# Patient Record
Sex: Male | Born: 1945 | Race: White | Hispanic: No | Marital: Married | Smoking: Former smoker
Health system: Southern US, Community
[De-identification: ages and names within clinical notes are randomized; demographics above are authoritative.]

## PROBLEM LIST (undated history)

## (undated) ENCOUNTER — Emergency Department (HOSPITAL_BASED_OUTPATIENT_CLINIC_OR_DEPARTMENT_OTHER)

## (undated) DIAGNOSIS — T4145XA Adverse effect of unspecified anesthetic, initial encounter: Secondary | ICD-10-CM

## (undated) DIAGNOSIS — G894 Chronic pain syndrome: Secondary | ICD-10-CM

## (undated) DIAGNOSIS — Z9989 Dependence on other enabling machines and devices: Secondary | ICD-10-CM

## (undated) DIAGNOSIS — H9190 Unspecified hearing loss, unspecified ear: Secondary | ICD-10-CM

## (undated) DIAGNOSIS — I2510 Atherosclerotic heart disease of native coronary artery without angina pectoris: Secondary | ICD-10-CM

## (undated) DIAGNOSIS — R251 Tremor, unspecified: Secondary | ICD-10-CM

## (undated) DIAGNOSIS — N3001 Acute cystitis with hematuria: Secondary | ICD-10-CM

## (undated) DIAGNOSIS — I10 Essential (primary) hypertension: Secondary | ICD-10-CM

## (undated) DIAGNOSIS — N183 Chronic kidney disease, stage 3 unspecified: Secondary | ICD-10-CM

## (undated) DIAGNOSIS — Z950 Presence of cardiac pacemaker: Secondary | ICD-10-CM

## (undated) DIAGNOSIS — I679 Cerebrovascular disease, unspecified: Secondary | ICD-10-CM

## (undated) DIAGNOSIS — J189 Pneumonia, unspecified organism: Secondary | ICD-10-CM

## (undated) DIAGNOSIS — R600 Localized edema: Secondary | ICD-10-CM

## (undated) DIAGNOSIS — R5383 Other fatigue: Secondary | ICD-10-CM

## (undated) DIAGNOSIS — G4733 Obstructive sleep apnea (adult) (pediatric): Secondary | ICD-10-CM

## (undated) DIAGNOSIS — M1710 Unilateral primary osteoarthritis, unspecified knee: Secondary | ICD-10-CM

## (undated) DIAGNOSIS — R519 Headache, unspecified: Secondary | ICD-10-CM

## (undated) DIAGNOSIS — E119 Type 2 diabetes mellitus without complications: Secondary | ICD-10-CM

## (undated) DIAGNOSIS — I447 Left bundle-branch block, unspecified: Secondary | ICD-10-CM

## (undated) DIAGNOSIS — J42 Unspecified chronic bronchitis: Secondary | ICD-10-CM

## (undated) DIAGNOSIS — Z789 Other specified health status: Secondary | ICD-10-CM

## (undated) DIAGNOSIS — Z87442 Personal history of urinary calculi: Secondary | ICD-10-CM

## (undated) DIAGNOSIS — I200 Unstable angina: Secondary | ICD-10-CM

## (undated) DIAGNOSIS — I5042 Chronic combined systolic (congestive) and diastolic (congestive) heart failure: Secondary | ICD-10-CM

## (undated) DIAGNOSIS — R748 Abnormal levels of other serum enzymes: Secondary | ICD-10-CM

## (undated) DIAGNOSIS — I499 Cardiac arrhythmia, unspecified: Secondary | ICD-10-CM

## (undated) DIAGNOSIS — I255 Ischemic cardiomyopathy: Secondary | ICD-10-CM

## (undated) DIAGNOSIS — M75100 Unspecified rotator cuff tear or rupture of unspecified shoulder, not specified as traumatic: Secondary | ICD-10-CM

## (undated) DIAGNOSIS — I495 Sick sinus syndrome: Secondary | ICD-10-CM

## (undated) DIAGNOSIS — M179 Osteoarthritis of knee, unspecified: Secondary | ICD-10-CM

## (undated) DIAGNOSIS — I872 Venous insufficiency (chronic) (peripheral): Secondary | ICD-10-CM

## (undated) DIAGNOSIS — H269 Unspecified cataract: Secondary | ICD-10-CM

## (undated) DIAGNOSIS — R51 Headache: Secondary | ICD-10-CM

## (undated) DIAGNOSIS — N200 Calculus of kidney: Secondary | ICD-10-CM

## (undated) DIAGNOSIS — Z86010 Personal history of colonic polyps: Secondary | ICD-10-CM

## (undated) DIAGNOSIS — I219 Acute myocardial infarction, unspecified: Secondary | ICD-10-CM

## (undated) DIAGNOSIS — T8859XA Other complications of anesthesia, initial encounter: Secondary | ICD-10-CM

## (undated) DIAGNOSIS — B9681 Helicobacter pylori [H. pylori] as the cause of diseases classified elsewhere: Secondary | ICD-10-CM

## (undated) DIAGNOSIS — Z9289 Personal history of other medical treatment: Secondary | ICD-10-CM

## (undated) DIAGNOSIS — M1990 Unspecified osteoarthritis, unspecified site: Secondary | ICD-10-CM

## (undated) DIAGNOSIS — I480 Paroxysmal atrial fibrillation: Secondary | ICD-10-CM

## (undated) DIAGNOSIS — G5600 Carpal tunnel syndrome, unspecified upper limb: Secondary | ICD-10-CM

## (undated) DIAGNOSIS — T7840XA Allergy, unspecified, initial encounter: Secondary | ICD-10-CM

## (undated) DIAGNOSIS — K2970 Gastritis, unspecified, without bleeding: Secondary | ICD-10-CM

## (undated) DIAGNOSIS — E785 Hyperlipidemia, unspecified: Secondary | ICD-10-CM

## (undated) DIAGNOSIS — E6609 Other obesity due to excess calories: Secondary | ICD-10-CM

## (undated) DIAGNOSIS — J45909 Unspecified asthma, uncomplicated: Secondary | ICD-10-CM

## (undated) DIAGNOSIS — I509 Heart failure, unspecified: Secondary | ICD-10-CM

## (undated) DIAGNOSIS — Z7901 Long term (current) use of anticoagulants: Secondary | ICD-10-CM

## (undated) DIAGNOSIS — D509 Iron deficiency anemia, unspecified: Secondary | ICD-10-CM

## (undated) HISTORY — DX: Chronic combined systolic (congestive) and diastolic (congestive) heart failure: I50.42

## (undated) HISTORY — DX: Acute myocardial infarction, unspecified: I21.9

## (undated) HISTORY — DX: Chronic pain syndrome: G89.4

## (undated) HISTORY — DX: Other specified health status: Z78.9

## (undated) HISTORY — DX: Paroxysmal atrial fibrillation: I48.0

## (undated) HISTORY — DX: Acute cystitis with hematuria: N30.01

## (undated) HISTORY — PX: KNEE ARTHROSCOPY: SHX127

## (undated) HISTORY — DX: Atherosclerotic heart disease of native coronary artery without angina pectoris: I25.10

## (undated) HISTORY — DX: Helicobacter pylori (H. pylori) as the cause of diseases classified elsewhere: B96.81

## (undated) HISTORY — PX: COLONOSCOPY: SHX174

## (undated) HISTORY — PX: UPPER GASTROINTESTINAL ENDOSCOPY: SHX188

## (undated) HISTORY — DX: Osteoarthritis of knee, unspecified: M17.9

## (undated) HISTORY — DX: Other fatigue: R53.83

## (undated) HISTORY — DX: Gastritis, unspecified, without bleeding: K29.70

## (undated) HISTORY — DX: Essential (primary) hypertension: I10

## (undated) HISTORY — DX: Long term (current) use of anticoagulants: Z79.01

## (undated) HISTORY — DX: Sick sinus syndrome: I49.5

## (undated) HISTORY — DX: Venous insufficiency (chronic) (peripheral): I87.2

## (undated) HISTORY — DX: Cerebrovascular disease, unspecified: I67.9

## (undated) HISTORY — DX: Localized edema: R60.0

## (undated) HISTORY — DX: Unspecified cataract: H26.9

## (undated) HISTORY — DX: Left bundle-branch block, unspecified: I44.7

## (undated) HISTORY — DX: Hyperlipidemia, unspecified: E78.5

## (undated) HISTORY — DX: Type 2 diabetes mellitus without complications: E11.9

## (undated) HISTORY — DX: Calculus of kidney: N20.0

## (undated) HISTORY — DX: Personal history of colonic polyps: Z86.010

## (undated) HISTORY — DX: Tremor, unspecified: R25.1

## (undated) HISTORY — DX: Allergy, unspecified, initial encounter: T78.40XA

## (undated) HISTORY — DX: Obstructive sleep apnea (adult) (pediatric): G47.33

## (undated) HISTORY — DX: Other obesity due to excess calories: E66.09

## (undated) HISTORY — PX: CORONARY ANGIOPLASTY WITH STENT PLACEMENT: SHX49

## (undated) HISTORY — DX: Unilateral primary osteoarthritis, unspecified knee: M17.10

## (undated) HISTORY — DX: Abnormal levels of other serum enzymes: R74.8

---
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
## 1978-11-22 HISTORY — PX: KNEE CARTILAGE SURGERY: SHX688

## 1998-04-03 ENCOUNTER — Encounter: Admission: RE | Admit: 1998-04-03 | Discharge: 1998-07-02 | Payer: Self-pay | Admitting: Cardiovascular Disease

## 2000-02-05 ENCOUNTER — Encounter: Payer: Self-pay | Admitting: Family Medicine

## 2000-02-05 ENCOUNTER — Encounter: Admission: RE | Admit: 2000-02-05 | Discharge: 2000-02-05 | Payer: Self-pay | Admitting: Family Medicine

## 2000-10-07 ENCOUNTER — Encounter: Admission: RE | Admit: 2000-10-07 | Discharge: 2000-10-07 | Payer: Self-pay | Admitting: Family Medicine

## 2000-10-07 ENCOUNTER — Encounter: Payer: Self-pay | Admitting: Family Medicine

## 2001-07-25 ENCOUNTER — Inpatient Hospital Stay (HOSPITAL_COMMUNITY): Admission: EM | Admit: 2001-07-25 | Discharge: 2001-07-27 | Payer: Self-pay | Admitting: Emergency Medicine

## 2001-08-04 ENCOUNTER — Emergency Department (HOSPITAL_COMMUNITY): Admission: EM | Admit: 2001-08-04 | Discharge: 2001-08-04 | Payer: Self-pay | Admitting: Emergency Medicine

## 2001-08-04 ENCOUNTER — Encounter: Payer: Self-pay | Admitting: Emergency Medicine

## 2001-08-06 ENCOUNTER — Emergency Department (HOSPITAL_COMMUNITY): Admission: EM | Admit: 2001-08-06 | Discharge: 2001-08-06 | Payer: Self-pay | Admitting: Emergency Medicine

## 2001-08-06 ENCOUNTER — Encounter: Payer: Self-pay | Admitting: Emergency Medicine

## 2002-07-31 ENCOUNTER — Encounter: Admission: RE | Admit: 2002-07-31 | Discharge: 2002-07-31 | Payer: Self-pay | Admitting: Urology

## 2002-07-31 ENCOUNTER — Encounter: Payer: Self-pay | Admitting: Urology

## 2006-09-22 DIAGNOSIS — Z86010 Personal history of colonic polyps: Secondary | ICD-10-CM

## 2006-09-22 HISTORY — DX: Personal history of colonic polyps: Z86.010

## 2006-09-26 ENCOUNTER — Encounter: Payer: Self-pay | Admitting: Internal Medicine

## 2007-09-25 ENCOUNTER — Ambulatory Visit (HOSPITAL_COMMUNITY): Admission: RE | Admit: 2007-09-25 | Discharge: 2007-09-26 | Payer: Self-pay | Admitting: Cardiovascular Disease

## 2008-01-02 ENCOUNTER — Ambulatory Visit: Payer: Self-pay | Admitting: Internal Medicine

## 2008-01-03 DIAGNOSIS — E785 Hyperlipidemia, unspecified: Secondary | ICD-10-CM | POA: Insufficient documentation

## 2008-01-03 DIAGNOSIS — Z87442 Personal history of urinary calculi: Secondary | ICD-10-CM | POA: Insufficient documentation

## 2008-01-03 DIAGNOSIS — E669 Obesity, unspecified: Secondary | ICD-10-CM

## 2008-01-03 DIAGNOSIS — I2510 Atherosclerotic heart disease of native coronary artery without angina pectoris: Secondary | ICD-10-CM | POA: Insufficient documentation

## 2008-01-03 DIAGNOSIS — I119 Hypertensive heart disease without heart failure: Secondary | ICD-10-CM | POA: Insufficient documentation

## 2008-01-03 DIAGNOSIS — IMO0002 Reserved for concepts with insufficient information to code with codable children: Secondary | ICD-10-CM | POA: Insufficient documentation

## 2008-01-03 DIAGNOSIS — M1710 Unilateral primary osteoarthritis, unspecified knee: Secondary | ICD-10-CM

## 2008-01-03 DIAGNOSIS — J449 Chronic obstructive pulmonary disease, unspecified: Secondary | ICD-10-CM | POA: Insufficient documentation

## 2008-01-03 HISTORY — DX: Obesity, unspecified: E66.9

## 2008-01-16 ENCOUNTER — Ambulatory Visit: Payer: Self-pay | Admitting: Internal Medicine

## 2008-03-21 ENCOUNTER — Telehealth (INDEPENDENT_AMBULATORY_CARE_PROVIDER_SITE_OTHER): Payer: Self-pay | Admitting: *Deleted

## 2008-04-21 ENCOUNTER — Emergency Department (HOSPITAL_COMMUNITY): Admission: EM | Admit: 2008-04-21 | Discharge: 2008-04-22 | Payer: Self-pay | Admitting: Emergency Medicine

## 2008-04-30 ENCOUNTER — Ambulatory Visit: Payer: Self-pay | Admitting: Internal Medicine

## 2008-05-07 ENCOUNTER — Ambulatory Visit (HOSPITAL_COMMUNITY): Admission: RE | Admit: 2008-05-07 | Discharge: 2008-05-07 | Payer: Self-pay | Admitting: Internal Medicine

## 2008-05-21 ENCOUNTER — Encounter: Payer: Self-pay | Admitting: Internal Medicine

## 2008-05-23 ENCOUNTER — Encounter: Payer: Self-pay | Admitting: Internal Medicine

## 2008-05-29 ENCOUNTER — Telehealth: Payer: Self-pay | Admitting: Internal Medicine

## 2008-07-22 ENCOUNTER — Telehealth: Payer: Self-pay | Admitting: Internal Medicine

## 2008-08-24 ENCOUNTER — Inpatient Hospital Stay (HOSPITAL_COMMUNITY): Admission: EM | Admit: 2008-08-24 | Discharge: 2008-08-29 | Payer: Self-pay | Admitting: Emergency Medicine

## 2008-10-05 ENCOUNTER — Observation Stay (HOSPITAL_COMMUNITY): Admission: EM | Admit: 2008-10-05 | Discharge: 2008-10-06 | Payer: Self-pay | Admitting: Emergency Medicine

## 2008-12-14 ENCOUNTER — Observation Stay (HOSPITAL_COMMUNITY): Admission: EM | Admit: 2008-12-14 | Discharge: 2008-12-14 | Payer: Self-pay | Admitting: *Deleted

## 2009-06-15 ENCOUNTER — Inpatient Hospital Stay (HOSPITAL_COMMUNITY): Admission: AD | Admit: 2009-06-15 | Discharge: 2009-06-17 | Payer: Self-pay | Admitting: Urology

## 2009-06-15 ENCOUNTER — Encounter: Payer: Self-pay | Admitting: Emergency Medicine

## 2009-06-26 ENCOUNTER — Observation Stay (HOSPITAL_COMMUNITY): Admission: RE | Admit: 2009-06-26 | Discharge: 2009-06-27 | Payer: Self-pay | Admitting: Urology

## 2009-08-21 ENCOUNTER — Encounter: Payer: Self-pay | Admitting: Internal Medicine

## 2010-02-16 ENCOUNTER — Ambulatory Visit: Payer: Self-pay | Admitting: Internal Medicine

## 2010-02-18 DIAGNOSIS — Z860101 Personal history of adenomatous and serrated colon polyps: Secondary | ICD-10-CM

## 2010-02-18 DIAGNOSIS — Z86010 Personal history of colonic polyps: Secondary | ICD-10-CM | POA: Insufficient documentation

## 2010-02-18 HISTORY — DX: Personal history of adenomatous and serrated colon polyps: Z86.0101

## 2010-12-22 NOTE — Procedures (Signed)
Summary: Griffith Citron MD  Griffith Citron MD   Imported By: Lester Calvert 02/23/2010 08:32:02  _____________________________________________________________________  External Attachment:    Type:   Image     Comment:   External Document

## 2010-12-22 NOTE — Assessment & Plan Note (Signed)
Summary: discuss colon on BT--ch.   History of Present Illness Visit Type: Follow-up Visit Primary GI MD: Stan Head MD [facility] Primary Provider: Reather Littler, MD Chief Complaint: Colon screening discussion History of Present Illness:   65 yo white man seen previously for dysphagia, he had an EGD and a barium swallow but was nor dilated due to ongoing Plavix use. The dysphagia has resolved. He also had a history of colon polyp but when he was last seen we did not have the colonoscopy and pathology report available. He has no gastrointestinal complaints today but is here to review prior (2007) colonoscopy findings.            Colonoscopy  Procedure date:  09/26/2006  Findings:      Polyp - 15 mm pedunculated hepatic flexure PATHOLOGY = inflammatory polyp  Comments:      Repeat colonoscopy in 5 years.   EGD  Procedure date:  01/16/2008  Findings:      1) 3 CM HIATAL HERNIA 2) ANTRAL GASTRITIS (EROSIONS), LIKELY FROM ASA. H. PYLORI BIOPSY TAKEN 2) OTHERWISE OK  Barium Swallow  Procedure date:  05/07/2008  Findings:      Small hiatal hernia Mild esophageal dysmotility  Procedures Next Due Date:    Colonoscopy: 09/2011   Current Medications (verified): 1)  Actoplus Met 15-850 Mg  Tabs (Pioglitazone Hcl-Metformin Hcl) .... Take 1 Tablet By Mouth Two Times A Day 2)  Glimepiride 4 Mg  Tabs (Glimepiride) .... Take 2 Tablets Before Supper 3)  Lisinopril 20 Mg  Tabs (Lisinopril) .... Take 1 Tablet By Mouth Once A Day 4)  Simvastatin 80 Mg  Tabs (Simvastatin) .... Take 1 Tablet By Mouth Once A Day 5)  Hydrochlorothiazide 25 Mg  Tabs (Hydrochlorothiazide) .... Take 1 Tablet By Mouth Once A Day 6)  Warfarin Sodium 5 Mg Tabs (Warfarin Sodium) .... Take Daily As Directed 7)  Cartia Xt 120 Mg Xr24h-Cap (Diltiazem Hcl Coated Beads) .... Once Daily 8)  Aspirin 325 Mg  Tabs (Aspirin) .... Take 1/2 Tablet By Mouth Once Daily 9)  Sotalol Hcl (Af) 120 Mg Tabs (Sotalol Hcl Af) ....  Two Times A Day  Allergies (verified): 1)  ! Codeine 2)  ! Terramycin  Past History:  Past Medical History: ? of SLEEP APNEA (ICD-780.57) NEPHROLITHIASIS, HX OF (ICD-V13.01) OSTEOARTHRITIS, KNEE (ICD-715.96) DYSLIPIDEMIA (ICD-272.4) DIABETES MELLITUS, TYPE II (ICD-250.00) OBESITY (ICD-278.00) BRONCHITIS, OBSTRUCTIVE CHRONIC (ICD-491.20) CORONARY ARTERY DISEASE, S/P PTCA (ICD-414.9) HYPERTENSION, BENIGN (ICD-401.1) COLON POLYP (INFLAMMATORY) 2007  Past Surgical History: Intestinal resection age 71 (?strep infection) Appendectomy age 69 Bilateral Knee surgery x 3 PTCA, multiple. Last 11/08 with drug-eluting stent x7 Pacemaker  Nephrolithiasis - cystoureteroscopy and extraction/lithotripsy Annabell Howells) 06/26/2009  Family History: Reviewed history from 04/30/2008 and no changes required. No FH of Colon Cancer ?Family History of Clotting disorder: Mother? Family History of Heart Disease: mother and uncles   Social History: Reviewed history from 04/30/2008 and no changes required. Occupation: Freight forwarder for [employer] , previously Systems developer Patient is a former smoker. -stopped age 65 Alcohol Use - yes-on occasion Daily Caffeine Use-2 cups Illicit Drug Use - no Patient gets regular exercise.  Review of Systems       The patient complains of allergy/sinus, arthritis/joint pain, back pain, heart rhythm changes, muscle pains/cramps, shortness of breath, sleeping problems, swelling of feet/legs, and urine leakage.    Vital Signs:  Patient profile:   65 year old male Height:      69 inches Weight:  304.38 pounds BMI:     45.11 Pulse rate:   72 / minute Pulse rhythm:   regular BP sitting:   134 / 72  (left arm) Cuff size:   large  Vitals Entered By: June McMurray CMA Duncan Dull) (February 16, 2010 9:20 AM)  Physical Exam  General:  obese.  NAD   Impression & Recommendations:  Problem # 1:  COLONIC POLYPS, HX OF (ICD-V12.72) Assessment Comment  Only Inflammatory polyp (15mm) removed 11/07 Since this was a larger right-sided polyp, even though not clearly pre-cancerous, recommend a repeat colonoscopy 09/2011, 5 years after removal. explained rationale and plans to patient.  Problem # 2:  OTHER DYSPHAGIA (ICD-787.29) Assessment: Improved resolved has had an EGD and barium swallow - "mild esophageal dysmotility" he is asymptomatic - nothing further at this time  Patient Instructions: 1)  You should have a repeat routine colonoscopy in Noveber 2012. 2)  If you develop rectal bleeding, change in bowels or other gastrointestinal problems before then let us know. 3)  Copy sent to : Burnell Blanks, MD, Susa Griffins, MD 4)  The medication list was reviewed and reconciled.  All changed / newly prescribed medications were explained.  A complete medication list was provided to the patient / caregiver.

## 2011-02-27 LAB — BASIC METABOLIC PANEL
BUN: 20 mg/dL (ref 6–23)
Chloride: 104 mEq/L (ref 96–112)
Creatinine, Ser: 1.11 mg/dL (ref 0.4–1.5)
Glucose, Bld: 160 mg/dL — ABNORMAL HIGH (ref 70–99)
Potassium: 4.4 mEq/L (ref 3.5–5.1)

## 2011-02-27 LAB — GLUCOSE, CAPILLARY
Glucose-Capillary: 101 mg/dL — ABNORMAL HIGH (ref 70–99)
Glucose-Capillary: 137 mg/dL — ABNORMAL HIGH (ref 70–99)
Glucose-Capillary: 141 mg/dL — ABNORMAL HIGH (ref 70–99)
Glucose-Capillary: 228 mg/dL — ABNORMAL HIGH (ref 70–99)

## 2011-02-28 LAB — CBC
Hemoglobin: 11.9 g/dL — ABNORMAL LOW (ref 13.0–17.0)
MCHC: 33.6 g/dL (ref 30.0–36.0)
MCV: 89.1 fL (ref 78.0–100.0)
RBC: 3.82 MIL/uL — ABNORMAL LOW (ref 4.22–5.81)
RBC: 3.88 MIL/uL — ABNORMAL LOW (ref 4.22–5.81)

## 2011-02-28 LAB — GLUCOSE, CAPILLARY
Glucose-Capillary: 125 mg/dL — ABNORMAL HIGH (ref 70–99)
Glucose-Capillary: 137 mg/dL — ABNORMAL HIGH (ref 70–99)
Glucose-Capillary: 138 mg/dL — ABNORMAL HIGH (ref 70–99)

## 2011-02-28 LAB — DIFFERENTIAL
Basophils Relative: 0 % (ref 0–1)
Eosinophils Relative: 1 % (ref 0–5)
Monocytes Absolute: 0.6 10*3/uL (ref 0.1–1.0)
Neutro Abs: 6.4 10*3/uL (ref 1.7–7.7)
Neutrophils Relative %: 82 % — ABNORMAL HIGH (ref 43–77)

## 2011-02-28 LAB — COMPREHENSIVE METABOLIC PANEL
ALT: 12 U/L (ref 0–53)
BUN: 18 mg/dL (ref 6–23)
CO2: 27 mEq/L (ref 19–32)
GFR calc non Af Amer: 37 mL/min — ABNORMAL LOW (ref >60)
Glucose, Bld: 146 mg/dL — ABNORMAL HIGH (ref 70–99)
Potassium: 4.4 mEq/L (ref 3.5–5.1)
Sodium: 136 mEq/L (ref 135–145)

## 2011-02-28 LAB — BASIC METABOLIC PANEL
CO2: 30 mEq/L (ref 19–32)
Calcium: 9.1 mg/dL (ref 8.4–10.5)
Creatinine, Ser: 1.41 mg/dL (ref 0.4–1.5)
GFR calc Af Amer: >60 mL/min (ref >60)
GFR calc non Af Amer: 51 mL/min — ABNORMAL LOW (ref >60)

## 2011-02-28 LAB — PROTIME-INR
INR: 5.9 (ref 0.00–1.49)
INR: 1.7 — ABNORMAL HIGH (ref 0.00–1.49)
INR: 6.2 (ref 0.00–1.49)
Prothrombin Time: 21 seconds — ABNORMAL HIGH (ref 11.6–15.2)

## 2011-02-28 LAB — URINALYSIS, ROUTINE W REFLEX MICROSCOPIC
Bilirubin Urine: NEGATIVE
Glucose, UA: NEGATIVE mg/dL
Hgb urine dipstick: NEGATIVE
Specific Gravity, Urine: 1.018 (ref 1.005–1.030)

## 2011-02-28 LAB — APTT
aPTT: 132 seconds — ABNORMAL HIGH (ref 24–37)
aPTT: 52 seconds — ABNORMAL HIGH (ref 24–37)

## 2011-02-28 LAB — PREPARE FRESH FROZEN PLASMA

## 2011-03-08 LAB — BASIC METABOLIC PANEL
BUN: 20 mg/dL (ref 6–23)
BUN: 22 mg/dL (ref 6–23)
Calcium: 8.8 mg/dL (ref 8.4–10.5)
Chloride: 102 mEq/L (ref 96–112)
GFR calc non Af Amer: >60 mL/min (ref >60)
GFR calc non Af Amer: >60 mL/min (ref >60)
Glucose, Bld: 103 mg/dL — ABNORMAL HIGH (ref 70–99)
Glucose, Bld: 122 mg/dL — ABNORMAL HIGH (ref 70–99)
Potassium: 4.2 mEq/L (ref 3.5–5.1)

## 2011-03-08 LAB — POCT CARDIAC MARKERS
CKMB, poc: 1.5 ng/mL (ref 1.0–8.0)
Troponin i, poc: <0.05 ng/mL (ref 0.00–0.09)

## 2011-03-08 LAB — POCT I-STAT, CHEM 8
Chloride: 105 mEq/L (ref 96–112)
HCT: 39 % (ref 39.0–52.0)
Potassium: 4.2 mEq/L (ref 3.5–5.1)

## 2011-03-08 LAB — CBC
HCT: 37.1 % — ABNORMAL LOW (ref 39.0–52.0)
HCT: 37.5 % — ABNORMAL LOW (ref 39.0–52.0)
MCV: 88.7 fL (ref 78.0–100.0)
Platelets: 172 10*3/uL (ref 150–400)
Platelets: 196 10*3/uL (ref 150–400)
RDW: 14.3 % (ref 11.5–15.5)
RDW: 14.4 % (ref 11.5–15.5)

## 2011-03-08 LAB — APTT
aPTT: 35 seconds (ref 24–37)
aPTT: 37 seconds (ref 24–37)

## 2011-03-08 LAB — TROPONIN I: Troponin I: 0.02 ng/mL (ref 0.00–0.06)

## 2011-03-08 LAB — PROTIME-INR: Prothrombin Time: 26.1 seconds — ABNORMAL HIGH (ref 11.6–15.2)

## 2011-03-08 LAB — CARDIAC PANEL(CRET KIN+CKTOT+MB+TROPI)
Total CK: 134 U/L (ref 7–232)
Troponin I: <0.01 ng/mL (ref 0.00–0.06)

## 2011-03-08 LAB — LIPID PANEL
LDL Cholesterol: 59 mg/dL (ref 0–99)
Triglycerides: 97 mg/dL (ref <150)

## 2011-04-06 NOTE — Consult Note (Signed)
NAME:  Charles [name], Charles [name] NO.:  [id number]   MEDICAL RECORD NO.:  [id number]          PATIENT TYPE:  INP   LOCATION:  1538                         FACILITY:  [facility]   PHYSICIAN:  Sheliah Mends, MD      DATE OF BIRTH:  [birth year]   DATE OF CONSULTATION:  06/15/2009  DATE OF DISCHARGE:                                 CONSULTATION   REQUESTING PHYSICIAN:  Vesta Mixer, M.D.   REASON FOR CONSULTATION:  Preoperative evaluation prior to urethral  stent placement.   HISTORY OF PRESENT ILLNESS:  Charles [name] is a 65 year old gentleman who  is followed by Dr. Alanda Amass for history of coronary artery disease,  paroxysmal atrial fibrillation, history of pacemaker placement, and  obstructive sleep apnea managed on CPAP.   Charles [name] has a history of known coronary artery disease with multiple  prior interventions including LAD stenting, obtuse marginal and  circumflex stenting as well as stent placement to the right coronary  artery.  His last cardiac catheterization was in October 2008 which  showed a high-grade proximal circumflex lesion.  This was stented with a  drug eluding Promus stent on September 25, 2007.  He has done quite well  without significant recurrence of his angina.  In addition, Charles [name]  has been diagnosed with sick sinus syndrome and paroxysmal atrial  fibrillation.  He underwent permanent pacemaker placement for  symptomatic bradycardia on December 31, 2008.  He has very little atrial  fibrillation burden but due to high CHADS2 score he has been managed on  anticoagulation with Coumadin for prevention of thromboembolic stroke.   In addition, Charles [name] has a history of obstructive sleep apnea and was  started recently on CPAP ventilation.  He has felt much better since  start of CPAP ventilation.   Charles [name] is now admitted to [facility]  with a diagnosis of  kidney and urethral stones for a planned urethral stent placement.  The  cardiology service was asked to help manage his anticoagulation issues  arising from his history of paroxysmal atrial fibrillation as well as  prior drug-eluding stent placement.   PAST MEDICAL HISTORY:  1. Coronary artery disease with history of multiple percutaneous      coronary intervention including LAD on left circumflex and right      coronary artery with last stent placement to the circumflex with a      drug eluding stent on September 25, 2007.  2. Paroxysmal atrial fibrillation.  3. Sick sinus syndrome, status post pacemaker placement on December 31, 2008.  4. Hypertension.  5. Obstructive sleep apnea on CPAP ventilation.  6. Type 2 diabetes mellitus.  7. Morbid obesity.  8. Hypertension.   OUTPATIENT MEDICATIONS:  1. Glyburide 8 mg at bedtime.  2. Lisinopril 5 mg p.o. daily.  3. Simvastatin 80 mg p.o. daily.  4. Hydrochlorothiazide 25 mg p.o. daily  5. Plavix 75 mg p.o. daily.  6. Coumadin as directed.  7. KCl 20 mEq. p.o. daily.  8. Metformin 750 mg p.o. daily  9. Cardizem CD 120 mg  p.o. daily.  10.Betapace 120 mg p.o. b.i.d.  11.Fish oil.  12.Vitamin C.   DEVICES:  Medtronic Zephyr XL implanted on December 31, 2008 with last  interrogation in May 2010 that showed small atrial fibrillation and  atrial flutter burden.   SOCIAL HISTORY:  Charles [name] is married and has one daughter.  He used to  work at the [employer] in [place] in the past and is now working at  an airport refueling planes.  He has a history of tobacco use 25 years  ago.   FAMILY HISTORY:  Noncontributory.   REVIEW OF SYSTEMS:  Primarily back pain, abdominal pain, nausea, and  vomiting.  No shortness of breath or chest pain.   PHYSICAL EXAMINATION:  GENERAL:  The patient is alert and oriented x3.  VITAL SIGNS:  Blood pressure 110/70, heart rate 70, respiratory rate 12,  temperature afebrile.  NECK:  Supple.  Normal JVP.  No carotid bruit.  CHEST/LUNGS:  Clear to auscultation  bilaterally.  No rales or wheezes.  HEART:  Regular rate and rhythm.  No rub, murmur, gallop.  ABDOMEN:  Soft, nontender, nondistended.  Positive bowel sounds.  EXTREMITIES:  No edema.   IMPRESSION:  1. Coronary artery disease, status post drug-eluding stent placement,      stable on aspirin and Plavix.  2. Hypertension, well-controlled.  3. Dyslipidemia.  4. Type 2 diabetes mellitus.  5. Obesity.  6. Paroxysmal atrial fibrillation and sick sinus syndrome, status post      pacemaker placement,, currently managed on Coumadin.  7. Obstructive sleep apnea on CPAP ventilation.   RECOMMENDATIONS:  Charles [name] has a low to intermediate risk for his  upcoming surgery.  In regards to his atrial fibrillation, his  anticoagulation with Coumadin will be reversed using vitamin K  injections.  He is currently in sinus rhythm.  This will reduce the  bleeding risk during surgery and should be overall safe.  Charles [name]  should be started on Coumadin at the earliest point post surgically.   In regards to his history of coronary artery disease and stent  placement,  I will hold dual antiplatelet therapy with aspirin and  Plavix.  Specifically Plavix will be held and the patient will be  continued on aspirin.  Given that his stent placement dates almost two  years back this should be a safe approach.   Charles [name] should be continued on CPAP ventilation and should be  monitored on telemetry after his surgery.   Should Charles [name] develop any fevers blood cultures should be obtained  given his history of device placement.  There is a low threshold for  antibiotic therapy should infection be suspected.     Thank you for this interesting consultation.  Please do not hesitate  to contact me should you have any questions or concerns.  We will follow  with you.  Thank you for allowing me to participate in the care of this  nice gentleman      Sheliah Mends, MD  Electronically Signed     JE/MEDQ   D:  06/15/2009  T:  06/15/2009  Job:  [id number]

## 2011-04-06 NOTE — Discharge Summary (Signed)
NAME:  Charles [name], SALTS NO.:  [id number]   MEDICAL RECORD NO.:  [id number]          PATIENT TYPE:  INP   LOCATION:  6533                         FACILITY:  [facility]   PHYSICIAN:  Lezlie Octave, N.P.     DATE OF BIRTH:  1946-01-17   DATE OF ADMISSION:  09/25/2007  DATE OF DISCHARGE:  09/26/2007                               DISCHARGE SUMMARY   HISTORY:  [name] is a 65 year old white married male patient who is  a patient of Dr. Susa Griffins.  He underwent a cardiac  catheterization on September 22, 2007 at [facility].  He had  progression of disease in the proximal AV grade circumflex with 85%  stenosis thought to be a cultured lesion causing him to have increased  anginal symptoms, thus, he came to the hospital on September 25, 2007 for  elective PCI of his new lesion.  He underwent 3.5x15 Promus DES stent to  his circumflex.  The following day on September 26, 2007, he was seen by  Dr. Tresa Endo.  His blood pressure was 104/50.  His potassium was 4.1, BUN  11, creatinine 1.0, hemoglobin 12.1, hematocrit 35.4.  CK was negative.  Troponin was 0.05.  He was felt to be stable for discharge and follow up  with Dr. Alanda Amass as an outpatient.  He was seen by cardiac rehab.  He  did not want cardiac rehab phase-II referral.   DISCHARGE MEDICATIONS:  1. Amaril 4 mg 2 every day.  2. HCTZ 12.5 mg every day.  3. Atenolol 25 mg every day.  4. Norvasc 10 mg every day.  5. Aspirin 81 mg every day.  6. Lisinopril 20 mg every day.  7. Simvastatin 80 mg every day.  8. Finasteride 5 mg every day.  9. Actos plus metformin 15/850 twice a day.  He is to hold that until      Wednesday night.  10.Plavix 75 mg every day.  11.Aspirin 81 mg two tabs daily.   DISCHARGE DIAGNOSES:  1. Progressive coronary artery disease status post cardiac catheter at      [facility] showing 85% lesion AVF groove circumflex.      This was a new lesion.  2. Prior atherosclerotic  cardiovascular disease with a history of      posterior myocardial infarction remote treated with emergency      percutaneous transluminal coronary angiography May 05, 1994.  He      had redilatation July 02, 1994.  He had recurrent angina in 1998      with high-speed rotational atherectomy of his mid-circumflex obtuse      marginal bifurcation with stenting of his left anterior descending,      all bare-metal stents.  In 2002, he had bare-metal stent to his      right coronary artery and diagonal III percutaneous transluminal      coronary angioplasty.  3. Morbid obesity.  4. Hyperlipidemia.  5. Adult-onset diabetes mellitus.  6. Benign prostatic hypertrophy.  7. Well preserved left ventricular function.      Lezlie Octave, [place].P.  BB/MEDQ  D:  09/26/2007  T:  09/26/2007  Job:  [id number]   cc:   Reather Littler, M.D.  Francisca December, M.D.

## 2011-04-06 NOTE — Op Note (Signed)
NAME:  Charles [name], Charles [name] NO.:  [id number]   MEDICAL RECORD NO.:  [id number]          PATIENT TYPE:  INP   LOCATION:  1421                         FACILITY:  [facility]   PHYSICIAN:  Excell Seltzer. Annabell Howells, M.D.    DATE OF BIRTH:  18-Feb-1946   DATE OF PROCEDURE:  06/16/2009  DATE OF DISCHARGE:                               OPERATIVE REPORT   PROCEDURE:  Cystoscopy, left retrograde pyelogram with interpretation  and insertion of left double-J stent.   PREOPERATIVE DIAGNOSIS:  Left proximal ureteral stone.   POSTOPERATIVE DIAGNOSIS:  Left proximal ureteral stone.   SURGEON:  Dr. Bjorn Pippin.   ANESTHESIA:  General.   DRAINS:  6-French x 26 cm double-J stent.   COMPLICATIONS:  None.   INDICATIONS:  [name] is a 65 year old white male with multiple  medical problems including atrial fibrillation that is managed with  Coumadin, who presented yesterday with left flank pain and was found to  have a large left proximal stone with hydronephrosis.  He is to undergo  stenting for relief of pain but will required delayed definitive  treatment because of his coagulation status prior to the procedure.  He  was given FFP which brought his INR down to approximately 2/   FINDINGS AND PROCEDURE:  The patient was taken operating room.  He  received 400 mg Cipro.  General anesthetic was induced.  He was placed  in lithotomy position.  His perineum and genitalia were prepped Betadine  solution.  He was draped in the usual sterile fashion.  Cystoscopy was  performed using 22-French scope and 12 and 70 degrees lenses.  Examination revealed a normal urethra.  The external sphincter was  intact.  The prostatic urethra ws short without obstruction.  Examination of the bladder revealed mild trabeculation.  There was some  blood in the dependent portion of the bladder.  Ureteral orifices were  unremarkable.  No tumors or stones were noted.   The left ureteral orifice was cannulated with 5-French  open-end catheter  and contrast was instilled.   Left retrograde pyelogram revealed a normal distal and mid ureter.  In  the proximal ureter there was a filling defect consistent with his 6 x  11 mm stone.  With further instillation of contrast, the stone flushed  back into the kidney.  The kidney was only mildly dilated.  There were  additional filling defects that were most consistent with small clots in  the renal pelvis and in the calyces.   After completion of the retrograde pyelogram a guidewire was placed to  the kidney and a 6-French 26 cm double-J stent was placed without  difficulty under fluoroscopic guidance to the kidney.  The wire was  removed leaving good coil in the kidney and good coil in the bladder.  Upon placing the stent, there was efflux of turbid bloody urine from the  left collecting system.   The bladder was drained.  The cystoscope was removed, the patient was  taken down from lithotomy position.  His anesthetic was reversed.  He  was removed to the recovery room in stable  condition.  There no  complications.      Excell Seltzer. Annabell Howells, M.D.  Electronically Signed     JJW/MEDQ  D:  06/16/2009  T:  06/17/2009  Job:  [id number]

## 2011-04-06 NOTE — Assessment & Plan Note (Signed)
[facility]                         GASTROENTEROLOGY OFFICE NOTE   CONN, TROMBETTA                   MRN:          [id number]  DATE:01/02/2008                            DOB:          [birth year]    REQUESTING PHYSICIAN:  Lianne Bushy, M.D.   REASON FOR CONSULTATION:  Dysphagia   ASSESSMENT:  A [age]-year-old white man with intermittent dysphagia,  who recently had a flare of dysphagia and severe heartburn symptoms.  He  takes Plavix for a drug eluting stent, and the drug eluting stent was  placed in November of 2008, so it is too early to hold his Plavix.   PLAN:  1. Investigate with upper GI endoscopy while he is on Plavix and      aspirin.  Biopsy would be reasonable.  Would not dilate his      esophagus due to the ongoing Plavix and aspirin.  2. Risks, benefits and indications have been explained.  He      understands and agrees to proceed.   HISTORY:  [age]-year-old white man who has had intermittent problems  with mashed potatoes, bread and solid food dysphagia problems.  It has  been intermittent.  He has not been on a proton pump inhibitor.  He does  not have underlying heartburn and indigestion.  However, about three  weeks ago, he was eating, noticed bloating, and some vague early satiety-  like symptoms, a lot belching and burning, and then he developed  problems where he felt like he could not swallow well, though I am not  sure he had a food impaction based upon the history.  He saw Dr. Purnell Shoemaker,  he got GI cocktail and he felt somewhat better.  He was given Protonix  for a week and the indigestion type symptoms subsided.  He has still had  these swallowing problems described above with a mid sternal sticking  point.   GASTROINTESTINAL REVIEW OF SYSTEMS:  Otherwise negative.   PAST MEDICAL HISTORY:  1. Colonoscopy 2007, Dr. Kinnie Scales.  Apparently he had one polyp and was      told to follow up in five years.  I do not  have those records.  2. Hypertension.  3. Myocardial infarction, coronary artery disease, most recently      having angioplasty with drug eluting stent November 2008.  He is a      patient of Dr. Kandis Cocking.  4. Chronic bronchitis.  5. Type 2 diabetes mellitus.  6. Obesity.  7. Dyslipidemia.  8. Osteoarthritis.  9. History of nephrolithiasis.  10.Sleep apnea.  11.Prior knee surgery.  12.Status post intestinal resection due to streptococcal infection age      51.  13.Appendectomy age 29.   FAMILY HISTORY:  Uncles and mother have heart disease.  Nephew has  alcoholism.  No colon cancer.   SOCIAL HISTORY:  Married.  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  One daughter.  No alcohol, tobacco or drugs except for  occasional social alcohol.   REVIEW OF SYSTEMS:  See my medical history form for full details.  He  has been somewhat tired  lately.  An overactive bladder is a problem as  well.   PHYSICAL EXAMINATION:  Reveals a well developed, well nourished, obese  white man.  Weight 293 pounds.  Pulse 78, blood pressure 120/62.  EYES:  Anicteric.  MOUTH:  Posterior pharynx free of lesions.  NECK:  Supple without thyromegaly or mass.  CHEST:  Clear, resonant.  HEART:  S1, S2.  No murmurs, gallops.  ABDOMEN:  Soft, obese, nontender.  No organomegaly or mass.  There is no  gynecomastia noted.  LYMPH AIC:  No neck or supraclavicular nodes.  Lower extremities how 1+ bilateral edema.  SKIN:  Warm and dry without rash.  PSYCH:  He is alert and oriented x3.  NEURO:  Cranial nerves II-XII intact.   I appreciate the opportunity to care for this patient.     Iva Boop, MD,FACG  Electronically Signed    CEG/MedQ  DD: 01/02/2008  DT: 01/03/2008  Job #: [id number]   cc:   Lianne Bushy, M.D.  Richard A. Alanda Amass, M.D.  Reather Littler, M.D.

## 2011-04-06 NOTE — H&P (Signed)
NAME:  Charles [name], Charles [name] NO.:  [id number]   MEDICAL RECORD NO.:  [id number]          PATIENT TYPE:  INP   LOCATION:  2926                         FACILITY:  [facility]   PHYSICIAN:  Nanetta Batty, M.D.   DATE OF BIRTH:  [birth year]   DATE OF ADMISSION:  10/05/2008  DATE OF DISCHARGE:                              HISTORY & PHYSICAL   HISTORY OF PRESENT ILLNESS:  Rapid irregular heart rate with shortness  of breath and jaw aching.   HISTORY OF PRESENT ILLNESS:  A 65 year old white married male,  cardiology patient of Dr. Alanda Amass, with a history of coronary disease  and multiple stents, also recent permanent transvenous pacemaker placed  October of 2009.  The patient has paroxysmal Afib and today he was  awakened from sleep at around 1:00 or 1:30 in the morning because of  increased heart rate. He was short of breath.  He felt weak and had some  aching in his jaws. He felt it was better when he finally got ready for  work, and then he called [place] at from his workplace complaining of this  his jaws aching, and he did feel as if he was not as uncomfortable as he  had  been, but he was weak and anxious and just uncomfortable with this  irregular rhythm.  He was instructed to come the emergency room which he  did. Here he was found to be in Aflutter.  He was given 20 mg IV  Cardizem. Blood pressure was stable at that time at 135 systolic, and he  slowed to a rate of 80 with intermittent pacing but continued in  Aflutter.  Then he developed increased chest pain, and his blood  pressure dropped to 81 systolic.  He was given 200 mL saline bolus with  improvement of his blood pressure to 120/79.  His discomfort had  essentially resolved at that point.  He was then admitted by Dr. Allyson Sabal  to the step-down unit and we placed him on IV amiodarone.  Will continue  his Coumadin.   PAST MEDICAL HISTORY:  Coronary disease with numerous stent placements.  Last cath had been in October  2008 by Dr. Alanda Amass with patent  bifurcation stent to the circumflex OM, patent LAD stent, and patent  proximal stent, although he did have a new 85% lesion of the proximal  circumflex at that point.  This has been stented by Dr. Tresa Endo with a DES  PROMUS stent. He had not had any recurrent angina and has done quite  well. Then on October 1 he developed atrial fib with rapid ventricular  response.  He was placed on IV Cardizem and then was cardioverted in the  hospital. During that cardioversion he had an associated 7-second pause  and then 2:1 heart block.  He then underwent permanent pacemaker  placement by Dr. Lynnea Ferrier which he tolerated well.  He had done quite  well, was last seen by Dr. Alanda Amass on October 16, but since that time  he has had occasional atrial fib that just has not lasted very long.  Pacemaker is a Engineer, water. Jude  zephyr XL DR and was stable on his last  interrogation in October, and there were no episodes of atrial fib at  that time.   The patient is also diabetic, hypertensive dyslipidemia.  Also the  patient does snore. His wife states he stops breathing at night, but he  has not had a sleep apnea study. He has kind of just ignored the  recommendations to do that up until this point.  He does complain of  with his atrial fib he will get occasionally lightheaded. Also if he  stands up too quickly he gets lightheaded, and he is still queasy since  he had the pacemaker placed.  He is diabetic and glucose has been  controlled.   OUTPATIENT MEDS:  Tenormin 25 mg twice a day, Actos Plus 50/850 b.i.d.  with meals, glimepiride i.e. Amaryl 8 mg with his supper, lisinopril 20  mg daily, simvastatin 80 mg h.s., HCTZ 25 mg daily, Plavix 75 mg daily,  Norvasc 10 mg daily, Coumadin 5 mg some days, other days 7.5. He does  not remember the exact dose currently and it is followed at [facility].   REVIEW OF SYSTEMS:  GENERAL:  No colds, fevers, weight loss  recently.  HEENT: No sinus infections.  No blurred vision. SKIN:  Without rashes.  PULMONARY:  Positive snoring and sleep apnea per his wife.  CARDIOVASCULAR:  As stated.  GI: No diarrhea, constipation or melena.  GU: No hematuria or dysuria.  MUSCULOSKELETAL: No complaints of pain in  his body currently. NEURO:  No syncope, just some lightheadedness if he  stands too quickly.  He does state that at home his blood pressures have  been running lower than usual.   ALLERGIES TO MEDICATIONS:  TERRAMYCIN AND CODEINE.   Please note also on medications, he has also had metformin 750 mg added  to his Actos Plus that he takes at supper time.   FAMILY HISTORY:  Not significant to this admission.   SOCIAL HISTORY:  He is married.  He works as a Air traffic controller  at [employer].  No tobacco use.   PHYSICAL EXAMINATION:  VITAL SIGNS: Today blood pressure was initially  138/62, dropped to 81 systolically and prior to leaving the ER was  120/79. Temperature 98.2.  Pulse between 116 and 134. Respirations 18-  22.  GENERAL:  Alert, oriented. white male in no acute distress, pleasant  affect.  SKIN: Warm and dry.  Brisk capillary refill.  HEENT: Normocephalic.  Sclerae are clear. Glasses in place.  NECK: Supple.  No JVD.  No obvious bruits.  HEART: S1-S2 irregularly irregular.  No obvious murmurs.  LUNGS: Clear without rales, rhonchi or wheezes.  ABDOMEN: Soft, nontender, positive bowel sounds.  Do not palpate liver,  spleen or masses.  LOWER EXTREMITIES: With a little edema up to 2+, right greater than  left, and decreased pedal pulses. NEURO:  Alert, oriented x3.  Follows  commands.   LABORATORY DATA:  Sodium 139, potassium 4.8, BUN 19, creatinine 1.18,  glucose 123.  LFTs were within normal.  Hemoglobin 13.4, hematocrit 40,  platelets 222 and WBC 7.1. INR was 2.5, magnesium 1.9.  CK was 161, MB  2.3, troponin I 0.01.   EKG was without acute changes, and chest x-ray with no  active disease.  His EKG is atrial flutter. initially rapid ventricular response, and  then it was slowing with paced beats.  But no acute ST changes.   IMPRESSION:  1. Atrial  fibrillation/flutter with rapid ventricular response.  2. Chest pain, rule out cardiac.  3. Known coronary disease with multiple stents to all three vessels.  4. Diabetes mellitus type 2, controlled with oral agents.  5. Recent permanent transvenous pacemaker for sick sinus syndrome.   PLAN:  We discontinued his IV Cardizem due to hypotension and we have  placed him on IV amiodarone with a bolus.  He will continue his Coumadin  during the hospitalization. Will do serial CK-MBs to rule out ischemia.  Will  continue his metformin as no plans for cardiac catheterization at this  point and continue blood pressure medications.  Hopefully the amiodarone  will convert him to sinus rhythm and prior to discharge will most likely  change his Norvasc to Cardizem for better control.  Additionally the  patient will need an outpatient sleep study for sleep apnea.      Darcella Gasman. Annie Paras, N.P.      Nanetta Batty, M.D.  Electronically Signed    LRI/MEDQ  D:  10/05/2008  T:  10/06/2008  Job:  [id number]   cc:   Gerlene Burdock A. Alanda Amass, M.D.  Reather Littler, M.D.  Nanetta Batty, M.D.  Lianne Bushy, M.D.

## 2011-04-06 NOTE — Discharge Summary (Signed)
NAME:  Charles [name], Charles [name] NO.:  [id number]   MEDICAL RECORD NO.:  [id number]          PATIENT TYPE:  INP   LOCATION:  2926                         FACILITY:  [facility]   PHYSICIAN:  Nanetta Batty, M.D.   DATE OF BIRTH:  1946-07-23   DATE OF ADMISSION:  10/05/2008  DATE OF DISCHARGE:  10/06/2008                               DISCHARGE SUMMARY   DISCHARGE DIAGNOSES:  1. Paroxysmal atrial fibrillation with rapid ventricular response,      resolved.  2. Chest pain including jaw pain secondary to tachycardia, negative      myocardial infarction.  3. Known coronary artery disease with multiple stents to all three      coronary arteries.  4. Diabetes mellitus type 2 controlled with oral agents and diet.  5. Recent permanent transvenous pacemaker for sick sinus syndrome,      stable.  6. Anticoagulation secondary to paroxysmal atrial      fibrillation/flutter.  7. Sleep apnea.   DISCHARGE CONDITION:  Improved.   PROCEDURES:  None.   DISCHARGE MEDICATIONS:  1. Actoplus Met 15/850 one twice a day with meals.  2. Amaryl i.e. glimepiride 4 mg two tablets to equal 8 mg before      supper.  3. Atenolol 25 mg twice a day.  4. Lisinopril 20 mg one daily.  5. Simvastatin 80 mg daily.  6. Hydrochlorothiazide 25 mg daily.  7. Plavix 75 mg one daily.  8. Metformin 750 mg one daily with the suppertime dose of Actoplus      Met.  9. Coumadin 5 mg one most days, 7.5 mg other days.  He did not bring      his regimen with him but he will continue that as he has been      therapeutic in the hospital on Coumadin.  10.Aspirin 81 mg daily.  11.Amlodipine 10 mg daily.  We have stopped.  12.Begin Cardizem CD 120 mg one daily instead of the amlodipine.   DISCHARGE INSTRUCTIONS:  1. May return to work October 07, 2008.  2. Low-sodium heart-healthy diabetic diet.  3. Activity as tolerated.  4. Follow up with Dr. Alanda Amass in 1-2 weeks in the office will call      with date and time.  5. We will call you to schedule your sleep study appointment.   HISTORY OF PRESENT ILLNESS:  A 65 year old white married male with  coronary artery disease including stents to the RCA, circumflex and LAD  most recently in September 25, 2007, with a drug-eluting stent and now  with a permanent transvenous pacemaker secondary to sick sinus syndrome  presented to [facility] on October 05, 2008 with rapid heart rate  that woke him from sleep.  He felt some jaw aching, fatigue, shortness  of breath.  Rate seemed to slow because he felt better but he went on to  work but he felt worse at work so we asked him to come to the emergency  room.  He does not feel well when he is in AFib flutter.  Heart rate was  120-130 on arrival and he was given  20 mg of IV Cardizem.  Blood  pressure was 135 systolic at that time.  His heart rate slowed to 80.  With intermittent pacing continued a flutter.  He then developed  increased chest pain, blood pressure dropped to 81 and was given 200 mL  of saline and blood pressure was back to 120/79, felt secondary to the  IV Cardizem.  He was to be placed on IV amiodarone but prior to that  been hung he converted to a sinus rhythm in the emergency room.  He was  never given the amiodarone.   He did well overnight maintaining sinus rhythm.  His Norvasc had been  discontinued and he was placed on Cardizem CD 120 daily  prior to his  discharge home.  The patient's cardiac enzymes were negative and was  felt his discomfort was related to his tachycardia.   By the morning of October 06, 2008, he was stable and no complaints.  Vital signs were stable.  He was felt ready for discharge home after  ambulating in the hall without trouble.  He will follow up with Dr.  Alanda Amass and he will decide at that point if he would like to do an  outpatient stress test.   We will have him see Dr. Alanda Amass in 1-2 weeks.   LABORATORY DATA:  Hemoglobin 13.4, hematocrit 40.7, WBC 7.1,  platelets  222, MCV 90.  These remained stable.  Prior to discharge, hemoglobin  12.2, hematocrit 36.7.  Chemistry, sodium 139, potassium 4.8, chloride  106, CO2 27, BUN 19, creatinine 1.18, glucose 123.  Coags on admission,  protime 28.5, INR 2.3, PTT 37.  LFTs were entirely normal.  Cardiac  enzymes were negative.  CK ranged 94-161.  MBs 1.6-2.3.  Troponin I less  than 0.01, calcium 8.9, magnesium 1.9.   BNP was 240.  Glycohemoglobin was 6.1 and TSH was 1.349.  Urinalysis was  clear.   Chest x-ray.  Lungs clear.  No pneumothorax.  No acute abnormality.   The patient has history of normal LV function by cardiac cath on October  2008.   EKG.  Initial EKG with a flutter with variable block once he slowed  down.  EKG sinus rhythm with pacing and no acute ST changes.   HOSPITAL COURSE:  As stated.   PHYSICAL EXAM AT DISCHARGE:  VITAL SIGNS:  Blood pressure 117/65, pulse  85, respirations stable and he was afebrile, heart rate was 60,  temperature 97.7, and respirations were 14.  HEART:  Regular rate and rhythm.  LUNGS:  Clear to auscultation.  EXTREMITIES:  Without edema.   He was seen in discharge by Dr. Garen Lah.  He will follow up with Dr.  Alanda Amass as previously instructed.      Darcella Gasman. Annie Paras, N.P.      Nanetta Batty, M.D.  Electronically Signed    LRI/MEDQ  D:  10/06/2008  T:  10/07/2008  Job:  [id number]   cc:   Gerlene Burdock A. Alanda Amass, M.D.  Reather Littler, M.D.  Lianne Bushy, M.D.

## 2011-04-06 NOTE — H&P (Signed)
NAME:  Charles [name], Charles [name] NO.:  [id number]   MEDICAL RECORD NO.:  [id number]          PATIENT TYPE:  EMS   LOCATION:  MAJO                         FACILITY:  [facility]   PHYSICIAN:  Lindaann Slough, M.D.  DATE OF BIRTH:  20-Dec-1945   DATE OF ADMISSION:  06/15/2009  DATE OF DISCHARGE:                              HISTORY & PHYSICAL   CHIEF COMPLAINT:  Left flank pain and left ureteral calculi and left  hydronephrosis.   HISTORY OF PRESENT ILLNESS:  The patient is a 65 years old male, who has  a long history of kidney stones.  He states for the past 30 years, he  has been having stones and he usually passes them.  Since Wednesday, he  has been having severe left flank pain associated with nausea.  He  thought that he would be able to pass the stone as he has always done in  the past.  However, the pain persisted and became worse this morning.  He then came to the emergency room.  CT scan showed bilateral renal  calculi and a 6 x 11 mm stone at the proximal left ureter with mild-to-  moderate left hydronephrosis.  He has a history of coronary artery  disease and atrial fibrillation.  He is on Coumadin and Plavix.  His INR  is 5.9.  He was treated with IV morphine, the pain has subsided somewhat  but he has started to have pain again.  He is now admitted for pain  control and stone management when the INR is within normal limits.   PAST MEDICAL HISTORY:  Positive for diabetes, coronary artery disease,  atrial fibrillation, myocardial infarction.  He also has a pacemaker and  he has sleep apnea.   PAST SURGICAL HISTORY:  He had exploratory laparotomy at age 17 with  appendectomy and small bowel resection.  He has a pacemaker.  He had  bilateral knee surgeries, and he has 7 coronary artery stents.   FAMILY HISTORY:  His father died at age 25 of unknown causes to him.  His mother is 53 years old and has congestive heart failure.  He has one  sister and one brother.   SOCIAL HISTORY:  He is married, has one daughter.  Does not smoke and  drinks occasionally.   MEDICATIONS:  1. He is on Coumadin 5 to 7.5 mg daily.  2. Diltiazem 120 mg a day.  3. Zocor 80 mg a day.  4. Plavix 75 mg a day.  5. HCTZ 25 mg.  6. Glimepiride 4 mg.  7. Metformin 850 mg twice a day.  8. Tramadol/acetaminophen 325 mg twice a day.  9. Lisinopril 5 mg a day.  10.Betapace 120 mg twice a day.   ALLERGIES:  No known drug allergies.   REVIEW OF SYSTEMS:  Is as noted in the HPI and everything else is  negative.   PHYSICAL EXAMINATION:  GENERAL:  This is a well-developed 65 years old  male who is complaining of left-sided abdominal pain.  He also has  nausea.  He is alert and oriented to time, place, and person.  VITAL SIGNS:  Blood pressure is 129/90, pulse 60, respirations 20,  temperature 98.3.  SKIN:  His skin is warm and dry.  HEENT:  His head is normal.  He has a pink conjunctivae.  Ears, nose,  and throat are within normal limits.  NECK:  Supple.  No cervical adenopathy.  No thyromegaly.  CHEST:  Symmetrical.  Lungs are fully expanded and clear to percussion  and auscultation.  HEART:  Regular rhythm.  ABDOMEN:  Protuberant, soft, tender in the left flank, he has mild left  CVA tenderness at this time.  Kidneys are not palpable.  He has no  hepatomegaly, no splenomegaly.  Bladder is not distended.  Bowel sounds  are normal.  GENITALIA:  Penis is circumcised.  Meatus is normal.  Scrotum is normal.  He has no testicular mass.  Cords and epididymis are within normal  limits.   On rectal examination, he has no external hemorrhoids.  Sphincter tone  is normal.  Prostate is enlarged to 40 g without any nodules and seminal  vesicles are not palpable.   LABORATORY DATA:  Hemoglobin is 11.9, hematocrit 34.1, WBC 7.8, BUN 18,  creatinine 1.85, calcium 8.8.  Sodium 136, potassium 4.4.  Urinalysis  shows no RBCs or WBCs and nitrite negative.   I independently reviewed the  CT scan and he has bilateral renal calculi  and has a 6 x 11 mm stone in the proximal left ureter with mild-to-  moderate hydronephrosis.   IMPRESSION:  Bilateral renal calculi, left ureteral calculus with  hydronephrosis, diabetes, coronary artery disease, history of atrial  fibrillation.   PLAN:  Since the patient is still symptomatic, I will admit him for pain  control.  I have asked cardiology to see him in consultation regarding  the Coumadin and Plavix management.  We will consider double J stent  placement if he continues to have pain, and then have management of the  stone at a later date when the INR is within normal limit.      Lindaann Slough, M.D.  Electronically Signed     MN/MEDQ  D:  06/15/2009  T:  06/16/2009  Job:  [id number]

## 2011-04-06 NOTE — Discharge Summary (Signed)
NAME:  Charles [name], Charles [name] NO.:  [id number]   MEDICAL RECORD NO.:  [id number]          PATIENT TYPE:  INP   LOCATION:  2025                         FACILITY:  [facility]   PHYSICIAN:  Ritta Slot, MD     DATE OF BIRTH:  1946-07-01   DATE OF ADMISSION:  08/24/2008  DATE OF DISCHARGE:  08/29/2008                               DISCHARGE SUMMARY   DISCHARGE DIAGNOSES:  1. Paroxysmal atrial fibrillation.  The patient had 36 hours of atrial      fibrillation.  He converted with greater than 7.0 second pause.  He      almost passed out.  He then had a escaped junctional rhythm.  He      was on IV Cardizem at the time and this was discontinued.  Later      that day, August 25, 2008, he did have episodes of 2:1 heart block.  2. Status post permanent pacemaker implanted by Dr. Ritta Slot on      August 28, 2008.  He had a [employer] Zephyr XL DR [id number]      implanted.  Please see Dr. Sandria Senter Solomon's complete pacemaker      report.  3. Coronary artery disease with a history of multiple percutaneous      coronary interventions including a percutaneous coronary      intervention to his circumflex/obtuse marginal, stents to his left      anterior descending in 1998, stents to his right coronary artery in      2002, and a percutaneous transluminal coronary angioplasty to his      diagonal in 2002.  He has also had a percutaneous transluminal      coronary angioplasty and a PROMUS drug-eluting stent to his      proximal circumflex in November 2008.  4. Preserved left ventricular function by catheterization, October      2008.  5. Hypertension.  6. Obesity.  7. Dyslipidemia.  8. Type 2 diabetes mellitus.  9. Benign prostatic hypertrophy.   LABORATORY DATA:  Sodium 133, potassium 3.8, chloride 101, CO2 24,  glucose 106, BUN 14, and creatinine 0.98.  Hemoglobin 13.5, hematocrit  39.5, WBC 7.1, and platelets 186.  CK-MB and troponin negative x3.  TSH  1.837.  BNP was 128 on  admission.  Magnesium 1.9.  Chest x-ray, August 29, 2008, showed dual-lead pacemaker now in place with a battery pack on  the left, decrease in his atelectasis in the right lung base.  Chest x-  ray, August 24, 2008, showed perihilar atelectasis and the scarring  appeared stable.  No acute cardiopulmonary abnormalities.   DISCHARGE MEDICATIONS:  1. Actos/metformin 15/850 two times a day.  2. Glimepiride 8 mg before supper.  3. Atenolol was increased to 25 mg twice per day.  4. Lisinopril 20 mg a day.  5. Simvastatin 80 mg at bedtime.  6. Hydrochlorothiazide 25 mg a day.  7. Plavix 75 mg a day.  8. Norvasc 10 mg a day.  9. Protonix 40 mg a day.  10.K-Dur daily.  11.Warfarin 5 mg a day at  6 p.m.   OTHER FOLLOWUP INSTRUCTIONS:  Include written pacemaker discharge  instructions, warfarin discharge instructions.  Also an appointment was  made for him to see Dr. Alanda Amass back on September 06, 2008 and he will  go for a Coumadin/warfarin INR check and dosing regime on September 02, 2008 at our office.   HOSPITAL COURSE:  Charles [name] is a 65 year old white married male patient  of Dr. Susa Griffins and Dr. Lucianne Muss who came into the ER with  irregular heartbeat, jaw pain, shortness of breath, and chest pressure.  He was apparently feeling well.  He was sitting in a desk at work and an  irregular heart rate began.  This started about 3:30 p.m.  He came to  the emergency room and it was decided to admit him.  He was placed on IV  Cardizem and IV nitroglycerin.  Because he had missed some of his  Plavix, he was given 300 mg and 75 mg a day.  After that, he was started  on IV heparin for his atrial fib.  He was seen by Dr. Jacinto Halim.  It was  decided that he would need cardioversion the following day if he had not  converted.  On August 25, 2008, he did convert to sinus rhythm but he  had a 7.4 second pause followed by escaped junctional rhythm.  He almost  passed out.  He then later that day,  5-6 hours later, he had 2:1 AV  block.  His diltiazem had been discontinued when he had his conversion.  The following day, he was seen by Dr. Ritta Slot who discussed with  the possibility of pacemaker insertion.  The patient was unsure if he  wanted it.  We had also held his atenolol.  He then that day had a burst  of an SVT.  The following day, Dr. Lynnea Ferrier again spoke with Charles [name]  about a pacemaker and the patient agreed.  On August 28, 2008, the  pacemaker was inserted by Dr. Lynnea Ferrier and on August 29, 2008, he was  stable.  His pacemaker site was without any major bruise or hematoma.  He was considered stable for discharge home.  Dr. Lynnea Ferrier spoke with Dr.  Alanda Amass about if the patient needed Coumadin or not and it was decided  that he would go home on Plavix and Coumadin and no aspirin.  His blood  pressure was 103/69, pulse was 60, heart rate was 18, and temperature  was 97.  His O2 sats were 97%.  He was pacing and in sinus rhythm on the  day of discharge.      Charles [name], N.P.      Ritta Slot, MD  Electronically Signed    BB/MEDQ  D:  08/29/2008  T:  08/30/2008  Job:  [id number]   cc:   Reather Littler, M.D.

## 2011-04-06 NOTE — Discharge Summary (Signed)
NAME:  Charles [name], Charles [name] NO.:  [id number]   MEDICAL RECORD NO.:  [id number]          PATIENT TYPE:  INP   LOCATION:  3733                         FACILITY:  [facility]   PHYSICIAN:  Sheliah Mends, MD      DATE OF BIRTH:  [birth year]   DATE OF ADMISSION:  12/14/2008  DATE OF DISCHARGE:  12/14/2008                               DISCHARGE SUMMARY   DISCHARGE DIAGNOSES:  1. Paroxysmal atrial fibrillation, symptomatic with reported rapid      ventricular response, but no strips to show this.  2. Chest pain with rapid atrial fibrillation, cardiac enzymes      negative.  3. Known coronary artery disease with previous stents, last cath on      September 22, 2007, with patent remote LAD stent and patent      bifurcation at left circumflex OM and patent proximal RCA stent,      80% proximal circ lesions, stented on September 25, 2007, with a DES      Promus.  Last Cardiolite in June 2009, no significant change from      previous.  4. Permanent pacemaker for sick sinus syndrome.  5. Hypertension.  6. Dyslipidemia.  7. Diabetes mellitus.  8. Obesity.  9. Chronic anticoagulation with Coumadin for paroxysmal atrial      fibrillation.   DISCHARGE MEDICATIONS:  Same as admission.  1. Atenolol 75 mg a day.  2. Lisinopril 10 mg a day.  3. Simvastatin 80 mg at bedtime.  4. Actoplus 15/850 twice per day.  5. HCTZ 25 mg a day.  6. Plavix 75 mg a day.  7. Glyburide 8 mg at bedtime.  8. Aspirin 81 mg a day.  9. Coumadin 7.5 mg daily except 5 mg on Mondays.  10.KCl 20 mEq a day.  11.Cardizem CD 120 mg at bedtime.  12.Metformin ER at dinner 750 mg a day.   LABORATORY DATA:  Sodium 139, potassium 4.0, chloride 105, CO2 of 23,  glucose 122, BUN 20, and creatinine 0.99.  INR 2.2.  Hemoglobin 12.3,  hematocrit 37.1, WBCs 6.21, and platelets 172.  Magnesium 1.9.  CK-MBs  and troponins are negative x2 and point-of-care marker negative x1.  Total cholesterol 117, triglycerides 97, HDL of 39,  and LDL 59.  Chest x-  ray shows no acute cardiopulmonary abnormalities.   HOSPITAL COURSE:  [name] is a 65 year old African American male  patient of Dr. Susa Griffins with prior medical history of PAF and  coronary artery disease and sick sinus syndrome with pacemaker  implantation.  He apparently had an episode of violent palpitations at  11:30 p.m. associated with chest pain, radiating to his jaw and left arm  with some shortness of breath and diaphoresis.  He took an extra  Cardizem 120 mg 2 sublingual nitroglycerin and aspirin.  Apparently, the  palpitation continued and he activated EMS.  EMS had stated that his  initial pulse was 150, but they did not have a monitor stroke for that.  After they hooked him to a monitor, his heart rate was abnormal sinus  rhythm.  He felt  a little woozy with a blood pressure of 80 systolic in  the beginning, probably secondary to his multiple medications he had  taken extra.  He was seen by Dr. Garen Lah on the morning of December 14, 2008.  His blood pressure was up to go back up to normal.  His CK-MBs  and troponins were negative and he was in the sinus rhythm, pacing and  he was considered stable for discharge home.  He will follow up with Dr.  Susa Griffins in the next 2 weeks.      Lezlie Octave, N.P.      Sheliah Mends, MD  Electronically Signed    BB/MEDQ  D:  12/14/2008  T:  12/15/2008  Job:  [id number]   cc:   Gerlene Burdock A. Alanda Amass, M.D.

## 2011-04-06 NOTE — Op Note (Signed)
NAME:  [name], Charles [name] NO.:  [id number]   MEDICAL RECORD NO.:  [id number]          PATIENT TYPE:  OBV   LOCATION:  1431                         FACILITY:  [facility]   PHYSICIAN:  Excell Seltzer. Annabell Howells, M.D.    DATE OF BIRTH:  03-Dec-1945   DATE OF PROCEDURE:  06/26/2009  DATE OF DISCHARGE:  06/27/2009                               OPERATIVE REPORT   RE-DICTATION:   PROCEDURE:  Cystoscopy, left retrograde pyelogram, with interpretation,  left ureteroscopic stone extraction with holmium laser tripsy, insertion  of left double-J stent.   PREOPERATIVE DIAGNOSIS:  Mr. Szatkowski is a 65 year old white male with a  history of stones.  He has symptomatic left ureteral stones and  underwent stenting with a previous cystoscopy.  He returns now for  definitive ureteroscopy.   FINDINGS AND PROCEDURE:  The patient was given Cipro.  He was taken to  the operating room, where general anesthetic was induced.  He was placed  in the lithotomy position.  His perineum and genitalia were prepped with  Betadine solution.  He was draped in the usual sterile fashion.   Cystoscopy was performed using the 22-French scope  and a 12-degree  lens.  The bladder was described at previous cystoscopy.  The left stent  was grasped with a grasping forceps and removed through the urethral  meatus.  A guidewire was passed through the stent and up to the kidney.  A 6-French short ureteroscope was then passed alongside the wire, and in  the midureter he was found to have about a 5 mm stone.  This was removed  with a basket.  I was unable to advance the rigid scope to the kidney,  where the primary stone was noted on fluoroscopy, so the ureteroscope  was removed, and an access sheath was passed to the kidney.  The wire  and core were removed, and the digital ureteroscope was then passed to  the kidney.  The stone was visualized and engaged with the 200 micron  laser fiber and broken into manageable fragments.   Several of the fragments were then removed with a nitinol basket.  Final  inspection revealed only a small amount of fine fragments and no large  fragments.  He had actually had additional stones noted in the calyces  in the mid and upper pole which were fragmented and removed as well, but  had some residual upper and lower pole stones which were not accessed.  Once the stones had been sufficiently fragmented and fragments removed,  the guidewire was replaced.  The cystoscope was replaced over the wire,  and a 6-French 26 cm double-J stent was placed without difficulty.  The  bladder was then drained.  The patient was taken down from the lithotomy  position.  His anesthetic was reversed, and he was moved to the recovery  room in stable condition.  He will be kept overnight because of his  history of sleep apnea and discharged in the morning.      Excell Seltzer. Annabell Howells, M.D.  Electronically Signed     JJW/MEDQ  D:  07/03/2009  T:  07/03/2009  Job:  [id number]

## 2011-04-06 NOTE — Discharge Summary (Signed)
NAME:  Charles [name], Charles [name] NO.:  [id number]   MEDICAL RECORD NO.:  [id number]          PATIENT TYPE:  INP   LOCATION:  1421                         FACILITY:  [facility]   PHYSICIAN:  Excell Seltzer. Annabell Howells, M.D.    DATE OF BIRTH:  12-30-1945   DATE OF ADMISSION:  06/15/2009  DATE OF DISCHARGE:  06/17/2009                               DISCHARGE SUMMARY   Briefly, Mr. Gosling is a 65 year old male with a history of stones who  had presented with severe left flank pain since Wednesday of last week.  CT scan revealed bilateral renal calculi with a 6 x 11-mm left proximal  ureteral stone with mild to moderate hydro.  The patient is on Coumadin  and Plavix for atrial fibrillation, coronary artery stents and had an  INR on admission of 5.9.  He was admitted for pain control.   His past history is significant for coronary artery disease with  multiple percutaneous interventions and history of coronary drug-eluting  stent in November 2008.  He has a history of paroxysmal atrial  fibrillation and sick sinus syndrome with pacemaker placed earlier this  year, hypertension, obstructive sleep apnea, type 2 diabetes, morbid  obesity and hypertension.   ADMISSION MEDICATIONS:  Include:  1. Glyburide 8 mg at bedtime.  2. Lisinopril 5 mg daily.  3. Simvastatin 80 mg daily.  4. Hydrochlorothiazide 25 mg daily.  5. Plavix 75 mg daily.  6. Coumadin.  7. Potassium chloride.  8. Metformin 750 mg daily.  9. Cardizem CD 120 mg daily.  10.Betapace 120 mg daily.  11.Fish oil.  12.Vitamin C.  13.He also take tramadol and oxycodone as needed for pain.   For additional details of the history and physical, please see the  dictated history and physical.   ACCESSORY CLINICAL INFORMATION:  His admission labs demonstrated white  count 7.8, hemoglobin 11.9.  PT of 59 with an INR 5.9.  Subsequent PT  was 132.  Chemistries within normal limits with exception of glucose of  146 and creatinine of 1.8 with  calculated GFR of 37.  Albumin was 3.1.  His urinalysis was negative.  The CT report as noted above.   HOSPITAL COURSE:  On the day of admission, the patient was taken off his  Coumadin, and his Plavix was held, and he was started on PCA morphine.  He continued to have symptoms.  A cardiology consult was obtained.  He  was given fresh frozen plasma to help reverse his anticoagulation, and  on the evening of July 26, he was taken to the operating room for left  ureteral stent insertion.  His INR prior to surgery was 2.3.  He  underwent a cystoscopy, left retrograde pyelogram, left ureteral stent  insertion without complications under general anesthetic, and  postoperatively, he was admitted to the delivery unit.  On the day  following surgery, he was doing well without pain.  Urine was slightly  bloody.  His INR was down to 1.7 with a PT 21.  PTT remained elevated at  52.  His chemistries were remarkable only for a glucose of 135.  His  creatinine returned to the normal range at 1.41 with an estimated GFR of  51.  At this point, he was felt to be ready for discharge home.  He was  discharged home with instructions to resume his preoperative medications  with the exception of the Plavix and Coumadin which will be held pending  his definitive ureteroscopy.  He will be placed on an 81-mg aspirin in  the meantime.   His disposition is to home.  His condition is improved.  His prognosis  is good.   FINAL DIAGNOSES:  1. Left ureteral stone with obstruction and mild acute renal failure.  2. Coagulopathy.   COMPLICATIONS:  None.      Excell Seltzer. Annabell Howells, M.D.  Electronically Signed     JJW/MEDQ  D:  06/17/2009  T:  06/17/2009  Job:  [id number]   cc:   Gerlene Burdock A. Alanda Amass, M.D.  Fax: [phone]   Sheliah Mends, MD  Fax: [phone]

## 2011-04-06 NOTE — Cardiovascular Report (Signed)
NAME:  Charles, [name] NO.:  [id number]   MEDICAL RECORD NO.:  [id number]          PATIENT TYPE:  INP   LOCATION:  6533                         FACILITY:  [facility]   PHYSICIAN:  Nicki Guadalajara, M.D.     DATE OF BIRTH:  August 09, 1946   DATE OF PROCEDURE:  09/25/2007  DATE OF DISCHARGE:                            CARDIAC CATHETERIZATION   PROCEDURE:  Percutaneous coronary artery intervention.   INDICATIONS:  Charles [name] is a 65 year old patient of Dr.  Alanda Amass who is status post numerous prior percutaneous coronary  interventions in the past.  He is status post remote posterior MI  treated with emergent PTCA in June 1995 with redilatation secondary to  restenosis August 1995.  He developed progressive disease and underwent  high-speed rotational atherectomy, mid circumflex OM bifurcation  stenting, as well as LAD stenting utilizing bare metal stents in 1998.  He also had several stents placed in his right coronary artery in 2002  with PTCA of his diagonal vessel.  On September 22, 2007 he had undergone  diagnostic cardiac catheterization by Dr. Alanda Amass at [facility] which revealed, in addition to mild-to-moderate concomitant  disease as noted in his catheterization report, also 85% focal narrowing  in the proximal circumflex on a tortuous bend in the vessel prior to the  proximally previously placed stent which was a 30 (I believe) x 12 mm  stent.  The patient was hydrated over the weekend at home.  He also was  started on Plavix 300 mg on Saturday and Sunday, as well 75 mg today,  and scheduled for elective percutaneous coronary intervention of his  left circumflex coronary artery.   DESCRIPTION OF PROCEDURE:  After premedication with Valium 5 mg  intravenously, the patient was prepped and draped in the usual fashion.  His right femoral artery was punctured anteriorly, and a 6-French sheath  was inserted.  A Voda 4-0 guide was used due to the  90-degrees angle  takeoff of the circumflex vessel.  Bivalirudin was administered in bolus  and infusion, and after documentation of therapeutic anticoagulation  Asahi medium wire was inserted in an attempt to provide additional  support due to the vessel tortuosity.  This wire was advanced down the  distal circumflex.  IC nitroglycerin was also administered.  Initially,  3-0 x 12-mm Maverick balloon was used for predilatation.  Due to the  sharp 90-degree angle of the circumflex as well as the tortuosity where  the narrowing was prior to the previously placed stent.  A drug-eluting  Promus stent was used, 3.5 x 15 mm.   There was some difficulty in traversing the tortuous segment, but  ultimately this stent was able to be advanced distal enough to be in  tandem to the previously placed proximal stent, and completely covering  the proximal 75% stenosis.  Sequential dilatations were made up to 12  atmospheres with this stented balloon.  A 3.75 x 12 mm Quantum balloon  was then advanced for poststent dilatation.  The balloon was also  advanced into the midportion of the previously placed older stent, nd  and sequential inflations were made at higher atmospheres to allow for a  vessel taper, with ultimate dilatation up to 3.71 mm in the newly placed  stent.  Scout angiography confirmed an excellent angiographic result  with brisk TIMI 3 flow without evidence for dissection.  The patient  also was started on IV nitroglycerin in addition to receiving IC  nitroglycerin during the procedure.  He also received an additional 25  mg of fentanyl for sedation.  He tolerated the procedure well.  A 45-  degree RAO view was done at the end the procedure to see if the patient  was a candidate for a StarClose closure device, but based on the sheath  position, it was not felt that he was a candidate for closure.  He left  the catheterization laboratory in stable condition.   HEMODYNAMIC DATA:  Central  aortic pressure was 111/65.   ANGIOGRAPHIC DATA:  The left main coronary artery was a moderate sized  upper takeoff vessel that bifurcated into an LAD and gave rise to a  circumflex which had 90-degree takeoff off this left main.  There was  75%-80% narrowing in a tortuous segment, on a curve, just proximal to  the previously placed proximal circumflex stent with evidence for the  bifurcation stent to the OM vessel more distally.  Following successful  PTCA, ultimate stenting with a 3.5 x 15 mm Promus drug eluting stents  with poststent dilatation up to 3.71 mm, the 80% stenosis was reduced to  0%.  There is brisk TIMI 3 flow.  There was no evidence for dissection.   IMPRESSION:  Successful percutaneous coronary artery intervention to the  proximal left circumflex coronary artery with a newly placed 3.50 x 15  mm drug-eluting Promus stent placed in proximal tandem fashion to the  previously placed 30 x 12 mm stent with poststent dilatation up to 3.71  mm done with bivalirudin/Plavix/nitroglycerin.           ______________________________  Nicki Guadalajara, M.D.     TK/MEDQ  D:  09/25/2007  T:  09/26/2007  Job:  [id number]   cc:   Gerlene Burdock A. Alanda Amass, M.D.  Stanley C. Andrey Campanile, M.D.  Reather Littler, M.D.  Dr. Annabell Howells

## 2011-04-09 NOTE — Cardiovascular Report (Signed)
. [facility]  Patient:    Charles [name], Charles [name] Visit Number: [id number] MRN: [id number]          Service Type: MED Location: [phone] Attending Physician:  Ruta Hinds Proc. Date: 07/25/01 Admit Date:  07/25/2001   CC:         Cath Lab  [place] C. Andrey Campanile, M.D.   Cardiac Catheterization  PROCEDURE:  Retrograde central aortic catheterization, angiography by Judkins technique.  Pre and post IV nitroglycerin administration.  LV angiogram RAO and LAO projection, subselective LIMA, abdominal aortic angiogram midstream PA projection, Aggrastat bolus plus infusion, weight-adjusted heparin, p.o. Plavix 300 mg, PTCA and stent high grade proximal RCA stenosis.  BRIEF HISTORY:  Mr. Thong is a longterm patient of mine.  He is married, nonsmoker, father of one daughter who lives in [place], [place], now (daughter from his first marriage).  He has known history of exogenous obesity, AODM, hyperlipidemia, and coronary artery disease.  He underwent acute emergency PTCA in the setting of a PMI on May 05, 1994, with 100% circumflex occlusion reperfusion time of 3 minutes 42 seconds with balloon dilatation.  He required recatheterization on July 02, 1994, for eccentric 70 to 80% circumflex restenosis and had elective angioplasty with a 3.5 balloon July 02, 1994.  He had recurrent angina and on December 05, 1996, had his third intervention.  He had a complex procedure with rotoblation of restenotic lesion of the circumflex and tophat type stenting of the circumflex proper with a 3.0 x 12 stent and the obtuse marginal with a 3.0 x 9 stent. A new 85% LAD lesion was stented with a 3.5 x 18 AV stent.  He did well longterm without angina on medical therapy until recent development of exertion and anxiety-related ischemic jaw and neck discomfort over a three-day period of time.  He was admitted, placed on IV nitroglycerin and heparin and  brought electively to the catheterization laboratory.  DESCRIPTION OF PROCEDURE:  The right groin was prepped and draped in the usual fashion.  1% Xylocaine was used for local anesthesia.  The right femoral artery was entered with a single anterior puncture using an 18 thin-wall needle and a 6 French short Daig sidearm sheath was inserted without difficulty.  The diagnostic angiography was done with 6 French 4 cm taper, preformed Cordis catheters and pigtail catheter.  LV angiogram was done in the RAO and LAO projection at 25 cc, 14 cc per second, and 20 cc, 12 cc per second, respectively.  Pullback pressure of CA showed no gradient across the aortic valve.  Abdominal aortic angiogram was done in the midstream PA projection at 25 cc, 20 cc per second demonstrating single normal renal arteries bilaterally, normal infrarenal aorta, and proximal iliacs with good runoff.  Patent sciliac SMA axis and IMA.  No aneurysm or stenosis.  Subselective LIMA showed a widely patent LIMA with no subclavian stenosis, antegrade vertebral flow was present.  LV angiogram demonstrated vigorously and normally contracting LV with EF 60%, moderate LVH.  No mitral regurgitation present.  PRESSURES:  LV 150/0, LVEDP 16 mmHg.  CA 150/75 mmHg.  No gradient across the aortic valve on catheter pullback.  Main left coronary artery was normal.  Left anterior descending artery showed +2 calcification in the proximal third. There was less than 40% smooth narrowing of the previously placed stent (December 05, 1996) after the [facility] branch.  There was another 40% smooth narrowing in the midportion mildly segmental and another mildly  segmental 40% narrowing at the junction of the distal third with good filling of the LAD to the apex.  The first diagonal had no significant stenosis and bifurcated.  It arose before the first septal perforator.  The second diagonal had an eccentric 85% stenosis in its proximal portion  and another 40% narrowing in midportion before the bifurcation.  The lesion was after the ostia with good flow.  The circumflex artery gave off a small first marginal that was normal, a moderate size thin long second marginal which was normal, followed by a large atrial branch.  At this area there was 30 to 40% narrowing mildly segmental.  In the area of prior stenting in the mid circumflex across the OM3 branch, there was less than 30% narrowing.  The stent in the OM3 branch had approximately 40% narrowing with good flow.  There was Timi 3 flow to the distal circumflex.  The proximal RCA showed an eccentric 85% stenosis about 5 mm after the origin. There was another 20% narrowing in the midportion.  It was a very large RCA with a large normal RV branch in the midportion and a large PLA and PDA originating before the acute margin which were widely patent and normal.  The patients history is as outlined above.  He has developed a new high grade greater than 85% stenosis of his proximal right which may be his culprit lesion. He also has a high grade lesion of the proximal DX2, but the right is certainly a very large dominant vessel.  It was elected to proceed with PCI of his right coronary artery and probable elective staging of his diagonal lesion.  Informed consent was obtained from the patient to proceed.  He was given weight-adjusted heparin of 6000 units, Aggrastat bolus plus infusion, 300 mg of Plavix and had been on aspirin.  The patient was given intermittent Nubane and Versed for sedation during the procedure.  The right coronary artery was intubated with a 6 [place] JR4 guiding catheter and the lesion was crossed with an HTF 0.014 inch guide wire.  The proximal RCA lesion was primarily stented with a 4.0 x 18 BX Velocity Hepacoat stent which was deployed at 14-46 and redilated at 16-45.   The segmental high grade stenosis and associated  narrowing in the proximal RCA was  covered, however, there was a very small area of the proximal portion of the lesion that was not covered with the stent, so a second overlapping 4.0 x 8 BX Velocity stent was positioned in tandem to this beyond the ostia and overlapping the first stent.  It was deployed at 16-20 and postdilated at 19-40.  At these pressures, approximately stent diameter is 4.5 mm.  Dilatation system was removed.  IC nitroglycerin 200 mcg was given and final injection showed stenosis reduction of 85% to less than 10 in the proximal RCA with good Timi 3 flow throughout the vessel and no dissection.  The patient had chest discomfort with inflations promptly relieved with balloon deflations.  He tolerated the procedure well.  We plan to do a staged intervention on his DX2 because of current dye-load and late hour.  CATHETERIZATION DIAGNOSES: 1. Atherosclerotic heart disease - status post prior interventions -    percutaneous coronary intervention.    a. Acute myocardial infarction treated with emergency circumflex       percutaneous transluminal coronary angioplasty on May 05, 1994,       reperfusion time equaled 3 hours 42 minutes.  b. Elective redilatation of circumflex July 02, 1994.    c. Recurrent angina with HSRA plus stenting mid circumflex and side branch       OM3, plus stenting of new left anterior descending lesion December 05, 1996. 2. Recurrent angina, new onset.    a. Progression of disease, new lesion proximal right coronary artery and       diagonal 2.    b. Successful percutaneous transluminal coronary angioplasty and stent to       proximal right coronary artery as outlined above. 3. Hyperlipidemia. 4. Normal left ventricular function. 5. Adult onset diabetes mellitus. 6. Nonsmoker. 7. Exogenous obesity. Attending Physician:  Ruta Hinds DD:  07/25/01 TD:  07/26/01 Job: [id number]

## 2011-04-09 NOTE — Op Note (Signed)
[place]. [facility]  Patient:    KILLIAN, SCHWER Visit Number: [id number] MRN: [id number]          Service Type: MED Location: [phone] Attending Physician:  Ruta Hinds Proc. Date: 07/26/01 Admit Date:  07/25/2001   CC:         Duffy Rhody C. Andrey Campanile, M.D.  Lenise Herald, M.D.   Operative Report  PROCEDURE:  Retrograde central aortic catheterization, selective left coronary angiography by Judkins technique, pre and post IC nitroglycerin administration, weight adjusted heparin 5000 units IV, continue aggrastat infusion, PTCA and stent, high grade proximal DX-2 stenosis.  DESCRIPTION OF PROCEDURE:  The patient was brought to the second floor CP Lab in the post-absorptive state after premedication with 5 mg of Valium p.o. . He was given Nubain 2 mg, and Versed 2 mg for sedation in the laboratory. Initially we chose to use the left groin and 1% Xylocaine was used for local anesthesia.  There was difficulty finding the left femoral artery in view of the of the patients obesity.  Puncture was performed but had to be abandoned and pressure was placed over the left groin.  We went to the right groin and RFA was entered with an anterior puncture using an 18 thin wall needle and a 6 French short daig side arm sheath was inserted without difficulty. Catheterization was performed with the JL-4, 6 [place] ACS guiding catheter. ACTs were monitored and the patient was given 5000 units of intravenous heparin.  Aggrastat infusion was continued along with supportive fluids. Arterial pressures were monitored throughout the procedure.  The patient remained in sinus rhythm and the blood pressure ranged from 140 to 160 mmHg.  Repeat coronary angiography demonstrated a high grade eccentric 90% proximal DX-2 stenosis in a moderate sized vessel that trifurcated distally.  The lesion was crossed with a 0.014 inch HTF guidewire and with subsequently with a  2.5/10 ACS cross-sail balloon.  The lesion was dilated at 9-35 with a dumbbell and then redilated at 9-38 with full expansion.  The balloon was removed and under fluoroscopic control replaced with a 2.5-13 ACS small vessel pixel stent which was precisely positioned over the lesion beyond the DX-2 ostia and deployed at 13 atmospheres for 38 seconds.  The balloon was pulled back and post IC nitroglycerin scout injections were obtained.  Stenosis was reduced from 90% to 0% with no dissection and good TIMI-3 flow throughout the moderate sized diagonal.  The dilatation system was removed.  Side arm sheath was flushed and secured to the skin to prevent migration and the patient was brought to the holding area for ACT measurement and sheath removal.  We plan to continue him on aggrastat for another 12 hours and continue aspirin and plavix and medical therapy of his associated AODM, hyperlipidemia, exogenous obesity, hypertension.  The patient has successful staged PCI.  He was admitted on July 25, 2001, with several days of unstable angina and was found to have a new 85% proximal RCA stenosis in a dominant vessel.  He had no re-stenosis of his prior stenting of his LAD and "top hat" stenting of his circumflex and OM-3 lesions from prior intervention December 03, 1996.  The RCA was stented with a tandem 4.0/18 and 4.0/8 Bx Velocity cordis stents with a good result.  His further past history includes an acute PMI treated with emergency PTCA May 05, 1994 of the circumflex artery and redilatation with POBA, July 02, 1994.  The third PCI was  December 03, 1996, which HRSA and stenting of his mid circumflex and stenting of his marginal side branch and LAD.  Catheterization diagnosis: 1. Successful stage PTCA and stenting, high grade Dx-2 stenosis in the setting    of recent unstable angina. 2. Coronary artery disease.    a. Remote PMI treated with emergency angioplasty May 05, 1994.    b.  PTCA #2 July 02, 1994 re-stenosis circumflex.    c. High-speed rotational atherectomy, PTCA and stenting circumflex and side       branch OM plus stenting of LAD for progression of disease December 03, 1996.    d. Right coronary artery tandem proximal stents for new lesion in the       setting of unstable angina July 25, 2001. 3. Exogenous obesity. 4. Systemic hypertension. 5. Hyperlipidemia. 6. Adult onset diabetes mellitus. Attending Physician:  Ruta Hinds DD:  07/26/01 TD:  07/26/01 Job: [id number]

## 2011-04-09 NOTE — Discharge Summary (Signed)
[place]  Patient:    Charles [name], Charles [name] Visit Number: [id number] MRN: [id number]          Service Type: MED Location: [phone] Attending Physician:  Ruta Hinds Dictated by:   [facility] [place], [place]. Admit Date:  07/25/2001 Discharge Date: 07/27/2001   CC:         Reather Littler, M.D.  Stanley C. Andrey Campanile, M.D.   Discharge Summary  ADMISSION DIAGNOSES: 1. Unstable angina. 2. Coronary artery disease.    a. Status post acute posterior wall myocardial infarction with emergent       percutaneous transluminal coronary angioplasty May 05, 1994 to the       proximal left circumflex.    b. Status post percutaneous transluminal coronary angioplasty       July 02, 1994 secondary to recurrent stenosis of the left circumflex.    c. Last cardiac catheterization was December 03, 1996 with high-speed       rotational atherectomy and stent to the left circumflex and obtuse       marginal.  There was also percutaneous transluminal coronary angioplasty       stent to the left circumflex.  Also, percutaneous transluminal coronary       angioplasty stent to the left anterior descending.    d. Status post Cardiolite on July 04, 2001 which showed no ischemia and       ejection fraction 50%. 3. Non-insulin-dependent diabetes mellitus. 4. 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 Aldean Ast. 5. History of knee surgery bilaterally. 6. Hyperlipidemia.  DISCHARGE DIAGNOSES: 1. Unstable angina. 2. Coronary artery disease.    a. Status post acute posterior wall myocardial infarction with emergent       percutaneous transluminal coronary angioplasty May 05, 1994 to the       proximal left circumflex.    b. Status post percutaneous transluminal coronary angioplasty       July 02, 1994 secondary to recurrent stenosis of the left circumflex.    c. Last cardiac catheterization was December 03, 1996 with  high-speed       rotational atherectomy and stent to the left circumflex and obtuse       marginal.  There was also percutaneous transluminal coronary angioplasty       stent to the left circumflex.  Also, percutaneous transluminal coronary       angioplasty stent to the left anterior descending.    d. Status post Cardiolite on July 04, 2001 which showed no ischemia and       ejection fraction 50%. 3. Non-insulin-dependent diabetes mellitus. 4. 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 Aldean Ast. 5. History of knee surgery bilaterally. 6. Hyperlipidemia. 7. Status post cardiac catheterization on July 25, 2001 by    Dr. Susa Griffins with percutaneous transluminal coronary angioplasty    stent to the right coronary artery. 8. Status post cardiac catheterization by Dr. Susa Griffins on    July 26, 2001 with percutaneous transluminal coronary angioplasty stent    to the diagonal 2 vessel.  HISTORY OF PRESENT ILLNESS:  [name] is a very pleasant 65 year old white male with a history of coronary artery disease; status post stenting of the left circumflex, stenting to the LAD in January 1998.  He presented on July 25, 2001 with complaints of jaw pain and bilateral arm heaviness.  He stated that he had been experiencing jaw pain and  bilateral arm heaviness for one week.  On the day of admission, it was worse after having high stress and agitation while at work.  He had not used any nitroglycerin at home.  He had had no associated nausea, vomiting, or shortness of breath but "just didnt feel well."  At the time of interview in the ER, he was currently having jaw pain despite having received one sublingual nitroglycerin there in the ER. EKG was without acute change.  He was hemodynamically stable with blood pressure 146/88, heart rate 68.  No significant findings on exam.  At that time, he was seen and evaluated by Dr. Susa Griffins.  We plan to admit him to rule out MI with serial cardiac enzymes.  He will be placed on aspirin, Plavix, heparin, and nitroglycerin and planned for cardiac catheterization later that day by Dr. Alanda Amass.  HOSPITAL COURSE:  On July 25, 2001, Mr. Honeywell underwent cardiac catheterization by Dr. Susa Griffins.  Please see his dictated catheterization report for details.  He performed successful PTCA stenting to the proximal dominant RCA.  He used overlapping stents.  The stenosis had been 85% and was less than 10% residual at the end of case.  Dr. Alanda Amass planned Aggrastat for 18 hours and to continue aspirin and Plavix.  He planned for staged intervention to the diagonal 2.  On July 26, 2001, Mr. Oehlert underwent repeat catheterization by Dr. Susa Griffins with stage PTCA stent to the diagonal 2 proximal lesion. This went from a 90% stenosis to a 0% residual.  He tolerated the procedure well.  Dr. Alanda Amass planned to continue Aggrastat for 12 more hours post procedure, discontinue heparin, and continue aspirin and Plavix.  He will plan for discharge home the following morning stable.  On July 27, 2001, Mr. Sainato is stable without complaint.  He has no further chest pain and has been ambulating without difficulty.  He is afebrile at 97.5, pulse 70, blood pressure 135/75, oxygen saturation 97% on room air. His labs are all stable including renal function post catheterization dye. His groin sites are stable with minimal ecchymosis, no hematoma.  At this time, he was seen and evaluated by Dr. Susa Griffins who deems him stable for discharge home.  HOSPITAL CONSULTATIONS:  None.  HOSPITAL PROCEDURES: 1. Cardiac catheterization on July 25, 2001 by Dr. Susa Griffins with    PTCA stent to the proximal RCA.  This went from an 85% stenosis to a 0%    residual.  Please see his dictated cardiac catheterization report for     further details.  He did  have a residual high-grade lesion in the diagonal    2, which Dr. Alanda Amass planned for staged intervention the following day. 2. Cardiac catheterization on July 26, 2001 by Dr. Susa Griffins with    successful PTCA stent to the proximal diagonal 2 vessel which went from a    90% stenosis to a less than 10% residual.  The patient tolerated the    procedure well without complication.  Please see Dr. Mancel Parsons dictated    cardiac catheterization report for further details.  RADIOLOGY:  Chest x-ray July 25, 2001 shows no active findings.  Mild bibasilar atelectasis and/or scarring.  EKG:  EKG on admission showed normal sinus rhythm with left ventricular hypertrophy and changes consistent with old inferior MI.  There were Qs in leads V1 and V2 which were old.  No acute change.  LABORATORY DATA:  Cardiac enzymes were all negative.  I  do not have the printed out numbers to dictate at this time.  Lipid profile shows cholesterol 151, triglycerides 214, HDL 38, LDL 70.  TSH normal at 1.30.  On July 25, 2001, metabolic profile shows sodium 161, potassium 4.3, chloride 106, glucose 129, BUN 17, creatinine 0.9.  LFTs normal with AST 29, ALT 28, ALP 48, total bilirubin 0.8.  Also on July 25, 2001, PT 13.7, INR 1.0, PTT 25.  Also on July 25, 2001, WBC 6.6, hemoglobin 14.1, hematocrit 40.8, and platelets 242.  All of these values remained stable throughout the hospitalization.  His labs at time of discharge on July 27, 2001 shows WBC 6.5, hemoglobin 12.7, hematocrit 36.4, platelets 170.  Sodium 140, potassium 3.9, BUN 10, creatinine 0.9, and glucose 140.  DISCHARGE MEDICATIONS: 1. Plavix 75 mg once a day for one month. 2. Protonix 40 mg once a day for at least one month. 3. Enteric-coated aspirin 325 mg once a day. 4. Atenolol 25 mg once a day. 5. Altace 10 mg once a day. 6. Lipitor 10 mg once a day. 7. Avandia 4 mg once a day. 8. Do not take your Glucophage until  Sunday.  Starting on Sunday, you may    restart taking 500 mg three times a day.  ACTIVITY:  No strenuous activity, lifting greater than 5 pounds, driving, or sexual activity for three days.  DIET:  Low-salt/low-fat/low-cholesterol diabetic diet.  WOUND CARE:  May gently wash your groin with warm water and soap.  DISCHARGE INSTRUCTIONS:  Call the office at [phone] if any bleeding or increased size or pain to the groin.  FOLLOW-UP:  Keep your appointment that you already have to see Dr. Alanda Amass in the office.  Follow up with Dr. Lucianne Muss for diabetes. Dictated by:   [facility] [place], [place]. Attending Physician:  Ruta Hinds DD:  07/27/01 TD:  07/27/01 Job: [phone] [id number]

## 2011-08-18 LAB — POCT I-STAT, CHEM 8
BUN: 24 — ABNORMAL HIGH
Calcium, Ion: 1.15
Chloride: 103
Creatinine, Ser: 1.1
Glucose, Bld: 110 — ABNORMAL HIGH
HCT: 40
Hemoglobin: 13.6
Potassium: 4.3
Sodium: 138
TCO2: 26

## 2011-08-18 LAB — POCT CARDIAC MARKERS
CKMB, poc: 1.9
Myoglobin, poc: 120
Operator id: 277751
Troponin i, poc: <0.05

## 2011-08-18 LAB — DIFFERENTIAL
Basophils Relative: 0
Monocytes Relative: 10
Neutro Abs: 3.6
Neutrophils Relative %: 57

## 2011-08-18 LAB — CBC
MCHC: 34.3
Platelets: 201
RBC: 4.26
WBC: 6.2

## 2011-08-19 LAB — POCT CARDIAC MARKERS
CKMB, poc: 1.4
Myoglobin, poc: 93.1
Operator id: 277751
Troponin i, poc: <0.05

## 2011-08-23 LAB — COMPREHENSIVE METABOLIC PANEL
ALT: 28
AST: 19
Alkaline Phosphatase: 47
CO2: 26
Calcium: 8.7
GFR calc Af Amer: >60
GFR calc non Af Amer: >60
Glucose, Bld: 125 — ABNORMAL HIGH
Potassium: 3.7
Sodium: 138

## 2011-08-23 LAB — CBC
HCT: 39.2
HCT: 39.5
Hemoglobin: 12.7 — ABNORMAL LOW
MCHC: 33.8
MCHC: 33.9
MCHC: 34.2
MCV: 89
MCV: 90.1
Platelets: 186
Platelets: 189
Platelets: 197
Platelets: 204
Platelets: 208
RBC: 4.41
RBC: 4.5
RDW: 14
RDW: 14.1
RDW: 14.1
RDW: 14.1
WBC: 6.8
WBC: 7.1

## 2011-08-23 LAB — POCT I-STAT, CHEM 8
Calcium, Ion: 0.75 — ABNORMAL LOW
Glucose, Bld: 119 — ABNORMAL HIGH
HCT: 40
Hemoglobin: 13.6
TCO2: 19

## 2011-08-23 LAB — LIPID PANEL
Cholesterol: 125
HDL: 41
LDL Cholesterol: 65
Total CHOL/HDL Ratio: 3
VLDL: 19

## 2011-08-23 LAB — GLUCOSE, CAPILLARY
Glucose-Capillary: 131 — ABNORMAL HIGH
Glucose-Capillary: 133 — ABNORMAL HIGH
Glucose-Capillary: 145 — ABNORMAL HIGH
Glucose-Capillary: 147 — ABNORMAL HIGH
Glucose-Capillary: 156 — ABNORMAL HIGH
Glucose-Capillary: 163 — ABNORMAL HIGH
Glucose-Capillary: 182 — ABNORMAL HIGH

## 2011-08-23 LAB — DIFFERENTIAL
Basophils Absolute: 0
Basophils Relative: 1
Eosinophils Absolute: 0.1
Monocytes Relative: 9
Neutro Abs: 4.4
Neutrophils Relative %: 65

## 2011-08-23 LAB — CK TOTAL AND CKMB (NOT AT ARMC)
CK, MB: 2.2
Relative Index: 2.2
Total CK: 102
Total CK: 153

## 2011-08-23 LAB — PROTIME-INR
INR: 1
Prothrombin Time: 12.9
Prothrombin Time: 15

## 2011-08-23 LAB — CARDIAC PANEL(CRET KIN+CKTOT+MB+TROPI)
CK, MB: 2.7
Relative Index: 2.1
Total CK: 131
Troponin I: 0.02

## 2011-08-23 LAB — BASIC METABOLIC PANEL
BUN: 14
Chloride: 102
GFR calc Af Amer: >60
GFR calc non Af Amer: >60
GFR calc non Af Amer: >60
Potassium: 3.5
Potassium: 3.8
Sodium: 137

## 2011-08-23 LAB — HEPARIN LEVEL (UNFRACTIONATED)
Heparin Unfractionated: 0.26 — ABNORMAL LOW
Heparin Unfractionated: 0.54
Heparin Unfractionated: 0.66
Heparin Unfractionated: 0.84 — ABNORMAL HIGH

## 2011-08-23 LAB — MAGNESIUM: Magnesium: 1.9

## 2011-08-23 LAB — TSH: TSH: 1.837

## 2011-08-23 LAB — APTT: aPTT: 23 — ABNORMAL LOW

## 2011-08-23 LAB — POCT CARDIAC MARKERS
CKMB, poc: 1.9
Myoglobin, poc: 104
Myoglobin, poc: 116
Troponin i, poc: <0.05

## 2011-08-23 LAB — B-NATRIURETIC PEPTIDE (CONVERTED LAB): Pro B Natriuretic peptide (BNP): 128 — ABNORMAL HIGH

## 2011-08-24 LAB — PROTIME-INR
INR: 2.3 — ABNORMAL HIGH
INR: 2.5 — ABNORMAL HIGH
Prothrombin Time: 26.4 — ABNORMAL HIGH

## 2011-08-24 LAB — CARDIAC PANEL(CRET KIN+CKTOT+MB+TROPI)
CK, MB: 1.6
Relative Index: INVALID
Total CK: 127
Total CK: 94
Troponin I: <0.01

## 2011-08-24 LAB — CBC
HCT: 36.7 — ABNORMAL LOW
Hemoglobin: 13.4
MCHC: 33
Platelets: 190
RBC: 4.52
WBC: 5.8

## 2011-08-24 LAB — BASIC METABOLIC PANEL
BUN: 17
Chloride: 106
Potassium: 4.5
Sodium: 140

## 2011-08-24 LAB — CK TOTAL AND CKMB (NOT AT ARMC)
CK, MB: 2.3
Relative Index: 1.4

## 2011-08-24 LAB — COMPREHENSIVE METABOLIC PANEL
ALT: 24
Albumin: 3.8
Alkaline Phosphatase: 43
Potassium: 4.8
Sodium: 139
Total Protein: 6.5

## 2011-08-24 LAB — URINALYSIS, ROUTINE W REFLEX MICROSCOPIC
Ketones, ur: NEGATIVE
Nitrite: NEGATIVE
Specific Gravity, Urine: 1.021
pH: 5

## 2011-08-24 LAB — DIFFERENTIAL
Basophils Relative: 1
Eosinophils Absolute: 0.1
Monocytes Absolute: 0.6
Monocytes Relative: 8

## 2011-08-24 LAB — HEMOGLOBIN A1C: Mean Plasma Glucose: 128

## 2011-08-24 LAB — B-NATRIURETIC PEPTIDE (CONVERTED LAB): Pro B Natriuretic peptide (BNP): 240 — ABNORMAL HIGH

## 2011-08-24 LAB — GLUCOSE, CAPILLARY: Glucose-Capillary: 124 — ABNORMAL HIGH

## 2011-08-24 LAB — TSH: TSH: 1.349

## 2011-08-31 LAB — CBC
Hemoglobin: 12.1 — ABNORMAL LOW
MCHC: 34.1
MCV: 88.9
Platelets: 228
RBC: 3.98 — ABNORMAL LOW
RDW: 13.6
RDW: 14
WBC: 6.3

## 2011-08-31 LAB — BASIC METABOLIC PANEL
BUN: 12
CO2: 29
Calcium: 8.9
Calcium: 9.1
Chloride: 103
Creatinine, Ser: 0.95
Creatinine, Ser: 1
GFR calc non Af Amer: >60
Glucose, Bld: 138 — ABNORMAL HIGH
Glucose, Bld: 160 — ABNORMAL HIGH

## 2011-08-31 LAB — CARDIAC PANEL(CRET KIN+CKTOT+MB+TROPI)
Relative Index: INVALID
Troponin I: 0.05

## 2011-08-31 LAB — PROTIME-INR: Prothrombin Time: 13.3

## 2011-10-27 ENCOUNTER — Telehealth: Payer: Self-pay | Admitting: Internal Medicine

## 2011-10-27 NOTE — Telephone Encounter (Signed)
Dr Leone Payor please advise the colon/path are both attached as hyperlinks under the 2007 procedure

## 2011-10-28 NOTE — Telephone Encounter (Signed)
Patient advised and recall placed

## 2011-10-28 NOTE — Telephone Encounter (Signed)
He had an inflammatory polyp so that is not pre-cancerous and according to guidelines next routine colonoscopy 2017.  He may schedule an REV to discuss (and would need one to set up colonoscopy due to Plavix and stent)

## 2012-11-17 ENCOUNTER — Other Ambulatory Visit (HOSPITAL_COMMUNITY): Payer: Self-pay | Admitting: Cardiovascular Disease

## 2012-11-17 DIAGNOSIS — L97909 Non-pressure chronic ulcer of unspecified part of unspecified lower leg with unspecified severity: Secondary | ICD-10-CM

## 2012-11-17 DIAGNOSIS — L97209 Non-pressure chronic ulcer of unspecified calf with unspecified severity: Secondary | ICD-10-CM

## 2012-11-17 DIAGNOSIS — I83009 Varicose veins of unspecified lower extremity with ulcer of unspecified site: Secondary | ICD-10-CM

## 2012-11-21 ENCOUNTER — Other Ambulatory Visit (HOSPITAL_COMMUNITY): Payer: Self-pay | Admitting: Cardiovascular Disease

## 2012-11-21 DIAGNOSIS — I83009 Varicose veins of unspecified lower extremity with ulcer of unspecified site: Secondary | ICD-10-CM

## 2012-11-21 DIAGNOSIS — L97909 Non-pressure chronic ulcer of unspecified part of unspecified lower leg with unspecified severity: Secondary | ICD-10-CM

## 2012-11-27 ENCOUNTER — Ambulatory Visit (HOSPITAL_COMMUNITY)
Admission: RE | Admit: 2012-11-27 | Discharge: 2012-11-27 | Disposition: A | Discharge status: Home / Self Care | Payer: BC Managed Care – PPO | Source: Ambulatory Visit | Attending: Cardiovascular Disease | Admitting: Cardiovascular Disease

## 2012-11-27 DIAGNOSIS — L97909 Non-pressure chronic ulcer of unspecified part of unspecified lower leg with unspecified severity: Secondary | ICD-10-CM

## 2012-11-27 DIAGNOSIS — I83009 Varicose veins of unspecified lower extremity with ulcer of unspecified site: Secondary | ICD-10-CM | POA: Insufficient documentation

## 2012-11-27 NOTE — Progress Notes (Signed)
BLE venous duplex completed. Kaushik Maul D  

## 2012-12-26 ENCOUNTER — Other Ambulatory Visit (HOSPITAL_COMMUNITY): Payer: Self-pay | Admitting: Cardiovascular Disease

## 2012-12-26 DIAGNOSIS — R079 Chest pain, unspecified: Secondary | ICD-10-CM

## 2013-01-02 ENCOUNTER — Ambulatory Visit (HOSPITAL_COMMUNITY)
Admission: RE | Admit: 2013-01-02 | Discharge: 2013-01-02 | Disposition: A | Discharge status: Home / Self Care | Payer: BC Managed Care – PPO | Source: Ambulatory Visit | Attending: Cardiovascular Disease | Admitting: Cardiovascular Disease

## 2013-01-02 DIAGNOSIS — R079 Chest pain, unspecified: Secondary | ICD-10-CM

## 2013-01-02 MED ORDER — REGADENOSON 0.4 MG/5ML IV SOLN
0.4000 mg | Freq: Once | INTRAVENOUS | Status: AC
  Administered 2013-01-02: 0.4 mg via INTRAVENOUS

## 2013-01-02 MED ORDER — TECHNETIUM TC 99M SESTAMIBI GENERIC - CARDIOLITE
11.0000 | Freq: Once | INTRAVENOUS | Status: AC | PRN
  Administered 2013-01-02: 11 via INTRAVENOUS

## 2013-01-02 MED ORDER — TECHNETIUM TC 99M SESTAMIBI GENERIC - CARDIOLITE
31.0000 | Freq: Once | INTRAVENOUS | Status: AC | PRN
  Administered 2013-01-02: 31 via INTRAVENOUS

## 2013-01-02 NOTE — Procedures (Addendum)
[facility] [place] [facility] [address] [place] [address] [place] [address] [phone]  Cardiology Nuclear Med Study  Charles [name] is a 67 y.o. male     MRN : [id number]     DOB: Dec 25, 1945  Procedure Date: 01/02/2013  Nuclear Med Background Indication for Stress Test:  Stent Patency, PTCA Patency and Abnormal EKG History:  Prior MI 1995,  Stent Placement 1998 and 2008, Pacer Cardiac Risk Factors: Family History - CAD, History of Smoking, Hypertension, LBBB, Lipids, NIDDM and Obesity  Symptoms:  Fatigue and Light-Headedness   Nuclear Pre-Procedure Caffeine/Decaff Intake:  10:00pm NPO After: 8:00am   IV Site: R Antecubital  IV 0.9% NS with Angio Cath:  22g  Chest Size (in):  2X IV Started by: Koren Shiver, CNMT  Height: 5\' 9"  (1.753 m)  Cup Size: n/a  BMI:  Body mass index is 41.33 kg/(m^2). Weight:  280 lb (127.007 kg)   Tech Comments: n/a    Nuclear Med Study 1 or 2 day study: 1 day  Stress Test Type:  Lexiscan  Order Authorizing Provider:  Susa Griffins, MD   Resting Radionuclide: Technetium 46m Sestamibi  Resting Radionuclide Dose: 11.0 mCi   Stress Radionuclide:  Technetium 11m Sestamibi  Stress Radionuclide Dose: 31.0 mCi           Stress Protocol Rest HR: 65 Stress HR:  67  Rest BP:  313/75 Stress BP:  135/66  Exercise Time (min): n/a METS: n/a   Predicted Max HR: 154 bpm % Max HR: 43.51 bpm Rate Pressure Product: 9045  Dose of Adenosine (mg):  n/a Dose of Lexiscan: 0.4 mg  Dose of Atropine (mg): n/a Dose of Dobutamine: n/a mcg/kg/min (at max HR)  Stress Test Technologist: Esperanza Sheets, CCT Nuclear Technologist: Gonzella Lex, CNMT   Rest Procedure:  Myocardial perfusion imaging was performed at rest 45 minutes following the intravenous administration of Technetium 3m Sestamibi. Stress Procedure:  The patient received IV Lexiscan 0.4 mg over 15-seconds.  Technetium 50m Sestamibi injected at 30-seconds.  There were no  significant changes with Lexiscan.  Quantitative spect images were obtained after a 45 minute delay.  Transient Ischemic Dilatation (Normal <1.22):  0.99 Lung/Heart Ratio (Normal <0.45):  0.39 QGS EDV:  137 ml QGS ESV:  73 ml LV Ejection Fraction: 47%  Rest ECG: NSR with anterolateral and inferior T wave changes  Stress ECG: No significant change from baseline ECG  QPS Raw Data Images:  Normal; no motion artifact; normal heart/lung ratio. Stress Images:  Fixed basal to mid inferolateral defect Rest Images:  Fixed basal to mid inferolateral defect Subtraction (SDS):  No evidence of ischemia.  Impression Exercise Capacity:  Lexiscan with no exercise. BP Response:  Normal blood pressure response. Clinical Symptoms:  No significant symptoms noted. ECG Impression:  No significant ECG changes with Lexiscan. Comparison with Prior Nuclear Study: *Comparison to study in 2009 demonstrates no changes.  Overall Impression:  Low risk stress nuclear study. Fixed inferolateral defect, likely scar. No reversible ischemia.  LV Wall Motion:  Inferolateral hypokinesis. LVEF 47%.  Chrystie Nose, MD, [facility] Attending Cardiologist [facility]  Chrystie Nose, MD  01/02/2013 1:24 PM

## 2013-02-15 ENCOUNTER — Other Ambulatory Visit (HOSPITAL_COMMUNITY): Payer: Self-pay | Admitting: Cardiovascular Disease

## 2013-02-15 DIAGNOSIS — I70219 Atherosclerosis of native arteries of extremities with intermittent claudication, unspecified extremity: Secondary | ICD-10-CM

## 2013-02-15 DIAGNOSIS — I714 Abdominal aortic aneurysm, without rupture: Secondary | ICD-10-CM

## 2013-03-10 ENCOUNTER — Encounter: Payer: Self-pay | Admitting: Pharmacist Clinician (PhC)/ Clinical Pharmacy Specialist

## 2013-03-10 DIAGNOSIS — I4891 Unspecified atrial fibrillation: Secondary | ICD-10-CM

## 2013-03-10 DIAGNOSIS — Z7901 Long term (current) use of anticoagulants: Secondary | ICD-10-CM | POA: Insufficient documentation

## 2013-03-10 DIAGNOSIS — I480 Paroxysmal atrial fibrillation: Secondary | ICD-10-CM | POA: Insufficient documentation

## 2013-03-10 HISTORY — DX: Long term (current) use of anticoagulants: Z79.01

## 2013-03-13 ENCOUNTER — Encounter (HOSPITAL_COMMUNITY): Payer: BC Managed Care – PPO

## 2013-04-02 ENCOUNTER — Ambulatory Visit (HOSPITAL_COMMUNITY)
Admission: RE | Admit: 2013-04-02 | Discharge: 2013-04-02 | Disposition: A | Discharge status: Home / Self Care | Payer: BC Managed Care – PPO | Source: Ambulatory Visit | Attending: Cardiovascular Disease | Admitting: Cardiovascular Disease

## 2013-04-02 DIAGNOSIS — I714 Abdominal aortic aneurysm, without rupture, unspecified: Secondary | ICD-10-CM | POA: Insufficient documentation

## 2013-04-02 DIAGNOSIS — I70219 Atherosclerosis of native arteries of extremities with intermittent claudication, unspecified extremity: Secondary | ICD-10-CM | POA: Insufficient documentation

## 2013-04-02 NOTE — Progress Notes (Signed)
Lower extremity arterial duplex complete. GMG 

## 2013-04-02 NOTE — Progress Notes (Signed)
Duplex abdominal aorta complete. GMG

## 2013-04-30 ENCOUNTER — Ambulatory Visit: Payer: BC Managed Care – PPO | Admitting: Pharmacist Clinician (PhC)/ Clinical Pharmacy Specialist

## 2013-05-07 ENCOUNTER — Telehealth: Payer: Self-pay | Admitting: Cardiovascular Disease

## 2013-05-07 MED ORDER — WARFARIN SODIUM 5 MG PO TABS: 5.0000 mg | ORAL_TABLET | Freq: Every day | ORAL | Status: DC

## 2013-05-07 MED ORDER — DILTIAZEM HCL ER 120 MG PO CP24: 120.0000 mg | ORAL_CAPSULE | Freq: Every day | ORAL | Status: DC

## 2013-05-07 NOTE — Telephone Encounter (Signed)
Pt informed refills will be sent and of refill process.  Pt verbalized understanding and agreed w/ plan.  Refill(s) sent to pharmacy.  Lisinopril inadvertently sent and is on current med list.

## 2013-05-07 NOTE — Telephone Encounter (Signed)
Need new prescription called in to his mail order pharmacy-Needs his Warfarin #90 and Diltiazem #90-Call to [phone]!

## 2013-05-09 ENCOUNTER — Telehealth: Payer: Self-pay | Admitting: Cardiovascular Disease

## 2013-05-09 NOTE — Telephone Encounter (Signed)
Need to verify the directions on his Warfarin! Reference#[id number]

## 2013-05-10 NOTE — Telephone Encounter (Signed)
Dosing clarified for mail order pharmacy.

## 2013-05-23 ENCOUNTER — Telehealth: Payer: Self-pay | Admitting: Cardiovascular Disease

## 2013-05-23 MED ORDER — DILTIAZEM HCL ER 120 MG PO CP24: 120.0000 mg | ORAL_CAPSULE | Freq: Every day | ORAL | Status: DC

## 2013-05-23 NOTE — Telephone Encounter (Signed)
Charles [name] is needing a new prescription sent to [facility] for his Diltiazem .[name] Thanks

## 2013-05-23 NOTE — Telephone Encounter (Signed)
Called in diltiazem 120 mg #15 0 refills to [facility],

## 2013-07-03 ENCOUNTER — Other Ambulatory Visit: Payer: Self-pay | Admitting: *Deleted

## 2013-07-03 ENCOUNTER — Other Ambulatory Visit: Payer: Self-pay | Admitting: Cardiovascular Disease

## 2013-07-03 ENCOUNTER — Ambulatory Visit (INDEPENDENT_AMBULATORY_CARE_PROVIDER_SITE_OTHER): Payer: BC Managed Care – PPO | Admitting: Pharmacist Clinician (PhC)/ Clinical Pharmacy Specialist

## 2013-07-03 DIAGNOSIS — Z7901 Long term (current) use of anticoagulants: Secondary | ICD-10-CM

## 2013-07-03 DIAGNOSIS — R0600 Dyspnea, unspecified: Secondary | ICD-10-CM

## 2013-07-03 DIAGNOSIS — I4891 Unspecified atrial fibrillation: Secondary | ICD-10-CM

## 2013-07-03 LAB — PACEMAKER DEVICE OBSERVATION

## 2013-07-04 LAB — CBC WITH DIFFERENTIAL/PLATELET
Eosinophils Relative: 3 % (ref 0–5)
HCT: 39.7 % (ref 39.0–52.0)
Hemoglobin: 13.5 g/dL (ref 13.0–17.0)
Lymphocytes Relative: 21 % (ref 12–46)
Lymphs Abs: 1.7 10*3/uL (ref 0.7–4.0)
MCH: 29.4 pg (ref 26.0–34.0)
MCV: 86.5 fL (ref 78.0–100.0)
Monocytes Relative: 8 % (ref 3–12)
Platelets: 248 10*3/uL (ref 150–400)
RBC: 4.59 MIL/uL (ref 4.22–5.81)
WBC: 8.1 10*3/uL (ref 4.0–10.5)

## 2013-07-04 LAB — COMPREHENSIVE METABOLIC PANEL
ALT: 22 U/L (ref 0–53)
CO2: 30 mEq/L (ref 19–32)
Calcium: 9.3 mg/dL (ref 8.4–10.5)
Chloride: 102 mEq/L (ref 96–112)
Creat: 1.31 mg/dL (ref 0.50–1.35)
Glucose, Bld: 102 mg/dL — ABNORMAL HIGH (ref 70–99)
Total Protein: 7.1 g/dL (ref 6.0–8.3)

## 2013-07-04 LAB — BRAIN NATRIURETIC PEPTIDE: Brain Natriuretic Peptide: 77 pg/mL (ref 0.0–100.0)

## 2013-07-05 LAB — POCT INR: INR: 2.3

## 2013-07-10 ENCOUNTER — Ambulatory Visit (HOSPITAL_COMMUNITY)
Admission: RE | Admit: 2013-07-10 | Discharge: 2013-07-10 | Disposition: A | Discharge status: Home / Self Care | Payer: BC Managed Care – PPO | Source: Ambulatory Visit | Attending: Cardiology | Admitting: Cardiology

## 2013-07-10 DIAGNOSIS — R0600 Dyspnea, unspecified: Secondary | ICD-10-CM

## 2013-07-10 DIAGNOSIS — I2510 Atherosclerotic heart disease of native coronary artery without angina pectoris: Secondary | ICD-10-CM | POA: Insufficient documentation

## 2013-07-10 DIAGNOSIS — I4891 Unspecified atrial fibrillation: Secondary | ICD-10-CM | POA: Insufficient documentation

## 2013-07-10 DIAGNOSIS — G4733 Obstructive sleep apnea (adult) (pediatric): Secondary | ICD-10-CM | POA: Insufficient documentation

## 2013-07-10 DIAGNOSIS — R0609 Other forms of dyspnea: Secondary | ICD-10-CM | POA: Insufficient documentation

## 2013-07-10 DIAGNOSIS — E119 Type 2 diabetes mellitus without complications: Secondary | ICD-10-CM | POA: Insufficient documentation

## 2013-07-10 DIAGNOSIS — E669 Obesity, unspecified: Secondary | ICD-10-CM | POA: Insufficient documentation

## 2013-07-10 DIAGNOSIS — E785 Hyperlipidemia, unspecified: Secondary | ICD-10-CM | POA: Insufficient documentation

## 2013-07-10 DIAGNOSIS — I10 Essential (primary) hypertension: Secondary | ICD-10-CM | POA: Insufficient documentation

## 2013-07-10 DIAGNOSIS — R0989 Other specified symptoms and signs involving the circulatory and respiratory systems: Secondary | ICD-10-CM | POA: Insufficient documentation

## 2013-07-10 NOTE — Progress Notes (Signed)
Charles [name]   2D echo completed 07/10/2013.   Cindy Jennene Downie, RDCS  

## 2013-07-12 ENCOUNTER — Encounter: Payer: Self-pay | Admitting: Cardiovascular Disease

## 2013-08-07 ENCOUNTER — Ambulatory Visit (INDEPENDENT_AMBULATORY_CARE_PROVIDER_SITE_OTHER): Payer: BC Managed Care – PPO | Admitting: Pharmacist Clinician (PhC)/ Clinical Pharmacy Specialist

## 2013-08-07 VITALS — BP 142/86 | HR 68

## 2013-08-07 DIAGNOSIS — Z7901 Long term (current) use of anticoagulants: Secondary | ICD-10-CM

## 2013-08-07 DIAGNOSIS — I4891 Unspecified atrial fibrillation: Secondary | ICD-10-CM

## 2013-09-05 ENCOUNTER — Ambulatory Visit: Payer: BC Managed Care – PPO | Admitting: Pharmacist Clinician (PhC)/ Clinical Pharmacy Specialist

## 2013-09-11 ENCOUNTER — Ambulatory Visit: Payer: BC Managed Care – PPO | Admitting: Pharmacist Clinician (PhC)/ Clinical Pharmacy Specialist

## 2013-09-17 ENCOUNTER — Ambulatory Visit (INDEPENDENT_AMBULATORY_CARE_PROVIDER_SITE_OTHER): Payer: BC Managed Care – PPO | Admitting: Pharmacist Clinician (PhC)/ Clinical Pharmacy Specialist

## 2013-09-17 VITALS — BP 160/88 | HR 72

## 2013-09-17 DIAGNOSIS — Z7901 Long term (current) use of anticoagulants: Secondary | ICD-10-CM

## 2013-09-17 DIAGNOSIS — I4891 Unspecified atrial fibrillation: Secondary | ICD-10-CM

## 2013-10-04 ENCOUNTER — Other Ambulatory Visit: Payer: Self-pay

## 2013-10-04 MED ORDER — EZETIMIBE 10 MG PO TABS: 10.0000 mg | ORAL_TABLET | Freq: Every day | ORAL | Status: DC

## 2013-10-04 MED ORDER — PITAVASTATIN CALCIUM 1 MG PO TABS: 1.0000 mg | ORAL_TABLET | Freq: Every day | ORAL | Status: DC

## 2013-10-04 NOTE — Telephone Encounter (Signed)
Rx was sent to pharmacy electronically. 

## 2013-10-12 ENCOUNTER — Encounter: Payer: Self-pay | Admitting: Internal Medicine

## 2013-10-12 ENCOUNTER — Telehealth: Payer: Self-pay | Admitting: Internal Medicine

## 2013-10-12 NOTE — Telephone Encounter (Signed)
10-12-13 sent scheduling letter for pt to et up device fu with allred, former [facility] pt/mt

## 2013-10-16 ENCOUNTER — Ambulatory Visit: Payer: BC Managed Care – PPO | Admitting: Pharmacist Clinician (PhC)/ Clinical Pharmacy Specialist

## 2013-10-22 ENCOUNTER — Other Ambulatory Visit: Payer: Self-pay | Admitting: *Deleted

## 2013-10-22 MED ORDER — HYDROCHLOROTHIAZIDE 25 MG PO TABS: 25.0000 mg | ORAL_TABLET | Freq: Every day | ORAL | Status: DC

## 2013-10-29 ENCOUNTER — Other Ambulatory Visit: Payer: Self-pay | Admitting: Pharmacist Clinician (PhC)/ Clinical Pharmacy Specialist

## 2013-10-29 ENCOUNTER — Telehealth: Payer: Self-pay | Admitting: Cardiovascular Disease

## 2013-10-29 ENCOUNTER — Ambulatory Visit (INDEPENDENT_AMBULATORY_CARE_PROVIDER_SITE_OTHER): Payer: BC Managed Care – PPO | Admitting: Pharmacist Clinician (PhC)/ Clinical Pharmacy Specialist

## 2013-10-29 DIAGNOSIS — Z7901 Long term (current) use of anticoagulants: Secondary | ICD-10-CM

## 2013-10-29 DIAGNOSIS — I4891 Unspecified atrial fibrillation: Secondary | ICD-10-CM

## 2013-10-29 MED ORDER — LISINOPRIL 10 MG PO TABS: 10.0000 mg | ORAL_TABLET | Freq: Every day | ORAL | Status: DC

## 2013-10-29 MED ORDER — HYDROCHLOROTHIAZIDE 25 MG PO TABS: 25.0000 mg | ORAL_TABLET | Freq: Every day | ORAL | Status: DC

## 2013-10-29 NOTE — Telephone Encounter (Signed)
Pt ran out, not sure when, calling to [facility] so can restart today.

## 2013-10-29 NOTE — Telephone Encounter (Signed)
Need prescription for Hydrochlorothiazide Tablets 25 mg #90 and 3  Refills. ZOX#[id number]

## 2013-10-29 NOTE — Telephone Encounter (Signed)
Is going to need a new prescription sent to [facility]([phone]) for his Lisinopril 10mg  so that he can he can get it refilled... Former Dr. Alanda Amass patient .[name] Please call if you have any questions     Thanks

## 2013-10-29 NOTE — Telephone Encounter (Signed)
Rx was sent to pharmacy electronically. 

## 2013-10-31 ENCOUNTER — Other Ambulatory Visit: Payer: Self-pay | Admitting: *Deleted

## 2013-11-05 ENCOUNTER — Other Ambulatory Visit: Payer: Self-pay

## 2013-11-05 MED ORDER — PITAVASTATIN CALCIUM 1 MG PO TABS: 1.0000 mg | ORAL_TABLET | Freq: Every day | ORAL | Status: DC

## 2013-11-05 MED ORDER — EZETIMIBE 10 MG PO TABS: 10.0000 mg | ORAL_TABLET | Freq: Every day | ORAL | Status: DC

## 2013-11-05 NOTE — Telephone Encounter (Signed)
Rx was sent to pharmacy electronically. 

## 2013-11-19 ENCOUNTER — Other Ambulatory Visit: Payer: Self-pay | Admitting: *Deleted

## 2013-11-19 MED ORDER — EZETIMIBE 10 MG PO TABS: 10.0000 mg | ORAL_TABLET | Freq: Every day | ORAL | Status: DC

## 2013-11-19 MED ORDER — PITAVASTATIN CALCIUM 1 MG PO TABS: 1.0000 mg | ORAL_TABLET | Freq: Every day | ORAL | Status: DC

## 2013-11-26 ENCOUNTER — Ambulatory Visit (INDEPENDENT_AMBULATORY_CARE_PROVIDER_SITE_OTHER): Payer: BC Managed Care – PPO | Admitting: Pharmacist Clinician (PhC)/ Clinical Pharmacy Specialist

## 2013-11-26 VITALS — BP 166/90 | HR 76

## 2013-11-26 DIAGNOSIS — Z7901 Long term (current) use of anticoagulants: Secondary | ICD-10-CM

## 2013-11-26 DIAGNOSIS — I4891 Unspecified atrial fibrillation: Secondary | ICD-10-CM

## 2013-11-26 LAB — POCT INR: INR: 2.4

## 2013-11-26 NOTE — Progress Notes (Signed)
Pt has been taking 1/2 tab of lisinopril, advised him to increase to 1 tablet daily

## 2013-11-30 ENCOUNTER — Encounter: Payer: Self-pay | Admitting: Internal Medicine

## 2013-11-30 ENCOUNTER — Other Ambulatory Visit: Payer: Self-pay | Admitting: *Deleted

## 2013-11-30 DIAGNOSIS — G4733 Obstructive sleep apnea (adult) (pediatric): Secondary | ICD-10-CM | POA: Insufficient documentation

## 2013-11-30 MED ORDER — EZETIMIBE 10 MG PO TABS: 10.0000 mg | ORAL_TABLET | Freq: Every day | ORAL | Status: DC

## 2013-12-03 ENCOUNTER — Telehealth: Payer: Self-pay | Admitting: Internal Medicine

## 2013-12-03 ENCOUNTER — Encounter: Payer: Self-pay | Admitting: Internal Medicine

## 2013-12-03 ENCOUNTER — Ambulatory Visit (INDEPENDENT_AMBULATORY_CARE_PROVIDER_SITE_OTHER): Payer: BC Managed Care – PPO | Admitting: Internal Medicine

## 2013-12-03 VITALS — BP 165/87 | HR 68 | Ht 69.0 in | Wt 280.8 lb

## 2013-12-03 DIAGNOSIS — I495 Sick sinus syndrome: Secondary | ICD-10-CM

## 2013-12-03 DIAGNOSIS — I4891 Unspecified atrial fibrillation: Secondary | ICD-10-CM

## 2013-12-03 LAB — MDC_IDC_ENUM_SESS_TYPE_INCLINIC
Battery Voltage: 2.85 V
Date Time Interrogation Session: 20150112090143
Implantable Pulse Generator Model: 5826
Implantable Pulse Generator Serial Number: 1266370
Lead Channel Impedance Value: 587 Ohm
Lead Channel Pacing Threshold Amplitude: 0.75 V
Lead Channel Pacing Threshold Pulse Width: 0.4 ms
Lead Channel Pacing Threshold Pulse Width: 0.4 ms
Lead Channel Sensing Intrinsic Amplitude: 12 mV
Lead Channel Setting Pacing Amplitude: 2.5 V
Lead Channel Setting Sensing Sensitivity: 2 mV
MDC IDC MSMT BATTERY IMPEDANCE: 1400 Ohm
MDC IDC MSMT LEADCHNL RA IMPEDANCE VALUE: 482 Ohm
MDC IDC MSMT LEADCHNL RA PACING THRESHOLD AMPLITUDE: 1 V
MDC IDC MSMT LEADCHNL RA SENSING INTR AMPL: 1.1 mV
MDC IDC SET LEADCHNL RV PACING AMPLITUDE: 2.25 V
MDC IDC SET LEADCHNL RV PACING PULSEWIDTH: 0.4 ms
MDC IDC STAT BRADY RA PERCENT PACED: 91 %
MDC IDC STAT BRADY RV PERCENT PACED: 2.8 %

## 2013-12-03 MED ORDER — LISINOPRIL 20 MG PO TABS: 20.0000 mg | ORAL_TABLET | Freq: Every day | ORAL | Status: DC

## 2013-12-03 NOTE — Telephone Encounter (Signed)
New Message  Please send in an order to have LABS completed. Appt scheduled for 08:45 am 12/04/2013  [facility]  [phone]  Attention Sherri

## 2013-12-03 NOTE — Patient Instructions (Signed)
Your physician wants you to follow-up in: 3 months with Truitt Merle, NP and 6 months with Dr Vallery Ridge will receive a reminder letter in the mail two months in advance. If you don't receive a letter, please call our office to schedule the follow-up appointment.   Your physician has recommended you make the following change in your medication:  1) Stop Aspirin 2) Increase Lisinopril to 20mg  daily---ok to take 2 of the 10mg  tablets daily until you run out then pick up new prescription   Your physician recommends that you return for lab work with your PCP  Draw a fasting lipid panel, BMP and liver panel and fax results to [phone]  Atn Dr [name]  DX: [phone] and [phone]

## 2013-12-04 NOTE — Telephone Encounter (Signed)
Patient has order with him

## 2013-12-05 ENCOUNTER — Other Ambulatory Visit: Payer: Self-pay | Admitting: *Deleted

## 2013-12-09 ENCOUNTER — Encounter: Payer: Self-pay | Admitting: Internal Medicine

## 2013-12-09 NOTE — Progress Notes (Signed)
Charles Ravel, MD: Primary Cardiologist:  Previously Dr Exie Parody is a 68 y.o. male with a h/o bradycardia sp PPM (MDT) by Dr Amil Amen who presents today to establish care in the Electrophysiology device clinic.   The patient reports doing very well since having a pacemaker implanted and remains very active despite his age.   Today, he  denies symptoms of palpitations, chest pain, shortness of breath, orthopnea, PND, lower extremity edema, dizziness, presyncope, syncope, or neurologic sequela.  The patientis tolerating medications without difficulties and is otherwise without complaint today.   Past Medical History  Diagnosis Date  . Hypertension   . Coronary artery disease     last cath 2008,  remote PCI of the LAD with Lcx/OM bifurcation stenting and proximal RCA stenting  . Obstructive chronic bronchitis   . Other dysphagia   . Osteoarthritis, knee   . Dyslipidemia   . Exogenous obesity     severe  . Hx of colonic polyps   . Nephrolithiasis   . Long term (current) use of anticoagulants   . OSA (obstructive sleep apnea)   . Chronic venous insufficiency     with prior venous stasis ulcers  . Diabetes mellitus, type II     AODM  . Statin intolerance     Hx of. Now tolerating Zetia & Livalo well.   . Weakness     In bilateral legs. Duplex performed 04/02/13   . Fatigue   . DOE (dyspnea on exertion)     TEE 07/10/13 - LV EF 40-45%  . History of nuclear stress test 01/02/13    OVERALL IMPRESSION: Low risk stress nuclear study. Fixed inferolateral defect, likely scar. No reversible ischemia. LV wall motion: Inferolateral hypokinesis. LVEF 47%. (Hilty)  . MI (myocardial infarction) 1995  . 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 Jude Zephyr XL DR 5826, dual chamber, rate responsive. No arrhythmias recorded and he has an excellent threshold.  . SSS (sick sinus syndrome)   . PAF (paroxysmal atrial fibrillation)   . LBBB (left bundle branch block)     He has  developed a native LBBB which was seen on his last visit of March 2014 (From OV note 07/03/13)   . Regional wall motion abnormality of heart     By TEE 07/10/13  . Chronic systolic dysfunction of left ventricle     EF40-45%  . Left atrial dilatation     moderate by echo   Past Surgical History  Procedure Laterality Date  . Pacemaker insertion  08/28/08    St. Jude Zephyr XL DR 5826, dual chamber, rate responsive. No arrhythmias recorded and he has an excellent threshold.  . Coronary angioplasty with stent placement  1998 & 2008    Last cath in 2008 and he has had remote interventions with LAD stenting, Cx/OM bifurcation stenting, and proximal right coronary stent    History   Social History  . Marital Status: Married    Spouse Name: N/A    Number of Children: N/A  . Years of Education: N/A   Occupational History  . Not on file.   Social History Main Topics  . Smoking status: Former Smoker    Types: Cigarettes    Quit date: 11/23/1987  . Smokeless tobacco: Not on file  . Alcohol Use: No  . Drug Use: No  . Sexual Activity: Not on file   Other Topics Concern  . Not on file   Social History Narrative  Married   Works at [employer] and also as Development worker, international aid at the [employer] History  Problem Relation Age of Onset  . CAD    . Hypertension    . Obesity      Allergies  Allergen Reactions  . Codeine   . Oxytetracycline   . Statins Other (See Comments)    Muscle ache    Current Outpatient Prescriptions  Medication Sig Dispense Refill  . collagenase (SANTYL) ointment Apply 1 application topically as needed.      [name] exenatide (BYETTA) 5 MCG/0.02ML SOPN injection Inject 5 mcg into the skin 2 (two) times daily with a meal.      . ezetimibe (ZETIA) 10 MG tablet Take 1 tablet (10 mg total) by mouth daily.  90 tablet  3  . glimepiride (AMARYL) 4 MG tablet Take 4 mg by mouth 2 (two) times daily.      . hydrochlorothiazide (HYDRODIURIL) 25 MG tablet Take 1  tablet (25 mg total) by mouth daily.  90 tablet  1  . insulin detemir (LEVEMIR) 100 UNIT/ML injection Inject 40 Units into the skin 2 (two) times daily. His Dr. is about to try a new insulin soon.      [name] lisinopril (PRINIVIL,ZESTRIL) 20 MG tablet Take 1 tablet (20 mg total) by mouth daily.  90 tablet  3  . LIVALO 1 MG TABS Take 1 mg by mouth daily.      . metFORMIN (GLUCOPHAGE) 1000 MG tablet Take 1,000 mg by mouth 2 (two) times daily with a meal.      . sotalol (BETAPACE) 120 MG tablet Take 120 mg by mouth 2 (two) times daily.      . traMADol-acetaminophen (ULTRACET) 37.5-325 MG per tablet Take 1 tablet by mouth as needed.       . warfarin (COUMADIN) 5 MG tablet Take 1 tablet (5 mg total) by mouth daily. Per INR.  120 tablet  3   No current facility-administered medications for this visit.    ROS- all systems are reviewed and negative except as per HPI  Physical Exam: Filed Vitals:   12/03/13 0825  BP: 165/87  Pulse: 68  Height: 5\' 9"  (1.753 m)  Weight: 280 lb 12.8 oz (127.37 kg)    GEN- The patient is well appearing, alert and oriented x 3 today.   Head- normocephalic, atraumatic Eyes-  Sclera clear, conjunctiva pink Ears- hearing intact Oropharynx- clear Neck- supple,  Lungs- Clear to ausculation bilaterally, normal work of breathing Chest- pacemaker pocket is well healed Heart- Regular rate and rhythm, no murmurs, rubs or gallops, PMI not laterally displaced GI- soft, NT, ND, + BS Extremities- no clubbing, cyanosis, or edema MS- no significant deformity or atrophy Skin- no rash or lesion Psych- euthymic mood, full affect Neuro- strength and sensation are intact  Pacemaker interrogation- reviewed in detail today,  See PACEART report ekg today reveals atrial pacing with PR 184 msec, LBBB (QRS 146 msec), QTc 486  Assessment and Plan:  1. Sick sinus syndrome Normal pacemaker function See Pace Art report No changes today  2. Chronic systolic dysfunction (EF  40-45%) Stable Will increase lisinopril to 20mg  daily  3. CAD No ischemic symptoms As he has been stable for several years and is on coumadin for afib, I will stop ASA at this time  4. afib Well controlled with sotalol Qt is stable chads2vasc score is at least 5.  He should continue life long anticoagulation  5. OSA Compliance  with CPAP encouraged  6. Obesity Weight reduction is encouraged  7. HTN Above goal Increase lisinopril from 10 to 20mg  daily bmet is advised--> he wishes to have this performed by primary care when he sees them in the next few weeks  8. HL No recent lipids He will have fasting lipids and LFTs by primary care.  I have asked that these be forwarded to me   Return to see Truitt Merle in 3 months I will see in 6 months

## 2013-12-24 ENCOUNTER — Ambulatory Visit (INDEPENDENT_AMBULATORY_CARE_PROVIDER_SITE_OTHER): Payer: BC Managed Care – PPO | Admitting: Pharmacist Clinician (PhC)/ Clinical Pharmacy Specialist

## 2013-12-24 VITALS — BP 136/72 | HR 64

## 2013-12-24 DIAGNOSIS — I4891 Unspecified atrial fibrillation: Secondary | ICD-10-CM

## 2013-12-24 DIAGNOSIS — Z7901 Long term (current) use of anticoagulants: Secondary | ICD-10-CM

## 2013-12-24 LAB — POCT INR: INR: 2.8

## 2014-02-06 ENCOUNTER — Ambulatory Visit (INDEPENDENT_AMBULATORY_CARE_PROVIDER_SITE_OTHER): Payer: BC Managed Care – PPO | Admitting: Pharmacist Clinician (PhC)/ Clinical Pharmacy Specialist

## 2014-02-06 VITALS — BP 128/70 | HR 64

## 2014-02-06 DIAGNOSIS — Z7901 Long term (current) use of anticoagulants: Secondary | ICD-10-CM

## 2014-02-06 DIAGNOSIS — I4891 Unspecified atrial fibrillation: Secondary | ICD-10-CM

## 2014-02-06 LAB — POCT INR: INR: 2.8

## 2014-03-04 ENCOUNTER — Ambulatory Visit (INDEPENDENT_AMBULATORY_CARE_PROVIDER_SITE_OTHER): Payer: BC Managed Care – PPO | Admitting: Nurse Practitioner

## 2014-03-04 ENCOUNTER — Encounter: Payer: Self-pay | Admitting: Nurse Practitioner

## 2014-03-04 VITALS — BP 150/90 | HR 69 | Ht 69.0 in | Wt 278.8 lb

## 2014-03-04 DIAGNOSIS — I255 Ischemic cardiomyopathy: Secondary | ICD-10-CM

## 2014-03-04 DIAGNOSIS — I2589 Other forms of chronic ischemic heart disease: Secondary | ICD-10-CM

## 2014-03-04 DIAGNOSIS — I10 Essential (primary) hypertension: Secondary | ICD-10-CM

## 2014-03-04 NOTE — Progress Notes (Addendum)
Charles [name] Date of Birth: Mar 23, 1946 Medical Record #[id number]  History of Present Illness: Charles [name] is seen back today for a 3 month check. Seen for Dr. [name]. Charles [name] has a history of bradycardia, s/p PPM by Dr. Leonia Reeves, HTN, CAD with past PCI of the LAD with LCX/OM bifurcation stenting and proximal RCA stenting, COPD, OA, obesity, OSA, type 2 DM, HLD with past statin intolerance, PAF - maintaining sinus on Sotalol, chronic anticoagulation, LBBB and chronic systolic HF. Last echo from August of 2014 with EF 40 to 45%.   Seen here back in January - ACE was increased in efforts to maximize his heart failure regimen and his BP was up.   Comes back today. Here alone. Doing ok. No chest pain. Not short of breath. BP 128/78 at his PCP last week. Rare postural dizziness. Feels ok on his medicines. Rare palpitation. No problems with his coumadin. Charles [name] is limited by his arthritis but tries to exercise as much as possible. Typically wears support stockings with good success.   Current Outpatient Prescriptions  Medication Sig Dispense Refill  . CIALIS 20 MG tablet Take 20 mg by mouth daily as needed.       . collagenase (SANTYL) ointment Apply 1 application topically as needed.      . ezetimibe (ZETIA) 10 MG tablet Take 1 tablet (10 mg total) by mouth daily.  90 tablet  3  . glimepiride (AMARYL) 4 MG tablet Take 4 mg by mouth 2 (two) times daily.      . hydrochlorothiazide (HYDRODIURIL) 25 MG tablet Take 1 tablet (25 mg total) by mouth daily.  90 tablet  1  . lisinopril (PRINIVIL,ZESTRIL) 20 MG tablet Take 1 tablet (20 mg total) by mouth daily.  90 tablet  3  . LIVALO 1 MG TABS Take 1 mg by mouth daily.      . metFORMIN (GLUCOPHAGE) 1000 MG tablet Take 1,000 mg by mouth 2 (two) times daily with a meal.      . NOVOLIN N RELION 100 UNIT/ML injection Inject 42 Units into the skin daily.       . sotalol (BETAPACE) 120 MG tablet Take 120 mg by mouth 2 (two) times daily.      [name] terbinafine (LAMISIL) 250  MG tablet Take 250 mg by mouth daily.       . traMADol-acetaminophen (ULTRACET) 37.5-325 MG per tablet Take 1 tablet by mouth as needed.       . warfarin (COUMADIN) 5 MG tablet Take 1 tablet (5 mg total) by mouth daily. Per INR.  120 tablet  3   No current facility-administered medications for this visit.    Allergies  Allergen Reactions  . Codeine   . Oxytetracycline   . Statins Other (See Comments)    Muscle ache    Past Medical History  Diagnosis Date  . Hypertension   . Coronary artery disease     last cath 2008,  remote PCI of the LAD with Lcx/OM bifurcation stenting and proximal RCA stenting  . Obstructive chronic bronchitis   . Other dysphagia   . Osteoarthritis, knee   . Dyslipidemia   . Exogenous obesity     severe  . Hx of colonic polyps   . Nephrolithiasis   . Long term (current) use of anticoagulants   . OSA (obstructive sleep apnea)   . Chronic venous insufficiency     with prior venous stasis ulcers  . Diabetes mellitus, type II     AODM  .  Statin intolerance     Hx of. Now tolerating Zetia & Livalo well.   . Weakness     In bilateral legs. Duplex performed 04/02/13   . Fatigue   . DOE (dyspnea on exertion)     TEE 07/10/13 - LV EF 40-45%  . History of nuclear stress test 01/02/13    OVERALL IMPRESSION: Low risk stress nuclear study. Fixed inferolateral defect, likely scar. No reversible ischemia. LV wall motion: Inferolateral hypokinesis. LVEF 47%. (Hilty)  . MI (myocardial infarction) 1995  . 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 Jude Zephyr XL DR 5826, dual chamber, rate responsive. No arrhythmias recorded and Charles [name] has an excellent threshold.  . SSS (sick sinus syndrome)   . PAF (paroxysmal atrial fibrillation)   . LBBB (left bundle branch block)     Charles [name] has developed a native LBBB which was seen on his last visit of March 2014 (From OV note 07/03/13)   . Regional wall motion abnormality of heart     By TEE 07/10/13  . Chronic systolic dysfunction of  left ventricle     EF40-45%  . Left atrial dilatation     moderate by echo    Past Surgical History  Procedure Laterality Date  . Pacemaker insertion  08/28/08    St. Jude Zephyr XL DR 5826, dual chamber, rate responsive. No arrhythmias recorded and Charles [name] has an excellent threshold.  . Coronary angioplasty with stent placement  1998 & 2008    Last cath in 2008 and Charles [name] has had remote interventions with LAD stenting, Cx/OM bifurcation stenting, and proximal right coronary stent    History  Smoking status  . Former Smoker  . Types: Cigarettes  . Quit date: 11/23/1987  Smokeless tobacco  . Not on file    History  Alcohol Use No    Family History  Problem Relation Age of Onset  . CAD    . Hypertension    . Obesity      Review of Systems: The review of systems is per the HPI.  All other systems were reviewed and are negative.  Physical Exam: BP 150/90  Pulse 69  Ht 5\' 9"  (1.753 m)  Wt 278 lb 12.8 oz (126.463 kg)  BMI 41.15 kg/m2  SpO2 96% BP is 130/80 by me.  Patient is very pleasant and in no acute distress. Charles [name] is obese. Skin is warm and dry. Color is normal.  HEENT is unremarkable. Normocephalic/atraumatic. PERRL. Sclera are nonicteric. Neck is supple. No masses. No JVD. Lungs are clear. Cardiac exam shows a regular rate and rhythm. Heart tones distant. Abdomen is soft. Extremities are full without edema. Gait and ROM are intact. No gross neurologic deficits noted.  Wt Readings from Last 3 Encounters:  03/04/14 278 lb 12.8 oz (126.463 kg)  12/03/13 280 lb 12.8 oz (127.37 kg)  01/02/13 280 lb (127.007 kg)     LABORATORY DATA: Lab Results  Component Value Date   WBC 8.1 07/03/2013   HGB 13.5 07/03/2013   HCT 39.7 07/03/2013   PLT 248 07/03/2013   GLUCOSE 102* 07/03/2013   CHOL  Value: 117        ATP III CLASSIFICATION:  <200     mg/dL   Desirable  200-239  mg/dL   Borderline High  >=240    mg/dL   High        12/14/2008   TRIG 97 12/14/2008   HDL 39* 12/14/2008   LDLCALC   Value: 59  Total Cholesterol/HDL:CHD Risk Coronary Heart Disease Risk Table                     Men   Women  1/2 Average Risk   3.4   3.3  Average Risk       5.0   4.4  2 X Average Risk   9.6   7.1  3 X Average Risk  23.4   11.0        Use the calculated Patient Ratio above and the CHD Risk Table to determine the patient's CHD Risk.        ATP III CLASSIFICATION (LDL):  <100     mg/dL   Optimal  100-129  mg/dL   Near or Above                    Optimal  130-159  mg/dL   Borderline  160-189  mg/dL   High  >190     mg/dL   Very High 12/14/2008   ALT 22 07/03/2013   AST 15 07/03/2013   NA 139 07/03/2013   K 4.7 07/03/2013   CL 102 07/03/2013   CREATININE 1.31 07/03/2013   BUN 22 07/03/2013   CO2 30 07/03/2013   TSH 1.704 Test methodology is 3rd generation TSH 12/14/2008   INR 2.8 02/06/2014   HGBA1C  Value: 6.1 (NOTE)   The ADA recommends the following therapeutic goal for glycemic   control related to Hgb A1C measurement:   Goal of Therapy:   < 7.0% Hgb A1C   Reference: American Diabetes Association: Clinical Practice   Recommendations 2008, Diabetes Care,  2008, 31:(Suppl 1). 10/05/2008   Echo Study Conclusions from August 2014  - Left ventricle: The cavity size was moderately dilated. There was moderate concentric hypertrophy. Systolic function was mildly to moderately reduced. The estimated ejection fraction was in the range of 40% to 45%. Moderate hypokinesis of the anteroseptal myocardium. Moderate hypokinesis of the lateral myocardium. Doppler parameters are consistent with abnormal left ventricular relaxation (grade 1 diastolic dysfunction). - Mitral valve: Mild regurgitation. Valve area by pressure half-time: 2.12cm^2. - Left atrium: The atrium was moderately dilated. - Right atrium: The atrium was mildly dilated. - Atrial septum: No defect or patent foramen ovale was identified. - Pulmonary arteries: PA peak pressure: 3mm Hg (S).  Lab Results  Component Value Date   INR 2.8  02/06/2014   INR 2.8 12/24/2013   INR 2.4 11/26/2013    Assessment / Plan: 1. Ischemic CM - doing well clinically.   2. HTN - recheck of his BP is good by me. No change in current regimen.  2. HLD  4. DM  5. PAF - on sotalol  6. Underlying PPM  See Dr. [name] back in July as planned. No change in current regimen. Encouraged CV risk factor modification.   Patient is agreeable to this plan and will call if any problems develop in the interim.   Burtis Junes, RN, [facility] [address] [place], [place]  [address] [phone]   Addendum: After his visit was complete - Charles [name] forgot to tell me that Charles [name] has had more issues with his short term memory. Charles [name] says Charles [name] is forgetting things that Charles [name] should not be forgetting. Wondering if it is his statin. We will stop the Livelo for a month. I have offered neuro referral. Charles [name] will wait a month and see if this improves.

## 2014-03-04 NOTE — Patient Instructions (Signed)
Stay on your current medicines  See Dr. [name] in July  Stay active  Call the [facility] office at [phone] if you have any questions, problems or concerns.   

## 2014-03-20 ENCOUNTER — Ambulatory Visit (INDEPENDENT_AMBULATORY_CARE_PROVIDER_SITE_OTHER): Payer: BC Managed Care – PPO | Admitting: Pharmacist Clinician (PhC)/ Clinical Pharmacy Specialist

## 2014-03-20 VITALS — BP 152/86 | HR 80

## 2014-03-20 DIAGNOSIS — I4891 Unspecified atrial fibrillation: Secondary | ICD-10-CM

## 2014-03-20 DIAGNOSIS — Z7901 Long term (current) use of anticoagulants: Secondary | ICD-10-CM

## 2014-03-20 LAB — POCT INR: INR: 1.4

## 2014-04-01 ENCOUNTER — Other Ambulatory Visit: Payer: Self-pay

## 2014-04-01 MED ORDER — LISINOPRIL 20 MG PO TABS: 20.0000 mg | ORAL_TABLET | Freq: Every day | ORAL | Status: DC

## 2014-04-10 ENCOUNTER — Ambulatory Visit (INDEPENDENT_AMBULATORY_CARE_PROVIDER_SITE_OTHER): Payer: BC Managed Care – PPO | Admitting: Pharmacist Clinician (PhC)/ Clinical Pharmacy Specialist

## 2014-04-10 DIAGNOSIS — I4891 Unspecified atrial fibrillation: Secondary | ICD-10-CM

## 2014-04-10 DIAGNOSIS — Z7901 Long term (current) use of anticoagulants: Secondary | ICD-10-CM

## 2014-04-10 LAB — POCT INR: INR: 1.8

## 2014-04-24 ENCOUNTER — Other Ambulatory Visit: Payer: Self-pay

## 2014-04-24 MED ORDER — HYDROCHLOROTHIAZIDE 25 MG PO TABS: 25.0000 mg | ORAL_TABLET | Freq: Every day | ORAL | Status: DC

## 2014-05-01 ENCOUNTER — Ambulatory Visit: Payer: BC Managed Care – PPO | Admitting: Pharmacist Clinician (PhC)/ Clinical Pharmacy Specialist

## 2014-05-06 ENCOUNTER — Ambulatory Visit (INDEPENDENT_AMBULATORY_CARE_PROVIDER_SITE_OTHER): Payer: BC Managed Care – PPO | Admitting: Pharmacist Clinician (PhC)/ Clinical Pharmacy Specialist

## 2014-05-06 DIAGNOSIS — Z7901 Long term (current) use of anticoagulants: Secondary | ICD-10-CM

## 2014-05-06 DIAGNOSIS — I4891 Unspecified atrial fibrillation: Secondary | ICD-10-CM

## 2014-05-06 LAB — POCT INR: INR: 1.5

## 2014-05-13 ENCOUNTER — Telehealth: Payer: Self-pay | Admitting: Pharmacist Clinician (PhC)/ Clinical Pharmacy Specialist

## 2014-05-13 MED ORDER — WARFARIN SODIUM 5 MG PO TABS: ORAL_TABLET | ORAL | Status: DC

## 2014-05-13 NOTE — Telephone Encounter (Signed)
Pt need a new prescription for his [employer]. Please call [facility] [phone]

## 2014-05-15 ENCOUNTER — Encounter: Payer: Self-pay | Admitting: Internal Medicine

## 2014-05-15 ENCOUNTER — Encounter: Payer: BC Managed Care – PPO | Admitting: Internal Medicine

## 2014-05-17 ENCOUNTER — Telehealth: Payer: Self-pay | Admitting: Internal Medicine

## 2014-05-17 MED ORDER — WARFARIN SODIUM 5 MG PO TABS: ORAL_TABLET | ORAL | Status: DC

## 2014-05-17 NOTE — Telephone Encounter (Signed)
Need a new prescription for his Warfarin 5 mg #90.Please call to [place] time he will be using them.

## 2014-05-20 ENCOUNTER — Ambulatory Visit (INDEPENDENT_AMBULATORY_CARE_PROVIDER_SITE_OTHER): Payer: BC Managed Care – PPO | Admitting: Pharmacist Clinician (PhC)/ Clinical Pharmacy Specialist

## 2014-05-20 DIAGNOSIS — I4891 Unspecified atrial fibrillation: Secondary | ICD-10-CM

## 2014-05-20 DIAGNOSIS — Z7901 Long term (current) use of anticoagulants: Secondary | ICD-10-CM

## 2014-05-20 LAB — POCT INR: INR: 2

## 2014-06-05 ENCOUNTER — Encounter: Payer: BC Managed Care – PPO | Admitting: Internal Medicine

## 2014-06-12 ENCOUNTER — Ambulatory Visit (INDEPENDENT_AMBULATORY_CARE_PROVIDER_SITE_OTHER): Payer: BC Managed Care – PPO | Admitting: Internal Medicine

## 2014-06-12 ENCOUNTER — Encounter: Payer: Self-pay | Admitting: Internal Medicine

## 2014-06-12 VITALS — BP 167/90 | HR 69 | Ht 69.0 in | Wt 274.1 lb

## 2014-06-12 DIAGNOSIS — I10 Essential (primary) hypertension: Secondary | ICD-10-CM

## 2014-06-12 DIAGNOSIS — Z950 Presence of cardiac pacemaker: Secondary | ICD-10-CM

## 2014-06-12 DIAGNOSIS — I259 Chronic ischemic heart disease, unspecified: Secondary | ICD-10-CM

## 2014-06-12 DIAGNOSIS — G4733 Obstructive sleep apnea (adult) (pediatric): Secondary | ICD-10-CM

## 2014-06-12 DIAGNOSIS — I4891 Unspecified atrial fibrillation: Secondary | ICD-10-CM

## 2014-06-12 DIAGNOSIS — E669 Obesity, unspecified: Secondary | ICD-10-CM

## 2014-06-12 DIAGNOSIS — Z7901 Long term (current) use of anticoagulants: Secondary | ICD-10-CM

## 2014-06-12 LAB — MDC_IDC_ENUM_SESS_TYPE_INCLINIC
Battery Impedance: 1500 Ohm
Battery Voltage: 2.81 V
Brady Statistic AP VP Percent: 1 % — CL
Brady Statistic AP VS Percent: 95 %
Brady Statistic AS VS Percent: 3.3 %
Date Time Interrogation Session: 20150722133840
Implantable Pulse Generator Model: 5826
Lead Channel Impedance Value: 496 Ohm
Lead Channel Impedance Value: 599 Ohm
Lead Channel Pacing Threshold Pulse Width: 0.4 ms
Lead Channel Pacing Threshold Pulse Width: 0.4 ms
Lead Channel Sensing Intrinsic Amplitude: 12 mV
Lead Channel Setting Pacing Amplitude: 2 V
Lead Channel Setting Pacing Amplitude: 2.25 V
Lead Channel Setting Sensing Sensitivity: 2 mV
MDC IDC MSMT LEADCHNL RA PACING THRESHOLD AMPLITUDE: 1 V
MDC IDC MSMT LEADCHNL RA SENSING INTR AMPL: 1.2 mV
MDC IDC MSMT LEADCHNL RV PACING THRESHOLD AMPLITUDE: 0.75 V
MDC IDC PG SERIAL: 1266370
MDC IDC SET LEADCHNL RV PACING PULSEWIDTH: 0.4 ms
MDC IDC STAT BRADY AS VP PERCENT: 1 % — AB

## 2014-06-12 LAB — BASIC METABOLIC PANEL
BUN: 26 mg/dL — AB (ref 6–23)
CALCIUM: 9.3 mg/dL (ref 8.4–10.5)
CO2: 25 mEq/L (ref 19–32)
Chloride: 105 mEq/L (ref 96–112)
Creatinine, Ser: 1.2 mg/dL (ref 0.4–1.5)
GFR: 66.65 mL/min (ref >60.00)
Glucose, Bld: 141 mg/dL — ABNORMAL HIGH (ref 70–99)
POTASSIUM: 4.4 meq/L (ref 3.5–5.1)
Sodium: 137 mEq/L (ref 135–145)

## 2014-06-12 LAB — MAGNESIUM: MAGNESIUM: 1.7 mg/dL (ref 1.5–2.5)

## 2014-06-12 MED ORDER — LISINOPRIL 40 MG PO TABS: 40.0000 mg | ORAL_TABLET | Freq: Every day | ORAL | Status: DC

## 2014-06-12 MED ORDER — SOTALOL HCL 120 MG PO TABS: 120.0000 mg | ORAL_TABLET | Freq: Every day | ORAL | Status: DC

## 2014-06-12 NOTE — Progress Notes (Signed)
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 male with a h/o bradycardia sp PPM (MDT) by Dr Charles [name] who presents today to follow-up in the Electrophysiology device clinic.   He has done reasonably well since his last visit.  He has been taking sotalol only once per day but is maintaining sinus with this.  Today, he  denies symptoms of palpitations, chest pain, shortness of breath, orthopnea, PND, lower extremity edema, dizziness, presyncope, syncope, or neurologic sequela.  The patientis tolerating medications without difficulties and is otherwise without complaint today.   Past Medical History  Diagnosis Date  . Hypertension   . Coronary artery disease     last cath 2008,  remote PCI of the LAD with Lcx/OM bifurcation stenting and proximal RCA stenting  . Obstructive chronic bronchitis   . Other dysphagia   . Osteoarthritis, knee   . Dyslipidemia   . Exogenous obesity     severe  . Hx of colonic polyps   . Nephrolithiasis   . Long term (current) use of anticoagulants   . OSA (obstructive sleep apnea)   . Chronic venous insufficiency     with prior venous stasis ulcers  . Diabetes mellitus, type II     AODM  . Statin intolerance     Hx of. Now tolerating Zetia & Livalo well.   . Weakness     In bilateral legs. Duplex performed 04/02/13   . Fatigue   . DOE (dyspnea on exertion)     TEE 07/10/13 - LV EF 40-45%  . History of nuclear stress test 01/02/13    OVERALL IMPRESSION: Low risk stress nuclear study. Fixed inferolateral defect, likely scar. No reversible ischemia. LV wall motion: Inferolateral hypokinesis. LVEF 47%. (Hilty)  . MI (myocardial infarction) 1995  . 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 Jude Zephyr XL DR 5826, dual chamber, rate responsive. No arrhythmias recorded and he has an excellent threshold.  . SSS (sick sinus syndrome)   . PAF (paroxysmal atrial fibrillation)   . LBBB (left bundle branch block)       He has developed a native LBBB which was seen on his last visit of March 2014 (From OV note 07/03/13)   . Regional wall motion abnormality of heart     By TEE 07/10/13  . Chronic systolic dysfunction of left ventricle     EF40-45%  . Left atrial dilatation     moderate by echo   Past Surgical History  Procedure Laterality Date  . Pacemaker insertion  08/28/08    St. Jude Zephyr XL DR 5826, dual chamber, rate responsive. No arrhythmias recorded and he has an excellent threshold.  . Coronary angioplasty with stent placement  1998 & 2008    Last cath in 2008 and he has had remote interventions with LAD stenting, Cx/OM bifurcation stenting, and proximal right coronary stent    History   Social History  . Marital Status: Married    Spouse Name: N/A    Number of Children: N/A  . Years of Education: N/A   Occupational History  . Not on file.   Social History Main Topics  . Smoking status: Former Smoker    Types: Cigarettes    Quit date: 11/23/1987  . Smokeless tobacco: Not on file  . Alcohol Use: No  . Drug Use: No  . Sexual Activity: Not on file   Other Topics Concern  . Not on  file   Social History Narrative   Married   Works at [employer] and also as [employer] at the [employer] History  Problem Relation Age of Onset  . CAD    . Hypertension    . Obesity      Allergies  Allergen Reactions  . Statins Other (See Comments)    Mental changes, muscle aches  . Codeine   . Oxytetracycline     Current Outpatient Prescriptions  Medication Sig Dispense Refill  . CIALIS 20 MG tablet Take 20 mg by mouth daily as needed.       . collagenase (SANTYL) ointment Apply 1 application topically as needed.      [name] glimepiride (AMARYL) 4 MG tablet Take 4 mg by mouth 2 (two) times daily.      . hydrochlorothiazide (HYDRODIURIL) 25 MG tablet Take 1 tablet (25 mg total) by mouth daily.  90 tablet  1  . insulin regular (NOVOLIN R,HUMULIN R) 100 units/mL injection  Inject 22 Units into the skin 3 (three) times daily before meals.      [name] lisinopril (PRINIVIL,ZESTRIL) 40 MG tablet Take 1 tablet (40 mg total) by mouth daily.  90 tablet  3  . metFORMIN (GLUCOPHAGE) 1000 MG tablet Take 500 mg by mouth 2 (two) times daily with a meal.       . NOVOLIN N RELION 100 UNIT/ML injection Inject 30 Units into the skin at bedtime. Use as directecd      . sotalol (BETAPACE) 120 MG tablet Take 1 tablet (120 mg total) by mouth daily. Pt states he has only been taking this once a day. He realized this morning that his Rx bottle says to take twice a day (06/12/14)      . terbinafine (LAMISIL) 250 MG tablet Take 250 mg by mouth daily.       . traMADol-acetaminophen (ULTRACET) 37.5-325 MG per tablet Take 1 tablet by mouth as needed.       . warfarin (COUMADIN) 5 MG tablet Take 1.5 -2 tablets by mouth daily as directed by coumadin clinic  150 tablet  1   No current facility-administered medications for this visit.    ROS- all systems are reviewed and negative except as per HPI  Physical Exam: Filed Vitals:   06/12/14 1132  BP: 167/90  Pulse: 69  Height: 5\' 9"  (1.753 m)  Weight: 274 lb 1.9 oz (124.34 kg)    GEN- The patient is well appearing, alert and oriented x 3 today.   Head- normocephalic, atraumatic Eyes-  Sclera clear, conjunctiva pink Ears- hearing intact Oropharynx- clear Neck- supple,  Lungs- Clear to ausculation bilaterally, normal work of breathing Chest- pacemaker pocket is well healed Heart- Regular rate and rhythm, no murmurs, rubs or gallops, PMI not laterally displaced GI- soft, NT, ND, + BS Extremities- no clubbing, cyanosis, or edema Neuro- strength and sensation are intact  Pacemaker interrogation- reviewed in detail today,  See PACEART report ekg today reveals atrial pacing with PR 208 msec, LBBB (QRS 146 msec), QTc 464 Previous echo is reviewed  Assessment and Plan:  1. Sick sinus syndrome Normal pacemaker function See Pace Art  report No changes today  2. Chronic systolic dysfunction (EF 32-95%) Stable symptoms Will increase lisinopril to 40mg  daily Repeat echo Bmet/ mg today  3. CAD No ischemic symptoms  4. afib Well controlled with sotalol daily Qt is stable chads2vasc score is at least 5.  He should continue life long anticoagulation  bmet and mg on sotalol today  5. OSA Compliance with CPAP encouraged  6. Obesity Weight reduction is encouraged I have recommended referral to nutrition (given dx of diabetes he would qualify)--> he declines  7. HTN Above goal Increase lisinopril from 20 to 40mg  daily  8. HL Not tolerating statins Consider referral to [facility] for lipid management in the future   Return to see Truitt Merle in 3 months I will see in 6 months

## 2014-06-12 NOTE — Patient Instructions (Addendum)
Your physician recommends that you schedule a follow-up appointment in: 3 months with Truitt Merle, NP and 6 months with Dr [name]  Your physician has requested that you have an echocardiogram. Echocardiography is a painless test that uses sound waves to create images of your heart. It provides your doctor with information about the size and shape of your heart and how well your heart's chambers and valves are working. This procedure takes approximately one hour. There are no restrictions for this procedure.  Your physician has recommended you make the following change in your medication:  1) Increase Lisinopril to 40mg  daily  Your physician recommends that you return for lab work today: BMP/MAG 3

## 2014-06-17 ENCOUNTER — Encounter: Payer: BC Managed Care – PPO | Admitting: Internal Medicine

## 2014-06-17 ENCOUNTER — Ambulatory Visit (INDEPENDENT_AMBULATORY_CARE_PROVIDER_SITE_OTHER): Payer: BC Managed Care – PPO | Admitting: Pharmacist Clinician (PhC)/ Clinical Pharmacy Specialist

## 2014-06-17 VITALS — BP 140/80 | HR 72

## 2014-06-17 DIAGNOSIS — I4891 Unspecified atrial fibrillation: Secondary | ICD-10-CM

## 2014-06-17 DIAGNOSIS — Z7901 Long term (current) use of anticoagulants: Secondary | ICD-10-CM

## 2014-06-17 LAB — POCT INR: INR: 2.4

## 2014-06-20 ENCOUNTER — Ambulatory Visit (HOSPITAL_COMMUNITY): Payer: BC Managed Care – PPO | Attending: Cardiovascular Disease | Admitting: Radiology

## 2014-06-20 DIAGNOSIS — I4891 Unspecified atrial fibrillation: Secondary | ICD-10-CM | POA: Insufficient documentation

## 2014-06-20 NOTE — Progress Notes (Signed)
Echocardiogram performed.  

## 2014-07-15 ENCOUNTER — Ambulatory Visit (INDEPENDENT_AMBULATORY_CARE_PROVIDER_SITE_OTHER): Payer: BC Managed Care – PPO | Admitting: Pharmacist Clinician (PhC)/ Clinical Pharmacy Specialist

## 2014-07-15 DIAGNOSIS — I4891 Unspecified atrial fibrillation: Secondary | ICD-10-CM

## 2014-07-15 DIAGNOSIS — Z7901 Long term (current) use of anticoagulants: Secondary | ICD-10-CM

## 2014-07-15 LAB — POCT INR: INR: 1.8

## 2014-08-05 ENCOUNTER — Ambulatory Visit: Payer: BC Managed Care – PPO | Admitting: Pharmacist Clinician (PhC)/ Clinical Pharmacy Specialist

## 2014-08-06 ENCOUNTER — Other Ambulatory Visit: Payer: Self-pay

## 2014-08-06 MED ORDER — SOTALOL HCL 120 MG PO TABS: 120.0000 mg | ORAL_TABLET | Freq: Two times a day (BID) | ORAL | Status: DC

## 2014-08-07 ENCOUNTER — Ambulatory Visit (INDEPENDENT_AMBULATORY_CARE_PROVIDER_SITE_OTHER): Payer: BC Managed Care – PPO | Admitting: Pharmacist Clinician (PhC)/ Clinical Pharmacy Specialist

## 2014-08-07 DIAGNOSIS — I4891 Unspecified atrial fibrillation: Secondary | ICD-10-CM

## 2014-08-07 DIAGNOSIS — Z7901 Long term (current) use of anticoagulants: Secondary | ICD-10-CM

## 2014-08-07 LAB — POCT INR: INR: 1.8

## 2014-08-28 ENCOUNTER — Telehealth: Payer: Self-pay | Admitting: Internal Medicine

## 2014-08-28 ENCOUNTER — Other Ambulatory Visit: Payer: Self-pay | Admitting: Orthopedic Surgery

## 2014-08-28 DIAGNOSIS — M25511 Pain in right shoulder: Secondary | ICD-10-CM

## 2014-08-28 NOTE — Telephone Encounter (Signed)
If medically necessary, hold coumadin for 5 days prior to the procedure and resume immediately afterwards

## 2014-08-28 NOTE — Telephone Encounter (Signed)
°  Patient needs clearance to come off warfarin 5 days prior for Arthrogram. Please fax approval to [phone] Attention Angelita Ingles so they can proceed to schedule patient.

## 2014-08-30 ENCOUNTER — Ambulatory Visit (INDEPENDENT_AMBULATORY_CARE_PROVIDER_SITE_OTHER): Payer: BC Managed Care – PPO | Admitting: Pharmacist Clinician (PhC)/ Clinical Pharmacy Specialist

## 2014-08-30 DIAGNOSIS — Z7901 Long term (current) use of anticoagulants: Secondary | ICD-10-CM

## 2014-08-30 DIAGNOSIS — I4891 Unspecified atrial fibrillation: Secondary | ICD-10-CM

## 2014-08-30 LAB — POCT INR: INR: 2.3

## 2014-09-05 ENCOUNTER — Ambulatory Visit
Admission: RE | Admit: 2014-09-05 | Discharge: 2014-09-05 | Disposition: A | Discharge status: Home / Self Care | Payer: BC Managed Care – PPO | Source: Ambulatory Visit | Attending: Orthopedic Surgery | Admitting: Orthopedic Surgery

## 2014-09-05 DIAGNOSIS — M25511 Pain in right shoulder: Secondary | ICD-10-CM

## 2014-09-05 MED ORDER — IOHEXOL 180 MG/ML  SOLN
13.0000 mL | Freq: Once | INTRAMUSCULAR | Status: AC | PRN
  Administered 2014-09-05: 13 mL via INTRAVENOUS

## 2014-09-25 ENCOUNTER — Telehealth: Payer: Self-pay | Admitting: Pharmacist Clinician (PhC)/ Clinical Pharmacy Specialist

## 2014-09-27 ENCOUNTER — Ambulatory Visit: Payer: BC Managed Care – PPO | Admitting: Pharmacist Clinician (PhC)/ Clinical Pharmacy Specialist

## 2014-09-30 ENCOUNTER — Ambulatory Visit (INDEPENDENT_AMBULATORY_CARE_PROVIDER_SITE_OTHER): Payer: BC Managed Care – PPO | Admitting: Pharmacist Clinician (PhC)/ Clinical Pharmacy Specialist

## 2014-09-30 DIAGNOSIS — Z7901 Long term (current) use of anticoagulants: Secondary | ICD-10-CM

## 2014-09-30 DIAGNOSIS — I4891 Unspecified atrial fibrillation: Secondary | ICD-10-CM

## 2014-09-30 LAB — POCT INR: INR: 1.6

## 2014-09-30 NOTE — Telephone Encounter (Signed)
Closed encounter °

## 2014-10-14 ENCOUNTER — Ambulatory Visit (INDEPENDENT_AMBULATORY_CARE_PROVIDER_SITE_OTHER): Payer: BC Managed Care – PPO | Admitting: Pharmacist Clinician (PhC)/ Clinical Pharmacy Specialist

## 2014-10-14 DIAGNOSIS — Z7901 Long term (current) use of anticoagulants: Secondary | ICD-10-CM

## 2014-10-14 DIAGNOSIS — I4891 Unspecified atrial fibrillation: Secondary | ICD-10-CM

## 2014-10-14 LAB — POCT INR: INR: 2.5

## 2014-10-22 ENCOUNTER — Telehealth: Payer: Self-pay | Admitting: Pharmacist Clinician (PhC)/ Clinical Pharmacy Specialist

## 2014-10-22 MED ORDER — WARFARIN SODIUM 5 MG PO TABS: ORAL_TABLET | ORAL | Status: DC

## 2014-10-22 NOTE — Telephone Encounter (Signed)
-----   Message from Flossie Dibble, [place] sent at 10/21/2014  1:16 PM EST ----- Regarding: Refill Request Pt call needs Rx refill to [facility]. Warfarin 5mg s

## 2014-10-31 ENCOUNTER — Other Ambulatory Visit: Payer: Self-pay | Admitting: Internal Medicine

## 2014-11-11 ENCOUNTER — Ambulatory Visit: Payer: BC Managed Care – PPO | Admitting: Pharmacist Clinician (PhC)/ Clinical Pharmacy Specialist

## 2014-11-22 DIAGNOSIS — B9681 Helicobacter pylori [H. pylori] as the cause of diseases classified elsewhere: Secondary | ICD-10-CM

## 2014-11-22 DIAGNOSIS — K2970 Gastritis, unspecified, without bleeding: Secondary | ICD-10-CM

## 2014-11-22 HISTORY — DX: Gastritis, unspecified, without bleeding: K29.70

## 2014-11-22 HISTORY — DX: Helicobacter pylori (H. pylori) as the cause of diseases classified elsewhere: B96.81

## 2014-11-27 ENCOUNTER — Other Ambulatory Visit: Payer: Self-pay

## 2014-11-27 ENCOUNTER — Other Ambulatory Visit: Payer: Self-pay | Admitting: Internal Medicine

## 2014-11-27 MED ORDER — SOTALOL HCL 120 MG PO TABS: 120.0000 mg | ORAL_TABLET | Freq: Two times a day (BID) | ORAL | Status: DC

## 2014-12-16 ENCOUNTER — Encounter: Payer: Self-pay | Admitting: Internal Medicine

## 2014-12-16 ENCOUNTER — Ambulatory Visit (INDEPENDENT_AMBULATORY_CARE_PROVIDER_SITE_OTHER): Payer: BLUE CROSS/BLUE SHIELD | Admitting: Internal Medicine

## 2014-12-16 VITALS — BP 172/88 | HR 70 | Ht 69.0 in | Wt 276.8 lb

## 2014-12-16 DIAGNOSIS — E785 Hyperlipidemia, unspecified: Secondary | ICD-10-CM

## 2014-12-16 DIAGNOSIS — G4733 Obstructive sleep apnea (adult) (pediatric): Secondary | ICD-10-CM

## 2014-12-16 DIAGNOSIS — E669 Obesity, unspecified: Secondary | ICD-10-CM

## 2014-12-16 DIAGNOSIS — I10 Essential (primary) hypertension: Secondary | ICD-10-CM

## 2014-12-16 DIAGNOSIS — I259 Chronic ischemic heart disease, unspecified: Secondary | ICD-10-CM

## 2014-12-16 DIAGNOSIS — I4891 Unspecified atrial fibrillation: Secondary | ICD-10-CM

## 2014-12-16 LAB — MDC_IDC_ENUM_SESS_TYPE_INCLINIC
Battery Voltage: 2.82 V
Implantable Pulse Generator Serial Number: 1266370
Lead Channel Impedance Value: 437 Ohm
Lead Channel Impedance Value: 534 Ohm
Lead Channel Pacing Threshold Amplitude: 1 V
Lead Channel Pacing Threshold Pulse Width: 0.4 ms
Lead Channel Sensing Intrinsic Amplitude: 1.4 mV
Lead Channel Sensing Intrinsic Amplitude: 12 mV
Lead Channel Setting Pacing Amplitude: 2.25 V
Lead Channel Setting Pacing Pulse Width: 0.4 ms
Lead Channel Setting Sensing Sensitivity: 2 mV
MDC IDC MSMT BATTERY IMPEDANCE: 1900 Ohm
MDC IDC MSMT LEADCHNL RA PACING THRESHOLD PULSEWIDTH: 0.4 ms
MDC IDC MSMT LEADCHNL RV PACING THRESHOLD AMPLITUDE: 0.75 V
MDC IDC SESS DTM: 20160125102627
MDC IDC SET LEADCHNL RA PACING AMPLITUDE: 2 V
MDC IDC STAT BRADY RA PERCENT PACED: 96 %
MDC IDC STAT BRADY RV PERCENT PACED: 1 % — AB

## 2014-12-16 NOTE — Patient Instructions (Addendum)
Your physician wants you to follow-up in: 6 months in the device clinic and 12 months with Dr [name] Dennis Bast will receive a reminder letter in the mail two months in advance. If you don't receive a letter, please call our office to schedule the follow-up appointment.   Your physician recommends that you return for lab---gave order to patient to have dine fasting and fax to [place]: BMP/MAG/Lipid

## 2014-12-16 NOTE — Progress Notes (Signed)
Electrophysiology Office Note   Date:  12/16/2014   ID:  Charles, [name] [birth year], [name]  PCP:  Charles Sake, MD  Primary Electrophysiologist:  Charles Grayer, MD    Chief Complaint  Patient presents with  . blood pressure elevation     History of Present Illness: Charles [name] is a 69 y.o. male who presents today for electrophysiology evaluation.   He has done reasonably well since I saw him last.  He has had some orthopeodic issues, primarily with back pain.  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  His afib is well controlled.  BP is elevated.  CHF is stable.  He continues to wear his CPAP.   Today, he denies symptoms of palpitations, chest pain, shortness of breath, orthopnea, PND, lower extremity edema, claudication, dizziness, presyncope, syncope, bleeding, or neurologic sequela. The patient is tolerating medications without difficulties and is otherwise without complaint today.    Past Medical History  Diagnosis Date  . Hypertension   . Coronary artery disease     last cath 2008,  remote PCI of the LAD with Lcx/OM bifurcation stenting and proximal RCA stenting  . Obstructive chronic bronchitis   . Other dysphagia   . Osteoarthritis, knee   . Dyslipidemia   . Exogenous obesity     severe  . Hx of colonic polyps   . Nephrolithiasis   . Long term (current) use of anticoagulants   . OSA (obstructive sleep apnea)   . Chronic venous insufficiency     with prior venous stasis ulcers  . Diabetes mellitus, type II     AODM  . Statin intolerance     Hx of. Now tolerating Zetia & Livalo well.   . Weakness     In bilateral legs. Duplex performed 04/02/13   . Fatigue   . DOE (dyspnea on exertion)     TEE 07/10/13 - LV EF 40-45%  . History of nuclear stress test 01/02/13    OVERALL IMPRESSION: Low risk stress nuclear study. Fixed inferolateral defect, likely scar. No reversible ischemia. LV wall motion: Inferolateral  hypokinesis. LVEF 47%. ([place]ilty)  . MI (myocardial infarction) 1995  . 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 Jude Zephyr XL DR 5826, dual chamber, rate responsive. No arrhythmias recorded and he has an excellent threshold.  . SSS (sick sinus syndrome)   . PAF (paroxysmal atrial fibrillation)   . LBBB (left bundle branch block)     He has developed a native LBBB which was seen on his last visit of March 2014 (From OV note 07/03/13)   . Regional wall motion abnormality of heart     By TEE 07/10/13  . Chronic systolic dysfunction of left ventricle     EF40-45%  . Left atrial dilatation     moderate by echo   Past Surgical History  Procedure Laterality Date  . Pacemaker insertion  08/28/08    St. Jude Zephyr XL DR 5826, dual chamber, rate responsive. No arrhythmias recorded and he has an excellent threshold.  . Coronary angioplasty with stent placement  1998 & 2008    Last cath in 2008 and he has had remote interventions with LAD stenting, Cx/OM bifurcation stenting, and proximal right coronary stent     Current Outpatient Prescriptions  Medication Sig Dispense Refill  . collagenase (SANTYL) ointment Apply 1 application topically daily as needed (skin).     [name] glimepiride (  AMARYL) 4 MG tablet Take 4 mg by mouth 2 (two) times daily.    . hydrochlorothiazide (HYDRODIURIL) 25 MG tablet TAKE ONE TABLET BY MOUTH ONCE DAILY 90 tablet 1  . HYDROcodone-acetaminophen (NORCO/VICODIN) 5-325 MG per tablet Take 1 tablet by mouth 2 (two) times daily. Severe pain    . insulin regular (NOVOLIN R,HUMULIN R) 100 units/mL injection Inject 25 Units into the skin 3 (three) times daily before meals.     [name] lisinopril (PRINIVIL,ZESTRIL) 40 MG tablet Take 40 mg by mouth daily.     . metFORMIN (GLUCOPHAGE) 500 MG tablet Take 2,000 mg by mouth daily. With meal    . NOVOLIN N RELION 100 UNIT/ML injection Inject 32 Units into the skin 2 (two) times daily before a meal. Use as directecd    . sotalol (BETAPACE) 120 MG  tablet Take 1 tablet (120 mg total) by mouth 2 (two) times daily. 60 tablet 3  . terbinafine (LAMISIL) 250 MG tablet Take 250 mg by mouth daily.     . traMADol-acetaminophen (ULTRACET) 37.5-325 MG per tablet Take 1 tablet by mouth 2 (two) times daily.     [name] warfarin (COUMADIN) 5 MG tablet Take 1.5 -2 tablets by mouth daily as directed by coumadin clinic (Patient taking differently: Take as directed by the coumadin clinic) 150 tablet 1   No current facility-administered medications for this visit.    Allergies:   Statins; Codeine; and Oxytetracycline   Social History:  The patient  reports that he quit smoking about 27 years ago. His smoking use included Cigarettes. He does not have any smokeless tobacco history on file. He reports that he does not drink alcohol or use illicit drugs.   Family History:  The patient's family history includes CAD in an other family member; Hypertension in an other family member; Obesity in an other family member.    ROS:  Please see the history of present illness.  All other systems are reviewed and negative.    PHYSICAL EXAM: VS:  BP 172/88 mmHg  Pulse 70  Ht 5\' 9"  (1.753 m)  Wt 276 lb 12.8 oz (125.556 kg)  BMI 40.86 kg/m2 , BMI Body mass index is 40.86 kg/(m^2). GEN: overweight, well developed, in no acute distress HEENT: normal Neck: no JVD, carotid bruits, or masses Cardiac: RRR; no murmurs, rubs, or gallops,no edema  Respiratory:  clear to auscultation bilaterally, normal work of breathing GI: soft, nontender, nondistended, + BS MS: no deformity or atrophy Skin: warm and dry,  device pocket is well healed Neuro:  Strength and sensation are intact Psych: euthymic mood, full affect  EKG:  EKG is ordered today. The ekg ordered today shows atrial pacing, AV 174, incomplete LBBB, Qtc 460   Device interrogation is reviewed today in detail.  See PaceArt for details.   Recent Labs: 06/12/2014: BUN 26*; Creatinine 1.2; Magnesium 1.7; Potassium 4.4;  Sodium 137    Lipid Panel     Component Value Date/Time   CHOL  12/14/2008 0845    117        ATP III CLASSIFICATION:  <200     mg/dL   Desirable  200-239  mg/dL   Borderline High  >=240    mg/dL   High          TRIG 97 12/14/2008 0845   HDL 39* 12/14/2008 0845   CHOLHDL 3.0 12/14/2008 0845   VLDL 19 12/14/2008 0845   LDLCALC  12/14/2008 0845    59  Total Cholesterol/HDL:CHD Risk Coronary Heart Disease Risk Table                     Men   Women  1/2 Average Risk   3.4   3.3  Average Risk       5.0   4.4  2 X Average Risk   9.6   7.1  3 X Average Risk  23.4   11.0        Use the calculated Patient Ratio above and the CHD Risk Table to determine the patient's CHD Risk.        ATP III CLASSIFICATION (LDL):  <100     mg/dL   Optimal  100-129  mg/dL   Near or Above                    Optimal  130-159  mg/dL   Borderline  160-189  mg/dL   High  >190     mg/dL   Very High     Wt Readings from Last 3 Encounters:  12/16/14 276 lb 12.8 oz (125.556 kg)  06/12/14 274 lb 1.9 oz (124.34 kg)  03/04/14 278 lb 12.8 oz (126.463 kg)     ASSESSMENT AND PLAN:  1. Sick sinus syndrome Normal pacemaker function See Pace Art report No changes today  2. Chronic systolic dysfunction (EF 01-75%)--> returned to normal by echo 7/15 (reviewed with patient today) Stable symptoms   3. CAD No ischemic symptoms  4. afib Well controlled with sotalol daily Qt is stable chads2vasc score is at least 5. He should continue life long anticoagulation.  He is on coumadin. bmet and mg on sotalol today  5. OSA Compliance with CPAP encouraged (he is compliant)  6. Obesity Weight reduction is encouraged  7. HTN Above goal.  Repeat by MD is 140/81 I have encouraged lifestyle modification No changes today bmet  8. HL Not tolerating statins Repeat fasting lipids Consider referral to lipid clinic for lipid management in the future, though I have reviewed prior lipids from 5/15  and primiarly his issue is with elevated TGs which would improve with lifestyle change.   Return to see Truitt Merle in 6 months I will see in 12 months    Current medicines are reviewed at length with the patient today.   The patient does not have concerns regarding his medicines.  The following changes were made today:  none    Signed, Charles Grayer, MD  12/16/2014 10:17 AM     [facility] [address] [place] [address] [phone] (office) [phone] (fax)

## 2015-01-20 ENCOUNTER — Ambulatory Visit (INDEPENDENT_AMBULATORY_CARE_PROVIDER_SITE_OTHER): Payer: BLUE CROSS/BLUE SHIELD | Admitting: Pharmacist Clinician (PhC)/ Clinical Pharmacy Specialist

## 2015-01-20 DIAGNOSIS — I4891 Unspecified atrial fibrillation: Secondary | ICD-10-CM

## 2015-01-20 DIAGNOSIS — Z7901 Long term (current) use of anticoagulants: Secondary | ICD-10-CM

## 2015-01-20 LAB — POCT INR: INR: 2.8

## 2015-03-03 ENCOUNTER — Ambulatory Visit: Payer: BLUE CROSS/BLUE SHIELD | Admitting: Pharmacist Clinician (PhC)/ Clinical Pharmacy Specialist

## 2015-03-10 ENCOUNTER — Ambulatory Visit (INDEPENDENT_AMBULATORY_CARE_PROVIDER_SITE_OTHER): Payer: BLUE CROSS/BLUE SHIELD | Admitting: Pharmacist Clinician (PhC)/ Clinical Pharmacy Specialist

## 2015-03-10 DIAGNOSIS — Z7901 Long term (current) use of anticoagulants: Secondary | ICD-10-CM

## 2015-03-10 DIAGNOSIS — I4891 Unspecified atrial fibrillation: Secondary | ICD-10-CM

## 2015-03-10 LAB — POCT INR: INR: 2.2

## 2015-04-08 ENCOUNTER — Encounter: Payer: Self-pay | Admitting: Internal Medicine

## 2015-04-14 ENCOUNTER — Other Ambulatory Visit: Payer: Self-pay | Admitting: Internal Medicine

## 2015-04-22 ENCOUNTER — Other Ambulatory Visit: Payer: Self-pay | Admitting: Pharmacist Clinician (PhC)/ Clinical Pharmacy Specialist

## 2015-04-28 ENCOUNTER — Other Ambulatory Visit: Payer: Self-pay | Admitting: Internal Medicine

## 2015-05-05 ENCOUNTER — Ambulatory Visit (INDEPENDENT_AMBULATORY_CARE_PROVIDER_SITE_OTHER): Payer: BLUE CROSS/BLUE SHIELD | Admitting: Pharmacist Clinician (PhC)/ Clinical Pharmacy Specialist

## 2015-05-05 DIAGNOSIS — Z7901 Long term (current) use of anticoagulants: Secondary | ICD-10-CM | POA: Diagnosis not present

## 2015-05-05 DIAGNOSIS — I4891 Unspecified atrial fibrillation: Secondary | ICD-10-CM | POA: Diagnosis not present

## 2015-05-05 LAB — POCT INR: INR: 3.1

## 2015-05-15 ENCOUNTER — Telehealth: Payer: Self-pay | Admitting: Internal Medicine

## 2015-05-15 NOTE — Telephone Encounter (Signed)
Received records from [facility] forwarded to Dr. [name] 05/15/15 fbg

## 2015-05-16 ENCOUNTER — Encounter: Payer: Self-pay | Admitting: Internal Medicine

## 2015-06-11 ENCOUNTER — Encounter: Payer: Self-pay | Admitting: Cardiovascular Disease

## 2015-06-16 ENCOUNTER — Ambulatory Visit (INDEPENDENT_AMBULATORY_CARE_PROVIDER_SITE_OTHER): Payer: BLUE CROSS/BLUE SHIELD | Admitting: Pharmacist

## 2015-06-16 ENCOUNTER — Ambulatory Visit (INDEPENDENT_AMBULATORY_CARE_PROVIDER_SITE_OTHER): Payer: BLUE CROSS/BLUE SHIELD | Admitting: *Deleted

## 2015-06-16 DIAGNOSIS — I4891 Unspecified atrial fibrillation: Secondary | ICD-10-CM | POA: Diagnosis not present

## 2015-06-16 DIAGNOSIS — Z7901 Long term (current) use of anticoagulants: Secondary | ICD-10-CM

## 2015-06-16 DIAGNOSIS — I480 Paroxysmal atrial fibrillation: Secondary | ICD-10-CM | POA: Diagnosis not present

## 2015-06-16 LAB — CUP PACEART INCLINIC DEVICE CHECK
Battery Impedance: 2000 Ohm
Brady Statistic RA Percent Paced: 97 %
Brady Statistic RV Percent Paced: 1 %
Lead Channel Impedance Value: 418 Ohm
Lead Channel Pacing Threshold Amplitude: 0.75 V
Lead Channel Pacing Threshold Amplitude: 1 V
Lead Channel Pacing Threshold Pulse Width: 0.4 ms
Lead Channel Sensing Intrinsic Amplitude: 12 mV
Lead Channel Setting Pacing Amplitude: 2 V
Lead Channel Setting Sensing Sensitivity: 2 mV
MDC IDC MSMT BATTERY VOLTAGE: 2.78 V
MDC IDC MSMT LEADCHNL RA SENSING INTR AMPL: 1.2 mV
MDC IDC MSMT LEADCHNL RV IMPEDANCE VALUE: 526 Ohm
MDC IDC MSMT LEADCHNL RV PACING THRESHOLD PULSEWIDTH: 0.4 ms
MDC IDC PG SERIAL: 1266370
MDC IDC SESS DTM: 20160725101320
MDC IDC SET LEADCHNL RV PACING AMPLITUDE: 2.25 V
MDC IDC SET LEADCHNL RV PACING PULSEWIDTH: 0.4 ms
Pulse Gen Model: 5826

## 2015-06-16 LAB — POCT INR: INR: 3.6

## 2015-06-16 NOTE — Progress Notes (Signed)
Pacemaker check in clinic. Normal device function. Thresholds, sensing, impedances consistent with previous measurements. Device programmed to maximize longevity. (1) mode switch episode x 6 sec @ A. rate of 175bpm---no EGM. No high ventricular rates noted. Device programmed at appropriate safety margins. Histogram distribution appropriate for patient activity level. Device programmed to optimize intrinsic conduction. Estimated longevity 3.75-5 years. Patient to follow up with JA in 11-2015.

## 2015-06-23 ENCOUNTER — Other Ambulatory Visit: Payer: Self-pay | Admitting: Internal Medicine

## 2015-06-24 ENCOUNTER — Other Ambulatory Visit: Payer: Self-pay

## 2015-06-24 MED ORDER — LISINOPRIL 40 MG PO TABS: 40.0000 mg | ORAL_TABLET | Freq: Every day | ORAL | Status: DC

## 2015-06-25 ENCOUNTER — Encounter: Payer: Self-pay | Admitting: Cardiovascular Disease

## 2015-07-07 ENCOUNTER — Other Ambulatory Visit: Payer: Self-pay | Admitting: Cardiovascular Disease

## 2015-07-07 ENCOUNTER — Ambulatory Visit (INDEPENDENT_AMBULATORY_CARE_PROVIDER_SITE_OTHER): Payer: BLUE CROSS/BLUE SHIELD | Admitting: Pharmacist Clinician (PhC)/ Clinical Pharmacy Specialist

## 2015-07-07 DIAGNOSIS — Z7901 Long term (current) use of anticoagulants: Secondary | ICD-10-CM

## 2015-07-07 DIAGNOSIS — I4891 Unspecified atrial fibrillation: Secondary | ICD-10-CM

## 2015-07-07 LAB — POCT INR
INR: 2.8
INR: 2.8

## 2015-07-15 ENCOUNTER — Other Ambulatory Visit: Payer: Self-pay | Admitting: Internal Medicine

## 2015-08-04 ENCOUNTER — Ambulatory Visit (INDEPENDENT_AMBULATORY_CARE_PROVIDER_SITE_OTHER): Payer: BLUE CROSS/BLUE SHIELD | Admitting: Pharmacist Clinician (PhC)/ Clinical Pharmacy Specialist

## 2015-08-04 DIAGNOSIS — I4891 Unspecified atrial fibrillation: Secondary | ICD-10-CM

## 2015-08-04 DIAGNOSIS — Z7901 Long term (current) use of anticoagulants: Secondary | ICD-10-CM

## 2015-08-04 LAB — POCT INR: INR: 3.2

## 2015-09-01 ENCOUNTER — Ambulatory Visit (INDEPENDENT_AMBULATORY_CARE_PROVIDER_SITE_OTHER): Payer: BLUE CROSS/BLUE SHIELD | Admitting: Pharmacist Clinician (PhC)/ Clinical Pharmacy Specialist

## 2015-09-01 DIAGNOSIS — Z7901 Long term (current) use of anticoagulants: Secondary | ICD-10-CM

## 2015-09-01 DIAGNOSIS — I4891 Unspecified atrial fibrillation: Secondary | ICD-10-CM | POA: Diagnosis not present

## 2015-09-01 DIAGNOSIS — I480 Paroxysmal atrial fibrillation: Secondary | ICD-10-CM | POA: Diagnosis not present

## 2015-09-01 LAB — POCT INR: INR: 3.4

## 2015-09-23 HISTORY — PX: ESOPHAGOGASTRODUODENOSCOPY: SHX1529

## 2015-09-29 ENCOUNTER — Ambulatory Visit: Payer: BLUE CROSS/BLUE SHIELD | Admitting: Pharmacist Clinician (PhC)/ Clinical Pharmacy Specialist

## 2015-09-30 ENCOUNTER — Encounter: Payer: Self-pay | Admitting: Internal Medicine

## 2015-10-08 ENCOUNTER — Telehealth: Payer: Self-pay | Admitting: Pharmacist Clinician (PhC)/ Clinical Pharmacy Specialist

## 2015-10-08 NOTE — Telephone Encounter (Signed)
Close encounter 

## 2015-10-16 ENCOUNTER — Encounter (HOSPITAL_COMMUNITY): Payer: Self-pay | Admitting: Emergency Medicine

## 2015-10-16 ENCOUNTER — Emergency Department (HOSPITAL_COMMUNITY)
Admission: EM | Admit: 2015-10-16 | Discharge: 2015-10-16 | Disposition: A | Discharge status: Home / Self Care | Payer: BLUE CROSS/BLUE SHIELD | Attending: Emergency Medicine | Admitting: Emergency Medicine

## 2015-10-16 DIAGNOSIS — I10 Essential (primary) hypertension: Secondary | ICD-10-CM | POA: Insufficient documentation

## 2015-10-16 DIAGNOSIS — E119 Type 2 diabetes mellitus without complications: Secondary | ICD-10-CM | POA: Diagnosis not present

## 2015-10-16 DIAGNOSIS — Z950 Presence of cardiac pacemaker: Secondary | ICD-10-CM | POA: Insufficient documentation

## 2015-10-16 DIAGNOSIS — Z794 Long term (current) use of insulin: Secondary | ICD-10-CM | POA: Insufficient documentation

## 2015-10-16 DIAGNOSIS — Z86010 Personal history of colonic polyps: Secondary | ICD-10-CM | POA: Diagnosis not present

## 2015-10-16 DIAGNOSIS — E785 Hyperlipidemia, unspecified: Secondary | ICD-10-CM | POA: Insufficient documentation

## 2015-10-16 DIAGNOSIS — Z79899 Other long term (current) drug therapy: Secondary | ICD-10-CM | POA: Diagnosis not present

## 2015-10-16 DIAGNOSIS — R1084 Generalized abdominal pain: Secondary | ICD-10-CM | POA: Diagnosis present

## 2015-10-16 DIAGNOSIS — I5022 Chronic systolic (congestive) heart failure: Secondary | ICD-10-CM | POA: Insufficient documentation

## 2015-10-16 DIAGNOSIS — I2510 Atherosclerotic heart disease of native coronary artery without angina pectoris: Secondary | ICD-10-CM | POA: Insufficient documentation

## 2015-10-16 DIAGNOSIS — Z87891 Personal history of nicotine dependence: Secondary | ICD-10-CM | POA: Diagnosis not present

## 2015-10-16 DIAGNOSIS — I252 Old myocardial infarction: Secondary | ICD-10-CM | POA: Diagnosis not present

## 2015-10-16 DIAGNOSIS — Z87442 Personal history of urinary calculi: Secondary | ICD-10-CM | POA: Insufficient documentation

## 2015-10-16 LAB — LIPASE, BLOOD: Lipase: 31 U/L (ref 11–51)

## 2015-10-16 MED ORDER — SODIUM CHLORIDE 0.9 % IV BOLUS (SEPSIS)
500.0000 mL | Freq: Once | INTRAVENOUS | Status: AC
  Administered 2015-10-16: 500 mL via INTRAVENOUS

## 2015-10-16 MED ORDER — ONDANSETRON HCL 4 MG/2ML IJ SOLN
4.0000 mg | Freq: Once | INTRAMUSCULAR | Status: AC
  Administered 2015-10-16: 4 mg via INTRAVENOUS
  Filled 2015-10-16: qty 2

## 2015-10-16 MED ORDER — ONDANSETRON HCL 4 MG PO TABS: 4.0000 mg | ORAL_TABLET | Freq: Four times a day (QID) | ORAL | Status: AC

## 2015-10-16 NOTE — ED Notes (Signed)
Pt to ED via EMS from [facility] with c/o abd pain..  Pt st's pain started this am approx 1 hour after eating breakfast.  Also st's his abd started to swell.  Pt c/o nausea without vomiting.  Last BM this am (small).  Pt st's he had same type episode approx 6 months ago but subsided on it's on.

## 2015-10-16 NOTE — Discharge Instructions (Signed)
Call tomorrow at 8 AM to set up an appointment with GI  Abdominal Pain, Adult Many things can cause abdominal pain. Usually, abdominal pain is not caused by a disease and will improve without treatment. It can often be observed and treated at home. Your health care provider will do a physical exam and possibly order blood tests and X-rays to help determine the seriousness of your pain. However, in many cases, more time must pass before a clear cause of the pain can be found. Before that point, your health care provider may not know if you need more testing or further treatment. HOME CARE INSTRUCTIONS Monitor your abdominal pain for any changes. The following actions may help to alleviate any discomfort you are experiencing:  Only take over-the-counter or prescription medicines as directed by your health care provider.  Do not take laxatives unless directed to do so by your health care provider.  Try a clear liquid diet (broth, tea, or water) as directed by your health care provider. Slowly move to a bland diet as tolerated. SEEK MEDICAL CARE IF:  You have unexplained abdominal pain.  You have abdominal pain associated with nausea or diarrhea.  You have pain when you urinate or have a bowel movement.  You experience abdominal pain that wakes you in the night.  You have abdominal pain that is worsened or improved by eating food.  You have abdominal pain that is worsened with eating fatty foods.  You have a fever. SEEK IMMEDIATE MEDICAL CARE IF:  Your pain does not go away within 2 hours.  You keep throwing up (vomiting).  Your pain is felt only in portions of the abdomen, such as the right side or the left lower portion of the abdomen.  You pass bloody or black tarry stools. MAKE SURE YOU:  Understand these instructions.  Will watch your condition.  Will get help right away if you are not doing well or get worse.   This information is not intended to replace advice given to  you by your health care provider. Make sure you discuss any questions you have with your health care provider.   Document Released: 08/18/2005 Document Revised: 07/30/2015 Document Reviewed: 07/18/2013 Elsevier Interactive Patient Education [employer].

## 2015-10-16 NOTE — ED Provider Notes (Signed)
CSN: [id number]     Arrival date & time 10/16/15  1827 History   First MD Initiated Contact with Patient 10/16/15 1944     Chief Complaint  Patient presents with  . Abdominal Pain     (Consider location/radiation/quality/duration/timing/severity/associated sxs/prior Treatment) HPI  Patient is a 69 year old male with past medical history significant for hypertension, CAD, arthritis, OSA, diabetes, who presents to the emergency department for diffuse abdominal pain. Over the past 6 months has had intermittent "abdominal swelling". States that approximately 2 hours after eating he had diffuse, constant, dull abdominal pain with abdominal swelling that started today. Pain radiates to his back. Nothing has improved or worsen the pain. Took gas relief without improvement of symptoms. Pain has completely resolved, however he continues to complain of abdominal swelling. Associated with nausea, no vomiting. Denies anorexia, diarrhea, melena, dysuria, difficulty urinating, hematuria. Seen at urgent care clinic, CT w con A/P, CTA aortic dissection and basic lab work obtained, he was told that there was no findings but that he should come to the emergency department for an ultrasound of his gallbladder. Reports history of kidney stones. Denies fevers or chills. Denies unexplained weight loss, night sweats, fevers. Last colonoscopy 10-12 years ago, showed no acute findings. Past Medical History  Diagnosis Date  . Hypertension   . Coronary artery disease     last cath 2008,  remote PCI of the LAD with Lcx/OM bifurcation stenting and proximal RCA stenting  . Obstructive chronic bronchitis ([place])   . Other dysphagia   . Osteoarthritis, knee   . Dyslipidemia   . Exogenous obesity     severe  . Hx of colonic polyps   . Nephrolithiasis   . Long term (current) use of anticoagulants   . OSA (obstructive sleep apnea)   . Chronic venous insufficiency     with prior venous stasis ulcers  . Diabetes mellitus,  type II ([place])     AODM  . Statin intolerance     Hx of. Now tolerating Zetia & Livalo well.   . Weakness     In bilateral legs. Duplex performed 04/02/13   . Fatigue   . DOE (dyspnea on exertion)     TEE 07/10/13 - LV EF 40-45%  . History of nuclear stress test 01/02/13    OVERALL IMPRESSION: Low risk stress nuclear study. Fixed inferolateral defect, likely scar. No reversible ischemia. LV wall motion: Inferolateral hypokinesis. LVEF 47%. ([place])  . MI (myocardial infarction) ([place]) 1995  . 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 Jude Zephyr XL DR 5826, dual chamber, rate responsive. No arrhythmias recorded and he has an excellent threshold.  . SSS (sick sinus syndrome) ([place])   . PAF (paroxysmal atrial fibrillation) ([place])   . LBBB (left bundle branch block)     He has developed a native LBBB which was seen on his last visit of March 2014 (From OV note 07/03/13)   . Regional wall motion abnormality of heart     By TEE 07/10/13  . Chronic systolic dysfunction of left ventricle     EF40-45%  . Left atrial dilatation     moderate by echo   Past Surgical History  Procedure Laterality Date  . Pacemaker insertion  08/28/08    St. Jude Zephyr XL DR 5826, dual chamber, rate responsive. No arrhythmias recorded and he has an excellent threshold.  . Coronary angioplasty with stent placement  1998 & 2008    Last cath in 2008 and he has  had remote interventions with LAD stenting, Cx/OM bifurcation stenting, and proximal right coronary stent  . Appendectomy  1962   Family History  Problem Relation Age of Onset  . CAD    . Hypertension    . Obesity    . Heart disease Mother   . Arthritis Sister   . Epilepsy Brother   . Stroke Maternal Grandmother   . Lung cancer Maternal Grandfather   . Stroke Paternal Grandfather    Social History  Substance Use Topics  . Smoking status: Former Smoker    Types: Cigarettes    Quit date: 11/23/1987  . Smokeless tobacco: None  . Alcohol Use: No     Review of Systems  Constitutional: Negative for fever, appetite change and unexpected weight change.  HENT: Negative for congestion and trouble swallowing.   Eyes: Negative for visual disturbance.  Respiratory: Negative for chest tightness and shortness of breath.   Cardiovascular: Negative for chest pain and palpitations.  Gastrointestinal: Positive for nausea, abdominal pain and abdominal distention. Negative for vomiting, diarrhea, constipation, blood in stool and rectal pain.  Genitourinary: Positive for flank pain. Negative for dysuria, hematuria, decreased urine volume, penile swelling and penile pain.  Musculoskeletal: Negative for back pain and gait problem.  Skin: Negative for rash.  Neurological: Negative for dizziness, seizures, weakness, light-headedness and headaches.  Psychiatric/Behavioral: Negative for behavioral problems.      Allergies  Statins; Chocolate; Codeine; and Oxytetracycline  Home Medications   Prior to Admission medications   Medication Sig Start Date End Date Taking? Authorizing Provider  albuterol (PROVENTIL HFA;VENTOLIN HFA) 108 (90 BASE) MCG/ACT inhaler Inhale 1 puff into the lungs every 6 (six) hours as needed for wheezing or shortness of breath.   Yes Historical Provider, MD  glimepiride (AMARYL) 4 MG tablet Take 4 mg by mouth 2 (two) times daily.   Yes Historical Provider, MD  glyBURIDE micronized (GLYNASE) 3 MG tablet Take 3 mg by mouth 2 (two) times daily.   Yes Historical Provider, MD  hydrochlorothiazide (HYDRODIURIL) 25 MG tablet Take 1 tablet (25 mg total) by mouth daily. 07/15/15  Yes Thompson Grayer, MD  insulin regular (NOVOLIN R,HUMULIN R) 100 units/mL injection Inject 38 Units into the skin 2 (two) times daily before a meal.    Yes Historical Provider, MD  lisinopril (PRINIVIL,ZESTRIL) 40 MG tablet Take 1 tablet (40 mg total) by mouth daily. 06/24/15  Yes Thompson Grayer, MD  metFORMIN (GLUCOPHAGE) 500 MG tablet Take 500 mg by mouth 2 (two)  times daily with a meal. With meal   Yes Historical Provider, MD  NOVOLIN N RELION 100 UNIT/ML injection Inject 38 Units into the skin 2 (two) times daily before a meal. Use as directecd 02/26/14  Yes Historical Provider, MD  sotalol (BETAPACE) 120 MG tablet TAKE ONE TABLET BY MOUTH TWICE DAILY 04/15/15  Yes Thompson Grayer, MD  traMADol-acetaminophen (ULTRACET) 37.5-325 MG per tablet Take 1 tablet by mouth every 6 (six) hours as needed for moderate pain (pain).  11/05/13  Yes Historical Provider, MD  TURMERIC CURCUMIN PO Take 1 capsule by mouth daily.   Yes Historical Provider, MD  warfarin (COUMADIN) 5 MG tablet TAKE 1&1/2 TO 2 TABLETS BY MOUTH ONCE DAILY AS DIRECTED BY COUMADIN CLINIC Patient taking differently: TAKE 1&1/2 TO 2 TABLETS BY MOUTH ONCE DAILY AS DIRECTED BY COUMADIN CLINIC. 5mg  all days except Wed. 7.5mg  on Weds 07/07/15  Yes Mihai Croitoru, MD  ondansetron (ZOFRAN) 4 MG tablet Take 1 tablet (4 mg total) by mouth  every 6 (six) hours. 10/16/15 10/19/15  Nathaniel Man, MD   BP 128/86 mmHg  Pulse 71  Temp(Src) 99.2 F (37.3 C) (Oral)  Resp 16  Ht 5\' 9"  (1.753 m)  Wt 127.007 kg  BMI 41.33 kg/m2  SpO2 99% Physical Exam  Constitutional: He is oriented to person, place, and time. He appears well-developed and well-nourished. No distress.  HENT:  Head: Atraumatic.  Mouth/Throat: Oropharynx is clear and moist.  Eyes: Conjunctivae and EOM are normal. Pupils are equal, round, and reactive to light.  Neck: Normal range of motion. Neck supple. No JVD present. No tracheal deviation present.  Cardiovascular: Normal rate, regular rhythm, normal heart sounds and intact distal pulses.   Pulmonary/Chest: Effort normal and breath sounds normal. No respiratory distress. He exhibits no tenderness.  Abdominal: Soft. Bowel sounds are normal. He exhibits distension. There is no hepatosplenomegaly. There is no tenderness. There is no rigidity, no rebound, no guarding, no CVA tenderness and negative  Murphy's sign. No hernia.  Musculoskeletal: Normal range of motion.  Neurological: He is alert and oriented to person, place, and time.  Skin: Skin is warm.  Psychiatric: He has a normal mood and affect.    ED Course  Procedures (including critical care time) Labs Review Labs Reviewed  LIPASE, BLOOD    Imaging Review No results found. I have personally reviewed and evaluated these images and lab results as part of my medical decision-making.   EKG Interpretation None       EMERGENCY DEPARTMENT US GALLBLADDER INTERPRETATION "Study: Limited Ultrasound of the gallbladder and common bile duct."  INDICATIONS: Abdominal pain Indication: Multiple views of the gallbladder and common bile duct are obtained with a  Multi-frequency probe."  PERFORMED BY:  Myself  IMAGES ARCHIVED?: Yes  FINDINGS: Gallstones absent, Gallbladder wall normal in thickness, Sonographic Murphy's sign absent and Common bile duct normal in size  LIMITATIONS: Body Habitus and Bowel Gas  INTERPRETATION: Normal  COMMENT:     MDM   Final diagnoses:  Generalized abdominal pain   Patient is a 69 year old male with past medical history significant for hypertension, CAD, arthritis, OSA, diabetes, who presents to the emergency department for diffuse abdominal pain and abdominal distention. On arrival no acute distress, not ill appearing. Pain has completely resolved. Afebrile, hemodynamically stable. Abdominal distention, no fluid wave, no tenderness to palpation, no CVA tenderness.  Basic labs, CT A/P w contrast, CTA aorta obtained at OSH prior to arrival. Outside imaging and labs reviewed. Labs remarkable for lactic acid 2.4. No leukocytosis. No significant electrolyte abnormalities. No elevated LFTs. Imaging showed no acute findings, doubt emergent intra-abdominal etiology of his abdominal pain given negative imaging. Unlikely SBO, mesenteric ischemia, appendicitis, diverticulitis, volvulus.  Patient given  Zofran, IVF.   Bedside gallbladder ultrasound obtained, showed no acute findings. Lipase added to evaluate for pancreatitis, lipase 31.   On reevaluation the patient continues to have no abdominal pain, nausea resolved, benign abdominal exam. No history of cirrhosis, CHF, or CKD. No signs of ascites or anasarca.   Patient has an outpatient appointment scheduled with GI. Discussed with patient that he should call tomorrow morning to see if his appointment can be moved up. Discussed strict return precautions for worsening of symptoms or return of symptoms to the emergency department. Discussed follow up with PCP in the next 1-2 days. Patient expressed understanding, no questions or concerns at time of discharge.    Nathaniel Man, MD 10/17/15 [id number]  Virgel Manifold, MD 10/31/15 [phone]

## 2015-10-20 ENCOUNTER — Telehealth: Payer: Self-pay | Admitting: Internal Medicine

## 2015-10-20 NOTE — Telephone Encounter (Signed)
Patient will come in tomorrow and see Amy Esterwood PA at 8:30

## 2015-10-21 ENCOUNTER — Telehealth: Payer: Self-pay | Admitting: Physician Assistant

## 2015-10-21 ENCOUNTER — Other Ambulatory Visit (INDEPENDENT_AMBULATORY_CARE_PROVIDER_SITE_OTHER): Payer: BLUE CROSS/BLUE SHIELD

## 2015-10-21 ENCOUNTER — Ambulatory Visit (INDEPENDENT_AMBULATORY_CARE_PROVIDER_SITE_OTHER)
Admission: RE | Admit: 2015-10-21 | Discharge: 2015-10-21 | Disposition: A | Discharge status: Home / Self Care | Payer: BLUE CROSS/BLUE SHIELD | Source: Ambulatory Visit | Attending: Physician Assistant | Admitting: Physician Assistant

## 2015-10-21 ENCOUNTER — Ambulatory Visit (INDEPENDENT_AMBULATORY_CARE_PROVIDER_SITE_OTHER): Payer: BLUE CROSS/BLUE SHIELD | Admitting: Physician Assistant

## 2015-10-21 ENCOUNTER — Other Ambulatory Visit: Payer: Self-pay | Admitting: *Deleted

## 2015-10-21 ENCOUNTER — Encounter: Payer: Self-pay | Admitting: Physician Assistant

## 2015-10-21 VITALS — BP 130/82 | HR 70 | Ht 69.0 in | Wt 273.5 lb

## 2015-10-21 DIAGNOSIS — R110 Nausea: Secondary | ICD-10-CM

## 2015-10-21 DIAGNOSIS — R7989 Other specified abnormal findings of blood chemistry: Secondary | ICD-10-CM | POA: Diagnosis not present

## 2015-10-21 DIAGNOSIS — R112 Nausea with vomiting, unspecified: Secondary | ICD-10-CM

## 2015-10-21 DIAGNOSIS — R109 Unspecified abdominal pain: Secondary | ICD-10-CM

## 2015-10-21 DIAGNOSIS — R100 Acute abdomen: Secondary | ICD-10-CM

## 2015-10-21 DIAGNOSIS — R945 Abnormal results of liver function studies: Secondary | ICD-10-CM

## 2015-10-21 LAB — COMPREHENSIVE METABOLIC PANEL
ALBUMIN: 3.4 g/dL — AB (ref 3.5–5.2)
ALK PHOS: 164 U/L — AB (ref 39–117)
ALT: 94 U/L — ABNORMAL HIGH (ref 0–53)
AST: 53 U/L — ABNORMAL HIGH (ref 0–37)
BUN: 17 mg/dL (ref 6–23)
CALCIUM: 9.1 mg/dL (ref 8.4–10.5)
CHLORIDE: 97 meq/L (ref 96–112)
CO2: 30 mEq/L (ref 19–32)
Creatinine, Ser: 1.3 mg/dL (ref 0.40–1.50)
GFR: 58.2 mL/min — ABNORMAL LOW (ref >60.00)
Glucose, Bld: 258 mg/dL — ABNORMAL HIGH (ref 70–99)
POTASSIUM: 4 meq/L (ref 3.5–5.1)
SODIUM: 136 meq/L (ref 135–145)
TOTAL PROTEIN: 6.9 g/dL (ref 6.0–8.3)
Total Bilirubin: 1.9 mg/dL — ABNORMAL HIGH (ref 0.2–1.2)

## 2015-10-21 LAB — PROTIME-INR
INR: 2.2 ratio — AB (ref 0.8–1.0)
Prothrombin Time: 23.8 s — ABNORMAL HIGH (ref 9.6–13.1)

## 2015-10-21 LAB — CBC WITH DIFFERENTIAL/PLATELET
Basophils Absolute: 0 10*3/uL (ref 0.0–0.1)
Basophils Relative: 0.4 % (ref 0.0–3.0)
EOS ABS: 0.2 10*3/uL (ref 0.0–0.7)
EOS PCT: 2.7 % (ref 0.0–5.0)
HEMATOCRIT: 40.2 % (ref 39.0–52.0)
Hemoglobin: 13.5 g/dL (ref 13.0–17.0)
Lymphocytes Relative: 16.3 % (ref 12.0–46.0)
Lymphs Abs: 1 10*3/uL (ref 0.7–4.0)
MCHC: 33.5 g/dL (ref 30.0–36.0)
MCV: 87.1 fl (ref 78.0–100.0)
Monocytes Absolute: 0.5 10*3/uL (ref 0.1–1.0)
Monocytes Relative: 8.7 % (ref 3.0–12.0)
NEUTROS ABS: 4.5 10*3/uL (ref 1.4–7.7)
Neutrophils Relative %: 71.9 % (ref 43.0–77.0)
PLATELETS: 212 10*3/uL (ref 150.0–400.0)
RBC: 4.62 Mil/uL (ref 4.22–5.81)
RDW: 14.8 % (ref 11.5–15.5)
WBC: 6.3 10*3/uL (ref 4.0–10.5)

## 2015-10-21 LAB — LIPASE: LIPASE: 19 U/L (ref 11.0–59.0)

## 2015-10-21 LAB — BRAIN NATRIURETIC PEPTIDE: PRO B NATRI PEPTIDE: 364 pg/mL — AB (ref 0.0–100.0)

## 2015-10-21 LAB — LACTIC ACID, PLASMA: LACTIC ACID: 0.7 mmol/L (ref 0.5–2.2)

## 2015-10-21 MED ORDER — IOHEXOL 300 MG/ML  SOLN
100.0000 mL | Freq: Once | INTRAMUSCULAR | Status: AC | PRN
  Administered 2015-10-21: 100 mL via INTRAVENOUS

## 2015-10-21 NOTE — Telephone Encounter (Signed)
Went over instructions with the patient over the phone and diabetic instructions  Will sign consent when he arrives for procedure

## 2015-10-21 NOTE — Progress Notes (Signed)
Patient ID: Charles [name], male   DOB: 12/26/1945, 69 y.o.   MRN: [id number]   Subjective:    Patient ID: Charles [name], male    DOB: Mar 07, 1946, 69 y.o.   MRN: [id number]  HPI  Charles [name] is a pleasant 69 year old white male new to GI today, who comes in with complaints of a 6 month history of postprandial abdominal pain. He says this happens every time he eats no matter what he eats or how much she eats and usually starts within 10 minutes of finishing a meal and may last 30-40 minutes. He gets pain in the upper right side of his abdomen which radiates to his epigastrium and sometimes up into his chest. Last week he had a particularly bad episode on 10/16/2015 abdomen frequently becomes tight and distended feeling with this these episodes. This episode lasted longer was more severe and he went to the emergency room because he also felt somewhat short of breath. He was seen at [facility] and had a CTangio which showed a 1.5 cm lesion in the right hepatic lobe normal-appearing gallbladder and mild atrophy of the pancreas and some calcified plaque at the origin of the celiac and SMA no aneurysm or dissection. Labs were pertinent for an elevated lactic acid level at 2.4 lipase was 170 within their normal range BNP 417 WBC 7.5, hemoglobin 13.8, total bili 2.1, AST of 309, ALP of 160 and alkaline phosphatase of 100. He then presented to the emergency room here in [place] and had bedside ultrasound done interpreted by the ER physician and read as no gallstones common bile duct normal and no gallbladder wall thickening. After anti-emetics and analgesics his pain eventually settled down and he was allowed discharged home. Since that time he has continued to feel ill. He says he did have chills that day but no documented fever. Ever since then he has been eating very light primarily soup etc. and still has milder symptoms of upper abdominal discomfort bloating and nausea. He's been unable to sleep well  due to discomfort. He says his urine was darker after having the imaging studies done but seems to be a bit lighter today. Other medical problems include sleep apnea, atrial fibrillation, adult-onset diabetes mellitus, history of ischemic heart disease with last EF measured at 60% T status post pacemaker placement. He also had a resection of 12 inches of small bowel and an appendectomy at age 69.  Review of Systems Pertinent positive and negative review of systems were noted in the above HPI section.  All other review of systems was otherwise negative.  Outpatient Encounter Prescriptions as of 10/21/2015  Medication Sig  . albuterol (PROVENTIL HFA;VENTOLIN HFA) 108 (90 BASE) MCG/ACT inhaler Inhale 1 puff into the lungs every 6 (six) hours as needed for wheezing or shortness of breath.  [name] glimepiride (AMARYL) 4 MG tablet Take 4 mg by mouth 2 (two) times daily.  . hydrochlorothiazide (HYDRODIURIL) 25 MG tablet Take 1 tablet (25 mg total) by mouth daily.  . insulin regular (NOVOLIN R,HUMULIN R) 100 units/mL injection Inject 38 Units into the skin 2 (two) times daily before a meal.   . lisinopril (PRINIVIL,ZESTRIL) 40 MG tablet Take 1 tablet (40 mg total) by mouth daily.  . metFORMIN (GLUCOPHAGE) 500 MG tablet Take 500 mg by mouth 2 (two) times daily with a meal. With meal  . NOVOLIN N RELION 100 UNIT/ML injection Inject 38 Units into the skin 2 (two) times daily before a meal. Use as directecd  .  sotalol (BETAPACE) 120 MG tablet TAKE ONE TABLET BY MOUTH TWICE DAILY  . traMADol-acetaminophen (ULTRACET) 37.5-325 MG per tablet Take 1 tablet by mouth every 6 (six) hours as needed for moderate pain (pain).   . TURMERIC CURCUMIN PO Take 1 capsule by mouth daily.  [name] warfarin (COUMADIN) 5 MG tablet TAKE 1&1/2 TO 2 TABLETS BY MOUTH ONCE DAILY AS DIRECTED BY COUMADIN CLINIC (Patient taking differently: TAKE 1&1/2 TO 2 TABLETS BY MOUTH ONCE DAILY AS DIRECTED BY COUMADIN CLINIC. 5mg  all days except Wed. 7.5mg  on  Weds)  . [DISCONTINUED] glyBURIDE micronized (GLYNASE) 3 MG tablet Take 3 mg by mouth 2 (two) times daily.   No facility-administered encounter medications on file as of 10/21/2015.   Allergies  Allergen Reactions  . Statins Other (See Comments)    Mental changes, muscle aches  . Chocolate   . Codeine     Itching  . Oxytetracycline     Flushing in sunlight   Patient Active Problem List   Diagnosis Date Noted  . OSA (obstructive sleep apnea)   . Atrial fibrillation ([place]) 03/10/2013  . Long term (current) use of anticoagulants 03/10/2013  . COLONIC POLYPS, HX OF 02/18/2010  . DIABETES MELLITUS, TYPE II 01/03/2008  . Hyperlipidemia 01/03/2008  . Obesity 01/03/2008  . HYPERTENSION, BENIGN 01/03/2008  . Chronic ischemic heart disease 01/03/2008  . BRONCHITIS, OBSTRUCTIVE CHRONIC 01/03/2008  . OSTEOARTHRITIS, KNEE 01/03/2008  . OTHER DYSPHAGIA 01/03/2008  . NEPHROLITHIASIS, HX OF 01/03/2008   Social History   Social History  . Marital Status: Married    Spouse Name: N/A  . Number of Children: N/A  . Years of Education: N/A   Occupational History  . Not on file.   Social History Main Topics  . Smoking status: Former Smoker    Types: Cigarettes    Quit date: 11/23/1987  . Smokeless tobacco: Not on file  . Alcohol Use: No  . Drug Use: No  . Sexual Activity: Not on file   Other Topics Concern  . Not on file   Social History Narrative   Married   Works at [employer] and also as [employer] at the [employer]    Charles [name]'s family history includes Arthritis in his sister; CAD in an other family member; Epilepsy in his brother; Heart disease in his mother; Hypertension in an other family member; Lung cancer in his maternal grandfather; Obesity in an other family member; Stroke in his maternal grandmother and paternal grandfather.      Objective:    Filed Vitals:   10/21/15 0836  BP: 130/82  Pulse: 70    Physical Exam well-developed older white male in  no acute distress, fatigued-appearing, question early scleral icterus blood pressure 130/82 pulse 70 height 5 foot 9 weight 273. HEENT; nontraumatic normocephalic EOMI PERRLA sclera. Early icterus, Cardiovascular; regular rate and rhythm with S1-S2, Pulmonary ;clear bilaterally, Abdomen; large soft he has some mild rather generalized tenderness and is more tender with deep palpation in the right upper quadrant and epigastrium no guarding or rebound no palpable mass or hepatosplenomegaly no bruit heard, Rectal ;exam not done, Extremities ;no clubbing cyanosis or edema, Neuropsych; mood and affect appropriate       Assessment & Plan:   #1 69 yo male with 6 month hx of post prandial RUQ and epigastric pain with nausea-severe sxs on 10/16/15 with ER visit and workup including CT angio, and limited bedside US which did not reveal any evidence for cholecystitis or choledocholithiasis though  LFT's elevated.  Other consideration would ne mesenteric ischemia/insufficiency -with plaque but no critical stenosis of celiac and SMA His sxs are persisting , continues to feel poorly ans is unable eat much. May need hospitalization Will proceed with  Stat labs this am, and after discussion with Dr Ardis Hughs will order Ct abd/pelvis with IV and oral contrast today >ideally would like  MRI/MRCP but pacemaker precludes ...  Possible HIDA depending on above  #2 chronic anticoagulation-coumadin #3 CAD , ischemic heart disease EF 60% #4 Afib #5 AODM  #6 s/p pacemaker  #7 obesity #8 s/p appendectomy   Herman Fiero S Mikahla Wisor PA-C 10/21/2015   Cc: Hamrick, Lorin Mercy, MD

## 2015-10-21 NOTE — Patient Instructions (Signed)
You have been scheduled for a CT scan of the abdomen and pelvis at [facility] ([address]---this is in the same building as Press [facility]).   You are scheduled on 10/21/2015 at   3:15pm  . You should arrive 15 minutes prior to your appointment time for registration. Please follow the written instructions below on the day of your exam:  WARNING: IF YOU ARE ALLERGIC TO IODINE/X-RAY DYE, PLEASE NOTIFY RADIOLOGY IMMEDIATELY AT [phone]! YOU WILL BE GIVEN A 13 HOUR PREMEDICATION PREP.  1) Do not eat or drink anything after 11am (4 hours prior to your test) 2) You have been given 2 bottles of oral contrast to drink. The solution may taste               better if refrigerated, but do NOT add ice or any other liquid to this solution. Shake             well before drinking.    Drink 1 bottle of contrast @ 1:15pm (2 hours prior to your exam)  Drink 1 bottle of contrast @ 2:15pm(1 hour prior to your exam)  You may take any medications as prescribed with a small amount of water except for the following: Metformin, Glucophage, Glucovance, Avandamet, Riomet, Fortamet, Actoplus Met, Janumet, Glumetza or Metaglip. The above medications must be held the day of the exam AND 48 hours after the exam.  The purpose of you drinking the oral contrast is to aid in the visualization of your intestinal tract. The contrast solution may cause some diarrhea. Before your exam is started, you will be given a small amount of fluid to drink. Depending on your individual set of symptoms, you may also receive an intravenous injection of x-ray contrast/dye. Plan on being at [facility] for 30 minutes or long, depending on the type of exam you are having performed.  This test typically takes 30-45 minutes to complete.  If you have any questions regarding your exam or if you need to reschedule, you may call the CT department at [phone] between the hours of 8:00 am and 5:00 pm,  Monday-Friday.  ________________________________________________________________________

## 2015-10-22 NOTE — Telephone Encounter (Signed)
Ok..discussed with Beth, plan as outlined in office note- pt feeling about the same

## 2015-10-22 NOTE — Progress Notes (Signed)
i agree with the above note, plan 

## 2015-10-22 NOTE — Telephone Encounter (Signed)
Spoke with the spouse. DPR is on file. She and the patient were unclear on the plan of care. She did not come with him to the appointment and he didn't remember details. Discussed the procedure day and what the plan of care is at this point. She reports he is a little better, but he is not eating much. She is concerned about the weight loss. Directions for the EGD faxed to her. She thinks the patient may have misplaced them. She wasn't aware he needed a care partner.

## 2015-10-22 NOTE — Telephone Encounter (Signed)
Pt is calling back requesting to speak directly to [facility]

## 2015-10-24 ENCOUNTER — Encounter: Payer: Self-pay | Admitting: Gastroenterology

## 2015-10-24 ENCOUNTER — Ambulatory Visit (AMBULATORY_SURGERY_CENTER): Payer: BLUE CROSS/BLUE SHIELD | Admitting: Gastroenterology

## 2015-10-24 VITALS — BP 133/75 | HR 70 | Temp 96.3°F | Resp 16 | Ht 69.0 in | Wt 273.0 lb

## 2015-10-24 DIAGNOSIS — B9681 Helicobacter pylori [H. pylori] as the cause of diseases classified elsewhere: Secondary | ICD-10-CM | POA: Diagnosis not present

## 2015-10-24 DIAGNOSIS — K2970 Gastritis, unspecified, without bleeding: Secondary | ICD-10-CM

## 2015-10-24 DIAGNOSIS — K2950 Unspecified chronic gastritis without bleeding: Secondary | ICD-10-CM | POA: Diagnosis not present

## 2015-10-24 DIAGNOSIS — R100 Acute abdomen: Secondary | ICD-10-CM | POA: Diagnosis present

## 2015-10-24 LAB — GLUCOSE, CAPILLARY
GLUCOSE-CAPILLARY: 189 mg/dL — AB (ref 65–99)
Glucose-Capillary: 218 mg/dL — ABNORMAL HIGH (ref 65–99)

## 2015-10-24 MED ORDER — OMEPRAZOLE 40 MG PO CPDR: 40.0000 mg | DELAYED_RELEASE_CAPSULE | Freq: Every day | ORAL | Status: DC

## 2015-10-24 MED ORDER — SODIUM CHLORIDE 0.9 % IV SOLN: 500.0000 mL | INTRAVENOUS | Status: DC

## 2015-10-24 NOTE — Op Note (Signed)
[place]  [address] [place], [id number]   ENDOSCOPY PROCEDURE REPORT  PATIENT: Charles, [name]  MR#: [id number] BIRTHDATE: 1946/05/01 , 76  yrs. old GENDER: male ENDOSCOPIST: [name], MD PROCEDURE DATE:  10/24/2015 PROCEDURE:  EGD w/ biopsy ASA CLASS:     Class III INDICATIONS:  abdominal pain, elevated liver tests, normal CT scan.  MEDICATIONS: Monitored anesthesia care and Propofol 150 mg IV TOPICAL ANESTHETIC: none  DESCRIPTION OF PROCEDURE: After the risks benefits and alternatives of the procedure were thoroughly explained, informed consent was obtained.  The LB [id number] endoscope was introduced through the mouth and advanced to the second portion of the duodenum , Without limitations.  The instrument was slowly withdrawn as the mucosa was fully examined.    There was moderate non-specific pan-gastritis.  The stomach was biopsied in antrum and body and sent to pathology (jar 1).  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  The examination was otherwise normal.  Retroflexed views revealed no abnormalities.     The scope was then withdrawn from the patient and the procedure completed.  COMPLICATIONS: There were no immediate complications.  ENDOSCOPIC IMPRESSION: There was moderate non-specific pan-gastritis.  The stomach was biopsied in antrum and body and sent to pathology (jar 1).  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  The examination was otherwise normal  RECOMMENDATIONS: The Bayer Back and Body contains 500mg  aspirin. This, at twice a day dosing, may have caused your abdominal pains. Please try to completely refrain from taking this or any other NSAID type medicines.  Please start (new prescription given) 40mg  omeprazole once daily, 20-30 min prior to BF meal.   Please come to the office early next week (monday or tuesday) for repeat labs (LFTs).    eSigned:  [name], MD 10/24/2015 2:39 PM

## 2015-10-24 NOTE — Patient Instructions (Addendum)
Stop the Bayer,Body and Back. Avoid NSAIDS (Motrin,Advil,Aleve etc)  Start Omeprazole 40 mg once daily, 20-30 minutes prior to breakfast.  COME TO THE OFFICE EARLY NEST WEEK(MONDAY OR Tuesday) FOR REPEAT LABS (LFT)     YOU HAD AN ENDOSCOPIC PROCEDURE TODAY AT [facility]:   Refer to the procedure report that was given to you for any specific questions about what was found during the examination.  If the procedure report does not answer your questions, please call your gastroenterologist to clarify.  If you requested that your care partner not be given the details of your procedure findings, then the procedure report has been included in a sealed envelope for you to review at your convenience later.  YOU SHOULD EXPECT: Some feelings of bloating in the abdomen. Passage of more gas than usual.  Walking can help get rid of the air that was put into your GI tract during the procedure and reduce the bloating. If you had a lower endoscopy (such as a colonoscopy or flexible sigmoidoscopy) you may notice spotting of blood in your stool or on the toilet paper. If you underwent a bowel prep for your procedure, you may not have a normal bowel movement for a few days.  Please Note:  You might notice some irritation and congestion in your nose or some drainage.  This is from the oxygen used during your procedure.  There is no need for concern and it should clear up in a day or so.  SYMPTOMS TO REPORT IMMEDIATELY:   Following upper endoscopy (EGD)  Vomiting of blood or coffee ground material  New chest pain or pain under the shoulder blades  Painful or persistently difficult swallowing  New shortness of breath  Fever of 100F or higher  Black, tarry-looking stools  For urgent or emergent issues, a gastroenterologist can be reached at any hour by calling [phone].   DIET: Your first meal following the procedure should be a small meal and then it is ok to progress to your normal  diet. Heavy or fried foods are harder to digest and may make you feel nauseous or bloated.  Likewise, meals heavy in dairy and vegetables can increase bloating.  Drink plenty of fluids but you should avoid alcoholic beverages for 24 hours.  ACTIVITY:  You should plan to take it easy for the rest of today and you should NOT DRIVE or use heavy machinery until tomorrow (because of the sedation medicines used during the test).    FOLLOW UP: Our staff will call the number listed on your records the next business day following your procedure to check on you and address any questions or concerns that you may have regarding the information given to you following your procedure. If we do not reach you, we will leave a message.  However, if you are feeling well and you are not experiencing any problems, there is no need to return our call.  We will assume that you have returned to your regular daily activities without incident.  If any biopsies were taken you will be contacted by phone or by letter within the next 1-3 weeks.  Please call us at [phone] if you have not heard about the biopsies in 3 weeks.    SIGNATURES/CONFIDENTIALITY: You and/or your care partner have signed paperwork which will be entered into your electronic medical record.  These signatures attest to the fact that that the information above on your After Visit Summary has been reviewed and  is understood.  Full responsibility of the confidentiality of this discharge information lies with you and/or your care-partner.  Gastritis handout provided, please review. Biopsy results pending

## 2015-10-24 NOTE — Progress Notes (Signed)
Called to room to assist during endoscopic procedure.  Patient ID and intended procedure confirmed with present staff. Received instructions for my participation in the procedure from the performing physician.  

## 2015-10-24 NOTE — Progress Notes (Signed)
Report to PACU, RN, vss, BBS= Clear.  

## 2015-10-27 ENCOUNTER — Telehealth: Payer: Self-pay | Admitting: *Deleted

## 2015-10-27 NOTE — Telephone Encounter (Signed)
  Follow up Call-  Call back number 10/24/2015  Post procedure Call Back phone  # [phone]  Permission to leave phone message Yes     Patient questions:  Do you have a fever, pain , or abdominal swelling? No. Pain Score  0 *  Have you tolerated food without any problems? Yes.    Have you been able to return to your normal activities? Yes.    Do you have any questions about your discharge instructions: Diet   No. Medications  No. Follow up visit  No.  Do you have questions or concerns about your Care? No.  Actions: * If pain score is 4 or above: No action needed, pain <4.

## 2015-10-28 ENCOUNTER — Other Ambulatory Visit: Payer: Self-pay | Admitting: *Deleted

## 2015-10-28 ENCOUNTER — Other Ambulatory Visit (INDEPENDENT_AMBULATORY_CARE_PROVIDER_SITE_OTHER): Payer: BLUE CROSS/BLUE SHIELD

## 2015-10-28 ENCOUNTER — Other Ambulatory Visit: Payer: Self-pay

## 2015-10-28 DIAGNOSIS — R7989 Other specified abnormal findings of blood chemistry: Secondary | ICD-10-CM

## 2015-10-28 DIAGNOSIS — R945 Abnormal results of liver function studies: Principal | ICD-10-CM

## 2015-10-28 LAB — HEPATIC FUNCTION PANEL
ALBUMIN: 3.8 g/dL (ref 3.5–5.2)
ALT: 49 U/L (ref 0–53)
AST: 15 U/L (ref 0–37)
Alkaline Phosphatase: 102 U/L (ref 39–117)
Bilirubin, Direct: 0.4 mg/dL — ABNORMAL HIGH (ref 0.0–0.3)
Total Bilirubin: 0.9 mg/dL (ref 0.2–1.2)
Total Protein: 7.4 g/dL (ref 6.0–8.3)

## 2015-10-28 MED ORDER — SOTALOL HCL 120 MG PO TABS: 120.0000 mg | ORAL_TABLET | Freq: Two times a day (BID) | ORAL | Status: DC

## 2015-10-28 MED ORDER — HYDROCHLOROTHIAZIDE 25 MG PO TABS: 25.0000 mg | ORAL_TABLET | Freq: Every day | ORAL | Status: DC

## 2015-11-03 ENCOUNTER — Telehealth: Payer: Self-pay

## 2015-11-03 MED ORDER — AMOXICILL-CLARITHRO-LANSOPRAZ PO MISC: Freq: Two times a day (BID) | ORAL | Status: DC

## 2015-11-03 NOTE — Telephone Encounter (Signed)
Prescription has been sent as requested per Dr Ardis Hughs.  Pt aware

## 2015-11-03 NOTE — Telephone Encounter (Signed)
Dr Ardis Hughs pt aware of the H pylori, I tried to send prev pac but received a warning regarding the prev pac and warfarin interaction and increased bleeding risk.  Do you want to change prescription?  He is also allergic to Oxytetracycline.  Please advise.

## 2015-11-03 NOTE — Telephone Encounter (Signed)
Please continue with prev pac antibiotics, thanks

## 2015-11-05 ENCOUNTER — Telehealth: Payer: Self-pay | Admitting: Internal Medicine

## 2015-11-05 NOTE — Telephone Encounter (Signed)
Pharmacy called and med has been broken down separately so that it is cost prohibitive.  Pharmacy will contact the pt

## 2015-11-13 ENCOUNTER — Telehealth: Payer: Self-pay | Admitting: Internal Medicine

## 2015-11-13 DIAGNOSIS — R7989 Other specified abnormal findings of blood chemistry: Secondary | ICD-10-CM

## 2015-11-13 DIAGNOSIS — R945 Abnormal results of liver function studies: Secondary | ICD-10-CM

## 2015-11-13 MED ORDER — DICYCLOMINE HCL 10 MG PO CAPS: 10.0000 mg | ORAL_CAPSULE | Freq: Three times a day (TID) | ORAL | Status: DC

## 2015-11-13 NOTE — Telephone Encounter (Signed)
Pt has been notified and rx sent to the pharmacy. Pt has also been notified to have labs next week, he will finish abx.

## 2015-11-13 NOTE — Telephone Encounter (Signed)
Dicyclomine 10 mg qc hrs 1-2 prn abd pain, diarrhea  # 60 no refill  Continue H pylori Tx  He needs an INR at his clinic  this week or next since he has been Tx w/ Abx

## 2015-11-13 NOTE — Telephone Encounter (Signed)
This is a Dr Carlean Purl pt, he states he is having upper abd pain that radiates to the left.  Also, bloating and not passing any gas.  Pt had 2 diarrhea stools this morning.  Pt also complains of nausea without vomiting.  Pt has appt with Dr Carlean Purl 12/15/15.  He has been taking omeprazole 40 mg daily 20-30 min prior to breakfast and is halfway through the course of antibiotics for H pylori.  Please advise

## 2015-11-18 ENCOUNTER — Other Ambulatory Visit (INDEPENDENT_AMBULATORY_CARE_PROVIDER_SITE_OTHER): Payer: BLUE CROSS/BLUE SHIELD

## 2015-11-18 DIAGNOSIS — R7989 Other specified abnormal findings of blood chemistry: Secondary | ICD-10-CM | POA: Diagnosis not present

## 2015-11-18 DIAGNOSIS — R945 Abnormal results of liver function studies: Secondary | ICD-10-CM

## 2015-11-18 LAB — PROTIME-INR
INR: 2.9 ratio — ABNORMAL HIGH (ref 0.8–1.0)
PROTHROMBIN TIME: 30.9 s — AB (ref 9.6–13.1)

## 2015-11-20 ENCOUNTER — Ambulatory Visit (INDEPENDENT_AMBULATORY_CARE_PROVIDER_SITE_OTHER): Payer: BLUE CROSS/BLUE SHIELD | Admitting: Pharmacist Clinician (PhC)/ Clinical Pharmacy Specialist

## 2015-11-20 DIAGNOSIS — Z7901 Long term (current) use of anticoagulants: Secondary | ICD-10-CM

## 2015-11-20 DIAGNOSIS — I480 Paroxysmal atrial fibrillation: Secondary | ICD-10-CM

## 2015-11-20 NOTE — Progress Notes (Signed)
Quick Note:  INR at 2.9 - this is probably ok but needs to check in with anti-coag clinic also - next week He has been taking Abx for H pylori so that can drive INR up I ______

## 2015-11-25 ENCOUNTER — Ambulatory Visit (INDEPENDENT_AMBULATORY_CARE_PROVIDER_SITE_OTHER): Payer: BLUE CROSS/BLUE SHIELD | Admitting: Pharmacist Clinician (PhC)/ Clinical Pharmacy Specialist

## 2015-11-25 DIAGNOSIS — I480 Paroxysmal atrial fibrillation: Secondary | ICD-10-CM

## 2015-11-25 DIAGNOSIS — Z7901 Long term (current) use of anticoagulants: Secondary | ICD-10-CM | POA: Diagnosis not present

## 2015-11-25 DIAGNOSIS — I4891 Unspecified atrial fibrillation: Secondary | ICD-10-CM | POA: Diagnosis not present

## 2015-11-25 LAB — POCT INR: INR: 2

## 2015-11-26 ENCOUNTER — Telehealth: Payer: Self-pay

## 2015-11-26 MED ORDER — DICYCLOMINE HCL 10 MG PO CAPS: 10.0000 mg | ORAL_CAPSULE | Freq: Three times a day (TID) | ORAL | Status: DC

## 2015-11-26 NOTE — Telephone Encounter (Signed)
Refill x 1 fine

## 2015-11-26 NOTE — Telephone Encounter (Signed)
Received dicyclomine refill request, please advise, has appt with Dr Carlean Purl 12/15/15.

## 2015-12-11 ENCOUNTER — Telehealth: Payer: Self-pay

## 2015-12-11 MED ORDER — DICYCLOMINE HCL 10 MG PO CAPS: 10.0000 mg | ORAL_CAPSULE | Freq: Three times a day (TID) | ORAL | Status: DC

## 2015-12-11 NOTE — Telephone Encounter (Signed)
Received dicyclomine request, has appointment 12/15/15 with Dr Carlean Purl.  Will get him to advise if ok to refill.

## 2015-12-11 NOTE — Telephone Encounter (Signed)
Ok to refill x 1  

## 2015-12-15 ENCOUNTER — Encounter: Payer: Self-pay | Admitting: Internal Medicine

## 2015-12-15 ENCOUNTER — Ambulatory Visit (INDEPENDENT_AMBULATORY_CARE_PROVIDER_SITE_OTHER): Payer: BLUE CROSS/BLUE SHIELD | Admitting: Internal Medicine

## 2015-12-15 VITALS — BP 150/90 | HR 68 | Ht 68.0 in | Wt 277.0 lb

## 2015-12-15 DIAGNOSIS — R6881 Early satiety: Secondary | ICD-10-CM

## 2015-12-15 DIAGNOSIS — R1010 Upper abdominal pain, unspecified: Secondary | ICD-10-CM

## 2015-12-15 DIAGNOSIS — K589 Irritable bowel syndrome without diarrhea: Secondary | ICD-10-CM

## 2015-12-15 NOTE — Patient Instructions (Signed)
   You have been scheduled for an abdominal ultrasound at [facility] (1st floor of hospital) on 12/22/15 at 9:00AM. Please arrive 15 minutes prior to your appointment for registration. Make certain not to have anything to eat or drink 6 hours prior to your appointment. Should you need to reschedule your appointment, please contact radiology at [phone]. This test typically takes about 30 minutes to perform.    I appreciate the opportunity to care for you. Silvano Rusk, MD, [facility]

## 2015-12-15 NOTE — Progress Notes (Signed)
Subjective:    Patient ID: Charles [name], male    DOB: [birth year], 70 y.o.   MRN: [id number]  CC: upper abdominal pain   HPI Charles [name] is a 70 male who presents for follow up on upper abdominal pain and IBS symptoms. He was last seen by Amy, PA, after an upper abdominal attack after Thanksgiving. The patient was taking NSAIDs and underwent an upper endoscopy that showed gastritis and H. Pylori. He was started on dicyclomine and treated for the H. Pylori infection. He states he has been feeling better with the dicyclomine, but still has some RUQ pain and nausea before and after meals. His pain is sometimes also in the LLQ as well. The pain feels like a squeezing and feels as if he is bloated and has "swallowed a brick;" however, when it is a full blown attack it doubles him over. He can't attribute this to any event. When he eats smaller portions, he feels better as well. Taking two dicyclomine makes his skin sensitive and causes itching. He states he does have a BM everyday, but he frequently has loose stools and attributes this to the metformin. He also is concerned that his gallbladder may be causing this pain. He states that a hernia near his scrotum and umbilicus has been causing him discomfort. Patient still works as an Radio broadcast assistant and has increased stress right now. He denies vomiting, constipation, and weight loss. His last colonoscopy was normal in 2007.   Wt Readings from Last 3 Encounters:  12/15/15 277 lb (125.646 kg)  10/24/15 273 lb (123.832 kg)  10/21/15 273 lb 8 oz (124.059 kg)   Medications, allergies, past medical history, past surgical history, family history and social history are reviewed and updated in the EMR.   Review of Systems  Constitutional: Negative for unexpected weight change.  Gastrointestinal: Positive for nausea, abdominal pain (upper abdominal and LLQ ) and diarrhea. Negative for vomiting and constipation.       Bloating   Genitourinary:       Left  scrotal discomfort  All other systems reviewed and are negative.      Objective:   Physical Exam  Constitutional: He appears well-developed.  obese  HENT:  Head: Normocephalic and atraumatic.  Eyes: No scleral icterus.  Neck: No tracheal deviation present.  Cardiovascular: Normal rate, regular rhythm and normal heart sounds.  Exam reveals no gallop and no friction rub.   No murmur heard. Pulmonary/Chest: Effort normal and breath sounds normal. No respiratory distress. He has no wheezes. He has no rales. He exhibits no tenderness.  Abdominal: Soft. Bowel sounds are normal. He exhibits mass. There is no tenderness. There is no rebound and no guarding. A hernia is present. Hernia confirmed positive in the ventral area. Hernia confirmed negative in the right inguinal area and confirmed negative in the left inguinal area.    Genitourinary: Right testis shows no tenderness. Left testis shows no tenderness.  Skin: Skin is warm and dry.   Tiny easily reducible hernia - seems fatty, just above umbilicus        Assessment & Plan:  Upper abdominal pain - Plan: US Abdomen Complete Will call with results -if he has gallstones it could be causing some of his symptoms though the bulk of his symptoms seem related to gut dysfunction probably irritable bowel syndrome or functional dyspepsia-like problems. He remains concerned about the gallbladder. He had a negative point of care ultrasound in the emergency room earlier. We will perform  a dedicated radiology abdominal ultrasound.  Early satiety Eat smaller portions   IBS (irritable bowel syndrome) Will continue dicyclomine before meals  Probiotic x 1 mo - continue if helping   Umbilical hernia  Monitor  Did not notice an inguinal hernia today    The patient was seen in conjunction with Debroah Baller PA student. She served as a Education administrator. Work above represents my history and physical exam.  CC: Charles Sake, MD

## 2015-12-22 ENCOUNTER — Ambulatory Visit: Payer: BLUE CROSS/BLUE SHIELD | Admitting: Internal Medicine

## 2015-12-22 ENCOUNTER — Ambulatory Visit (HOSPITAL_COMMUNITY)
Admission: RE | Admit: 2015-12-22 | Discharge: 2015-12-22 | Disposition: A | Discharge status: Home / Self Care | Payer: BLUE CROSS/BLUE SHIELD | Source: Ambulatory Visit | Attending: Internal Medicine | Admitting: Internal Medicine

## 2015-12-22 DIAGNOSIS — K7689 Other specified diseases of liver: Secondary | ICD-10-CM | POA: Diagnosis not present

## 2015-12-22 DIAGNOSIS — R1010 Upper abdominal pain, unspecified: Secondary | ICD-10-CM | POA: Diagnosis not present

## 2015-12-22 DIAGNOSIS — N200 Calculus of kidney: Secondary | ICD-10-CM | POA: Insufficient documentation

## 2015-12-22 DIAGNOSIS — K760 Fatty (change of) liver, not elsewhere classified: Secondary | ICD-10-CM | POA: Diagnosis not present

## 2015-12-23 NOTE — Progress Notes (Signed)
Quick Note:  Let him know US shows a fatty liver which itself can cause pain  I think his problems are mostly IBS - intestinal spasms, etc and he should continue what he is doing - hopefully there will be less stress which is a trigger  See me prn  He should know that based upon all of the testing there is no sign of anything bad going on  If he is not improved enough after 2-3 months he can return ______

## 2015-12-29 ENCOUNTER — Other Ambulatory Visit: Payer: Self-pay

## 2015-12-29 MED ORDER — DICYCLOMINE HCL 10 MG PO CAPS: 10.0000 mg | ORAL_CAPSULE | Freq: Three times a day (TID) | ORAL | Status: DC

## 2015-12-30 ENCOUNTER — Other Ambulatory Visit: Payer: Self-pay

## 2015-12-30 ENCOUNTER — Other Ambulatory Visit: Payer: Self-pay | Admitting: Internal Medicine

## 2015-12-30 MED ORDER — HYDROCHLOROTHIAZIDE 25 MG PO TABS: 25.0000 mg | ORAL_TABLET | Freq: Every day | ORAL | Status: DC

## 2015-12-30 NOTE — Telephone Encounter (Signed)
error 

## 2016-01-05 ENCOUNTER — Ambulatory Visit (INDEPENDENT_AMBULATORY_CARE_PROVIDER_SITE_OTHER): Payer: BLUE CROSS/BLUE SHIELD | Admitting: Pharmacist Clinician (PhC)/ Clinical Pharmacy Specialist

## 2016-01-05 ENCOUNTER — Encounter: Payer: Self-pay | Admitting: Internal Medicine

## 2016-01-05 ENCOUNTER — Ambulatory Visit (INDEPENDENT_AMBULATORY_CARE_PROVIDER_SITE_OTHER): Payer: BLUE CROSS/BLUE SHIELD | Admitting: Internal Medicine

## 2016-01-05 VITALS — BP 148/84 | HR 70 | Ht 68.0 in | Wt 278.8 lb

## 2016-01-05 DIAGNOSIS — Z7901 Long term (current) use of anticoagulants: Secondary | ICD-10-CM | POA: Diagnosis not present

## 2016-01-05 DIAGNOSIS — I259 Chronic ischemic heart disease, unspecified: Secondary | ICD-10-CM

## 2016-01-05 DIAGNOSIS — I4891 Unspecified atrial fibrillation: Secondary | ICD-10-CM

## 2016-01-05 DIAGNOSIS — I480 Paroxysmal atrial fibrillation: Secondary | ICD-10-CM

## 2016-01-05 DIAGNOSIS — G4733 Obstructive sleep apnea (adult) (pediatric): Secondary | ICD-10-CM

## 2016-01-05 DIAGNOSIS — I495 Sick sinus syndrome: Secondary | ICD-10-CM | POA: Diagnosis not present

## 2016-01-05 DIAGNOSIS — I10 Essential (primary) hypertension: Secondary | ICD-10-CM | POA: Diagnosis not present

## 2016-01-05 LAB — CUP PACEART INCLINIC DEVICE CHECK
Battery Impedance: 2300 Ohm
Date Time Interrogation Session: 20170213124816
Implantable Lead Implant Date: 20091007
Implantable Lead Location: 753859
Implantable Lead Location: 753860
Lead Channel Pacing Threshold Amplitude: 0.75 V
Lead Channel Sensing Intrinsic Amplitude: 1.7 mV
Lead Channel Sensing Intrinsic Amplitude: 12 mV
Lead Channel Setting Pacing Pulse Width: 0.4 ms
MDC IDC LEAD IMPLANT DT: 20091007
MDC IDC MSMT BATTERY VOLTAGE: 2.76 V
MDC IDC MSMT LEADCHNL RA IMPEDANCE VALUE: 458 Ohm
MDC IDC MSMT LEADCHNL RA PACING THRESHOLD AMPLITUDE: 1 V
MDC IDC MSMT LEADCHNL RA PACING THRESHOLD PULSEWIDTH: 0.4 ms
MDC IDC MSMT LEADCHNL RV IMPEDANCE VALUE: 603 Ohm
MDC IDC MSMT LEADCHNL RV PACING THRESHOLD PULSEWIDTH: 0.4 ms
MDC IDC SET LEADCHNL RA PACING AMPLITUDE: 2 V
MDC IDC SET LEADCHNL RV PACING AMPLITUDE: 2.5 V
MDC IDC SET LEADCHNL RV SENSING SENSITIVITY: 2 mV
Pulse Gen Serial Number: 1266370

## 2016-01-05 LAB — BASIC METABOLIC PANEL
BUN: 19 mg/dL (ref 7–25)
CO2: 26 mmol/L (ref 20–31)
Calcium: 9.2 mg/dL (ref 8.6–10.3)
Chloride: 102 mmol/L (ref 98–110)
Creat: 1.27 mg/dL — ABNORMAL HIGH (ref 0.70–1.25)
Glucose, Bld: 225 mg/dL — ABNORMAL HIGH (ref 65–99)
POTASSIUM: 4.8 mmol/L (ref 3.5–5.3)
SODIUM: 137 mmol/L (ref 135–146)

## 2016-01-05 LAB — HEPATIC FUNCTION PANEL
ALT: 16 U/L (ref 9–46)
AST: 13 U/L (ref 10–35)
Albumin: 4 g/dL (ref 3.6–5.1)
Alkaline Phosphatase: 59 U/L (ref 40–115)
BILIRUBIN DIRECT: 0.1 mg/dL (ref <=0.2)
BILIRUBIN INDIRECT: 0.2 mg/dL (ref 0.2–1.2)
BILIRUBIN TOTAL: 0.3 mg/dL (ref 0.2–1.2)
Total Protein: 7.3 g/dL (ref 6.1–8.1)

## 2016-01-05 LAB — POCT INR: INR: 1.8

## 2016-01-05 LAB — MAGNESIUM: MAGNESIUM: 1.8 mg/dL (ref 1.5–2.5)

## 2016-01-05 MED ORDER — RIVAROXABAN 20 MG PO TABS: 20.0000 mg | ORAL_TABLET | Freq: Every day | ORAL | Status: DC

## 2016-01-05 NOTE — Patient Instructions (Addendum)
Medication Instructions:  1)Stop Coumadin 2) Start Xarelto 20 mg daily   Labwork: Your physician recommends that you return for lab work today:BMP/MAG/liver      Testing/Procedures: None ordered   Follow-Up: Your physician wants you to follow-up in: 6 months with the Truitt Merle, NP(device checked same day) and 12 months with Dr. [name]. You will receive a reminder letter in the mail two months in advance. If you don't receive a letter, please call our office to schedule the follow-up appointment.   Any Other Special Instructions Will Be Listed Below (If Applicable).     If you need a refill on your cardiac medications before your next appointment, please call your pharmacy.

## 2016-01-05 NOTE — Progress Notes (Signed)
Electrophysiology Office Note   Date:  01/05/2016   ID:  Charles [name], DOB 03-26-1946, MRN [id number]  PCP:  [name], MD  Primary Electrophysiologist:  Melissa Tomaselli    Chief Complaint  Patient presents with  . Atrial Fibrillation     History of Present Illness: Charles [name] is a 70 y.o. male who presents today for electrophysiology evaluation.   He is functionally limited by knee pain.  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  His afib is well controlled.  CHF is stable.  He continues to wear his CPAP.    Today, he denies symptoms of palpitations, chest pain, shortness of breath, orthopnea, PND, lower extremity edema, claudication, dizziness, presyncope, syncope, bleeding, or neurologic sequela. The patient is tolerating medications without difficulties and is otherwise without complaint today.    Past Medical History  Diagnosis Date  . Hypertension   . Coronary artery disease     last cath 2008,  remote PCI of the LAD with Lcx/OM bifurcation stenting and proximal RCA stenting  . Obstructive chronic bronchitis ([place])   . Other dysphagia   . Osteoarthritis, knee   . Dyslipidemia   . Exogenous obesity     severe  . Hx of colonic polyps   . Nephrolithiasis   . Long term (current) use of anticoagulants   . OSA (obstructive sleep apnea)   . Chronic venous insufficiency     with prior venous stasis ulcers  . Diabetes mellitus, type II ([place])     AODM  . Statin intolerance     Hx of. Now tolerating Zetia & Livalo well.   . Weakness     In bilateral legs. Duplex performed 04/02/13   . Fatigue   . DOE (dyspnea on exertion)     TEE 07/10/13 - LV EF 40-45%  . History of nuclear stress test 01/02/13    OVERALL IMPRESSION: Low risk stress nuclear study. Fixed inferolateral defect, likely scar. No reversible ischemia. LV wall motion: Inferolateral hypokinesis. LVEF 47%. ([place])  . MI (myocardial infarction) ([place]) 1995  . 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 Jude Zephyr XL DR 5826, dual chamber, rate responsive. No arrhythmias recorded and he has an excellent threshold.  . SSS (sick sinus syndrome) ([place])   . PAF (paroxysmal atrial fibrillation) ([place])   . LBBB (left bundle branch block)     He has developed a native LBBB which was seen on his last visit of March 2014 (From OV note 07/03/13)   . Regional wall motion abnormality of heart     By TEE 07/10/13  . Chronic systolic dysfunction of left ventricle     EF40-45%  . Left atrial dilatation     moderate by echo   Past Surgical History  Procedure Laterality Date  . Pacemaker insertion  08/28/08    St. Jude Zephyr XL DR 5826, dual chamber, rate responsive. No arrhythmias recorded and he has an excellent threshold.  . Coronary angioplasty with stent placement  1998 & 2008    Last cath in 2008 and he has had remote interventions with LAD stenting, Cx/OM bifurcation stenting, and proximal right coronary stent  . Appendectomy  1962  . Small intestine surgery  1962    age 30     Current Outpatient Prescriptions  Medication Sig Dispense Refill  . albuterol (PROVENTIL HFA;VENTOLIN HFA) 108 (90 BASE) MCG/ACT inhaler Inhale 1 puff into the lungs  every 6 (six) hours as needed for wheezing or shortness of breath. Reported on 12/15/2015    . dicyclomine (BENTYL) 10 MG capsule Take 1-2 capsules (10-20 mg total) by mouth 4 (four) times daily -  before meals and at bedtime. 60 capsule 0  . glimepiride (AMARYL) 4 MG tablet Take 4 mg by mouth 2 (two) times daily.    . hydrochlorothiazide (HYDRODIURIL) 25 MG tablet Take 1 tablet (25 mg total) by mouth daily. 30 tablet 1  . HYDROcodone-acetaminophen (NORCO/VICODIN) 5-325 MG tablet Take 1 tablet by mouth 2 (two) times daily as needed for moderate pain.    [name] insulin regular (NOVOLIN R,HUMULIN R) 100 units/mL injection Inject 38 Units into the skin 2 (two) times daily before a meal.     . lisinopril (PRINIVIL,ZESTRIL) 40 MG tablet  Take 1 tablet (40 mg total) by mouth daily. 90 tablet 3  . metFORMIN (GLUCOPHAGE) 500 MG tablet Take 500 mg by mouth 2 (two) times daily with a meal. With meal    . NOVOLIN N RELION 100 UNIT/ML injection Inject 38 Units into the skin daily with lunch. Use as directecd    . omeprazole (PRILOSEC) 40 MG capsule Take 1 capsule (40 mg total) by mouth daily before breakfast. 90 capsule 3  . sotalol (BETAPACE) 120 MG tablet Take 1 tablet (120 mg total) by mouth 2 (two) times daily. 60 tablet 2  . traMADol-acetaminophen (ULTRACET) 37.5-325 MG per tablet Take 1 tablet by mouth every 6 (six) hours as needed for moderate pain (pain).     . TURMERIC CURCUMIN PO Take 1 capsule by mouth daily.    [name] warfarin (COUMADIN) 5 MG tablet TAKE 1&1/2 TO 2 TABLETS BY MOUTH ONCE DAILY AS DIRECTED BY COUMADIN CLINIC (Patient taking differently: TAKE 1&1/2 TO 2 TABLETS BY MOUTH ONCE DAILY AS DIRECTED BY COUMADIN CLINIC. 5mg  all days except Wed. 7.5mg  on Weds) 150 tablet 1   No current facility-administered medications for this visit.    Allergies:   Statins; Chocolate; Codeine; and Oxytetracycline   Social History:  The patient  reports that he quit smoking about 41 years ago. His smoking use included Cigarettes. He has never used smokeless tobacco. He reports that he does not drink alcohol or use illicit drugs.   Family History:  The patient's family history includes Arthritis in his sister; Colon polyps in his sister; Epilepsy in his brother; Heart disease in his mother; Hypertension in his sister; Lung cancer in his maternal grandfather; Stroke in his maternal grandmother and paternal grandfather.    ROS:  Please see the history of present illness.  All other systems are reviewed and negative.    PHYSICAL EXAM: VS:  BP 148/84 mmHg  Pulse 70  Ht 5\' 8"  (1.727 m)  Wt 278 lb 12.8 oz (126.463 kg)  BMI 42.40 kg/m2 , BMI Body mass index is 42.4 kg/(m^2). GEN: overweight, well developed, in no acute distress HEENT:  normal Neck: no JVD, carotid bruits, or masses Cardiac: RRR; no murmurs, rubs, or gallops,no edema  Respiratory:  clear to auscultation bilaterally, normal work of breathing GI: soft, nontender, nondistended, + BS MS: no deformity or atrophy Skin: warm and dry,  device pocket is well healed Neuro:  Strength and sensation are intact Psych: euthymic mood, full affect  EKG:  EKG is ordered today. The ekg ordered today shows atrial pacing 70 bpm, AV 180, LBBB, Qtc 466   Device interrogation is reviewed today in detail.  See PaceArt for details.  Recent Labs: 10/21/2015: BUN 17; Creatinine, Ser 1.30; Hemoglobin 13.5; Platelets 212.0; Potassium 4.0; Pro B Natriuretic peptide (BNP) 364.0*; Sodium 136 10/28/2015: ALT 49    Lipid Panel     Component Value Date/Time   CHOL  12/14/2008 0845    117        ATP III CLASSIFICATION:  <200     mg/dL   Desirable  200-239  mg/dL   Borderline High  >=240    mg/dL   High          TRIG 97 12/14/2008 0845   HDL 39* 12/14/2008 0845   CHOLHDL 3.0 12/14/2008 0845   VLDL 19 12/14/2008 0845   LDLCALC  12/14/2008 0845    59        Total Cholesterol/HDL:CHD Risk Coronary Heart Disease Risk Table                     Men   Women  1/2 Average Risk   3.4   3.3  Average Risk       5.0   4.4  2 X Average Risk   9.6   7.1  3 X Average Risk  23.4   11.0        Use the calculated Patient Ratio above and the CHD Risk Table to determine the patient's CHD Risk.        ATP III CLASSIFICATION (LDL):  <100     mg/dL   Optimal  100-129  mg/dL   Near or Above                    Optimal  130-159  mg/dL   Borderline  160-189  mg/dL   High  >190     mg/dL   Very High     Wt Readings from Last 3 Encounters:  01/05/16 278 lb 12.8 oz (126.463 kg)  12/15/15 277 lb (125.646 kg)  10/24/15 273 lb (123.832 kg)     ASSESSMENT AND PLAN:  1. Sick sinus syndrome Normal pacemaker function See Pace Art report No changes today  2. Chronic systolic dysfunction  (EF 40-45%)--> returned to normal by echo 7/15 Stable symptoms  3. CAD No ischemic symptoms Has not tolerated statins.  Will ask lipid clinic to assess for pcsk9 candidacy.  4. afib Well controlled with sotalol daily chads2vasc score is at least 5. He should continue life long anticoagulation.  He is on coumadin. bmet and mg on sotalol today Today, I discussed Coumadin as well as novel anticoagulants including Pradaxa, Xarelto, Savaysa, and Eliquis today as indicated for risk reduction in stroke and systemic emboli with nonvalvular atrial fibrillation.  Risks, benefits, and alternatives to each of these drugs were discussed at length today.  I will therefore stop coumadin and start on xarelto 20mg  daily.  5. OSA Compliance with CPAP encouraged (he is compliant)  6. Obesity Weight reduction is encouraged  7. Hypertension Stable I have encouraged lifestyle modification No changes today  8. HL Recently abnormal lfts, will repeat today Has not tolerated statins.  Will ask lipid clinic to assess for pcsk9 candidacy.   Return to see Truitt Merle in 6 months.  Needs device interrogation at that time. I will see in 12 months    Current medicines are reviewed at length with the patient today.   The patient does not have concerns regarding his medicines.  The following changes were made today:  none    Signed, Thompson Grayer, MD 01/05/2016 12:50  PM     [address] [place] [address] [phone] (office) [phone] (fax)

## 2016-01-12 ENCOUNTER — Telehealth: Payer: Self-pay | Admitting: *Deleted

## 2016-01-12 ENCOUNTER — Other Ambulatory Visit: Payer: Self-pay | Admitting: *Deleted

## 2016-01-12 MED ORDER — NITROGLYCERIN 0.4 MG SL SUBL
0.4000 mg | SUBLINGUAL_TABLET | SUBLINGUAL | Status: DC | PRN
Start: 2016-01-12 — End: 2016-09-21

## 2016-01-12 NOTE — Telephone Encounter (Signed)
Patient requested an rx [facility]. Nitro is not on his current or historical med list. He stated that it was originally prescribed to him by Dr Rollene Fare. Please advise. Thanks, MI

## 2016-01-12 NOTE — Telephone Encounter (Signed)
Okay to fill per Dr [name]

## 2016-01-15 ENCOUNTER — Telehealth: Payer: Self-pay | Admitting: Pharmacist Clinician (PhC)/ Clinical Pharmacy Specialist

## 2016-01-15 NOTE — Telephone Encounter (Signed)
-----   Message from Thompson Grayer, MD sent at 01/05/2016  3:21 PM EST ----- Erasmo Downer,  Could you look at him for pcsk9?  Also needs lipid clinic management (previously referred) I have stopped coumadin since he has to come from [facility] every month.  He is now on xarelto. We should try to get patients that have to travel that far switched from coumadin when feasible I think.  Thanks! JA

## 2016-01-19 ENCOUNTER — Telehealth: Payer: Self-pay | Admitting: Internal Medicine

## 2016-01-19 NOTE — Telephone Encounter (Signed)
Patient is taking one ac meals and one at bedtime so 4 daily.  He said it helps.  He has had no attacks.  He wants to know does he continue to take it regularly or prn?

## 2016-01-19 NOTE — Telephone Encounter (Signed)
Spoke with patient about option of PCSK-9 inhibitor.  We had discussed these briefly about 18 months ago when they were first coming to market, but patient wanted to wait.  He has Medicare, but his prescription coverage is thru his wife's [employer].  Will contact his pharmacy for that particular information as is not on his [place] card.

## 2016-01-19 NOTE — Telephone Encounter (Signed)
Please advise Sir? 

## 2016-01-19 NOTE — Telephone Encounter (Signed)
Please ask him - is he taking 1 or 2 capsules and how many does he use each day and will Rx

## 2016-01-20 MED ORDER — DICYCLOMINE HCL 10 MG PO CAPS: 10.0000 mg | ORAL_CAPSULE | Freq: Three times a day (TID) | ORAL | Status: DC

## 2016-01-20 NOTE — Telephone Encounter (Signed)
Informed patient of Dr Celesta Aver advise and he verbalized understanding.

## 2016-01-20 NOTE — Telephone Encounter (Signed)
Will prescribe 10 qac/HS # 360 3 refills  I suggest he experiment with dropping nighttime dose and move to as needed over time.Marland KitchenMarland KitchenIf these abnormal clinical findings persist, appropriate workup will be completed. The patient understands that follow up is required to elucidate the situation. If that leads to more problems then back to regular dosing

## 2016-01-20 NOTE — Telephone Encounter (Signed)
Left message for him to call me back.  Sent rx in for the dicyclomine.

## 2016-01-21 ENCOUNTER — Telehealth: Payer: Self-pay | Admitting: Internal Medicine

## 2016-01-21 HISTORY — PX: UMBILICAL HERNIA REPAIR: SHX196

## 2016-01-21 NOTE — Telephone Encounter (Signed)
Pharmacy gave him only #60 of his dicyclomine and I sent in #360.  I have left a message for them to call me back.

## 2016-01-21 NOTE — Telephone Encounter (Signed)
Spoke with the pharmacist and he said what happened was the 3 didn't show up so he will re-bill it and give him #300 more pills, patient informed.

## 2016-01-26 ENCOUNTER — Encounter: Payer: BLUE CROSS/BLUE SHIELD | Admitting: Pharmacist Clinician (PhC)/ Clinical Pharmacy Specialist

## 2016-01-26 ENCOUNTER — Other Ambulatory Visit: Payer: Self-pay

## 2016-01-27 MED ORDER — SOTALOL HCL 120 MG PO TABS: 120.0000 mg | ORAL_TABLET | Freq: Two times a day (BID) | ORAL | Status: DC

## 2016-02-05 ENCOUNTER — Emergency Department (HOSPITAL_COMMUNITY): Payer: BLUE CROSS/BLUE SHIELD

## 2016-02-05 ENCOUNTER — Encounter (HOSPITAL_COMMUNITY): Payer: Self-pay | Admitting: Emergency Medicine

## 2016-02-05 ENCOUNTER — Inpatient Hospital Stay (HOSPITAL_COMMUNITY)
Admission: EM | Admit: 2016-02-05 | Discharge: 2016-02-10 | DRG: 418 | Disposition: A | Discharge status: Home / Self Care | Payer: BLUE CROSS/BLUE SHIELD | Attending: Family Medicine | Admitting: Family Medicine

## 2016-02-05 DIAGNOSIS — N400 Enlarged prostate without lower urinary tract symptoms: Secondary | ICD-10-CM | POA: Diagnosis present

## 2016-02-05 DIAGNOSIS — E1122 Type 2 diabetes mellitus with diabetic chronic kidney disease: Secondary | ICD-10-CM | POA: Diagnosis present

## 2016-02-05 DIAGNOSIS — N179 Acute kidney failure, unspecified: Secondary | ICD-10-CM | POA: Diagnosis present

## 2016-02-05 DIAGNOSIS — K219 Gastro-esophageal reflux disease without esophagitis: Secondary | ICD-10-CM | POA: Diagnosis present

## 2016-02-05 DIAGNOSIS — I4891 Unspecified atrial fibrillation: Secondary | ICD-10-CM

## 2016-02-05 DIAGNOSIS — Z7901 Long term (current) use of anticoagulants: Secondary | ICD-10-CM

## 2016-02-05 DIAGNOSIS — Z794 Long term (current) use of insulin: Secondary | ICD-10-CM

## 2016-02-05 DIAGNOSIS — Z87891 Personal history of nicotine dependence: Secondary | ICD-10-CM

## 2016-02-05 DIAGNOSIS — K810 Acute cholecystitis: Secondary | ICD-10-CM

## 2016-02-05 DIAGNOSIS — R1013 Epigastric pain: Secondary | ICD-10-CM | POA: Diagnosis not present

## 2016-02-05 DIAGNOSIS — K8071 Calculus of gallbladder and bile duct without cholecystitis with obstruction: Secondary | ICD-10-CM

## 2016-02-05 DIAGNOSIS — K8020 Calculus of gallbladder without cholecystitis without obstruction: Secondary | ICD-10-CM | POA: Diagnosis not present

## 2016-02-05 DIAGNOSIS — Z6841 Body Mass Index (BMI) 40.0 and over, adult: Secondary | ICD-10-CM

## 2016-02-05 DIAGNOSIS — K8010 Calculus of gallbladder with chronic cholecystitis without obstruction: Principal | ICD-10-CM | POA: Diagnosis present

## 2016-02-05 DIAGNOSIS — E662 Morbid (severe) obesity with alveolar hypoventilation: Secondary | ICD-10-CM | POA: Diagnosis present

## 2016-02-05 DIAGNOSIS — Z955 Presence of coronary angioplasty implant and graft: Secondary | ICD-10-CM

## 2016-02-05 DIAGNOSIS — E669 Obesity, unspecified: Secondary | ICD-10-CM

## 2016-02-05 DIAGNOSIS — K429 Umbilical hernia without obstruction or gangrene: Secondary | ICD-10-CM | POA: Diagnosis present

## 2016-02-05 DIAGNOSIS — E1165 Type 2 diabetes mellitus with hyperglycemia: Secondary | ICD-10-CM | POA: Diagnosis present

## 2016-02-05 DIAGNOSIS — G4733 Obstructive sleep apnea (adult) (pediatric): Secondary | ICD-10-CM | POA: Diagnosis present

## 2016-02-05 DIAGNOSIS — E119 Type 2 diabetes mellitus without complications: Secondary | ICD-10-CM

## 2016-02-05 DIAGNOSIS — E785 Hyperlipidemia, unspecified: Secondary | ICD-10-CM | POA: Diagnosis present

## 2016-02-05 DIAGNOSIS — I129 Hypertensive chronic kidney disease with stage 1 through stage 4 chronic kidney disease, or unspecified chronic kidney disease: Secondary | ICD-10-CM | POA: Diagnosis present

## 2016-02-05 DIAGNOSIS — I2510 Atherosclerotic heart disease of native coronary artery without angina pectoris: Secondary | ICD-10-CM | POA: Diagnosis present

## 2016-02-05 DIAGNOSIS — R740 Nonspecific elevation of levels of transaminase and lactic acid dehydrogenase [LDH]: Secondary | ICD-10-CM

## 2016-02-05 DIAGNOSIS — N183 Chronic kidney disease, stage 3 (moderate): Secondary | ICD-10-CM | POA: Diagnosis present

## 2016-02-05 DIAGNOSIS — R7401 Elevation of levels of liver transaminase levels: Secondary | ICD-10-CM

## 2016-02-05 DIAGNOSIS — J449 Chronic obstructive pulmonary disease, unspecified: Secondary | ICD-10-CM | POA: Diagnosis present

## 2016-02-05 DIAGNOSIS — Z01810 Encounter for preprocedural cardiovascular examination: Secondary | ICD-10-CM | POA: Insufficient documentation

## 2016-02-05 DIAGNOSIS — I252 Old myocardial infarction: Secondary | ICD-10-CM

## 2016-02-05 DIAGNOSIS — Z950 Presence of cardiac pacemaker: Secondary | ICD-10-CM

## 2016-02-05 DIAGNOSIS — I480 Paroxysmal atrial fibrillation: Secondary | ICD-10-CM | POA: Diagnosis present

## 2016-02-05 DIAGNOSIS — I447 Left bundle-branch block, unspecified: Secondary | ICD-10-CM | POA: Diagnosis present

## 2016-02-05 HISTORY — DX: Personal history of other medical treatment: Z92.89

## 2016-02-05 HISTORY — DX: Unspecified osteoarthritis, unspecified site: M19.90

## 2016-02-05 HISTORY — DX: Other complications of anesthesia, initial encounter: T88.59XA

## 2016-02-05 HISTORY — DX: Obstructive sleep apnea (adult) (pediatric): G47.33

## 2016-02-05 HISTORY — DX: Unspecified asthma, uncomplicated: J45.909

## 2016-02-05 HISTORY — DX: Obstructive sleep apnea (adult) (pediatric): Z99.89

## 2016-02-05 HISTORY — DX: Adverse effect of unspecified anesthetic, initial encounter: T41.45XA

## 2016-02-05 HISTORY — DX: Type 2 diabetes mellitus without complications: E11.9

## 2016-02-05 HISTORY — DX: Presence of cardiac pacemaker: Z95.0

## 2016-02-05 HISTORY — DX: Gastro-esophageal reflux disease without esophagitis: K21.9

## 2016-02-05 LAB — COMPREHENSIVE METABOLIC PANEL
ALBUMIN: 3.9 g/dL (ref 3.5–5.0)
ALT: 228 U/L — ABNORMAL HIGH (ref 17–63)
ANION GAP: 10 (ref 5–15)
AST: 339 U/L — ABNORMAL HIGH (ref 15–41)
Alkaline Phosphatase: 96 U/L (ref 38–126)
BILIRUBIN TOTAL: 3 mg/dL — AB (ref 0.3–1.2)
BUN: 18 mg/dL (ref 6–20)
CO2: 26 mmol/L (ref 22–32)
Calcium: 9.6 mg/dL (ref 8.9–10.3)
Chloride: 101 mmol/L (ref 101–111)
Creatinine, Ser: 1.25 mg/dL — ABNORMAL HIGH (ref 0.61–1.24)
GFR calc non Af Amer: 57 mL/min — ABNORMAL LOW (ref >60)
GLUCOSE: 254 mg/dL — AB (ref 65–99)
POTASSIUM: 4.8 mmol/L (ref 3.5–5.1)
SODIUM: 137 mmol/L (ref 135–145)
TOTAL PROTEIN: 7.5 g/dL (ref 6.5–8.1)

## 2016-02-05 LAB — URINALYSIS, ROUTINE W REFLEX MICROSCOPIC
Glucose, UA: 500 mg/dL — AB
HGB URINE DIPSTICK: NEGATIVE
KETONES UR: NEGATIVE mg/dL
Leukocytes, UA: NEGATIVE
NITRITE: NEGATIVE
PROTEIN: NEGATIVE mg/dL
Specific Gravity, Urine: 1.021 (ref 1.005–1.030)
pH: 5 (ref 5.0–8.0)

## 2016-02-05 LAB — BILIRUBIN, FRACTIONATED(TOT/DIR/INDIR)
BILIRUBIN TOTAL: 4.4 mg/dL — AB (ref 0.3–1.2)
Bilirubin, Direct: 2.7 mg/dL — ABNORMAL HIGH (ref 0.1–0.5)
Indirect Bilirubin: 1.7 mg/dL — ABNORMAL HIGH (ref 0.3–0.9)

## 2016-02-05 LAB — GLUCOSE, CAPILLARY
GLUCOSE-CAPILLARY: 255 mg/dL — AB (ref 65–99)
GLUCOSE-CAPILLARY: 236 mg/dL — AB (ref 65–99)
GLUCOSE-CAPILLARY: 145 mg/dL — AB (ref 65–99)
Glucose-Capillary: 150 mg/dL — ABNORMAL HIGH (ref 65–99)

## 2016-02-05 LAB — CBC
HCT: 41.4 % (ref 39.0–52.0)
HEMOGLOBIN: 13.5 g/dL (ref 13.0–17.0)
MCH: 29.2 pg (ref 26.0–34.0)
MCHC: 32.6 g/dL (ref 30.0–36.0)
MCV: 89.6 fL (ref 78.0–100.0)
PLATELETS: 201 10*3/uL (ref 150–400)
RBC: 4.62 MIL/uL (ref 4.22–5.81)
RDW: 14 % (ref 11.5–15.5)
WBC: 9.1 10*3/uL (ref 4.0–10.5)

## 2016-02-05 LAB — LIPASE, BLOOD: Lipase: 33 U/L (ref 11–51)

## 2016-02-05 MED ORDER — DICYCLOMINE HCL 10 MG PO CAPS: 10.0000 mg | ORAL_CAPSULE | Freq: Three times a day (TID) | ORAL | Status: DC

## 2016-02-05 MED ORDER — HYDROCHLOROTHIAZIDE 25 MG PO TABS
25.0000 mg | ORAL_TABLET | Freq: Every day | ORAL | Status: DC
  Administered 2016-02-05: 25 mg via ORAL
  Filled 2016-02-05: qty 1

## 2016-02-05 MED ORDER — INSULIN ASPART 100 UNIT/ML ~~LOC~~ SOLN
0.0000 [IU] | Freq: Every day | SUBCUTANEOUS | Status: DC
  Administered 2016-02-06 – 2016-02-08 (×2): 2 [IU] via SUBCUTANEOUS

## 2016-02-05 MED ORDER — ONDANSETRON HCL 4 MG PO TABS: 4.0000 mg | ORAL_TABLET | Freq: Four times a day (QID) | ORAL | Status: DC | PRN

## 2016-02-05 MED ORDER — INSULIN NPH (HUMAN) (ISOPHANE) 100 UNIT/ML ~~LOC~~ SUSP
40.0000 [IU] | Freq: Two times a day (BID) | SUBCUTANEOUS | Status: DC
  Administered 2016-02-05 – 2016-02-08 (×6): 40 [IU] via SUBCUTANEOUS
  Filled 2016-02-05: qty 10

## 2016-02-05 MED ORDER — MORPHINE SULFATE (PF) 2 MG/ML IV SOLN
2.0000 mg | INTRAVENOUS | Status: DC | PRN
  Administered 2016-02-05: 2 mg via INTRAVENOUS
  Filled 2016-02-05: qty 1

## 2016-02-05 MED ORDER — ACETAMINOPHEN 325 MG PO TABS: 650.0000 mg | ORAL_TABLET | Freq: Four times a day (QID) | ORAL | Status: DC | PRN

## 2016-02-05 MED ORDER — DICYCLOMINE HCL 10 MG PO CAPS
10.0000 mg | ORAL_CAPSULE | Freq: Three times a day (TID) | ORAL | Status: DC
  Administered 2016-02-05: 10 mg via ORAL
  Filled 2016-02-05: qty 1

## 2016-02-05 MED ORDER — ONDANSETRON HCL 4 MG/2ML IJ SOLN
4.0000 mg | Freq: Once | INTRAMUSCULAR | Status: AC
  Administered 2016-02-05: 4 mg via INTRAVENOUS
  Filled 2016-02-05: qty 2

## 2016-02-05 MED ORDER — SODIUM CHLORIDE 0.9 % IV SOLN: INTRAVENOUS | Status: DC

## 2016-02-05 MED ORDER — PANTOPRAZOLE SODIUM 40 MG PO TBEC
40.0000 mg | DELAYED_RELEASE_TABLET | Freq: Every day | ORAL | Status: DC
  Administered 2016-02-05 – 2016-02-10 (×5): 40 mg via ORAL
  Filled 2016-02-05 (×5): qty 1

## 2016-02-05 MED ORDER — RIVAROXABAN 20 MG PO TABS
20.0000 mg | ORAL_TABLET | Freq: Every day | ORAL | Status: DC
  Administered 2016-02-05 – 2016-02-06 (×2): 20 mg via ORAL
  Filled 2016-02-05 (×2): qty 1

## 2016-02-05 MED ORDER — ACETAMINOPHEN 650 MG RE SUPP: 650.0000 mg | Freq: Four times a day (QID) | RECTAL | Status: DC | PRN

## 2016-02-05 MED ORDER — SOTALOL HCL 120 MG PO TABS
120.0000 mg | ORAL_TABLET | Freq: Two times a day (BID) | ORAL | Status: DC
  Administered 2016-02-05 – 2016-02-10 (×10): 120 mg via ORAL
  Filled 2016-02-05 (×15): qty 1

## 2016-02-05 MED ORDER — INSULIN ASPART 100 UNIT/ML ~~LOC~~ SOLN
0.0000 [IU] | Freq: Three times a day (TID) | SUBCUTANEOUS | Status: DC
  Administered 2016-02-05: 11 [IU] via SUBCUTANEOUS
  Administered 2016-02-05: 7 [IU] via SUBCUTANEOUS
  Administered 2016-02-05: 3 [IU] via SUBCUTANEOUS
  Administered 2016-02-06 (×2): 4 [IU] via SUBCUTANEOUS
  Administered 2016-02-07: 11 [IU] via SUBCUTANEOUS
  Administered 2016-02-07 – 2016-02-08 (×3): 7 [IU] via SUBCUTANEOUS
  Administered 2016-02-08 – 2016-02-09 (×3): 4 [IU] via SUBCUTANEOUS
  Administered 2016-02-09: 7 [IU] via SUBCUTANEOUS
  Administered 2016-02-10: 4 [IU] via SUBCUTANEOUS

## 2016-02-05 MED ORDER — ALFUZOSIN HCL ER 10 MG PO TB24
10.0000 mg | ORAL_TABLET | Freq: Every day | ORAL | Status: DC
  Administered 2016-02-05: 10 mg via ORAL
  Filled 2016-02-05: qty 1

## 2016-02-05 MED ORDER — LISINOPRIL 40 MG PO TABS
40.0000 mg | ORAL_TABLET | Freq: Every day | ORAL | Status: DC
  Administered 2016-02-05: 40 mg via ORAL
  Filled 2016-02-05: qty 1

## 2016-02-05 MED ORDER — MORPHINE SULFATE (PF) 4 MG/ML IV SOLN
6.0000 mg | Freq: Once | INTRAVENOUS | Status: AC
  Administered 2016-02-05: 6 mg via INTRAVENOUS
  Filled 2016-02-05: qty 2

## 2016-02-05 MED ORDER — ONDANSETRON HCL 4 MG/2ML IJ SOLN
4.0000 mg | Freq: Four times a day (QID) | INTRAMUSCULAR | Status: DC | PRN
  Administered 2016-02-09: 4 mg via INTRAVENOUS
  Filled 2016-02-05: qty 2

## 2016-02-05 MED ORDER — NITROGLYCERIN 0.4 MG SL SUBL: 0.4000 mg | SUBLINGUAL_TABLET | SUBLINGUAL | Status: DC | PRN

## 2016-02-05 MED ORDER — HYDROCODONE-ACETAMINOPHEN 5-325 MG PO TABS
1.0000 | ORAL_TABLET | Freq: Two times a day (BID) | ORAL | Status: DC | PRN
Start: 2016-02-05 — End: 2016-02-09
  Administered 2016-02-05: 1 via ORAL
  Filled 2016-02-05: qty 1

## 2016-02-05 NOTE — ED Provider Notes (Signed)
CSN: [id number]     Arrival date & time 02/05/16  0045 History  By signing my name below, I, Rayna Sexton, attest that this documentation has been prepared under the direction and in the presence of Everlene Balls, MD. Electronically Signed: Rayna Sexton, ED Scribe. 02/05/2016. 2:14 AM.      Chief Complaint  Patient presents with  . Abdominal Pain   The history is provided by the patient. No language interpreter was used.    HPI Comments: Charles [name] is a 70 y.o. male with a PMHx including DM and gastritis who presents to the Emergency Department complaining of intermittent, moderate, epigastric pain onset a few months ago and worsening beginning earlier today. He notes associated nausea as well as unassociated diarrhea which he says is a side effect of his metformin which he denies has acutely worsened. Pt notes a hx of similar symptoms and was dx with gastritis in the past. He reports receiving morphine in the past for abd pain which he says provides relief of his pain. Pt has had multiple abd US's performed in the past with nml results. Pt denies vomiting, fever, chills or other associated symptoms at this time.    Past Medical History  Diagnosis Date  . Hypertension   . Coronary artery disease     last cath 2008,  remote PCI of the LAD with Lcx/OM bifurcation stenting and proximal RCA stenting  . Obstructive chronic bronchitis ([place])   . Other dysphagia   . Osteoarthritis, knee   . Dyslipidemia   . Exogenous obesity     severe  . Hx of colonic polyps   . Nephrolithiasis   . Long term (current) use of anticoagulants   . OSA (obstructive sleep apnea)   . Chronic venous insufficiency     with prior venous stasis ulcers  . Diabetes mellitus, type II ([place])     AODM  . Statin intolerance     Hx of. Now tolerating Zetia & Livalo well.   . Weakness     In bilateral legs. Duplex performed 04/02/13   . Fatigue   . DOE (dyspnea on exertion)     TEE 07/10/13 - LV EF 40-45%  .  History of nuclear stress test 01/02/13    OVERALL IMPRESSION: Low risk stress nuclear study. Fixed inferolateral defect, likely scar. No reversible ischemia. LV wall motion: Inferolateral hypokinesis. LVEF 47%. ([place])  . MI (myocardial infarction) ([place]) 1995  . 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 Jude Zephyr XL DR 5826, dual chamber, rate responsive. No arrhythmias recorded and he has an excellent threshold.  . SSS (sick sinus syndrome) ([place])   . PAF (paroxysmal atrial fibrillation) ([place])   . LBBB (left bundle branch block)     He has developed a native LBBB which was seen on his last visit of March 2014 (From OV note 07/03/13)   . Regional wall motion abnormality of heart     By TEE 07/10/13  . Chronic systolic dysfunction of left ventricle     EF40-45%  . Left atrial dilatation     moderate by echo   Past Surgical History  Procedure Laterality Date  . Pacemaker insertion  08/28/08    St. Jude Zephyr XL DR 5826, dual chamber, rate responsive. No arrhythmias recorded and he has an excellent threshold.  . Coronary angioplasty with stent placement  1998 & 2008    Last cath in 2008 and he has had remote interventions with LAD stenting,  Cx/OM bifurcation stenting, and proximal right coronary stent  . Appendectomy  1962  . Small intestine surgery  1962    age 30   Family History  Problem Relation Age of Onset  . Hypertension Sister   . Heart disease Mother   . Arthritis Sister   . Epilepsy Brother   . Stroke Maternal Grandmother   . Lung cancer Maternal Grandfather   . Stroke Paternal Grandfather   . Colon polyps Sister    Social History  Substance Use Topics  . Smoking status: Former Smoker    Types: Cigarettes    Quit date: 11/22/1974  . Smokeless tobacco: Never Used  . Alcohol Use: No    Review of Systems A complete 10 system review of systems was obtained and all systems are negative except as noted in the HPI and PMH.   Allergies  Statins; Chocolate; Codeine; and  Oxytetracycline  Home Medications   Prior to Admission medications   Medication Sig Start Date End Date Taking? Authorizing Provider  albuterol (PROVENTIL HFA;VENTOLIN HFA) 108 (90 BASE) MCG/ACT inhaler Inhale 1 puff into the lungs every 6 (six) hours as needed for wheezing or shortness of breath. Reported on 12/15/2015    Historical Provider, MD  dicyclomine (BENTYL) 10 MG capsule Take 1-2 capsules (10-20 mg total) by mouth 4 (four) times daily -  before meals and at bedtime. 01/20/16   Gatha Mayer, MD  glimepiride (AMARYL) 4 MG tablet Take 4 mg by mouth 2 (two) times daily.    Historical Provider, MD  hydrochlorothiazide (HYDRODIURIL) 25 MG tablet Take 1 tablet (25 mg total) by mouth daily. 12/30/15   Thompson Grayer, MD  HYDROcodone-acetaminophen (NORCO/VICODIN) 5-325 MG tablet Take 1 tablet by mouth 2 (two) times daily as needed for moderate pain.    Historical Provider, MD  insulin regular (NOVOLIN R,HUMULIN R) 100 units/mL injection Inject 38 Units into the skin 2 (two) times daily before a meal.     Historical Provider, MD  lisinopril (PRINIVIL,ZESTRIL) 40 MG tablet Take 1 tablet (40 mg total) by mouth daily. 06/24/15   Thompson Grayer, MD  metFORMIN (GLUCOPHAGE) 500 MG tablet Take 500 mg by mouth 2 (two) times daily with a meal. With meal    Historical Provider, MD  nitroGLYCERIN (NITROSTAT) 0.4 MG SL tablet Place 1 tablet (0.4 mg total) under the tongue every 5 (five) minutes as needed for chest pain. 01/12/16   Thompson Grayer, MD  NOVOLIN N RELION 100 UNIT/ML injection Inject 38 Units into the skin daily with lunch. Use as directecd 02/26/14   Historical Provider, MD  omeprazole (PRILOSEC) 40 MG capsule Take 1 capsule (40 mg total) by mouth daily before breakfast. 10/24/15   [name], MD  rivaroxaban (XARELTO) 20 MG TABS tablet Take 1 tablet (20 mg total) by mouth daily with supper. 01/05/16   Thompson Grayer, MD  sotalol (BETAPACE) 120 MG tablet Take 1 tablet (120 mg total) by mouth 2 (two) times  daily. 01/27/16   Thompson Grayer, MD  traMADol-acetaminophen (ULTRACET) 37.5-325 MG per tablet Take 1 tablet by mouth every 6 (six) hours as needed for moderate pain (pain).  11/05/13   Historical Provider, MD  TURMERIC CURCUMIN PO Take 1 capsule by mouth daily.    Historical Provider, MD   BP 152/74 mmHg  Pulse 69  Temp(Src) 97.5 F (36.4 C) (Oral)  Resp 16  Ht 5\' 8"  (1.727 m)  Wt 279 lb (126.554 kg)  BMI 42.43 kg/m2  SpO2 99%  Physical Exam  Constitutional: He is oriented to person, place, and time. Vital signs are normal. He appears well-developed and well-nourished.  Non-toxic appearance. He does not appear ill. No distress.  HENT:  Head: Normocephalic and atraumatic.  Nose: Nose normal.  Mouth/Throat: Oropharynx is clear and moist. No oropharyngeal exudate.  Eyes: Conjunctivae and EOM are normal. Pupils are equal, round, and reactive to light. No scleral icterus.  Neck: Normal range of motion. Neck supple. No tracheal deviation, no edema, no erythema and normal range of motion present. No thyroid mass and no thyromegaly present.  Cardiovascular: Normal rate, regular rhythm, S1 normal, S2 normal, normal heart sounds, intact distal pulses and normal pulses.  Exam reveals no gallop and no friction rub.   No murmur heard. Pulmonary/Chest: Effort normal and breath sounds normal. No respiratory distress. He has no wheezes. He has no rhonchi. He has no rales.  Abdominal: Soft. Normal appearance and bowel sounds are normal. He exhibits distension. He exhibits no ascites and no mass. There is no hepatosplenomegaly. There is no tenderness. There is no rebound, no guarding and no CVA tenderness.  Musculoskeletal: Normal range of motion. He exhibits no edema or tenderness.  Lymphadenopathy:    He has no cervical adenopathy.  Neurological: He is alert and oriented to person, place, and time. He has normal strength. No cranial nerve deficit or sensory deficit.  Skin: Skin is warm, dry and intact.  No petechiae and no rash noted. He is not diaphoretic. No erythema. No pallor.  Psychiatric: He has a normal mood and affect. His behavior is normal. Judgment normal.  Nursing note and vitals reviewed.   ED Course  Procedures  DIAGNOSTIC STUDIES: Oxygen Saturation is 99% on RA, normal by my interpretation.    COORDINATION OF CARE: 2:04 AM Discussed next steps with pt and he agreed with the plan.   Labs Review Labs Reviewed  COMPREHENSIVE METABOLIC PANEL - Abnormal; Notable for the following:    Glucose, Bld 254 (*)    Creatinine, Ser 1.25 (*)    AST 339 (*)    ALT 228 (*)    Total Bilirubin 3.0 (*)    GFR calc non Af Amer 57 (*)    All other components within normal limits  URINALYSIS, ROUTINE W REFLEX MICROSCOPIC (NOT AT [facility]) - Abnormal; Notable for the following:    Color, Urine AMBER (*)    APPearance CLOUDY (*)    Glucose, UA 500 (*)    Bilirubin Urine SMALL (*)    All other components within normal limits  LIPASE, BLOOD  CBC    Imaging Review US Abdomen Limited Ruq  02/05/2016  CLINICAL DATA:  Transaminitis EXAM: US ABDOMEN LIMITED - RIGHT UPPER QUADRANT COMPARISON:  12/22/2015 FINDINGS: Gallbladder: Small subtly shadowing gallstones are now seen. There is no wall thickening or focal tenderness to suggest acute cholecystitis. Common bile duct: Diameter: 4 mm. Liver: Echogenic liver with poor acoustic transmission and pericholecystic sparing. Incidental 1 cm cyst in the right liver. IMPRESSION: 1. Cholelithiasis without signs of cholecystitis. 2. Hepatic steatosis. Electronically Signed   By: Monte Fantasia M.D.   On: 02/05/2016 02:40   I have personally reviewed and evaluated these images and lab results as part of my medical decision-making.   EKG Interpretation None      MDM   Final diagnoses:  None   Patient presents to emergency department for abdominal pain.lab studies reveals elevated LFTs and bilirubin. Ultrasound reveals cholelithiasis without any  cholecystitis. Patient need to  be admitted for ERCP. He was given morphine and Zofran for symptomatic control. I spoke with Dr. Tamala Julian with [facility] hospitalist who accepted the patient to [facility] for further care.   I personally performed the services described in this documentation, which was scribed in my presence. The recorded information has been reviewed and is accurate.     Everlene Balls, MD 02/05/16 [phone]

## 2016-02-05 NOTE — Progress Notes (Signed)
70 year old male admitted for abdominal pain, intermittent since last November, in addition he was found to have elevate dliver function panel. He was admitted earlier today by Dr Tamala Julian , please see Dr Thompson Caul note for detailed H&P.  Gastroenterology was consulted and recommended endoscopic ultrasound for evaluation of CBD stones ,plan for tomorrow.   Hosie Poisson, MD [phone]

## 2016-02-05 NOTE — ED Notes (Signed)
Pt. reports generalized abdominal pain with nausea onset yesterday , denies diarrhea , no fever or chills.

## 2016-02-05 NOTE — ED Notes (Signed)
Patient transported to Ultrasound 

## 2016-02-05 NOTE — H&P (Signed)
[facility] History and Physical  Charles [name] [id number] DOB: 1946/11/09 DOA: 02/05/2016  Referring physician: ED PCP: Charles [name]   Chief Complaint: Abdominal pain  HPI:  Charles [name] a 70 year old male with a complicated past medical history including SSS, atrial fibrillation, s/p PM, morbid obesity, diabetes mellitus type 2, and OSA on CPAP; who presents complaining of intermittent epigastric pain and abdominal distention. Symptoms are described as a bloating feeling as though his stomach and ribs are going to explode. Initially, first began experiencing less severe symptoms about 6 months ago. Reports taking Bentyl intermittently with mild relief of symptoms. Previously, episodes had been relatively short in duration until 3 days ago. He reports having 3 hours of abdominal distention and pain 2 days ago that self resolved. However, yesterday pain did not resolve and therefore he came in for further evaluation. Pain is described as severe. He reports having associated symptoms of diarrhea which she attributes to metformin, shortness of breath which she attributes to Xarelto. He reports being recently started on a new medication by his urologist named Urotraxol.  Upon admission patient is evaluated and blood work revealed AST 339, ALT 228, total direct bilirubin 3. Ultrasound reveals cholelithiasis without cholecystitis. Patient reports pain is tolerable after receiving morphine and notes that he is seen by Charles [name] of GI as an outpatient.    Review of Systems  Constitutional: Positive for malaise/fatigue. Negative for fever and chills.  HENT: Negative for hearing loss and tinnitus.   Eyes: Negative for double vision, photophobia and pain.  Respiratory: Positive for shortness of breath. Negative for hemoptysis.   Cardiovascular: Negative for leg swelling.  Gastrointestinal: Positive for nausea and abdominal pain. Negative for vomiting.  Genitourinary: Negative for  frequency and hematuria.  Musculoskeletal: Positive for joint pain. Negative for falls.  Skin: Negative for itching and rash.  Neurological: Negative for focal weakness and seizures.  Endo/Heme/Allergies: Negative for environmental allergies. Bruises/bleeds easily.  Psychiatric/Behavioral: The patient has insomnia.         Past Medical History  Diagnosis Date  . Hypertension   . Coronary artery disease     last cath 2008,  remote PCI of the LAD with Lcx/OM bifurcation stenting and proximal RCA stenting  . Obstructive chronic bronchitis ([place])   . Other dysphagia   . Osteoarthritis, knee   . Dyslipidemia   . Exogenous obesity     severe  . Hx of colonic polyps   . Nephrolithiasis   . Long term (current) use of anticoagulants   . OSA (obstructive sleep apnea)   . Chronic venous insufficiency     with prior venous stasis ulcers  . Diabetes mellitus, type II ([place])     AODM  . Statin intolerance     Hx of. Now tolerating Zetia & Livalo well.   . Weakness     In bilateral legs. Duplex performed 04/02/13   . Fatigue   . DOE (dyspnea on exertion)     TEE 07/10/13 - LV EF 40-45%  . History of nuclear stress test 01/02/13    OVERALL IMPRESSION: Low risk stress nuclear study. Fixed inferolateral defect, likely scar. No reversible ischemia. LV wall motion: Inferolateral hypokinesis. LVEF 47%. ([place])  . MI (myocardial infarction) ([place]) 1995  . 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 Jude Zephyr XL DR 5826, dual chamber, rate responsive. No arrhythmias recorded and he has an excellent threshold.  . SSS (sick sinus syndrome) ([place])   . PAF (paroxysmal atrial  fibrillation) ([place])   . LBBB (left bundle branch block)     He has developed a native LBBB which was seen on his last visit of March 2014 (From OV note 07/03/13)   . Regional wall motion abnormality of heart     By TEE 07/10/13  . Chronic systolic dysfunction of left ventricle     EF40-45%  . Left atrial dilatation     moderate  by echo     Past Surgical History  Procedure Laterality Date  . Pacemaker insertion  08/28/08    St. Jude Zephyr XL DR 5826, dual chamber, rate responsive. No arrhythmias recorded and he has an excellent threshold.  . Coronary angioplasty with stent placement  1998 & 2008    Last cath in 2008 and he has had remote interventions with LAD stenting, Cx/OM bifurcation stenting, and proximal right coronary stent  . Appendectomy  1962  . Small intestine surgery  1962    age 70      Social History:  reports that he quit smoking about 41 years ago. His smoking use included Cigarettes. He has never used smokeless tobacco. He reports that he does not drink alcohol or use illicit drugs. Where does patient live--home  and with whom if at home?With his wife Can patient participate in ADLs? Yes  Allergies  Allergen Reactions  . Chocolate Hives, Shortness Of Breath and Swelling  . Statins Other (See Comments)    Mental changes, muscle aches  . Codeine     Itching  . Oxytetracycline     Flushing in sunlight    Family History  Problem Relation Age of Onset  . Hypertension Sister   . Heart disease Mother   . Arthritis Sister   . Epilepsy Brother   . Stroke Maternal Grandmother   . Lung cancer Maternal Grandfather   . Stroke Paternal Grandfather   . Colon polyps Sister        Prior to Admission medications   Medication Sig Start Date End Date Taking? Authorizing Provider  albuterol (PROVENTIL HFA;VENTOLIN HFA) 108 (90 BASE) MCG/ACT inhaler Inhale 1 puff into the lungs every 6 (six) hours as needed for wheezing or shortness of breath. Reported on 12/15/2015   Yes Historical Provider, [name]  alfuzosin (UROXATRAL) 10 MG 24 hr tablet Take 10 mg by mouth daily with breakfast.   Yes Historical Provider, [name]  dicyclomine (BENTYL) 10 MG capsule Take 1-2 capsules (10-20 mg total) by mouth 4 (four) times daily -  before meals and at bedtime. 01/20/16  Yes Charles Mayer, [name]  glimepiride (AMARYL) 4 MG  tablet Take 4 mg by mouth 2 (two) times daily.   Yes Historical Provider, [name]  hydrochlorothiazide (HYDRODIURIL) 25 MG tablet Take 1 tablet (25 mg total) by mouth daily. 12/30/15  Yes Charles Grayer, [name]  HYDROcodone-acetaminophen (NORCO/VICODIN) 5-325 MG tablet Take 1 tablet by mouth 2 (two) times daily as needed for moderate pain.   Yes Historical Provider, [name]  insulin regular (NOVOLIN R,HUMULIN R) 100 units/mL injection Inject 40 Units into the skin 3 (three) times daily.    Yes Historical Provider, [name]  lisinopril (PRINIVIL,ZESTRIL) 40 MG tablet Take 1 tablet (40 mg total) by mouth daily. 06/24/15  Yes Charles Grayer, [name]  metFORMIN (GLUCOPHAGE) 500 MG tablet Take 1,000 mg by mouth daily. With meal   Yes Historical Provider, [name]  nitroGLYCERIN (NITROSTAT) 0.4 MG SL tablet Place 1 tablet (0.4 mg total) under the tongue every 5 (five) minutes as needed for chest  pain. 01/12/16  Yes Charles Grayer, [name]  NOVOLIN N RELION 100 UNIT/ML injection Inject 40 Units into the skin 2 (two) times daily. Use as directecd 02/26/14  Yes Historical Provider, [name]  omeprazole (PRILOSEC) 40 MG capsule Take 1 capsule (40 mg total) by mouth daily before breakfast. 10/24/15  Yes [name], [name]  ondansetron (ZOFRAN) 4 MG tablet Take 4 mg by mouth every 8 (eight) hours as needed for nausea or vomiting.   Yes Historical Provider, [name]  rivaroxaban (XARELTO) 20 MG TABS tablet Take 1 tablet (20 mg total) by mouth daily with supper. 01/05/16  Yes Charles Grayer, [name]  sotalol (BETAPACE) 120 MG tablet Take 1 tablet (120 mg total) by mouth 2 (two) times daily. 01/27/16  Yes Charles Grayer, [name]  traMADol-acetaminophen (ULTRACET) 37.5-325 MG per tablet Take 1 tablet by mouth daily.  11/05/13  Yes Historical Provider, [name]     Physical Exam: Filed Vitals:   02/05/16 0142 02/05/16 0200 02/05/16 0215 02/05/16 0300  BP: 146/73 143/57 142/70 144/58  Pulse: 70 73 70 75  Temp: 98 F (36.7 C)     TempSrc: Oral     Resp: 14 13 19 20   Height:      Weight:       SpO2: 99% 100% 98% 98%     Constitutional: Vital signs reviewed. Patient is a obese male who appears to be in no acute distress at this time. Head: Normocephalic and atraumatic  Ear: TM normal bilaterally  Mouth: no erythema or exudates, MMM  Eyes: PERRL, EOMI, conjunctivae normal, No scleral icterus.  Neck: Supple, Trachea midline normal ROM, No JVD, mass, thyromegaly, or carotid bruit present.  Cardiovascular: RRR, S1 normal, S2 normal, no MRG, pulses symmetric and intact bilaterally  Pulmonary/Chest: Decreased overall air movement with bibasilar crackles appreciated.  Abdominal: Soft. Protuberant abdomen that appears distended. Tenderness to palpation generalized, but noted mostly in the  epigastric region. No rebound or guarding present  Musculoskeletal: No joint deformities, erythema, or stiffness, ROM full and no nontender Ext: no edema and no cyanosis, pulses palpable bilaterally (DP and PT)  Hematology: no cervical, inginal, or axillary adenopathy.  Neurological: A&O x3, Strenght is normal and symmetric bilaterally, cranial nerve II-XII are grossly intact, no focal motor deficit, sensory intact to light touch bilaterally.  Skin: Warm, dry and intact. No rash, cyanosis, or clubbing.  Psychiatric: Normal mood and affect. speech and behavior is normal. Judgment and thought content normal. Cognition and memory are normal.      Data Review   Micro Results No results found for this or any previous visit (from the past 240 hour(s)).  Radiology Reports US Abdomen Limited Ruq  02/05/2016  CLINICAL DATA:  Transaminitis EXAM: US ABDOMEN LIMITED - RIGHT UPPER QUADRANT COMPARISON:  12/22/2015 FINDINGS: Gallbladder: Small subtly shadowing gallstones are now seen. There is no wall thickening or focal tenderness to suggest acute cholecystitis. Common bile duct: Diameter: 4 mm. Liver: Echogenic liver with poor acoustic transmission and pericholecystic sparing. Incidental 1 cm cyst in the right  liver. IMPRESSION: 1. Cholelithiasis without signs of cholecystitis. 2. Hepatic steatosis. Electronically Signed   By: Monte Fantasia M.D.   On: 02/05/2016 02:40     CBC  Recent Labs Lab 02/05/16 0119  WBC 9.1  HGB 13.5  HCT 41.4  PLT 201  MCV 89.6  MCH 29.2  MCHC 32.6  RDW 14.0    Chemistries   Recent Labs Lab 02/05/16 0119  NA 137  K 4.8  CL  101  CO2 26  GLUCOSE 254*  BUN 18  CREATININE 1.25*  CALCIUM 9.6  AST 339*  ALT 228*  ALKPHOS 96  BILITOT 3.0*   ------------------------------------------------------------------------------------------------------------------ estimated creatinine clearance is 72.3 mL/min (by C-G formula based on Cr of 1.25). ------------------------------------------------------------------------------------------------------------------ No results for input(s): HGBA1C in the last 72 hours. ------------------------------------------------------------------------------------------------------------------ No results for input(s): CHOL, HDL, LDLCALC, TRIG, CHOLHDL, LDLDIRECT in the last 72 hours. ------------------------------------------------------------------------------------------------------------------ No results for input(s): TSH, T4TOTAL, T3FREE, THYROIDAB in the last 72 hours.  Invalid input(s): FREET3 ------------------------------------------------------------------------------------------------------------------ No results for input(s): VITAMINB12, FOLATE, FERRITIN, TIBC, IRON, RETICCTPCT in the last 72 hours.  Coagulation profile No results for input(s): INR, PROTIME in the last 168 hours.  No results for input(s): DDIMER in the last 72 hours.  Cardiac Enzymes No results for input(s): CKMB, TROPONINI, MYOGLOBIN in the last 168 hours.  Invalid input(s): CK ------------------------------------------------------------------------------------------------------------------ Invalid input(s): POCBNP   CBG: No results for  input(s): GLUCAP in the last 168 hours.        Assessment/Plan Epigastric abdominal pain: Acute on chronic. Patient gives a six-month history of intermittent abdominal bloating and pain epigastrically. Ultrasound of the abdomen reveals cholelithiasis without signs of cholecystitis. Gallbladder duct appears normal in size.Patient's previous lab work one month ago was within normal limits. However, today AST and ALT were acutely elevated 3-9x upper limit of normal and total bilirubin elevated 3. - Admit to MedSurg bed - Check fractionated bilirubin - Morphine when necessary pain - IV fluids normal saline  - Consult gastroenterology called to Dr. Henrene Pastor patient to be placed on list to see in a.m.    Cholelithiasis: As seen above  Elevated liver enzymes: As seen above  Diabetes mellitus type 2 with hyperglycemia: Patient's blood glucose is elevated 254 on admission. - Held metformin and amaryl  - Advance diet as tolerated to Carb/ heart modified diet as tolerated - Continue home NPH dose 40 units twice a day - CBGs every before meals and at bedtime   Chronic kidney disease stage III: Stable. Baseline creatinine on average is around 1.3. Patient presents today for creatinine of 1.25. -Continue to monitor   History of SSS and Afib s/p PM - Continue Xarelto for now  Obstructive sleep apnea on CPAP  - respiratory to supply with CPAP while hospitalized  Jerrye Bushy  - Pharmacy substitution of Protonix for omeprazole  Code Status:   full Family Communication: bedside Disposition Plan: admit   Total time spent 55 minutes.Greater than 50% of this time was spent in counseling, explanation of diagnosis, planning of further management, and coordination of care  [facility] Pager [phone]  If 7PM-7AM, please contact night-coverage [email or web address] Password [facility] 02/05/2016, 3:32 AM

## 2016-02-05 NOTE — Consult Note (Signed)
                                                                           [facility] Consult: 10:52 AM 02/05/2016     Referring Provider: Dr Akula  Primary Care Physician:  HAMRICK,MAURA L, MD Primary Gastroenterologist:  Dr. Gessner    Reason for Consultation:  Abdominal pain and elevated LFTs.    HPI: Collen B Rister is a 70 y.o. male.  Problems include: IDDM (type 2).  MI 1995.  Previous cardiac stents with latest DES in 2008 but no longer on Plavix. 2D echo 05/2014: EF improved from 40s in 2008 to 60 to 65%, grade 1 diastolic dysfunction.  Morbid obesity: 279#, BMI 42.  OSA on CPAP.  S/p pacemaker for SSS.  PAF: ~ 01/05/16 switched from Coumadin to Xarelto.  LE venous stasis, previous stasis ulcers. O/A with chronic knee pain.   10/2015 EGD.  Dr Jacobs, for abd pain and elevated LFTs.  Non-specific pan gastritis.  + H Pylori, 1000 mg daily ASA.  Periampullary duodenal diverticulum.  04/2008 Esophagram: small HH.  Mild esophageal dysmotility.  12/2007 EGD. Dr. Gessner For dysphagia , reflux.  3 cm HH, erosive antral gastritis.  09/2006 Colonoscopy.  Dr Medoff.  For FOB + stool and screening.  15 mm pedunculated polyp at hepatic flexure. Path: inflammatory polyp with ulceration, no adenomatous or malignant features.  12/22/15 Ultrasound:  Fatty liver.  Calcified hepatic granulomas and small hepatic cysts.  Small non-obstructing bil renal stones. Calcified splenic granuloma.   10/21/2015 CT abd/pelvis with CM: stable 10 and 12 mm liver cysts.  Calcified liver granulomas.  Unremarkable GB and bile ducts.  Patent protal vein.  Splenic graulomata. Scattered sigmoid tics.  Surgically absent  Appendix.   At Thanksgiving 2016 had several hours of acute upper abdominal pain and intense bloating.  Labs with mild LFT elevation (see next paragraph below), CT then ultrasound as above.   After EGD  10/2015, he completed the Prevpac, continues on daily PPI (no PPI prior to that EGD). Takes 1 to 4 acetaminophen per day but only a few days a week at most.  No ETOH and never was a heavy drinker.  After EGD he started on Bentyl for possible IBS.  Had not had recurrence of sxs until this past Tuesday 3/14 when epigastric pain and bloating (felt like he could explode) lasted ~ 3 hours.  Recurred again yesterday and did not resolve so he came to ED.  "on the edge of nausea" but no vomiting.  + anorexia.  Stools normal for him: loose.  Urine amber/tea colored since 3/14.     Previously, 09/2015, transaminases in the 50s/90s, t bili 1.9, alk phos 164. Normalized to 13/16, 0.3, 59.  Now 339/228, t bili 4.4, alk phos 96.     + SOB he attributes to Xarelto started mid 12/2015.  No extremity swelling, no chest pain.  No significant weight increase or loss.     Pain improved with Morphine in ED.  Urologist started Uroxatral for BPH.  Reviewed in [facility] and can cause hepatotoxicity.  Contraindicated in pt's with hepatic impairment. Pt says it has not made significant improvement to his urgency/hesitancy.  Nocturia is   not bad.     Past Medical History  Diagnosis Date  . Hypertension   . Coronary artery disease     last cath 2008,  remote PCI of the LAD with Lcx/OM bifurcation stenting and proximal RCA stenting  . 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 Other dysphagia   . Osteoarthritis, knee   . Dyslipidemia   . Exogenous obesity     severe  . Hx of colonic polyps 09/2006    inflammatory polyp at hepatic flexure. not adenomatous or malignant.   . Nephrolithiasis   . Long term (current) use of anticoagulants   . OSA (obstructive sleep apnea)   . Chronic venous insufficiency     with prior venous stasis ulcers  . Diabetes mellitus, type II ([place])     AODM  . Statin intolerance     Hx of. Now tolerating Zetia & Livalo well.   . Weakness     In bilateral legs. Duplex performed 04/02/13   . DOE  (dyspnea on exertion)     TEE 07/10/13 - LV EF 40-45%  . History of nuclear stress test 01/02/13    OVERALL IMPRESSION: Low risk stress nuclear study. Fixed inferolateral defect, likely scar. No reversible ischemia. LV wall motion: Inferolateral hypokinesis. LVEF 47%. ([place])  . MI (myocardial infarction) ([place]) 1995  . 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 Jude Zephyr XL DR 5826, dual chamber, rate responsive. No arrhythmias recorded and he has an excellent threshold.  . SSS (sick sinus syndrome) ([place])   . PAF (paroxysmal atrial fibrillation) ([place])   . LBBB (left bundle branch block)     He has developed a native LBBB which was seen on his last visit of March 2014 (From OV note 07/03/13)   . Regional wall motion abnormality of heart     By TEE 07/10/13  . Chronic systolic dysfunction of left ventricle     EF 60 to 65% by 3/[id number], grade 1 diatolic dysfunction  . Left atrial dilatation     moderate by echo    Past Surgical History  Procedure Laterality Date  . Pacemaker insertion  08/28/08    St. Jude Zephyr XL DR 5826, dual chamber, rate responsive. No arrhythmias recorded and he has an excellent threshold.  . Coronary angioplasty with stent placement  1998 & 2008    Last cath in 2008, remote LAD stenting: Cx/OM bifurcation, proximal right coronary.   [name] Appendectomy  1962  . Small intestine surgery  1962    age 73    Prior to Admission medications   Medication Sig Start Date End Date Taking? Authorizing Provider  albuterol (PROVENTIL HFA;VENTOLIN HFA) 108 (90 BASE) MCG/ACT inhaler Inhale 1 puff into the lungs every 6 (six) hours as needed for wheezing or shortness of breath. Reported on 12/15/2015   Yes Historical Provider, MD  alfuzosin (UROXATRAL) 10 MG 24 hr tablet Take 10 mg by mouth daily with breakfast.   Yes Historical Provider, MD  dicyclomine (BENTYL) 10 MG capsule Take 1-2 capsules (10-20 mg total) by mouth 4 (four) times daily -  before meals and at bedtime. 01/20/16  Yes  Gatha Mayer, MD  glimepiride (AMARYL) 4 MG tablet Take 4 mg by mouth 2 (two) times daily.   Yes Historical Provider, MD  hydrochlorothiazide (HYDRODIURIL) 25 MG tablet Take 1 tablet (25 mg total) by mouth daily. 12/30/15  Yes Thompson Grayer, MD  HYDROcodone-acetaminophen (NORCO/VICODIN) 5-325 MG tablet Take 1 tablet by mouth 2 (two)  times daily as needed for moderate pain.   Yes Historical Provider, MD  insulin regular (NOVOLIN R,HUMULIN R) 100 units/mL injection Inject 40 Units into the skin 3 (three) times daily.    Yes Historical Provider, MD  lisinopril (PRINIVIL,ZESTRIL) 40 MG tablet Take 1 tablet (40 mg total) by mouth daily. 06/24/15  Yes Thompson Grayer, MD  metFORMIN (GLUCOPHAGE) 500 MG tablet Take 1,000 mg by mouth daily. With meal   Yes Historical Provider, MD  nitroGLYCERIN (NITROSTAT) 0.4 MG SL tablet Place 1 tablet (0.4 mg total) under the tongue every 5 (five) minutes as needed for chest pain. 01/12/16  Yes Thompson Grayer, MD  NOVOLIN N RELION 100 UNIT/ML injection Inject 40 Units into the skin 2 (two) times daily. Use as directecd 02/26/14  Yes Historical Provider, MD  omeprazole (PRILOSEC) 40 MG capsule Take 1 capsule (40 mg total) by mouth daily before breakfast. 10/24/15  Yes [name], MD  ondansetron (ZOFRAN) 4 MG tablet Take 4 mg by mouth every 8 (eight) hours as needed for nausea or vomiting.   Yes Historical Provider, MD  rivaroxaban (XARELTO) 20 MG TABS tablet Take 1 tablet (20 mg total) by mouth daily with supper. 01/05/16  Yes Thompson Grayer, MD  sotalol (BETAPACE) 120 MG tablet Take 1 tablet (120 mg total) by mouth 2 (two) times daily. 01/27/16  Yes Thompson Grayer, MD  traMADol-acetaminophen (ULTRACET) 37.5-325 MG per tablet Take 1 tablet by mouth daily.  11/05/13  Yes Historical Provider, MD    Scheduled Meds: . dicyclomine  10 mg Oral TID AC & HS  . hydrochlorothiazide  25 mg Oral Daily  . insulin aspart  0-20 Units Subcutaneous TID WC  . insulin aspart  0-5 Units Subcutaneous  QHS  . insulin NPH Human  40 Units Subcutaneous BID AC & HS  . lisinopril  40 mg Oral Daily  . pantoprazole  40 mg Oral Daily  . rivaroxaban  20 mg Oral Q supper  . sotalol  120 mg Oral BID   Infusions: . sodium chloride     PRN Meds: acetaminophen **OR** acetaminophen, HYDROcodone-acetaminophen, morphine injection, nitroGLYCERIN, ondansetron **OR** ondansetron (ZOFRAN) IV   Allergies as of 02/05/2016 - Review Complete 02/05/2016  Allergen Reaction Noted  . Chocolate Hives, Shortness Of Breath, and Swelling 05/28/2015  . Statins Other (See Comments) 01/02/2013  . Codeine  04/30/2008  . Oxytetracycline  04/30/2008    Family History  Problem Relation Age of Onset  . Hypertension Sister   . Heart disease Mother   . Arthritis Sister   . Epilepsy Brother   . Stroke Maternal Grandmother   . Lung cancer Maternal Grandfather   . Stroke Paternal Grandfather   . Colon polyps Sister     Social History   Social History  . Marital Status: Married    Spouse Name: N/A  . Number of Children: 1  . Years of Education: N/A   Occupational History  . [place] History Main Topics  . Smoking status: Former Smoker    Types: Cigarettes    Quit date: 11/22/1974  . Smokeless tobacco: Never Used  . Alcohol Use: No  . Drug Use: No  . Sexual Activity: Not on file   Other Topics Concern  . Not on file   Social History Narrative   Married   Works at [employer] and also as [employer] at the [place]: Constitutional:  Per HPI.  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  Had opthalmic exam in 2016.  No vision changes.  No oral ulcers  Pulm:  Per hpi.  DOE after he takes xarelto.  CV:  Per HPI.  Stable LE swelling not severe.  Uses compression stockings GU:  No hematuria, no frequency GI:  Dysphagia only with gritty type foods  (cornbread triggers).  Heme:  No unusual bleeding or bruising   Transfusions:  None ever Neuro:  No headaches, no peripheral tingling or numbness Derm:  No itching, no rash or sores.  Endocrine:  No sweats or chills.  No polyuria, polydypsia.  AM sugars ~ 200 to 225.  Later day and PM sugasr 100s to 180s.  Last A1c ~ 8.  Immunization:  Did not inquire.    PHYSICAL EXAM: Vital signs in last 24 hours: Filed Vitals:   02/05/16 0400 02/05/16 0647  BP: 110/54 118/50  Pulse: 70 75  Temp:  97.9 F (36.6 C)  Resp: 21 19   Wt Readings from Last 3 Encounters:  02/05/16 126.554 kg (279 lb)  01/05/16 126.463 kg (278 lb 12.8 oz)  12/15/15 125.646 kg (277 lb)    General: pleasant, overweight WM who is comfortable.  Does not look ill.  Head:  No asymmetry, trauma or swelling  Eyes:  Very slight icterus Ears:  Not HOH  Nose:  No discharge Mouth:  Clear, moist, no dental disease.  No sores or exudates Neck:  No JVD or masses.  No TMG Lungs:  Clear bil.  No cough or labored breathing.  Heart: RRR.  No MRG Abdomen:  Soft with upper muscular diastasis at midline.  No tenderness.  Liner firm ridge at region of long surgical scar at RUQ.  BS active.   GU:  Urine in urinal is amber colored. No scrotal edema Rectal: deferred   Musc/Skeltl: some arthritic deformity of knees but no joint erythema.  Extremities: mild, 1 + pitting edema in LE/feet.   Neurologic:  No limb weakness or tremor.  No asterixis or tremor.  Oriented x 3.  Excellent historian. Full alert.  Skin:  No telangectasia.  No jaundice Tattoos:  none Nodes:  No cervical adenopathy.    Psych:  Pleasant, calm, appropriate, cooperative.   Intake/Output from previous day:   Intake/Output this shift: Total I/O In: 240 [P.O.:240] Out: 375 [Urine:375]  LAB RESULTS:  Recent Labs  02/05/16 0119  WBC 9.1  HGB 13.5  HCT 41.4  PLT 201   BMET Lab Results  Component Value Date   NA 137 02/05/2016   NA 137 01/05/2016   NA 136  10/21/2015   K 4.8 02/05/2016   K 4.8 01/05/2016   K 4.0 10/21/2015   CL 101 02/05/2016   CL 102 01/05/2016   CL 97 10/21/2015   CO2 26 02/05/2016   CO2 26 01/05/2016   CO2 30 10/21/2015   GLUCOSE 254* 02/05/2016   GLUCOSE 225* 01/05/2016   GLUCOSE 258* 10/21/2015   BUN 18 02/05/2016   BUN 19 01/05/2016   BUN 17 10/21/2015   CREATININE 1.25* 02/05/2016   CREATININE 1.27* 01/05/2016   CREATININE 1.30 10/21/2015   CALCIUM 9.6 02/05/2016   CALCIUM 9.2 01/05/2016   CALCIUM 9.1 10/21/2015   LFT  Recent Labs  02/05/16 0119 02/05/16 0503  PROT 7.5  --   ALBUMIN 3.9  --  AST 339*  --   ALT 228*  --   ALKPHOS 96  --   BILITOT 3.0* 4.4*  BILIDIR  --  2.7*  IBILI  --  1.7*   PT/INR Lab Results  Component Value Date   INR 1.8 01/05/2016   INR 2 11/25/2015   INR 2.9* 11/18/2015   Hepatitis Panel No results for input(s): HEPBSAG, HCVAB, HEPAIGM, HEPBIGM in the last 72 hours. C-Diff No components found for: CDIFF Lipase     Component Value Date/Time   LIPASE 33 Feb 18, 2016 0119    Drugs of Abuse  No results found for: LABOPIA, COCAINSCRNUR, LABBENZ, AMPHETMU, THCU, LABBARB   RADIOLOGY STUDIES: US Abdomen Limited Ruq  02/18/2016  CLINICAL DATA:  Transaminitis EXAM: US ABDOMEN LIMITED - RIGHT UPPER QUADRANT COMPARISON:  12/22/2015 FINDINGS: Gallbladder: Small subtly shadowing gallstones are now seen. There is no wall thickening or focal tenderness to suggest acute cholecystitis. Common bile duct: Diameter: 4 mm. Liver: Echogenic liver with poor acoustic transmission and pericholecystic sparing. Incidental 1 cm cyst in the right liver. IMPRESSION: 1. Cholelithiasis without signs of cholecystitis. 2. Hepatic steatosis. Electronically Signed   By: Monte Fantasia M.D.   On: February 18, 2016 02:40    ENDOSCOPIC STUDIES: Per HPI  IMPRESSION:   *  Acute upper abdominal bloating and discomfort with DOE.  Acute elevation of LFTs.  Previous similar but not as prolonged or intense  sxs with milder LFT elevation 09/2015 had resolved.   Fatty liver, stable hepatic cysts and granulamas, but no biliary tree or GB pathology on imaging within last 3 to 4 months.  Periampullary diverticulum, gastritis and H Pylori noted on EGD 10/2015.  Using 1000 mg ASA at the time. Treated with Prevpac  And continues on daily Omeprazole 40.  ? Adverse drug reaction/DILI from his new med Uroxatral started 10 to 14 days ago. Or from the Xarelto started 4 weeks ago.    *  Chronic Coumadin for PAF. Previous cardiac stenting, including DES in 06/2013 Hx LV dysfunction in 71s in 06/2013, normalized on most recent echo of 05/2014.  Switched from Coumadin to Xarelto after 01/05/16 visit with EP Dr [name].  Per [facility]: Xarelto can cause hepatitis and ALT elevation as well.  C/o new onset DOE.      PLAN:     *  Stopped the Uroxatral.  Left Xarelto in place. Stopping Bentyl as it is an anticholinergic which may very well be leading to increased prostate sxs and do not think it is relieving his current sxs.    *  Obtain Echo to see if heart failure may be contributing to increased LFTs, DOE.  I ordered this.   *  CMET in AM.     Azucena Freed  02-18-2016, 10:52 AM Pager: [phone]  ________________________________________________________________________  Velora Heckler GI MD note:  I personally examined the patient, reviewed the data and agree with the assessment and plan described above.  Perhaps his lft elevation is med related.  Often biliary disease causes abd pain, elevated liver tests and no he is found to have gallstones on [place] this admission ([place] 2 months ago showed no stones).  Ideally MRI with MRCP would be a good next step however he has pacemaker in place and so cannot have an MRI.  Endoscopic ultrasound has very good sensitivity for CBD stnes and I recommended we proceed with that tomorrow.  I agree with stopping the uroxatrol and checking echo as well.  Will make him NPO after MN.  Owens Loffler, MD [facility] Pager [phone]

## 2016-02-06 ENCOUNTER — Observation Stay (HOSPITAL_COMMUNITY): Payer: BLUE CROSS/BLUE SHIELD | Admitting: Anesthesiology

## 2016-02-06 ENCOUNTER — Observation Stay (HOSPITAL_COMMUNITY): Payer: BLUE CROSS/BLUE SHIELD

## 2016-02-06 ENCOUNTER — Encounter (HOSPITAL_COMMUNITY)
Admission: EM | Disposition: A | Discharge status: Home / Self Care | Payer: Self-pay | Source: Home / Self Care | Attending: Family Medicine

## 2016-02-06 ENCOUNTER — Encounter (HOSPITAL_COMMUNITY): Payer: Self-pay

## 2016-02-06 DIAGNOSIS — I4891 Unspecified atrial fibrillation: Secondary | ICD-10-CM | POA: Diagnosis not present

## 2016-02-06 DIAGNOSIS — R1013 Epigastric pain: Secondary | ICD-10-CM | POA: Diagnosis not present

## 2016-02-06 HISTORY — PX: EUS: SHX5427

## 2016-02-06 LAB — GLUCOSE, CAPILLARY
GLUCOSE-CAPILLARY: 181 mg/dL — AB (ref 65–99)
GLUCOSE-CAPILLARY: 156 mg/dL — AB (ref 65–99)
GLUCOSE-CAPILLARY: 236 mg/dL — AB (ref 65–99)
Glucose-Capillary: 182 mg/dL — ABNORMAL HIGH (ref 65–99)

## 2016-02-06 LAB — COMPREHENSIVE METABOLIC PANEL
ALK PHOS: 117 U/L (ref 38–126)
ALT: 207 U/L — AB (ref 17–63)
AST: 92 U/L — AB (ref 15–41)
Albumin: 3.1 g/dL — ABNORMAL LOW (ref 3.5–5.0)
Anion gap: 11 (ref 5–15)
BILIRUBIN TOTAL: 5.7 mg/dL — AB (ref 0.3–1.2)
BUN: 19 mg/dL (ref 6–20)
CALCIUM: 8.6 mg/dL — AB (ref 8.9–10.3)
CHLORIDE: 104 mmol/L (ref 101–111)
CO2: 22 mmol/L (ref 22–32)
CREATININE: 1.47 mg/dL — AB (ref 0.61–1.24)
GFR, EST AFRICAN AMERICAN: 54 mL/min — AB (ref >60)
GFR, EST NON AFRICAN AMERICAN: 47 mL/min — AB (ref >60)
Glucose, Bld: 187 mg/dL — ABNORMAL HIGH (ref 65–99)
Potassium: 3.5 mmol/L (ref 3.5–5.1)
Sodium: 137 mmol/L (ref 135–145)
TOTAL PROTEIN: 6.4 g/dL — AB (ref 6.5–8.1)

## 2016-02-06 LAB — CBC
HCT: 35.6 % — ABNORMAL LOW (ref 39.0–52.0)
Hemoglobin: 11.6 g/dL — ABNORMAL LOW (ref 13.0–17.0)
MCH: 28.8 pg (ref 26.0–34.0)
MCHC: 32.6 g/dL (ref 30.0–36.0)
MCV: 88.3 fL (ref 78.0–100.0)
PLATELETS: 175 10*3/uL (ref 150–400)
RBC: 4.03 MIL/uL — AB (ref 4.22–5.81)
RDW: 14.2 % (ref 11.5–15.5)
WBC: 6.7 10*3/uL (ref 4.0–10.5)

## 2016-02-06 SURGERY — UPPER ENDOSCOPIC ULTRASOUND (EUS) RADIAL
Anesthesia: Monitor Anesthesia Care

## 2016-02-06 MED ORDER — BUTAMBEN-TETRACAINE-BENZOCAINE 2-2-14 % EX AERO
INHALATION_SPRAY | CUTANEOUS | Status: DC | PRN
  Administered 2016-02-06: 2 via TOPICAL

## 2016-02-06 MED ORDER — PROPOFOL 500 MG/50ML IV EMUL
INTRAVENOUS | Status: DC | PRN
  Administered 2016-02-06: 100 ug/kg/min via INTRAVENOUS

## 2016-02-06 MED ORDER — LACTATED RINGERS IV SOLN
INTRAVENOUS | Status: DC | PRN
  Administered 2016-02-06: 11:00:00 via INTRAVENOUS

## 2016-02-06 MED ORDER — CIPROFLOXACIN IN D5W 400 MG/200ML IV SOLN
400.0000 mg | Freq: Two times a day (BID) | INTRAVENOUS | Status: DC
  Administered 2016-02-06 – 2016-02-10 (×8): 400 mg via INTRAVENOUS
  Filled 2016-02-06 (×10): qty 200

## 2016-02-06 MED ORDER — SODIUM CHLORIDE 0.9 % IV SOLN
INTRAVENOUS | Status: DC
  Administered 2016-02-06 – 2016-02-09 (×6): via INTRAVENOUS

## 2016-02-06 MED ORDER — MORPHINE SULFATE (PF) 4 MG/ML IV SOLN
4.0000 mg | Freq: Once | INTRAVENOUS | Status: AC
  Administered 2016-02-06: 1 mg via INTRAVENOUS
  Administered 2016-02-06: 4 mg via INTRAVENOUS

## 2016-02-06 MED ORDER — PROPOFOL 10 MG/ML IV BOLUS
INTRAVENOUS | Status: DC | PRN
  Administered 2016-02-06: 15 mg via INTRAVENOUS

## 2016-02-06 MED ORDER — MORPHINE SULFATE (PF) 4 MG/ML IV SOLN
INTRAVENOUS | Status: AC
  Administered 2016-02-06: 1 mg via INTRAVENOUS
  Filled 2016-02-06: qty 2

## 2016-02-06 MED ORDER — LIDOCAINE HCL (CARDIAC) 20 MG/ML IV SOLN
INTRAVENOUS | Status: DC | PRN
  Administered 2016-02-06: 60 mg via INTRATRACHEAL

## 2016-02-06 MED ORDER — MORPHINE SULFATE (PF) 2 MG/ML IV SOLN
1.0000 mg | Freq: Once | INTRAVENOUS | Status: AC
Start: 2016-02-06 — End: 2016-02-06
  Administered 2016-02-06: 1 mg via INTRAVENOUS

## 2016-02-06 MED ORDER — TECHNETIUM TC 99M MEBROFENIN IV KIT: 5.0000 | PACK | Freq: Once | INTRAVENOUS | Status: DC | PRN

## 2016-02-06 MED ORDER — SODIUM CHLORIDE 0.9 % IV SOLN: INTRAVENOUS | Status: DC

## 2016-02-06 NOTE — Progress Notes (Signed)
Daily Rounding Note  02/06/2016, 8:35 AM    SUBJECTIVE:       Still with pressure/bloating/discomfort in upper abdomen.  No nausea this AM.  Also c/o itching on trunk and arms but urine more yellow and less amber.   OBJECTIVE:         Vital signs in last 24 hours:    Temp:  [98 F (36.7 C)-98.2 F (36.8 C)] 98 F (36.7 C) (03/17 0635) Pulse Rate:  [69-70] 70 (03/17 0635) Resp:  [16-18] 18 (03/17 0635) BP: (106-118)/(44-61) 118/61 mmHg (03/17 0635) SpO2:  [97 %-100 %] 100 % (03/17 0635) Last BM Date: 02/04/16 Filed Weights   02/05/16 0106  Weight: 126.554 kg (279 lb)   General: pleasant, comfortable, not ill looking    Heart: RRR Chest: clear bil.  No labored breathing Abdomen: soft, tender in upper abdomen: mild to moderate without guard or rebound.  BS present  Extremities: mild LE edema bil.  Neuro/Psych:  Alert, pleasant, calm, cooperative.   Intake/Output from previous day: 03/16 0701 - 03/17 0700 In: 1920 [P.O.:1320; I.V.:600] Out: 1275 [Urine:1275]  Intake/Output this shift:    Lab Results:  Recent Labs  02/05/16 0119 02/06/16 0530  WBC 9.1 6.7  HGB 13.5 11.6*  HCT 41.4 35.6*  PLT 201 175   BMET  Recent Labs  02/05/16 0119 02/06/16 0530  NA 137 137  K 4.8 3.5  CL 101 104  CO2 26 22  GLUCOSE 254* 187*  BUN 18 19  CREATININE 1.25* 1.47*  CALCIUM 9.6 8.6*   LFT  Recent Labs  02/05/16 0119 02/05/16 0503 02/06/16 0530  PROT 7.5  --  6.4*  ALBUMIN 3.9  --  3.1*  AST 339*  --  92*  ALT 228*  --  207*  ALKPHOS 96  --  117  BILITOT 3.0* 4.4* 5.7*  BILIDIR  --  2.7*  --   IBILI  --  1.7*  --    PT/INR No results for input(s): LABPROT, INR in the last 72 hours. Hepatitis Panel No results for input(s): HEPBSAG, HCVAB, HEPAIGM, HEPBIGM in the last 72 hours.  Studies/Results: US Abdomen Limited Ruq  02/05/2016  CLINICAL DATA:  Transaminitis EXAM: US ABDOMEN LIMITED - RIGHT  UPPER QUADRANT COMPARISON:  12/22/2015 FINDINGS: Gallbladder: Small subtly shadowing gallstones are now seen. There is no wall thickening or focal tenderness to suggest acute cholecystitis. Common bile duct: Diameter: 4 mm. Liver: Echogenic liver with poor acoustic transmission and pericholecystic sparing. Incidental 1 cm cyst in the right liver. IMPRESSION: 1. Cholelithiasis without signs of cholecystitis. 2. Hepatic steatosis. Electronically Signed   By: Monte Fantasia M.D.   On: 02/05/2016 02:40   Scheduled Meds: . hydrochlorothiazide  25 mg Oral Daily  . insulin aspart  0-20 Units Subcutaneous TID WC  . insulin aspart  0-5 Units Subcutaneous QHS  . insulin NPH Human  40 Units Subcutaneous BID AC & HS  . lisinopril  40 mg Oral Daily  . pantoprazole  40 mg Oral Daily  . rivaroxaban  20 mg Oral Q supper  . sotalol  120 mg Oral BID   Continuous Infusions: . sodium chloride     PRN Meds:.acetaminophen **OR** acetaminophen, HYDROcodone-acetaminophen, morphine injection, nitroGLYCERIN, ondansetron **OR** ondansetron (ZOFRAN) IV   ASSESMENT:   * Acute upper abdominal bloating and discomfort with DOE. Acute elevation of LFTs. Previous similar but not as prolonged or intense sxs with milder LFT elevation 09/2015 had  resolved.  Fatty liver, stable hepatic cysts and granulamas, but no biliary tree or GB pathology on imaging within last 3 to 4 months.  Periampullary diverticulum, gastritis and H Pylori noted on EGD 10/2015. Using 1000 mg ASA at the time. Treated with Prevpac And continues on daily Omeprazole 40.  ? Adverse drug reaction/DILI from his new med Uroxatral started 10 to 14 days ago. Or from the Xarelto started 4 weeks ago.  t bili rising, transaminases improved.  SXS persist.    * Chronic AC for PAF. Previous cardiac stenting, including DES in 06/2013 Hx LV dysfunction in 72s in 06/2013, normalized on most recent echo of 05/2014.  Switched from Coumadin to Xarelto after  01/05/16 visit with EP Dr [name].  Per [facility]: Xarelto can cause hepatitis and ALT elevation as well.  C/o new onset DOE.  Echo ordered, not completed    PLAN   *  EUS today.  Assess for choledocholithiasis or other source for sxs and elevated LFTs.     Charles [name]  02/06/2016, 8:35 AM Pager: [phone]

## 2016-02-06 NOTE — Op Note (Signed)
[facility] Patient Name: Charles [name] Procedure Date : 02/06/2016 MRN: [id number] Attending MD: [name] , MD Date of Birth: 10-18-1946 CSN: [id number] Age: 70 Admit Type: Inpatient Procedure:                Upper EUS Indications:              Elevated liver enzymes Providers:                [name], MD, Kingsley Plan, RN,                            Oluwafemi Dalton, Technician Referring MD:              Medicines:                Monitored Anesthesia Care Complications:            No immediate complications. Estimated blood loss:                            None. Estimated Blood Loss:     Estimated blood loss: none. Procedure:                Pre-Anesthesia Assessment:                           - Prior to the procedure, a History and Physical                            was performed, and patient medications and                            allergies were reviewed. The patient's tolerance of                            previous anesthesia was also reviewed. The risks                            and benefits of the procedure and the sedation                            options and risks were discussed with the patient.                            All questions were answered, and informed consent                            was obtained. Prior Anticoagulants: The patient has                            taken no previous anticoagulant or antiplatelet                            agents. ASA Grade Assessment: II - A patient with                            mild  systemic disease. After reviewing the risks                            and benefits, the patient was deemed in                            satisfactory condition to undergo the procedure.                           After obtaining informed consent, the endoscope was                            passed under direct vision. Throughout the                            procedure, the patient's blood pressure, pulse, and                             oxygen saturations were monitored continuously. The                            [id number] scope was introduced through                            the mouth, and advanced to the second part of                            duodenum. The upper EUS was accomplished without                            difficulty. The patient tolerated the procedure                            well. Scope In: Scope Out: Findings:      EGD findings:      1. Normal UGI tract      EUS findings:      1. There were several small, round, hyperechoic, shadowing stones in the       GB. The gallbladder wall was slightly thick appearing (2-17mm) as well.      2. CBD was non-dilated (5.5mm), contained no clear stones, did have       slightly thickened walls (1-72mm)      3. Pancreatic parenchyma was normal; no masses or signs of chronic       pancreatitis      4. Main pancreatic duct was normal; non-dilated      5. No peripancreatic adenopathy      6. Limited views of liver, spleen, portal and splenic vessels were all       normal.      Endosonographic Finding : Impression:               EGD findings:                           1. Normal UGI tract  EUS findings:                           1. There were several small, round, hyperechoic,                            shadowing stones in the GB. The gallbladder wall                            was slightly thick appearing (2-52mm) as well.                           2. CBD was non-dilated (5.32mm), contained no clear                            stones, did have slightly thickened walls (1-70mm)                           3. Pancreatic parenchyma was normal; no masses or                            signs of chronic pancreatitis                           4. Main pancreatic duct was normal; non-dilated                           5. No peripancreatic adenopathy                           6. Limited views of liver, spleen, portal  and                            splenic vessels were all normal. Moderate Sedation:      none Recommendation:           - Return patient to hospital ward for ongoing care.                           - I'm suspicious for symptomatic gallstone disease                            (Gallbladder and CBD walls appear thickened and                            there are clear, shadowing stones in the GB). Does                            not appear to have biliary obstruction (CBD                            non-dilated and contains no obvious stones) but                            LFTs elevated in typical 'obstructive' pattern'.  Will arrange HIDA scan to check for ? acute                            cholecystitis and start empiric abx for now. Procedure Code(s):        --- Professional ---                           [phone], Esophagogastroduodenoscopy, flexible,                            transoral; with endoscopic ultrasound examination                            limited to the esophagus, stomach or duodenum, and                            adjacent structures Diagnosis Code(s):        --- Professional ---                           K80.20, Calculus of gallbladder without                            cholecystitis without obstruction                           R74.8, Abnormal levels of other serum enzymes CPT copyright 2016 American Medical Association. All rights reserved. The codes documented in this report are preliminary and upon coder review may  be revised to meet current compliance requirements. [name], MD [name], MD 02/06/2016 12:25:33 PM Number of Addenda: 0

## 2016-02-06 NOTE — Interval H&P Note (Signed)
History and Physical Interval Note:  02/06/2016 11:29 AM  Charles [name]  has presented today for surgery, with the diagnosis of abd pain, elevated liver tests  The various methods of treatment have been discussed with the patient and family. After consideration of risks, benefits and other options for treatment, the patient has consented to  Procedure(s): UPPER ENDOSCOPIC ULTRASOUND (EUS) RADIAL (N/A) as a surgical intervention .  The patient's history has been reviewed, patient examined, no change in status, stable for surgery.  I have reviewed the patient's chart and labs.  Questions were answered to the patient's satisfaction.     [name]

## 2016-02-06 NOTE — Transfer of Care (Signed)
Immediate Anesthesia Transfer of Care Note  Patient: Charles [name]  Procedure(s) Performed: Procedure(s): UPPER ENDOSCOPIC ULTRASOUND (EUS) RADIAL (N/A)  Patient Location: Endoscopy Unit  Anesthesia Type:MAC  Level of Consciousness: awake, alert  and oriented  Airway & Oxygen Therapy: Patient Spontanous Breathing and Patient connected to nasal cannula oxygen  Post-op Assessment: Report given to RN, Post -op Vital signs reviewed and stable and Patient moving all extremities X 4  Post vital signs: Reviewed and stable  Last Vitals:  Filed Vitals:   02/06/16 0942 02/06/16 1036  BP: 132/72 156/72  Pulse:  71  Temp:  36.6 C  Resp:  19    Complications: No apparent anesthesia complications

## 2016-02-06 NOTE — H&P (View-Only) (Signed)
[facility] Consult: 10:52 AM 02/05/2016     Referring Provider: Dr Karleen Hampshire  Primary Care Physician:  [name], MD Primary Gastroenterologist:  Dr. Carlean Purl    Reason for Consultation:  Abdominal pain and elevated LFTs.    HPI: Charles [name] is a 70 y.o. male.  Problems include: IDDM (type 2).  MI 1995.  Previous cardiac stents with latest DES in 2008 but no longer on Plavix. 2D echo 05/2014: EF improved from 40s in 2008 to 60 to 73%, grade 1 diastolic dysfunction.  Morbid obesity: 279#, BMI 42.  OSA on CPAP.  S/p pacemaker for SSS.  PAF: ~ 01/05/16 switched from Coumadin to Xarelto.  LE venous stasis, previous stasis ulcers. O/A with chronic knee pain.   10/2015 EGD.  Dr Ardis Hughs, for abd pain and elevated LFTs.  Non-specific pan gastritis.  + H Pylori, 1000 mg daily ASA.  Periampullary duodenal diverticulum.  04/2008 Esophagram: small HH.  Mild esophageal dysmotility.  12/2007 EGD. Dr. Carlean Purl For dysphagia , reflux.  3 cm HH, erosive antral gastritis.  09/2006 Colonoscopy.  Dr Earlean Shawl.  For FOB + stool and screening.  15 mm pedunculated polyp at hepatic flexure. Path: inflammatory polyp with ulceration, no adenomatous or malignant features.  12/22/15 Ultrasound:  Fatty liver.  Calcified hepatic granulomas and small hepatic cysts.  Small non-obstructing bil renal stones. Calcified splenic granuloma.   10/21/2015 CT abd/pelvis with CM: stable 10 and 12 mm liver cysts.  Calcified liver granulomas.  Unremarkable GB and bile ducts.  Patent protal vein.  Splenic graulomata. Scattered sigmoid tics.  Surgically absent  Appendix.   At Thanksgiving 2016 had several hours of acute upper abdominal pain and intense bloating.  Labs with mild LFT elevation (see next paragraph below), CT then ultrasound as above.   After EGD  10/2015, he completed the Prevpac, continues on daily PPI (no PPI prior to that EGD). Takes 1 to 4 acetaminophen per day but only a few days a week at most.  No ETOH and never was a heavy drinker.  After EGD he started on Bentyl for possible IBS.  Had not had recurrence of sxs until this past Tuesday 3/14 when epigastric pain and bloating (felt like he could explode) lasted ~ 3 hours.  Recurred again yesterday and did not resolve so he came to ED.  "on the edge of nausea" but no vomiting.  + anorexia.  Stools normal for him: loose.  Urine amber/tea colored since 3/14.     Previously, 09/2015, transaminases in the 50s/90s, t bili 1.9, alk phos 164. Normalized to 13/16, 0.3, 59.  Now 339/228, t bili 4.4, alk phos 96.     + SOB he attributes to Xarelto started mid 12/2015.  No extremity swelling, no chest pain.  No significant weight increase or loss.     Pain improved with Morphine in ED.  Urologist started Uroxatral for BPH.  Reviewed in [place] and can cause hepatotoxicity.  Contraindicated in pt's with hepatic impairment. Pt says it has not made significant improvement to his urgency/hesitancy.  Nocturia is  not bad.     Past Medical History  Diagnosis Date  . Hypertension   . Coronary artery disease     last cath 2008,  remote PCI of the LAD with Lcx/OM bifurcation stenting and proximal RCA stenting  . 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 Other dysphagia   . Osteoarthritis, knee   . Dyslipidemia   . Exogenous obesity     severe  . Hx of colonic polyps 09/2006    inflammatory polyp at hepatic flexure. not adenomatous or malignant.   . Nephrolithiasis   . Long term (current) use of anticoagulants   . OSA (obstructive sleep apnea)   . Chronic venous insufficiency     with prior venous stasis ulcers  . Diabetes mellitus, type II ([place])     AODM  . Statin intolerance     Hx of. Now tolerating Zetia & Livalo well.   . Weakness     In bilateral legs. Duplex performed 04/02/13   . DOE  (dyspnea on exertion)     TEE 07/10/13 - LV EF 40-45%  . History of nuclear stress test 01/02/13    OVERALL IMPRESSION: Low risk stress nuclear study. Fixed inferolateral defect, likely scar. No reversible ischemia. LV wall motion: Inferolateral hypokinesis. LVEF 47%. ([place])  . MI (myocardial infarction) ([place]) 1995  . 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 Jude Zephyr XL DR 5826, dual chamber, rate responsive. No arrhythmias recorded and he has an excellent threshold.  . SSS (sick sinus syndrome) ([place])   . PAF (paroxysmal atrial fibrillation) ([place])   . LBBB (left bundle branch block)     He has developed a native LBBB which was seen on his last visit of March 2014 (From OV note 07/03/13)   . Regional wall motion abnormality of heart     By TEE 07/10/13  . Chronic systolic dysfunction of left ventricle     EF 60 to 65% by 3/4917HXTA, grade 1 diatolic dysfunction  . Left atrial dilatation     moderate by echo    Past Surgical History  Procedure Laterality Date  . Pacemaker insertion  08/28/08    St. Jude Zephyr XL DR 5826, dual chamber, rate responsive. No arrhythmias recorded and he has an excellent threshold.  . Coronary angioplasty with stent placement  1998 & 2008    Last cath in 2008, remote LAD stenting: Cx/OM bifurcation, proximal right coronary.   [name] Appendectomy  1962  . Small intestine surgery  1962    age 73    Prior to Admission medications   Medication Sig Start Date End Date Taking? Authorizing Provider  albuterol (PROVENTIL HFA;VENTOLIN HFA) 108 (90 BASE) MCG/ACT inhaler Inhale 1 puff into the lungs every 6 (six) hours as needed for wheezing or shortness of breath. Reported on 12/15/2015   Yes Historical Provider, MD  alfuzosin (UROXATRAL) 10 MG 24 hr tablet Take 10 mg by mouth daily with breakfast.   Yes Historical Provider, MD  dicyclomine (BENTYL) 10 MG capsule Take 1-2 capsules (10-20 mg total) by mouth 4 (four) times daily -  before meals and at bedtime. 01/20/16  Yes  Gatha Mayer, MD  glimepiride (AMARYL) 4 MG tablet Take 4 mg by mouth 2 (two) times daily.   Yes Historical Provider, MD  hydrochlorothiazide (HYDRODIURIL) 25 MG tablet Take 1 tablet (25 mg total) by mouth daily. 12/30/15  Yes Thompson Grayer, MD  HYDROcodone-acetaminophen (NORCO/VICODIN) 5-325 MG tablet Take 1 tablet by mouth 2 (two)  times daily as needed for moderate pain.   Yes Historical Provider, MD  insulin regular (NOVOLIN R,HUMULIN R) 100 units/mL injection Inject 40 Units into the skin 3 (three) times daily.    Yes Historical Provider, MD  lisinopril (PRINIVIL,ZESTRIL) 40 MG tablet Take 1 tablet (40 mg total) by mouth daily. 06/24/15  Yes Thompson Grayer, MD  metFORMIN (GLUCOPHAGE) 500 MG tablet Take 1,000 mg by mouth daily. With meal   Yes Historical Provider, MD  nitroGLYCERIN (NITROSTAT) 0.4 MG SL tablet Place 1 tablet (0.4 mg total) under the tongue every 5 (five) minutes as needed for chest pain. 01/12/16  Yes Thompson Grayer, MD  NOVOLIN N RELION 100 UNIT/ML injection Inject 40 Units into the skin 2 (two) times daily. Use as directecd 02/26/14  Yes Historical Provider, MD  omeprazole (PRILOSEC) 40 MG capsule Take 1 capsule (40 mg total) by mouth daily before breakfast. 10/24/15  Yes [name], MD  ondansetron (ZOFRAN) 4 MG tablet Take 4 mg by mouth every 8 (eight) hours as needed for nausea or vomiting.   Yes Historical Provider, MD  rivaroxaban (XARELTO) 20 MG TABS tablet Take 1 tablet (20 mg total) by mouth daily with supper. 01/05/16  Yes Thompson Grayer, MD  sotalol (BETAPACE) 120 MG tablet Take 1 tablet (120 mg total) by mouth 2 (two) times daily. 01/27/16  Yes Thompson Grayer, MD  traMADol-acetaminophen (ULTRACET) 37.5-325 MG per tablet Take 1 tablet by mouth daily.  11/05/13  Yes Historical Provider, MD    Scheduled Meds: . dicyclomine  10 mg Oral TID AC & HS  . hydrochlorothiazide  25 mg Oral Daily  . insulin aspart  0-20 Units Subcutaneous TID WC  . insulin aspart  0-5 Units Subcutaneous  QHS  . insulin NPH Human  40 Units Subcutaneous BID AC & HS  . lisinopril  40 mg Oral Daily  . pantoprazole  40 mg Oral Daily  . rivaroxaban  20 mg Oral Q supper  . sotalol  120 mg Oral BID   Infusions: . sodium chloride     PRN Meds: acetaminophen **OR** acetaminophen, HYDROcodone-acetaminophen, morphine injection, nitroGLYCERIN, ondansetron **OR** ondansetron (ZOFRAN) IV   Allergies as of 02/05/2016 - Review Complete 02/05/2016  Allergen Reaction Noted  . Chocolate Hives, Shortness Of Breath, and Swelling 05/28/2015  . Statins Other (See Comments) 01/02/2013  . Codeine  04/30/2008  . Oxytetracycline  04/30/2008    Family History  Problem Relation Age of Onset  . Hypertension Sister   . Heart disease Mother   . Arthritis Sister   . Epilepsy Brother   . Stroke Maternal Grandmother   . Lung cancer Maternal Grandfather   . Stroke Paternal Grandfather   . Colon polyps Sister     Social History   Social History  . Marital Status: Married    Spouse Name: N/A  . Number of Children: 1  . Years of Education: N/A   Occupational History  . [place] History Main Topics  . Smoking status: Former Smoker    Types: Cigarettes    Quit date: 11/22/1974  . Smokeless tobacco: Never Used  . Alcohol Use: No  . Drug Use: No  . Sexual Activity: Not on file   Other Topics Concern  . Not on file   Social History Narrative   Married   Works at [employer] and also as [employer] at the [place]: Constitutional:  Per HPI.  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  Had opthalmic exam in 2016.  No vision changes.  No oral ulcers  Pulm:  Per hpi.  DOE after he takes xarelto.  CV:  Per HPI.  Stable LE swelling not severe.  Uses compression stockings GU:  No hematuria, no frequency GI:  Dysphagia only with gritty type foods  (cornbread triggers).  Heme:  No unusual bleeding or bruising   Transfusions:  None ever Neuro:  No headaches, no peripheral tingling or numbness Derm:  No itching, no rash or sores.  Endocrine:  No sweats or chills.  No polyuria, polydypsia.  AM sugars ~ 200 to 225.  Later day and PM sugasr 100s to 180s.  Last A1c ~ 8.  Immunization:  Did not inquire.    PHYSICAL EXAM: Vital signs in last 24 hours: Filed Vitals:   02/05/16 0400 02/05/16 0647  BP: 110/54 118/50  Pulse: 70 75  Temp:  97.9 F (36.6 C)  Resp: 21 19   Wt Readings from Last 3 Encounters:  02/05/16 126.554 kg (279 lb)  01/05/16 126.463 kg (278 lb 12.8 oz)  12/15/15 125.646 kg (277 lb)    General: pleasant, overweight WM who is comfortable.  Does not look ill.  Head:  No asymmetry, trauma or swelling  Eyes:  Very slight icterus Ears:  Not HOH  Nose:  No discharge Mouth:  Clear, moist, no dental disease.  No sores or exudates Neck:  No JVD or masses.  No TMG Lungs:  Clear bil.  No cough or labored breathing.  Heart: RRR.  No MRG Abdomen:  Soft with upper muscular diastasis at midline.  No tenderness.  Liner firm ridge at region of long surgical scar at RUQ.  BS active.   GU:  Urine in urinal is amber colored. No scrotal edema Rectal: deferred   Musc/Skeltl: some arthritic deformity of knees but no joint erythema.  Extremities: mild, 1 + pitting edema in LE/feet.   Neurologic:  No limb weakness or tremor.  No asterixis or tremor.  Oriented x 3.  Excellent historian. Full alert.  Skin:  No telangectasia.  No jaundice Tattoos:  none Nodes:  No cervical adenopathy.    Psych:  Pleasant, calm, appropriate, cooperative.   Intake/Output from previous day:   Intake/Output this shift: Total I/O In: 240 [P.O.:240] Out: 375 [Urine:375]  LAB RESULTS:  Recent Labs  02/05/16 0119  WBC 9.1  HGB 13.5  HCT 41.4  PLT 201   BMET Lab Results  Component Value Date   NA 137 02/05/2016   NA 137 01/05/2016   NA 136  10/21/2015   K 4.8 02/05/2016   K 4.8 01/05/2016   K 4.0 10/21/2015   CL 101 02/05/2016   CL 102 01/05/2016   CL 97 10/21/2015   CO2 26 02/05/2016   CO2 26 01/05/2016   CO2 30 10/21/2015   GLUCOSE 254* 02/05/2016   GLUCOSE 225* 01/05/2016   GLUCOSE 258* 10/21/2015   BUN 18 02/05/2016   BUN 19 01/05/2016   BUN 17 10/21/2015   CREATININE 1.25* 02/05/2016   CREATININE 1.27* 01/05/2016   CREATININE 1.30 10/21/2015   CALCIUM 9.6 02/05/2016   CALCIUM 9.2 01/05/2016   CALCIUM 9.1 10/21/2015   LFT  Recent Labs  02/05/16 0119 02/05/16 0503  PROT 7.5  --   ALBUMIN 3.9  --  AST 339*  --   ALT 228*  --   ALKPHOS 96  --   BILITOT 3.0* 4.4*  BILIDIR  --  2.7*  IBILI  --  1.7*   PT/INR Lab Results  Component Value Date   INR 1.8 01/05/2016   INR 2 11/25/2015   INR 2.9* 11/18/2015   Hepatitis Panel No results for input(s): HEPBSAG, HCVAB, HEPAIGM, HEPBIGM in the last 72 hours. C-Diff No components found for: CDIFF Lipase     Component Value Date/Time   LIPASE 33 Feb 18, 2016 0119    Drugs of Abuse  No results found for: LABOPIA, COCAINSCRNUR, LABBENZ, AMPHETMU, THCU, LABBARB   RADIOLOGY STUDIES: US Abdomen Limited Ruq  02/18/2016  CLINICAL DATA:  Transaminitis EXAM: US ABDOMEN LIMITED - RIGHT UPPER QUADRANT COMPARISON:  12/22/2015 FINDINGS: Gallbladder: Small subtly shadowing gallstones are now seen. There is no wall thickening or focal tenderness to suggest acute cholecystitis. Common bile duct: Diameter: 4 mm. Liver: Echogenic liver with poor acoustic transmission and pericholecystic sparing. Incidental 1 cm cyst in the right liver. IMPRESSION: 1. Cholelithiasis without signs of cholecystitis. 2. Hepatic steatosis. Electronically Signed   By: Monte Fantasia M.D.   On: February 18, 2016 02:40    ENDOSCOPIC STUDIES: Per HPI  IMPRESSION:   *  Acute upper abdominal bloating and discomfort with DOE.  Acute elevation of LFTs.  Previous similar but not as prolonged or intense  sxs with milder LFT elevation 09/2015 had resolved.   Fatty liver, stable hepatic cysts and granulamas, but no biliary tree or GB pathology on imaging within last 3 to 4 months.  Periampullary diverticulum, gastritis and H Pylori noted on EGD 10/2015.  Using 1000 mg ASA at the time. Treated with Prevpac  And continues on daily Omeprazole 40.  ? Adverse drug reaction/DILI from his new med Uroxatral started 10 to 14 days ago. Or from the Xarelto started 4 weeks ago.    *  Chronic Coumadin for PAF. Previous cardiac stenting, including DES in 06/2013 Hx LV dysfunction in 71s in 06/2013, normalized on most recent echo of 05/2014.  Switched from Coumadin to Xarelto after 01/05/16 visit with EP Dr [name].  Per [facility]: Xarelto can cause hepatitis and ALT elevation as well.  C/o new onset DOE.      PLAN:     *  Stopped the Uroxatral.  Left Xarelto in place. Stopping Bentyl as it is an anticholinergic which may very well be leading to increased prostate sxs and do not think it is relieving his current sxs.    *  Obtain Echo to see if heart failure may be contributing to increased LFTs, DOE.  I ordered this.   *  CMET in AM.     Azucena Freed  02-18-2016, 10:52 AM Pager: [phone]  ________________________________________________________________________  Velora Heckler GI MD note:  I personally examined the patient, reviewed the data and agree with the assessment and plan described above.  Perhaps his lft elevation is med related.  Often biliary disease causes abd pain, elevated liver tests and no he is found to have gallstones on [place] this admission ([place] 2 months ago showed no stones).  Ideally MRI with MRCP would be a good next step however he has pacemaker in place and so cannot have an MRI.  Endoscopic ultrasound has very good sensitivity for CBD stnes and I recommended we proceed with that tomorrow.  I agree with stopping the uroxatrol and checking echo as well.  Will make him NPO after MN.  Owens Loffler, MD [facility] Pager [phone]

## 2016-02-06 NOTE — Progress Notes (Signed)
[facility] PROGRESS NOTE  Charles KEHM [id number] DOB: 09-14-1946 DOA: 02/05/2016 PCP: [name], MD Interim summary: 70 year old male admitted for abdominal pain, intermittent since last November, in addition he was found to have elevate dliver function panel. He was admitted for the evaluation of the above. GI consulted and he underwent endoscopic ultrasound showed normal UGI tract, but there were several small shadowing stone in the gall bladder. There some GB thickening and CBD wall thickening. He was started on IV antibiotics.  HIDA scan was negative for acute cholecystitis. Liver function tests improving.   Assessment/Plan: 1. Intermittent epigastric pain of unclear etiology:? Possibly from GB stones.  Underwent EGD and endoscopic ultrasound and see the results above.  HIDA scan negative.  Liver function improving.  Further management as per GI.    2. STAGE 3 CKD: Stopped HCTZ, and lisinopril, probably from pre renal. Started him on IV fluids and repeat renal parameters in am.    Hypertension: Controlled.   Diabetes mellitus: CBG (last 3)   Recent Labs  02/06/16 0724 02/06/16 1335 02/06/16 1822  GLUCAP 181* 182* 156*    Resume SSI.  Holding oral hypoglycemics.    Atrial fib s/p PPM Rate controlled and on xarelto.    OSA on CPAP at night.   Code Status: full code.  Family Communication: none at bedside Disposition Plan: possibly home in 1 to 2 days, when lfts improve.    Consultants:  GI  Procedures:  Endoscopic Ultrasound  HIDA SCAN  Antibiotics:  cipro started on 3/17  HPI/Subjective: Denies any abdominal pain  Objective: Filed Vitals:   02/06/16 1230 02/06/16 1240  BP: 134/55 130/68  Pulse: 70 70  Temp:    Resp: 17 14    Intake/Output Summary (Last 24 hours) at 02/06/16 1850 Last data filed at 02/06/16 1300  Gross per 24 hour  Intake     30 ml  Output    677 ml  Net   -647 ml   Filed Weights   02/05/16 0106   Weight: 126.554 kg (279 lb)    Exam:   General:  Alert comfortable  Cardiovascular: s1s2  Respiratory: ctab, no wheezing or rhonchi  Abdomen: soft non tender non distended bowel sounds heard  Musculoskeletal: trace pedal edema  Data Reviewed: Basic Metabolic Panel:  Recent Labs Lab 02/05/16 0119 02/06/16 0530  NA 137 137  K 4.8 3.5  CL 101 104  CO2 26 22  GLUCOSE 254* 187*  BUN 18 19  CREATININE 1.25* 1.47*  CALCIUM 9.6 8.6*   Liver Function Tests:  Recent Labs Lab 02/05/16 0119 02/05/16 0503 02/06/16 0530  AST 339*  --  92*  ALT 228*  --  207*  ALKPHOS 96  --  117  BILITOT 3.0* 4.4* 5.7*  PROT 7.5  --  6.4*  ALBUMIN 3.9  --  3.1*    Recent Labs Lab 02/05/16 0119  LIPASE 33   No results for input(s): AMMONIA in the last 168 hours. CBC:  Recent Labs Lab 02/05/16 0119 02/06/16 0530  WBC 9.1 6.7  HGB 13.5 11.6*  HCT 41.4 35.6*  MCV 89.6 88.3  PLT 201 175   Cardiac Enzymes: No results for input(s): CKTOTAL, CKMB, CKMBINDEX, TROPONINI in the last 168 hours. BNP (last 3 results) No results for input(s): BNP in the last 8760 hours.  ProBNP (last 3 results)  Recent Labs  10/21/15 0932  PROBNP 364.0*    CBG:  Recent Labs Lab 02/05/16 1622 02/05/16  2231 02/06/16 0724 02/06/16 1335 02/06/16 1822  GLUCAP 150* 145* 181* 182* 156*    No results found for this or any previous visit (from the past 240 hour(s)).   Studies: Nm Hepatobiliary Including Gb  02/06/2016  CLINICAL DATA:  Upper abdominal pain and bloating with elevated LFTs EXAM: NUCLEAR MEDICINE HEPATOBILIARY IMAGING TECHNIQUE: Sequential images of the abdomen were obtained out to 60 minutes following intravenous administration of radiopharmaceutical. RADIOPHARMACEUTICALS:  Five mCi Tc-83m  Choletec IV COMPARISON:  02/05/2016 FINDINGS: There is adequate uptake of radioactive tracer throughout the liver. The biliary system is not well visualized although some minimal activity  passes into the small bowel. Nonvisualization of the gallbladder is noted despite treatment with 5 mg of morphine. IMPRESSION: Normal uptake of biliary tracer with only minimal excretion into the biliary tree. Nonvisualization of the gallbladder is noted despite morphine therapy. Given the poor opacification of the biliary tree this is equivocal for cholecystitis although suggests a possible cholestasis pattern. Electronically Signed   By: Inez Catalina M.D.   On: 02/06/2016 18:25   US Abdomen Limited Ruq  02/05/2016  CLINICAL DATA:  Transaminitis EXAM: US ABDOMEN LIMITED - RIGHT UPPER QUADRANT COMPARISON:  12/22/2015 FINDINGS: Gallbladder: Small subtly shadowing gallstones are now seen. There is no wall thickening or focal tenderness to suggest acute cholecystitis. Common bile duct: Diameter: 4 mm. Liver: Echogenic liver with poor acoustic transmission and pericholecystic sparing. Incidental 1 cm cyst in the right liver. IMPRESSION: 1. Cholelithiasis without signs of cholecystitis. 2. Hepatic steatosis. Electronically Signed   By: Monte Fantasia M.D.   On: 02/05/2016 02:40    Scheduled Meds: . ciprofloxacin  400 mg Intravenous Q12H  . insulin aspart  0-20 Units Subcutaneous TID WC  . insulin aspart  0-5 Units Subcutaneous QHS  . insulin NPH Human  40 Units Subcutaneous BID AC & HS  . pantoprazole  40 mg Oral Daily  . rivaroxaban  20 mg Oral Q supper  . sotalol  120 mg Oral BID   Continuous Infusions: . sodium chloride    . sodium chloride    . sodium chloride 75 mL/hr at 02/06/16 1834    Principal Problem:   Epigastric abdominal pain Active Problems:   Atrial fibrillation (HCC)   Long term (current) use of anticoagulants   OSA (obstructive sleep apnea)   Cholelithiasis   GERD (gastroesophageal reflux disease)   Diabetes mellitus type 2 in obese ([place])    Time spent: 30 minutes.     [facility] Pager [phone] . If 7PM-7AM, please contact night-coverage at  [email or web address], password [facility] 02/06/2016, 6:50 PM

## 2016-02-06 NOTE — Anesthesia Postprocedure Evaluation (Signed)
Anesthesia Post Note  Patient: Charles [name]  Procedure(s) Performed: Procedure(s) (LRB): UPPER ENDOSCOPIC ULTRASOUND (EUS) RADIAL (N/A)  Patient location during evaluation: PACU Anesthesia Type: MAC Level of consciousness: awake and alert Pain management: pain level controlled Vital Signs Assessment: post-procedure vital signs reviewed and stable Respiratory status: spontaneous breathing Cardiovascular status: stable Anesthetic complications: no    Last Vitals:  Filed Vitals:   02/06/16 1230 02/06/16 1240  BP: 134/55 130/68  Pulse: 70 70  Temp:    Resp: 17 14    Last Pain:  Filed Vitals:   02/06/16 1243  PainSc: 0-No pain                 Nolon Nations

## 2016-02-06 NOTE — Anesthesia Preprocedure Evaluation (Addendum)
Anesthesia Evaluation  Patient identified by MRN, date of birth, ID band Patient awake    Reviewed: Allergy & Precautions, NPO status , Patient's Chart, lab work & pertinent test results  History of Anesthesia Complications (+) history of anesthetic complications  Airway Mallampati: II  TM Distance: >3 FB Neck ROM: Full    Dental no notable dental hx.    Pulmonary neg pulmonary ROS, shortness of breath, asthma , sleep apnea , COPD, former smoker,    Pulmonary exam normal breath sounds clear to auscultation       Cardiovascular hypertension, Pt. on medications + CAD, + Past MI, + Peripheral Vascular Disease and + DOE  Normal cardiovascular exam+ dysrhythmias + pacemaker  Rhythm:Regular Rate:Normal  Pacer DDDR ~96% a-paced  Echo 01/2016   Neuro/Psych negative neurological ROS  negative psych ROS   GI/Hepatic Neg liver ROS, GERD  ,  Endo/Other  diabetes, Type 2, Oral Hypoglycemic Agents, Insulin DependentMorbid obesity  Renal/GU Renal disease     Musculoskeletal  (+) Arthritis ,   Abdominal   Peds  Hematology negative hematology ROS (+)   Anesthesia Other Findings   Reproductive/Obstetrics negative OB ROS                          Anesthesia Physical Anesthesia Plan  ASA: IV  Anesthesia Plan: MAC   Post-op Pain Management:    Induction: Intravenous  Airway Management Planned:   Additional Equipment:   Intra-op Plan:   Post-operative Plan:   Informed Consent: I have reviewed the patients History and Physical, chart, labs and discussed the procedure including the risks, benefits and alternatives for the proposed anesthesia with the patient or authorized representative who has indicated his/her understanding and acceptance.   Dental advisory given  Plan Discussed with: CRNA  Anesthesia Plan Comments:         Anesthesia Quick Evaluation

## 2016-02-07 ENCOUNTER — Encounter (HOSPITAL_COMMUNITY): Payer: Self-pay | Admitting: Gastroenterology

## 2016-02-07 DIAGNOSIS — I252 Old myocardial infarction: Secondary | ICD-10-CM | POA: Diagnosis not present

## 2016-02-07 DIAGNOSIS — Z950 Presence of cardiac pacemaker: Secondary | ICD-10-CM | POA: Diagnosis not present

## 2016-02-07 DIAGNOSIS — K8010 Calculus of gallbladder with chronic cholecystitis without obstruction: Secondary | ICD-10-CM | POA: Diagnosis present

## 2016-02-07 DIAGNOSIS — I447 Left bundle-branch block, unspecified: Secondary | ICD-10-CM | POA: Diagnosis present

## 2016-02-07 DIAGNOSIS — R1013 Epigastric pain: Secondary | ICD-10-CM | POA: Diagnosis present

## 2016-02-07 DIAGNOSIS — E1122 Type 2 diabetes mellitus with diabetic chronic kidney disease: Secondary | ICD-10-CM | POA: Diagnosis present

## 2016-02-07 DIAGNOSIS — I2510 Atherosclerotic heart disease of native coronary artery without angina pectoris: Secondary | ICD-10-CM | POA: Diagnosis present

## 2016-02-07 DIAGNOSIS — N400 Enlarged prostate without lower urinary tract symptoms: Secondary | ICD-10-CM | POA: Diagnosis present

## 2016-02-07 DIAGNOSIS — E662 Morbid (severe) obesity with alveolar hypoventilation: Secondary | ICD-10-CM | POA: Diagnosis present

## 2016-02-07 DIAGNOSIS — Z01810 Encounter for preprocedural cardiovascular examination: Secondary | ICD-10-CM

## 2016-02-07 DIAGNOSIS — E1165 Type 2 diabetes mellitus with hyperglycemia: Secondary | ICD-10-CM | POA: Diagnosis present

## 2016-02-07 DIAGNOSIS — I480 Paroxysmal atrial fibrillation: Secondary | ICD-10-CM | POA: Diagnosis present

## 2016-02-07 DIAGNOSIS — N183 Chronic kidney disease, stage 3 (moderate): Secondary | ICD-10-CM | POA: Diagnosis present

## 2016-02-07 DIAGNOSIS — K219 Gastro-esophageal reflux disease without esophagitis: Secondary | ICD-10-CM | POA: Diagnosis present

## 2016-02-07 DIAGNOSIS — I129 Hypertensive chronic kidney disease with stage 1 through stage 4 chronic kidney disease, or unspecified chronic kidney disease: Secondary | ICD-10-CM | POA: Diagnosis present

## 2016-02-07 DIAGNOSIS — J449 Chronic obstructive pulmonary disease, unspecified: Secondary | ICD-10-CM | POA: Diagnosis present

## 2016-02-07 DIAGNOSIS — E785 Hyperlipidemia, unspecified: Secondary | ICD-10-CM | POA: Diagnosis present

## 2016-02-07 DIAGNOSIS — Z87891 Personal history of nicotine dependence: Secondary | ICD-10-CM | POA: Diagnosis not present

## 2016-02-07 DIAGNOSIS — Z955 Presence of coronary angioplasty implant and graft: Secondary | ICD-10-CM | POA: Diagnosis not present

## 2016-02-07 DIAGNOSIS — K429 Umbilical hernia without obstruction or gangrene: Secondary | ICD-10-CM | POA: Diagnosis present

## 2016-02-07 DIAGNOSIS — Z7901 Long term (current) use of anticoagulants: Secondary | ICD-10-CM | POA: Diagnosis not present

## 2016-02-07 DIAGNOSIS — Z6841 Body Mass Index (BMI) 40.0 and over, adult: Secondary | ICD-10-CM | POA: Diagnosis not present

## 2016-02-07 DIAGNOSIS — Z794 Long term (current) use of insulin: Secondary | ICD-10-CM | POA: Diagnosis not present

## 2016-02-07 DIAGNOSIS — K8020 Calculus of gallbladder without cholecystitis without obstruction: Secondary | ICD-10-CM | POA: Diagnosis present

## 2016-02-07 DIAGNOSIS — N179 Acute kidney failure, unspecified: Secondary | ICD-10-CM | POA: Diagnosis present

## 2016-02-07 LAB — COMPREHENSIVE METABOLIC PANEL
ALBUMIN: 3.1 g/dL — AB (ref 3.5–5.0)
ALK PHOS: 158 U/L — AB (ref 38–126)
ALT: 161 U/L — ABNORMAL HIGH (ref 17–63)
ANION GAP: 10 (ref 5–15)
AST: 62 U/L — ABNORMAL HIGH (ref 15–41)
BILIRUBIN TOTAL: 5.1 mg/dL — AB (ref 0.3–1.2)
BUN: 11 mg/dL (ref 6–20)
CALCIUM: 8.8 mg/dL — AB (ref 8.9–10.3)
CO2: 24 mmol/L (ref 22–32)
Chloride: 103 mmol/L (ref 101–111)
Creatinine, Ser: 1.26 mg/dL — ABNORMAL HIGH (ref 0.61–1.24)
GFR calc Af Amer: >60 mL/min (ref >60)
GFR, EST NON AFRICAN AMERICAN: 57 mL/min — AB (ref >60)
GLUCOSE: 225 mg/dL — AB (ref 65–99)
POTASSIUM: 3.9 mmol/L (ref 3.5–5.1)
Sodium: 137 mmol/L (ref 135–145)
TOTAL PROTEIN: 6.8 g/dL (ref 6.5–8.1)

## 2016-02-07 LAB — HEPARIN LEVEL (UNFRACTIONATED): HEPARIN UNFRACTIONATED: 1.86 [IU]/mL — AB (ref 0.30–0.70)

## 2016-02-07 LAB — GLUCOSE, CAPILLARY
GLUCOSE-CAPILLARY: 254 mg/dL — AB (ref 65–99)
GLUCOSE-CAPILLARY: 212 mg/dL — AB (ref 65–99)
Glucose-Capillary: 210 mg/dL — ABNORMAL HIGH (ref 65–99)
Glucose-Capillary: 207 mg/dL — ABNORMAL HIGH (ref 65–99)

## 2016-02-07 LAB — APTT: aPTT: 35 seconds (ref 24–37)

## 2016-02-07 LAB — CBC
HEMATOCRIT: 37.3 % — AB (ref 39.0–52.0)
Hemoglobin: 12 g/dL — ABNORMAL LOW (ref 13.0–17.0)
MCH: 28.8 pg (ref 26.0–34.0)
MCHC: 32.2 g/dL (ref 30.0–36.0)
MCV: 89.7 fL (ref 78.0–100.0)
Platelets: 186 10*3/uL (ref 150–400)
RBC: 4.16 MIL/uL — ABNORMAL LOW (ref 4.22–5.81)
RDW: 14.3 % (ref 11.5–15.5)
WBC: 4.7 10*3/uL (ref 4.0–10.5)

## 2016-02-07 MED ORDER — BISACODYL 5 MG PO TBEC
10.0000 mg | DELAYED_RELEASE_TABLET | Freq: Every day | ORAL | Status: DC | PRN
  Administered 2016-02-07 – 2016-02-08 (×2): 10 mg via ORAL
  Filled 2016-02-07 (×3): qty 2

## 2016-02-07 MED ORDER — HEPARIN (PORCINE) IN NACL 100-0.45 UNIT/ML-% IJ SOLN
1700.0000 [IU]/h | INTRAMUSCULAR | Status: DC
  Administered 2016-02-07: 1400 [IU]/h via INTRAVENOUS
  Filled 2016-02-07 (×2): qty 250

## 2016-02-07 NOTE — Progress Notes (Signed)
Patient refused CPAP tonight. Wears PRN and will call if needed

## 2016-02-07 NOTE — Progress Notes (Signed)
Daily Rounding Note  02/07/2016, 8:35 AM    SUBJECTIVE:       Bloating especially in upper belly, continues.  No nausea, tolerating solids.  Severe/sharp upper belly pain not recurrent. Overall feeling better.  No BM in a few days.  OBJECTIVE:         Vital signs in last 24 hours:    Temp:  [97.9 F (36.6 C)] 97.9 F (36.6 C) (03/17 2042) Pulse Rate:  [69-72] 69 (03/17 2042) Resp:  [14-19] 18 (03/17 2042) BP: (130-156)/(55-82) 143/61 mmHg (03/17 2042) SpO2:  [95 %-97 %] 96 % (03/17 2042) Last BM Date: 02/04/16 Filed Weights   02/05/16 0106  Weight: 126.554 kg (279 lb)   General: looks well.    Heart: RRR Chest: clear bil.  No cough or SOB Abdomen: distended and tense.  BS normal.  Not tender  Extremities: minor LE edema.  Neuro/Psych:  Oriented x 3.  Fully alert. No gross tremor or weakness.   Intake/Output from previous day: 03/17 0701 - 03/18 0700 In: 1087.5 [P.O.:30; I.V.:857.5; IV Piggyback:200] Out: 227 [Urine:227]  Intake/Output this shift:    Lab Results:  Recent Labs  02/05/16 0119 02/06/16 0530 02/07/16 0700  WBC 9.1 6.7 4.7  HGB 13.5 11.6* 12.0*  HCT 41.4 35.6* 37.3*  PLT 201 175 186   BMET  Recent Labs  02/05/16 0119 02/06/16 0530  NA 137 137  K 4.8 3.5  CL 101 104  CO2 26 22  GLUCOSE 254* 187*  BUN 18 19  CREATININE 1.25* 1.47*  CALCIUM 9.6 8.6*   LFT  Recent Labs  02/05/16 0119 02/05/16 0503 02/06/16 0530  PROT 7.5  --  6.4*  ALBUMIN 3.9  --  3.1*  AST 339*  --  92*  ALT 228*  --  207*  ALKPHOS 96  --  117  BILITOT 3.0* 4.4* 5.7*  BILIDIR  --  2.7*  --   IBILI  --  1.7*  --    PT/INR No results for input(s): LABPROT, INR in the last 72 hours. Hepatitis Panel No results for input(s): HEPBSAG, HCVAB, HEPAIGM, HEPBIGM in the last 72 hours.  Studies/Results: Nm Hepatobiliary Including Gb  02/06/2016  CLINICAL DATA:  Upper abdominal pain and bloating with  elevated LFTs EXAM: NUCLEAR MEDICINE HEPATOBILIARY IMAGING TECHNIQUE: Sequential images of the abdomen were obtained out to 60 minutes following intravenous administration of radiopharmaceutical. RADIOPHARMACEUTICALS:  Five mCi Tc-65m  Choletec IV COMPARISON:  02/05/2016 FINDINGS: There is adequate uptake of radioactive tracer throughout the liver. The biliary system is not well visualized although some minimal activity passes into the small bowel. Nonvisualization of the gallbladder is noted despite treatment with 5 mg of morphine. IMPRESSION: Normal uptake of biliary tracer with only minimal excretion into the biliary tree. Nonvisualization of the gallbladder is noted despite morphine therapy. Given the poor opacification of the biliary tree this is equivocal for cholecystitis although suggests a possible cholestasis pattern. Electronically Signed   By: Inez Catalina M.D.   On: 02/06/2016 18:25    ASSESMENT:   *   Acute upper abdominal bloating and discomfort with DOE. Acute elevation of LFTs. Previous similar but not as prolonged or intense sxs with milder LFT elevation 09/2015 had resolved. Fatty liver, stable hepatic cysts and granulamas, but no biliary tree or GB pathology on imaging within last 3 to 4 months.  Periampullary diverticulum, gastritis and H Pylori noted on EGD 10/2015.  S/p prevpac,  daily PPI.  EUS 3/17: gallstones, o/w normal GB, no dilated ducts or choledocholithiasis. HIDA 3/17: normal uptake into liver, no tracer uptake to GB, poor opacification of biliary tree: cholestasis?  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   * Chronic AC for PAF. Previous cardiac stenting, including DES in 06/2013 Hx LV dysfunction in 38s in 06/2013, normalized on most recent echo of 05/2014.  Switched from Coumadin to Xarelto after 01/05/16 visit with EP Dr [name].  Per [facility]: Xarelto can cause hepatitis and ALT elevation as well.    PLAN   *  Surgical  consult.    *  Ordered Echo 3 days ago, but never done.  At this point do not think we need it.    *  CMET in progress, repeat in AM. .    *  Dulcolax for constipation.     Azucena Freed  02/07/2016, 8:35 AM Pager: [phone]  ________________________________________________________________________  Velora Heckler GI MD note:  I personally examined the patient, reviewed the data and agree with the assessment and plan described above.  LFTs about the same as yesterday.  HIDA results are equivocal.  EUS yesterday did not find CBD stones (has at least 95% sensitivity for CBD stones).  Did show clear stones in GB as well as somewhat thickened GB and CBD walls.  Clinical significance of the thickening is uncertain.  I am not sure what is causing his LFT rise, abd pains but he has had similar episode in past see cut and pasted office notes from GI 09/2015 below...    ..."episode on 10/16/2015 abdomen frequently becomes tight and distended feeling with this these episodes. This episode lasted longer was more severe and he went to the emergency room because he also felt somewhat short of breath. He was seen at [facility] and had a CTangio which showed a 1.5 cm lesion in the right hepatic lobe normal-appearing gallbladder and mild atrophy of the pancreas and some calcified plaque at the origin of the celiac and SMA no aneurysm or dissection. Labs were pertinent for an elevated lactic acid level at 2.4 lipase was 170 within their normal range BNP 417 WBC 7.5, hemoglobin 13.8, total bili 2.1, AST of 309, ALP of 160 and alkaline phosphatase of 100. He then presented to the emergency room here in [place] and had bedside ultrasound done interpreted by the ER physician and read as no gallstones common bile duct normal and no gallbladder wall thickening. After anti-emetics and analgesics his pain eventually settled down and he was allowed discharged home."  I've asked general surgery to see him for their  opinion.  He understands the uncertainty here and that  that IF surgery decides to proceed with cholecystectomy it might not help. Will follow along.  Owens Loffler, MD [facility] Pager [phone]

## 2016-02-07 NOTE — Consult Note (Addendum)
Reason for Consult:abdominal pain, bloating and gallstones Referring Physician: [name] is an 70 y.o. male.  HPI: Charles [name] initially had a severe episode of right upper quadrant pain last November. Workup at that time was unremarkable. He had some further similar episodes with epigastric and right upper quadrant pain and bloating. He underwent evaluation at [place]. No clear cause was initially identified. He had a similar episode earlier this week and was admitted to the hospital. LFTs were elevated. He has undergone a thorough evaluation by [name] from [name] including endoscopic ultrasound which did show some gallstones, abdominal ultrasound which showed gallstones but no evidence of cholecystitis, and hiatus scan which showed no filling of the gallbladder but significant biliary stasis so it was read as equivocal. Charles [name] is tolerating a diet at this time but has nausea after eating and occasional epigastric pain. He is on a heparin drip. He takes Nutritional therapist at home for [facility] and sees Dr. [name] from cardiology. Of note, he underwent small bowel resection at age 64.  Past Medical History  Diagnosis Date  . Hypertension   . Coronary artery disease     last cath 2008,  remote PCI of the LAD with Lcx/OM bifurcation stenting and proximal RCA stenting  . Other dysphagia   . Dyslipidemia   . Exogenous obesity     severe  . Hx of colonic polyps 09/2006    inflammatory polyp at hepatic flexure. not adenomatous or malignant.   . Long term (current) use of anticoagulants   . Chronic venous insufficiency     with prior venous stasis ulcers  . Statin intolerance     Hx of. Now tolerating Zetia & Livalo well.   . Weakness     In bilateral legs. Duplex performed 04/02/13   . DOE (dyspnea on exertion)     TEE 07/10/13 - LV EF 40-45%  . History of nuclear stress test 01/02/13    OVERALL IMPRESSION: Low risk stress nuclear study. Fixed inferolateral defect, likely scar. No  reversible ischemia. LV wall motion: Inferolateral hypokinesis. LVEF 47%. ([place])  . MI (myocardial infarction) ([facility]) 1995  . SSS (sick sinus syndrome) ([place])   . PAF (paroxysmal atrial fibrillation) ([place])   . LBBB (left bundle branch block)     He has developed a native LBBB which was seen on his last visit of March 2014 (From OV note 07/03/13)   . Regional wall motion abnormality of heart     By TEE 07/10/13  . Chronic systolic dysfunction of left ventricle     EF 60 to 65% by [id number], grade 1 diatolic dysfunction  . Left atrial dilatation     moderate by echo  . Complication of anesthesia     "when coming out, I choke and get very restless if breathing tube is still in"  . Presence of permanent cardiac pacemaker 08/28/2008    [employer] Zephyr XL DR 5826, dual chamber, rate responsive. No arrhythmias recorded and he has an excellent threshold.  . Asthma     "touch q once & awhile" (02/05/2016)  . OSA on CPAP     "nasal CPAP"  . Diabetes mellitus, type II ([place])     AODM  . History of blood transfusion ~ 2015    related to "when they went in to get my kidney stones"  . Osteoarthritis, knee   . Arthritis     "knees, hands, base of my back" (02/05/2016)  . Nephrolithiasis  Past Surgical History  Procedure Laterality Date  . Appendectomy  1962  . Small intestine surgery  1962  . Insert / replace / remove pacemaker  08/28/08    St. Jude Zephyr XL DR 5826, dual chamber, rate responsive. No arrhythmias recorded and he has an excellent threshold.  . Cystoscopy w/ ureteral stent placement Left 06/16/2009; 06/26/2009    Left proximal ureteral stone/notes 03/23/2011  . Coronary angioplasty with stent placement  1998 & 2008    Last cath in 2008, remote LAD stenting: Cx/OM bifurcation, proximal right coronary.   . Esophagogastroduodenoscopy  09/2015    w/biopsy  . Eus N/A 02/06/2016    Procedure: UPPER ENDOSCOPIC ULTRASOUND (EUS) RADIAL;  Surgeon: [name], MD;  Location: [facility];   Service: Endoscopy;  Laterality: N/A;    Family History  Problem Relation Age of Onset  . Hypertension Sister   . Heart disease Mother   . Arthritis Sister   . Epilepsy Brother   . Stroke Maternal Grandmother   . Lung cancer Maternal Grandfather   . Stroke Paternal Grandfather   . Colon polyps Sister     Social History:  reports that he quit smoking about 41 years ago. His smoking use included Cigarettes. He has a 5 pack-year smoking history. He has never used smokeless tobacco. He reports that he drinks alcohol. He reports that he does not use illicit drugs.  Allergies:  Allergies  Allergen Reactions  . Chocolate Hives, Shortness Of Breath and Swelling  . Statins Other (See Comments)    Mental changes, muscle aches  . Codeine     Itching  . Oxytetracycline     Flushing in sunlight    Medications:  Scheduled: . ciprofloxacin  400 mg Intravenous Q12H  . insulin aspart  0-20 Units Subcutaneous TID WC  . insulin aspart  0-5 Units Subcutaneous QHS  . insulin NPH Human  40 Units Subcutaneous BID AC & HS  . pantoprazole  40 mg Oral Daily  . sotalol  120 mg Oral BID   Continuous: . sodium chloride    . sodium chloride    . sodium chloride 75 mL/hr at 02/07/16 1010   CHY:IFOYDXAJOINOM **OR** acetaminophen, bisacodyl, HYDROcodone-acetaminophen, morphine injection, nitroGLYCERIN, ondansetron **OR** ondansetron (ZOFRAN) IV, technetium TC 30M mebrofenin  Results for orders placed or performed during the hospital encounter of 02/05/16 (from the past 48 hour(s))  Glucose, capillary     Status: Abnormal   Collection Time: 02/05/16 12:29 PM  Result Value Ref Range   Glucose-Capillary 236 (H) 65 - 99 mg/dL  Glucose, capillary     Status: Abnormal   Collection Time: 02/05/16  4:22 PM  Result Value Ref Range   Glucose-Capillary 150 (H) 65 - 99 mg/dL  Glucose, capillary     Status: Abnormal   Collection Time: 02/05/16 10:31 PM  Result Value Ref Range   Glucose-Capillary 145 (H)  65 - 99 mg/dL   Comment 1 Notify RN   Comprehensive metabolic panel     Status: Abnormal   Collection Time: 02/06/16  5:30 AM  Result Value Ref Range   Sodium 137 135 - 145 mmol/L   Potassium 3.5 3.5 - 5.1 mmol/L    Comment: DELTA CHECK NOTED   Chloride 104 101 - 111 mmol/L   CO2 22 22 - 32 mmol/L   Glucose, Bld 187 (H) 65 - 99 mg/dL   BUN 19 6 - 20 mg/dL   Creatinine, Ser 1.47 (H) 0.61 - 1.24 mg/dL  Calcium 8.6 (L) 8.9 - 10.3 mg/dL   Total Protein 6.4 (L) 6.5 - 8.1 g/dL   Albumin 3.1 (L) 3.5 - 5.0 g/dL   AST 92 (H) 15 - 41 U/L   ALT 207 (H) 17 - 63 U/L   Alkaline Phosphatase 117 38 - 126 U/L   Total Bilirubin 5.7 (H) 0.3 - 1.2 mg/dL   GFR calc non Af Amer 47 (L) >60 mL/min   GFR calc Af Amer 54 (L) >60 mL/min    Comment: (NOTE) The eGFR has been calculated using the CKD EPI equation. This calculation has not been validated in all clinical situations. eGFR's persistently <60 mL/min signify possible Chronic Kidney Disease.    Anion gap 11 5 - 15  CBC     Status: Abnormal   Collection Time: 02/06/16  5:30 AM  Result Value Ref Range   WBC 6.7 4.0 - 10.5 K/uL   RBC 4.03 (L) 4.22 - 5.81 MIL/uL   Hemoglobin 11.6 (L) 13.0 - 17.0 g/dL   HCT 35.6 (L) 39.0 - 52.0 %   MCV 88.3 78.0 - 100.0 fL   MCH 28.8 26.0 - 34.0 pg   MCHC 32.6 30.0 - 36.0 g/dL   RDW 14.2 11.5 - 15.5 %   Platelets 175 150 - 400 K/uL  Glucose, capillary     Status: Abnormal   Collection Time: 02/06/16  7:24 AM  Result Value Ref Range   Glucose-Capillary 181 (H) 65 - 99 mg/dL  Glucose, capillary     Status: Abnormal   Collection Time: 02/06/16  1:35 PM  Result Value Ref Range   Glucose-Capillary 182 (H) 65 - 99 mg/dL  Glucose, capillary     Status: Abnormal   Collection Time: 02/06/16  6:22 PM  Result Value Ref Range   Glucose-Capillary 156 (H) 65 - 99 mg/dL  Glucose, capillary     Status: Abnormal   Collection Time: 02/06/16 10:09 PM  Result Value Ref Range   Glucose-Capillary 236 (H) 65 - 99 mg/dL    Comment 1 Notify RN   Comprehensive metabolic panel     Status: Abnormal   Collection Time: 02/07/16  7:00 AM  Result Value Ref Range   Sodium 137 135 - 145 mmol/L   Potassium 3.9 3.5 - 5.1 mmol/L   Chloride 103 101 - 111 mmol/L   CO2 24 22 - 32 mmol/L   Glucose, Bld 225 (H) 65 - 99 mg/dL   BUN 11 6 - 20 mg/dL   Creatinine, Ser 1.26 (H) 0.61 - 1.24 mg/dL   Calcium 8.8 (L) 8.9 - 10.3 mg/dL   Total Protein 6.8 6.5 - 8.1 g/dL   Albumin 3.1 (L) 3.5 - 5.0 g/dL   AST 62 (H) 15 - 41 U/L   ALT 161 (H) 17 - 63 U/L   Alkaline Phosphatase 158 (H) 38 - 126 U/L   Total Bilirubin 5.1 (H) 0.3 - 1.2 mg/dL   GFR calc non Af Amer 57 (L) >60 mL/min   GFR calc Af Amer >60 >60 mL/min    Comment: (NOTE) The eGFR has been calculated using the CKD EPI equation. This calculation has not been validated in all clinical situations. eGFR's persistently <60 mL/min signify possible Chronic Kidney Disease.    Anion gap 10 5 - 15  CBC     Status: Abnormal   Collection Time: 02/07/16  7:00 AM  Result Value Ref Range   WBC 4.7 4.0 - 10.5 K/uL   RBC 4.16 (L) 4.22 -  5.81 MIL/uL   Hemoglobin 12.0 (L) 13.0 - 17.0 g/dL   HCT 37.3 (L) 39.0 - 52.0 %   MCV 89.7 78.0 - 100.0 fL   MCH 28.8 26.0 - 34.0 pg   MCHC 32.2 30.0 - 36.0 g/dL   RDW 14.3 11.5 - 15.5 %   Platelets 186 150 - 400 K/uL  Glucose, capillary     Status: Abnormal   Collection Time: 02/07/16  7:49 AM  Result Value Ref Range   Glucose-Capillary 210 (H) 65 - 99 mg/dL   Comment 1 Notify RN    Comment 2 Document in Chart     Nm Hepatobiliary Including Gb  02/06/2016  CLINICAL DATA:  Upper abdominal pain and bloating with elevated LFTs EXAM: NUCLEAR MEDICINE HEPATOBILIARY IMAGING TECHNIQUE: Sequential images of the abdomen were obtained out to 60 minutes following intravenous administration of radiopharmaceutical. RADIOPHARMACEUTICALS:  Five mCi Tc-51m Choletec IV COMPARISON:  02/05/2016 FINDINGS: There is adequate uptake of radioactive tracer  throughout the liver. The biliary system is not well visualized although some minimal activity passes into the small bowel. Nonvisualization of the gallbladder is noted despite treatment with 5 mg of morphine. IMPRESSION: Normal uptake of biliary tracer with only minimal excretion into the biliary tree. Nonvisualization of the gallbladder is noted despite morphine therapy. Given the poor opacification of the biliary tree this is equivocal for cholecystitis although suggests a possible cholestasis pattern. Electronically Signed   By: MInez CatalinaM.D.   On: 02/06/2016 18:25    Review of Systems  Constitutional: Negative for fever and chills.  HENT: Negative.   Eyes: Negative for blurred vision.  Respiratory: Negative for cough.   Cardiovascular: Negative for chest pain.  Gastrointestinal: Positive for nausea and abdominal pain.  Genitourinary: Negative.   Musculoskeletal: Negative.   Skin: Negative.   Neurological: Negative.   Endo/Heme/Allergies: Bruises/bleeds easily.  Psychiatric/Behavioral: Negative.    Blood pressure 143/61, pulse 69, temperature 97.9 F (36.6 C), temperature source Oral, resp. rate 18, height _0  (1.727 m), weight 126.554 kg (279 lb), SpO2 96 %. Physical Exam  Constitutional: He is oriented to person, place, and time. He appears well-developed and well-nourished.  HENT:  Head: Normocephalic and atraumatic.  Right Ear: External ear normal.  Left Ear: External ear normal.  Nose: Nose normal.  Mouth/Throat: Oropharynx is clear and moist.  Eyes: EOM are normal. Pupils are equal, round, and reactive to light.  Wears glasses  Neck: Normal range of motion. Neck supple. No tracheal deviation present.  Cardiovascular:  Irregularly irregular  Respiratory: Effort normal and breath sounds normal. No respiratory distress. He has no wheezes.  Pacemaker left chest  GI: Soft. He exhibits no distension. There is no tenderness. There is no rebound and no guarding.     Right-sided abdominal scar, small reducible supraumbilical hernia  Neurological: He is alert and oriented to person, place, and time.  Skin: Skin is warm.  Psychiatric: He has a normal mood and affect.    Assessment/Plan: Gallstones & elevated liver function tests - no common bile duct stone seen on endoscopic ultrasound by Dr. JArdis Hughs His symptoms do correlate with gallbladder disease. Recommend cardiology evaluation for perioperative risk evaluation. We will consider cholecystectomy pending their findings. Heparin would have to be held 4 hours prior to that. We will follow-up.  Vali Capano E 02/07/2016, 10:31 AM

## 2016-02-07 NOTE — Progress Notes (Addendum)
[facility] PROGRESS NOTE  Charles [name] [id number] DOB: March 10, 1946 DOA: 02/05/2016 PCP: [name], MD    70 year active ? [works in [place], [place]] DM ty II CAD-Prior [place]hen[place], Stent Placement 1998 and 2008, cath 2008, Myoview 2014 was LOW risk-LVEF 47% Afib s/p PPM CHad2Vasc2 score=4, On Xarelto abdominal pain, intermittent since last November  had Laparotomy + appy & 12 " bowel resected age 70, Prior Dysphagia Last colonoscopy 2007 Treated Hpylori 10/24/15 stg III CKD Controlled HTn DM   admitted for the evaluation of the above. GI consulted and he underwent endoscopic ultrasound showed normal UGI tract, but there were several small shadowing stone in the gall bladder.  There some GB thickening and CBD wall thickening. He was started on IV antibiotics.  HIDA scan was negative for acute cholecystitis. Liver function tests improving.  Gen surgery consulted Cardiology consulted for Peri-op risk eval  Assessment/Plan:  1. Intermittent epigastric pain of unclear etiology:-etiology GB stones.-note h/o recent [10/2015] rx H pylori Underwent EGD and endoscopic ultrasound and see the results above.  HIDA scan negative.  Liver function improving.  Further management as per GI/Gen surgery  2. STAGE 3 CKD: Stopped HCTZ, and lisinopril, probably from pre renal.  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   oral hypoglycemics metformin 100 qd on hold, Amaryl 4 bid on hold Sugars 210-236-- Continue NHP 40 bid Start D5 if pre-op for 3/19--defer to surgery  OSA on CPAP at night. .    Consultants:  GI   Gen Surg  Cardiology Procedures:  Endoscopic Ultrasound  HIDA SCAN  Antibiotics:  cipro started on 3/17  HPI/Subjective:  Looks good No pain abd still feels tight No n/v Wondering when is surgery  Objective: Filed Vitals:   02/06/16 1240 02/06/16 2042  BP: 130/68 143/61  Pulse: 70 69  Temp:  97.9 F (36.6 C)  Resp: 14 18    Intake/Output Summary (Last 24 hours) at 02/07/16 1106 Last data filed at 02/07/16 0600  Gross per 24 hour  Intake 1057.5 ml  Output      1 ml  Net 1056.5 ml   Filed Weights   02/05/16 0106  Weight: 126.554 kg (279 lb)    Exam:   General:  Alert comfortable  Cardiovascular: s1s2  Respiratory: ctab, no wheezing or rhonchi  Abdomen: soft non tender non distended bowel sounds heard  Musculoskeletal: trace pedal edema  Data Reviewed: Basic Metabolic Panel:  Recent Labs Lab 02/05/16 0119 02/06/16 0530 02/07/16 0700  NA 137 137 137  K 4.8 3.5 3.9  CL 101 104 103  CO2 26 22 24   GLUCOSE 254* 187* 225*  BUN 18 19 11   CREATININE 1.25* 1.47* 1.26*  CALCIUM 9.6 8.6* 8.8*   Liver Function Tests:  Recent Labs Lab 02/05/16 0119 02/05/16 0503 02/06/16 0530 02/07/16 0700  AST 339*  --  92*  62*  ALT 228*  --  207* 161*  ALKPHOS 96  --  117 158*  BILITOT 3.0* 4.4* 5.7* 5.1*  PROT 7.5  --  6.4* 6.8  ALBUMIN 3.9  --  3.1* 3.1*    Recent Labs Lab 02/05/16 0119  LIPASE 33   No results for input(s): AMMONIA in the last 168 hours. CBC:  Recent Labs Lab 02/05/16 0119 02/06/16 0530 02/07/16 0700  WBC 9.1 6.7 4.7  HGB 13.5 11.6* 12.0*  HCT 41.4 35.6* 37.3*  MCV 89.6 88.3 89.7  PLT 201 175 186   Cardiac Enzymes: No results for  input(s): CKTOTAL, CKMB, CKMBINDEX, TROPONINI in the last 168 hours. BNP (last 3 results) No results for input(s): BNP in the last 8760 hours.  ProBNP (last 3 results)  Recent Labs  10/21/15 0932  PROBNP 364.0*    CBG:  Recent Labs Lab 02/06/16 0724 02/06/16 1335 02/06/16 1822 02/06/16 2209 02/07/16 0749  GLUCAP 181* 182* 156* 236* 210*    No results found for this or any previous visit (from the past 240 hour(s)).   Studies: Nm Hepatobiliary Including Gb  02/06/2016  CLINICAL DATA:  Upper abdominal pain and bloating with elevated LFTs EXAM: NUCLEAR MEDICINE HEPATOBILIARY IMAGING TECHNIQUE: Sequential images of the abdomen were obtained out to 60 minutes following intravenous administration of radiopharmaceutical. RADIOPHARMACEUTICALS:  Five mCi Tc-33m  Choletec IV COMPARISON:  02/05/2016 FINDINGS: There is adequate uptake of radioactive tracer throughout the liver. The biliary system is not well visualized although some minimal activity passes into the small bowel. Nonvisualization of the gallbladder is noted despite treatment with 5 mg of morphine. IMPRESSION: Normal uptake of biliary tracer with only minimal excretion into the biliary tree. Nonvisualization of the gallbladder is noted despite morphine therapy. Given the poor opacification of the biliary tree this is equivocal for cholecystitis although suggests a possible cholestasis pattern. Electronically Signed   By: Inez Catalina M.D.   On: 02/06/2016 18:25    Scheduled Meds: . ciprofloxacin  400 mg Intravenous Q12H  . insulin aspart  0-20 Units Subcutaneous TID WC  . insulin aspart  0-5 Units Subcutaneous QHS  . insulin NPH Human  40 Units Subcutaneous BID AC & HS  . pantoprazole  40 mg Oral Daily  . sotalol  120 mg Oral BID   Continuous Infusions: . sodium chloride    . sodium chloride    . sodium chloride 75 mL/hr at 02/07/16 1010    Principal Problem:   Epigastric abdominal pain Active Problems:   Atrial  fibrillation ([facility])   Long term (current) use of anticoagulants   OSA (obstructive sleep apnea)   Cholelithiasis   GERD (gastroesophageal reflux disease)   Diabetes mellitus type 2 in obese ([place])    Time spent: 30 minutes.     Verneita Griffes, MD [facility] [phone]

## 2016-02-07 NOTE — Progress Notes (Signed)
ANTICOAGULATION CONSULT NOTE - Initial Consult  Pharmacy Consult for Heparin (transition from [place]) Indication: atrial fibrillation  Allergies  Allergen Reactions  . Chocolate Hives, Shortness Of Breath and Swelling  . Statins Other (See Comments)    Mental changes, muscle aches  . Codeine     Itching  . Oxytetracycline     Flushing in sunlight    Patient Measurements: Height: 5\' 8"  (172.7 cm) Weight: 279 lb (126.554 kg) IBW/kg (Calculated) : 68.4 Heparin Dosing Weight: 97.6 kg  Labs:  Recent Labs  02/05/16 0119 02/06/16 0530 02/07/16 0700  HGB 13.5 11.6* 12.0*  HCT 41.4 35.6* 37.3*  PLT 201 175 186  CREATININE 1.25* 1.47* 1.26*   Estimated Creatinine Clearance: 71.8 mL/min (by C-G formula based on Cr of 1.26).  Medications:  Scheduled:  . ciprofloxacin  400 mg Intravenous Q12H  . insulin aspart  0-20 Units Subcutaneous TID WC  . insulin aspart  0-5 Units Subcutaneous QHS  . insulin NPH Human  40 Units Subcutaneous BID AC & HS  . pantoprazole  40 mg Oral Daily  . sotalol  120 mg Oral BID   Assessment: 70 year old male on Xarelto prior to admission for atrial fibrillation now transitioning to IV heparin in the setting of elevated LFTs.  Last dose of Xarelto was on 3/17 at 1832PM. Will initiate IV heparin 24 hours after this dose. Will obtain baseline levels to help direct therapy.  Goal of Therapy:  Heparin level 0.3-0.7 units/ml Monitor platelets by anticoagulation protocol: Yes   Plan:  Baseline aPTT and HL  Heparin drip at 1400 units/hr - to start at 1830 PM. Heparin level 6 hours after rate initiated.  Daily heparin level and CBC.   Sloan Leiter, PharmD, BCPS Clinical Pharmacist [phone]  02/07/2016,9:58 AM

## 2016-02-07 NOTE — H&P (Signed)
CARDIOLOGY CONSULT NOTE  Patient ID: Charles [name] MRN: [id number] DOB/AGE: [birth year] 70 y.o.  Admit date: 02/05/2016 Primary Physician [name], MD Primary Cardiologist Dr. [name] Chief Complaint  Epigastric discomfort.  HPI:   The patient has a distant history of CAD with PCI.  His last stress test was a low risk perfusion study in 2014.    Echo in 2015 demonstrated EF 60% without other structural abnormalities.    He has a pacemaker for treatment of SSS.  He has a history of PAF.    He is admitted for evaluation of epigastric discomfort.    He will likely need cholecystectomy.  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  The patient denies any new symptoms such as chest discomfort, neck or arm discomfort. There has been no new shortness of breath, PND or orthopnea. There have been no reported palpitations, presyncope or syncope.     Past Medical History  Diagnosis Date  . Hypertension   . Coronary artery disease     last cath 2008,  remote PCI of the LAD with Lcx/OM bifurcation stenting and proximal RCA stenting  . Dyslipidemia   . Exogenous obesity     severe  . Hx of colonic polyps 09/2006    inflammatory polyp at hepatic flexure. not adenomatous or malignant.   . Long term (current) use of anticoagulants   . Chronic venous insufficiency     with prior venous stasis ulcers  . Statin intolerance     Hx of. Now tolerating Zetia & Livalo well.   . DOE (dyspnea on exertion)     TEE 07/10/13 - LV EF 40-45%  . MI (myocardial infarction) ([place]) 1995  . SSS (sick sinus syndrome) ([place])   . PAF (paroxysmal atrial fibrillation) ([place])   . LBBB (left bundle branch block)     He has developed a native LBBB which was seen on his last visit of March 2014 (From OV note 07/03/13)   . Complication of anesthesia     "when coming out, I choke and get very restless if breathing  tube is still in"  . Presence of permanent cardiac pacemaker 08/28/2008    St. Jude Zephyr XL DR 5826, dual chamber, rate responsive. No arrhythmias recorded and he has an excellent threshold.  . Asthma     "touch q once & awhile" (02/05/2016)  . OSA on CPAP     "nasal CPAP"  . Diabetes mellitus, type II ([place])     AODM  . History of blood transfusion ~ 2015    related to "when they went in to get my kidney stones"  . Osteoarthritis, knee   . Arthritis     "knees, hands, base of my back" (02/05/2016)  . Nephrolithiasis     Past Surgical History  Procedure Laterality Date  . Appendectomy  1962  . Small intestine surgery  1962  . Insert / replace / remove pacemaker  08/28/08    St. Jude Zephyr XL DR 5826, dual chamber, rate responsive. No arrhythmias recorded and he has an excellent threshold.  . Cystoscopy w/ ureteral stent placement Left 06/16/2009; 06/26/2009    Left proximal ureteral stone/notes 03/23/2011  . Coronary angioplasty with stent placement  1998 & 2008    Last cath in 2008, remote LAD stenting: Cx/OM bifurcation, proximal right coronary.   . Esophagogastroduodenoscopy  09/2015    w/biopsy  . Eus N/A 02/06/2016    Procedure: UPPER ENDOSCOPIC ULTRASOUND (EUS) RADIAL;  Surgeon: [name], MD;  Location: [facility];  Service: Endoscopy;  Laterality: N/A;    Allergies  Allergen Reactions  . Chocolate Hives, Shortness Of Breath and Swelling  . Statins Other (See Comments)    Mental changes, muscle aches  . Codeine     Itching  . Oxytetracycline     Flushing in sunlight   Prescriptions prior to admission  Medication Sig Dispense Refill Last Dose  . albuterol (PROVENTIL HFA;VENTOLIN HFA) 108 (90 BASE) MCG/ACT inhaler Inhale 1 puff into the lungs every 6 (six) hours as needed for wheezing or shortness of breath. Reported on 12/15/2015   unk  . alfuzosin (UROXATRAL) 10 MG 24 hr tablet Take 10 mg by mouth daily with breakfast.   02/04/2016 at Unknown time  . dicyclomine  (BENTYL) 10 MG capsule Take 1-2 capsules (10-20 mg total) by mouth 4 (four) times daily -  before meals and at bedtime. 360 capsule 3 02/04/2016 at Unknown time  . glimepiride (AMARYL) 4 MG tablet Take 4 mg by mouth 2 (two) times daily.   02/04/2016 at Unknown time  . hydrochlorothiazide (HYDRODIURIL) 25 MG tablet Take 1 tablet (25 mg total) by mouth daily. 30 tablet 1 02/04/2016 at Unknown time  . HYDROcodone-acetaminophen (NORCO/VICODIN) 5-325 MG tablet Take 1 tablet by mouth 2 (two) times daily as needed for moderate pain.   last month  . insulin regular (NOVOLIN R,HUMULIN R) 100 units/mL injection Inject 40 Units into the skin 3 (three) times daily.    02/04/2016 at Unknown time  . lisinopril (PRINIVIL,ZESTRIL) 40 MG tablet Take 1 tablet (40 mg total) by mouth daily. 90 tablet 3 02/04/2016 at Unknown time  . metFORMIN (GLUCOPHAGE) 500 MG tablet Take 1,000 mg by mouth daily. With meal   02/04/2016 at Unknown time  . nitroGLYCERIN (NITROSTAT) 0.4 MG SL tablet Place 1 tablet (0.4 mg total) under the tongue every 5 (five) minutes as needed for chest pain. 25 tablet 5 never  . NOVOLIN N RELION 100 UNIT/ML injection Inject 40 Units into the skin 2 (two) times daily. Use as directecd   02/04/2016 at Unknown time  . omeprazole (PRILOSEC) 40 MG capsule Take 1 capsule (40 mg total) by mouth daily before breakfast. 90 capsule 3 02/04/2016 at Unknown time  . ondansetron (ZOFRAN) 4 MG tablet Take 4 mg by mouth every 8 (eight) hours as needed for nausea or vomiting.   couple months  . rivaroxaban (XARELTO) 20 MG TABS tablet Take 1 tablet (20 mg total) by mouth daily with supper. 30 tablet 11 02/04/2016 at Unknown time  . sotalol (BETAPACE) 120 MG tablet Take 1 tablet (120 mg total) by mouth 2 (two) times daily. 60 tablet 10 02/04/2016 at 2330  . traMADol-acetaminophen (ULTRACET) 37.5-325 MG per tablet Take 1 tablet by mouth daily.    02/04/2016 at Unknown time   Family History  Problem Relation Age of Onset  .  Hypertension Sister   . Heart disease Mother   . Arthritis Sister   . Epilepsy Brother   . Stroke Maternal Grandmother   . Lung cancer Maternal Grandfather   . Stroke Paternal Grandfather   . Colon polyps Sister     Social History   Social History  . Marital Status: Married    Spouse Name: N/A  . Number of Children: 1  . Years of Education: N/A   Occupational  History  . [employer] History Main Topics  . Smoking status: Former Smoker -- 1.00 packs/day for 5 years    Types: Cigarettes    Quit date: 11/22/1974  . Smokeless tobacco: Never Used  . Alcohol Use: 0.0 oz/week    0 Standard drinks or equivalent per week     Comment: 02/05/2016 "maybe 6 pack of beer/month"  . Drug Use: No  . Sexual Activity: Not Currently   Other Topics Concern  . Not on file   Social History Narrative   Married   Works at [employer] and also as [employer] at the [employer]:    As stated in the HPI and negative for all other systems.  Physical Exam: Blood pressure 150/82, pulse 69, temperature 97.9 F (36.6 C), temperature source Oral, resp. rate 18, height 5\' 8"  (1.727 m), weight 279 lb (126.554 kg), SpO2 97 %.  GENERAL:  Well appearing HEENT:  Pupils equal round and reactive, fundi not visualized, oral mucosa unremarkable NECK:  No jugular venous distention, waveform within normal limits, carotid upstroke brisk and symmetric, no bruits, no thyromegaly LYMPHATICS:  No cervical, inguinal adenopathy LUNGS:  Clear to auscultation bilaterally BACK:  No CVA tenderness CHEST:  Unremarkable HEART:  PMI not displaced or sustained,S1 and S2 within normal limits, no S3, no S4, no clicks, no rubs, no murmurs ABD:  Flat, positive bowel sounds normal in frequency in pitch, no bruits, no rebound, no guarding, no midline pulsatile mass, no hepatomegaly, no splenomegaly, obese EXT:  2 plus pulses throughout, no edema, no cyanosis no clubbing SKIN:  No rashes  no nodules NEURO:  Cranial nerves II through XII grossly intact, motor grossly intact throughout PSYCH:  Cognitively intact, oriented to person place and time  Labs: Lab Results  Component Value Date   BUN 11 02/07/2016   Lab Results  Component Value Date   CREATININE 1.26* 02/07/2016   Lab Results  Component Value Date   NA 137 02/07/2016   K 3.9 02/07/2016   CL 103 02/07/2016   CO2 24 02/07/2016   Lab Results  Component Value Date   TROPONINI <0.01        NO INDICATION OF MYOCARDIAL INJURY. 12/14/2008   Lab Results  Component Value Date   WBC 4.7 02/07/2016   HGB 12.0* 02/07/2016   HCT 37.3* 02/07/2016   MCV 89.7 02/07/2016   PLT 186 02/07/2016    Lab Results  Component Value Date   ALT 161* 02/07/2016   AST 62* 02/07/2016   ALKPHOS 158* 02/07/2016   BILITOT 5.1* 02/07/2016    EKG:   Atrial paced rhythm.   LBBB  ASSESSMENT AND PLAN:   PREOP:   The patient has a very high functional level.  He does not have have any unstable symptoms.  He is not going for a high risk procedure.  Therefore, based on ACC/AHA guidelines, the patient would be at acceptable risk for the planned procedure without further cardiovascular testing.  ATRIAL FIB:    OK to hold Xarelto.  No need for bridging anticoagulation.  SignedMinus Breeding 02/07/2016, 4:18 PM

## 2016-02-08 LAB — CBC
HEMATOCRIT: 36.1 % — AB (ref 39.0–52.0)
HEMOGLOBIN: 11.5 g/dL — AB (ref 13.0–17.0)
MCH: 28.4 pg (ref 26.0–34.0)
MCHC: 31.9 g/dL (ref 30.0–36.0)
MCV: 89.1 fL (ref 78.0–100.0)
Platelets: 183 10*3/uL (ref 150–400)
RBC: 4.05 MIL/uL — AB (ref 4.22–5.81)
RDW: 14.3 % (ref 11.5–15.5)
WBC: 4.8 10*3/uL (ref 4.0–10.5)

## 2016-02-08 LAB — COMPREHENSIVE METABOLIC PANEL
ALT: 168 U/L — ABNORMAL HIGH (ref 17–63)
ANION GAP: 12 (ref 5–15)
AST: 106 U/L — AB (ref 15–41)
Albumin: 2.9 g/dL — ABNORMAL LOW (ref 3.5–5.0)
Alkaline Phosphatase: 168 U/L — ABNORMAL HIGH (ref 38–126)
BUN: 11 mg/dL (ref 6–20)
CHLORIDE: 105 mmol/L (ref 101–111)
CO2: 21 mmol/L — ABNORMAL LOW (ref 22–32)
Calcium: 8.8 mg/dL — ABNORMAL LOW (ref 8.9–10.3)
Creatinine, Ser: 1.24 mg/dL (ref 0.61–1.24)
GFR, EST NON AFRICAN AMERICAN: 58 mL/min — AB (ref >60)
Glucose, Bld: 277 mg/dL — ABNORMAL HIGH (ref 65–99)
POTASSIUM: 3.8 mmol/L (ref 3.5–5.1)
Sodium: 138 mmol/L (ref 135–145)
TOTAL PROTEIN: 6.6 g/dL (ref 6.5–8.1)
Total Bilirubin: 3 mg/dL — ABNORMAL HIGH (ref 0.3–1.2)

## 2016-02-08 LAB — HEPARIN LEVEL (UNFRACTIONATED): HEPARIN UNFRACTIONATED: 0.84 [IU]/mL — AB (ref 0.30–0.70)

## 2016-02-08 LAB — GLUCOSE, CAPILLARY
GLUCOSE-CAPILLARY: 207 mg/dL — AB (ref 65–99)
GLUCOSE-CAPILLARY: 184 mg/dL — AB (ref 65–99)
GLUCOSE-CAPILLARY: 215 mg/dL — AB (ref 65–99)
Glucose-Capillary: 162 mg/dL — ABNORMAL HIGH (ref 65–99)
Glucose-Capillary: 192 mg/dL — ABNORMAL HIGH (ref 65–99)

## 2016-02-08 LAB — APTT: aPTT: 35 seconds (ref 24–37)

## 2016-02-08 MED ORDER — CEFAZOLIN SODIUM-DEXTROSE 2-3 GM-% IV SOLR
2.0000 g | INTRAVENOUS | Status: AC
  Filled 2016-02-08 (×2): qty 50

## 2016-02-08 MED ORDER — INSULIN NPH (HUMAN) (ISOPHANE) 100 UNIT/ML ~~LOC~~ SUSP
20.0000 [IU] | Freq: Two times a day (BID) | SUBCUTANEOUS | Status: DC
  Administered 2016-02-08 – 2016-02-10 (×3): 20 [IU] via SUBCUTANEOUS
  Filled 2016-02-08: qty 10

## 2016-02-08 MED ORDER — DEXTROSE 5 % IV SOLN
INTRAVENOUS | Status: AC
  Administered 2016-02-09 (×2): via INTRAVENOUS

## 2016-02-08 NOTE — Progress Notes (Signed)
[facility] PROGRESS NOTE  Charles [name] [id number] DOB: December 27, 1945 DOA: 02/05/2016 PCP: [name], MD    70 year active ? [works in [place], 2 hr /day at airport] DM ty II CAD-Prior [place], Stent Placement 1998 and 2008, cath 2008, Myoview 2014 was LOW risk-LVEF 47% Afib s/p PPM CHad2Vasc2 score=4, On Xarelto abdominal pain, intermittent since last November  had Laparotomy + appy & 12 " bowel resected age 70, Prior Dysphagia Last colonoscopy 2007 Treated Hpylori 10/24/15 stg III CKD Controlled HTn DM   admitted for the evaluation of the above. GI consulted and he underwent endoscopic ultrasound showed normal UGI tract, but there were several small shadowing stone in the gall bladder.  There some GB thickening and CBD wall thickening. He was started on IV antibiotics.  HIDA scan was negative for acute cholecystitis. Liver function tests improving.  Gen surgery consulted Cardiology consulted for Peri-op risk eval  Assessment/Plan:  1. Intermittent epigastric pain of unclear etiology:-etiology GB stones.-note h/o recent [10/2015] rx H pylori Underwent EGD and endoscopic ultrasound and see the results above.  HIDA scan negative.  Liver function improving.  Further management as per GI/Gen surgery - continuing IV Cipro every 12 pending surgery  2. STAGE 3 CKD: Stopped HCTZ, and lisinopril, probably pre renal.  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   oral hypoglycemics metformin 100 qd on hold, Amaryl 4 bid on hold Sugars 210-236-- Continue NHP 40 bid---> drop dose  3/19 p.m. to 20 units as will be nothing by mouth for procedure and started D5 AT 50cc/hr at 0100 in anticipation of surgery on 3/20   OSA on CPAP at night. .    Consultants:  GI   Gen Surg  Cardiology Procedures:  Endoscopic Ultrasound  HIDA SCAN  Antibiotics:  cipro started on 3/17  HPI/Subjective:   no pain  ambulatory  passed stool  no nausea no vomiting  no blurred or double vision   ready for surgery  Objective: Filed Vitals:   02/07/16 2029 02/08/16 0453  BP: 163/82 158/84  Pulse: 70 70  Temp: 98.2 F (36.8 C) 97.8 F (36.6 C)  Resp: 19 19    Intake/Output Summary (Last 24 hours) at 02/08/16 1631 Last data filed at 02/08/16 1434  Gross per 24 hour  Intake    880 ml  Output   1053 ml  Net   -173 ml   Filed Weights   02/05/16 0106  Weight: 126.554 kg (279 lb)    Exam:   General:  Alert comfortable  Cardiovascular: s1s2  Respiratory: ctab, no wheezing or rhonchi  Abdomen: soft non tender non distended bowel sounds heard   Data Reviewed: Basic Metabolic Panel:  Recent Labs Lab 02/05/16 0119 02/06/16 0530 02/07/16 0700 02/08/16 0348  NA 137 137 137 138  K 4.8  3.5 3.9 3.8  CL 101 104 103 105  CO2 26 22 24  21*  GLUCOSE 254* 187* 225* 277*  BUN 18 19 11 11   CREATININE 1.25* 1.47* 1.26* 1.24  CALCIUM 9.6 8.6* 8.8* 8.8*   Liver Function Tests:  Recent Labs Lab 02/05/16 0119 02/05/16 0503 02/06/16 0530 02/07/16 0700 02/08/16 0348  AST 339*  --  92* 62* 106*  ALT 228*  --  207* 161* 168*  ALKPHOS 96  --  117 158* 168*  BILITOT 3.0* 4.4* 5.7* 5.1* 3.0*  PROT 7.5  --  6.4* 6.8 6.6  ALBUMIN 3.9  --  3.1* 3.1* 2.9*    Recent  Labs Lab 02/05/16 0119  LIPASE 33   No results for input(s): AMMONIA in the last 168 hours. CBC:  Recent Labs Lab 02/05/16 0119 02/06/16 0530 02/07/16 0700 02/08/16 0348  WBC 9.1 6.7 4.7 4.8  HGB 13.5 11.6* 12.0* 11.5*  HCT 41.4 35.6* 37.3* 36.1*  MCV 89.6 88.3 89.7 89.1  PLT 201 175 186 183   Cardiac Enzymes: No results for input(s): CKTOTAL, CKMB, CKMBINDEX, TROPONINI in the last 168 hours. BNP (last 3 results) No results for input(s): BNP in the last 8760 hours.  ProBNP (last 3 results)  Recent Labs  10/21/15 0932  PROBNP 364.0*    CBG:  Recent Labs Lab 02/07/16 1719 02/07/16 2144 02/08/16 0218 02/08/16 0730 02/08/16 1155  GLUCAP 207* 212* 207* 184* 215*    No results found for this or any previous visit (from the past 240 hour(s)).   Studies: Nm Hepatobiliary Including Gb  02/06/2016  CLINICAL DATA:  Upper abdominal pain and bloating with elevated LFTs EXAM: NUCLEAR MEDICINE HEPATOBILIARY IMAGING TECHNIQUE: Sequential images of the abdomen were obtained out to 60 minutes following intravenous administration of radiopharmaceutical. RADIOPHARMACEUTICALS:  Five mCi Tc-57m  Choletec IV COMPARISON:  02/05/2016 FINDINGS: There is adequate uptake of radioactive tracer throughout the liver. The biliary system is not well visualized although some minimal activity passes into the small bowel. Nonvisualization of the gallbladder is noted despite treatment with 5 mg of morphine. IMPRESSION: Normal uptake of biliary tracer with only minimal excretion into the biliary tree. Nonvisualization of the gallbladder is noted despite morphine therapy. Given the poor opacification of the biliary tree this is equivocal for cholecystitis although suggests a possible cholestasis pattern. Electronically Signed   By: Inez Catalina M.D.   On: 02/06/2016 18:25    Scheduled Meds: .  ceFAZolin (ANCEF) IV  2 g Intravenous To SS-Surg  . ciprofloxacin  400 mg Intravenous Q12H  . insulin aspart   0-20 Units Subcutaneous TID WC  . insulin aspart  0-5 Units Subcutaneous QHS  . insulin NPH Human  40 Units Subcutaneous BID AC & HS  . pantoprazole  40 mg Oral Daily  . sotalol  120 mg Oral BID   Continuous Infusions: . sodium chloride    . sodium chloride    . sodium chloride 75 mL/hr at 02/08/16 1028    Principal Problem:   Epigastric abdominal pain Active Problems:   Atrial fibrillation ([facility])   Long term (current) use of anticoagulants   OSA (obstructive sleep apnea)   Cholelithiasis   GERD (gastroesophageal reflux disease)   Diabetes mellitus type 2 in obese ([place])   Cholelithiases   Preop cardiovascular exam    Time spent: 30 minutes.     Verneita Griffes, MD [facility] [phone]

## 2016-02-08 NOTE — Progress Notes (Signed)
[facility] Progress Note    Since last GI note: General surgery and cardiology input appreciated.  Feels about the same, complaining of bloating and mild generalized abd pains.  Has not eaten since MN  Objective: Vital signs in last 24 hours: Temp:  [97.8 F (36.6 C)-98.2 F (36.8 C)] 97.8 F (36.6 C) (03/19 0453) Pulse Rate:  [69-70] 70 (03/19 0453) Resp:  [18-19] 19 (03/19 0453) BP: (150-163)/(71-84) 158/84 mmHg (03/19 0453) SpO2:  [97 %-98 %] 98 % (03/19 0453) Last BM Date: 02/07/16 General: alert and oriented times 3 Heart: regular rate and rythm Abdomen: soft,mildly tender throughout, non-distended, normal bowel sounds   Lab Results:  Recent Labs  02/06/16 0530 02/07/16 0700 02/08/16 0348  WBC 6.7 4.7 4.8  HGB 11.6* 12.0* 11.5*  PLT 175 186 183  MCV 88.3 89.7 89.1    Recent Labs  02/06/16 0530 02/07/16 0700 02/08/16 0348  NA 137 137 138  K 3.5 3.9 3.8  CL 104 103 105  CO2 22 24 21*  GLUCOSE 187* 225* 277*  BUN 19 11 11   CREATININE 1.47* 1.26* 1.24  CALCIUM 8.6* 8.8* 8.8*    Recent Labs  02/06/16 0530 02/07/16 0700 02/08/16 0348  PROT 6.4* 6.8 6.6  ALBUMIN 3.1* 3.1* 2.9*  AST 92* 62* 106*  ALT 207* 161* 168*  ALKPHOS 117 158* 168*  BILITOT 5.7* 5.1* 3.0*   Studies/Results: Nm Hepatobiliary Including Gb  02/06/2016  CLINICAL DATA:  Upper abdominal pain and bloating with elevated LFTs EXAM: NUCLEAR MEDICINE HEPATOBILIARY IMAGING TECHNIQUE: Sequential images of the abdomen were obtained out to 60 minutes following intravenous administration of radiopharmaceutical. RADIOPHARMACEUTICALS:  Five mCi Tc-105m  Choletec IV COMPARISON:  02/05/2016 FINDINGS: There is adequate uptake of radioactive tracer throughout the liver. The biliary system is not well visualized although some minimal activity passes into the small bowel. Nonvisualization of the gallbladder is noted despite treatment with 5 mg of morphine. IMPRESSION: Normal uptake of biliary  tracer with only minimal excretion into the biliary tree. Nonvisualization of the gallbladder is noted despite morphine therapy. Given the poor opacification of the biliary tree this is equivocal for cholecystitis although suggests a possible cholestasis pattern. Electronically Signed   By: Inez Catalina M.D.   On: 02/06/2016 18:25     Medications: Scheduled Meds: . ciprofloxacin  400 mg Intravenous Q12H  . insulin aspart  0-20 Units Subcutaneous TID WC  . insulin aspart  0-5 Units Subcutaneous QHS  . insulin NPH Human  40 Units Subcutaneous BID AC & HS  . pantoprazole  40 mg Oral Daily  . sotalol  120 mg Oral BID   Continuous Infusions: . sodium chloride    . sodium chloride    . sodium chloride 75 mL/hr at 02/08/16 0141  . heparin 1,700 Units/hr (02/08/16 0141)   PRN Meds:.acetaminophen **OR** acetaminophen, bisacodyl, HYDROcodone-acetaminophen, morphine injection, nitroGLYCERIN, ondansetron **OR** ondansetron (ZOFRAN) IV, technetium TC 45M mebrofenin    Assessment/Plan: 70 y.o. male with abd pains, elevated liver tests  General surgery agreed that this MAY be symptomatic gallstone disease.  Cardiology consultation describes that he is not at high risk for lap surgery and did not recommend any further cardiology testing pre-op.  He was put on heparin.  I will make officially NPO for now, has not eaten since before MN, just in case it is possible that he have surgery today.      [name], MD  02/08/2016, 7:09 AM [facility] Pager [phone]

## 2016-02-08 NOTE — Progress Notes (Signed)
ANTICOAGULATION CONSULT NOTE - Follow Up Consult  Pharmacy Consult for Heparin (holding Xarelto) Indication: atrial fibrillation  Allergies  Allergen Reactions  . Chocolate Hives, Shortness Of Breath and Swelling  . Statins Other (See Comments)    Mental changes, muscle aches  . Codeine     Itching  . Oxytetracycline     Flushing in sunlight    Patient Measurements: Height: 5\' 8"  (172.7 cm) Weight: 279 lb (126.554 kg) IBW/kg (Calculated) : 68.4  Vital Signs: Temp: 98.2 F (36.8 C) (03/18 2029) Temp Source: Oral (03/18 2029) BP: 163/82 mmHg (03/18 2029) Pulse Rate: 70 (03/18 2029)  Labs:  Recent Labs  02/06/16 0530 02/07/16 0700 02/07/16 1212 02/07/16 2356  HGB 11.6* 12.0*  --   --   HCT 35.6* 37.3*  --   --   PLT 175 186  --   --   APTT  --   --  35 35  HEPARINUNFRC  --   --  1.86* 0.84*  CREATININE 1.47* 1.26*  --   --     Estimated Creatinine Clearance: 71.8 mL/min (by C-G formula based on Cr of 1.26).  Assessment: Sub-therapeutic aPTT this AM, HL is still elevated due to Xarelto influence, will continue to use aPTT to dose until they correlate. No issues per RN.   Goal of Therapy:  Heparin level 0.3-0.7 units/ml aPTT 66-102 seconds Monitor platelets by anticoagulation protocol: Yes   Plan:  -Inc heparin to 1700 units/hr -1000 aPTT/HL  Narda Bonds 02/08/2016,1:33 AM

## 2016-02-08 NOTE — Progress Notes (Signed)
2 Days Post-Op  Subjective: Comfortable today Denies pain  Objective: Vital signs in last 24 hours: Temp:  [97.8 F (36.6 C)-98.2 F (36.8 C)] 97.8 F (36.6 C) (03/19 0453) Pulse Rate:  [69-70] 70 (03/19 0453) Resp:  [18-19] 19 (03/19 0453) BP: (150-163)/(71-84) 158/84 mmHg (03/19 0453) SpO2:  [97 %-98 %] 98 % (03/19 0453) Last BM Date: 02/07/16  Intake/Output from previous day: 03/18 0701 - 03/19 0700 In: -  Out: 601 [Urine:600; Stool:1] Intake/Output this shift:    Abdomen soft with almost no tenderness  Lab Results:   Recent Labs  02/07/16 0700 02/08/16 0348  WBC 4.7 4.8  HGB 12.0* 11.5*  HCT 37.3* 36.1*  PLT 186 183   BMET  Recent Labs  02/07/16 0700 02/08/16 0348  NA 137 138  K 3.9 3.8  CL 103 105  CO2 24 21*  GLUCOSE 225* 277*  BUN 11 11  CREATININE 1.26* 1.24  CALCIUM 8.8* 8.8*   PT/INR No results for input(s): LABPROT, INR in the last 72 hours. ABG No results for input(s): PHART, HCO3 in the last 72 hours.  Invalid input(s): PCO2, PO2  Studies/Results: Nm Hepatobiliary Including Gb  02/06/2016  CLINICAL DATA:  Upper abdominal pain and bloating with elevated LFTs EXAM: NUCLEAR MEDICINE HEPATOBILIARY IMAGING TECHNIQUE: Sequential images of the abdomen were obtained out to 60 minutes following intravenous administration of radiopharmaceutical. RADIOPHARMACEUTICALS:  Five mCi Tc-55m  Choletec IV COMPARISON:  02/05/2016 FINDINGS: There is adequate uptake of radioactive tracer throughout the liver. The biliary system is not well visualized although some minimal activity passes into the small bowel. Nonvisualization of the gallbladder is noted despite treatment with 5 mg of morphine. IMPRESSION: Normal uptake of biliary tracer with only minimal excretion into the biliary tree. Nonvisualization of the gallbladder is noted despite morphine therapy. Given the poor opacification of the biliary tree this is equivocal for cholecystitis although suggests a  possible cholestasis pattern. Electronically Signed   By: Inez Catalina M.D.   On: 02/06/2016 18:25    Anti-infectives: Anti-infectives    Start     Dose/Rate Route Frequency Ordered Stop   02/06/16 1400  ciprofloxacin (CIPRO) IVPB 400 mg     400 mg 200 mL/hr over 60 Minutes Intravenous Every 12 hours 02/06/16 1227        Assessment/Plan: s/p Procedure(s): UPPER ENDOSCOPIC ULTRASOUND (EUS) RADIAL (N/A)  Symptomatic cholelithiasis  Will plan on lap chole tomorrow.  Will resume diet and make him NPO at midnight.  Will also stop heparin as cardiology said patient does not need to be bridged. I discussed the procedure in detail. We discussed the risks and benefits of a laparoscopic cholecystectomy and cholangiogram including, but not limited to bleeding, infection, injury to surrounding structures such as the intestine or liver, bile leak, retained gallstones, need to convert to an open procedure, prolonged diarrhea, blood clots such as  DVT, common bile duct injury, anesthesia risks, and possible need for additional procedures.  The likelihood of improvement in symptoms and return to the patient's normal status is good. We discussed the typical post-operative recovery course.  LOS: 1 day    Patches Mcdonnell A 02/08/2016

## 2016-02-09 ENCOUNTER — Encounter (HOSPITAL_COMMUNITY)
Admission: EM | Disposition: A | Discharge status: Home / Self Care | Payer: Self-pay | Source: Home / Self Care | Attending: Family Medicine

## 2016-02-09 ENCOUNTER — Other Ambulatory Visit (HOSPITAL_COMMUNITY): Payer: BLUE CROSS/BLUE SHIELD

## 2016-02-09 ENCOUNTER — Inpatient Hospital Stay (HOSPITAL_COMMUNITY): Payer: BLUE CROSS/BLUE SHIELD | Admitting: Anesthesiology

## 2016-02-09 ENCOUNTER — Encounter (HOSPITAL_COMMUNITY): Payer: Self-pay | Admitting: Certified Registered Nurse Anesthetist

## 2016-02-09 HISTORY — PX: CHOLECYSTECTOMY: SHX55

## 2016-02-09 LAB — GLUCOSE, CAPILLARY
GLUCOSE-CAPILLARY: 123 mg/dL — AB (ref 65–99)
GLUCOSE-CAPILLARY: 175 mg/dL — AB (ref 65–99)
GLUCOSE-CAPILLARY: 231 mg/dL — AB (ref 65–99)
GLUCOSE-CAPILLARY: 229 mg/dL — AB (ref 65–99)
Glucose-Capillary: 195 mg/dL — ABNORMAL HIGH (ref 65–99)

## 2016-02-09 LAB — CBC WITH DIFFERENTIAL/PLATELET
BASOS ABS: 0 10*3/uL (ref 0.0–0.1)
Basophils Relative: 0 %
EOS ABS: 0.2 10*3/uL (ref 0.0–0.7)
EOS PCT: 3 %
HCT: 36.2 % — ABNORMAL LOW (ref 39.0–52.0)
Hemoglobin: 11.7 g/dL — ABNORMAL LOW (ref 13.0–17.0)
LYMPHS ABS: 1.5 10*3/uL (ref 0.7–4.0)
LYMPHS PCT: 29 %
MCH: 29 pg (ref 26.0–34.0)
MCHC: 32.3 g/dL (ref 30.0–36.0)
MCV: 89.6 fL (ref 78.0–100.0)
MONO ABS: 0.4 10*3/uL (ref 0.1–1.0)
Monocytes Relative: 8 %
Neutro Abs: 3 10*3/uL (ref 1.7–7.7)
Neutrophils Relative %: 59 %
PLATELETS: 205 10*3/uL (ref 150–400)
RBC: 4.04 MIL/uL — ABNORMAL LOW (ref 4.22–5.81)
RDW: 14.3 % (ref 11.5–15.5)
WBC: 5.1 10*3/uL (ref 4.0–10.5)

## 2016-02-09 LAB — SURGICAL PCR SCREEN
MRSA, PCR: NEGATIVE
Staphylococcus aureus: NEGATIVE

## 2016-02-09 LAB — PROTIME-INR
INR: 1.17 (ref 0.00–1.49)
Prothrombin Time: 15.1 seconds (ref 11.6–15.2)

## 2016-02-09 SURGERY — LAPAROSCOPIC CHOLECYSTECTOMY
Anesthesia: General | Site: Abdomen

## 2016-02-09 MED ORDER — HYDROMORPHONE HCL 1 MG/ML IJ SOLN
INTRAMUSCULAR | Status: AC
  Filled 2016-02-09: qty 1

## 2016-02-09 MED ORDER — SUGAMMADEX SODIUM 500 MG/5ML IV SOLN
INTRAVENOUS | Status: AC
  Filled 2016-02-09: qty 5

## 2016-02-09 MED ORDER — ONDANSETRON HCL 4 MG/2ML IJ SOLN
INTRAMUSCULAR | Status: AC
  Filled 2016-02-09: qty 2

## 2016-02-09 MED ORDER — HYDROCODONE-ACETAMINOPHEN 5-325 MG PO TABS
1.0000 | ORAL_TABLET | ORAL | Status: DC | PRN
  Administered 2016-02-10: 2 via ORAL
  Filled 2016-02-09: qty 2

## 2016-02-09 MED ORDER — ACETAMINOPHEN 325 MG PO TABS: 325.0000 mg | ORAL_TABLET | ORAL | Status: DC | PRN

## 2016-02-09 MED ORDER — ONDANSETRON HCL 4 MG/2ML IJ SOLN
INTRAMUSCULAR | Status: DC | PRN
  Administered 2016-02-09: 4 mg via INTRAVENOUS

## 2016-02-09 MED ORDER — OXYCODONE HCL 5 MG PO TABS: 5.0000 mg | ORAL_TABLET | Freq: Once | ORAL | Status: DC | PRN

## 2016-02-09 MED ORDER — ROCURONIUM BROMIDE 50 MG/5ML IV SOLN
INTRAVENOUS | Status: AC
  Filled 2016-02-09: qty 1

## 2016-02-09 MED ORDER — BUPIVACAINE-EPINEPHRINE (PF) 0.25% -1:200000 IJ SOLN
INTRAMUSCULAR | Status: AC
  Filled 2016-02-09: qty 30

## 2016-02-09 MED ORDER — MORPHINE SULFATE (PF) 2 MG/ML IV SOLN
1.0000 mg | INTRAVENOUS | Status: DC | PRN
  Administered 2016-02-09 (×4): 4 mg via INTRAVENOUS
  Filled 2016-02-09 (×4): qty 2

## 2016-02-09 MED ORDER — FENTANYL CITRATE (PF) 100 MCG/2ML IJ SOLN
INTRAMUSCULAR | Status: DC | PRN
  Administered 2016-02-09: 150 ug via INTRAVENOUS
  Administered 2016-02-09: 100 ug via INTRAVENOUS

## 2016-02-09 MED ORDER — SUCCINYLCHOLINE CHLORIDE 20 MG/ML IJ SOLN
INTRAMUSCULAR | Status: DC | PRN
  Administered 2016-02-09: 100 mg via INTRAVENOUS

## 2016-02-09 MED ORDER — LACTATED RINGERS IV SOLN
INTRAVENOUS | Status: DC | PRN
  Administered 2016-02-09: 08:00:00 via INTRAVENOUS

## 2016-02-09 MED ORDER — ACETAMINOPHEN 160 MG/5ML PO SOLN: 325.0000 mg | ORAL | Status: DC | PRN

## 2016-02-09 MED ORDER — FENTANYL CITRATE (PF) 100 MCG/2ML IJ SOLN
INTRAMUSCULAR | Status: AC
  Filled 2016-02-09: qty 2

## 2016-02-09 MED ORDER — PHENYLEPHRINE HCL 10 MG/ML IJ SOLN
INTRAMUSCULAR | Status: DC | PRN
  Administered 2016-02-09: 80 ug via INTRAVENOUS

## 2016-02-09 MED ORDER — ROCURONIUM BROMIDE 100 MG/10ML IV SOLN
INTRAVENOUS | Status: DC | PRN
  Administered 2016-02-09: 40 mg via INTRAVENOUS
  Administered 2016-02-09: 10 mg via INTRAVENOUS

## 2016-02-09 MED ORDER — PROPOFOL 10 MG/ML IV BOLUS
INTRAVENOUS | Status: AC
  Filled 2016-02-09: qty 20

## 2016-02-09 MED ORDER — FENTANYL CITRATE (PF) 100 MCG/2ML IJ SOLN
25.0000 ug | INTRAMUSCULAR | Status: DC | PRN
  Administered 2016-02-09 (×3): 50 ug via INTRAVENOUS

## 2016-02-09 MED ORDER — OXYCODONE HCL 5 MG/5ML PO SOLN: 5.0000 mg | Freq: Once | ORAL | Status: DC | PRN

## 2016-02-09 MED ORDER — FENTANYL CITRATE (PF) 250 MCG/5ML IJ SOLN
INTRAMUSCULAR | Status: AC
  Filled 2016-02-09: qty 5

## 2016-02-09 MED ORDER — SODIUM CHLORIDE 0.9 % IR SOLN
Status: DC | PRN
  Administered 2016-02-09: 1

## 2016-02-09 MED ORDER — LIDOCAINE HCL (CARDIAC) 20 MG/ML IV SOLN
INTRAVENOUS | Status: AC
  Filled 2016-02-09: qty 5

## 2016-02-09 MED ORDER — LIDOCAINE HCL (CARDIAC) 20 MG/ML IV SOLN
INTRAVENOUS | Status: DC | PRN
  Administered 2016-02-09: 60 mg via INTRAVENOUS

## 2016-02-09 MED ORDER — CEFAZOLIN SODIUM-DEXTROSE 2-3 GM-% IV SOLR
INTRAVENOUS | Status: DC | PRN
  Administered 2016-02-09: 2 g via INTRAVENOUS

## 2016-02-09 MED ORDER — PROPOFOL 10 MG/ML IV BOLUS
INTRAVENOUS | Status: DC | PRN
  Administered 2016-02-09: 100 mg via INTRAVENOUS
  Administered 2016-02-09: 60 mg via INTRAVENOUS

## 2016-02-09 MED ORDER — SUCCINYLCHOLINE CHLORIDE 20 MG/ML IJ SOLN
INTRAMUSCULAR | Status: AC
  Filled 2016-02-09: qty 1

## 2016-02-09 MED ORDER — BUPIVACAINE-EPINEPHRINE 0.25% -1:200000 IJ SOLN
INTRAMUSCULAR | Status: DC | PRN
  Administered 2016-02-09: 20 mL

## 2016-02-09 MED ORDER — 0.9 % SODIUM CHLORIDE (POUR BTL) OPTIME
TOPICAL | Status: DC | PRN
  Administered 2016-02-09: 1000 mL

## 2016-02-09 MED ORDER — HYDROMORPHONE HCL 1 MG/ML IJ SOLN
0.5000 mg | INTRAMUSCULAR | Status: AC | PRN
  Administered 2016-02-09 (×4): 0.5 mg via INTRAVENOUS

## 2016-02-09 MED ORDER — SUGAMMADEX SODIUM 500 MG/5ML IV SOLN
INTRAVENOUS | Status: DC | PRN
  Administered 2016-02-09: 300 mg via INTRAVENOUS

## 2016-02-09 SURGICAL SUPPLY — 41 items
APPLIER CLIP 5 13 M/L LIGAMAX5 (MISCELLANEOUS) ×6
BLADE SURG CLIPPER 3M 9600 (MISCELLANEOUS) ×3 IMPLANT
CANISTER SUCTION 2500CC (MISCELLANEOUS) ×3 IMPLANT
CHLORAPREP W/TINT 26ML (MISCELLANEOUS) ×3 IMPLANT
CLIP APPLIE 5 13 M/L LIGAMAX5 (MISCELLANEOUS) ×4 IMPLANT
COVER MAYO STAND STRL (DRAPES) ×3 IMPLANT
COVER SURGICAL LIGHT HANDLE (MISCELLANEOUS) ×3 IMPLANT
DRAIN CHANNEL 19F RND (DRAIN) ×3 IMPLANT
DRAPE C-ARM 42X72 X-RAY (DRAPES) ×3 IMPLANT
ELECT REM PT RETURN 9FT ADLT (ELECTROSURGICAL) ×3
ELECTRODE REM PT RTRN 9FT ADLT (ELECTROSURGICAL) ×2 IMPLANT
EVACUATOR SILICONE 100CC (DRAIN) ×3 IMPLANT
GLOVE BIOGEL PI IND STRL 7.0 (GLOVE) ×6 IMPLANT
GLOVE BIOGEL PI INDICATOR 7.0 (GLOVE) ×3
GLOVE SURG SIGNA 7.5 PF LTX (GLOVE) ×3 IMPLANT
GLOVE SURG SS PI 7.0 STRL IVOR (GLOVE) ×9 IMPLANT
GOWN STRL REUS W/ TWL LRG LVL3 (GOWN DISPOSABLE) ×6 IMPLANT
GOWN STRL REUS W/ TWL XL LVL3 (GOWN DISPOSABLE) ×2 IMPLANT
GOWN STRL REUS W/TWL LRG LVL3 (GOWN DISPOSABLE) ×3
GOWN STRL REUS W/TWL XL LVL3 (GOWN DISPOSABLE) ×1
KIT BASIN OR (CUSTOM PROCEDURE TRAY) ×3 IMPLANT
KIT ROOM TURNOVER OR (KITS) ×3 IMPLANT
LIQUID BAND (GAUZE/BANDAGES/DRESSINGS) ×3 IMPLANT
NS IRRIG 1000ML POUR BTL (IV SOLUTION) ×3 IMPLANT
PAD ARMBOARD 7.5X6 YLW CONV (MISCELLANEOUS) ×3 IMPLANT
POUCH SPECIMEN RETRIEVAL 10MM (ENDOMECHANICALS) ×3 IMPLANT
SCISSORS LAP 5X35 DISP (ENDOMECHANICALS) ×3 IMPLANT
SET CHOLANGIOGRAPH 5 50 .035 (SET/KITS/TRAYS/PACK) ×3 IMPLANT
SET IRRIG TUBING LAPAROSCOPIC (IRRIGATION / IRRIGATOR) ×3 IMPLANT
SLEEVE ENDOPATH XCEL 5M (ENDOMECHANICALS) ×9 IMPLANT
SPECIMEN JAR SMALL (MISCELLANEOUS) ×3 IMPLANT
SPONGE GAUZE 4X4 12PLY STER LF (GAUZE/BANDAGES/DRESSINGS) ×3 IMPLANT
SUT ETHILON 2 0 FS 18 (SUTURE) ×3 IMPLANT
SUT MON AB 4-0 PC3 18 (SUTURE) ×3 IMPLANT
TAPE CLOTH SURG 4X10 WHT LF (GAUZE/BANDAGES/DRESSINGS) ×3 IMPLANT
TOWEL OR 17X24 6PK STRL BLUE (TOWEL DISPOSABLE) ×3 IMPLANT
TOWEL OR 17X26 10 PK STRL BLUE (TOWEL DISPOSABLE) ×3 IMPLANT
TRAY LAPAROSCOPIC MC (CUSTOM PROCEDURE TRAY) ×3 IMPLANT
TROCAR XCEL BLUNT TIP 100MML (ENDOMECHANICALS) ×3 IMPLANT
TROCAR XCEL NON-BLD 5MMX100MML (ENDOMECHANICALS) ×3 IMPLANT
TUBING INSUFFLATION (TUBING) ×3 IMPLANT

## 2016-02-09 NOTE — Op Note (Signed)
NAME:  Charles [name], Charles [name] NO.:  [id number]  MEDICAL RECORD NO.:  [id number]  LOCATION:  [facility]                         FACILITY:  [facility]  PHYSICIAN:  Coralie Keens, M.D. DATE OF BIRTH:  10/26/1946  DATE OF PROCEDURE:  02/09/2016 DATE OF DISCHARGE:                              OPERATIVE REPORT   PREOPERATIVE DIAGNOSIS:  Symptomatic cholelithiasis.  POSTOPERATIVE DIAGNOSIS:  Chronic cholecystitis with cholelithiasis.  PROCEDURE:  Laparoscopic cholecystectomy.  SURGEON:  Dr. Coralie Keens.  ANESTHESIA:  General and 0.5% Marcaine.  ESTIMATED BLOOD LOSS:  Minimal.  INDICATIONS:  This is a 70 year old gentleman, who is admitted with right upper quadrant abdominal pain.  He had a workup by Gastroenterology and was found to have gallstones.  A HIDA scan showed nonfilling of the gallbladder, but also cholestasis.  He had elevated liver function tests, which were slowly decreasing.  His ultrasound showed a normal bile duct.  The decision was made to proceed to the operating room for cholecystectomy with possible cholangiogram.  FINDINGS:  The patient was found to have gallbladder consistent with chronic cholecystitis, which was very friable.  As soon as the cystic duct was identified that completely tore.  I was thus unable to do a cholangiogram.  I was able to find the cystic stump and placed clips on. A drain was therefore left in place.  PROCEDURE IN DETAIL:  The patient was brought to the operating room, identified as Charles [name].  He was placed on the operating table. General anesthesia was induced.  His abdomen was then prepped and draped in usual sterile fashion.  I made a small vertical incision at the upper edge of the umbilicus.  I carried this down to a small umbilical hernia. I was able to open up the hernia sac and then opened the fascia slightly larger to gain entrance into the abdominal cavity under direct vision. I then placed a 0 Vicryl  pursestring suture around the fascial opening. The Hasson port was placed through the opening and insufflation of the abdomen was begun.  I placed another 5-mm port in the patient's epigastrium to the right upper quadrant all under direct vision.  The patient was morbidly obese with a very large omentum and fatty liver.  I was able to grasp the gallbladder and retracted it above the liver bed. The gallbladder is only tore leaking bile.  I was able to then grasp that at the base.  I dissected out the cystic duct easily, but soon it was dissected completely tore.  At this point, I was able to grab it 1 time and clipped with clip, scissored, and towards the cystic duct even more.  I was again able to find the stump and then placed 2 clips on it, which appears secured.  I was then able to identify the cystic artery and posterior branch were clipped and transected as well.  At this point, it was not possible to do a cholangiogram.  I then slowly dissected the gallbladder free from the liver bed.  Once this free from liver bed, hemostasis was achieved with the cautery.  I placed the gallbladder in an Endosac and removed it through the incision at the umbilicus.  I had to place an extra 5 mm trocar in order to retract the omentum out of the dissecting area.  At this point, I then placed a 66- [place] Blake drain through the epigastric port and pulled it out the most lateral side port.  I then placed this into the gallbladder fossa. This was secured to the skin with a nylon suture.  Again, hemostasis appeared to be achieved.  I copiously irrigated the abdomen with normal saline.  All ports were removed under direct vision.  The abdomen was deflated.  With 0 Vicryl the umbilicus was tied in place closing the fascial defect.  All incisions were anesthetized with Marcaine and closed with 4-0 Monocryl subcuticular sutures.  Skin glue was then applied.  The patient tolerated the procedure well.  All the  counts were correct at the end of procedure.  The patient was then extubated in the operating room and taken in a stable condition to the recovery room.     Coralie Keens, M.D.     DB/MEDQ  D:  02/09/2016  T:  02/09/2016  Job:  [id number]

## 2016-02-09 NOTE — Anesthesia Preprocedure Evaluation (Signed)
Anesthesia Evaluation  Patient identified by MRN, date of birth, ID band Patient awake    Reviewed: Allergy & Precautions, NPO status , Patient's Chart, lab work & pertinent test results  History of Anesthesia Complications (+) history of anesthetic complications  Airway Mallampati: IV  TM Distance: >3 FB Neck ROM: Full    Dental  (+) Teeth Intact, Chipped,    Pulmonary sleep apnea and Continuous Positive Airway Pressure Ventilation , former smoker,    breath sounds clear to auscultation       Cardiovascular hypertension, Pt. on medications and Pt. on home beta blockers + CAD, + Past MI, + Cardiac Stents and + Peripheral Vascular Disease  + dysrhythmias Atrial Fibrillation + pacemaker  Rhythm:Regular     Neuro/Psych negative neurological ROS  negative psych ROS   GI/Hepatic Neg liver ROS, GERD  ,  Endo/Other  diabetes, Type 2, Oral Hypoglycemic AgentsMorbid obesity  Renal/GU Renal disease     Musculoskeletal   Abdominal   Peds  Hematology  (+) anemia ,   Anesthesia Other Findings   Reproductive/Obstetrics                             Anesthesia Physical Anesthesia Plan  ASA: III  Anesthesia Plan: General   Post-op Pain Management:    Induction: Intravenous  Airway Management Planned: Oral ETT  Additional Equipment: None  Intra-op Plan:   Post-operative Plan: Extubation in OR  Informed Consent: I have reviewed the patients History and Physical, chart, labs and discussed the procedure including the risks, benefits and alternatives for the proposed anesthesia with the patient or authorized representative who has indicated his/her understanding and acceptance.   Dental advisory given  Plan Discussed with: CRNA and Surgeon  Anesthesia Plan Comments:         Anesthesia Quick Evaluation

## 2016-02-09 NOTE — Progress Notes (Signed)
Pt. placed on CPAP in Auto Mode @ <7/14> on room air per home regimen with small nassal mask,tolerating well, RT to monitor.

## 2016-02-09 NOTE — Progress Notes (Signed)
Report called to OR  

## 2016-02-09 NOTE — Anesthesia Procedure Notes (Signed)
Procedure Name: Intubation Date/Time: 02/09/2016 8:26 AM Performed by: Salli Quarry Kirubel Aja Pre-anesthesia Checklist: Patient identified, Emergency Drugs available, Suction available and Patient being monitored Patient Re-evaluated:Patient Re-evaluated prior to inductionOxygen Delivery Method: Circle system utilized Preoxygenation: Pre-oxygenation with 100% oxygen Intubation Type: IV induction Ventilation: Mask ventilation without difficulty Laryngoscope Size: Mac and 4 Grade View: Grade IV Tube type: Oral Tube size: 8.0 mm Number of attempts: 1 Airway Equipment and Method: Stylet Placement Confirmation: ETT inserted through vocal cords under direct vision,  positive ETCO2 and breath sounds checked- equal and bilateral Secured at: 23 cm Tube secured with: Tape Dental Injury: Teeth and Oropharynx as per pre-operative assessment

## 2016-02-09 NOTE — Transfer of Care (Signed)
Immediate Anesthesia Transfer of Care Note  Patient: Charles [name]  Procedure(s) Performed: Procedure(s): LAPAROSCOPIC CHOLECYSTECTOMY  Patient Location: PACU  Anesthesia Type:General  Level of Consciousness: awake, alert , oriented and patient cooperative  Airway & Oxygen Therapy: Patient Spontanous Breathing and Patient connected to nasal cannula oxygen  Post-op Assessment: Report given to RN and Post -op Vital signs reviewed and stable  Post vital signs: Reviewed and stable  Last Vitals:  Filed Vitals:   02/09/16 0429 02/09/16 0733  BP: 149/73 167/85  Pulse: 70 68  Temp: 36.6 C 36.4 C  Resp: 18 20    Complications: No apparent anesthesia complications

## 2016-02-09 NOTE — Op Note (Signed)
LAPAROSCOPIC CHOLECYSTECTOMY  Procedure Note  DALE ZBOROWSKI 02/05/2016 - 02/09/2016   Pre-op Diagnosis: symptomatic cholelithiasis     Post-op Diagnosis: chronic cholecystitis with cholelithiasis  Procedure(s): LAPAROSCOPIC CHOLECYSTECTOMY  Surgeon(s): Coralie Keens, MD  Anesthesia: General  Staff:  Circulator: Cyd Silence, RN Scrub Person: Cindie Laroche; Vickki Muff; Rudean Curt, RN; Jim Like Leggio  Estimated Blood Loss: Minimal               Specimens: sent to path  Findings:  Very friable gallbladder.  Cystic duct tore with almost no tension.  Was able to find and clip the cystic duct but could not do a cholangiogram          Mavery Milling A   Date: 02/09/2016  Time: 9:19 AM

## 2016-02-09 NOTE — Progress Notes (Signed)
[facility] PROGRESS NOTE  GEORGIE SHAMBACH [id number] DOB: 02-Jul-1946 DOA: 02/05/2016 PCP: [name], MD    70 year active ? [works in [place], 2 hr [place]] DM ty II CAD-Prior [place], Stent Placement 1998 and 2008, cath 2008, Myoview 2014 was LOW risk-LVEF 47% Afib s/p PPM CHad2Vasc2 score=4, On Xarelto abdominal pain, intermittent since last November  had Laparotomy + appy & 12 " bowel resected age 70, Prior Dysphagia Last colonoscopy 2007 Treated Hpylori 10/24/15 stg III CKD Controlled HTn DM   admitted for the evaluation of the above. GI consulted and he underwent endoscopic ultrasound showed normal UGI tract, but there were several small shadowing stone in the gall bladder.  There some GB thickening and CBD wall thickening. He was started on IV antibiotics.  HIDA scan was negative for acute cholecystitis. Liver function tests improving.  Gen surgery consulted Cardiology consulted for Peri-op risk eval  Assessment/Plan:  1. Intermittent epigastric pain of unclear etiology:-etiology GB stones.-note h/o recent [10/2015] rx H pylori Underwent EGD and endoscopic ultrasound and see the results above.  HIDA scan negative.  Liver function improving.  Further management as per GI/Gen surgery - continuing IV Cipro every 12.   -Would continue antibiotics as per recommendation from general surgery S/p Lap Chole 3/20-note some damage to cystic duct per Dr. Ninfa Linden.   -Monitor am LFT -Dr. Ninfa Linden will involve GI if prn based on am LFTs Continue D5 12 hours ending 3/20, held saline  2. STAGE 3 CKD: Stopped HCTZ, and lisinopril, probably pre renal.  creatinine is improved with hydration  Saline locked 3/18  Hypertension: Controlled on sotolol monotherapy hctz 25 on hold Lisinopril 40 on hold  CAD-extensive h/o as above  no antiplatelet agent in addition to Xarelto to be resumed upon discharge  resume after surgery as per general surgery  Atrial  fib s/p PPM Rate controlled, changed this am to IV heparin as planning for eventual surgery  Resume Xarelto as per general surgery for now continue heparin   reflux  continue pantoprazole 40 daily  Diabetes mellitus: Resume SSI.   oral hypoglycemics metformin 100 qd on hold, Amaryl 4 bid on hold Sugars 123-195 Continue NHP 40 bid---> drop dose  3/19 p.m. to 20 units as will be nothing by mouth for procedure and started D5 AT 50cc/hr at 0100 in anticipation of surgery on 3/20 Tolerating clears so just keep D5 50 cc per hour overnight and recheck LFTs in a.m.   OSA on CPAP at night. .    Consultants:  GI   Gen Surg  Cardiology  Procedures:  Endoscopic Ultrasound  HIDA SCAN  Laparoscopic cholecystectomy 3/20 Dr. Ninfa Linden  Antibiotics:  cipro started on 3/17  HPI/Subjective:   Doing fair no nausea vomiting Tolerating diet fairly pain is 7/10 ambulated with some discomfort and was slow but managed No chest pain no fever No chills No nausea no vomiting at present   Objective: Filed Vitals:   02/09/16 1130 02/09/16 1200  BP:  163/85  Pulse: 70 69  Temp:  97.8 F (36.6 C)  Resp: 15     Intake/Output Summary (Last 24 hours) at 02/09/16 1655 Last data filed at 02/09/16 1405  Gross per 24 hour  Intake   1490 ml  Output    596 ml  Net    894 ml   Filed Weights   02/05/16 0106  Weight: 126.554 kg (279 lb)    Exam:   General:  Alert comfortable  Cardiovascular:  s1s2  Respiratory: ctab, no wheezing or rhonchi  Abdomen:  right upper quadrant somewhat tender soft non tender non distended bowel sounds heard    Data Reviewed: Basic Metabolic Panel:  Recent Labs Lab 02/05/16 0119 02/06/16 0530 02/07/16 0700 02/08/16 0348  NA 137 137 137 138  K 4.8 3.5 3.9 3.8  CL 101 104 103 105  CO2 26 22 24  21*  GLUCOSE 254* 187* 225* 277*  BUN 18 19 11 11   CREATININE 1.25* 1.47* 1.26* 1.24  CALCIUM 9.6 8.6* 8.8* 8.8*   Liver Function Tests:  Recent  Labs Lab 02/05/16 0119 02/05/16 0503 02/06/16 0530 02/07/16 0700 02/08/16 0348  AST 339*  --  92* 62* 106*  ALT 228*  --  207* 161* 168*  ALKPHOS 96  --  117 158* 168*  BILITOT 3.0* 4.4* 5.7* 5.1* 3.0*  PROT 7.5  --  6.4* 6.8 6.6  ALBUMIN 3.9  --  3.1* 3.1* 2.9*    Recent Labs Lab 02/05/16 0119  LIPASE 33   No results for input(s): AMMONIA in the last 168 hours. CBC:  Recent Labs Lab 02/05/16 0119 02/06/16 0530 02/07/16 0700 02/08/16 0348 02/09/16 0424  WBC 9.1 6.7 4.7 4.8 5.1  NEUTROABS  --   --   --   --  3.0  HGB 13.5 11.6* 12.0* 11.5* 11.7*  HCT 41.4 35.6* 37.3* 36.1* 36.2*  MCV 89.6 88.3 89.7 89.1 89.6  PLT 201 175 186 183 205   Cardiac Enzymes: No results for input(s): CKTOTAL, CKMB, CKMBINDEX, TROPONINI in the last 168 hours. BNP (last 3 results) No results for input(s): BNP in the last 8760 hours.  ProBNP (last 3 results)  Recent Labs  10/21/15 0932  PROBNP 364.0*    CBG:  Recent Labs Lab 02/08/16 1636 02/08/16 2253 02/09/16 0729 02/09/16 0939 02/09/16 1205  GLUCAP 162* 192* 123* 175* 195*    Recent Results (from the past 240 hour(s))  Surgical pcr screen     Status: None   Collection Time: 02/09/16  7:08 AM  Result Value Ref Range Status   MRSA, PCR NEGATIVE NEGATIVE Final   Staphylococcus aureus NEGATIVE NEGATIVE Final    Comment:        The Xpert SA Assay (FDA approved for NASAL specimens in patients over 15 years of age), is one component of a comprehensive surveillance program.  Test performance has been validated by [facility] for patients greater than or equal to 43 year old. It is not intended to diagnose infection nor to guide or monitor treatment.      Studies: No results found.  Scheduled Meds: . ciprofloxacin  400 mg Intravenous Q12H  . fentaNYL      . fentaNYL      . HYDROmorphone      . HYDROmorphone      . insulin aspart  0-20 Units Subcutaneous TID WC  . insulin aspart  0-5 Units Subcutaneous QHS  .  insulin NPH Human  20 Units Subcutaneous BID AC & HS  . pantoprazole  40 mg Oral Daily  . sotalol  120 mg Oral BID   Continuous Infusions: . sodium chloride 75 mL/hr at 02/09/16 1634  . dextrose 50 mL/hr at 02/09/16 0200    Principal Problem:   Epigastric abdominal pain Active Problems:   Atrial fibrillation (HCC)   Long term (current) use of anticoagulants   OSA (obstructive sleep apnea)   Cholelithiasis   GERD (gastroesophageal reflux disease)   Diabetes mellitus type 2 in  obese ([place])   Cholelithiases   Preop cardiovascular exam    Time spent: 30 minutes.     Verneita Griffes, MD [facility] [phone]

## 2016-02-10 ENCOUNTER — Inpatient Hospital Stay (HOSPITAL_COMMUNITY): Payer: BLUE CROSS/BLUE SHIELD

## 2016-02-10 ENCOUNTER — Encounter (HOSPITAL_COMMUNITY): Payer: Self-pay | Admitting: Surgery

## 2016-02-10 DIAGNOSIS — I482 Chronic atrial fibrillation: Secondary | ICD-10-CM

## 2016-02-10 LAB — COMPREHENSIVE METABOLIC PANEL
ALT: 186 U/L — AB (ref 17–63)
AST: 82 U/L — ABNORMAL HIGH (ref 15–41)
Albumin: 3.2 g/dL — ABNORMAL LOW (ref 3.5–5.0)
Alkaline Phosphatase: 203 U/L — ABNORMAL HIGH (ref 38–126)
Anion gap: 13 (ref 5–15)
BILIRUBIN TOTAL: 1.8 mg/dL — AB (ref 0.3–1.2)
BUN: 8 mg/dL (ref 6–20)
CHLORIDE: 100 mmol/L — AB (ref 101–111)
CO2: 23 mmol/L (ref 22–32)
CREATININE: 1.1 mg/dL (ref 0.61–1.24)
Calcium: 9.1 mg/dL (ref 8.9–10.3)
Glucose, Bld: 227 mg/dL — ABNORMAL HIGH (ref 65–99)
Potassium: 4.3 mmol/L (ref 3.5–5.1)
Sodium: 136 mmol/L (ref 135–145)
TOTAL PROTEIN: 6.9 g/dL (ref 6.5–8.1)

## 2016-02-10 LAB — CBC
HCT: 41.4 % (ref 39.0–52.0)
Hemoglobin: 13.4 g/dL (ref 13.0–17.0)
MCH: 29.2 pg (ref 26.0–34.0)
MCHC: 32.4 g/dL (ref 30.0–36.0)
MCV: 90.2 fL (ref 78.0–100.0)
PLATELETS: 188 10*3/uL (ref 150–400)
RBC: 4.59 MIL/uL (ref 4.22–5.81)
RDW: 14.5 % (ref 11.5–15.5)
WBC: 7.6 10*3/uL (ref 4.0–10.5)

## 2016-02-10 LAB — GLUCOSE, CAPILLARY: GLUCOSE-CAPILLARY: 194 mg/dL — AB (ref 65–99)

## 2016-02-10 MED ORDER — HYDROCODONE-ACETAMINOPHEN 5-325 MG PO TABS: 1.0000 | ORAL_TABLET | Freq: Two times a day (BID) | ORAL | Status: DC | PRN

## 2016-02-10 NOTE — Progress Notes (Signed)
Patient ID: Charles [name], male   DOB: 1946/09/18, 70 y.o.   MRN: [id number]     [address] [place], [place] [address]    Phone: [phone] FAX: [phone]     Subjective: t bili down to 1.8.  Sore, but overall better. Passing flatus, voiding.  Tolerating fulls.  Afebrile.  VSS.   Objective:  Vital signs:  Filed Vitals:   02/09/16 1520 02/09/16 2155 02/10/16 0221 02/10/16 0610  BP: 168/85 166/89 157/76 153/83  Pulse: 72 68 68 77  Temp: 98 F (36.7 C) 98.5 F (36.9 C) 97.6 F (36.4 C) 98 F (36.7 C)  TempSrc: Oral Oral Oral Oral  Resp: _0 Height:      Weight:      SpO2: 96% 96% 100% 100%    Last BM Date: 02/08/16  Intake/Output   Yesterday:  03/20 0701 - 03/21 0700 In: 2430 [P.O.:580; I.V.:1850] Out: 61 [Urine:800; Drains:80; Blood:15] This shift:     Physical Exam: General: Pt awake/alert/oriented x4 in no acute distress Abdomen: Soft.  Nondistended.   Mildly tender at incisions only.  Incisions are c/d/i.  ruq drain with serosanguinous output.  No evidence of peritonitis.  No incarcerated hernias.    Problem List:   Principal Problem:   Epigastric abdominal pain Active Problems:   Atrial fibrillation (HCC)   Long term (current) use of anticoagulants   OSA (obstructive sleep apnea)   Cholelithiasis   GERD (gastroesophageal reflux disease)   Diabetes mellitus type 2 in obese ([place])   Cholelithiases   Preop cardiovascular exam    Results:   Labs: Results for orders placed or performed during the hospital encounter of 02/05/16 (from the past 48 hour(s))  Glucose, capillary     Status: Abnormal   Collection Time: 02/08/16 11:55 AM  Result Value Ref Range   Glucose-Capillary 215 (H) 65 - 99 mg/dL  Glucose, capillary     Status: Abnormal   Collection Time: 02/08/16  4:36 PM  Result Value Ref Range   Glucose-Capillary 162 (H) 65 - 99 mg/dL  Glucose, capillary      Status: Abnormal   Collection Time: 02/08/16 10:53 PM  Result Value Ref Range   Glucose-Capillary 192 (H) 65 - 99 mg/dL  Protime-INR     Status: None   Collection Time: 02/09/16  4:24 AM  Result Value Ref Range   Prothrombin Time 15.1 11.6 - 15.2 seconds   INR 1.17 0.00 - 1.49  CBC with Differential/Platelet     Status: Abnormal   Collection Time: 02/09/16  4:24 AM  Result Value Ref Range   WBC 5.1 4.0 - 10.5 K/uL   RBC 4.04 (L) 4.22 - 5.81 MIL/uL   Hemoglobin 11.7 (L) 13.0 - 17.0 g/dL   HCT 36.2 (L) 39.0 - 52.0 %   MCV 89.6 78.0 - 100.0 fL   MCH 29.0 26.0 - 34.0 pg   MCHC 32.3 30.0 - 36.0 g/dL   RDW 14.3 11.5 - 15.5 %   Platelets 205 150 - 400 K/uL   Neutrophils Relative % 59 %   Neutro Abs 3.0 1.7 - 7.7 K/uL   Lymphocytes Relative 29 %   Lymphs Abs 1.5 0.7 - 4.0 K/uL   Monocytes Relative 8 %   Monocytes Absolute 0.4 0.1 - 1.0 K/uL   Eosinophils Relative 3 %   Eosinophils Absolute 0.2 0.0 - 0.7 K/uL   Basophils  Relative 0 %   Basophils Absolute 0.0 0.0 - 0.1 K/uL  Surgical pcr screen     Status: None   Collection Time: 02/09/16  7:08 AM  Result Value Ref Range   MRSA, PCR NEGATIVE NEGATIVE   Staphylococcus aureus NEGATIVE NEGATIVE    Comment:        The Xpert SA Assay (FDA approved for NASAL specimens in patients over 3 years of age), is one component of a comprehensive surveillance program.  Test performance has been validated by [facility] for patients greater than or equal to 72 year old. It is not intended to diagnose infection nor to guide or monitor treatment.   Glucose, capillary     Status: Abnormal   Collection Time: 02/09/16  7:29 AM  Result Value Ref Range   Glucose-Capillary 123 (H) 65 - 99 mg/dL  Glucose, capillary     Status: Abnormal   Collection Time: 02/09/16  9:39 AM  Result Value Ref Range   Glucose-Capillary 175 (H) 65 - 99 mg/dL  Glucose, capillary     Status: Abnormal   Collection Time: 02/09/16 12:05 PM  Result Value Ref Range    Glucose-Capillary 195 (H) 65 - 99 mg/dL  Glucose, capillary     Status: Abnormal   Collection Time: 02/09/16  6:13 PM  Result Value Ref Range   Glucose-Capillary 231 (H) 65 - 99 mg/dL  Glucose, capillary     Status: Abnormal   Collection Time: 02/09/16 11:27 PM  Result Value Ref Range   Glucose-Capillary 229 (H) 65 - 99 mg/dL   Comment 1 Notify RN   CBC     Status: None   Collection Time: 02/10/16  4:13 AM  Result Value Ref Range   WBC 7.6 4.0 - 10.5 K/uL   RBC 4.59 4.22 - 5.81 MIL/uL   Hemoglobin 13.4 13.0 - 17.0 g/dL   HCT 41.4 39.0 - 52.0 %   MCV 90.2 78.0 - 100.0 fL   MCH 29.2 26.0 - 34.0 pg   MCHC 32.4 30.0 - 36.0 g/dL   RDW 14.5 11.5 - 15.5 %   Platelets 188 150 - 400 K/uL  Comprehensive metabolic panel     Status: Abnormal   Collection Time: 02/10/16  4:13 AM  Result Value Ref Range   Sodium 136 135 - 145 mmol/L   Potassium 4.3 3.5 - 5.1 mmol/L   Chloride 100 (L) 101 - 111 mmol/L   CO2 23 22 - 32 mmol/L   Glucose, Bld 227 (H) 65 - 99 mg/dL   BUN 8 6 - 20 mg/dL   Creatinine, Ser 1.10 0.61 - 1.24 mg/dL   Calcium 9.1 8.9 - 10.3 mg/dL   Total Protein 6.9 6.5 - 8.1 g/dL   Albumin 3.2 (L) 3.5 - 5.0 g/dL   AST 82 (H) 15 - 41 U/L   ALT 186 (H) 17 - 63 U/L   Alkaline Phosphatase 203 (H) 38 - 126 U/L   Total Bilirubin 1.8 (H) 0.3 - 1.2 mg/dL   GFR calc non Af Amer >60 >60 mL/min   GFR calc Af Amer >60 >60 mL/min    Comment: (NOTE) The eGFR has been calculated using the CKD EPI equation. This calculation has not been validated in all clinical situations. eGFR's persistently <60 mL/min signify possible Chronic Kidney Disease.    Anion gap 13 5 - 15    Imaging / Studies: No results found.  Medications / Allergies:  Scheduled Meds: . ciprofloxacin  400 mg Intravenous  Q12H  . insulin aspart  0-20 Units Subcutaneous TID WC  . insulin aspart  0-5 Units Subcutaneous QHS  . insulin NPH Human  20 Units Subcutaneous BID AC & HS  . pantoprazole  40 mg Oral Daily  .  sotalol  120 mg Oral BID   Continuous Infusions: . dextrose 50 mL/hr at 02/09/16 1903   PRN Meds:.acetaminophen **OR** acetaminophen, bisacodyl, HYDROcodone-acetaminophen, morphine injection, nitroGLYCERIN, ondansetron **OR** ondansetron (ZOFRAN) IV, technetium TC 39M mebrofenin  Antibiotics: Anti-infectives    Start     Dose/Rate Route Frequency Ordered Stop   02/08/16 0800  ceFAZolin (ANCEF) IVPB 2 g/50 mL premix     2 g 100 mL/hr over 30 Minutes Intravenous To ShortStay Surgical 02/08/16 0745 02/09/16 0800   02/06/16 1400  ciprofloxacin (CIPRO) IVPB 400 mg     400 mg 200 mL/hr over 60 Minutes Intravenous Every 12 hours 02/06/16 1227          Assessment/Plan Chronic cholecystitis with cholelithiasis  POD#1 laparoscopic cholecystectomy---Dr. Ninfa Linden  -stable for DC from a surgical standpoint with drain, follow up arranged.  Instructions provided.  Will need Rx for pain meds.   -may resume Xarelto    Erby Pian, [facility] Pager [phone]) For consults and floor pages call [phone]([id number])  02/10/2016 7:50 AM

## 2016-02-10 NOTE — Discharge Instructions (Signed)
LAPAROSCOPIC SURGERY: POST OP INSTRUCTIONS ° °1. DIET: Follow a light bland diet the first 24 hours after arrival home, such as soup, liquids, crackers, etc.  Be sure to include lots of fluids daily.  Avoid fast food or heavy meals as your are more likely to get nauseated.  Eat a low fat the next few days after surgery.   °2. Take your usually prescribed home medications unless otherwise directed. °3. PAIN CONTROL: °a. Pain is best controlled by a usual combination of three different methods TOGETHER: °i. Ice/Heat °ii. Over the counter pain medication °iii. Prescription pain medication °b. Most patients will experience some swelling and bruising around the incisions.  Ice packs or heating pads (30-60 minutes up to 6 times a day) will help. Use ice for the first few days to help decrease swelling and bruising, then switch to heat to help relax tight/sore spots and speed recovery.  Some people prefer to use ice alone, heat alone, alternating between ice & heat.  Experiment to what works for you.  Swelling and bruising can take several weeks to resolve.   °c. It is helpful to take an over-the-counter pain medication regularly for the first few weeks.  Choose one of the following that works best for you: °i. Naproxen (Aleve, etc)  Two 220mg tabs twice a day °ii. Ibuprofen (Advil, etc) Three 200mg tabs four times a day (every meal & bedtime) °iii. Acetaminophen (Tylenol, etc) 500-650mg four times a day (every meal & bedtime) °d. A  prescription for pain medication (such as oxycodone, hydrocodone, etc) should be given to you upon discharge.  Take your pain medication as prescribed.  °i. If you are having problems/concerns with the prescription medicine (does not control pain, nausea, vomiting, rash, itching, etc), please call us [phone] to see if we need to switch you to a different pain medicine that will work better for you and/or control your side effect better. °ii. If you need a refill on your pain medication,  please contact your pharmacy.  They will contact our office to request authorization. Prescriptions will not be filled after 5 pm or on week-ends. °4. Avoid getting constipated.  Between the surgery and the pain medications, it is common to experience some constipation.  Increasing fluid intake and taking a fiber supplement (such as Metamucil, Citrucel, FiberCon, MiraLax, etc) 1-2 times a day regularly will usually help prevent this problem from occurring.  A mild laxative (prune juice, Milk of Magnesia, MiraLax, etc) should be taken according to package directions if there are no bowel movements after 48 hours.   °5. Watch out for diarrhea.  If you have many loose bowel movements, simplify your diet to bland foods & liquids for a few days.  Stop any stool softeners and decrease your fiber supplement.  Switching to mild anti-diarrheal medications (Kayopectate, Pepto Bismol) can help.  If this worsens or does not improve, please call us. °6. Wash / shower every day.  You may shower over the dressings as they are waterproof.  Continue to shower over incision(s) after the dressing is off. °7. Remove your waterproof bandages 5 days after surgery.  You may leave the incision open to air.  You may replace a dressing/Band-Aid to cover the incision for comfort if you wish.  °8. ACTIVITIES as tolerated:   °a. You may resume regular (light) daily activities beginning the next day--such as daily self-care, walking, climbing stairs--gradually increasing activities as tolerated.  If you can walk 30 minutes without difficulty, it   is safe to try more intense activity such as jogging, treadmill, bicycling, low-impact aerobics, swimming, etc. °b. Save the most intensive and strenuous activity for last such as sit-ups, heavy lifting, contact sports, etc  Refrain from any heavy lifting or straining until you are off narcotics for pain control.   °c. DO NOT PUSH THROUGH PAIN.  Let pain be your guide: If it hurts to do something, don't  do it.  Pain is your body warning you to avoid that activity for another week until the pain goes down. °d. You may drive when you are no longer taking prescription pain medication, you can comfortably wear a seatbelt, and you can safely maneuver your car and apply brakes. °e. You may have sexual intercourse when it is comfortable.  °9. FOLLOW UP in our office °a. Please call [facility] at [phone] to set up an appointment to see your surgeon in the office for a follow-up appointment approximately 2-3 weeks after your surgery. °b. Make sure that you call for this appointment the day you arrive home to insure a convenient appointment time. °10. IF YOU HAVE DISABILITY OR FAMILY LEAVE FORMS, BRING THEM TO THE OFFICE FOR PROCESSING.  DO NOT GIVE THEM TO YOUR DOCTOR. ° ° °WHEN TO CALL US [phone]: °1. Poor pain control °2. Reactions / problems with new medications (rash/itching, nausea, etc)  °3. Fever over 101.5 F (38.5 C) °4. Inability to urinate °5. Nausea and/or vomiting °6. Worsening swelling or bruising °7. Continued bleeding from incision. °8. Increased pain, redness, or drainage from the incision ° ° The clinic staff is available to answer your questions during regular business hours (8:30am-5pm).  Please don’t hesitate to call and ask to speak to one of our nurses for clinical concerns.  ° If you have a medical emergency, go to the nearest emergency room or call 911. ° A surgeon from [facility] is always on call at the hospitals ° ° °[facility] °[address], [address], , [place]  [address] ? °MAIN: [phone] ? TOLL FREE: [phone] ?  °FAX [phone] °[email or web address] ° °DRAIN CARE:   °You have a closed bulb drain to help you heal. ° °A bulb drain is a small, plastic reservoir which creates a gentle suction. It is used to remove excess fluid from a surgical wound. The color and amount of fluid will vary. Immediately after surgery, the  fluid is bright red. It may gradually change to a yellow color. When the amount decreases to about 1 or 2 tablespoons (15 to 30 cc) per 24 hours, your caregiver will usually remove it. ° °DAILY CARE °· Keep the bulb compressed at all times, except while emptying it. The compression creates suction.  °· Keep sites where the tubes enter the skin dry and covered with a light bandage (dressing).  °· Tape the tubes to your skin, 1 to 2 inches below the insertion sites, to keep from pulling on your stitches. Tubes are stitched in place and will not slip out.  °· Pin the bulb to your shirt (not to your pants) with a safety pin.  °· For the first few days after surgery, there usually is more fluid in the bulb. Empty the bulb whenever it becomes half full because the bulb does not create enough suction if it is too full. Include this amount in your 24 hour totals.  °· When the amount of drainage decreases, empty the bulb at the same time every   day. Write down the amounts and the 24 hour totals. Your caregiver will want to know them. This helps your caregiver know when the tubes can be removed.  °· (We anticipate removing the drain in 1-3 weeks, depending on when the output is <30mL a day for 2+ days) °· If there is drainage around the tube sites, change dressings and keep the area dry. If you see a clot in the tube, leave it alone. However, if the tube does not appear to be draining, let your caregiver know.  °TO EMPTY THE BULB °· Open the stopper to release suction.  °· Holding the stopper out of the way, pour drainage into the measuring cup that was sent home with you.  °· Measure and write down the amount. If there are 2 bulbs, note the amount of drainage from bulb 1 or bulb 2 and keep the totals separate. Your caregiver will want to know which tube is draining more.  °· Compress the bulb by folding it in half.  °· Replace the stopper.  °· Check the tape that holds the tube to your skin, and pin the bulb to your shirt.    °SEEK MEDICAL CARE IF: °· The drainage develops a bad odor.  °· You have an oral temperature above 102° F (38.9° C).  °· The amount of drainage from your wound suddenly increases or decreases.  °· You accidentally pull out your drain.  °· You have any other questions or concerns.  °MAKE SURE YOU:  °· Understand these instructions.  °· Will watch your condition.  °· Will get help right away if you are not doing well or get worse.  ° ° °· Call our office if you have any questions about your drain. [phone] °·  °

## 2016-02-10 NOTE — Anesthesia Postprocedure Evaluation (Signed)
Anesthesia Post Note  Patient: Charles [name]  Procedure(s) Performed: Procedure(s): LAPAROSCOPIC CHOLECYSTECTOMY  Patient location during evaluation: PACU Anesthesia Type: General Level of consciousness: awake Pain management: pain level controlled Vital Signs Assessment: post-procedure vital signs reviewed and stable Respiratory status: spontaneous breathing Cardiovascular status: stable Postop Assessment: no signs of nausea or vomiting Anesthetic complications: no    Last Vitals:  Filed Vitals:   02/10/16 0221 02/10/16 0610  BP: 157/76 153/83  Pulse: 68 77  Temp: 36.4 C 36.7 C  Resp: 18 18    Last Pain:  Filed Vitals:   02/10/16 1254  PainSc: 6                  Sharnese Heath

## 2016-02-10 NOTE — Care Management Note (Signed)
Case Management Note  Patient Details  Name: Charles [name] MRN: [id number] Date of Birth: 1946/10/14  Subjective/Objective:                    Action/Plan:   Expected Discharge Date:                  Expected Discharge Plan:  Home/Self Care  In-House Referral:     Discharge planning Services     Post Acute Care Choice:    Choice offered to:     DME Arranged:    DME Agency:     HH Arranged:    [place] Agency:     Status of Service:  In process, will continue to follow  Medicare Important Message Given:    Date Medicare IM Given:    Medicare IM give by:    Date Additional Medicare IM Given:    Additional Medicare Important Message give by:     If discussed at [place] of Stay Meetings, dates discussed:  02-10-16  Additional Comments: UR updated  Marilu Favre, RN 02/10/2016, 10:53 AM

## 2016-02-10 NOTE — Discharge Summary (Signed)
Physician Discharge Summary  Charles [name] [id number] DOB: [birth year] DOA: 02/05/2016  PCP: [name], MD  Admit date: 02/05/2016 Discharge date: 02/10/2016  Time spent: 45 minutes  Recommendations for Outpatient Follow-up:  1. Patient to follow-up with Dr. Nedra Hai in the office for drain removal on 02/13/16 2. Soft diet on follow-up 3. Given hard prescription for Percocet 4. Get a bmet and a CBC in 1 week 5.   Discharge Diagnoses:  Principal Problem:   Epigastric abdominal pain Active Problems:   Atrial fibrillation (HCC)   Long term (current) use of anticoagulants   OSA (obstructive sleep apnea)   Cholelithiasis   GERD (gastroesophageal reflux disease)   Diabetes mellitus type 2 in obese ([place])   Cholelithiases   Preop cardiovascular exam   Discharge Condition: Improved  Diet recommendation: Heart healthy low-salt, soft diet  Filed Weights   02/05/16 0106  Weight: 126.554 kg (279 lb)    History of present illness:  70 year active ? [works in [place], [place]] DM ty II CAD-Prior [place], Stent Placement 1998 and 2008, cath 2008, Myoview 2014 was LOW risk-LVEF 47% Afib s/p PPM CHad2Vasc2 score=4, On Xarelto abdominal pain, intermittent since last November  had Laparotomy + appy & 12 " bowel resected age 35, Prior Dysphagia Last colonoscopy 2007 Treated Hpylori 10/24/15 stg III CKD Controlled HTn DM  admitted for the evaluation of the above. GI consulted and he underwent endoscopic ultrasound showed normal UGI tract, but there were several small shadowing stone in the gall bladder.  There some GB thickening and CBD wall thickening. He was started on IV antibiotics.  HIDA scan was negative for acute cholecystitis. Liver function tests improving.  Gen surgery consulted Cardiology consulted for Peri-op risk eval and patient ultimately underwent laparoscopic cholecystectomy as per below     Intermittent epigastric pain of  unclear etiology:-etiology GB stones.-note h/o recent [10/2015] rx H pylori Underwent EGD and endoscopic ultrasound and see the results above.  HIDA scan negative.  Liver function improving.  Further management as per GI/Gen surgery - continuing IV Cipro every 12.  -Would continue antibiotics as per recommendation from general surgery S/p Lap Chole 3/20-note some damage to cystic duct per Dr. Ninfa Linden.  -LFTs on discharge however very stable and expected within the range for having had surgery -Graduated to nonfatty heart healthy diet and stabilized for discharge with the drain removal as per [facility]  2. Acute/chronic STAGE 3 CKD on admission:  pre renal.  creatinine  improved with hydration  Saline locked 3/18 -Resumed on discharge  Hypertension: Controlled on sotolol monotherapy hctz 25 on hold Lisinopril 40 on hold -Patient had pain which caused increase in blood pressure on discharge therefore was started back on HCTZ and lisinopril  CAD-extensive h/o as above no antiplatelet agent in addition to Xarelto to be resumed upon discharge  resumed as per general surgery  Atrial fib s/p PPM Rate controlled, changed this am to IV heparin as planning for eventual surgery  Resume Xarelto as per general surgery   reflux continue pantoprazole 40 daily  Diabetes mellitus: Resume SSI.  oral hypoglycemics metformin 100 qd on hold, Amaryl 4 bid on hold Sugars 123-195 Continue NHP 40 bid---> drop dose 3/19 p.m. to 20 units as will be nothing by mouth for procedure and started D5 AT 50cc/hr at 0100 in anticipation of surgery on 3/20 Tolerating clears so just keep D5 50 cc per hour overnight and recheck LFTs in a.m.   OSA  on CPAP at night. .    Consultants:  GI   Gen Surg  Cardiology  Procedures:  Endoscopic Ultrasound  HIDA SCAN  Laparoscopic cholecystectomy 3/20 Dr. Ninfa Linden  Antibiotics:  cipro started on 3/17 and discontinued on discharge 3 test 21 as  per general surgery  Discharge Exam: Filed Vitals:   02/10/16 0221 02/10/16 0610  BP: 157/76 153/83  Pulse: 68 77  Temp: 97.6 F (36.4 C) 98 F (36.7 C)  Resp: 18 18   Doing fair walking the halls pain is much improved General: EOMI NCAT Cardiovascular: S1-S2 no murmur rub or gallop Respiratory: Clinically clear no added sound Abdomen soft no rebound no guarding Nontender in right upper quadrant  Discharge Instructions   Discharge Instructions    Diet - low sodium heart healthy    Complete by:  As directed      Discharge instructions    Complete by:  As directed   Follow up with General surgery as op as per their scheduling You should have some lab tests as determined by your primary provider or surgeon as per their input on follow-up Use incentive spirometer every 1 hour Walk as much as possible Soft non-fatty meals Work note has been provided     Increase activity slowly    Complete by:  As directed           Current Discharge Medication List    CONTINUE these medications which have CHANGED   Details  HYDROcodone-acetaminophen (NORCO/VICODIN) 5-325 MG tablet Take 1-2 tablets by mouth 2 (two) times daily as needed for moderate pain. Qty: 45 tablet, Refills: 0      CONTINUE these medications which have NOT CHANGED   Details  albuterol (PROVENTIL HFA;VENTOLIN HFA) 108 (90 BASE) MCG/ACT inhaler Inhale 1 puff into the lungs every 6 (six) hours as needed for wheezing or shortness of breath. Reported on 12/15/2015    alfuzosin (UROXATRAL) 10 MG 24 hr tablet Take 10 mg by mouth daily with breakfast.    dicyclomine (BENTYL) 10 MG capsule Take 1-2 capsules (10-20 mg total) by mouth 4 (four) times daily -  before meals and at bedtime. Qty: 360 capsule, Refills: 3    glimepiride (AMARYL) 4 MG tablet Take 4 mg by mouth 2 (two) times daily.    hydrochlorothiazide (HYDRODIURIL) 25 MG tablet Take 1 tablet (25 mg total) by mouth daily. Qty: 30 tablet, Refills: 1    insulin  regular (NOVOLIN R,HUMULIN R) 100 units/mL injection Inject 40 Units into the skin 3 (three) times daily.     lisinopril (PRINIVIL,ZESTRIL) 40 MG tablet Take 1 tablet (40 mg total) by mouth daily. Qty: 90 tablet, Refills: 3    metFORMIN (GLUCOPHAGE) 500 MG tablet Take 1,000 mg by mouth daily. With meal    nitroGLYCERIN (NITROSTAT) 0.4 MG SL tablet Place 1 tablet (0.4 mg total) under the tongue every 5 (five) minutes as needed for chest pain. Qty: 25 tablet, Refills: 5    NOVOLIN N RELION 100 UNIT/ML injection Inject 40 Units into the skin 2 (two) times daily. Use as directecd    omeprazole (PRILOSEC) 40 MG capsule Take 1 capsule (40 mg total) by mouth daily before breakfast. Qty: 90 capsule, Refills: 3    rivaroxaban (XARELTO) 20 MG TABS tablet Take 1 tablet (20 mg total) by mouth daily with supper. Qty: 30 tablet, Refills: 11   Associated Diagnoses: Paroxysmal atrial fibrillation (HCC)    sotalol (BETAPACE) 120 MG tablet Take 1 tablet (120 mg total) by  mouth 2 (two) times daily. Qty: 60 tablet, Refills: 10    traMADol-acetaminophen (ULTRACET) 37.5-325 MG per tablet Take 1 tablet by mouth daily.       STOP taking these medications     ondansetron (ZOFRAN) 4 MG tablet        Allergies  Allergen Reactions  . Chocolate Hives, Shortness Of Breath and Swelling  . Statins Other (See Comments)    Mental changes, muscle aches  . Codeine     Itching  . Oxytetracycline     Flushing in sunlight   Follow-up Information    Follow up with [facility] On 02/13/2016.   Specialty:  General Surgery   Why:  appt time 2:30PM nurse visit to take out drain    Contact information:   [address] [place] [address] [phone]       Follow up with [facility], MD In 3 weeks.   Specialty:  General Surgery   Why:  post operative check up   Contact information:   [place] [address] [phone]        The results of  significant diagnostics from this hospitalization (including imaging, microbiology, ancillary and laboratory) are listed below for reference.    Significant Diagnostic Studies: Nm Hepatobiliary Including Gb  02/06/2016  CLINICAL DATA:  Upper abdominal pain and bloating with elevated LFTs EXAM: NUCLEAR MEDICINE HEPATOBILIARY IMAGING TECHNIQUE: Sequential images of the abdomen were obtained out to 60 minutes following intravenous administration of radiopharmaceutical. RADIOPHARMACEUTICALS:  Five mCi Tc-64m  Choletec IV COMPARISON:  02/05/2016 FINDINGS: There is adequate uptake of radioactive tracer throughout the liver. The biliary system is not well visualized although some minimal activity passes into the small bowel. Nonvisualization of the gallbladder is noted despite treatment with 5 mg of morphine. IMPRESSION: Normal uptake of biliary tracer with only minimal excretion into the biliary tree. Nonvisualization of the gallbladder is noted despite morphine therapy. Given the poor opacification of the biliary tree this is equivocal for cholecystitis although suggests a possible cholestasis pattern. Electronically Signed   By: Inez Catalina M.D.   On: 02/06/2016 18:25   US Abdomen Limited Ruq  02/05/2016  CLINICAL DATA:  Transaminitis EXAM: US ABDOMEN LIMITED - RIGHT UPPER QUADRANT COMPARISON:  12/22/2015 FINDINGS: Gallbladder: Small subtly shadowing gallstones are now seen. There is no wall thickening or focal tenderness to suggest acute cholecystitis. Common bile duct: Diameter: 4 mm. Liver: Echogenic liver with poor acoustic transmission and pericholecystic sparing. Incidental 1 cm cyst in the right liver. IMPRESSION: 1. Cholelithiasis without signs of cholecystitis. 2. Hepatic steatosis. Electronically Signed   By: Monte Fantasia M.D.   On: 02/05/2016 02:40    Microbiology: Recent Results (from the past 240 hour(s))  Surgical pcr screen     Status: None   Collection Time: 02/09/16  7:08 AM  Result  Value Ref Range Status   MRSA, PCR NEGATIVE NEGATIVE Final   Staphylococcus aureus NEGATIVE NEGATIVE Final    Comment:        The Xpert SA Assay (FDA approved for NASAL specimens in patients over 53 years of age), is one component of a comprehensive surveillance program.  Test performance has been validated by [facility] for patients greater than or equal to 20 year old. It is not intended to diagnose infection nor to guide or monitor treatment.      Labs: Basic Metabolic Panel:  Recent Labs Lab 02/05/16 0119 02/06/16 0530 02/07/16 0700  02/08/16 0348 02/10/16 0413  NA 137 137 137 138 136  K 4.8 3.5 3.9 3.8 4.3  CL 101 104 103 105 100*  CO2 26 22 24  21* 23  GLUCOSE 254* 187* 225* 277* 227*  BUN 18 19 11 11 8   CREATININE 1.25* 1.47* 1.26* 1.24 1.10  CALCIUM 9.6 8.6* 8.8* 8.8* 9.1   Liver Function Tests:  Recent Labs Lab 02/05/16 0119 02/05/16 0503 02/06/16 0530 02/07/16 0700 02/08/16 0348 02/10/16 0413  AST 339*  --  92* 62* 106* 82*  ALT 228*  --  207* 161* 168* 186*  ALKPHOS 96  --  117 158* 168* 203*  BILITOT 3.0* 4.4* 5.7* 5.1* 3.0* 1.8*  PROT 7.5  --  6.4* 6.8 6.6 6.9  ALBUMIN 3.9  --  3.1* 3.1* 2.9* 3.2*    Recent Labs Lab 02/05/16 0119  LIPASE 33   No results for input(s): AMMONIA in the last 168 hours. CBC:  Recent Labs Lab 02/06/16 0530 02/07/16 0700 02/08/16 0348 02/09/16 0424 02/10/16 0413  WBC 6.7 4.7 4.8 5.1 7.6  NEUTROABS  --   --   --  3.0  --   HGB 11.6* 12.0* 11.5* 11.7* 13.4  HCT 35.6* 37.3* 36.1* 36.2* 41.4  MCV 88.3 89.7 89.1 89.6 90.2  PLT 175 186 183 205 188   Cardiac Enzymes: No results for input(s): CKTOTAL, CKMB, CKMBINDEX, TROPONINI in the last 168 hours. BNP: BNP (last 3 results) No results for input(s): BNP in the last 8760 hours.  ProBNP (last 3 results)  Recent Labs  10/21/15 0932  PROBNP 364.0*    CBG:  Recent Labs Lab 02/09/16 0939 02/09/16 1205 02/09/16 1813 02/09/16 2327 02/10/16 0804   GLUCAP 175* 195* 231* 229* 194*       Signed:  Nita Sells MD   [facility] 02/10/2016, 11:59 AM

## 2016-02-10 NOTE — Progress Notes (Signed)
Discussed discharge summary with patient. Reviewed all medications with patient. Patient received Rx. Educated patient on JP drain care at home. Patient provided teach back and demonstration. Patient ready for discharge.

## 2016-02-24 ENCOUNTER — Other Ambulatory Visit: Payer: Self-pay

## 2016-02-24 MED ORDER — HYDROCHLOROTHIAZIDE 25 MG PO TABS: 25.0000 mg | ORAL_TABLET | Freq: Every day | ORAL | Status: DC

## 2016-04-06 ENCOUNTER — Ambulatory Visit: Payer: Self-pay | Admitting: Pharmacist Clinician (PhC)/ Clinical Pharmacy Specialist

## 2016-04-06 DIAGNOSIS — Z7901 Long term (current) use of anticoagulants: Secondary | ICD-10-CM

## 2016-04-06 DIAGNOSIS — I482 Chronic atrial fibrillation, unspecified: Secondary | ICD-10-CM

## 2016-06-01 DIAGNOSIS — I4891 Unspecified atrial fibrillation: Secondary | ICD-10-CM | POA: Diagnosis not present

## 2016-06-01 DIAGNOSIS — E1165 Type 2 diabetes mellitus with hyperglycemia: Secondary | ICD-10-CM | POA: Diagnosis not present

## 2016-06-01 DIAGNOSIS — M1991 Primary osteoarthritis, unspecified site: Secondary | ICD-10-CM | POA: Diagnosis not present

## 2016-06-01 DIAGNOSIS — I10 Essential (primary) hypertension: Secondary | ICD-10-CM | POA: Diagnosis not present

## 2016-06-01 DIAGNOSIS — M79643 Pain in unspecified hand: Secondary | ICD-10-CM | POA: Diagnosis not present

## 2016-06-09 ENCOUNTER — Other Ambulatory Visit: Payer: Self-pay | Admitting: *Deleted

## 2016-06-09 MED ORDER — HYDROCHLOROTHIAZIDE 25 MG PO TABS: 25.0000 mg | ORAL_TABLET | Freq: Every day | ORAL | Status: DC

## 2016-06-16 ENCOUNTER — Encounter: Payer: Self-pay | Admitting: *Deleted

## 2016-06-28 ENCOUNTER — Other Ambulatory Visit: Payer: Self-pay | Admitting: Internal Medicine

## 2016-07-06 ENCOUNTER — Ambulatory Visit (INDEPENDENT_AMBULATORY_CARE_PROVIDER_SITE_OTHER): Payer: BLUE CROSS/BLUE SHIELD | Admitting: *Deleted

## 2016-07-06 ENCOUNTER — Encounter: Payer: Self-pay | Admitting: Internal Medicine

## 2016-07-06 ENCOUNTER — Ambulatory Visit (INDEPENDENT_AMBULATORY_CARE_PROVIDER_SITE_OTHER): Payer: BLUE CROSS/BLUE SHIELD | Admitting: Nurse Practitioner

## 2016-07-06 ENCOUNTER — Encounter (INDEPENDENT_AMBULATORY_CARE_PROVIDER_SITE_OTHER): Payer: Self-pay

## 2016-07-06 ENCOUNTER — Encounter: Payer: Self-pay | Admitting: Nurse Practitioner

## 2016-07-06 VITALS — BP 136/80 | HR 72 | Ht 69.0 in | Wt 290.4 lb

## 2016-07-06 DIAGNOSIS — E785 Hyperlipidemia, unspecified: Secondary | ICD-10-CM

## 2016-07-06 DIAGNOSIS — I259 Chronic ischemic heart disease, unspecified: Secondary | ICD-10-CM

## 2016-07-06 DIAGNOSIS — Z7901 Long term (current) use of anticoagulants: Secondary | ICD-10-CM

## 2016-07-06 DIAGNOSIS — I495 Sick sinus syndrome: Secondary | ICD-10-CM

## 2016-07-06 DIAGNOSIS — I482 Chronic atrial fibrillation, unspecified: Secondary | ICD-10-CM

## 2016-07-06 DIAGNOSIS — I480 Paroxysmal atrial fibrillation: Secondary | ICD-10-CM

## 2016-07-06 LAB — LIPID PANEL
Cholesterol: 178 mg/dL (ref 125–200)
HDL: 44 mg/dL
LDL Cholesterol: 99 mg/dL
Total CHOL/HDL Ratio: 4 ratio
Triglycerides: 175 mg/dL — ABNORMAL HIGH
VLDL: 35 mg/dL — ABNORMAL HIGH

## 2016-07-06 LAB — CUP PACEART INCLINIC DEVICE CHECK
Battery Impedance: 2700 Ohm
Battery Voltage: 2.76 V
Brady Statistic RA Percent Paced: 94 %
Brady Statistic RV Percent Paced: 1 % — CL
Date Time Interrogation Session: 20170815113822
Implantable Lead Implant Date: 20091007
Implantable Lead Implant Date: 20091007
Implantable Lead Location: 753859
Implantable Lead Location: 753860
Lead Channel Impedance Value: 458 Ohm
Lead Channel Impedance Value: 594 Ohm
Lead Channel Pacing Threshold Amplitude: 0.75 V
Lead Channel Pacing Threshold Amplitude: 1 V
Lead Channel Pacing Threshold Pulse Width: 0.4 ms
Lead Channel Pacing Threshold Pulse Width: 0.4 ms
Lead Channel Sensing Intrinsic Amplitude: 1.3 mV
Lead Channel Sensing Intrinsic Amplitude: 12 mV
Lead Channel Setting Pacing Amplitude: 2 V
Lead Channel Setting Pacing Amplitude: 2.5 V
Lead Channel Setting Pacing Pulse Width: 0.4 ms
Lead Channel Setting Sensing Sensitivity: 2 mV
Pulse Gen Model: 5826
Pulse Gen Serial Number: 1266370

## 2016-07-06 LAB — HEPATIC FUNCTION PANEL
ALT: 19 U/L (ref 9–46)
AST: 13 U/L (ref 10–35)
Albumin: 4.1 g/dL (ref 3.6–5.1)
Alkaline Phosphatase: 58 U/L (ref 40–115)
Bilirubin, Direct: 0.1 mg/dL
Indirect Bilirubin: 0.4 mg/dL (ref 0.2–1.2)
Total Bilirubin: 0.5 mg/dL (ref 0.2–1.2)
Total Protein: 7.3 g/dL (ref 6.1–8.1)

## 2016-07-06 LAB — BASIC METABOLIC PANEL
BUN: 26 mg/dL — ABNORMAL HIGH (ref 7–25)
CO2: 28 mmol/L (ref 20–31)
Calcium: 9.4 mg/dL (ref 8.6–10.3)
Chloride: 104 mmol/L (ref 98–110)
Creat: 1.28 mg/dL — ABNORMAL HIGH (ref 0.70–1.25)
Glucose, Bld: 112 mg/dL — ABNORMAL HIGH (ref 65–99)
Potassium: 5.1 mmol/L (ref 3.5–5.3)
Sodium: 139 mmol/L (ref 135–146)

## 2016-07-06 LAB — CBC
HCT: 40.5 % (ref 38.5–50.0)
Hemoglobin: 13.7 g/dL (ref 13.2–17.1)
MCH: 29.7 pg (ref 27.0–33.0)
MCHC: 33.8 g/dL (ref 32.0–36.0)
MCV: 87.9 fL (ref 80.0–100.0)
MPV: 9.6 fL (ref 7.5–12.5)
Platelets: 223 K/uL (ref 140–400)
RBC: 4.61 MIL/uL (ref 4.20–5.80)
RDW: 14.5 % (ref 11.0–15.0)
WBC: 7 K/uL (ref 3.8–10.8)

## 2016-07-06 LAB — MAGNESIUM: Magnesium: 2.1 mg/dL (ref 1.5–2.5)

## 2016-07-06 MED ORDER — ROSUVASTATIN CALCIUM 5 MG PO TABS: 5.0000 mg | ORAL_TABLET | ORAL | 3 refills | Status: DC

## 2016-07-06 NOTE — Progress Notes (Signed)
CARDIOLOGY OFFICE NOTE  Date:  07/06/2016    Charles [name] Date of Birth: 03-24-1946 Medical Record [id number]  PCP:  [name], MD  Cardiologist:  [name]    Chief Complaint  Patient presents with  . Coronary Artery Disease  . Hypertension  . Hyperlipidemia    6 month check - seen for Dr. [name]    History of Present Illness: Charles [name] is a 70 y.o. male who presents today for a 6 month check. Seen for Dr. [name].  He has a history of CAD with remote PCI, HLD - statin intolerant, obesity, OSA, PPM in place and CHF with prior normalization of his EF.   Last seen back in February by Dr. [name] and felt to be doing ok. He was switched over to Xarelto from his Coumadin.   Comes in today. Here alone. Mostly limited by arthritis. No chest pain. Breathing ok. No swelling. Has gained weight - attributed to his insulin - hungry all the time. 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 Thinking of retiring next year. Not dizzy. No palpitations.   Past Medical History:  Diagnosis Date  . Arthritis    "knees, hands, base of my back" (02/05/2016)  . Asthma    "touch q once & awhile" (02/05/2016)  . Chronic venous insufficiency    with prior venous stasis ulcers  . Complication of anesthesia    "when coming out, I choke and get very restless if breathing tube is still in"  . Coronary artery disease    last cath 2008,  remote PCI of the LAD with Lcx/OM bifurcation stenting and proximal RCA stenting  . Diabetes mellitus, type II ([place])    AODM  . DOE (dyspnea on exertion)    TEE 07/10/13 - LV EF 40-45%  . Dyslipidemia   . Exogenous obesity    severe  . History of blood transfusion ~ 2015   related to "when they went in to get my kidney stones"  . Hx of colonic polyps 09/2006   inflammatory polyp at hepatic flexure. not adenomatous or malignant.   . Hypertension   . LBBB (left bundle branch block)    He has developed a native LBBB which was seen on  his last visit of March 2014 (From OV note 07/03/13)   . Long term (current) use of anticoagulants   . MI (myocardial infarction) ([place]) 1995  . Nephrolithiasis   . OSA on CPAP    "nasal CPAP"  . Osteoarthritis, knee   . PAF (paroxysmal atrial fibrillation) ([place])   . 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 Jude Zephyr XL DR 5826, dual chamber, rate responsive. No arrhythmias recorded and he has an excellent threshold.  . SSS (sick sinus syndrome) ([place])   . Statin intolerance    Hx of. Now tolerating Zetia & Livalo well.     Past Surgical History:  Procedure Laterality Date  . APPENDECTOMY  1962  . CHOLECYSTECTOMY  02/09/2016   Procedure: LAPAROSCOPIC CHOLECYSTECTOMY;  Surgeon: Coralie Keens, MD;  Location: [facility];  Service: General;;  . [place] & 2008   Last cath in 2008, remote LAD stenting: Cx/OM bifurcation, proximal right coronary.   . CYSTOSCOPY W/ URETERAL STENT PLACEMENT Left 06/16/2009; 06/26/2009   Left proximal ureteral stone/notes 03/23/2011  . ESOPHAGOGASTRODUODENOSCOPY  09/2015   w/biopsy  . EUS N/A 02/06/2016   Procedure: UPPER ENDOSCOPIC ULTRASOUND (EUS) RADIAL;  Surgeon:  [name], MD;  Location: [facility];  Service: Endoscopy;  Laterality: N/A;  . INSERT / REPLACE / REMOVE PACEMAKER  08/28/08   St. Jude Zephyr XL DR 5826, dual chamber, rate responsive. No arrhythmias recorded and he has an excellent threshold.  . SMALL INTESTINE SURGERY  1962     Medications: Current Outpatient Prescriptions  Medication Sig Dispense Refill  . alfuzosin (UROXATRAL) 10 MG 24 hr tablet Take 10 mg by mouth daily with breakfast.    . dicyclomine (BENTYL) 10 MG capsule Take 1-2 capsules (10-20 mg total) by mouth 4 (four) times daily -  before meals and at bedtime. 360 capsule 3  . hydrochlorothiazide (HYDRODIURIL) 25 MG tablet Take 1 tablet (25 mg total) by mouth daily. 30 tablet 10  . HYDROcodone-acetaminophen (NORCO/VICODIN)  5-325 MG tablet Take 1-2 tablets by mouth 2 (two) times daily as needed for moderate pain. 45 tablet 0  . insulin regular (NOVOLIN R,HUMULIN R) 100 units/mL injection Inject 50 Units into the skin 2 (two) times daily before a meal.     . lisinopril (PRINIVIL,ZESTRIL) 40 MG tablet TAKE ONE TABLET BY MOUTH DAILY 90 tablet 1  . metFORMIN (GLUCOPHAGE) 500 MG tablet Take 1,000 mg by mouth daily. With meal    . nitroGLYCERIN (NITROSTAT) 0.4 MG SL tablet Place 1 tablet (0.4 mg total) under the tongue every 5 (five) minutes as needed for chest pain. 25 tablet 5  . omeprazole (PRILOSEC) 40 MG capsule Take 1 capsule (40 mg total) by mouth daily before breakfast. 90 capsule 3  . rivaroxaban (XARELTO) 20 MG TABS tablet Take 1 tablet (20 mg total) by mouth daily with supper. 30 tablet 11  . sotalol (BETAPACE) 120 MG tablet Take 1 tablet (120 mg total) by mouth 2 (two) times daily. 60 tablet 10  . traMADol-acetaminophen (ULTRACET) 37.5-325 MG per tablet Take 1 tablet by mouth daily.     Derrill Memo ON 07/07/2016] rosuvastatin (CRESTOR) 5 MG tablet Take 1 tablet (5 mg total) by mouth 3 (three) times a week. 15 tablet 3   No current facility-administered medications for this visit.     Allergies: Allergies  Allergen Reactions  . Chocolate Hives, Shortness Of Breath and Swelling  . Statins Other (See Comments)    Mental changes, muscle aches  . Codeine     Itching  . Oxytetracycline     Flushing in sunlight    Social History: The patient  reports that he quit smoking about 41 years ago. His smoking use included Cigarettes. He has a 5.00 pack-year smoking history. He has never used smokeless tobacco. He reports that he drinks alcohol. He reports that he does not use drugs.   Family History: The patient's family history includes Arthritis in his sister; Colon polyps in his sister; Epilepsy in his brother; Heart attack (age of onset: 50) in his mother; Hypertension in his sister; Lung cancer in his maternal  grandfather; Stroke in his maternal grandmother and paternal grandfather.   Review of Systems: Please see the history of present illness.   Otherwise, the review of systems is positive for none.   All other systems are reviewed and negative.   Physical Exam: VS:  BP 136/80   Pulse 72   Ht 5\' 9"  (1.753 m)   Wt 290 lb 6.4 oz (131.7 kg)   SpO2 97% Comment: at rest  BMI 42.88 kg/m  .  BMI Body mass index is 42.88 kg/m.  Wt Readings from Last 3 Encounters:  07/06/16  290 lb 6.4 oz (131.7 kg)  02/05/16 279 lb (126.6 kg)  01/05/16 278 lb 12.8 oz (126.5 kg)    General: Pleasant. Well developed, well nourished and in no acute distress. He is obese.   HEENT: Normal.  Neck: Supple, no JVD, carotid bruits, or masses noted.  Cardiac: Regular rate and rhythm. No murmurs, rubs, or gallops. No edema.  Respiratory:  Lungs are clear to auscultation bilaterally with normal work of breathing.  GI: Soft and nontender. Obese MS: No deformity or atrophy. Gait and ROM intact.  Skin: Warm and dry. Color is normal.  Neuro:  Strength and sensation are intact and no gross focal deficits noted.  Psych: Alert, appropriate and with normal affect.   LABORATORY DATA:  EKG:  EKG is not ordered today.  Lab Results  Component Value Date   WBC 7.6 02/10/2016   HGB 13.4 02/10/2016   HCT 41.4 02/10/2016   PLT 188 02/10/2016   GLUCOSE 227 (H) 02/10/2016   CHOL  12/14/2008    117        ATP III CLASSIFICATION:  <200     mg/dL   Desirable  200-239  mg/dL   Borderline High  >=240    mg/dL   High          TRIG 97 12/14/2008   HDL 39 (L) 12/14/2008   LDLCALC  12/14/2008    59        Total Cholesterol/HDL:CHD Risk Coronary Heart Disease Risk Table                     Men   Women  1/2 Average Risk   3.4   3.3  Average Risk       5.0   4.4  2 X Average Risk   9.6   7.1  3 X Average Risk  23.4   11.0        Use the calculated Patient Ratio above and the CHD Risk Table to determine the patient's CHD  Risk.        ATP III CLASSIFICATION (LDL):  <100     mg/dL   Optimal  100-129  mg/dL   Near or Above                    Optimal  130-159  mg/dL   Borderline  160-189  mg/dL   High  >190     mg/dL   Very High   ALT 186 (H) 02/10/2016   AST 82 (H) 02/10/2016   NA 136 02/10/2016   K 4.3 02/10/2016   CL 100 (L) 02/10/2016   CREATININE 1.10 02/10/2016   BUN 8 02/10/2016   CO2 23 02/10/2016   TSH 1.704 Test methodology is 3rd generation TSH 12/14/2008   INR 1.17 02/09/2016   HGBA1C  10/05/2008    6.1 (NOTE)   The ADA recommends the following therapeutic goal for glycemic   control related to Hgb A1C measurement:   Goal of Therapy:   < 7.0% Hgb A1C   Reference: American Diabetes Association: Clinical Practice   Recommendations 2008, Diabetes Care,  2008, 31:(Suppl 1).    BNP (last 3 results) No results for input(s): BNP in the last 8760 hours.  ProBNP (last 3 results)  Recent Labs  10/21/15 0932  PROBNP 364.0*     Other Studies Reviewed Today:  1. Sick sinus syndrome - device checked today.   2. Chronic systolic dysfunction (EF 0000000 returned to normal  by echo 7/15 Stable symptoms  3. CAD - No ischemic symptoms Has not tolerated statins.  Has not been to the lipid clinic - he is agreeable to trying Crestor 5mg  just 3 times a week.  4. Afib - Well controlled with sotalol daily chads2vasc score is at least 5. He should continue life long anticoagulation.  He is now on Xarelto - no problems noted.  5. OSA Compliance with CPAP encouraged (he is compliant)  6. Obesity Weight is up - most likely due to his insulin therapy. Discussed at length. Very limited by his arthritis which unfortunately, will only get worse with weight gain.   7. Hypertension - BP ok on current regimen.   8. HLD - referred to lipid clinic but does not look like this was completed. He is statin intolerant but has never tried just low dose 3 times a week - will try.  Current medicines  are reviewed with the patient today.  The patient does not have concerns regarding medicines other than what has been noted above.  The following changes have been made:  See above.  Labs/ tests ordered today include:    Orders Placed This Encounter  Procedures  . Basic metabolic panel  . Magnesium  . Hepatic function panel  . Lipid panel  . CBC     Disposition:   FU with Dr. [name] and his team in 6 months.   Patient is agreeable to this plan and will call if any problems develop in the interim.   Signed: Burtis Junes, RN, ANP-C 07/06/2016 11:23 AM  [facility] [address] [place], [place]  [address] Phone: [phone] Fax: [phone]

## 2016-07-06 NOTE — Patient Instructions (Addendum)
We will be checking the following labs today - BMET, CBC, HPF, Lipids and MG   Lipids and LFTs in 6 weeks  Medication Instructions:    Continue with your current medicines. BUT  Let's try Crestor 5 mg to take just Monday-Wednesday-Friday - this has been sent to your drug store.   Testing/Procedures To Be Arranged:  N/A  Follow-Up:   See Dr. [name] in 6 months    Other Special Instructions:   N/A    If you need a refill on your cardiac medications before your next appointment, please call your pharmacy.   Call the [place] office at [phone] if you have any questions, problems or concerns.

## 2016-07-06 NOTE — Progress Notes (Signed)
Pacemaker check in clinic. Normal device function. Thresholds, sensing, impedances consistent with previous measurements. Device programmed to maximize longevity. 3 mode switches- all < 1 min. Device programmed at appropriate safety margins. Histogram distribution appropriate for patient activity level. Device programmed to optimize intrinsic conduction. Estimated longevity 3-4.25 years. ROV with JA 01/11/16.

## 2016-07-07 ENCOUNTER — Other Ambulatory Visit: Payer: Self-pay | Admitting: *Deleted

## 2016-07-07 DIAGNOSIS — I482 Chronic atrial fibrillation, unspecified: Secondary | ICD-10-CM

## 2016-07-27 DIAGNOSIS — B369 Superficial mycosis, unspecified: Secondary | ICD-10-CM | POA: Diagnosis not present

## 2016-07-27 DIAGNOSIS — H6242 Otitis externa in other diseases classified elsewhere, left ear: Secondary | ICD-10-CM | POA: Diagnosis not present

## 2016-07-27 DIAGNOSIS — H60509 Unspecified acute noninfective otitis externa, unspecified ear: Secondary | ICD-10-CM | POA: Diagnosis not present

## 2016-07-28 ENCOUNTER — Emergency Department (HOSPITAL_COMMUNITY): Payer: BLUE CROSS/BLUE SHIELD

## 2016-07-28 ENCOUNTER — Inpatient Hospital Stay (HOSPITAL_COMMUNITY)
Admission: EM | Admit: 2016-07-28 | Discharge: 2016-07-30 | DRG: 251 | Disposition: A | Discharge status: Home / Self Care | Payer: BLUE CROSS/BLUE SHIELD | Attending: Internal Medicine | Admitting: Internal Medicine

## 2016-07-28 ENCOUNTER — Encounter (HOSPITAL_COMMUNITY): Payer: Self-pay | Admitting: Emergency Medicine

## 2016-07-28 DIAGNOSIS — I872 Venous insufficiency (chronic) (peripheral): Secondary | ICD-10-CM | POA: Diagnosis not present

## 2016-07-28 DIAGNOSIS — I25110 Atherosclerotic heart disease of native coronary artery with unstable angina pectoris: Secondary | ICD-10-CM | POA: Diagnosis present

## 2016-07-28 DIAGNOSIS — Z955 Presence of coronary angioplasty implant and graft: Secondary | ICD-10-CM | POA: Diagnosis not present

## 2016-07-28 DIAGNOSIS — I480 Paroxysmal atrial fibrillation: Secondary | ICD-10-CM | POA: Diagnosis present

## 2016-07-28 DIAGNOSIS — I1310 Hypertensive heart and chronic kidney disease without heart failure, with stage 1 through stage 4 chronic kidney disease, or unspecified chronic kidney disease: Secondary | ICD-10-CM | POA: Diagnosis not present

## 2016-07-28 DIAGNOSIS — I252 Old myocardial infarction: Secondary | ICD-10-CM

## 2016-07-28 DIAGNOSIS — Z950 Presence of cardiac pacemaker: Secondary | ICD-10-CM | POA: Diagnosis not present

## 2016-07-28 DIAGNOSIS — Z79899 Other long term (current) drug therapy: Secondary | ICD-10-CM

## 2016-07-28 DIAGNOSIS — I200 Unstable angina: Secondary | ICD-10-CM | POA: Diagnosis not present

## 2016-07-28 DIAGNOSIS — E785 Hyperlipidemia, unspecified: Secondary | ICD-10-CM | POA: Diagnosis present

## 2016-07-28 DIAGNOSIS — N182 Chronic kidney disease, stage 2 (mild): Secondary | ICD-10-CM | POA: Diagnosis present

## 2016-07-28 DIAGNOSIS — I2510 Atherosclerotic heart disease of native coronary artery without angina pectoris: Secondary | ICD-10-CM | POA: Diagnosis not present

## 2016-07-28 DIAGNOSIS — I214 Non-ST elevation (NSTEMI) myocardial infarction: Principal | ICD-10-CM | POA: Diagnosis present

## 2016-07-28 DIAGNOSIS — Z9049 Acquired absence of other specified parts of digestive tract: Secondary | ICD-10-CM

## 2016-07-28 DIAGNOSIS — Z7901 Long term (current) use of anticoagulants: Secondary | ICD-10-CM | POA: Diagnosis not present

## 2016-07-28 DIAGNOSIS — Z9861 Coronary angioplasty status: Secondary | ICD-10-CM

## 2016-07-28 DIAGNOSIS — Y848 Other medical procedures as the cause of abnormal reaction of the patient, or of later complication, without mention of misadventure at the time of the procedure: Secondary | ICD-10-CM | POA: Diagnosis present

## 2016-07-28 DIAGNOSIS — G4733 Obstructive sleep apnea (adult) (pediatric): Secondary | ICD-10-CM | POA: Diagnosis present

## 2016-07-28 DIAGNOSIS — I4891 Unspecified atrial fibrillation: Secondary | ICD-10-CM | POA: Diagnosis not present

## 2016-07-28 DIAGNOSIS — Z6841 Body Mass Index (BMI) 40.0 and over, adult: Secondary | ICD-10-CM | POA: Diagnosis not present

## 2016-07-28 DIAGNOSIS — Z794 Long term (current) use of insulin: Secondary | ICD-10-CM | POA: Diagnosis not present

## 2016-07-28 DIAGNOSIS — R079 Chest pain, unspecified: Secondary | ICD-10-CM | POA: Diagnosis present

## 2016-07-28 DIAGNOSIS — E6609 Other obesity due to excess calories: Secondary | ICD-10-CM | POA: Diagnosis present

## 2016-07-28 DIAGNOSIS — R0789 Other chest pain: Secondary | ICD-10-CM | POA: Diagnosis not present

## 2016-07-28 DIAGNOSIS — Z87891 Personal history of nicotine dependence: Secondary | ICD-10-CM

## 2016-07-28 DIAGNOSIS — E1122 Type 2 diabetes mellitus with diabetic chronic kidney disease: Secondary | ICD-10-CM | POA: Diagnosis present

## 2016-07-28 DIAGNOSIS — T82858A Stenosis of vascular prosthetic devices, implants and grafts, initial encounter: Secondary | ICD-10-CM | POA: Diagnosis not present

## 2016-07-28 HISTORY — DX: Presence of cardiac pacemaker: Z95.0

## 2016-07-28 HISTORY — DX: Unspecified chronic bronchitis: J42

## 2016-07-28 HISTORY — PX: CORONARY ANGIOPLASTY: SHX604

## 2016-07-28 HISTORY — DX: Non-ST elevation (NSTEMI) myocardial infarction: I21.4

## 2016-07-28 LAB — I-STAT TROPONIN, ED
Troponin i, poc: 0.18 ng/mL (ref 0.00–0.08)
Troponin i, poc: 0.23 ng/mL (ref 0.00–0.08)

## 2016-07-28 LAB — BASIC METABOLIC PANEL
ANION GAP: 10 (ref 5–15)
BUN: 23 mg/dL — AB (ref 6–20)
CHLORIDE: 106 mmol/L (ref 101–111)
CO2: 23 mmol/L (ref 22–32)
Calcium: 9.3 mg/dL (ref 8.9–10.3)
Creatinine, Ser: 1.45 mg/dL — ABNORMAL HIGH (ref 0.61–1.24)
GFR calc Af Amer: 55 mL/min — ABNORMAL LOW (ref >60)
GFR, EST NON AFRICAN AMERICAN: 48 mL/min — AB (ref >60)
GLUCOSE: 187 mg/dL — AB (ref 65–99)
POTASSIUM: 4.1 mmol/L (ref 3.5–5.1)
Sodium: 139 mmol/L (ref 135–145)

## 2016-07-28 LAB — CBC
HEMATOCRIT: 41.5 % (ref 39.0–52.0)
HEMOGLOBIN: 13.3 g/dL (ref 13.0–17.0)
MCH: 29 pg (ref 26.0–34.0)
MCHC: 32 g/dL (ref 30.0–36.0)
MCV: 90.6 fL (ref 78.0–100.0)
Platelets: 203 10*3/uL (ref 150–400)
RBC: 4.58 MIL/uL (ref 4.22–5.81)
RDW: 14.2 % (ref 11.5–15.5)
WBC: 7.6 10*3/uL (ref 4.0–10.5)

## 2016-07-28 MED ORDER — ASPIRIN 81 MG PO CHEW
324.0000 mg | CHEWABLE_TABLET | Freq: Once | ORAL | Status: AC
  Administered 2016-07-28: 324 mg via ORAL
  Filled 2016-07-28: qty 4

## 2016-07-28 NOTE — H&P (Signed)
History and Physical   Admit date: 07/28/2016 Name:  Charles [name] Medical record number: [id number] DOB/Age:  June 04, 1946  70 y.o. male  Referring Physician:   [facility]  Primary Cardiologist:  Dr. [name]  Primary Physician:   [facility]  Chief complaint/reason for admission: Chest pain  HPI:  This 70 year old male has a prior history of diabetes hypertensive heart disease hyperlipidemia and obesity and chronic venous insufficiency.  He has a previous posterior myocardial infarction treated with angioplasty in 1995 and later had restenosis treated again with angioplasty. In 1998 he had bifurcation stenting of the circumflex and first marginal as well as a stent to the LAD which were bare metal stents.    In 2002 he had stenting of the right coronary artery with 2 bare metal stents in 2 places.  In 2008 he had a Promus drug-eluting stent placed to the circumflex by Dr. Claiborne Billings which was a 3.5 x 15 mm stent.  He had done fairly well since then but developed sick sinus syndrome as well as paroxysmal atrial fibrillation.  He had a pacemaker implanted previously and also has been on sotalol which is helping maintain sinus rhythm.  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  He had the onset tonight of midsternal chest discomfort with a tingling feeling in his teeth and jaw reminiscent of his previous issues.  He tried 2 nitroglycerin at home without relief and was transported to the hospital.  An EKG showed a paced rhythm and troponins were initially elevated.  His discomfort abated but then returned and again has abated at the present time.  He has been on Xarelto with the last dose being done last evening.  He has had recent a rotator cuff issues and has had severe pain involving his right shoulder.  He normally denies PND or orthopnea.  He wears pressure support stockings and has chronic venous insufficiency normally.  He  is admitted at this time for further evaluation.   Past Medical History:  Diagnosis Date  . Asthma    "touch q once & awhile" (02/05/2016)  . Chronic venous insufficiency    with prior venous stasis ulcers  . Complication of anesthesia    "when coming out, I choke and get very restless if breathing tube is still in"  . Coronary artery disease    last cath 2008,  remote PCI of the LAD with Lcx/OM bifurcation stenting and proximal RCA stenting  . Diabetes mellitus, type II ([place])    AODM  . Dyslipidemia   . Exogenous obesity    severe  . History of blood transfusion ~ 2015   related to "when they went in to get my kidney stones"  . Hx of colonic polyps 09/2006   inflammatory polyp at hepatic flexure. not adenomatous or malignant.   . Hypertension   . LBBB (left bundle branch block)    He has developed a native LBBB which was seen on his last visit of March 2014 (From OV note 07/03/13)   . Long term (current) use of anticoagulants   . MI (myocardial infarction) ([place]) 1995  . Nephrolithiasis   . OSA on CPAP    "nasal CPAP"  . Osteoarthritis, knee   . PAF (paroxysmal atrial fibrillation) ([place])   . 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 Jude Zephyr XL DR 5826, dual chamber, rate responsive. No arrhythmias recorded and he has an excellent threshold.  [name]  SSS (sick sinus syndrome) ([place])   . Statin intolerance    Hx of. Now tolerating Zetia & Livalo well.        Past Surgical History:  Procedure Laterality Date  . APPENDECTOMY  1962  . CHOLECYSTECTOMY  02/09/2016   Procedure: LAPAROSCOPIC CHOLECYSTECTOMY;  Surgeon: Coralie Keens, MD;  Location: [facility];  Service: General;;  . [place] & 2008   Last cath in 2008, remote LAD stenting: Cx/OM bifurcation, proximal right coronary.   . CYSTOSCOPY W/ URETERAL STENT PLACEMENT Left 06/16/2009; 06/26/2009   Left proximal ureteral stone/notes 03/23/2011  . ESOPHAGOGASTRODUODENOSCOPY  09/2015    w/biopsy  . EUS N/A 02/06/2016   Procedure: UPPER ENDOSCOPIC ULTRASOUND (EUS) RADIAL;  Surgeon: [name], MD;  Location: [facility];  Service: Endoscopy;  Laterality: N/A;  . INSERT / REPLACE / REMOVE PACEMAKER  08/28/08   St. Jude Zephyr XL DR 5826, dual chamber, rate responsive. No arrhythmias recorded and he has an excellent threshold.  . [place]  .  Allergies: is allergic to chocolate; statins; codeine; and oxytetracycline.   Medications: Prior to Admission medications   Medication Sig Start Date End Date Taking? Authorizing Provider  alfuzosin (UROXATRAL) 10 MG 24 hr tablet Take 10 mg by mouth daily with breakfast.   Yes Historical Provider, MD  hydrochlorothiazide (HYDRODIURIL) 25 MG tablet Take 1 tablet (25 mg total) by mouth daily. 06/09/16  Yes Thompson Grayer, MD  Insulin Regular Human (HUMULIN R U-500 KWIKPEN [place]) Inject 50 units of lipase into the skin 2 (two) times daily. Patient states he is taking 50 units twice daily   Yes Historical Provider, MD  lisinopril (PRINIVIL,ZESTRIL) 40 MG tablet TAKE ONE TABLET BY MOUTH DAILY 06/30/16  Yes Thompson Grayer, MD  metFORMIN (GLUCOPHAGE) 500 MG tablet Take 1,000 mg by mouth daily. With meal   Yes Historical Provider, MD  nitroGLYCERIN (NITROSTAT) 0.4 MG SL tablet Place 1 tablet (0.4 mg total) under the tongue every 5 (five) minutes as needed for chest pain. 01/12/16  Yes Thompson Grayer, MD  omeprazole (PRILOSEC) 40 MG capsule Take 1 capsule (40 mg total) by mouth daily before breakfast. 10/24/15  Yes [name], MD  rivaroxaban (XARELTO) 20 MG TABS tablet Take 1 tablet (20 mg total) by mouth daily with supper. 01/05/16  Yes Thompson Grayer, MD  sotalol (BETAPACE) 120 MG tablet Take 1 tablet (120 mg total) by mouth 2 (two) times daily. 01/27/16  Yes Thompson Grayer, MD  traMADol-acetaminophen (ULTRACET) 37.5-325 MG per tablet Take 1 tablet by mouth daily.  11/05/13  Yes Historical Provider, MD  dicyclomine (BENTYL) 10 MG  capsule Take 1-2 capsules (10-20 mg total) by mouth 4 (four) times daily -  before meals and at bedtime. Patient not taking: Reported on 07/28/2016 01/20/16   Gatha Mayer, MD  HYDROcodone-acetaminophen (NORCO/VICODIN) 5-325 MG tablet Take 1-2 tablets by mouth 2 (two) times daily as needed for moderate pain. Patient not taking: Reported on 07/28/2016 02/10/16   Nita Sells, MD  rosuvastatin (CRESTOR) 5 MG tablet Take 1 tablet (5 mg total) by mouth 3 (three) times a week. Patient not taking: Reported on 07/28/2016 07/07/16   Burtis Junes, NP    Family History:  Family Status  Relation Status  . Mother Deceased  . Father Deceased  . Sister Alive  . Brother Alive  . Maternal Grandmother Deceased  . Maternal Grandfather Deceased  . Paternal Grandmother Deceased  . Paternal Grandfather  Deceased  . Child Alive  . Sister   . Sister     Social History:   reports that he quit smoking about 41 years ago. His smoking use included Cigarettes. He has a 5.00 pack-year smoking history. He has never used smokeless tobacco. He reports that he drinks alcohol. He reports that he does not use drugs.   Social History   Social History Narrative   Married   Works at [employer] and also as [employer] at the [employer]: He has early cataracts.  He had a cholecystectomy earlier this year.  He has chronic venous insufficiency and mild edema worse pressure support stockings in his legs.  He has mild prostatism and has a history of renal stones.  Recently changed to a different insulin .  He has severe arthritis involving his knees and finds it difficult to walk or exercise.  Recent difficulties with rotator cuff issues in the right shoulder.  Other than as noted above, the remainder of the review of systems is normal  Physical Exam: BP 144/68   Pulse 70   Temp 97.9 F (36.6 C) (Oral)   Resp 15   SpO2 98%  General appearance: He is a severely obese white male currently  in no acute distress sitting up at the side of the bed Head: Normocephalic, without obvious abnormality, atraumatic, Balding male hair pattern Eyes: conjunctivae/corneas clear. PERRL, EOM's intact. Fundi not examined Neck: no adenopathy, no carotid bruit, no JVD and supple, symmetrical, trachea midline Lungs: clear to auscultation bilaterally Heart: regular rate and rhythm, S1, S2 normal, no murmur, click, rub or gallop Abdomen: soft, non-tender; bowel sounds normal; no masses,  no organomegaly Rectal: deferred Extremities: extremities normal, atraumatic, changes of chronic venous insufficiency noted, 1+ edema, tenderness through movement of the right shoulder Pulses: 2+ and symmetric Skin: Scattered sebaceous keratoses noted  Neurologic: Grossly normal  Labs: CBC  Recent Labs  07/28/16 1725  WBC 7.6  RBC 4.58  HGB 13.3  HCT 41.5  PLT 203  MCV 90.6  MCH 29.0  MCHC 32.0  RDW 14.2   CMP   Recent Labs  07/28/16 1725  NA 139  K 4.1  CL 106  CO2 23  GLUCOSE 187*  BUN 23*  CREATININE 1.45*  CALCIUM 9.3  GFRNONAA 48*  GFRAA 55*   ProBNP (last 3 results)  Recent Labs  10/21/15 0932  PROBNP 364.0*    Cardiac Panel (last 3 results) Troponin (Point of Care Test)  Recent Labs  07/28/16 2049  TROPIPOC 0.23*   EKG: AV paced rhythm  Radiology: Aortic Atherosclerosis   IMPRESSIONS: 1.  Unstable angina with possible non-STEMI 2.  Coronary artery disease with previous multiple stents including bare-metal as well as one drug-eluting stent to vessels as detailed above. 3.  Old posterior infarction 4.  Diabetes mellitus 5.  Hypertensive heart disease 6.  Obstructive sleep apnea 7.  Chronic venous stasis 8.  Paroxysmal atrial fibrillation currently in sinus rhythm 9.  Functioning permanent pacemaker 10.  Recent rotator cuff issues 11.  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  Symptoms are suggestive of a previous cardiac event  and has abnormal troponins.  Last Xarelto dose was 24 hours ago.  We'll place on heparin and keep nothing by mouth after midnight.  Likely will require repeat catheterization in view of symptoms and positive troponins.  Hold HCTZ and metformin in anticipation of cath.  Signed: Kerry Hough MD [facility]  07/28/2016, 11:07 PM

## 2016-07-28 NOTE — ED Triage Notes (Signed)
Dr Eulis Foster notified of abnormal labs

## 2016-07-28 NOTE — ED Provider Notes (Signed)
[facility] DEPT Provider Note   CSN: [id number] Arrival date & time: 07/28/16  1717     History   Chief Complaint Chief Complaint  Patient presents with  . Chest Pain    HPI Charles [name] is a 70 y.o. male.  He presents for evaluation of a discomfort in his teeth and jaw, which feels like a tingling sensation and dull chest discomfort rated 1-2/10, which started 1-2 hours ago. He reports feeling stressed today because of things that happened at work and while he was shopping at [employer]. He denies any associated nausea, vomiting, diaphoresis, cough, shortness of breath, weakness or dizziness. He took 2 sublingual nitroglycerin which did not change his pain. He does not take aspirin regularly and did not have any today. He has not used nitroglycerin previously, but has had a bottle since 1995. He saw his electrophysiologist, a couple of months ago and at that time his pacemaker was operating appropriately. There are no other known modifying factors.  HPI  Past Medical History:  Diagnosis Date  . Arthritis    "knees, hands, base of my back" (02/05/2016)  . Asthma    "touch q once & awhile" (02/05/2016)  . Chronic venous insufficiency    with prior venous stasis ulcers  . Complication of anesthesia    "when coming out, I choke and get very restless if breathing tube is still in"  . Coronary artery disease    last cath 2008,  remote PCI of the LAD with Lcx/OM bifurcation stenting and proximal RCA stenting  . Diabetes mellitus, type II ([place])    AODM  . DOE (dyspnea on exertion)    TEE 07/10/13 - LV EF 40-45%  . Dyslipidemia   . Exogenous obesity    severe  . History of blood transfusion ~ 2015   related to "when they went in to get my kidney stones"  . Hx of colonic polyps 09/2006   inflammatory polyp at hepatic flexure. not adenomatous or malignant.   . Hypertension   . LBBB (left bundle branch block)    He has developed a native LBBB which was seen on his last visit of  March 2014 (From OV note 07/03/13)   . Long term (current) use of anticoagulants   . MI (myocardial infarction) ([place]) 1995  . Nephrolithiasis   . OSA on CPAP    "nasal CPAP"  . Osteoarthritis, knee   . PAF (paroxysmal atrial fibrillation) ([place])   . 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 Jude Zephyr XL DR 5826, dual chamber, rate responsive. No arrhythmias recorded and he has an excellent threshold.  . SSS (sick sinus syndrome) ([place])   . Statin intolerance    Hx of. Now tolerating Zetia & Livalo well.     Patient Active Problem List   Diagnosis Date Noted  . Cholelithiases 02/07/2016  . Preop cardiovascular exam   . Cholelithiasis 02/05/2016  . Epigastric abdominal pain 02/05/2016  . GERD (gastroesophageal reflux disease) 02/05/2016  . Diabetes mellitus type 2 in obese ([place]) 02/05/2016  . OSA (obstructive sleep apnea)   . Atrial fibrillation ([place]) 03/10/2013  . Long term (current) use of anticoagulants 03/10/2013  . COLONIC POLYPS, HX OF 02/18/2010  . DIABETES MELLITUS, TYPE II 01/03/2008  . Hyperlipidemia 01/03/2008  . Obesity 01/03/2008  . HYPERTENSION, BENIGN 01/03/2008  . Chronic ischemic heart disease 01/03/2008  . BRONCHITIS, OBSTRUCTIVE CHRONIC 01/03/2008  . OSTEOARTHRITIS, KNEE 01/03/2008  . OTHER DYSPHAGIA 01/03/2008  . NEPHROLITHIASIS, HX  OF 01/03/2008    Past Surgical History:  Procedure Laterality Date  . APPENDECTOMY  1962  . CHOLECYSTECTOMY  02/09/2016   Procedure: LAPAROSCOPIC CHOLECYSTECTOMY;  Surgeon: Coralie Keens, MD;  Location: [facility];  Service: General;;  . [place] & 2008   Last cath in 2008, remote LAD stenting: Cx/OM bifurcation, proximal right coronary.   . CYSTOSCOPY W/ URETERAL STENT PLACEMENT Left 06/16/2009; 06/26/2009   Left proximal ureteral stone/notes 03/23/2011  . ESOPHAGOGASTRODUODENOSCOPY  09/2015   w/biopsy  . EUS N/A 02/06/2016   Procedure: UPPER ENDOSCOPIC ULTRASOUND (EUS) RADIAL;   Surgeon: [name], MD;  Location: [facility];  Service: Endoscopy;  Laterality: N/A;  . INSERT / REPLACE / REMOVE PACEMAKER  08/28/08   St. Jude Zephyr XL DR 5826, dual chamber, rate responsive. No arrhythmias recorded and he has an excellent threshold.  . [place] Medications    Prior to Admission medications   Medication Sig Start Date End Date Taking? Authorizing Provider  alfuzosin (UROXATRAL) 10 MG 24 hr tablet Take 10 mg by mouth daily with breakfast.    Historical Provider, MD  dicyclomine (BENTYL) 10 MG capsule Take 1-2 capsules (10-20 mg total) by mouth 4 (four) times daily -  before meals and at bedtime. 01/20/16   Gatha Mayer, MD  hydrochlorothiazide (HYDRODIURIL) 25 MG tablet Take 1 tablet (25 mg total) by mouth daily. 06/09/16   Thompson Grayer, MD  HYDROcodone-acetaminophen (NORCO/VICODIN) 5-325 MG tablet Take 1-2 tablets by mouth 2 (two) times daily as needed for moderate pain. 02/10/16   Nita Sells, MD  insulin regular (NOVOLIN R,HUMULIN R) 100 units/mL injection Inject 50 Units into the skin 2 (two) times daily before a meal.     Historical Provider, MD  lisinopril (PRINIVIL,ZESTRIL) 40 MG tablet TAKE ONE TABLET BY MOUTH DAILY 06/30/16   Thompson Grayer, MD  metFORMIN (GLUCOPHAGE) 500 MG tablet Take 1,000 mg by mouth daily. With meal    Historical Provider, MD  nitroGLYCERIN (NITROSTAT) 0.4 MG SL tablet Place 1 tablet (0.4 mg total) under the tongue every 5 (five) minutes as needed for chest pain. 01/12/16   Thompson Grayer, MD  omeprazole (PRILOSEC) 40 MG capsule Take 1 capsule (40 mg total) by mouth daily before breakfast. 10/24/15   [name], MD  rivaroxaban (XARELTO) 20 MG TABS tablet Take 1 tablet (20 mg total) by mouth daily with supper. 01/05/16   Thompson Grayer, MD  rosuvastatin (CRESTOR) 5 MG tablet Take 1 tablet (5 mg total) by mouth 3 (three) times a week. 07/07/16   Burtis Junes, NP  sotalol (BETAPACE) 120 MG tablet Take  1 tablet (120 mg total) by mouth 2 (two) times daily. 01/27/16   Thompson Grayer, MD  traMADol-acetaminophen (ULTRACET) 37.5-325 MG per tablet Take 1 tablet by mouth daily.  11/05/13   Historical Provider, MD    Family History Family History  Problem Relation Age of Onset  . Heart attack Mother 88    Died age 33  . Arthritis Sister   . Epilepsy Brother   . Stroke Maternal Grandmother   . Lung cancer Maternal Grandfather   . Stroke Paternal Grandfather   . Hypertension Sister   . Colon polyps Sister     Social History Social History  Substance Use Topics  . Smoking status: Former Smoker    Packs/day: 1.00    Years: 5.00    Types: Cigarettes  Quit date: 11/22/1974  . Smokeless tobacco: Never Used  . Alcohol use 0.0 oz/week     Comment: 02/05/2016 "maybe 6 pack of beer/month"     Allergies   Chocolate; Statins; Codeine; and Oxytetracycline   Review of Systems Review of Systems  All other systems reviewed and are negative.    Physical Exam Updated Vital Signs BP 157/75 (BP Location: Left Arm)   Pulse 70   Temp 97.9 F (36.6 C) (Oral)   Resp 18   SpO2 97%   Physical Exam  Constitutional: He is oriented to person, place, and time. He appears well-developed. No distress.  Overweight.  HENT:  Head: Normocephalic and atraumatic.  Right Ear: External ear normal.  Left Ear: External ear normal.  Eyes: Conjunctivae and EOM are normal. Pupils are equal, round, and reactive to light.  Neck: Normal range of motion and phonation normal. Neck supple.  Cardiovascular: Normal rate, regular rhythm and normal heart sounds.   Pulmonary/Chest: Effort normal and breath sounds normal. He exhibits no bony tenderness.  Abdominal: Soft. There is no tenderness.  Musculoskeletal: Normal range of motion.  Neurological: He is alert and oriented to person, place, and time. No cranial nerve deficit or sensory deficit. He exhibits normal muscle tone. Coordination normal.  Skin: Skin is warm,  dry and intact.  Psychiatric: He has a normal mood and affect. His behavior is normal. Judgment and thought content normal.  Nursing note and vitals reviewed.    ED Treatments / Results  Labs (all labs ordered are listed, but only abnormal results are displayed) Labs Reviewed  BASIC METABOLIC PANEL - Abnormal; Notable for the following:       Result Value   Glucose, Bld 187 (*)    BUN 23 (*)    Creatinine, Ser 1.45 (*)    GFR calc non Af Amer 48 (*)    GFR calc Af Amer 55 (*)    All other components within normal limits  I-STAT TROPOININ, ED - Abnormal; Notable for the following:    Troponin i, poc 0.18 (*)    All other components within normal limits  CBC    EKG  EKG Interpretation  Date/Time:  Wednesday July 28 2016 17:20:43 EDT Ventricular Rate:  70 PR Interval:  216 QRS Duration: 150 QT Interval:  436 QTC Calculation: 470 R Axis:   -48 Text Interpretation:  Atrial-paced rhythm with prolonged AV conduction Left axis deviation Left bundle branch block Abnormal ECG since 02/05/16, no significant change Confirmed by Eulis Foster  MD, Marques Ericson [phone]) on 07/28/2016 6:39:40 PM       Radiology Dg Chest 2 View  Result Date: 07/28/2016 CLINICAL DATA:  Chest pressure, jaw pain. EXAM: CHEST  2 VIEW COMPARISON:  CT chest 10/16/2015 and chest radiograph 12/14/2008. FINDINGS: Trachea is midline. Heart size stable. Thoracic aorta is calcified. Left subclavian pacemaker lead tips are in the right atrium and right ventricle. Lungs are clear. No pleural fluid. IMPRESSION: 1. No acute findings. 2. Aortic atherosclerosis. Electronically Signed   By: Lorin Picket M.D.   On: 07/28/2016 17:52    Procedures Procedures (including critical care time)  Medications Ordered in ED Medications  aspirin chewable tablet 324 mg (324 mg Oral Given 07/28/16 1847)     Initial Impression / Assessment and Plan / ED Course  I have reviewed the triage vital signs and the nursing notes.  Pertinent labs  & imaging results that were available during my care of the patient were reviewed by me and  considered in my medical decision making (see chart for details).  Clinical Course  Value Comment By Time  DG Chest 2 View NAD Daleen Bo, MD 09/06 1837  Troponin i, poc: (!!) 0.18 High  Daleen Bo, MD 09/06 1842  Creatinine: (!) 1.45 High  Daleen Bo, MD 09/06 1842  Troponin i, poc: (!!) 0.23 Slight increase of delta Troponin Daleen Bo, MD 09/06 2108   Medications  aspirin chewable tablet 324 mg (324 mg Oral Given 07/28/16 1847)    Patient Vitals for the past 24 hrs:  BP Temp Temp src Pulse Resp SpO2  07/28/16 1915 132/70 - - 69 11 98 %  07/28/16 1900 127/69 - - 69 15 98 %  07/28/16 1851 134/66 - - 70 15 98 %  07/28/16 1848 134/66 - - 68 18 97 %  07/28/16 1723 157/75 97.9 F (36.6 C) Oral 70 18 97 %    9:09 PM Reevaluation with update and discussion. After initial assessment and treatment, an updated evaluation reveals He states that the discomfort which he had on arrival, went away about 30 minutes ago. He has no other complaints at this time. Patient, family updated on findings.Richarda Blade   21:20- page requested for cardiology- Dr. Wynonia Lawman, will evaluate the patient in the emergency department  Final Clinical Impressions(s) / ED Diagnoses   Final diagnoses:  NSTEMI (non-ST elevated myocardial infarction) ([place])    Mild troponin elevation, from baseline 7 years ago. EKG does not indicate acute ischemia or infarct. Mild elevation of creatinine, with no recent comparison. Possible an STEMI versus chronic troponin elevation related kidney disease. Patient has history known coronary disease as well as a pacemaker, he will require admission for observation, management by cardiology.  Nursing Notes Reviewed/ Care Coordinated Applicable Imaging Reviewed Interpretation of Laboratory Data incorporated into ED treatment  Plan: Admit    New Prescriptions New Prescriptions     No medications on file     Daleen Bo, MD 07/28/16 2123

## 2016-07-28 NOTE — ED Triage Notes (Signed)
Pt states 1 Hr PTA started having central Chest pressure radiating to jaw, pt had MI in 1995 and states symptmos feel similar. Pt pain about a 2, has pacemaker, on xarelto. Took 2 nitro himself without any relief of pain. Pt in NAD, AAOX4,. VSS.

## 2016-07-29 ENCOUNTER — Encounter (HOSPITAL_COMMUNITY)
Admission: EM | Disposition: A | Discharge status: Home / Self Care | Payer: Self-pay | Source: Home / Self Care | Attending: Internal Medicine

## 2016-07-29 ENCOUNTER — Encounter (HOSPITAL_COMMUNITY): Payer: Self-pay | Admitting: *Deleted

## 2016-07-29 DIAGNOSIS — Z950 Presence of cardiac pacemaker: Secondary | ICD-10-CM

## 2016-07-29 DIAGNOSIS — I4891 Unspecified atrial fibrillation: Secondary | ICD-10-CM

## 2016-07-29 DIAGNOSIS — I2510 Atherosclerotic heart disease of native coronary artery without angina pectoris: Secondary | ICD-10-CM

## 2016-07-29 HISTORY — PX: CARDIAC CATHETERIZATION: SHX172

## 2016-07-29 LAB — BASIC METABOLIC PANEL
Anion gap: 9 (ref 5–15)
BUN: 16 mg/dL (ref 6–20)
CALCIUM: 9 mg/dL (ref 8.9–10.3)
CHLORIDE: 105 mmol/L (ref 101–111)
CO2: 24 mmol/L (ref 22–32)
CREATININE: 1.2 mg/dL (ref 0.61–1.24)
GFR calc Af Amer: >60 mL/min (ref >60)
GFR calc non Af Amer: 60 mL/min — ABNORMAL LOW (ref >60)
Glucose, Bld: 171 mg/dL — ABNORMAL HIGH (ref 65–99)
Potassium: 3.9 mmol/L (ref 3.5–5.1)
SODIUM: 138 mmol/L (ref 135–145)

## 2016-07-29 LAB — GLUCOSE, CAPILLARY
GLUCOSE-CAPILLARY: 198 mg/dL — AB (ref 65–99)
GLUCOSE-CAPILLARY: 218 mg/dL — AB (ref 65–99)
GLUCOSE-CAPILLARY: 233 mg/dL — AB (ref 65–99)
Glucose-Capillary: 187 mg/dL — ABNORMAL HIGH (ref 65–99)
Glucose-Capillary: 176 mg/dL — ABNORMAL HIGH (ref 65–99)

## 2016-07-29 LAB — PROTIME-INR
INR: 1
Prothrombin Time: 13.2 seconds (ref 11.4–15.2)

## 2016-07-29 LAB — TROPONIN I
TROPONIN I: 1.36 ng/mL — AB (ref <0.03)
Troponin I: 0.47 ng/mL (ref <0.03)
Troponin I: 4.99 ng/mL (ref <0.03)

## 2016-07-29 LAB — HEPARIN LEVEL (UNFRACTIONATED)
HEPARIN UNFRACTIONATED: 0.35 [IU]/mL (ref 0.30–0.70)
Heparin Unfractionated: 0.32 IU/mL (ref 0.30–0.70)

## 2016-07-29 LAB — LIPID PANEL
CHOL/HDL RATIO: 3.9 ratio
Cholesterol: 144 mg/dL (ref 0–200)
HDL: 37 mg/dL — AB (ref >40)
LDL Cholesterol: 67 mg/dL (ref 0–99)
Triglycerides: 202 mg/dL — ABNORMAL HIGH (ref <150)
VLDL: 40 mg/dL (ref 0–40)

## 2016-07-29 LAB — PLATELET COUNT: Platelets: 195 10*3/uL (ref 150–400)

## 2016-07-29 LAB — APTT
aPTT: 27 seconds (ref 24–36)
aPTT: 38 seconds — ABNORMAL HIGH (ref 24–36)

## 2016-07-29 LAB — POCT ACTIVATED CLOTTING TIME: ACTIVATED CLOTTING TIME: 274 s

## 2016-07-29 LAB — TSH: TSH: 1.951 u[IU]/mL (ref 0.350–4.500)

## 2016-07-29 SURGERY — LEFT HEART CATH AND CORONARY ANGIOGRAPHY
Anesthesia: LOCAL

## 2016-07-29 MED ORDER — FENTANYL CITRATE (PF) 100 MCG/2ML IJ SOLN
INTRAMUSCULAR | Status: DC | PRN
  Administered 2016-07-29: 25 ug via INTRAVENOUS
  Administered 2016-07-29 (×2): 50 ug via INTRAVENOUS

## 2016-07-29 MED ORDER — LISINOPRIL 40 MG PO TABS
40.0000 mg | ORAL_TABLET | Freq: Every day | ORAL | Status: DC
  Administered 2016-07-29 – 2016-07-30 (×2): 40 mg via ORAL
  Filled 2016-07-29 (×2): qty 1

## 2016-07-29 MED ORDER — FENTANYL CITRATE (PF) 100 MCG/2ML IJ SOLN
INTRAMUSCULAR | Status: AC
  Filled 2016-07-29: qty 2

## 2016-07-29 MED ORDER — HEPARIN (PORCINE) IN NACL 2-0.9 UNIT/ML-% IJ SOLN
INTRAMUSCULAR | Status: DC | PRN
  Administered 2016-07-29: 1000 mL

## 2016-07-29 MED ORDER — IOPAMIDOL (ISOVUE-370) INJECTION 76%
INTRAVENOUS | Status: DC | PRN
  Administered 2016-07-29: 120 mL via INTRA_ARTERIAL

## 2016-07-29 MED ORDER — ACETAMINOPHEN 325 MG PO TABS: 650.0000 mg | ORAL_TABLET | ORAL | Status: DC | PRN

## 2016-07-29 MED ORDER — MIDAZOLAM HCL 2 MG/2ML IJ SOLN
INTRAMUSCULAR | Status: AC
  Filled 2016-07-29: qty 2

## 2016-07-29 MED ORDER — ONDANSETRON HCL 4 MG/2ML IJ SOLN: 4.0000 mg | Freq: Four times a day (QID) | INTRAMUSCULAR | Status: DC | PRN

## 2016-07-29 MED ORDER — HEPARIN (PORCINE) IN NACL 100-0.45 UNIT/ML-% IJ SOLN
1450.0000 [IU]/h | INTRAMUSCULAR | Status: DC
  Administered 2016-07-29: 1450 [IU]/h via INTRAVENOUS
  Filled 2016-07-29: qty 250

## 2016-07-29 MED ORDER — HYDROCODONE-ACETAMINOPHEN 5-325 MG PO TABS
1.0000 | ORAL_TABLET | Freq: Two times a day (BID) | ORAL | Status: DC | PRN
  Administered 2016-07-29 (×2): 2 via ORAL
  Filled 2016-07-29 (×2): qty 2

## 2016-07-29 MED ORDER — NITROGLYCERIN 0.4 MG SL SUBL: 0.4000 mg | SUBLINGUAL_TABLET | SUBLINGUAL | Status: DC | PRN

## 2016-07-29 MED ORDER — SODIUM CHLORIDE 0.9% FLUSH: 3.0000 mL | Freq: Two times a day (BID) | INTRAVENOUS | Status: DC

## 2016-07-29 MED ORDER — FENTANYL CITRATE (PF) 100 MCG/2ML IJ SOLN
INTRAMUSCULAR | Status: DC | PRN
  Administered 2016-07-29: 25 ug via INTRAVENOUS

## 2016-07-29 MED ORDER — ROSUVASTATIN CALCIUM 10 MG PO TABS
5.0000 mg | ORAL_TABLET | ORAL | Status: DC
  Administered 2016-07-30: 5 mg via ORAL
  Filled 2016-07-29: qty 1

## 2016-07-29 MED ORDER — LIDOCAINE HCL (PF) 1 % IJ SOLN
INTRAMUSCULAR | Status: DC | PRN
  Administered 2016-07-29: 2 mL

## 2016-07-29 MED ORDER — SODIUM CHLORIDE 0.9% FLUSH: 3.0000 mL | INTRAVENOUS | Status: DC | PRN

## 2016-07-29 MED ORDER — TIROFIBAN (AGGRASTAT) BOLUS VIA INFUSION
INTRAVENOUS | Status: DC | PRN
  Administered 2016-07-29: 3265 ug via INTRAVENOUS

## 2016-07-29 MED ORDER — SODIUM CHLORIDE 0.9 % WEIGHT BASED INFUSION: 3.0000 mL/kg/h | INTRAVENOUS | Status: DC

## 2016-07-29 MED ORDER — SODIUM CHLORIDE 0.9 % WEIGHT BASED INFUSION: 1.0000 mL/kg/h | INTRAVENOUS | Status: AC

## 2016-07-29 MED ORDER — VERAPAMIL HCL 2.5 MG/ML IV SOLN
INTRAVENOUS | Status: DC | PRN
  Administered 2016-07-29: 10 mL via INTRA_ARTERIAL

## 2016-07-29 MED ORDER — INSULIN ASPART 100 UNIT/ML ~~LOC~~ SOLN
0.0000 [IU] | Freq: Three times a day (TID) | SUBCUTANEOUS | Status: DC
  Administered 2016-07-29: 4 [IU] via SUBCUTANEOUS
  Administered 2016-07-30 (×2): 7 [IU] via SUBCUTANEOUS

## 2016-07-29 MED ORDER — TIROFIBAN HCL IN NACL 5-0.9 MG/100ML-% IV SOLN
INTRAVENOUS | Status: AC
  Filled 2016-07-29: qty 100

## 2016-07-29 MED ORDER — VERAPAMIL HCL 2.5 MG/ML IV SOLN
INTRAVENOUS | Status: AC
  Filled 2016-07-29: qty 2

## 2016-07-29 MED ORDER — IOPAMIDOL (ISOVUE-370) INJECTION 76%
INTRAVENOUS | Status: AC
  Filled 2016-07-29: qty 100

## 2016-07-29 MED ORDER — LIDOCAINE HCL (PF) 1 % IJ SOLN
INTRAMUSCULAR | Status: AC
  Filled 2016-07-29: qty 30

## 2016-07-29 MED ORDER — SODIUM CHLORIDE 0.9 % WEIGHT BASED INFUSION: 1.0000 mL/kg/h | INTRAVENOUS | Status: DC

## 2016-07-29 MED ORDER — HEPARIN SODIUM (PORCINE) 1000 UNIT/ML IJ SOLN
INTRAMUSCULAR | Status: AC
  Filled 2016-07-29: qty 1

## 2016-07-29 MED ORDER — SODIUM CHLORIDE 0.9% FLUSH
3.0000 mL | Freq: Two times a day (BID) | INTRAVENOUS | Status: DC
  Administered 2016-07-29: 3 mL via INTRAVENOUS

## 2016-07-29 MED ORDER — HEPARIN (PORCINE) IN NACL 2-0.9 UNIT/ML-% IJ SOLN
INTRAMUSCULAR | Status: AC
  Filled 2016-07-29: qty 1000

## 2016-07-29 MED ORDER — HYDROMORPHONE HCL 1 MG/ML IJ SOLN
INTRAMUSCULAR | Status: AC
  Filled 2016-07-29: qty 1

## 2016-07-29 MED ORDER — SODIUM CHLORIDE 0.9 % IV SOLN: 250.0000 mL | INTRAVENOUS | Status: DC | PRN

## 2016-07-29 MED ORDER — PANTOPRAZOLE SODIUM 40 MG PO TBEC
40.0000 mg | DELAYED_RELEASE_TABLET | Freq: Every day | ORAL | Status: DC
  Administered 2016-07-29 – 2016-07-30 (×2): 40 mg via ORAL
  Filled 2016-07-29 (×2): qty 1

## 2016-07-29 MED ORDER — CLOPIDOGREL BISULFATE 300 MG PO TABS
ORAL_TABLET | ORAL | Status: AC
  Filled 2016-07-29: qty 2

## 2016-07-29 MED ORDER — MIDAZOLAM HCL 2 MG/2ML IJ SOLN
INTRAMUSCULAR | Status: DC | PRN
  Administered 2016-07-29: 1 mg via INTRAVENOUS

## 2016-07-29 MED ORDER — CLOPIDOGREL BISULFATE 75 MG PO TABS
600.0000 mg | ORAL_TABLET | Freq: Once | ORAL | Status: AC
  Administered 2016-07-29: 600 mg via ORAL

## 2016-07-29 MED ORDER — MIDAZOLAM HCL 2 MG/2ML IJ SOLN
INTRAMUSCULAR | Status: DC | PRN
  Administered 2016-07-29: 1 mg via INTRAVENOUS
  Administered 2016-07-29: 2 mg via INTRAVENOUS
  Administered 2016-07-29: 1 mg via INTRAVENOUS

## 2016-07-29 MED ORDER — HEPARIN (PORCINE) IN NACL 2-0.9 UNIT/ML-% IJ SOLN: INTRAMUSCULAR | Status: DC | PRN

## 2016-07-29 MED ORDER — HEPARIN SODIUM (PORCINE) 1000 UNIT/ML IJ SOLN
INTRAMUSCULAR | Status: DC | PRN
  Administered 2016-07-29: 6000 [IU] via INTRAVENOUS
  Administered 2016-07-29: 2000 [IU] via INTRAVENOUS
  Administered 2016-07-29: 6000 [IU] via INTRAVENOUS

## 2016-07-29 MED ORDER — CLOPIDOGREL BISULFATE 75 MG PO TABS
75.0000 mg | ORAL_TABLET | Freq: Every day | ORAL | Status: DC
  Administered 2016-07-30: 09:00:00 75 mg via ORAL
  Filled 2016-07-29: qty 1

## 2016-07-29 MED ORDER — ALFUZOSIN HCL ER 10 MG PO TB24
10.0000 mg | ORAL_TABLET | Freq: Every day | ORAL | Status: DC
  Administered 2016-07-29: 10 mg via ORAL
  Filled 2016-07-29 (×2): qty 1

## 2016-07-29 MED ORDER — HYDROMORPHONE HCL 1 MG/ML IJ SOLN
1.0000 mg | Freq: Once | INTRAMUSCULAR | Status: AC
  Administered 2016-07-29: 1 mg via INTRAVENOUS

## 2016-07-29 MED ORDER — HYDROMORPHONE HCL 1 MG/ML IJ SOLN
INTRAMUSCULAR | Status: DC | PRN
  Administered 2016-07-29 (×2): 0.5 mg via INTRAVENOUS

## 2016-07-29 MED ORDER — TIROFIBAN HCL IN NACL 5-0.9 MG/100ML-% IV SOLN
INTRAVENOUS | Status: DC | PRN
  Administered 2016-07-29: 0.15 ug/kg/min via INTRAVENOUS

## 2016-07-29 MED ORDER — ALFUZOSIN HCL ER 10 MG PO TB24
10.0000 mg | ORAL_TABLET | Freq: Every day | ORAL | Status: DC
  Filled 2016-07-29: qty 1

## 2016-07-29 MED ORDER — SOTALOL HCL 120 MG PO TABS
120.0000 mg | ORAL_TABLET | Freq: Two times a day (BID) | ORAL | Status: DC
  Administered 2016-07-29 – 2016-07-30 (×3): 120 mg via ORAL
  Filled 2016-07-29 (×5): qty 1

## 2016-07-29 MED ORDER — ASPIRIN EC 81 MG PO TBEC
81.0000 mg | DELAYED_RELEASE_TABLET | Freq: Every day | ORAL | Status: DC
  Administered 2016-07-29 – 2016-07-30 (×2): 81 mg via ORAL
  Filled 2016-07-29 (×2): qty 1

## 2016-07-29 SURGICAL SUPPLY — 19 items
BALLN ANGIOSCULPT RX 2.0X10 (BALLOONS) ×2
BALLN EMERGE MR 2.0X12 (BALLOONS) ×2
BALLOON ANGIOSCULPT RX 2.0X10 (BALLOONS) ×1 IMPLANT
BALLOON EMERGE MR 2.0X12 (BALLOONS) ×1 IMPLANT
CATH INFINITI 5 FR JL3.5 (CATHETERS) ×2 IMPLANT
CATH INFINITI JR4 5F (CATHETERS) ×2 IMPLANT
DEVICE RAD COMP TR BAND LRG (VASCULAR PRODUCTS) ×2 IMPLANT
GLIDESHEATH SLEND SS 6F .021 (SHEATH) ×2 IMPLANT
GUIDE CATH RUNWAY 6FR CLS3.5 (CATHETERS) ×2 IMPLANT
KIT ENCORE 26 ADVANTAGE (KITS) ×2 IMPLANT
KIT HEART LEFT (KITS) ×2 IMPLANT
PACK CARDIAC CATHETERIZATION (CUSTOM PROCEDURE TRAY) ×2 IMPLANT
TRANSDUCER W/STOPCOCK (MISCELLANEOUS) ×2 IMPLANT
TUBING CIL FLEX 10 FLL-RA (TUBING) ×2 IMPLANT
VALVE GUARDIAN II ~~LOC~~ HEMO (MISCELLANEOUS) ×2 IMPLANT
WIRE ASAHI PROWATER 180CM (WIRE) ×2 IMPLANT
WIRE EMERALD 3MM-J .035X260CM (WIRE) ×2 IMPLANT
WIRE HI TORQ BMW 190CM (WIRE) ×2 IMPLANT
WIRE HI TORQ VERSACORE-J 145CM (WIRE) ×2 IMPLANT

## 2016-07-29 NOTE — Care Management Note (Signed)
Case Management Note  Patient Details  Name: Charles [name] MRN: [id number] Date of Birth: May 13, 1946  Subjective/Objective:     Patient is s/p coronary balloon angioplasty, already on xarelto.  NCM will cont to follow for dc needs.                Action/Plan:   Expected Discharge Date:                  Expected Discharge Plan:  Home/Self Care  In-House Referral:     Discharge planning Services  CM Consult  Post Acute Care Choice:    Choice offered to:     DME Arranged:    DME Agency:     HH Arranged:    HH Agency:     Status of Service:  In process, will continue to follow  If discussed at Long Length of Stay Meetings, dates discussed:    Additional Comments:  Zenon Mayo, RN 07/29/2016, 6:30 PM

## 2016-07-29 NOTE — H&P (View-Only) (Signed)
Patient Name: Charles [name] Date of Encounter: 07/29/2016  Hospital Problem List     Active Problems:   Chronic venous insufficiency   Old inferior wall myocardial infarction   Cardiac pacemaker in situ   Non-STEMI (non-ST elevated myocardial infarction) (HCC)    Subjective   No cp or dyspnea at this time.   Inpatient Medications    . alfuzosin  10 mg Oral Q breakfast  . aspirin EC  81 mg Oral Daily  . insulin aspart  0-20 Units Subcutaneous TID WC  . lisinopril  40 mg Oral Daily  . pantoprazole  40 mg Oral Daily  . [START ON 07/30/2016] rosuvastatin  5 mg Oral Once per day on Mon Wed Fri  . sotalol  120 mg Oral BID    Vital Signs    Vitals:   07/28/16 2330 07/29/16 0039 07/29/16 0535 07/29/16 0824  BP: 137/74 (!) 170/82 (!) 162/86 (!) 157/91  Pulse: 69  69 66  Resp: 14 16 16 12   Temp:  97.8 F (36.6 C) 98.1 F (36.7 C) 97.8 F (36.6 C)  TempSrc:  Oral Oral Oral  SpO2: 95% 98% 99%   Weight:  288 lb 12.8 oz (131 kg) 288 lb (130.6 kg)   Height:  5\' 9"  (1.753 m)      Intake/Output Summary (Last 24 hours) at 07/29/16 0932 Last data filed at 07/29/16 0600  Gross per 24 hour  Intake            71.29 ml  Output                0 ml  Net            71.29 ml   Filed Weights   07/29/16 0039 07/29/16 0535  Weight: 288 lb 12.8 oz (131 kg) 288 lb (130.6 kg)    Physical Exam    General: Pleasant obese caucasian male, NAD. Neuro: Alert and oriented X 3. Moves all extremities spontaneously. Psych: Normal affect. HEENT:  Normal  Neck: Supple without bruits or JVD. Lungs:  Resp regular and unlabored, CTA. Heart: RRR no s3, s4, or murmurs. Abdomen: Soft, non-tender, non-distended, BS + x 4.  Extremities: No clubbing, cyanosis or edema. DP/PT/Radials 2+ and equal bilaterally.  Labs    CBC  Recent Labs  07/28/16 1725  WBC 7.6  HGB 13.3  HCT 41.5  MCV 90.6  PLT 123456   Basic Metabolic Panel  Recent Labs  07/28/16 1725 07/29/16 0624  NA 139 138  K 4.1  3.9  CL 106 105  CO2 23 24  GLUCOSE 187* 171*  BUN 23* 16  CREATININE 1.45* 1.20  CALCIUM 9.3 9.0   Liver Function Tests No results for input(s): AST, ALT, ALKPHOS, BILITOT, PROT, ALBUMIN in the last 72 hours. No results for input(s): LIPASE, AMYLASE in the last 72 hours. Cardiac Enzymes  Recent Labs  07/29/16 0052 07/29/16 0624  TROPONINI 0.47* 1.36*   BNP Invalid input(s): POCBNP D-Dimer No results for input(s): DDIMER in the last 72 hours. Hemoglobin A1C No results for input(s): HGBA1C in the last 72 hours. Fasting Lipid Panel  Recent Labs  07/29/16 0624  CHOL 144  HDL 37*  LDLCALC 67  TRIG 202*  CHOLHDL 3.9   Thyroid Function Tests  Recent Labs  07/29/16 0052  TSH 1.951    Telemetry    Atrial paced, 5 beat run of NSVT  ECG    SR, A-paced LBBB  Radiology  Assessment & Plan    70 year old male has a prior history of CAD with multiple stents, SSS, Afib s/p PPM, diabetes, hypertensive heart disease, hyperlipidemia, obesity and chronic venous insufficiency.  1. Unstable Angina/NSTEMI: Presented with midsternal chest discomfort and tingling in his teeth and jaw that was similar to symptoms he had with previous stent placement. He has ruled in with enzymes and was started on IV heparin. Currently without any chest discomfort. Plan for [facility] today with recommendations following procedure.   2. HTN: Initially controlled on admission, with some elevated readings. May need adjustment if reading remain elevated.  3. DM II: SSI  4. SSS/Afib s/p PPM: Last dose of Xarelto was 2 days ago. Currently on IV heparin  Signed, Charles Bellis NP-C Pager [phone]  Patient examined chart reviewed SSCP like previous angina and elevated Troponin. Xarelto last dose 48 hrs on heparin AGree with cath today   [name]

## 2016-07-29 NOTE — Progress Notes (Addendum)
Patient Name: AMAIR QUIST Date of Encounter: 07/29/2016  Hospital Problem List     Active Problems:   Chronic venous insufficiency   Old inferior wall myocardial infarction   Cardiac pacemaker in situ   Non-STEMI (non-ST elevated myocardial infarction) (HCC)    Subjective   No cp or dyspnea at this time.   Inpatient Medications    . alfuzosin  10 mg Oral Q breakfast  . aspirin EC  81 mg Oral Daily  . insulin aspart  0-20 Units Subcutaneous TID WC  . lisinopril  40 mg Oral Daily  . pantoprazole  40 mg Oral Daily  . [START ON 07/30/2016] rosuvastatin  5 mg Oral Once per day on Mon Wed Fri  . sotalol  120 mg Oral BID    Vital Signs    Vitals:   07/28/16 2330 07/29/16 0039 07/29/16 0535 07/29/16 0824  BP: 137/74 (!) 170/82 (!) 162/86 (!) 157/91  Pulse: 69  69 66  Resp: 14 16 16 12   Temp:  97.8 F (36.6 C) 98.1 F (36.7 C) 97.8 F (36.6 C)  TempSrc:  Oral Oral Oral  SpO2: 95% 98% 99%   Weight:  288 lb 12.8 oz (131 kg) 288 lb (130.6 kg)   Height:  5\' 9"  (1.753 m)      Intake/Output Summary (Last 24 hours) at 07/29/16 0932 Last data filed at 07/29/16 0600  Gross per 24 hour  Intake            71.29 ml  Output                0 ml  Net            71.29 ml   Filed Weights   07/29/16 0039 07/29/16 0535  Weight: 288 lb 12.8 oz (131 kg) 288 lb (130.6 kg)    Physical Exam    General: Pleasant obese caucasian male, NAD. Neuro: Alert and oriented X 3. Moves all extremities spontaneously. Psych: Normal affect. HEENT:  Normal  Neck: Supple without bruits or JVD. Lungs:  Resp regular and unlabored, CTA. Heart: RRR no s3, s4, or murmurs. Abdomen: Soft, non-tender, non-distended, BS + x 4.  Extremities: No clubbing, cyanosis or edema. DP/PT/Radials 2+ and equal bilaterally.  Labs    CBC  Recent Labs  07/28/16 1725  WBC 7.6  HGB 13.3  HCT 41.5  MCV 90.6  PLT 123456   Basic Metabolic Panel  Recent Labs  07/28/16 1725 07/29/16 0624  NA 139 138  K 4.1  3.9  CL 106 105  CO2 23 24  GLUCOSE 187* 171*  BUN 23* 16  CREATININE 1.45* 1.20  CALCIUM 9.3 9.0   Liver Function Tests No results for input(s): AST, ALT, ALKPHOS, BILITOT, PROT, ALBUMIN in the last 72 hours. No results for input(s): LIPASE, AMYLASE in the last 72 hours. Cardiac Enzymes  Recent Labs  07/29/16 0052 07/29/16 0624  TROPONINI 0.47* 1.36*   BNP Invalid input(s): POCBNP D-Dimer No results for input(s): DDIMER in the last 72 hours. Hemoglobin A1C No results for input(s): HGBA1C in the last 72 hours. Fasting Lipid Panel  Recent Labs  07/29/16 0624  CHOL 144  HDL 37*  LDLCALC 67  TRIG 202*  CHOLHDL 3.9   Thyroid Function Tests  Recent Labs  07/29/16 0052  TSH 1.951    Telemetry    Atrial paced, 5 beat run of NSVT  ECG    SR, A-paced LBBB  Radiology  Assessment & Plan    70 year old male has a prior history of CAD with multiple stents, SSS, Afib s/p PPM, diabetes, hypertensive heart disease, hyperlipidemia, obesity and chronic venous insufficiency.  1. Unstable Angina/NSTEMI: Presented with midsternal chest discomfort and tingling in his teeth and jaw that was similar to symptoms he had with previous stent placement. He has ruled in with enzymes and was started on IV heparin. Currently without any chest discomfort. Plan for [facility] today with recommendations following procedure.   2. HTN: Initially controlled on admission, with some elevated readings. May need adjustment if reading remain elevated.  3. DM II: SSI  4. SSS/Afib s/p PPM: Last dose of Xarelto was 2 days ago. Currently on IV heparin  Signed, Reino Bellis NP-C Pager [phone]  Patient examined chart reviewed SSCP like previous angina and elevated Troponin. Xarelto last dose 48 hrs on heparin AGree with cath today   [name]

## 2016-07-29 NOTE — Progress Notes (Addendum)
ANTICOAGULATION CONSULT NOTE - Initial Consult  Pharmacy Consult for Heparin Indication: chest pain/ACS  Allergies  Allergen Reactions  . Chocolate Hives, Shortness Of Breath and Swelling  . Statins Other (See Comments)    Mental changes, muscle aches  . Codeine     Itching  . Oxytetracycline     Flushing in sunlight    Patient Measurements: Height: 5\' 9"  (175.3 cm) Weight: 288 lb 12.8 oz (131 kg) IBW/kg (Calculated) : 70.7 Heparin Dosing Weight: 102 kg  Vital Signs: Temp: 97.8 F (36.6 C) (09/07 0039) Temp Source: Oral (09/07 0039) BP: 170/82 (09/07 0039) Pulse Rate: 69 (09/06 2330)  Labs:  Recent Labs  07/28/16 1725  HGB 13.3  HCT 41.5  PLT 203  CREATININE 1.45*    Estimated Creatinine Clearance: 64.5 mL/min (by C-G formula based on SCr of 1.45 mg/dL).   Medical History: Past Medical History:  Diagnosis Date  . Asthma    "touch q once & awhile" (02/05/2016)  . Chronic venous insufficiency    with prior venous stasis ulcers  . Complication of anesthesia    "when coming out, I choke and get very restless if breathing tube is still in"  . Coronary artery disease    last cath 2008,  remote PCI of the LAD with Lcx/OM bifurcation stenting and proximal RCA stenting  . Diabetes mellitus, type II ([place])    AODM  . Dyslipidemia   . Exogenous obesity    severe  . History of blood transfusion ~ 2015   related to "when they went in to get my kidney stones"  . Hx of colonic polyps 09/2006   inflammatory polyp at hepatic flexure. not adenomatous or malignant.   . Hypertension   . LBBB (left bundle branch block)    He has developed a native LBBB which was seen on his last visit of March 2014 (From OV note 07/03/13)   . Long term (current) use of anticoagulants   . MI (myocardial infarction) ([place]) 1995  . Nephrolithiasis   . OSA on CPAP    "nasal CPAP"  . Osteoarthritis, knee   . PAF (paroxysmal atrial fibrillation) ([place])   . Presence of permanent cardiac  pacemaker 08/28/2008   [employer] Zephyr XL DR 5826, dual chamber, rate responsive. No arrhythmias recorded and he has an excellent threshold.  . SSS (sick sinus syndrome) ([place])   . Statin intolerance    Hx of. Now tolerating Zetia & Livalo well.     Medications:  Prescriptions Prior to Admission  Medication Sig Dispense Refill Last Dose  . alfuzosin (UROXATRAL) 10 MG 24 hr tablet Take 10 mg by mouth daily with breakfast.   07/27/2016 at Unknown time  . hydrochlorothiazide (HYDRODIURIL) 25 MG tablet Take 1 tablet (25 mg total) by mouth daily. 30 tablet 10 07/28/2016 at Unknown time  . Insulin Regular Human (HUMULIN R U-500 KWIKPEN [place]) Inject 50 units of lipase into the skin 2 (two) times daily. Patient states he is taking 50 units twice daily   07/28/2016 at Unknown time  . lisinopril (PRINIVIL,ZESTRIL) 40 MG tablet TAKE ONE TABLET BY MOUTH DAILY 90 tablet 1 07/28/2016 at Unknown time  . metFORMIN (GLUCOPHAGE) 500 MG tablet Take 1,000 mg by mouth daily. With meal   07/28/2016 at Unknown time  . nitroGLYCERIN (NITROSTAT) 0.4 MG SL tablet Place 1 tablet (0.4 mg total) under the tongue every 5 (five) minutes as needed for chest pain. 25 tablet 5 07/28/2016 at Unknown time  . omeprazole (  PRILOSEC) 40 MG capsule Take 1 capsule (40 mg total) by mouth daily before breakfast. 90 capsule 3 07/28/2016 at Unknown time  . rivaroxaban (XARELTO) 20 MG TABS tablet Take 1 tablet (20 mg total) by mouth daily with supper. 30 tablet 11 07/27/2016 at 1900  . sotalol (BETAPACE) 120 MG tablet Take 1 tablet (120 mg total) by mouth 2 (two) times daily. 60 tablet 10 07/28/2016 at Unknown time  . traMADol-acetaminophen (ULTRACET) 37.5-325 MG per tablet Take 1 tablet by mouth daily.    07/28/2016 at Unknown time  . dicyclomine (BENTYL) 10 MG capsule Take 1-2 capsules (10-20 mg total) by mouth 4 (four) times daily -  before meals and at bedtime. (Patient not taking: Reported on 07/28/2016) 360 capsule 3 Not Taking at Unknown time  .  HYDROcodone-acetaminophen (NORCO/VICODIN) 5-325 MG tablet Take 1-2 tablets by mouth 2 (two) times daily as needed for moderate pain. (Patient not taking: Reported on 07/28/2016) 45 tablet 0 Not Taking at Unknown time  . rosuvastatin (CRESTOR) 5 MG tablet Take 1 tablet (5 mg total) by mouth 3 (three) times a week. (Patient not taking: Reported on 07/28/2016) 15 tablet 3 Not Taking at Unknown time    Assessment: 70 y.o. M presents with CP. Trop elevated to 0.23. Pt on Xarelto PTA for afib - last dose 9/5 1900. To begin heparin for r/o ACS. Xarelto will likely still be affecting heparin levels so will utilize PTT to monitor heparin for now. Will likely go to cath 9/7.  Goal of Therapy:  Heparin level 0.3-0.7 units/ml; aPTT 66-102 sec Monitor platelets by anticoagulation protocol: Yes   Plan:  Baseline PTT and heparin level Heparin gtt at 1450 units/hr. No bolus. Will f/u PTT in 6 hours Daily heparin level, PTT, and CBC  Sherlon Handing, PharmD, BCPS Clinical pharmacist, pager [phone] 07/29/2016,12:44 AM  Addendum [phone]): Xarelto affecting heparin levels (baseline 0.35) so will utilize PTT to monitor heparin for now. Baseline PTT 27 sec.

## 2016-07-29 NOTE — Interval H&P Note (Signed)
Cath Lab Visit (complete for each Cath Lab visit)  Clinical Evaluation Leading to the Procedure:   ACS: Yes.    Non-ACS:    Anginal Classification: CCS IV  Anti-ischemic medical therapy: Minimal Therapy (1 class of medications)  Non-Invasive Test Results: No non-invasive testing performed  Prior CABG: No previous CABG      History and Physical Interval Note:  07/29/2016 11:57 AM  Charles [name]  has presented today for surgery, with the diagnosis of cp  The various methods of treatment have been discussed with the patient and family. After consideration of risks, benefits and other options for treatment, the patient has consented to  Procedure(s): Left Heart Cath and Coronary Angiography (N/A) as a surgical intervention .  The patient's history has been reviewed, patient examined, no change in status, stable for surgery.  I have reviewed the patient's chart and labs.  Questions were answered to the patient's satisfaction.     Larae Grooms

## 2016-07-29 NOTE — Progress Notes (Signed)
TR BAND REMOVAL  LOCATION:    right radial  DEFLATED PER PROTOCOL:    Yes.    TIME BAND OFF / DRESSING APPLIED:    1830   SITE UPON ARRIVAL:    Level 0  SITE AFTER BAND REMOVAL:    Level 0  CIRCULATION SENSATION AND MOVEMENT:    Within Normal Limits   Yes.    COMMENTS:   1900 dressing dry and intact, no change in assessment.

## 2016-07-29 NOTE — Progress Notes (Signed)
ANTICOAGULATION CONSULT NOTE - Follow Up Consult  Pharmacy Consult for Heparin Indication: chest pain/ACS  Allergies  Allergen Reactions  . Chocolate Hives, Shortness Of Breath and Swelling  . Statins Other (See Comments)    Mental changes, muscle aches  . Codeine     Itching  . Oxytetracycline     Flushing in sunlight    Patient Measurements: Height: 5\' 9"  (175.3 cm) Weight: 288 lb (130.6 kg) IBW/kg (Calculated) : 70.7 Heparin Dosing Weight: 102 kg  Vital Signs: Temp: 97.8 F (36.6 C) (09/07 0824) Temp Source: Oral (09/07 0824) BP: 157/91 (09/07 0824) Pulse Rate: 66 (09/07 0824)  Labs:  Recent Labs  07/28/16 1725 07/29/16 0052 07/29/16 0624 07/29/16 1012  HGB 13.3  --   --   --   HCT 41.5  --   --   --   PLT 203  --   --   --   APTT  --  27 38*  --   HEPARINUNFRC  --  0.35  --  0.32  CREATININE 1.45*  --  1.20  --   TROPONINI  --  0.47* 1.36*  --     Estimated Creatinine Clearance: 77.8 mL/min (by C-G formula based on SCr of 1.2 mg/dL).   Assessment:  Anticoag:  Xarelto PTA for afib - last dose 9/5 1900. To begin heparin for r/o ACS. Xarelto affecting heparin levels (baseline 0.35) so will utilize PTT to monitor heparin for now. Baseline PTT 27 sec. Will likely go to cath 9/7. -HL 1.86, 0.84, 0.35, 0.32 (aPTT 38) this AM -CBC WNL and stable   Goal of Therapy:  Heparin level 0.3-0.7 units/ml Monitor platelets by anticoagulation protocol: Yes   Plan:  Cath today Continue IV heparin at 1450 units/hr Daily HL and CBC    Estel Scholze S. Alford Highland, PharmD, BCPS Clinical Staff Pharmacist Pager [phone]  [place], [place] 07/29/2016,10:56 AM

## 2016-07-30 ENCOUNTER — Encounter (HOSPITAL_COMMUNITY): Payer: Self-pay | Admitting: Interventional Cardiology

## 2016-07-30 DIAGNOSIS — I214 Non-ST elevation (NSTEMI) myocardial infarction: Principal | ICD-10-CM

## 2016-07-30 LAB — CBC
HEMATOCRIT: 36.3 % — AB (ref 39.0–52.0)
HEMOGLOBIN: 12 g/dL — AB (ref 13.0–17.0)
MCH: 29.6 pg (ref 26.0–34.0)
MCHC: 33.1 g/dL (ref 30.0–36.0)
MCV: 89.4 fL (ref 78.0–100.0)
Platelets: 172 10*3/uL (ref 150–400)
RBC: 4.06 MIL/uL — ABNORMAL LOW (ref 4.22–5.81)
RDW: 14.2 % (ref 11.5–15.5)
WBC: 7.2 10*3/uL (ref 4.0–10.5)

## 2016-07-30 LAB — BASIC METABOLIC PANEL
Anion gap: 8 (ref 5–15)
BUN: 15 mg/dL (ref 6–20)
CHLORIDE: 101 mmol/L (ref 101–111)
CO2: 26 mmol/L (ref 22–32)
CREATININE: 1.3 mg/dL — AB (ref 0.61–1.24)
Calcium: 8.7 mg/dL — ABNORMAL LOW (ref 8.9–10.3)
GFR calc Af Amer: >60 mL/min (ref >60)
GFR calc non Af Amer: 54 mL/min — ABNORMAL LOW (ref >60)
Glucose, Bld: 233 mg/dL — ABNORMAL HIGH (ref 65–99)
Potassium: 4.3 mmol/L (ref 3.5–5.1)
Sodium: 135 mmol/L (ref 135–145)

## 2016-07-30 LAB — HEMOGLOBIN A1C
HEMOGLOBIN A1C: 6.5 % — AB (ref 4.8–5.6)
Mean Plasma Glucose: 140 mg/dL

## 2016-07-30 LAB — GLUCOSE, CAPILLARY
GLUCOSE-CAPILLARY: 230 mg/dL — AB (ref 65–99)
GLUCOSE-CAPILLARY: 245 mg/dL — AB (ref 65–99)

## 2016-07-30 MED ORDER — ANGIOPLASTY BOOK
Freq: Once | Status: AC
  Administered 2016-07-30: 02:00:00
  Filled 2016-07-30: qty 1

## 2016-07-30 MED ORDER — CLOPIDOGREL BISULFATE 75 MG PO TABS: 75.0000 mg | ORAL_TABLET | Freq: Every day | ORAL | 6 refills | Status: DC

## 2016-07-30 MED ORDER — HEART ATTACK BOUNCING BOOK
Freq: Once | Status: AC
  Administered 2016-07-30: 02:00:00
  Filled 2016-07-30: qty 1

## 2016-07-30 NOTE — Progress Notes (Signed)
CARDIAC REHAB PHASE I   PRE:  Rate/Rhythm: pacing 70  BP:  Supine:   Sitting: 129/61  Standing:    SaO2:   MODE:  Ambulation: 500 ft   POST:  Rate/Rhythm: pacing 87  BP:  Supine:   Sitting: 147/66  Standing:    SaO2:  0805-0856 Pt walked 500 ft with steady gait and tolerated without CP. Has arthritic knees which limit walking. Discussed MI restrictions, risk factors, modified ex ed due to knees, NTG use. Pt voiced understanding. Discussed CRP 2 and pt will consider doing program. Has never done before. Will refer to [facility].   Graylon Good, RN BSN  07/30/2016 8:53 AM

## 2016-07-30 NOTE — Discharge Summary (Signed)
Discharge Summary    Patient ID: Charles [name],  MRN: [id number], DOB/AGE: [birth year] 70 y.o.  Admit date: 07/28/2016 Discharge date: 07/30/2016  Primary Care Provider: [facility] Primary Cardiologist: Dr. [name]  Discharge Diagnoses    Active Problems:   Chronic venous insufficiency   Old inferior wall myocardial infarction   Cardiac pacemaker in situ   NSTEMI (non-ST elevated myocardial infarction) ([place])   Allergies Allergies  Allergen Reactions  . Chocolate Hives, Shortness Of Breath and Swelling  . Statins Other (See Comments)    Mental changes, muscle aches  . Codeine     Itching  . Oxytetracycline     Flushing in sunlight    Diagnostic Studies/Procedures    LHC: 07/29/16  Conclusion     Patent RCA stent.  Patent LAD stent.  Restenosis of the stent in the Ost 2nd Mrg to 2nd Mrg lesion, 70 %stenosed.  Patent proximal to mid circumflex stent  RPDA lesion, 75 %stenosed.  In-stent Restenosis of the mid to distal circumflex stent, 99 %stenosed. This is likely the culprit for his non-STEMI. Post intervention with balloon angioplasty, there is a 10% residual stenosis.  In-stent Restenosis of the Ost 1st Diag to 1st Diag lesion, 80 %stenosed. Post intervention with balloon angioplasty, there is a 10% residual stenosis.  LV end diastolic pressure is mildly elevated.  Unable to advance Angiosculpt scoring balloon through previously placed stents in the circumflex and LAD to the target areas.   Diffuse small vessel disease. Balloon angioplasty to areas of restenosis. Continue aggressive medical therapy. We'll give him a short-term course of clopidogrel, likely 30 days. Could extend if he tolerates the clopidogrel with Xarelto and does not have bleeding issues. Likely restart Xarelto tomorrow.   _____________   History of Present Illness     70 year old male has a prior history of diabetes hypertensive heart disease hyperlipidemia and obesity and  chronic venous insufficiency.  He has a previous posterior myocardial infarction treated with angioplasty in 1995 and later had restenosis treated again with angioplasty. In 1998 he had bifurcation stenting of the circumflex and first marginal as well as a stent to the LAD which were bare metal stents.    In 2002 he had stenting of the right coronary artery with 2 bare metal stents in 2 places.  In 2008 he had a Promus drug-eluting stent placed to the circumflex by Dr. Claiborne Billings which was a 3.5 x 15 mm stent.  He had done fairly well since then but developed sick sinus syndrome as well as paroxysmal atrial fibrillation.  He had a pacemaker implanted previously and also has been on sotalol which is helping maintain sinus rhythm.  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  He had the onset tonight of midsternal chest discomfort with a tingling feeling in his teeth and jaw reminiscent of his previous issues.  He tried 2 nitroglycerin at home without relief and was transported to the hospital.  An EKG showed a paced rhythm and troponins were initially elevated.  His discomfort abated but then returned and again has abated at the present time.  He has been on Xarelto with the last dose being done last evening.  He has had recent a rotator cuff issues and has had severe pain involving his right shoulder.  He normally denies PND or orthopnea.  He wears pressure support stockings and has chronic venous insufficiency normally.  Hospital Course  Consultants: None  Given his report of symptoms, he was admitted overnight with his enzymes cycled with plans for LHC the following morning. He reported his last dose of Xarelto prior to admission was 24 hours. He was started on IV heparin, with his HCTZ and metformin held.   He underwent LHC with Dr. Okey Regal 07/29/16 showing patent stents to the RCA, Prox-Mid Crx and LAD, with 70% restenosis of the stent in the Ost 2nd Mrg to  2nd Mrg lesion, 99% in stent stenosis to the mid-distal Crx (likely the culprit for NSTEMI) with balloon angioplasty, and 80% stenosis to the Ost 1st diag lesion with balloon angioplasty. Mildly elevated LVEDP. Plan was to continue with aggressive medical therapy and begin Plavix along with Xarelto for at least 30 days and to extend out if no bleeding complications arise. He was observed overnight without any complications. His troponin peaked at 4.99, and Cr was stable at 1.30 post cath.   On 9/8 he was seen by Dr. Johnsie Cancel and determined stable for discharge home. He will resume his Xarelto this evening along with Plavix. His blood pressure was noted to be mildly elevated during this admission, so I have encouraged him to monitor this at home and bring his readings to his follow up visit. Of note he reports a statin intolerance, but currently taking Crestor 5mg  3 times a week and tolerating ok.  _____________  Discharge Vitals Blood pressure (!) 150/72, pulse 70, temperature (!) 96 F (35.6 C), temperature source Oral, resp. rate 15, height 5\' 9"  (1.753 m), weight 288 lb 9.3 oz (130.9 kg), SpO2 97 %.  Filed Weights   07/29/16 0039 07/29/16 0535 07/30/16 0453  Weight: 288 lb 12.8 oz (131 kg) 288 lb (130.6 kg) 288 lb 9.3 oz (130.9 kg)    Labs & Radiologic Studies    CBC  Recent Labs  07/28/16 1725 07/29/16 1823 07/30/16 0312  WBC 7.6  --  7.2  HGB 13.3  --  12.0*  HCT 41.5  --  36.3*  MCV 90.6  --  89.4  PLT 203 195 [id number]   Basic Metabolic Panel  Recent Labs  07/29/16 0624 07/30/16 0312  NA 138 135  K 3.9 4.3  CL 105 101  CO2 24 26  GLUCOSE 171* 233*  BUN 16 15  CREATININE 1.20 1.30*  CALCIUM 9.0 8.7*   Liver Function Tests No results for input(s): AST, ALT, ALKPHOS, BILITOT, PROT, ALBUMIN in the last 72 hours. No results for input(s): LIPASE, AMYLASE in the last 72 hours. Cardiac Enzymes  Recent Labs  07/29/16 0052 07/29/16 0624 07/29/16 1823  TROPONINI 0.47* 1.36*  4.99*   BNP Invalid input(s): POCBNP D-Dimer No results for input(s): DDIMER in the last 72 hours. Hemoglobin A1C  Recent Labs  07/29/16 0052  HGBA1C 6.5*   Fasting Lipid Panel  Recent Labs  07/29/16 0624  CHOL 144  HDL 37*  LDLCALC 67  TRIG 202*  CHOLHDL 3.9   Thyroid Function Tests  Recent Labs  07/29/16 0052  TSH 1.951   _____________  Dg Chest 2 View  Result Date: 07/28/2016 CLINICAL DATA:  Chest pressure, jaw pain. EXAM: CHEST  2 VIEW COMPARISON:  CT chest 10/16/2015 and chest radiograph 12/14/2008. FINDINGS: Trachea is midline. Heart size stable. Thoracic aorta is calcified. Left subclavian pacemaker lead tips are in the right atrium and right ventricle. Lungs are clear. No pleural fluid. IMPRESSION: 1. No acute findings. 2. Aortic atherosclerosis. Electronically Signed   By: Lorin Picket  M.D.   On: 07/28/2016 17:52   Disposition   Pt is being discharged home today in good condition.  Follow-up Plans & Appointments    Follow-up Information    Truitt Merle, NP Follow up on 08/09/2016.   Specialties:  Nurse Practitioner, Interventional Cardiology, Cardiology, Radiology Why:  1:30pm for your hospital follow up with our nurse practitioner Cecille Rubin. Contact information: [place]. [address] [place] [address] [phone]          Discharge Instructions    Amb Referral to Cardiac Rehabilitation    Complete by:  As directed   Diagnosis:   NSTEMI PTCA     Call MD for:  redness, tenderness, or signs of infection (pain, swelling, redness, odor or green/yellow discharge around incision site)    Complete by:  As directed   Diet - low sodium heart healthy    Complete by:  As directed   Discharge instructions    Complete by:  As directed   Radial Site Care Refer to this sheet in the next few weeks. These instructions provide you with information on caring for yourself after your procedure. Your caregiver may also give you more specific instructions.  Your treatment has been planned according to current medical practices, but problems sometimes occur. Call your caregiver if you have any problems or questions after your procedure. HOME CARE INSTRUCTIONS You may shower the day after the procedure.Remove the bandage (dressing) and gently wash the site with plain soap and water.Gently pat the site dry.  Do not apply powder or lotion to the site.  Do not submerge the affected site in water for 3 to 5 days.  Inspect the site at least twice daily.  Do not flex or bend the affected arm for 24 hours.  No lifting over 5 pounds (2.3 kg) for 5 days after your procedure.  Do not drive home if you are discharged the same day of the procedure. Have someone else drive you.  You may drive 24 hours after the procedure unless otherwise instructed by your caregiver.  What to expect: Any bruising will usually fade within 1 to 2 weeks.  Blood that collects in the tissue (hematoma) may be painful to the touch. It should usually decrease in size and tenderness within 1 to 2 weeks.  SEEK IMMEDIATE MEDICAL CARE IF: You have unusual pain at the radial site.  You have redness, warmth, swelling, or pain at the radial site.  You have drainage (other than a small amount of blood on the dressing).  You have chills.  You have a fever or persistent symptoms for more than 72 hours.  You have a fever and your symptoms suddenly get worse.  Your arm becomes pale, cool, tingly, or numb.  You have heavy bleeding from the site. Hold pressure on the site.   Increase activity slowly    Complete by:  As directed      Discharge Medications   Current Discharge Medication List    START taking these medications   Details  clopidogrel (PLAVIX) 75 MG tablet Take 1 tablet (75 mg total) by mouth daily with breakfast. Qty: 30 tablet, Refills: 6      CONTINUE these medications which have NOT CHANGED   Details  alfuzosin (UROXATRAL) 10 MG 24 hr tablet Take 10 mg by mouth daily  with breakfast.    hydrochlorothiazide (HYDRODIURIL) 25 MG tablet Take 1 tablet (25 mg total) by mouth daily. Qty: 30 tablet, Refills: 10  Insulin Regular Human (HUMULIN R U-500 KWIKPEN [place]) Inject 50 units of lipase into the skin 2 (two) times daily. Patient states he is taking 50 units twice daily    lisinopril (PRINIVIL,ZESTRIL) 40 MG tablet TAKE ONE TABLET BY MOUTH DAILY Qty: 90 tablet, Refills: 1    metFORMIN (GLUCOPHAGE) 500 MG tablet Take 1,000 mg by mouth daily. With meal    nitroGLYCERIN (NITROSTAT) 0.4 MG SL tablet Place 1 tablet (0.4 mg total) under the tongue every 5 (five) minutes as needed for chest pain. Qty: 25 tablet, Refills: 5    omeprazole (PRILOSEC) 40 MG capsule Take 1 capsule (40 mg total) by mouth daily before breakfast. Qty: 90 capsule, Refills: 3    rivaroxaban (XARELTO) 20 MG TABS tablet Take 1 tablet (20 mg total) by mouth daily with supper. Qty: 30 tablet, Refills: 11   Associated Diagnoses: Paroxysmal atrial fibrillation ([facility])    sotalol (BETAPACE) 120 MG tablet Take 1 tablet (120 mg total) by mouth 2 (two) times daily. Qty: 60 tablet, Refills: 10    traMADol-acetaminophen (ULTRACET) 37.5-325 MG per tablet Take 1 tablet by mouth daily.     dicyclomine (BENTYL) 10 MG capsule Take 1-2 capsules (10-20 mg total) by mouth 4 (four) times daily -  before meals and at bedtime. Qty: 360 capsule, Refills: 3    rosuvastatin (CRESTOR) 5 MG tablet Take 1 tablet (5 mg total) by mouth 3 (three) times a week. Qty: 15 tablet, Refills: 3      STOP taking these medications     HYDROcodone-acetaminophen (NORCO/VICODIN) 5-325 MG tablet          Aspirin prescribed at discharge?  No: On Xarelto High Intensity Statin Prescribed? (Lipitor 40-80mg  or Crestor 20-40mg ): No: Statin intolerance Beta Blocker Prescribed? Yes For EF <40%, was ACEI/ARB Prescribed? Yes ADP Receptor Inhibitor Prescribed? (i.e. Plavix etc.-Includes Medically Managed Patients): Yes For EF <40%,  Aldosterone Inhibitor Prescribed? No:  Was EF assessed during THIS hospitalization? Yes Was Cardiac Rehab II ordered? (Included Medically managed Patients): Yes   Outstanding Labs/Studies   F/u BMET at office visit.   Duration of Discharge Encounter   Greater than 30 minutes including physician time.  Signed, Reino Bellis NP-C 07/30/2016, 9:58 AM

## 2016-07-30 NOTE — Progress Notes (Signed)
Patient Name: Charles [name] Date of Encounter: 07/30/2016  Hospital Problem List     Active Problems:   Chronic venous insufficiency   Old inferior wall myocardial infarction   Cardiac pacemaker in situ   NSTEMI (non-ST elevated myocardial infarction) ([place])    Subjective   Feels well today.   Inpatient Medications    . alfuzosin  10 mg Oral QPC supper  . aspirin EC  81 mg Oral Daily  . clopidogrel  75 mg Oral Q breakfast  . insulin aspart  0-20 Units Subcutaneous TID WC  . lisinopril  40 mg Oral Daily  . pantoprazole  40 mg Oral Daily  . rosuvastatin  5 mg Oral Once per day on Mon Wed Fri  . sodium chloride flush  3 mL Intravenous Q12H  . sotalol  120 mg Oral BID    Vital Signs    Vitals:   07/29/16 1730 07/29/16 1800 07/29/16 1926 07/30/16 0453  BP:  (!) 182/67 (!) 162/77 (!) 150/72  Pulse: 70 69 76 70  Resp: (!) 26 20 15 15   Temp: 97.9 F (36.6 C)  98.2 F (36.8 C) (!) 96 F (35.6 C)  TempSrc: Oral  Oral Axillary  SpO2: 100% 97% 96% 97%  Weight:    288 lb 9.3 oz (130.9 kg)  Height:        Intake/Output Summary (Last 24 hours) at 07/30/16 0706 Last data filed at 07/30/16 0630  Gross per 24 hour  Intake              480 ml  Output             1575 ml  Net            -1095 ml   Filed Weights   07/29/16 0039 07/29/16 0535 07/30/16 0453  Weight: 288 lb 12.8 oz (131 kg) 288 lb (130.6 kg) 288 lb 9.3 oz (130.9 kg)    Physical Exam    General: Pleasant obese caucasian male, NAD. Neuro: Alert and oriented X 3. Moves all extremities spontaneously. Psych: Normal affect. HEENT:  Normal  Neck: Supple without bruits or JVD. Lungs:  Resp regular and unlabored, CTA. Heart: RRR no s3, s4, or murmurs. Abdomen: Soft, non-tender, non-distended, BS + x 4.  Extremities: No clubbing, cyanosis or edema. DP/PT/Radials 2+ and equal bilaterally. Right radial site without bruising or hematoma  Labs    CBC  Recent Labs  07/28/16 1725 07/29/16 1823 07/30/16 0312    WBC 7.6  --  7.2  HGB 13.3  --  12.0*  HCT 41.5  --  36.3*  MCV 90.6  --  89.4  PLT 203 195 [id number]   Basic Metabolic Panel  Recent Labs  07/29/16 0624 07/30/16 0312  NA 138 135  K 3.9 4.3  CL 105 101  CO2 24 26  GLUCOSE 171* 233*  BUN 16 15  CREATININE 1.20 1.30*  CALCIUM 9.0 8.7*   Liver Function Tests No results for input(s): AST, ALT, ALKPHOS, BILITOT, PROT, ALBUMIN in the last 72 hours. No results for input(s): LIPASE, AMYLASE in the last 72 hours. Cardiac Enzymes  Recent Labs  07/29/16 0052 07/29/16 0624 07/29/16 1823  TROPONINI 0.47* 1.36* 4.99*   BNP Invalid input(s): POCBNP D-Dimer No results for input(s): DDIMER in the last 72 hours. Hemoglobin A1C  Recent Labs  07/29/16 0052  HGBA1C 6.5*   Fasting Lipid Panel  Recent Labs  07/29/16 0624  CHOL 144  HDL 37*  LDLCALC 34  TRIG 202*  CHOLHDL 3.9   Thyroid Function Tests  Recent Labs  07/29/16 0052  TSH 1.951    Telemetry    A-paced Short run of NSVT  ECG    SR, A-paced LBBB  Radiology      Assessment & Plan    70 year old male has a prior history of CAD with multiple stents, SSS, Afib s/p PPM, diabetes, hypertensive heart disease, hyperlipidemia, obesity and chronic venous insufficiency.  1. Unstable Angina/NSTEMI: Underwent LHC yesterday with Dr. Irish Lack showing patent stents to the RCA, Prox-Mid Crx and LAD, with 70% restenosis of the stent in the Ost 2nd Mrg to 2nd Mrg lesion, 99% in stent stenosis to the mid-distal Crx (likely the culprit for NSTEMI) with balloon angioplasty, and 80% stenosis to the Ost 1st diag lesion with balloon angioplasty. Mildly elevated LVEDP -- Plan to continue with aggressive medical therapy, with short course of Plavix for 30 days, likely to extend if he is able to tolerate both plavix and Xarelto without bleeding issues.   2. HTN: Remains elevated this admission, currently on lisinopril 40mg , and sotalol. May need additional agent.   3. DM II:  SSI  4. SSS/Afib s/p PPM: Plan to resume Xarelto today.   Signed, Reino Bellis NP-C Pager [phone]   Post PCI x2 in stent restenosis Diagonal and OM.  Right radial Cath sight A. No angina Ok to d/c home resume NOAC today Per Dr Valentino Hue to go back to work.    [name]

## 2016-07-30 NOTE — Care Management Note (Signed)
Case Management Note  Patient Details  Name: Charles [name] MRN: [id number] Date of Birth: 09-18-1946  Subjective/Objective:  Patient is s/p coronary balloon angioplasty, already on xarelto, per MD note will give short term of plavix.  Patient for dc today, no needs.                  Action/Plan:   Expected Discharge Date:                  Expected Discharge Plan:  Home/Self Care  In-House Referral:     Discharge planning Services  CM Consult  Post Acute Care Choice:    Choice offered to:     DME Arranged:    DME Agency:     HH Arranged:    HH Agency:     Status of Service:  Completed, signed off  If discussed at [employer] of Stay Meetings, dates discussed:    Additional Comments:  Zenon Mayo, RN 07/30/2016, 10:33 AM

## 2016-08-02 ENCOUNTER — Encounter (HOSPITAL_COMMUNITY): Payer: Self-pay | Admitting: Emergency Medicine

## 2016-08-02 ENCOUNTER — Observation Stay (HOSPITAL_COMMUNITY)
Admission: EM | Admit: 2016-08-02 | Discharge: 2016-08-03 | Disposition: A | Discharge status: Home / Self Care | Payer: BLUE CROSS/BLUE SHIELD | Attending: Internal Medicine | Admitting: Internal Medicine

## 2016-08-02 ENCOUNTER — Emergency Department (HOSPITAL_COMMUNITY): Payer: BLUE CROSS/BLUE SHIELD

## 2016-08-02 DIAGNOSIS — E119 Type 2 diabetes mellitus without complications: Secondary | ICD-10-CM | POA: Insufficient documentation

## 2016-08-02 DIAGNOSIS — Z794 Long term (current) use of insulin: Secondary | ICD-10-CM | POA: Diagnosis not present

## 2016-08-02 DIAGNOSIS — I252 Old myocardial infarction: Secondary | ICD-10-CM | POA: Diagnosis not present

## 2016-08-02 DIAGNOSIS — I495 Sick sinus syndrome: Secondary | ICD-10-CM | POA: Diagnosis not present

## 2016-08-02 DIAGNOSIS — I480 Paroxysmal atrial fibrillation: Secondary | ICD-10-CM | POA: Insufficient documentation

## 2016-08-02 DIAGNOSIS — R0602 Shortness of breath: Secondary | ICD-10-CM | POA: Insufficient documentation

## 2016-08-02 DIAGNOSIS — Z7902 Long term (current) use of antithrombotics/antiplatelets: Secondary | ICD-10-CM | POA: Insufficient documentation

## 2016-08-02 DIAGNOSIS — Z79899 Other long term (current) drug therapy: Secondary | ICD-10-CM | POA: Insufficient documentation

## 2016-08-02 DIAGNOSIS — Z950 Presence of cardiac pacemaker: Secondary | ICD-10-CM | POA: Insufficient documentation

## 2016-08-02 DIAGNOSIS — R7989 Other specified abnormal findings of blood chemistry: Secondary | ICD-10-CM | POA: Diagnosis not present

## 2016-08-02 DIAGNOSIS — Z87891 Personal history of nicotine dependence: Secondary | ICD-10-CM | POA: Diagnosis not present

## 2016-08-02 DIAGNOSIS — E785 Hyperlipidemia, unspecified: Secondary | ICD-10-CM | POA: Diagnosis not present

## 2016-08-02 DIAGNOSIS — Z6841 Body Mass Index (BMI) 40.0 and over, adult: Secondary | ICD-10-CM | POA: Insufficient documentation

## 2016-08-02 DIAGNOSIS — E669 Obesity, unspecified: Secondary | ICD-10-CM | POA: Insufficient documentation

## 2016-08-02 DIAGNOSIS — G4733 Obstructive sleep apnea (adult) (pediatric): Secondary | ICD-10-CM | POA: Diagnosis not present

## 2016-08-02 DIAGNOSIS — R0789 Other chest pain: Principal | ICD-10-CM | POA: Insufficient documentation

## 2016-08-02 DIAGNOSIS — E1169 Type 2 diabetes mellitus with other specified complication: Secondary | ICD-10-CM

## 2016-08-02 DIAGNOSIS — R778 Other specified abnormalities of plasma proteins: Secondary | ICD-10-CM

## 2016-08-02 DIAGNOSIS — Z7901 Long term (current) use of anticoagulants: Secondary | ICD-10-CM | POA: Insufficient documentation

## 2016-08-02 DIAGNOSIS — I2510 Atherosclerotic heart disease of native coronary artery without angina pectoris: Secondary | ICD-10-CM | POA: Insufficient documentation

## 2016-08-02 DIAGNOSIS — R079 Chest pain, unspecified: Secondary | ICD-10-CM | POA: Diagnosis present

## 2016-08-02 LAB — BASIC METABOLIC PANEL
ANION GAP: 8 (ref 5–15)
BUN: 17 mg/dL (ref 6–20)
CHLORIDE: 105 mmol/L (ref 101–111)
CO2: 25 mmol/L (ref 22–32)
Calcium: 9.3 mg/dL (ref 8.9–10.3)
Creatinine, Ser: 1.31 mg/dL — ABNORMAL HIGH (ref 0.61–1.24)
GFR calc non Af Amer: 54 mL/min — ABNORMAL LOW (ref >60)
GLUCOSE: 203 mg/dL — AB (ref 65–99)
POTASSIUM: 4.8 mmol/L (ref 3.5–5.1)
Sodium: 138 mmol/L (ref 135–145)

## 2016-08-02 LAB — CBC
HEMATOCRIT: 37.6 % — AB (ref 39.0–52.0)
HEMOGLOBIN: 12 g/dL — AB (ref 13.0–17.0)
MCH: 29.2 pg (ref 26.0–34.0)
MCHC: 31.9 g/dL (ref 30.0–36.0)
MCV: 91.5 fL (ref 78.0–100.0)
Platelets: 202 10*3/uL (ref 150–400)
RBC: 4.11 MIL/uL — AB (ref 4.22–5.81)
RDW: 14.4 % (ref 11.5–15.5)
WBC: 7 10*3/uL (ref 4.0–10.5)

## 2016-08-02 LAB — I-STAT TROPONIN, ED: Troponin i, poc: 0.99 ng/mL (ref 0.00–0.08)

## 2016-08-02 LAB — CBG MONITORING, ED: GLUCOSE-CAPILLARY: 158 mg/dL — AB (ref 65–99)
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
## 2016-08-02 NOTE — ED Triage Notes (Signed)
Pt here with chest tightness and discomfort. Pt had same discomfort last week when he was admitted for 3 days and had 2 stents placed. Pt also reports SOB.
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
## 2016-08-03 ENCOUNTER — Observation Stay (HOSPITAL_BASED_OUTPATIENT_CLINIC_OR_DEPARTMENT_OTHER): Payer: BLUE CROSS/BLUE SHIELD

## 2016-08-03 DIAGNOSIS — I160 Hypertensive urgency: Secondary | ICD-10-CM

## 2016-08-03 DIAGNOSIS — I2510 Atherosclerotic heart disease of native coronary artery without angina pectoris: Secondary | ICD-10-CM | POA: Diagnosis not present

## 2016-08-03 DIAGNOSIS — R0602 Shortness of breath: Secondary | ICD-10-CM | POA: Diagnosis not present

## 2016-08-03 DIAGNOSIS — R0789 Other chest pain: Secondary | ICD-10-CM | POA: Diagnosis not present

## 2016-08-03 DIAGNOSIS — E785 Hyperlipidemia, unspecified: Secondary | ICD-10-CM

## 2016-08-03 DIAGNOSIS — R079 Chest pain, unspecified: Secondary | ICD-10-CM

## 2016-08-03 DIAGNOSIS — R7989 Other specified abnormal findings of blood chemistry: Secondary | ICD-10-CM

## 2016-08-03 DIAGNOSIS — E119 Type 2 diabetes mellitus without complications: Secondary | ICD-10-CM | POA: Diagnosis not present

## 2016-08-03 DIAGNOSIS — I214 Non-ST elevation (NSTEMI) myocardial infarction: Secondary | ICD-10-CM

## 2016-08-03 LAB — TROPONIN I
TROPONIN I: 0.77 ng/mL — AB (ref <0.03)
TROPONIN I: 0.63 ng/mL — AB (ref <0.03)
Troponin I: 1 ng/mL (ref <0.03)
Troponin I: 1.05 ng/mL (ref <0.03)

## 2016-08-03 LAB — CBC
HEMATOCRIT: 40.8 % (ref 39.0–52.0)
HEMOGLOBIN: 13.3 g/dL (ref 13.0–17.0)
MCH: 29.4 pg (ref 26.0–34.0)
MCHC: 32.6 g/dL (ref 30.0–36.0)
MCV: 90.1 fL (ref 78.0–100.0)
Platelets: 209 10*3/uL (ref 150–400)
RBC: 4.53 MIL/uL (ref 4.22–5.81)
RDW: 14.3 % (ref 11.5–15.5)
WBC: 6.8 10*3/uL (ref 4.0–10.5)

## 2016-08-03 LAB — ECHOCARDIOGRAM COMPLETE
CHL CUP RV SYS PRESS: 20 mmHg
CHL CUP TV REG PEAK VELOCITY: 204 cm/s
E decel time: 148 msec
E/e' ratio: 13.01
FS: 5 % — AB (ref 28–44)
HEIGHTINCHES: 69 in
IV/PV OW: 1.12
LA diam end sys: 38 mm
LA vol A4C: 60.2 ml
LADIAMINDEX: 1.57 cm/m2
LASIZE: 38 mm
LAVOL: 58.3 mL
LAVOLIN: 24.1 mL/m2
LV PW d: 15.6 mm — AB (ref 0.6–1.1)
LV TDI E'LATERAL: 3.99
LVEEAVG: 13.01
LVEEMED: 13.01
LVELAT: 3.99 cm/s
LVOT area: 3.46 cm2
LVOT diameter: 21 mm
MV Dec: 148
MVPKAVEL: 71.8 m/s
MVPKEVEL: 51.9 m/s
TDI e' medial: 3.44
TRMAXVEL: 204 cm/s
WEIGHTICAEL: 4635.2 [oz_av]

## 2016-08-03 LAB — BASIC METABOLIC PANEL
Anion gap: 9 (ref 5–15)
BUN: 13 mg/dL (ref 6–20)
CALCIUM: 9.7 mg/dL (ref 8.9–10.3)
CHLORIDE: 102 mmol/L (ref 101–111)
CO2: 27 mmol/L (ref 22–32)
CREATININE: 1.19 mg/dL (ref 0.61–1.24)
GFR calc non Af Amer: >60 mL/min (ref >60)
Glucose, Bld: 203 mg/dL — ABNORMAL HIGH (ref 65–99)
Potassium: 4.1 mmol/L (ref 3.5–5.1)
SODIUM: 138 mmol/L (ref 135–145)

## 2016-08-03 LAB — GLUCOSE, CAPILLARY
GLUCOSE-CAPILLARY: 215 mg/dL — AB (ref 65–99)
Glucose-Capillary: 142 mg/dL — ABNORMAL HIGH (ref 65–99)
Glucose-Capillary: 190 mg/dL — ABNORMAL HIGH (ref 65–99)
Glucose-Capillary: 308 mg/dL — ABNORMAL HIGH (ref 65–99)

## 2016-08-03 LAB — LIPID PANEL
Cholesterol: 125 mg/dL (ref 0–200)
HDL: 42 mg/dL (ref >40)
LDL CALC: 55 mg/dL (ref 0–99)
Total CHOL/HDL Ratio: 3 RATIO
Triglycerides: 141 mg/dL (ref <150)
VLDL: 28 mg/dL (ref 0–40)

## 2016-08-03 LAB — BRAIN NATRIURETIC PEPTIDE: B Natriuretic Peptide: 188.5 pg/mL — ABNORMAL HIGH (ref 0.0–100.0)

## 2016-08-03 MED ORDER — PERFLUTREN LIPID MICROSPHERE
INTRAVENOUS | Status: AC
  Filled 2016-08-03: qty 10

## 2016-08-03 MED ORDER — ROSUVASTATIN CALCIUM 10 MG PO TABS: 5.0000 mg | ORAL_TABLET | ORAL | Status: DC

## 2016-08-03 MED ORDER — PANTOPRAZOLE SODIUM 40 MG PO TBEC
80.0000 mg | DELAYED_RELEASE_TABLET | Freq: Every day | ORAL | Status: DC
  Administered 2016-08-03: 80 mg via ORAL
  Filled 2016-08-03: qty 2

## 2016-08-03 MED ORDER — ASPIRIN EC 81 MG PO TBEC: 81.0000 mg | DELAYED_RELEASE_TABLET | Freq: Every day | ORAL | Status: DC

## 2016-08-03 MED ORDER — DICYCLOMINE HCL 10 MG PO CAPS
10.0000 mg | ORAL_CAPSULE | Freq: Three times a day (TID) | ORAL | Status: DC
  Administered 2016-08-03: 20 mg via ORAL
  Filled 2016-08-03 (×4): qty 2

## 2016-08-03 MED ORDER — HYDROCHLOROTHIAZIDE 25 MG PO TABS
25.0000 mg | ORAL_TABLET | Freq: Every day | ORAL | Status: DC
  Administered 2016-08-03: 25 mg via ORAL
  Filled 2016-08-03: qty 1

## 2016-08-03 MED ORDER — ALFUZOSIN HCL ER 10 MG PO TB24
10.0000 mg | ORAL_TABLET | Freq: Every day | ORAL | Status: DC
  Filled 2016-08-03: qty 1

## 2016-08-03 MED ORDER — NITROGLYCERIN 0.4 MG SL SUBL: 0.4000 mg | SUBLINGUAL_TABLET | SUBLINGUAL | Status: DC | PRN

## 2016-08-03 MED ORDER — ISOSORBIDE MONONITRATE ER 30 MG PO TB24: 30.0000 mg | ORAL_TABLET | Freq: Every day | ORAL | Status: DC

## 2016-08-03 MED ORDER — SOTALOL HCL 80 MG PO TABS
120.0000 mg | ORAL_TABLET | Freq: Two times a day (BID) | ORAL | Status: DC
  Administered 2016-08-03 (×2): 120 mg via ORAL
  Filled 2016-08-03 (×2): qty 2

## 2016-08-03 MED ORDER — ISOSORBIDE MONONITRATE ER 60 MG PO TB24: 90.0000 mg | ORAL_TABLET | Freq: Every day | ORAL | 11 refills | Status: DC

## 2016-08-03 MED ORDER — RIVAROXABAN 20 MG PO TABS
20.0000 mg | ORAL_TABLET | Freq: Every day | ORAL | Status: DC
  Administered 2016-08-03: 20 mg via ORAL
  Filled 2016-08-03: qty 1

## 2016-08-03 MED ORDER — ASPIRIN 300 MG RE SUPP: 300.0000 mg | RECTAL | Status: AC

## 2016-08-03 MED ORDER — HYDROCODONE-ACETAMINOPHEN 5-325 MG PO TABS: 1.0000 | ORAL_TABLET | Freq: Four times a day (QID) | ORAL | Status: DC | PRN

## 2016-08-03 MED ORDER — ACETAMINOPHEN 325 MG PO TABS: 650.0000 mg | ORAL_TABLET | ORAL | Status: DC | PRN

## 2016-08-03 MED ORDER — ISOSORBIDE MONONITRATE ER 60 MG PO TB24
60.0000 mg | ORAL_TABLET | Freq: Every day | ORAL | Status: DC
  Administered 2016-08-03: 60 mg via ORAL
  Filled 2016-08-03: qty 1

## 2016-08-03 MED ORDER — CLOPIDOGREL BISULFATE 75 MG PO TABS
75.0000 mg | ORAL_TABLET | Freq: Every day | ORAL | Status: DC
  Administered 2016-08-03: 75 mg via ORAL
  Filled 2016-08-03: qty 1

## 2016-08-03 MED ORDER — TRAMADOL-ACETAMINOPHEN 37.5-325 MG PO TABS: 1.0000 | ORAL_TABLET | Freq: Four times a day (QID) | ORAL | Status: DC | PRN

## 2016-08-03 MED ORDER — FUROSEMIDE 10 MG/ML IJ SOLN
20.0000 mg | Freq: Every day | INTRAMUSCULAR | Status: DC
  Administered 2016-08-03: 20 mg via INTRAVENOUS

## 2016-08-03 MED ORDER — ASPIRIN 81 MG PO CHEW
324.0000 mg | CHEWABLE_TABLET | ORAL | Status: AC
  Administered 2016-08-03: 324 mg via ORAL
  Filled 2016-08-03: qty 4

## 2016-08-03 MED ORDER — PANTOPRAZOLE SODIUM 40 MG PO TBEC: 40.0000 mg | DELAYED_RELEASE_TABLET | Freq: Every day | ORAL | 11 refills | Status: DC

## 2016-08-03 MED ORDER — LISINOPRIL 40 MG PO TABS
40.0000 mg | ORAL_TABLET | Freq: Every day | ORAL | Status: DC
  Administered 2016-08-03: 40 mg via ORAL
  Filled 2016-08-03: qty 1

## 2016-08-03 MED ORDER — ISOSORBIDE MONONITRATE ER 60 MG PO TB24: 60.0000 mg | ORAL_TABLET | Freq: Every day | ORAL | Status: DC

## 2016-08-03 MED ORDER — ONDANSETRON HCL 4 MG/2ML IJ SOLN: 4.0000 mg | Freq: Four times a day (QID) | INTRAMUSCULAR | Status: DC | PRN

## 2016-08-03 MED ORDER — INSULIN REGULAR HUMAN (CONC) 500 UNIT/ML ~~LOC~~ SOPN
50.0000 [IU] | PEN_INJECTOR | Freq: Two times a day (BID) | SUBCUTANEOUS | Status: DC
  Administered 2016-08-03: 50 [IU] via SUBCUTANEOUS
  Filled 2016-08-03: qty 3

## 2016-08-03 MED ORDER — PERFLUTREN LIPID MICROSPHERE
1.0000 mL | INTRAVENOUS | Status: AC | PRN
  Administered 2016-08-03: 2 mL via INTRAVENOUS
  Filled 2016-08-03: qty 10

## 2016-08-03 NOTE — Progress Notes (Signed)
Patient Name: Charles [name] Date of Encounter: 08/03/2016  Active Problems:   Chest pain   Length of Stay: 0  SUBJECTIVE  Patient is currently asymptomatic chest pain-free.  CURRENT MEDS . alfuzosin  10 mg Oral Q breakfast  . [START ON 08/04/2016] aspirin EC  81 mg Oral Daily  . clopidogrel  75 mg Oral Q breakfast  . dicyclomine  10-20 mg Oral TID AC & HS  . furosemide  20 mg Intravenous Daily  . hydrochlorothiazide  25 mg Oral Daily  . insulin regular human CONCENTRATED  50 Units Subcutaneous BID WC  . isosorbide mononitrate  30 mg Oral Daily  . lisinopril  40 mg Oral Daily  . pantoprazole  80 mg Oral Daily  . rivaroxaban  20 mg Oral Q supper  . [START ON 08/04/2016] rosuvastatin  5 mg Oral Once per day on Mon Wed Fri  . sotalol  120 mg Oral BID    OBJECTIVE  Vitals:   08/03/16 0100 08/03/16 0156 08/03/16 0312 08/03/16 0520  BP: (!) 161/105 (!) 167/74  (!) 151/72  Pulse: 69 69 72 69  Resp: 11 18 18 18   Temp:  97.9 F (36.6 C)  97.7 F (36.5 C)  TempSrc:  Oral  Oral  SpO2: 96% 100% 100% 98%  Weight:  289 lb 11.2 oz (131.4 kg)    Height:  5\' 9"  (1.753 m)     No intake or output data in the 24 hours ending 08/03/16 1027 Filed Weights   08/03/16 0156  Weight: 289 lb 11.2 oz (131.4 kg)    PHYSICAL EXAM  General: Pleasant, NAD. Neuro: Alert and oriented X 3. Moves all extremities spontaneously. Psych: Normal affect. HEENT:  Normal  Neck: Supple without bruits or JVD. Lungs:  Resp regular and unlabored, CTA. Heart: RRR no s3, s4, or murmurs. Abdomen: Soft, non-tender, non-distended, BS + x 4.  Extremities: No clubbing, cyanosis or edema. DP/PT/Radials 2+ and equal bilaterally.  Accessory Clinical Findings  CBC  Recent Labs  08/02/16 1828 08/03/16 0849  WBC 7.0 6.8  HGB 12.0* 13.3  HCT 37.6* 40.8  MCV 91.5 90.1  PLT 202 [id number]   Basic Metabolic Panel  Recent Labs  08/02/16 1828 08/03/16 0849  NA 138 138  K 4.8 4.1  CL 105 102  CO2 25 27    GLUCOSE 203* 203*  BUN 17 13  CREATININE 1.31* 1.19  CALCIUM 9.3 9.7    Recent Labs  08/02/16 2321 08/03/16 0207 08/03/16 0849  TROPONINI 1.00* 1.05* 0.77*    Recent Labs  08/03/16 0207  CHOL 125  HDL 42  LDLCALC 55  TRIG 141  CHOLHDL 3.0   Radiology/Studies  Dg Chest 2 View  Result Date: 08/02/2016 CLINICAL DATA:  Central chest pain and shortness of breath for 2 hours today. Initial encounter. EXAM: CHEST  2 VIEW COMPARISON:  PA and lateral chest 07/28/2016.  CT chest 10/16/2015. FINDINGS: There is cardiomegaly. Pacing device is in place. Lungs are clear. No pneumothorax or pleural effusion. No focal bony abnormality. IMPRESSION: Cardiomegaly without acute disease. Electronically Signed   By: Inge Rise M.D.   On: 08/02/2016 18:37   Dg Chest 2 View  Result Date: 07/28/2016 CLINICAL DATA:  Chest pressure, jaw pain. EXAM: CHEST  2 VIEW COMPARISON:  CT chest 10/16/2015 and chest radiograph 12/14/2008. FINDINGS: Trachea is midline. Heart size stable. Thoracic aorta is calcified. Left subclavian pacemaker lead tips are in the right atrium and right ventricle. Lungs are  clear. No pleural fluid. IMPRESSION: 1. No acute findings. 2. Aortic atherosclerosis. Electronically Signed   By: Lorin Picket M.D.   On: 07/28/2016 17:52   TELE: SR  ECG: V paced rhythm    ASSESSMENT AND PLAN  23M with chronic venous insufficiency, OSA on CPAP, LBBB, pAF on sotalol/xarelto, SSS s/p dual chamber pacemaker, DM, HTN, HLD, obesity, and CAD s/p multiple prior PCI with recent admission (07/28/16-07/30/16) for NSTEMI (peak TnI 4.99) treated with POBA x 2 (D1 ISR and mid LCx ISR) who presents with shortness of breath, chest tightness,and elevated troponin. 23M with chronic venous insufficiency, OSA on CPAP, LBBB, pAF on sotalol/xarelto, SSS s/p dual chamber pacemaker, DM, HTN, HLD, obesity, and CAD s/p multiple prior PCI with recent admission (07/28/16-07/30/16) for NSTEMI (peak TnI 4.99) treated with  POBA x 2 (D1 ISR and mid LCx ISR) who presents with shortness of breath, chest tightness,and elevated troponin.   History chest pain has resolved, his maximum troponin was 1.0 what is most probably and tail of the event from 9/717 when his troponin was 5. He is currently asymptomatic, however his blood pressure has been elevated. Effusion at the last infection his blood pressure medications were decreased. At this point I would increase his Imdur to 60 mg daily. If his blood pressure is better controlled this afternoon and no recurrent chest pain he will be discharged home and follow-up as an outpatient.  This patient was just admitted this morning at 5 AM however needed medical attention to make further clinical decision based on the lab Center order early this morning therefore LV and other charge later today.   Signed, Ena Dawley MD, [facility] 08/03/2016
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
## 2016-08-03 NOTE — Progress Notes (Signed)
Went over discharge paperwork with patient. Patient states that he understands instructions. Tele and IV removed Berniece Abid A Alphia Behanna, RN

## 2016-08-03 NOTE — Progress Notes (Signed)
  Echocardiogram 2D Echocardiogram has been performed.  Diamond Nickel 08/03/2016, 1:06 PM

## 2016-08-03 NOTE — Progress Notes (Signed)
Patient arrived the unit from the ED, assessment completed see flowsheet, patient oriented to room and staff, placed on tele ccmd notified, bed in lowest position , call light with reach will continue to monitor

## 2016-08-03 NOTE — Discharge Instructions (Signed)
Information on my medicine - Pradaxa® (dabigatran) ° °This medication education was reviewed with me or my healthcare representative as part of my discharge preparation.  The pharmacist that spoke with me during my hospital stay was:  Jaia Alonge Donovan, RPH ° °Why was Pradaxa® prescribed for you? °Pradaxa® was prescribed for you to reduce the risk of forming blood clots that cause a stroke if you have a medical condition called atrial fibrillation (a type of irregular heartbeat).   ° °What do you Need to know about PradAXa®? °Take your Pradaxa® TWICE DAILY - one capsule in the morning and one tablet in the evening with or without food.  It would be best to take the doses about the same time each day. ° °The capsules should not be broken, chewed or opened - they must be swallowed whole. ° °Do not store Pradaxa in other medication containers - once the bottle is opened the Pradaxa should be used within FOUR months; throw away any capsules that haven’t been by that time. ° °Take Pradaxa® exactly as prescribed by your doctor.  DO NOT stop taking Pradaxa® without talking to the doctor who prescribed the medication.  Stopping without other stroke prevention medication to take the place of Pradaxa may increase your risk of developing a clot that causes a stroke.  Refill your prescription before you run out. ° °After discharge, you should have regular check-up appointments with your healthcare provider that is prescribing your Pradaxa®.  In the future your dose may need to be changed if your kidney function or weight changes by a significant amount. ° °What do you do if you miss a dose? °If you miss a dose, take it as soon as you remember on the same day.  If your next dose is less than 6 hours away, skip the missed dose.  Do not take two doses of PRADAXA at the same time. ° °Important Safety Information °A possible side effect of Pradaxa® is bleeding. You should call your healthcare provider right away if you  experience any of the following: °? Bleeding from an injury or your nose that does not stop. °? Unusual colored urine (red or dark brown) or unusual colored stools (red or black). °? Unusual bruising for unknown reasons. °? A serious fall or if you hit your head (even if there is no bleeding). ° °Some medicines may interact with Pradaxa® and might increase your risk of bleeding or clotting while on Pradaxa®. To help avoid this, consult your healthcare provider or pharmacist prior to using any new prescription or non-prescription medications, including herbals, vitamins, non-steroidal anti-inflammatory drugs (NSAIDs) and supplements. ° °This website has more information on Pradaxa® (dabigatran): https://www.pradaxa.com ° ° ° °

## 2016-08-03 NOTE — Discharge Summary (Signed)
Discharge Summary    Patient ID: Charles [name],  MRN: [id number], DOB/AGE: 1946-06-14 70 y.o.  Admit date: 08/02/2016 Discharge date: 08/03/2016  Primary Care Provider: [facility] Primary Cardiologist: Dr [name]  Discharge Diagnoses    Active Problems:   Chest pain   Allergies Allergies  Allergen Reactions  . Chocolate Hives, Shortness Of Breath and Swelling  . Statins Other (See Comments)    Mental changes, muscle aches  . Black Pepper [[place]] Hives  . Codeine     Itching  . Oxytetracycline     Flushing in sunlight    Diagnostic Studies/Procedures    ECHO: 09/12 - Left ventricle: Abnormal septal motion The cavity size was mildly   dilated. Wall thickness was increased in a pattern of severe LVH.   Systolic function was mildly reduced. The estimated ejection   fraction was in the range of 45% to 50%. Wall motion was normal;   there were no regional wall motion abnormalities. Doppler   parameters are consistent with abnormal left ventricular   relaxation (grade 1 diastolic dysfunction). - Left atrium: The atrium was mildly dilated. - Atrial septum: No defect or patent foramen ovale was identified. _____________   History of Present Illness     3M with chronic venous insufficiency, OSA on CPAP, LBBB, pAF on sotalol/xarelto, SSS s/p St Jude PPM, DM, HTN, HLD, obesity, and CAD s/p multiple prior PCI with recent admission (07/28/16-07/30/16) for NSTEMI (peak TnI 4.99) treated with POBA x 2 (D1 ISR and mid LCx ISR) who presents with shortness of breath, chest tightness,and elevated troponin.   Hospital Course     Consultants: none   He was continued on d/c medications. Plavix only because of being on Xarelto. He had been on Prilosec, but this was changed to Protonix because of the Plavix. Enzymes were cycled and were trending down. His CXR was not acute. Other labs are listed below and are not critically abnormal. An echo was ordered.   On 09/12, he was seen  by Dr Meda Coffee and all data were reviewed. His pain had improved and he was otherwise stable. His BP was elevated, but meds were off schedule and were restarted. He was started on Imdur, the dose was increased.  His echo showed 45-50%. Therefore no further inpatient workup was indicated and he is considered stable for discharge, to follow up as an outpatient.  _____________  Discharge Vitals Blood pressure (!) 154/78, pulse 69, temperature 97.7 F (36.5 C), temperature source Oral, resp. rate 18, height 5\' 9"  (1.753 m), weight 289 lb 11.2 oz (131.4 kg), SpO2 98 %.  Filed Weights   08/03/16 0156  Weight: 289 lb 11.2 oz (131.4 kg)    Labs & Radiologic Studies    CBC  Recent Labs  08/02/16 1828 08/03/16 0849  WBC 7.0 6.8  HGB 12.0* 13.3  HCT 37.6* 40.8  MCV 91.5 90.1  PLT 202 [id number]   Basic Metabolic Panel  Recent Labs  08/02/16 1828 08/03/16 0849  NA 138 138  K 4.8 4.1  CL 105 102  CO2 25 27  GLUCOSE 203* 203*  BUN 17 13  CREATININE 1.31* 1.19  CALCIUM 9.3 9.7   Cardiac Enzymes  Recent Labs  08/03/16 0207 08/03/16 0849 08/03/16 1304  TROPONINI 1.05* 0.77* 0.63*  Fasting Lipid Panel  Recent Labs  08/03/16 0207  CHOL 125  HDL 42  LDLCALC 55  TRIG 141  CHOLHDL 3.0  _____________  Dg Chest 2 View Result Date:  08/02/2016 CLINICAL DATA:  Central chest pain and shortness of breath for 2 hours today. Initial encounter. EXAM: CHEST  2 VIEW COMPARISON:  PA and lateral chest 07/28/2016.  CT chest 10/16/2015. FINDINGS: There is cardiomegaly. Pacing device is in place. Lungs are clear. No pneumothorax or pleural effusion. No focal bony abnormality. IMPRESSION: Cardiomegaly without acute disease. Electronically Signed   By: Inge Rise M.D.   On: 08/02/2016 18:37    Disposition   Pt is being discharged home today in good condition.  Follow-up Plans & Appointments    Follow-up Information    Truitt Merle, NP Follow up on 08/09/2016.   Specialties:  Nurse  Practitioner, Interventional Cardiology, Cardiology, Radiology Why:  See provider at 1:30 pm, please arrive 15 minutes early for paperwork. Contact information: [address] [place] [address] [phone]          Discharge Instructions    Diet - low sodium heart healthy    Complete by:  As directed   Diet Carb Modified    Complete by:  As directed   Increase activity slowly    Complete by:  As directed     Discharge Medications   Current Discharge Medication List    START taking these medications   Details  isosorbide mononitrate (IMDUR) 60 MG 24 hr tablet Take 1.5 tablets (90 mg total) by mouth daily. Qty: 45 tablet, Refills: 11    pantoprazole (PROTONIX) 40 MG tablet Take 1 tablet (40 mg total) by mouth daily. Qty: 30 tablet, Refills: 11      CONTINUE these medications which have NOT CHANGED   Details  alfuzosin (UROXATRAL) 10 MG 24 hr tablet Take 10 mg by mouth daily with breakfast.    clopidogrel (PLAVIX) 75 MG tablet Take 1 tablet (75 mg total) by mouth daily with breakfast. Qty: 30 tablet, Refills: 6    dicyclomine (BENTYL) 10 MG capsule Take 1-2 capsules (10-20 mg total) by mouth 4 (four) times daily -  before meals and at bedtime. Qty: 360 capsule, Refills: 3    hydrochlorothiazide (HYDRODIURIL) 25 MG tablet Take 1 tablet (25 mg total) by mouth daily. Qty: 30 tablet, Refills: 10    HYDROcodone-acetaminophen (NORCO/VICODIN) 5-325 MG tablet Take 1 tablet by mouth every 6 (six) hours as needed for moderate pain.    Insulin Regular Human (HUMULIN R U-500 KWIKPEN [place]) Inject 50 units of lipase into the skin 2 (two) times daily. Patient states he is taking 50 units twice daily    lisinopril (PRINIVIL,ZESTRIL) 40 MG tablet TAKE ONE TABLET BY MOUTH DAILY Qty: 90 tablet, Refills: 1    metFORMIN (GLUCOPHAGE) 500 MG tablet Take 1,000 mg by mouth daily. With meal    nitroGLYCERIN (NITROSTAT) 0.4 MG SL tablet Place 1 tablet (0.4 mg total) under the  tongue every 5 (five) minutes as needed for chest pain. Qty: 25 tablet, Refills: 5    rivaroxaban (XARELTO) 20 MG TABS tablet Take 1 tablet (20 mg total) by mouth daily with supper. Qty: 30 tablet, Refills: 11   Associated Diagnoses: Paroxysmal atrial fibrillation (HCC)    rosuvastatin (CRESTOR) 5 MG tablet Take 1 tablet (5 mg total) by mouth 3 (three) times a week. Qty: 15 tablet, Refills: 3    sotalol (BETAPACE) 120 MG tablet Take 1 tablet (120 mg total) by mouth 2 (two) times daily. Qty: 60 tablet, Refills: 10    traMADol-acetaminophen (ULTRACET) 37.5-325 MG per tablet Take 1 tablet by mouth every 6 (six) hours as  needed for moderate pain (pain). For knee pain      STOP taking these medications     omeprazole (PRILOSEC) 40 MG capsule           Outstanding Labs/Studies   none  Duration of Discharge Encounter   Greater than 30 minutes including physician time.  Jonetta Speak NP 08/03/2016, 3:27 PM

## 2016-08-04 LAB — HEMOGLOBIN A1C
HEMOGLOBIN A1C: 7.1 % — AB (ref 4.8–5.6)
Mean Plasma Glucose: 157 mg/dL

## 2016-08-09 ENCOUNTER — Encounter: Payer: Self-pay | Admitting: Physician Assistant

## 2016-08-09 ENCOUNTER — Ambulatory Visit (INDEPENDENT_AMBULATORY_CARE_PROVIDER_SITE_OTHER): Payer: BLUE CROSS/BLUE SHIELD | Admitting: Physician Assistant

## 2016-08-09 VITALS — BP 128/78 | HR 70 | Ht 69.0 in | Wt 289.4 lb

## 2016-08-09 DIAGNOSIS — E785 Hyperlipidemia, unspecified: Secondary | ICD-10-CM | POA: Diagnosis not present

## 2016-08-09 DIAGNOSIS — I480 Paroxysmal atrial fibrillation: Secondary | ICD-10-CM | POA: Diagnosis not present

## 2016-08-09 DIAGNOSIS — I25118 Atherosclerotic heart disease of native coronary artery with other forms of angina pectoris: Secondary | ICD-10-CM | POA: Diagnosis not present

## 2016-08-09 DIAGNOSIS — I259 Chronic ischemic heart disease, unspecified: Secondary | ICD-10-CM | POA: Diagnosis not present

## 2016-08-09 DIAGNOSIS — Z7901 Long term (current) use of anticoagulants: Secondary | ICD-10-CM

## 2016-08-09 DIAGNOSIS — I2510 Atherosclerotic heart disease of native coronary artery without angina pectoris: Secondary | ICD-10-CM | POA: Insufficient documentation

## 2016-08-09 DIAGNOSIS — I10 Essential (primary) hypertension: Secondary | ICD-10-CM
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
## 2016-08-09 NOTE — Patient Instructions (Addendum)
Medication Instructions:  Your physician recommends that you continue on your current medications as directed. Please refer to the Current Medication list given to you today.   Labwork: None ordered  Testing/Procedures: None ordered.  Follow-Up: Follow up as planned in Feb 2018 with Dr.[name]  Any Other Special Instructions Will Be Listed Below (If Applicable).     If you need a refill on your cardiac medications before your next appointment, please call your pharmacy.

## 2016-08-10 ENCOUNTER — Telehealth: Payer: Self-pay | Admitting: Nurse Practitioner

## 2016-08-10 MED ORDER — ISOSORBIDE MONONITRATE ER 60 MG PO TB24: 60.0000 mg | ORAL_TABLET | Freq: Every day | ORAL | 11 refills | Status: DC

## 2016-08-10 NOTE — Telephone Encounter (Signed)
New message    Pt states that he is having a problem with one of his medications.   Pt c/o medication issue:  1. Name of Medication: unsure of what med is giving him the issue he thinks it is his new BP med. Imdur 90 mg  2. How are you currently taking this medication (dosage and times per day)? Takes 1/12 a day  3. Are you having a reaction (difficulty breathing--STAT)? Feeling lightheadedness   4. What is your medication issue? Pt is feeling lightheaded, BP 91/56 has been experiencing this since yesterday

## 2016-08-10 NOTE — Telephone Encounter (Signed)
Per Truitt Merle NP the pt should decrease Isosorbide MN to 60mg  daily and continue to monitor BP.  If the BP remains low and pt continues to have symptoms then he will contact the office for further medication adjustments.  Pt agreed with plan.

## 2016-08-10 NOTE — Telephone Encounter (Signed)
I spoke with the pt and he just checked his vitals a few minutes ago 98/61, pulse 70.  The pt complains of being light headed and also noticed that he was light headed yesterday after his office visit. I reviewed the pt's medication list and he was recently started on Imdur 90 mg daily during his hospitalization. The pt feels like his symptoms are related to this medication. I will discuss this pt with Truitt Merle NP.

## 2016-08-12 DIAGNOSIS — G4733 Obstructive sleep apnea (adult) (pediatric): Secondary | ICD-10-CM | POA: Diagnosis not present

## 2016-08-17 ENCOUNTER — Other Ambulatory Visit: Payer: BLUE CROSS/BLUE SHIELD | Admitting: *Deleted

## 2016-08-17 DIAGNOSIS — I482 Chronic atrial fibrillation, unspecified: Secondary | ICD-10-CM

## 2016-08-17 DIAGNOSIS — E785 Hyperlipidemia, unspecified: Secondary | ICD-10-CM

## 2016-08-17 LAB — BASIC METABOLIC PANEL
BUN: 21 mg/dL (ref 7–25)
CO2: 21 mmol/L (ref 20–31)
Calcium: 9 mg/dL (ref 8.6–10.3)
Chloride: 107 mmol/L (ref 98–110)
Creat: 1.25 mg/dL (ref 0.70–1.25)
Glucose, Bld: 164 mg/dL — ABNORMAL HIGH (ref 65–99)
Potassium: 4.6 mmol/L (ref 3.5–5.3)
Sodium: 139 mmol/L (ref 135–146)

## 2016-08-17 LAB — HEPATIC FUNCTION PANEL
ALT: 12 U/L (ref 9–46)
AST: 9 U/L — ABNORMAL LOW (ref 10–35)
Albumin: 3.8 g/dL (ref 3.6–5.1)
Alkaline Phosphatase: 52 U/L (ref 40–115)
Bilirubin, Direct: 0.1 mg/dL (ref <=0.2)
Indirect Bilirubin: 0.2 mg/dL (ref 0.2–1.2)
Total Bilirubin: 0.3 mg/dL (ref 0.2–1.2)
Total Protein: 6.6 g/dL (ref 6.1–8.1)

## 2016-08-17 LAB — LIPID PANEL
Cholesterol: 122 mg/dL — ABNORMAL LOW (ref 125–200)
HDL: 42 mg/dL (ref >=40)
LDL Cholesterol: 53 mg/dL (ref <130)
Total CHOL/HDL Ratio: 2.9 Ratio (ref <=5.0)
Triglycerides: 133 mg/dL (ref <150)
VLDL: 27 mg/dL (ref <30)

## 2016-08-22 DIAGNOSIS — I200 Unstable angina: Secondary | ICD-10-CM

## 2016-08-22 HISTORY — PX: INSERT / REPLACE / REMOVE PACEMAKER: SUR710

## 2016-08-22 HISTORY — DX: Unstable angina: I20.0

## 2016-09-01 ENCOUNTER — Encounter: Payer: Self-pay | Admitting: Internal Medicine

## 2016-09-07 ENCOUNTER — Emergency Department (HOSPITAL_COMMUNITY): Payer: BLUE CROSS/BLUE SHIELD

## 2016-09-07 ENCOUNTER — Inpatient Hospital Stay (HOSPITAL_COMMUNITY)
Admission: EM | Admit: 2016-09-07 | Discharge: 2016-09-21 | DRG: 234 | Disposition: A | Discharge status: Home / Self Care | Payer: BLUE CROSS/BLUE SHIELD | Attending: Cardiothoracic Surgery | Admitting: Cardiothoracic Surgery

## 2016-09-07 ENCOUNTER — Encounter (HOSPITAL_COMMUNITY): Payer: Self-pay | Admitting: *Deleted

## 2016-09-07 DIAGNOSIS — Z87442 Personal history of urinary calculi: Secondary | ICD-10-CM

## 2016-09-07 DIAGNOSIS — R05 Cough: Secondary | ICD-10-CM | POA: Diagnosis not present

## 2016-09-07 DIAGNOSIS — I252 Old myocardial infarction: Secondary | ICD-10-CM

## 2016-09-07 DIAGNOSIS — Z87891 Personal history of nicotine dependence: Secondary | ICD-10-CM

## 2016-09-07 DIAGNOSIS — Z01810 Encounter for preprocedural cardiovascular examination: Secondary | ICD-10-CM | POA: Diagnosis not present

## 2016-09-07 DIAGNOSIS — Z950 Presence of cardiac pacemaker: Secondary | ICD-10-CM

## 2016-09-07 DIAGNOSIS — E1122 Type 2 diabetes mellitus with diabetic chronic kidney disease: Secondary | ICD-10-CM | POA: Diagnosis not present

## 2016-09-07 DIAGNOSIS — I480 Paroxysmal atrial fibrillation: Secondary | ICD-10-CM

## 2016-09-07 DIAGNOSIS — M19042 Primary osteoarthritis, left hand: Secondary | ICD-10-CM | POA: Diagnosis present

## 2016-09-07 DIAGNOSIS — Z6841 Body Mass Index (BMI) 40.0 and over, adult: Secondary | ICD-10-CM

## 2016-09-07 DIAGNOSIS — E1159 Type 2 diabetes mellitus with other circulatory complications: Secondary | ICD-10-CM | POA: Diagnosis not present

## 2016-09-07 DIAGNOSIS — E1165 Type 2 diabetes mellitus with hyperglycemia: Secondary | ICD-10-CM | POA: Diagnosis not present

## 2016-09-07 DIAGNOSIS — E785 Hyperlipidemia, unspecified: Secondary | ICD-10-CM | POA: Diagnosis present

## 2016-09-07 DIAGNOSIS — Z9989 Dependence on other enabling machines and devices: Secondary | ICD-10-CM

## 2016-09-07 DIAGNOSIS — M19041 Primary osteoarthritis, right hand: Secondary | ICD-10-CM | POA: Diagnosis present

## 2016-09-07 DIAGNOSIS — N183 Chronic kidney disease, stage 3 (moderate): Secondary | ICD-10-CM | POA: Diagnosis not present

## 2016-09-07 DIAGNOSIS — E669 Obesity, unspecified: Secondary | ICD-10-CM | POA: Diagnosis not present

## 2016-09-07 DIAGNOSIS — K219 Gastro-esophageal reflux disease without esophagitis: Secondary | ICD-10-CM | POA: Diagnosis present

## 2016-09-07 DIAGNOSIS — Z9689 Presence of other specified functional implants: Secondary | ICD-10-CM

## 2016-09-07 DIAGNOSIS — I129 Hypertensive chronic kidney disease with stage 1 through stage 4 chronic kidney disease, or unspecified chronic kidney disease: Secondary | ICD-10-CM | POA: Diagnosis not present

## 2016-09-07 DIAGNOSIS — T82855A Stenosis of coronary artery stent, initial encounter: Secondary | ICD-10-CM | POA: Diagnosis not present

## 2016-09-07 DIAGNOSIS — Z23 Encounter for immunization: Secondary | ICD-10-CM

## 2016-09-07 DIAGNOSIS — D696 Thrombocytopenia, unspecified: Secondary | ICD-10-CM | POA: Diagnosis not present

## 2016-09-07 DIAGNOSIS — Z09 Encounter for follow-up examination after completed treatment for conditions other than malignant neoplasm: Secondary | ICD-10-CM

## 2016-09-07 DIAGNOSIS — E6609 Other obesity due to excess calories: Secondary | ICD-10-CM | POA: Diagnosis present

## 2016-09-07 DIAGNOSIS — I872 Venous insufficiency (chronic) (peripheral): Secondary | ICD-10-CM | POA: Diagnosis present

## 2016-09-07 DIAGNOSIS — Z8261 Family history of arthritis: Secondary | ICD-10-CM

## 2016-09-07 DIAGNOSIS — I214 Non-ST elevation (NSTEMI) myocardial infarction: Secondary | ICD-10-CM | POA: Diagnosis present

## 2016-09-07 DIAGNOSIS — Z01818 Encounter for other preprocedural examination: Secondary | ICD-10-CM | POA: Diagnosis not present

## 2016-09-07 DIAGNOSIS — I495 Sick sinus syndrome: Secondary | ICD-10-CM | POA: Diagnosis present

## 2016-09-07 DIAGNOSIS — Z888 Allergy status to other drugs, medicaments and biological substances status: Secondary | ICD-10-CM

## 2016-09-07 DIAGNOSIS — Z7902 Long term (current) use of antithrombotics/antiplatelets: Secondary | ICD-10-CM

## 2016-09-07 DIAGNOSIS — I200 Unstable angina: Secondary | ICD-10-CM | POA: Diagnosis not present

## 2016-09-07 DIAGNOSIS — Z86010 Personal history of colonic polyps: Secondary | ICD-10-CM

## 2016-09-07 DIAGNOSIS — Z794 Long term (current) use of insulin: Secondary | ICD-10-CM

## 2016-09-07 DIAGNOSIS — I5042 Chronic combined systolic (congestive) and diastolic (congestive) heart failure: Secondary | ICD-10-CM | POA: Diagnosis not present

## 2016-09-07 DIAGNOSIS — R079 Chest pain, unspecified: Secondary | ICD-10-CM | POA: Diagnosis not present

## 2016-09-07 DIAGNOSIS — E1169 Type 2 diabetes mellitus with other specified complication: Secondary | ICD-10-CM | POA: Diagnosis not present

## 2016-09-07 DIAGNOSIS — Z8371 Family history of colonic polyps: Secondary | ICD-10-CM

## 2016-09-07 DIAGNOSIS — E119 Type 2 diabetes mellitus without complications: Secondary | ICD-10-CM | POA: Diagnosis present

## 2016-09-07 DIAGNOSIS — I447 Left bundle-branch block, unspecified: Secondary | ICD-10-CM | POA: Diagnosis not present

## 2016-09-07 DIAGNOSIS — G894 Chronic pain syndrome: Secondary | ICD-10-CM | POA: Diagnosis present

## 2016-09-07 DIAGNOSIS — K5900 Constipation, unspecified: Secondary | ICD-10-CM | POA: Diagnosis not present

## 2016-09-07 DIAGNOSIS — Y831 Surgical operation with implant of artificial internal device as the cause of abnormal reaction of the patient, or of later complication, without mention of misadventure at the time of the procedure: Secondary | ICD-10-CM | POA: Diagnosis present

## 2016-09-07 DIAGNOSIS — I080 Rheumatic disorders of both mitral and aortic valves: Secondary | ICD-10-CM | POA: Diagnosis not present

## 2016-09-07 DIAGNOSIS — I2510 Atherosclerotic heart disease of native coronary artery without angina pectoris: Secondary | ICD-10-CM | POA: Diagnosis not present

## 2016-09-07 DIAGNOSIS — I5022 Chronic systolic (congestive) heart failure: Secondary | ICD-10-CM | POA: Diagnosis present

## 2016-09-07 DIAGNOSIS — I10 Essential (primary) hypertension: Secondary | ICD-10-CM | POA: Diagnosis not present

## 2016-09-07 DIAGNOSIS — Z801 Family history of malignant neoplasm of trachea, bronchus and lung: Secondary | ICD-10-CM

## 2016-09-07 DIAGNOSIS — Z955 Presence of coronary angioplasty implant and graft: Secondary | ICD-10-CM

## 2016-09-07 DIAGNOSIS — I340 Nonrheumatic mitral (valve) insufficiency: Secondary | ICD-10-CM | POA: Diagnosis present

## 2016-09-07 DIAGNOSIS — J45909 Unspecified asthma, uncomplicated: Secondary | ICD-10-CM | POA: Diagnosis present

## 2016-09-07 DIAGNOSIS — I255 Ischemic cardiomyopathy: Secondary | ICD-10-CM | POA: Diagnosis present

## 2016-09-07 DIAGNOSIS — G4733 Obstructive sleep apnea (adult) (pediatric): Secondary | ICD-10-CM

## 2016-09-07 DIAGNOSIS — Z823 Family history of stroke: Secondary | ICD-10-CM

## 2016-09-07 DIAGNOSIS — Z885 Allergy status to narcotic agent status: Secondary | ICD-10-CM

## 2016-09-07 DIAGNOSIS — Z4682 Encounter for fitting and adjustment of non-vascular catheter: Secondary | ICD-10-CM | POA: Diagnosis not present

## 2016-09-07 DIAGNOSIS — Z820 Family history of epilepsy and other diseases of the nervous system: Secondary | ICD-10-CM

## 2016-09-07 DIAGNOSIS — Z881 Allergy status to other antibiotic agents status: Secondary | ICD-10-CM

## 2016-09-07 DIAGNOSIS — M170 Bilateral primary osteoarthritis of knee: Secondary | ICD-10-CM | POA: Diagnosis present

## 2016-09-07 DIAGNOSIS — E118 Type 2 diabetes mellitus with unspecified complications: Secondary | ICD-10-CM | POA: Diagnosis not present

## 2016-09-07 DIAGNOSIS — D62 Acute posthemorrhagic anemia: Secondary | ICD-10-CM | POA: Diagnosis not present

## 2016-09-07 DIAGNOSIS — I25110 Atherosclerotic heart disease of native coronary artery with unstable angina pectoris: Secondary | ICD-10-CM | POA: Diagnosis not present

## 2016-09-07 DIAGNOSIS — J90 Pleural effusion, not elsewhere classified: Secondary | ICD-10-CM | POA: Diagnosis not present

## 2016-09-07 DIAGNOSIS — Z951 Presence of aortocoronary bypass graft: Secondary | ICD-10-CM

## 2016-09-07 DIAGNOSIS — Z79899 Other long term (current) drug therapy: Secondary | ICD-10-CM

## 2016-09-07 DIAGNOSIS — IMO0002 Reserved for concepts with insufficient information to code with codable children: Secondary | ICD-10-CM | POA: Diagnosis present

## 2016-09-07 DIAGNOSIS — Z8249 Family history of ischemic heart disease and other diseases of the circulatory system: Secondary | ICD-10-CM

## 2016-09-07 DIAGNOSIS — Z91018 Allergy to other foods: Secondary | ICD-10-CM

## 2016-09-07 HISTORY — DX: Unstable angina: I20.0

## 2016-09-07 LAB — COMPREHENSIVE METABOLIC PANEL
ALT: 17 U/L (ref 17–63)
ANION GAP: 10 (ref 5–15)
AST: 15 U/L (ref 15–41)
Albumin: 4 g/dL (ref 3.5–5.0)
Alkaline Phosphatase: 56 U/L (ref 38–126)
BUN: 23 mg/dL — ABNORMAL HIGH (ref 6–20)
CHLORIDE: 103 mmol/L (ref 101–111)
CO2: 26 mmol/L (ref 22–32)
CREATININE: 1.38 mg/dL — AB (ref 0.61–1.24)
Calcium: 9.5 mg/dL (ref 8.9–10.3)
GFR, EST AFRICAN AMERICAN: 59 mL/min — AB (ref >60)
GFR, EST NON AFRICAN AMERICAN: 51 mL/min — AB (ref >60)
Glucose, Bld: 186 mg/dL — ABNORMAL HIGH (ref 65–99)
POTASSIUM: 4.1 mmol/L (ref 3.5–5.1)
SODIUM: 139 mmol/L (ref 135–145)
Total Bilirubin: 0.6 mg/dL (ref 0.3–1.2)
Total Protein: 7.6 g/dL (ref 6.5–8.1)

## 2016-09-07 LAB — CBC WITH DIFFERENTIAL/PLATELET
Basophils Absolute: 0 10*3/uL (ref 0.0–0.1)
Basophils Relative: 0 %
EOS ABS: 0.2 10*3/uL (ref 0.0–0.7)
EOS PCT: 3 %
HCT: 39.1 % (ref 39.0–52.0)
Hemoglobin: 12.7 g/dL — ABNORMAL LOW (ref 13.0–17.0)
LYMPHS ABS: 2 10*3/uL (ref 0.7–4.0)
LYMPHS PCT: 32 %
MCH: 29.3 pg (ref 26.0–34.0)
MCHC: 32.5 g/dL (ref 30.0–36.0)
MCV: 90.3 fL (ref 78.0–100.0)
MONO ABS: 0.4 10*3/uL (ref 0.1–1.0)
Monocytes Relative: 7 %
Neutro Abs: 3.7 10*3/uL (ref 1.7–7.7)
Neutrophils Relative %: 58 %
PLATELETS: 210 10*3/uL (ref 150–400)
RBC: 4.33 MIL/uL (ref 4.22–5.81)
RDW: 14.2 % (ref 11.5–15.5)
WBC: 6.4 10*3/uL (ref 4.0–10.5)

## 2016-09-07 LAB — GLUCOSE, CAPILLARY
GLUCOSE-CAPILLARY: 243 mg/dL — AB (ref 65–99)
GLUCOSE-CAPILLARY: 150 mg/dL — AB (ref 65–99)
Glucose-Capillary: 106 mg/dL — ABNORMAL HIGH (ref 65–99)
Glucose-Capillary: 134 mg/dL — ABNORMAL HIGH (ref 65–99)
Glucose-Capillary: 239 mg/dL — ABNORMAL HIGH (ref 65–99)

## 2016-09-07 LAB — TROPONIN I
TROPONIN I: 0.35 ng/mL — AB (ref <0.03)
TROPONIN I: 0.52 ng/mL — AB (ref <0.03)
Troponin I: 0.62 ng/mL (ref <0.03)

## 2016-09-07 LAB — APTT
APTT: 29 s (ref 24–36)
aPTT: 39 seconds — ABNORMAL HIGH (ref 24–36)

## 2016-09-07 LAB — MRSA PCR SCREENING: MRSA BY PCR: NEGATIVE

## 2016-09-07 LAB — HEPARIN LEVEL (UNFRACTIONATED)

## 2016-09-07 LAB — I-STAT TROPONIN, ED
TROPONIN I, POC: 0.02 ng/mL (ref 0.00–0.08)
TROPONIN I, POC: 0.08 ng/mL (ref 0.00–0.08)

## 2016-09-07 MED ORDER — NITROGLYCERIN IN D5W 200-5 MCG/ML-% IV SOLN
0.0000 ug/min | Freq: Once | INTRAVENOUS | Status: AC
  Administered 2016-09-07: 5 ug/min via INTRAVENOUS
  Filled 2016-09-07: qty 250

## 2016-09-07 MED ORDER — LISINOPRIL 20 MG PO TABS: 40.0000 mg | ORAL_TABLET | Freq: Every day | ORAL | Status: DC

## 2016-09-07 MED ORDER — HEPARIN (PORCINE) IN NACL 100-0.45 UNIT/ML-% IJ SOLN
1950.0000 [IU]/h | INTRAMUSCULAR | Status: DC
  Administered 2016-09-07: 1600 [IU]/h via INTRAVENOUS
  Administered 2016-09-08: 1950 [IU]/h via INTRAVENOUS
  Filled 2016-09-07 (×2): qty 250

## 2016-09-07 MED ORDER — HYDROCHLOROTHIAZIDE 25 MG PO TABS: 25.0000 mg | ORAL_TABLET | Freq: Every day | ORAL | Status: DC

## 2016-09-07 MED ORDER — ASPIRIN 81 MG PO CHEW
81.0000 mg | CHEWABLE_TABLET | ORAL | Status: AC
  Administered 2016-09-08: 81 mg via ORAL
  Filled 2016-09-07: qty 1

## 2016-09-07 MED ORDER — HYDROCODONE-ACETAMINOPHEN 5-325 MG PO TABS
1.0000 | ORAL_TABLET | Freq: Once | ORAL | Status: AC
  Administered 2016-09-07: 1 via ORAL
  Filled 2016-09-07: qty 1

## 2016-09-07 MED ORDER — ROSUVASTATIN CALCIUM 10 MG PO TABS
5.0000 mg | ORAL_TABLET | ORAL | Status: DC
  Administered 2016-09-08 – 2016-09-20 (×5): 5 mg via ORAL
  Filled 2016-09-07 (×8): qty 1

## 2016-09-07 MED ORDER — ONDANSETRON HCL 4 MG/2ML IJ SOLN: 4.0000 mg | Freq: Four times a day (QID) | INTRAMUSCULAR | Status: DC | PRN

## 2016-09-07 MED ORDER — INFLUENZA VAC SPLIT QUAD 0.5 ML IM SUSY
0.5000 mL | PREFILLED_SYRINGE | INTRAMUSCULAR | Status: AC
  Administered 2016-09-08: 0.5 mL via INTRAMUSCULAR
  Filled 2016-09-07: qty 0.5

## 2016-09-07 MED ORDER — SOTALOL HCL 120 MG PO TABS
120.0000 mg | ORAL_TABLET | Freq: Two times a day (BID) | ORAL | Status: DC
  Administered 2016-09-07 – 2016-09-14 (×16): 120 mg via ORAL
  Filled 2016-09-07 (×18): qty 1

## 2016-09-07 MED ORDER — ASPIRIN 81 MG PO CHEW: 324.0000 mg | CHEWABLE_TABLET | ORAL | Status: DC

## 2016-09-07 MED ORDER — OMEPRAZOLE 20 MG PO CPDR
40.0000 mg | DELAYED_RELEASE_CAPSULE | Freq: Every day | ORAL | Status: DC
  Administered 2016-09-09 – 2016-09-13 (×5): 40 mg via ORAL
  Filled 2016-09-07 (×8): qty 2

## 2016-09-07 MED ORDER — NITROGLYCERIN IN D5W 200-5 MCG/ML-% IV SOLN: 0.0000 ug/min | INTRAVENOUS | Status: DC

## 2016-09-07 MED ORDER — HYDROCODONE-ACETAMINOPHEN 5-325 MG PO TABS
1.0000 | ORAL_TABLET | Freq: Four times a day (QID) | ORAL | Status: DC | PRN
  Administered 2016-09-08 – 2016-09-14 (×6): 1 via ORAL
  Filled 2016-09-07 (×6): qty 1

## 2016-09-07 MED ORDER — PANTOPRAZOLE SODIUM 40 MG PO TBEC
40.0000 mg | DELAYED_RELEASE_TABLET | Freq: Every morning | ORAL | Status: DC
  Administered 2016-09-09 – 2016-09-14 (×6): 40 mg via ORAL
  Filled 2016-09-07 (×7): qty 1

## 2016-09-07 MED ORDER — CLOPIDOGREL BISULFATE 75 MG PO TABS
75.0000 mg | ORAL_TABLET | Freq: Every day | ORAL | Status: DC
  Administered 2016-09-07 – 2016-09-08 (×2): 75 mg via ORAL
  Filled 2016-09-07 (×2): qty 1

## 2016-09-07 MED ORDER — INSULIN ASPART 100 UNIT/ML ~~LOC~~ SOLN
0.0000 [IU] | SUBCUTANEOUS | Status: DC
  Administered 2016-09-07 (×2): 5 [IU] via SUBCUTANEOUS
  Administered 2016-09-07: 2 [IU] via SUBCUTANEOUS
  Administered 2016-09-08: 20:00:00 8 [IU] via SUBCUTANEOUS
  Administered 2016-09-08 (×2): 2 [IU] via SUBCUTANEOUS
  Administered 2016-09-08 (×2): 3 [IU] via SUBCUTANEOUS
  Administered 2016-09-08: 2 [IU] via SUBCUTANEOUS
  Administered 2016-09-09: 5 [IU] via SUBCUTANEOUS
  Administered 2016-09-09: 2 [IU] via SUBCUTANEOUS
  Administered 2016-09-09: 5 [IU] via SUBCUTANEOUS
  Administered 2016-09-09: 2 [IU] via SUBCUTANEOUS
  Administered 2016-09-09: 5 [IU] via SUBCUTANEOUS
  Administered 2016-09-10: 8 [IU] via SUBCUTANEOUS
  Administered 2016-09-10: 3 [IU] via SUBCUTANEOUS

## 2016-09-07 MED ORDER — ACETAMINOPHEN 325 MG PO TABS: 650.0000 mg | ORAL_TABLET | ORAL | Status: DC | PRN

## 2016-09-07 MED ORDER — DICYCLOMINE HCL 10 MG PO CAPS
10.0000 mg | ORAL_CAPSULE | Freq: Three times a day (TID) | ORAL | Status: DC
  Administered 2016-09-07 – 2016-09-10 (×12): 10 mg via ORAL
  Administered 2016-09-10: 20 mg via ORAL
  Administered 2016-09-11 – 2016-09-14 (×14): 10 mg via ORAL
  Filled 2016-09-07 (×3): qty 1
  Filled 2016-09-07: qty 2
  Filled 2016-09-07 (×2): qty 1
  Filled 2016-09-07: qty 2
  Filled 2016-09-07 (×6): qty 1
  Filled 2016-09-07: qty 2
  Filled 2016-09-07: qty 1
  Filled 2016-09-07: qty 2
  Filled 2016-09-07 (×6): qty 1
  Filled 2016-09-07: qty 2
  Filled 2016-09-07 (×4): qty 1
  Filled 2016-09-07: qty 2
  Filled 2016-09-07 (×2): qty 1
  Filled 2016-09-07: qty 2
  Filled 2016-09-07: qty 1

## 2016-09-07 MED ORDER — RIVAROXABAN 20 MG PO TABS: 20.0000 mg | ORAL_TABLET | Freq: Every day | ORAL | Status: DC

## 2016-09-07 MED ORDER — ALFUZOSIN HCL ER 10 MG PO TB24
10.0000 mg | ORAL_TABLET | Freq: Every day | ORAL | Status: DC
  Administered 2016-09-07 – 2016-09-21 (×13): 10 mg via ORAL
  Filled 2016-09-07 (×15): qty 1

## 2016-09-07 MED ORDER — TRAMADOL-ACETAMINOPHEN 37.5-325 MG PO TABS: 1.0000 | ORAL_TABLET | Freq: Four times a day (QID) | ORAL | Status: DC | PRN

## 2016-09-07 MED ORDER — ISOSORBIDE MONONITRATE ER 60 MG PO TB24: 60.0000 mg | ORAL_TABLET | Freq: Every day | ORAL | Status: DC

## 2016-09-07 MED ORDER — ASPIRIN 300 MG RE SUPP: 300.0000 mg | RECTAL | Status: DC

## 2016-09-07 MED ORDER — NITROGLYCERIN 0.4 MG SL SUBL
0.4000 mg | SUBLINGUAL_TABLET | SUBLINGUAL | Status: DC | PRN
  Administered 2016-09-13: 0.4 mg via SUBLINGUAL
  Filled 2016-09-07: qty 1
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
## 2016-09-07 NOTE — Progress Notes (Signed)
CRITICAL VALUE ALERT  Critical value received:  Troponin 0.35  Date of notification:  10/17  Time of notification:  08:25  Critical value read back:Yes.    Nurse who received alert:  Josie Dixon  MD notified:  Sherral Hammers  Time of first page:  08:28

## 2016-09-07 NOTE — ED Notes (Signed)
Pt had two non-medicated stents placed in 1996. In September, pt had a cardiac cath showing re-stenosis of two stents. Pt c/o mild sob and centralized chest discomfort. Pt took nitro x 3 at home with relief lasting ~10mins between doses. Pt also took 324mg  ASA. At present, pt denies chest pain, c/o chest discomfort
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
## 2016-09-07 NOTE — ED Notes (Signed)
Pt requesting pain medication (takes tramadol or Vicodin at home) for chronic R shoulder pain, provider aware

## 2016-09-07 NOTE — ED Triage Notes (Signed)
Patient presents with c/o chest pain - a squeezing type pain  Has used 3 NTG in 1hour.  Also c/o pain to the jaw also with some SOB  Had stent placement in September

## 2016-09-07 NOTE — ED Notes (Signed)
Dr. Gardner at bedside 

## 2016-09-07 NOTE — Progress Notes (Signed)
PROGRESS NOTE    Charles [name]  [id number] DOB: 1946/04/17 DOA: 09/07/2016 PCP: [name], MD   Brief Narrative:  Charles [name] is a 70 y.o. WM PMHx CAD native artery, NSTEMI on 9/6, this was due to in stent restenosis of distal LAD and L circumflex stents which were treated with balloon angioplasty only (no re-stenting). Other numerous stents were patent. Chronic Systolic and Diastolic CHF, PAF (paroxysmal atrial fibrillation), SSS (sick sinus syndrome Pacer present([employer] Zephyr XL DR 5826, dual chamber),  LBBB (left bundle branch block), HTN, HLD, Diabetes Type 2 uncontrolled with Complications, OSA on CPAP, Nephrolithiasis  Over the past couple of days he has had recurrent anginal-like episodes (pain identical to prior cardiac angina) over past couple of mornings which all resolved.  Today however his symptoms did not resolve and so he presents to the ED with severe, squeezing quality, L chest pain with radiation to jaw.  SOB with pain.    Subjective: 10/17 A/O 4, NAD. States negative CP, negative SOB. States has not had chest pain since starting nitro drip at admission. States has had increasing fatigue since discharge from last month(NSTEMI), did not have a chance to attend cardiac rehabilitation. States does not know base weight, nor does he weigh himself daily    Assessment & Plan:   Principal Problem:   Unstable angina (HCC) Active Problems:   CAD (coronary artery disease), native coronary artery   Diabetes mellitus type 2 in obese ([place])   CAD in native artery   Uncontrolled type 2 diabetes mellitus with complication (HCC)   Chronic combined systolic and diastolic CHF (congestive heart failure) (HCC)   Sick sinus syndrome (HCC)   OSA on CPAP   Chronic pain syndrome  Unstable angina/NSTEMI -Patient had not been seen by cardiology at 12:30. Contacted Trish and cardiology had not been contacted at admission. -Cardiology to place patient at the top of the  priority list. -Continue nitro drip -Continue heparin drip -Continue Plavix 75 mg daily -Hold Xarelto -NPO: Cardiac catheterization? -Trending troponins -Crestor 5 movement M/W/F secondary to intolerance  Chronic systolic and diastolic CHF -Strict I&O -Daily weight -Sotalol 120 mg BID  Sick sinus syndrome -Patient currently paced  OSA -CPAP per respiratory  DM type II uncontrolled with complication -[id number] Hemoglobin A1c= 7.1   -Moderate SSI  Chronic pain syndrome/torn rotator cuff/DDD/DJD -PO pain medication    DVT prophylaxis: Heparin drip Code Status: Full Family Communication: None Disposition Plan: Per cardiology   Consultants:  Cardiology pending   Procedures/Significant Events:  9/12 Echocardiogram:Left ventricle: -severe LVH.-LVEF=45% to 50%.  -(grade 1 diastolic dysfunction).    Cultures None  Antimicrobials:  None  Devices None   LINES / TUBES:  None    Continuous Infusions: . heparin    . nitroGLYCERIN       Objective: Vitals:   09/07/16 0600 09/07/16 0700 09/07/16 0900 09/07/16 1044  BP: (!) 144/87 125/70  (!) 166/87  Pulse: 74 70 69 69  Resp: 15 19 11 20   Temp: 97.8 F (36.6 C) 97.7 F (36.5 C)  97.5 F (36.4 C)  TempSrc: Oral Oral  Oral  SpO2: 98% 97% 96% 99%  Weight: 129.3 kg (285 lb 0.9 oz)     Height: 5\' 9"  (1.753 m)       Intake/Output Summary (Last 24 hours) at 09/07/16 1311 Last data filed at 09/07/16 1039  Gross per 24 hour  Intake  0 ml  Output              450 ml  Net             -450 ml   Filed Weights   09/07/16 0031 09/07/16 0600  Weight: 130.6 kg (288 lb) 129.3 kg (285 lb 0.9 oz)    Examination:  General: A/O 4, currently NAD, No acute respiratory distress Eyes: negative scleral hemorrhage, negative anisocoria, negative icterus ENT: Negative Runny nose, negative gingival bleeding, Neck:  Negative scars, masses, torticollis, lymphadenopathy, JVD Lungs: Clear to auscultation  bilaterally without wheezes or crackles Cardiovascular: Regular rate and rhythm (paced) without murmur gallop or rub normal S1 and S2 Abdomen: negative abdominal pain, nondistended, positive soft, bowel sounds, no rebound, no ascites, no appreciable mass Extremities: No significant cyanosis, clubbing, or edema bilateral lower extremities Skin: Negative rashes, lesions, ulcers Psychiatric:  Negative depression, negative anxiety, negative fatigue, negative mania  Central nervous system:  Cranial nerves II through XII intact, tongue/uvula midline, all extremities muscle strength 5/5, sensation intact throughout, negative dysarthria, negative expressive aphasia, negative receptive aphasia.  .     Data Reviewed: Care during the described time interval was provided by me .  I have reviewed this patient's available data, including medical history, events of note, physical examination, and all test results as part of my evaluation. I have personally reviewed and interpreted all radiology studies.  CBC:  Recent Labs Lab 09/07/16 0041  WBC 6.4  NEUTROABS 3.7  HGB 12.7*  HCT 39.1  MCV 90.3  PLT [id number]   Basic Metabolic Panel:  Recent Labs Lab 09/07/16 0041  NA 139  K 4.1  CL 103  CO2 26  GLUCOSE 186*  BUN 23*  CREATININE 1.38*  CALCIUM 9.5   GFR: Estimated Creatinine Clearance: 67.2 mL/min (by C-G formula based on SCr of 1.38 mg/dL (H)). Liver Function Tests:  Recent Labs Lab 09/07/16 0041  AST 15  ALT 17  ALKPHOS 56  BILITOT 0.6  PROT 7.6  ALBUMIN 4.0   No results for input(s): LIPASE, AMYLASE in the last 168 hours. No results for input(s): AMMONIA in the last 168 hours. Coagulation Profile: No results for input(s): INR, PROTIME in the last 168 hours. Cardiac Enzymes:  Recent Labs Lab 09/07/16 0721  TROPONINI 0.35*   BNP (last 3 results)  Recent Labs  10/21/15 0932  PROBNP 364.0*   HbA1C: No results for input(s): HGBA1C in the last 72 hours. CBG:  Recent  Labs Lab 09/07/16 0804 09/07/16 1128  GLUCAP 106* 134*   Lipid Profile: No results for input(s): CHOL, HDL, LDLCALC, TRIG, CHOLHDL, LDLDIRECT in the last 72 hours. Thyroid Function Tests: No results for input(s): TSH, T4TOTAL, FREET4, T3FREE, THYROIDAB in the last 72 hours. Anemia Panel: No results for input(s): VITAMINB12, FOLATE, FERRITIN, TIBC, IRON, RETICCTPCT in the last 72 hours. Urine analysis:    Component Value Date/Time   COLORURINE AMBER (A) 02/05/2016 0054   APPEARANCEUR CLOUDY (A) 02/05/2016 0054   LABSPEC 1.021 02/05/2016 0054   PHURINE 5.0 02/05/2016 0054   GLUCOSEU 500 (A) 02/05/2016 0054   HGBUR NEGATIVE 02/05/2016 0054   BILIRUBINUR SMALL (A) 02/05/2016 0054   KETONESUR NEGATIVE 02/05/2016 0054   PROTEINUR NEGATIVE 02/05/2016 0054   UROBILINOGEN 0.2 06/15/2009 1125   NITRITE NEGATIVE 02/05/2016 0054   LEUKOCYTESUR NEGATIVE 02/05/2016 0054   Sepsis Labs: @LABRCNTIP (procalcitonin:4,lacticidven:4)  ) Recent Results (from the past 240 hour(s))  MRSA PCR Screening     Status: None  Collection Time: 09/07/16  7:22 AM  Result Value Ref Range Status   MRSA by PCR NEGATIVE NEGATIVE Final    Comment:        The GeneXpert MRSA Assay (FDA approved for NASAL specimens only), is one component of a comprehensive MRSA colonization surveillance program. It is not intended to diagnose MRSA infection nor to guide or monitor treatment for MRSA infections.          Radiology Studies: Dg Chest 2 View  Result Date: 09/07/2016 CLINICAL DATA:  Acute onset of lower mid chest pain and jaw pain. Initial encounter. EXAM: CHEST  2 VIEW COMPARISON:  Chest radiograph performed 08/02/2016 FINDINGS: The lungs are well-aerated. Mild vascular congestion is noted. There is no evidence of pleural effusion or pneumothorax. The heart is borderline normal in size. A pacemaker is noted at the left chest wall, with leads ending at the right atrium and right ventricle. No acute  osseous abnormalities are seen. IMPRESSION: Mild vascular congestion noted.  Lungs remain grossly clear. Electronically Signed   By: Garald Balding M.D.   On: 09/07/2016 01:25        Scheduled Meds: . alfuzosin  10 mg Oral Q breakfast  . clopidogrel  75 mg Oral Q breakfast  . dicyclomine  10-20 mg Oral TID AC & HS  . [START ON 09/08/2016] Influenza vac split quadrivalent PF  0.5 mL Intramuscular Tomorrow-1000  . insulin aspart  0-15 Units Subcutaneous Q4H  . omeprazole  40 mg Oral QAC breakfast  . pantoprazole  40 mg Oral q morning - 10a  . [START ON 09/08/2016] rosuvastatin  5 mg Oral Q M,W,F  . sotalol  120 mg Oral BID   Continuous Infusions: . heparin    . nitroGLYCERIN       LOS: 0 days    Time spent: 40 minutes    WOODS, Geraldo Docker, MD [facility] Pager [phone]   If 7PM-7AM, please contact night-coverage [email or web address] Password [email or web address] 09/07/2016, 1:11 PM

## 2016-09-07 NOTE — Progress Notes (Signed)
ANTICOAGULATION CONSULT NOTE - Initial Consult  Pharmacy Consult for heparin Indication: chest pain/ACS and atrial fibrillation   Assessment: 70 year old male with medical history significant of CAD, with recent NSTEMI on 9/6 presents to [facility] with anginal-like episodes. Patient has history of atrial fibrillation on xarelto prior to admission. His last dose was last night 10/16pm. With positive cardiac enzymes IV heparin has been ordered. Will start this afternoon ~24h from last xarelto dose.  Will utilize aptt testing to monitor anticoagulation as heparin levels will not be an accurate measure given recent anti-Xa medication use.   Goal of Therapy:  Heparin level 0.3-0.7 units/ml aPTT 66-102 seconds Monitor platelets by anticoagulation protocol: Yes   Plan:  Start heparin this afternoon at 1600 units/hr Check aptt 6 hours after start Check baseline aptt/heparin level   Allergies  Allergen Reactions  . Chocolate Hives, Shortness Of Breath and Swelling  . Statins Other (See Comments)    Mental changes, muscle aches  . Black Pepper [[place]] Hives  . Codeine Itching  . Oxytetracycline Other (See Comments)    Flushing in sunlight  . Tape Rash and Other (See Comments)    SKIN IS VERY SENSITIVE!!    Patient Measurements: Height: 5\' 9"  (175.3 cm) Weight: 285 lb 0.9 oz (129.3 kg) IBW/kg (Calculated) : 70.7 Heparin Dosing Weight: 100kg  Vital Signs: Temp: 97.7 F (36.5 C) (10/17 0700) Temp Source: Oral (10/17 0700) BP: 125/70 (10/17 0700) Pulse Rate: 69 (10/17 0900)  Labs:  Recent Labs  09/07/16 0041 09/07/16 0721  HGB 12.7*  --   HCT 39.1  --   PLT 210  --   CREATININE 1.38*  --   TROPONINI  --  0.35*    Estimated Creatinine Clearance: 67.2 mL/min (by C-G formula based on SCr of 1.38 mg/dL (H)).   Medical History: Past Medical History:  Diagnosis Date  . Arthritis    "knees, hands, lower back" (07/29/2016)  . Asthma    "touch q once & awhile" (02/05/2016)  .  Chronic bronchitis ([place])   . Chronic venous insufficiency    with prior venous stasis ulcers  . Complication of anesthesia    "when coming out, I choke and get very restless if breathing tube is still in"  . Coronary artery disease    last cath 2008,  remote PCI of the LAD with Lcx/OM bifurcation stenting and proximal RCA stenting  . Diabetes mellitus, type II ([place])    AODM  . Dyslipidemia   . Exogenous obesity    severe  . History of blood transfusion ~ 2015   related to "when they went in to get my kidney stones"  . Hx of colonic polyps 09/2006   inflammatory polyp at hepatic flexure. not adenomatous or malignant.   . Hypertension   . LBBB (left bundle branch block)    He has developed a native LBBB which was seen on his last visit of March 2014 (From OV note 07/03/13)   . Long term (current) use of anticoagulants   . MI (myocardial infarction) 1995   "mild"  . Nephrolithiasis   . OSA on CPAP    "nasal CPAP" (07/29/2016)  . Osteoarthritis, knee   . PAF (paroxysmal atrial fibrillation) ([place])   . 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 Jude Zephyr XL DR 5826, dual chamber, rate responsive. No arrhythmias recorded and he has an excellent threshold.  . SSS (sick sinus syndrome) ([place])   . Statin intolerance  Hx of. Now tolerating Zetia & Livalo well.      Erin Hearing PharmD., BCPS Clinical Pharmacist Pager [phone] 09/07/2016 10:49 AM

## 2016-09-07 NOTE — Progress Notes (Signed)
Patient has home CPAP with tubing and mask. Patient states that he can place self on and off. RT made pt aware that if he needed any help to call anytime.
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
## 2016-09-08 ENCOUNTER — Encounter (HOSPITAL_COMMUNITY): Payer: Self-pay | Admitting: Interventional Cardiology

## 2016-09-08 ENCOUNTER — Encounter (HOSPITAL_COMMUNITY)
Admission: EM | Disposition: A | Discharge status: Home / Self Care | Payer: Self-pay | Source: Home / Self Care | Attending: Cardiothoracic Surgery

## 2016-09-08 ENCOUNTER — Other Ambulatory Visit: Payer: Self-pay | Admitting: *Deleted

## 2016-09-08 DIAGNOSIS — I200 Unstable angina: Secondary | ICD-10-CM

## 2016-09-08 DIAGNOSIS — I2510 Atherosclerotic heart disease of native coronary artery without angina pectoris: Secondary | ICD-10-CM

## 2016-09-08 DIAGNOSIS — I25110 Atherosclerotic heart disease of native coronary artery with unstable angina pectoris: Secondary | ICD-10-CM

## 2016-09-08 HISTORY — PX: CARDIAC CATHETERIZATION: SHX172

## 2016-09-08 LAB — CBC
HCT: 39.9 % (ref 39.0–52.0)
Hemoglobin: 13.2 g/dL (ref 13.0–17.0)
MCH: 29.3 pg (ref 26.0–34.0)
MCHC: 33.1 g/dL (ref 30.0–36.0)
MCV: 88.7 fL (ref 78.0–100.0)
PLATELETS: 181 10*3/uL (ref 150–400)
RBC: 4.5 MIL/uL (ref 4.22–5.81)
RDW: 14 % (ref 11.5–15.5)
WBC: 6.4 10*3/uL (ref 4.0–10.5)

## 2016-09-08 LAB — GLUCOSE, CAPILLARY
GLUCOSE-CAPILLARY: 172 mg/dL — AB (ref 65–99)
GLUCOSE-CAPILLARY: 261 mg/dL — AB (ref 65–99)
GLUCOSE-CAPILLARY: 182 mg/dL — AB (ref 65–99)
Glucose-Capillary: 139 mg/dL — ABNORMAL HIGH (ref 65–99)
Glucose-Capillary: 173 mg/dL — ABNORMAL HIGH (ref 65–99)
Glucose-Capillary: 148 mg/dL — ABNORMAL HIGH (ref 65–99)

## 2016-09-08 LAB — PROTIME-INR
INR: 1.15
Prothrombin Time: 14.7 seconds (ref 11.4–15.2)

## 2016-09-08 LAB — HEPARIN LEVEL (UNFRACTIONATED): Heparin Unfractionated: 0.71 IU/mL — ABNORMAL HIGH (ref 0.30–0.70)

## 2016-09-08 LAB — POCT ACTIVATED CLOTTING TIME: ACTIVATED CLOTTING TIME: 318 s

## 2016-09-08 LAB — APTT: aPTT: 73 seconds — ABNORMAL HIGH (ref 24–36)

## 2016-09-08 SURGERY — LEFT HEART CATH AND CORONARY ANGIOGRAPHY

## 2016-09-08 MED ORDER — HEPARIN (PORCINE) IN NACL 2-0.9 UNIT/ML-% IJ SOLN
INTRAMUSCULAR | Status: DC | PRN
Start: 2016-09-08 — End: 2016-09-08
  Administered 2016-09-08: 1000 mL

## 2016-09-08 MED ORDER — LIDOCAINE HCL (PF) 1 % IJ SOLN
INTRAMUSCULAR | Status: AC
  Filled 2016-09-08: qty 30

## 2016-09-08 MED ORDER — LIDOCAINE HCL (PF) 1 % IJ SOLN
INTRAMUSCULAR | Status: DC | PRN
  Administered 2016-09-08: 5 mL

## 2016-09-08 MED ORDER — FENTANYL CITRATE (PF) 100 MCG/2ML IJ SOLN
INTRAMUSCULAR | Status: DC | PRN
  Administered 2016-09-08 (×2): 50 ug via INTRAVENOUS

## 2016-09-08 MED ORDER — HEPARIN SODIUM (PORCINE) 1000 UNIT/ML IJ SOLN
INTRAMUSCULAR | Status: AC
  Filled 2016-09-08: qty 1

## 2016-09-08 MED ORDER — SODIUM CHLORIDE 0.9 % WEIGHT BASED INFUSION
3.0000 mL/kg/h | INTRAVENOUS | Status: DC
  Administered 2016-09-08: 3 mL/kg/h via INTRAVENOUS

## 2016-09-08 MED ORDER — ADENOSINE 12 MG/4ML IV SOLN
INTRAVENOUS | Status: AC
  Filled 2016-09-08: qty 4

## 2016-09-08 MED ORDER — SODIUM CHLORIDE 0.9 % IV SOLN: 250.0000 mL | INTRAVENOUS | Status: DC | PRN

## 2016-09-08 MED ORDER — ~~LOC~~ CARDIAC SURGERY, PATIENT & FAMILY EDUCATION
Freq: Once | Status: AC
  Administered 2016-09-08: 17:00:00
  Filled 2016-09-08: qty 1

## 2016-09-08 MED ORDER — VERAPAMIL HCL 2.5 MG/ML IV SOLN
INTRAVENOUS | Status: AC
  Filled 2016-09-08: qty 2

## 2016-09-08 MED ORDER — VERAPAMIL HCL 2.5 MG/ML IV SOLN
INTRAVENOUS | Status: DC | PRN
  Administered 2016-09-08: 10 mL via INTRA_ARTERIAL

## 2016-09-08 MED ORDER — ONDANSETRON HCL 4 MG/2ML IJ SOLN: 4.0000 mg | Freq: Four times a day (QID) | INTRAMUSCULAR | Status: DC | PRN

## 2016-09-08 MED ORDER — SODIUM CHLORIDE 0.9% FLUSH: 3.0000 mL | INTRAVENOUS | Status: DC | PRN

## 2016-09-08 MED ORDER — HEPARIN SODIUM (PORCINE) 1000 UNIT/ML IJ SOLN
INTRAMUSCULAR | Status: DC | PRN
  Administered 2016-09-08: 5000 [IU] via INTRAVENOUS
  Administered 2016-09-08: 4500 [IU] via INTRAVENOUS

## 2016-09-08 MED ORDER — SODIUM CHLORIDE 0.9 % IV SOLN
250.0000 mL | INTRAVENOUS | Status: DC | PRN
Start: 2016-09-08 — End: 2016-09-15

## 2016-09-08 MED ORDER — ACETAMINOPHEN 325 MG PO TABS: 650.0000 mg | ORAL_TABLET | ORAL | Status: DC | PRN

## 2016-09-08 MED ORDER — IOPAMIDOL (ISOVUE-370) INJECTION 76%
INTRAVENOUS | Status: AC
  Filled 2016-09-08: qty 100

## 2016-09-08 MED ORDER — ADENOSINE (DIAGNOSTIC) 140MCG/KG/MIN
INTRAVENOUS | Status: DC | PRN
  Administered 2016-09-08: 140 ug/kg/min via INTRAVENOUS

## 2016-09-08 MED ORDER — SODIUM CHLORIDE 0.9% FLUSH
3.0000 mL | Freq: Two times a day (BID) | INTRAVENOUS | Status: DC
  Administered 2016-09-08 – 2016-09-10 (×4): 3 mL via INTRAVENOUS

## 2016-09-08 MED ORDER — SODIUM CHLORIDE 0.9 % WEIGHT BASED INFUSION
1.0000 mL/kg/h | INTRAVENOUS | Status: DC
  Administered 2016-09-08: 1 mL/kg/h via INTRAVENOUS

## 2016-09-08 MED ORDER — SODIUM CHLORIDE 0.9 % WEIGHT BASED INFUSION
3.0000 mL/kg/h | INTRAVENOUS | Status: AC
  Administered 2016-09-08: 3 mL/kg/h via INTRAVENOUS

## 2016-09-08 MED ORDER — MIDAZOLAM HCL 2 MG/2ML IJ SOLN
INTRAMUSCULAR | Status: DC | PRN
  Administered 2016-09-08 (×3): 1 mg via INTRAVENOUS

## 2016-09-08 MED ORDER — FENTANYL CITRATE (PF) 100 MCG/2ML IJ SOLN
INTRAMUSCULAR | Status: AC
  Filled 2016-09-08: qty 2

## 2016-09-08 MED ORDER — MIDAZOLAM HCL 2 MG/2ML IJ SOLN
INTRAMUSCULAR | Status: AC
  Filled 2016-09-08: qty 2

## 2016-09-08 MED ORDER — SODIUM CHLORIDE 0.9% FLUSH: 3.0000 mL | Freq: Two times a day (BID) | INTRAVENOUS | Status: DC

## 2016-09-08 MED ORDER — HEPARIN (PORCINE) IN NACL 2-0.9 UNIT/ML-% IJ SOLN
INTRAMUSCULAR | Status: AC
  Filled 2016-09-08: qty 1000

## 2016-09-08 MED ORDER — HEPARIN (PORCINE) IN NACL 100-0.45 UNIT/ML-% IJ SOLN
1800.0000 [IU]/h | INTRAMUSCULAR | Status: DC
  Administered 2016-09-08: 1950 [IU]/h via INTRAVENOUS
  Administered 2016-09-10: 1800 [IU]/h via INTRAVENOUS
  Administered 2016-09-10: 1950 [IU]/h via INTRAVENOUS
  Administered 2016-09-11 – 2016-09-14 (×2): 1800 [IU]/h via INTRAVENOUS
  Filled 2016-09-08 (×11): qty 250

## 2016-09-08 MED ORDER — ASPIRIN 81 MG PO CHEW
81.0000 mg | CHEWABLE_TABLET | Freq: Every day | ORAL | Status: DC
  Administered 2016-09-09 – 2016-09-14 (×6): 81 mg via ORAL
  Filled 2016-09-08 (×6): qty 1

## 2016-09-08 MED ORDER — NITROGLYCERIN IN D5W 200-5 MCG/ML-% IV SOLN
0.0000 ug/min | INTRAVENOUS | Status: DC
  Administered 2016-09-08 – 2016-09-10 (×2): 10 ug/min via INTRAVENOUS
  Filled 2016-09-08: qty 250

## 2016-09-08 MED ORDER — IOPAMIDOL (ISOVUE-370) INJECTION 76%
INTRAVENOUS | Status: DC | PRN
  Administered 2016-09-08: 150 mL via INTRA_ARTERIAL

## 2016-09-08 SURGICAL SUPPLY — 15 items
CATH INFINITI 5 FR JL3.5 (CATHETERS) ×2 IMPLANT
CATH INFINITI JR4 5F (CATHETERS) ×2 IMPLANT
CATH MICROCATH NAVVUS (MICROCATHETER) ×1 IMPLANT
CATH VISTA GUIDE 6FR XBLAD3.5 (CATHETERS) ×2 IMPLANT
DEVICE RAD COMP TR BAND LRG (VASCULAR PRODUCTS) ×2 IMPLANT
GLIDESHEATH SLEND A-KIT 6F 22G (SHEATH) ×2 IMPLANT
KIT ESSENTIALS PG (KITS) ×2 IMPLANT
KIT HEART LEFT (KITS) ×2 IMPLANT
MICROCATHETER NAVVUS (MICROCATHETER) ×2
PACK CARDIAC CATHETERIZATION (CUSTOM PROCEDURE TRAY) ×2 IMPLANT
TRANSDUCER W/STOPCOCK (MISCELLANEOUS) ×2 IMPLANT
TUBING CIL FLEX 10 FLL-RA (TUBING) ×2 IMPLANT
WIRE ASAHI PROWATER 180CM (WIRE) ×2 IMPLANT
WIRE EMERALD 3MM-J .035X260CM (WIRE) ×2 IMPLANT
WIRE HI TORQ VERSACORE-J 145CM (WIRE) ×2 IMPLANT

## 2016-09-08 NOTE — Progress Notes (Signed)
[facility] for heparin Indication: chest pain/ACS and atrial fibrillation   Assessment: 70 y.o. male with chest pain, h/o Afib and Xarelto on hold, for heparin.  APTT subtherapeutic tonight  Goal of Therapy:  Heparin level 0.3-0.7 units/ml aPTT 66-102 seconds Monitor platelets by anticoagulation protocol: Yes   Plan:  Increase Heparin 1950 units/hr Follow-up am labs.    Allergies  Allergen Reactions  . Chocolate Hives, Shortness Of Breath and Swelling  . Statins Other (See Comments)    Mental changes, muscle aches  . Black Pepper [[place]] Hives  . Codeine Itching  . Oxytetracycline Other (See Comments)    Flushing in sunlight  . Tape Rash and Other (See Comments)    SKIN IS VERY SENSITIVE!!    Patient Measurements: Height: 5\' 9"  (175.3 cm) Weight: 285 lb 0.9 oz (129.3 kg) IBW/kg (Calculated) : 70.7 Heparin Dosing Weight: 100kg  Vital Signs: Temp: 97.9 F (36.6 C) (10/17 2249) Temp Source: Oral (10/17 2249) BP: 147/82 (10/17 2249) Pulse Rate: 74 (10/17 2249)  Labs:  Recent Labs  09/07/16 0041 09/07/16 0721 09/07/16 1116 09/07/16 1444 09/07/16 2002 09/07/16 2300  HGB 12.7*  --   --   --   --   --   HCT 39.1  --   --   --   --   --   PLT 210  --   --   --   --   --   APTT  --   --   --  29  --  39*  HEPARINUNFRC  --   --  >2.20*  --   --   --   CREATININE 1.38*  --   --   --   --   --   TROPONINI  --  0.35*  --  0.62* 0.52*  --     Estimated Creatinine Clearance: 67.2 mL/min (by C-G formula based on SCr of 1.38 mg/dL (H)).  Phillis Knack, PharmD, BCPS  09/08/2016 12:37 AM
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
## 2016-09-08 NOTE — Progress Notes (Signed)
PROGRESS NOTE    Charles [name]  [id number] DOB: [birth year] DOA: 09/07/2016 PCP: [name], MD   Brief Narrative:  Charles [name] is a 70 y.o. WM PMHx CAD native artery, NSTEMI on 9/6, this was due to in stent restenosis of distal LAD and L circumflex stents which were treated with balloon angioplasty only (no re-stenting). Other numerous stents were patent. Chronic Systolic and Diastolic CHF, PAF (paroxysmal atrial fibrillation), SSS (sick sinus syndrome Pacer present(St. Jude Zephyr XL DR 5826, dual chamber),  LBBB (left bundle branch block), HTN, HLD, Diabetes Type 2 uncontrolled with Complications, OSA on CPAP, Nephrolithiasis  Over the past couple of days he has had recurrent anginal-like episodes (pain identical to prior cardiac angina) over past couple of mornings which all resolved.  Today however his symptoms did not resolve and so he presents to the ED with severe, squeezing quality, L chest pain with radiation to jaw.  SOB with pain.    Subjective:  NAD. States negative CP, negative SOB. States has not had chest pain since starting nitro drip at admission.      Assessment & Plan:   Principal Problem:   Unstable angina (HCC) Active Problems:   CAD (coronary artery disease), native coronary artery   Diabetes mellitus type 2 in obese (HCC)   CAD in native artery   Uncontrolled type 2 diabetes mellitus with complication (HCC)   Chronic combined systolic and diastolic CHF (congestive heart failure) (HCC)   Sick sinus syndrome (HCC)   OSA on CPAP   Chronic pain syndrome  Unstable angina/NSTEMI  ECG with no acute ischemic changes. Currently chest pain free on nitro gtt.  -Continue heparin drip -Continue Plavix 75 mg daily -Hold Xarelto -NPO: Cardiac catheterization today at 1:00 -Trending troponins 0.35>0.62>0.52 -Crestor 5 movement M/W/F secondary to intolerance -LDL 53, triglycerides 133 on [id number]  Chronic systolic and diastolic CHF -Strict I&O, last 2-D  echo 08/03/16, EF 45-50%.His EF was 60-65% on echo 05/2014. -Daily weight -Sotalol 120 mg BID  Sick sinus syndrome/paroxysmal A. fib currently atrial paced. Continue sotalol. Holding Xarelto for cath    OSA -CPAP per respiratory  DM type II uncontrolled with complication, stable -[id number] Hemoglobin A1c= 7.1   -Moderate SSI  Chronic pain syndrome/torn rotator cuff/DDD/DJD -PO pain medication    DVT prophylaxis: Heparin drip Code Status: Full Family Communication: None Disposition Plan:  As per cardiology recommendations   Consultants:  Cardiology    Procedures/Significant Events:  9/12 Echocardiogram:Left ventricle: -severe LVH.-LVEF=45% to 50%.  -(grade 1 diastolic dysfunction).    Cultures None  Antimicrobials:  None  Devices None   LINES / TUBES:  None    Continuous Infusions: . sodium chloride 1 mL/kg/hr (09/08/16 0703)  . heparin 1,950 Units/hr (09/08/16 0805)  . nitroGLYCERIN 5 mcg/min (09/08/16 0600)     Objective: Vitals:   09/08/16 0338 09/08/16 0500 09/08/16 0604 09/08/16 0800  BP: 134/79     Pulse: 69     Resp: 14     Temp: 98 F (36.7 C)   98.7 F (37.1 C)  TempSrc: Oral   Oral  SpO2: 97%     Weight:  128.8 kg (283 lb 15.2 oz) 128.8 kg (283 lb 15.2 oz)   Height:        Intake/Output Summary (Last 24 hours) at 09/08/16 0834 Last data filed at 09/08/16 0801  Gross per 24 hour  Intake           604.09 ml  Output  2125 ml  Net         -1520.91 ml   Filed Weights   09/07/16 0600 09/08/16 0500 09/08/16 0604  Weight: 129.3 kg (285 lb 0.9 oz) 128.8 kg (283 lb 15.2 oz) 128.8 kg (283 lb 15.2 oz)    Examination:  General: A/O 4, currently NAD, No acute respiratory distress Eyes: negative scleral hemorrhage, negative anisocoria, negative icterus ENT: Negative Runny nose, negative gingival bleeding, Neck:  Negative scars, masses, torticollis, lymphadenopathy, JVD Lungs: Clear to auscultation bilaterally without wheezes or  crackles Cardiovascular: Regular rate and rhythm (paced) without murmur gallop or rub normal S1 and S2 Abdomen: negative abdominal pain, nondistended, positive soft, bowel sounds, no rebound, no ascites, no appreciable mass Extremities: No significant cyanosis, clubbing, or edema bilateral lower extremities Skin: Negative rashes, lesions, ulcers Psychiatric:  Negative depression, negative anxiety, negative fatigue, negative mania  Central nervous system:  Cranial nerves II through XII intact, tongue/uvula midline, all extremities muscle strength 5/5, sensation intact throughout, negative dysarthria, negative expressive aphasia, negative receptive aphasia.  .     Data Reviewed: Care during the described time interval was provided by me .  I have reviewed this patient's available data, including medical history, events of note, physical examination, and all test results as part of my evaluation. I have personally reviewed and interpreted all radiology studies.  CBC:  Recent Labs Lab 09/07/16 0041  WBC 6.4  NEUTROABS 3.7  HGB 12.7*  HCT 39.1  MCV 90.3  PLT [id number]   Basic Metabolic Panel:  Recent Labs Lab 09/07/16 0041  NA 139  K 4.1  CL 103  CO2 26  GLUCOSE 186*  BUN 23*  CREATININE 1.38*  CALCIUM 9.5   GFR: Estimated Creatinine Clearance: 67.1 mL/min (by C-G formula based on SCr of 1.38 mg/dL (H)). Liver Function Tests:  Recent Labs Lab 09/07/16 0041  AST 15  ALT 17  ALKPHOS 56  BILITOT 0.6  PROT 7.6  ALBUMIN 4.0   No results for input(s): LIPASE, AMYLASE in the last 168 hours. No results for input(s): AMMONIA in the last 168 hours. Coagulation Profile: No results for input(s): INR, PROTIME in the last 168 hours. Cardiac Enzymes:  Recent Labs Lab 09/07/16 0721 09/07/16 1444 09/07/16 2002  TROPONINI 0.35* 0.62* 0.52*   BNP (last 3 results)  Recent Labs  10/21/15 0932  PROBNP 364.0*   HbA1C: No results for input(s): HGBA1C in the last 72  hours. CBG:  Recent Labs Lab 09/07/16 1651 09/07/16 1935 09/07/16 2301 09/08/16 0332 09/08/16 0757  GLUCAP 239* 243* 150* 139* 173*   Lipid Profile: No results for input(s): CHOL, HDL, LDLCALC, TRIG, CHOLHDL, LDLDIRECT in the last 72 hours. Thyroid Function Tests: No results for input(s): TSH, T4TOTAL, FREET4, T3FREE, THYROIDAB in the last 72 hours. Anemia Panel: No results for input(s): VITAMINB12, FOLATE, FERRITIN, TIBC, IRON, RETICCTPCT in the last 72 hours. Urine analysis:    Component Value Date/Time   COLORURINE AMBER (A) 02/05/2016 0054   APPEARANCEUR CLOUDY (A) 02/05/2016 0054   LABSPEC 1.021 02/05/2016 0054   PHURINE 5.0 02/05/2016 0054   GLUCOSEU 500 (A) 02/05/2016 0054   HGBUR NEGATIVE 02/05/2016 0054   BILIRUBINUR SMALL (A) 02/05/2016 0054   KETONESUR NEGATIVE 02/05/2016 0054   PROTEINUR NEGATIVE 02/05/2016 0054   UROBILINOGEN 0.2 06/15/2009 1125   NITRITE NEGATIVE 02/05/2016 0054   LEUKOCYTESUR NEGATIVE 02/05/2016 0054   Sepsis Labs: @LABRCNTIP (procalcitonin:4,lacticidven:4)  ) Recent Results (from the past 240 hour(s))  MRSA PCR Screening  Status: None   Collection Time: 09/07/16  7:22 AM  Result Value Ref Range Status   MRSA by PCR NEGATIVE NEGATIVE Final    Comment:        The GeneXpert MRSA Assay (FDA approved for NASAL specimens only), is one component of a comprehensive MRSA colonization surveillance program. It is not intended to diagnose MRSA infection nor to guide or monitor treatment for MRSA infections.          Radiology Studies: Dg Chest 2 View  Result Date: 09/07/2016 CLINICAL DATA:  Acute onset of lower mid chest pain and jaw pain. Initial encounter. EXAM: CHEST  2 VIEW COMPARISON:  Chest radiograph performed 08/02/2016 FINDINGS: The lungs are well-aerated. Mild vascular congestion is noted. There is no evidence of pleural effusion or pneumothorax. The heart is borderline normal in size. A pacemaker is noted at the left  chest wall, with leads ending at the right atrium and right ventricle. No acute osseous abnormalities are seen. IMPRESSION: Mild vascular congestion noted.  Lungs remain grossly clear. Electronically Signed   By: Garald Balding M.D.   On: 09/07/2016 01:25        Scheduled Meds: . alfuzosin  10 mg Oral Q breakfast  . clopidogrel  75 mg Oral Q breakfast  . dicyclomine  10-20 mg Oral TID AC & HS  . Influenza vac split quadrivalent PF  0.5 mL Intramuscular Tomorrow-1000  . insulin aspart  0-15 Units Subcutaneous Q4H  . omeprazole  40 mg Oral QAC breakfast  . pantoprazole  40 mg Oral q morning - 10a  . rosuvastatin  5 mg Oral Q M,W,F  . sodium chloride flush  3 mL Intravenous Q12H  . sotalol  120 mg Oral BID   Continuous Infusions: . sodium chloride 1 mL/kg/hr (09/08/16 0703)  . heparin 1,950 Units/hr (09/08/16 0805)  . nitroGLYCERIN 5 mcg/min (09/08/16 0600)     LOS: 1 day    Time spent: 49 minutes    Reyne Dumas, MD [facility] Pager [phone]   If 7PM-7AM, please contact night-coverage [email or web address] Password [email or web address] 09/08/2016, 8:34 AM

## 2016-09-08 NOTE — Progress Notes (Signed)
TR BAND REMOVAL  LOCATION:    right radial  DEFLATED PER PROTOCOL:    Yes.    TIME BAND OFF / DRESSING APPLIED:    1900    SITE UPON ARRIVAL:    Level 0  SITE AFTER BAND REMOVAL:    Level 0  CIRCULATION SENSATION AND MOVEMENT:    Within Normal Limits   Yes.    COMMENTS:   Dressing clean, dry and intact

## 2016-09-08 NOTE — Interval H&P Note (Signed)
Cath Lab Visit (complete for each Cath Lab visit)  Clinical Evaluation Leading to the Procedure:   ACS: Yes.    Non-ACS:    Anginal Classification: CCS III  Anti-ischemic medical therapy: Maximal Therapy (2 or more classes of medications)  Non-Invasive Test Results: No non-invasive testing performed  Prior CABG: No previous CABG      History and Physical Interval Note:  09/08/2016 1:56 PM  Charles [name]  has presented today for surgery, with the diagnosis of n stemi  The various methods of treatment have been discussed with the patient and family. After consideration of risks, benefits and other options for treatment, the patient has consented to  Procedure(s): Left Heart Cath and Coronary Angiography (N/A) as a surgical intervention .  The patient's history has been reviewed, patient examined, no change in status, stable for surgery.  I have reviewed the patient's chart and labs.  Questions were answered to the patient's satisfaction.     [name]

## 2016-09-08 NOTE — Progress Notes (Signed)
Patient has home CPAP and will place on when ready. RT will continue to monitor. 

## 2016-09-08 NOTE — Progress Notes (Signed)
[facility] for heparin Indication: chest pain/ACS and atrial fibrillation   Assessment: 70 y.o. male with chest pain, h/o Afib and Xarelto on hold, for heparin.  Aptt this am was at goal at 73, heparin level still likely somewhat elevated at 71 due to recent xarelto.  Post cath with multivessel disease, orders to restart heparin at 8 hours post sheath removal.  Goal of Therapy:  Heparin level 0.3-0.7 units/ml aPTT 66-102 seconds Monitor platelets by anticoagulation protocol: Yes   Plan:  Restart Heparin 1950 units/hr at 2300 tonight Will rely on heparin levels alone in am   Allergies  Allergen Reactions  . Chocolate Hives, Shortness Of Breath and Swelling  . Statins Other (See Comments)    Mental changes, muscle aches  . Black Pepper [[place]] Hives  . Codeine Itching  . Oxytetracycline Other (See Comments)    Flushing in sunlight  . Tape Rash and Other (See Comments)    SKIN IS VERY SENSITIVE!!    Patient Measurements: Height: 5\' 9"  (175.3 cm) Weight: 283 lb 15.2 oz (128.8 kg) IBW/kg (Calculated) : 70.7 Heparin Dosing Weight: 100kg  Vital Signs: Temp: 97.7 F (36.5 C) (10/18 1545) Temp Source: Oral (10/18 1545) BP: 142/86 (10/18 1506) Pulse Rate: 70 (10/18 1506)  Labs:  Recent Labs  09/07/16 0041 09/07/16 0721 09/07/16 1116 09/07/16 1444 09/07/16 2002 09/07/16 2300 09/08/16 1003  HGB 12.7*  --   --   --   --   --  13.2  HCT 39.1  --   --   --   --   --  39.9  PLT 210  --   --   --   --   --  181  APTT  --   --   --  29  --  39* 73*  LABPROT  --   --   --   --   --   --  14.7  INR  --   --   --   --   --   --  1.15  HEPARINUNFRC  --   --  >2.20*  --   --   --  0.71*  CREATININE 1.38*  --   --   --   --   --   --   TROPONINI  --  0.35*  --  0.62* 0.52*  --   --     Estimated Creatinine Clearance: 67.1 mL/min (by C-G formula based on SCr of 1.38 mg/dL (H)).  Erin Hearing PharmD., BCPS Clinical Pharmacist Pager  [phone] 09/08/2016 4:18 PM

## 2016-09-09 ENCOUNTER — Encounter (HOSPITAL_COMMUNITY): Payer: BLUE CROSS/BLUE SHIELD

## 2016-09-09 ENCOUNTER — Inpatient Hospital Stay (HOSPITAL_COMMUNITY): Payer: BLUE CROSS/BLUE SHIELD

## 2016-09-09 ENCOUNTER — Other Ambulatory Visit (HOSPITAL_COMMUNITY): Payer: BLUE CROSS/BLUE SHIELD

## 2016-09-09 DIAGNOSIS — I214 Non-ST elevation (NSTEMI) myocardial infarction: Principal | ICD-10-CM

## 2016-09-09 DIAGNOSIS — I255 Ischemic cardiomyopathy: Secondary | ICD-10-CM | POA: Diagnosis present

## 2016-09-09 DIAGNOSIS — I2510 Atherosclerotic heart disease of native coronary artery without angina pectoris: Secondary | ICD-10-CM

## 2016-09-09 LAB — PULMONARY FUNCTION TEST
FEF 25-75 Post: 2.86 L/sec
FEF 25-75 Pre: 2.89 L/sec
FEF2575-%Change-Post: -1 %
FEF2575-%Pred-Post: 119 %
FEF2575-%Pred-Pre: 120 %
FEV1-%Change-Post: 0 %
FEV1-%Pred-Post: 98 %
FEV1-%Pred-Pre: 97 %
FEV1-Post: 3.1 L
FEV1-Pre: 3.07 L
FEV1FVC-%Change-Post: -1 %
FEV1FVC-%Pred-Pre: 111 %
FEV6-%Change-Post: 2 %
FEV6-%Pred-Post: 94 %
FEV6-%Pred-Pre: 92 %
FEV6-Post: 3.81 L
FEV6-Pre: 3.73 L
FEV6FVC-%Pred-Post: 106 %
FEV6FVC-%Pred-Pre: 106 %
FVC-%Change-Post: 2 %
FVC-%Pred-Post: 89 %
FVC-%Pred-Pre: 87 %
FVC-Post: 3.81 L
FVC-Pre: 3.73 L
Post FEV1/FVC ratio: 81 %
Post FEV6/FVC ratio: 100 %
Pre FEV1/FVC ratio: 82 %
Pre FEV6/FVC Ratio: 100 %

## 2016-09-09 LAB — CBC
HCT: 39 % (ref 39.0–52.0)
HEMOGLOBIN: 13 g/dL (ref 13.0–17.0)
MCH: 29.5 pg (ref 26.0–34.0)
MCHC: 33.3 g/dL (ref 30.0–36.0)
MCV: 88.6 fL (ref 78.0–100.0)
Platelets: 200 10*3/uL (ref 150–400)
RBC: 4.4 MIL/uL (ref 4.22–5.81)
RDW: 13.9 % (ref 11.5–15.5)
WBC: 6.5 10*3/uL (ref 4.0–10.5)

## 2016-09-09 LAB — GLUCOSE, CAPILLARY
GLUCOSE-CAPILLARY: 133 mg/dL — AB (ref 65–99)
GLUCOSE-CAPILLARY: 215 mg/dL — AB (ref 65–99)
GLUCOSE-CAPILLARY: 202 mg/dL — AB (ref 65–99)
GLUCOSE-CAPILLARY: 248 mg/dL — AB (ref 65–99)
Glucose-Capillary: 125 mg/dL — ABNORMAL HIGH (ref 65–99)

## 2016-09-09 LAB — HEPARIN LEVEL (UNFRACTIONATED)
HEPARIN UNFRACTIONATED: 0.53 [IU]/mL (ref 0.30–0.70)
HEPARIN UNFRACTIONATED: 0.84 [IU]/mL — AB (ref 0.30–0.70)

## 2016-09-09 LAB — SURGICAL PCR SCREEN
MRSA, PCR: NEGATIVE
Staphylococcus aureus: NEGATIVE

## 2016-09-09 MED ORDER — LISINOPRIL 20 MG PO TABS
20.0000 mg | ORAL_TABLET | Freq: Every day | ORAL | Status: DC
  Administered 2016-09-09 – 2016-09-13 (×5): 20 mg via ORAL
  Filled 2016-09-09 (×5): qty 1

## 2016-09-09 MED ORDER — ALBUTEROL SULFATE (2.5 MG/3ML) 0.083% IN NEBU
2.5000 mg | INHALATION_SOLUTION | Freq: Once | RESPIRATORY_TRACT | Status: AC
Start: 2016-09-09 — End: 2016-09-09
  Administered 2016-09-09: 2.5 mg via RESPIRATORY_TRACT

## 2016-09-09 MED ORDER — ALBUTEROL SULFATE (2.5 MG/3ML) 0.083% IN NEBU: 2.5000 mg | INHALATION_SOLUTION | Freq: Once | RESPIRATORY_TRACT | Status: DC

## 2016-09-09 MED ORDER — CARVEDILOL 3.125 MG PO TABS: 3.1250 mg | ORAL_TABLET | Freq: Two times a day (BID) | ORAL | Status: DC

## 2016-09-09 NOTE — Progress Notes (Signed)
Attempted to give report to 3w nurse

## 2016-09-09 NOTE — Progress Notes (Signed)
Report given to 3w nurse Marita [place], rn

## 2016-09-09 NOTE — Progress Notes (Signed)
PROGRESS NOTE    Charles [name]  [id number] DOB: Sep 28, 1946 DOA: 09/07/2016 PCP: [name], MD   Brief Narrative:  Charles [name] is a 70 y.o. WM PMHx CAD native artery, NSTEMI on 9/6, this was due to in stent restenosis of distal LAD and L circumflex stents which were treated with balloon angioplasty only (no re-stenting). Other numerous stents were patent. Chronic Systolic and Diastolic CHF, PAF (paroxysmal atrial fibrillation), SSS (sick sinus syndrome Pacer present([employer] Zephyr XL DR 5826, dual chamber),  LBBB (left bundle branch block), HTN, HLD, Diabetes Type 2 uncontrolled with Complications, OSA on CPAP, Nephrolithiasis  Over the past couple of days he has had recurrent anginal-like episodes (pain identical to prior cardiac angina) over past couple of mornings which all resolved.  Today however his symptoms did not resolve and so he presents to the ED with severe, squeezing quality, L chest pain with radiation to jaw.  SOB with pain.    Subjective: 10/19 A/O 4, NAD. States negative CP, negative SOB.     Assessment & Plan:   Principal Problem:   NSTEMI (non-ST elevated myocardial infarction) ([place]) Active Problems:   CAD (coronary artery disease), native coronary artery   Diabetes mellitus type 2 in obese ([place])   Unstable angina ([place])   Coronary artery disease involving native coronary artery of native heart   Uncontrolled type 2 diabetes mellitus with complication ([facility])   Chronic combined systolic and diastolic CHF (congestive heart failure) (H[facility])   Sick sinus syndrome ([facility])   OSA on CPAP   Chronic pain syndrome   Cardiomyopathy, ischemic   Coronary artery disease involving native heart without angina pectoris  Unstable angina/NSTEMI -Patient had not been seen by cardiology at 12:30. Contacted Trish and cardiology had not been contacted at admission. -Continue nitro drip -Continue heparin drip -Hold Plavix -Hold Xarelto -Patient was seen by  cardiothoracic surgery and they recommend CABG. Tentatively planned for Wednesday, October 25  -Crestor 5 movement M/W/F secondary to intolerance  Chronic systolic and diastolic CHF -Strict I&O since admission -1.7 L -Daily weight Filed Weights   09/08/16 0604 09/09/16 0500 09/09/16 1713  Weight: 128.8 kg (283 lb 15.2 oz) 130.5 kg (287 lb 9.6 oz) 130.7 kg (288 lb 3.2 oz)  -Sotalol 120 mg BID  Sick sinus syndrome -Patient currently paced  OSA -CPAP per respiratory  DM type II uncontrolled with complication -[id number] Hemoglobin A1c= 7.1   -Moderate SSI  Chronic pain syndrome/torn rotator cuff/DDD/DJD -PO pain medication    DVT prophylaxis: Heparin drip Code Status: Full Family Communication: None Disposition Plan: Per cardiology   Consultants:  Dr. Burnell Blanks Cardiology Dr.Edward B Gerhardt T CTS    Procedures/Significant Events:  9/12 Echocardiogram:Left ventricle: -severe LVH.-LVEF=45% to 50%.  -(grade 1 diastolic dysfunction).    Cultures None  Antimicrobials:  None  Devices None   LINES / TUBES:  None    Continuous Infusions: . heparin 1,950 Units/hr (09/09/16 1400)  . nitroGLYCERIN 10 mcg/min (09/09/16 1400)     Objective: Vitals:   09/09/16 0800 09/09/16 0805 09/09/16 1122 09/09/16 1713  BP: (!) 128/58 (!) 128/58 123/78 127/82  Pulse:   70 78  Resp:  18 18 20   Temp:  97.2 F (36.2 C) 97.3 F (36.3 C) 97.5 F (36.4 C)  TempSrc:  Oral Oral Oral  SpO2:  100% 100% 99%  Weight:    130.7 kg (288 lb 3.2 oz)  Height:    5\' 9"  (1.753 m)    Intake/Output  Summary (Last 24 hours) at 09/09/16 2058 Last data filed at 09/09/16 1739  Gross per 24 hour  Intake          1240.98 ml  Output             1650 ml  Net          -409.02 ml   Filed Weights   09/08/16 0604 09/09/16 0500 09/09/16 1713  Weight: 128.8 kg (283 lb 15.2 oz) 130.5 kg (287 lb 9.6 oz) 130.7 kg (288 lb 3.2 oz)    Examination:  General: A/O 4, currently NAD, No  acute respiratory distress Eyes: negative scleral hemorrhage, negative anisocoria, negative icterus ENT: Negative Runny nose, negative gingival bleeding, Neck:  Negative scars, masses, torticollis, lymphadenopathy, JVD Lungs: Clear to auscultation bilaterally without wheezes or crackles Cardiovascular: Regular rate and rhythm (paced) without murmur gallop or rub normal S1 and S2 Abdomen: negative abdominal pain, nondistended, positive soft, bowel sounds, no rebound, no ascites, no appreciable mass Extremities: No significant cyanosis, clubbing, or edema bilateral lower extremities Skin: Negative rashes, lesions, ulcers Psychiatric:  Negative depression, negative anxiety, negative fatigue, negative mania  Central nervous system:  Cranial nerves II through XII intact, tongue/uvula midline, all extremities muscle strength 5/5, sensation intact throughout, negative dysarthria, negative expressive aphasia, negative receptive aphasia.  .     Data Reviewed: Care during the described time interval was provided by me .  I have reviewed this patient's available data, including medical history, events of note, physical examination, and all test results as part of my evaluation. I have personally reviewed and interpreted all radiology studies.  CBC:  Recent Labs Lab 09/07/16 0041 09/08/16 1003 09/09/16 0700  WBC 6.4 6.4 6.5  NEUTROABS 3.7  --   --   HGB 12.7* 13.2 13.0  HCT 39.1 39.9 39.0  MCV 90.3 88.7 88.6  PLT 210 181 [id number]   Basic Metabolic Panel:  Recent Labs Lab 09/07/16 0041  NA 139  K 4.1  CL 103  CO2 26  GLUCOSE 186*  BUN 23*  CREATININE 1.38*  CALCIUM 9.5   GFR: Estimated Creatinine Clearance: 67.7 mL/min (by C-G formula based on SCr of 1.38 mg/dL (H)). Liver Function Tests:  Recent Labs Lab 09/07/16 0041  AST 15  ALT 17  ALKPHOS 56  BILITOT 0.6  PROT 7.6  ALBUMIN 4.0   No results for input(s): LIPASE, AMYLASE in the last 168 hours. No results for input(s):  AMMONIA in the last 168 hours. Coagulation Profile:  Recent Labs Lab 09/08/16 1003  INR 1.15   Cardiac Enzymes:  Recent Labs Lab 09/07/16 0721 09/07/16 1444 09/07/16 2002  TROPONINI 0.35* 0.62* 0.52*   BNP (last 3 results)  Recent Labs  10/21/15 0932  PROBNP 364.0*   HbA1C: No results for input(s): HGBA1C in the last 72 hours. CBG:  Recent Labs Lab 09/08/16 2310 09/09/16 0443 09/09/16 0757 09/09/16 1124 09/09/16 1726  GLUCAP 182* 125* 133* 215* 202*   Lipid Profile: No results for input(s): CHOL, HDL, LDLCALC, TRIG, CHOLHDL, LDLDIRECT in the last 72 hours. Thyroid Function Tests: No results for input(s): TSH, T4TOTAL, FREET4, T3FREE, THYROIDAB in the last 72 hours. Anemia Panel: No results for input(s): VITAMINB12, FOLATE, FERRITIN, TIBC, IRON, RETICCTPCT in the last 72 hours. Urine analysis:    Component Value Date/Time   COLORURINE AMBER (A) 02/05/2016 0054   APPEARANCEUR CLOUDY (A) 02/05/2016 0054   LABSPEC 1.021 02/05/2016 0054   PHURINE 5.0 02/05/2016 0054   GLUCOSEU 500 (A) 02/05/2016  [place] 02/05/2016 0054   BILIRUBINUR SMALL (A) 02/05/2016 0054   KETONESUR NEGATIVE 02/05/2016 0054   PROTEINUR NEGATIVE 02/05/2016 0054   UROBILINOGEN 0.2 06/15/2009 1125   NITRITE NEGATIVE 02/05/2016 0054   LEUKOCYTESUR NEGATIVE 02/05/2016 0054   Sepsis Labs: @LABRCNTIP (procalcitonin:4,lacticidven:4)  ) Recent Results (from the past 240 hour(s))  MRSA PCR Screening     Status: None   Collection Time: 09/07/16  7:22 AM  Result Value Ref Range Status   MRSA by PCR NEGATIVE NEGATIVE Final    Comment:        The GeneXpert MRSA Assay (FDA approved for NASAL specimens only), is one component of a comprehensive MRSA colonization surveillance program. It is not intended to diagnose MRSA infection nor to guide or monitor treatment for MRSA infections.   Surgical PCR screen     Status: None   Collection Time: 09/08/16 10:13 PM  Result Value  Ref Range Status   MRSA, PCR NEGATIVE NEGATIVE Final   Staphylococcus aureus NEGATIVE NEGATIVE Final    Comment:        The Xpert SA Assay (FDA approved for NASAL specimens in patients over 31 years of age), is one component of a comprehensive surveillance program.  Test performance has been validated by [facility] for patients greater than or equal to 92 year old. It is not intended to diagnose infection nor to guide or monitor treatment.          Radiology Studies: No results found.      Scheduled Meds: . alfuzosin  10 mg Oral Q breakfast  . aspirin  81 mg Oral Daily  . dicyclomine  10-20 mg Oral TID AC & HS  . insulin aspart  0-15 Units Subcutaneous Q4H  . lisinopril  20 mg Oral Daily  . omeprazole  40 mg Oral QAC breakfast  . pantoprazole  40 mg Oral q morning - 10a  . rosuvastatin  5 mg Oral Q M,W,F  . sodium chloride flush  3 mL Intravenous Q12H  . sotalol  120 mg Oral BID   Continuous Infusions: . heparin 1,950 Units/hr (09/09/16 1400)  . nitroGLYCERIN 10 mcg/min (09/09/16 1400)     LOS: 2 days    Time spent: 40 minutes    Charles [name], Charles Docker, MD [facility] Pager [phone]   If 7PM-7AM, please contact night-coverage [email or web address] Password [email or web address] 09/09/2016, 8:58 PM

## 2016-09-09 NOTE — Progress Notes (Signed)
[id number] Discussed with pt the importance of mobility and IS after surgery. Discussed sternal precautions. Pt demonstrated 2500 ml correctly. Has OHS booklet and care guide given. Put on pre op video to view. Pt stated he has been going to bathroom and walking without difficulty. He does have concern about extubation as he has had a bad experience. Encouraged him to discuss with anesthesiologist. Wife will be available to care for pt 24/7 first week home. We will continue to follow.Graylon Good RN BSN 09/09/2016 3:07 PM

## 2016-09-09 NOTE — Progress Notes (Signed)
[place] for heparin Indication: chest pain/ACS and atrial fibrillation   Assessment: 70 y.o. male with chest pain, h/o Afib and Xarelto on hold, for heparin.   HL therapeutic x1 post-cath, 0.53 today CBC stable wnl, no bleeding noted.   Goal of Therapy:  Heparin level 0.3-0.7 units/ml Monitor platelets by anticoagulation protocol: Yes   Plan:  Continue Heparin 1950 units/hr  Confirmatory Heparin level in 6 hrs at 1600 Daily CBC, heparin level while on heparin Monitor for s/sx bleeding   Allergies  Allergen Reactions  . Chocolate Hives, Shortness Of Breath and Swelling  . Statins Other (See Comments)    Mental changes, muscle aches  . Black Pepper [[place]] Hives  . Codeine Itching  . Oxytetracycline Other (See Comments)    Flushing in sunlight  . Tape Rash and Other (See Comments)    SKIN IS VERY SENSITIVE!!    Patient Measurements: Height: 5\' 9"  (175.3 cm) Weight: 287 lb 9.6 oz (130.5 kg) IBW/kg (Calculated) : 70.7 Heparin Dosing Weight: 100kg  Vital Signs: Temp: 97.2 F (36.2 C) (10/19 0805) Temp Source: Oral (10/19 0805) BP: 128/58 (10/19 0805) Pulse Rate: 73 (10/19 0437)  Labs:  Recent Labs  09/07/16 0041 09/07/16 0721 09/07/16 1116 09/07/16 1444 09/07/16 2002 09/07/16 2300 09/08/16 1003 09/09/16 0700  HGB 12.7*  --   --   --   --   --  13.2 13.0  HCT 39.1  --   --   --   --   --  39.9 39.0  PLT 210  --   --   --   --   --  181 200  APTT  --   --   --  29  --  39* 73*  --   LABPROT  --   --   --   --   --   --  14.7  --   INR  --   --   --   --   --   --  1.15  --   HEPARINUNFRC  --   --  >2.20*  --   --   --  0.71* 0.53  CREATININE 1.38*  --   --   --   --   --   --   --   TROPONINI  --  0.35*  --  0.62* 0.52*  --   --   --     Estimated Creatinine Clearance: 67.6 mL/min (by C-G formula based on SCr of 1.38 mg/dL (H)).  Carlean Jews, Pharm.D. PGY1 Pharmacy Resident 10/19/201711:07 AM Pager  [phone]

## 2016-09-09 NOTE — Progress Notes (Signed)
     SUBJECTIVE: no chest pain  Tele: sinus  BP (!) 128/58 (BP Location: Left Arm)   Pulse 73   Temp 97.2 F (36.2 C) (Oral)   Resp 18   Ht 5\' 9"  (1.753 m)   Wt 287 lb 9.6 oz (130.5 kg)   SpO2 100%   BMI 42.47 kg/m   Intake/Output Summary (Last 24 hours) at 09/09/16 [id number] Last data filed at 09/09/16 0805  Gross per 24 hour  Intake          2259.18 ml  Output             2775 ml  Net          -515.82 ml    PHYSICAL EXAM General: Well developed, well nourished, in no acute distress. Alert and oriented x 3.  Psych:  Good affect, responds appropriately Neck: No JVD. No masses noted.  Lungs: Clear bilaterally with no wheezes or rhonci noted.  Heart: RRR with no murmurs noted. Abdomen: Bowel sounds are present. Soft, non-tender.  Extremities: No lower extremity edema.   LABS: Basic Metabolic Panel:  Recent Labs  09/07/16 0041  NA 139  K 4.1  CL 103  CO2 26  GLUCOSE 186*  BUN 23*  CREATININE 1.38*  CALCIUM 9.5   CBC:  Recent Labs  09/07/16 0041 09/08/16 1003  WBC 6.4 6.4  NEUTROABS 3.7  --   HGB 12.7* 13.2  HCT 39.1 39.9  MCV 90.3 88.7  PLT 210 181   Cardiac Enzymes:  Recent Labs  09/07/16 0721 09/07/16 1444 09/07/16 2002  TROPONINI 0.35* 0.62* 0.52*   Current Meds: . alfuzosin  10 mg Oral Q breakfast  . aspirin  81 mg Oral Daily  . dicyclomine  10-20 mg Oral TID AC & HS  . insulin aspart  0-15 Units Subcutaneous Q4H  . omeprazole  40 mg Oral QAC breakfast  . pantoprazole  40 mg Oral q morning - 10a  . rosuvastatin  5 mg Oral Q M,W,F  . sodium chloride flush  3 mL Intravenous Q12H  . sotalol  120 mg Oral BID     ASSESSMENT AND PLAN:  1. CAD/NSTEMI: Pt has had multiple previous cardiac interventions with stent restenosis. Attempt at angioplasty of stent restenosis last month. Now readmitted with unstable angina, elevated troponin. Cardiac cath 09/08/16 with severe restenosis circumflex and OM stents, Diagonal stent and in the mid LAD  stent. He had been on Plavix and this is now on hold. Planning for CABG next week. He is on the schedule for September 15, 2017 allowing for Plavix washout. Will continue IV heparin, ASA, statin, IV NTG.  2. Ischemic cardiomyopathy: LVEF 35-40%. Will restart Lisinopril 20 mg daily. He is on sotalol. Will discuss changing this to a beta blocker given LV dysfunction.   Transfer to telemetry unit today   Lauree Chandler  10/19/20178:07 AM

## 2016-09-09 NOTE — Progress Notes (Signed)
[place] for heparin Indication: chest pain/ACS and atrial fibrillation   Assessment: 70 y.o. male with on xarelto PTA for afib, s/p cath 10/18, awaiting CABG next week (plavix washout), Holding xarelto for now, on IV heparin for anticoagulation. Recheck heparin level = 0.83, supratherapeutic on 1950 units/hr. But per patient, it was drawn from the same arm with heparin infusion. Likely falsely elevated. Previously therapeutic on current rate. No bleeding reported.   Goal of Therapy:  Heparin level 0.3-0.7 units/ml Monitor platelets by anticoagulation protocol: Yes   Plan:  Continue Heparin 1950 units/hr  F/u AM labs Monitor for s/sx bleeding   Allergies  Allergen Reactions  . Chocolate Hives, Shortness Of Breath and Swelling  . Statins Other (See Comments)    Mental changes, muscle aches  . Black Pepper [[place]] Hives  . Codeine Itching  . Oxytetracycline Other (See Comments)    Flushing in sunlight  . Tape Rash and Other (See Comments)    SKIN IS VERY SENSITIVE!!    Patient Measurements: Height: 5\' 9"  (175.3 cm) Weight: 288 lb 3.2 oz (130.7 kg) IBW/kg (Calculated) : 70.7 Heparin Dosing Weight: 100kg  Vital Signs: Temp: 97.5 F (36.4 C) (10/19 1713) Temp Source: Oral (10/19 1713) BP: 127/82 (10/19 1713) Pulse Rate: 78 (10/19 1713)  Labs:  Recent Labs  09/07/16 0041 09/07/16 0721  09/07/16 1444 09/07/16 2002 09/07/16 2300 09/08/16 1003 09/09/16 0700 09/09/16 1632  HGB 12.7*  --   --   --   --   --  13.2 13.0  --   HCT 39.1  --   --   --   --   --  39.9 39.0  --   PLT 210  --   --   --   --   --  181 200  --   APTT  --   --   --  29  --  39* 73*  --   --   LABPROT  --   --   --   --   --   --  14.7  --   --   INR  --   --   --   --   --   --  1.15  --   --   HEPARINUNFRC  --   --   < >  --   --   --  0.71* 0.53 0.84*  CREATININE 1.38*  --   --   --   --   --   --   --   --   TROPONINI  --  0.35*  --  0.62* 0.52*  --    --   --   --   < > = values in this interval not displayed.  Estimated Creatinine Clearance: 67.7 mL/min (by C-G formula based on SCr of 1.38 mg/dL (H)).  Maryanna Shape, PharmD, BCPS  Clinical Pharmacist  Pager: [phone]   10/19/20175:49 PM

## 2016-09-10 ENCOUNTER — Inpatient Hospital Stay (HOSPITAL_COMMUNITY): Payer: BLUE CROSS/BLUE SHIELD

## 2016-09-10 ENCOUNTER — Encounter (HOSPITAL_COMMUNITY): Payer: BLUE CROSS/BLUE SHIELD

## 2016-09-10 DIAGNOSIS — Z01810 Encounter for preprocedural cardiovascular examination: Secondary | ICD-10-CM

## 2016-09-10 DIAGNOSIS — I2510 Atherosclerotic heart disease of native coronary artery without angina pectoris: Secondary | ICD-10-CM

## 2016-09-10 LAB — CBC
HCT: 37.5 % — ABNORMAL LOW (ref 39.0–52.0)
HEMATOCRIT: 36 % — AB (ref 39.0–52.0)
Hemoglobin: 12 g/dL — ABNORMAL LOW (ref 13.0–17.0)
Hemoglobin: 12.4 g/dL — ABNORMAL LOW (ref 13.0–17.0)
MCH: 29.6 pg (ref 26.0–34.0)
MCH: 29.2 pg (ref 26.0–34.0)
MCHC: 33.3 g/dL (ref 30.0–36.0)
MCHC: 33.1 g/dL (ref 30.0–36.0)
MCV: 88.7 fL (ref 78.0–100.0)
MCV: 88.4 fL (ref 78.0–100.0)
PLATELETS: 180 10*3/uL (ref 150–400)
PLATELETS: 198 10*3/uL (ref 150–400)
RBC: 4.06 MIL/uL — ABNORMAL LOW (ref 4.22–5.81)
RBC: 4.24 MIL/uL (ref 4.22–5.81)
RDW: 14 % (ref 11.5–15.5)
RDW: 14.1 % (ref 11.5–15.5)
WBC: 6.6 10*3/uL (ref 4.0–10.5)
WBC: 6.3 10*3/uL (ref 4.0–10.5)

## 2016-09-10 LAB — BASIC METABOLIC PANEL
Anion gap: 7 (ref 5–15)
BUN: 13 mg/dL (ref 6–20)
CALCIUM: 8.6 mg/dL — AB (ref 8.9–10.3)
CO2: 22 mmol/L (ref 22–32)
CREATININE: 1.09 mg/dL (ref 0.61–1.24)
Chloride: 107 mmol/L (ref 101–111)
GFR calc Af Amer: >60 mL/min (ref >60)
GLUCOSE: 179 mg/dL — AB (ref 65–99)
Potassium: 4.1 mmol/L (ref 3.5–5.1)
Sodium: 136 mmol/L (ref 135–145)

## 2016-09-10 LAB — GLUCOSE, CAPILLARY
GLUCOSE-CAPILLARY: 266 mg/dL — AB (ref 65–99)
GLUCOSE-CAPILLARY: 250 mg/dL — AB (ref 65–99)
GLUCOSE-CAPILLARY: 202 mg/dL — AB (ref 65–99)
Glucose-Capillary: 174 mg/dL — ABNORMAL HIGH (ref 65–99)

## 2016-09-10 LAB — HEPARIN LEVEL (UNFRACTIONATED)
HEPARIN UNFRACTIONATED: 0.79 [IU]/mL — AB (ref 0.30–0.70)
Heparin Unfractionated: 0.58 IU/mL (ref 0.30–0.70)

## 2016-09-10 MED ORDER — INSULIN ASPART 100 UNIT/ML ~~LOC~~ SOLN
0.0000 [IU] | Freq: Three times a day (TID) | SUBCUTANEOUS | Status: DC
  Administered 2016-09-10: 5 [IU] via SUBCUTANEOUS
  Administered 2016-09-11 (×3): 3 [IU] via SUBCUTANEOUS
  Administered 2016-09-12 (×2): 5 [IU] via SUBCUTANEOUS
  Administered 2016-09-12 – 2016-09-14 (×5): 3 [IU] via SUBCUTANEOUS
  Administered 2016-09-14 (×2): 5 [IU] via SUBCUTANEOUS

## 2016-09-10 MED ORDER — MAGNESIUM HYDROXIDE 400 MG/5ML PO SUSP
30.0000 mL | Freq: Once | ORAL | Status: AC | PRN
  Administered 2016-09-10: 30 mL via ORAL
  Filled 2016-09-10: qty 30

## 2016-09-10 MED ORDER — INSULIN REGULAR HUMAN (CONC) 500 UNIT/ML ~~LOC~~ SOPN
25.0000 [IU] | PEN_INJECTOR | Freq: Two times a day (BID) | SUBCUTANEOUS | Status: DC
  Administered 2016-09-10 – 2016-09-14 (×9): 25 [IU] via SUBCUTANEOUS
  Filled 2016-09-10: qty 3

## 2016-09-10 MED ORDER — ISOSORBIDE MONONITRATE ER 30 MG PO TB24
60.0000 mg | ORAL_TABLET | Freq: Every day | ORAL | Status: DC
  Administered 2016-09-10 – 2016-09-14 (×5): 60 mg via ORAL
  Filled 2016-09-10 (×5): qty 1

## 2016-09-10 MED ORDER — CARVEDILOL 3.125 MG PO TABS
3.1250 mg | ORAL_TABLET | Freq: Two times a day (BID) | ORAL | Status: DC
  Administered 2016-09-10 – 2016-09-14 (×9): 3.125 mg via ORAL
  Filled 2016-09-10 (×9): qty 1

## 2016-09-10 NOTE — Progress Notes (Signed)
PROGRESS NOTE    Charles [name]  [id number] DOB: 1946-10-08 DOA: 09/07/2016 PCP: [name], MD   Brief Narrative:  Charles [name] is a 70 y.o. WM PMHx CAD native artery, NSTEMI on 9/6, this was due to in stent restenosis of distal LAD and L circumflex stents which were treated with balloon angioplasty only (no re-stenting). Other numerous stents were patent. Chronic Systolic and Diastolic CHF, PAF (paroxysmal atrial fibrillation), SSS (sick sinus syndrome Pacer present([employer] Jude Zephyr XL DR 5826, dual chamber),  LBBB (left bundle branch block), HTN, HLD, Diabetes Type 2 uncontrolled with Complications, OSA on CPAP, Nephrolithiasis  Over the past couple of days he has had recurrent anginal-like episodes (pain identical to prior cardiac angina) over past couple of mornings which all resolved.  Today however his symptoms did not resolve and so he presents to the ED with severe, squeezing quality, L chest pain with radiation to jaw.  SOB with pain.    Subjective: 10/20 A/O 4, NAD. States fleeting moment CP when ambulating immediately resolved. Currently negative CP negative SOB, sitting in chair comfortably. Just returned from vein mapping.     Assessment & Plan:   Principal Problem:   NSTEMI (non-ST elevated myocardial infarction) () Active Problems:   CAD (coronary artery disease), native coronary artery   Diabetes mellitus type 2 in obese ([place])   Unstable angina ([place])   Coronary artery disease involving native coronary artery of native heart   Uncontrolled type 2 diabetes mellitus with complication (HCC)   Chronic combined systolic and diastolic CHF (congestive heart failure) (HCC)   Sick sinus syndrome (HCC)   OSA on CPAP   Chronic pain syndrome   Cardiomyopathy, ischemic   Coronary artery disease involving native heart without angina pectoris  Unstable angina/NSTEMI -Patient had not been seen by cardiology at 12:30. Contacted Trish and cardiology had not been  contacted at admission. -Continue nitro drip -Continue heparin drip -Hold Plavix -Hold Xarelto -Patient was seen by cardiothoracic surgery and they recommend CABG. Tentatively planned for Wednesday, October 25  -Crestor 5 movement M/W/F secondary to intolerance  Chronic systolic and diastolic CHF -Strict I&O since admission - 1.2 L -Daily weight Filed Weights   09/09/16 0500 09/09/16 1713 09/10/16 0500  Weight: 130.5 kg (287 lb 9.6 oz) 130.7 kg (288 lb 3.2 oz) 130.5 kg (287 lb 9.6 oz)  -Sotalol 120 mg BID  Sick sinus syndrome -Patient currently paced  OSA -CPAP per respiratory  DM type II uncontrolled with complication -[id number] Hemoglobin A1c= 7.1   -U500 25 unit BID  -Moderate SSI  Chronic pain syndrome/torn rotator cuff/DDD/DJD -PO pain medication    DVT prophylaxis: Heparin drip Code Status: Full Family Communication: None Disposition Plan: Per cardiology   Consultants:  Dr. Burnell Blanks Cardiology Dr.Edward B Gerhardt T CTS    Procedures/Significant Events:  9/12 Echocardiogram:Left ventricle: -severe LVH.-LVEF=45% to 50%.  -(grade 1 diastolic dysfunction).    Cultures None  Antimicrobials:  None  Devices None   LINES / TUBES:  None    Continuous Infusions: . heparin 1,800 Units/hr (09/10/16 1447)     Objective: Vitals:   09/10/16 0500 09/10/16 0743 09/10/16 1143 09/10/16 1620  BP: 129/72 132/75 139/74 113/67  Pulse: 76 70 70 73  Resp: 18 18 16 16   Temp: 97.6 F (36.4 C) 97.8 F (36.6 C) 97.9 F (36.6 C) 97.8 F (36.6 C)  TempSrc: Oral Oral Oral Oral  SpO2: 97% 100% 97% 100%  Weight: 130.5 kg (287 lb 9.6  oz)     Height:        Intake/Output Summary (Last 24 hours) at 09/10/16 1739 Last data filed at 09/10/16 1620  Gross per 24 hour  Intake          1477.55 ml  Output             1300 ml  Net           177.55 ml   Filed Weights   09/09/16 0500 09/09/16 1713 09/10/16 0500  Weight: 130.5 kg (287 lb 9.6 oz) 130.7  kg (288 lb 3.2 oz) 130.5 kg (287 lb 9.6 oz)    Examination:  General: A/O 4, currently NAD, No acute respiratory distress Eyes: negative scleral hemorrhage, negative anisocoria, negative icterus ENT: Negative Runny nose, negative gingival bleeding, Neck:  Negative scars, masses, torticollis, lymphadenopathy, JVD Lungs: Clear to auscultation bilaterally without wheezes or crackles Cardiovascular: Regular rate and rhythm (paced) without murmur gallop or rub normal S1 and S2 Abdomen: negative abdominal pain, nondistended, positive soft, bowel sounds, no rebound, no ascites, no appreciable mass Extremities: No significant cyanosis, clubbing, or edema bilateral lower extremities Skin: Negative rashes, lesions, ulcers Psychiatric:  Negative depression, negative anxiety, negative fatigue, negative mania  Central nervous system:  Cranial nerves II through XII intact, tongue/uvula midline, all extremities muscle strength 5/5, sensation intact throughout, negative dysarthria, negative expressive aphasia, negative receptive aphasia.  .     Data Reviewed: Care during the described time interval was provided by me .  I have reviewed this patient's available data, including medical history, events of note, physical examination, and all test results as part of my evaluation. I have personally reviewed and interpreted all radiology studies.  CBC:  Recent Labs Lab 09/07/16 0041 09/08/16 1003 09/09/16 0700 09/10/16 0612 09/10/16 0926  WBC 6.4 6.4 6.5 6.6 6.3  NEUTROABS 3.7  --   --   --   --   HGB 12.7* 13.2 13.0 12.0* 12.4*  HCT 39.1 39.9 39.0 36.0* 37.5*  MCV 90.3 88.7 88.6 88.7 88.4  PLT 210 181 200 180 [id number]   Basic Metabolic Panel:  Recent Labs Lab 09/07/16 0041 09/10/16 0612  NA 139 136  K 4.1 4.1  CL 103 107  CO2 26 22  GLUCOSE 186* 179*  BUN 23* 13  CREATININE 1.38* 1.09  CALCIUM 9.5 8.6*   GFR: Estimated Creatinine Clearance: 85.6 mL/min (by C-G formula based on SCr of  1.09 mg/dL). Liver Function Tests:  Recent Labs Lab 09/07/16 0041  AST 15  ALT 17  ALKPHOS 56  BILITOT 0.6  PROT 7.6  ALBUMIN 4.0   No results for input(s): LIPASE, AMYLASE in the last 168 hours. No results for input(s): AMMONIA in the last 168 hours. Coagulation Profile:  Recent Labs Lab 09/08/16 1003  INR 1.15   Cardiac Enzymes:  Recent Labs Lab 09/07/16 0721 09/07/16 1444 09/07/16 2002  TROPONINI 0.35* 0.62* 0.52*   BNP (last 3 results)  Recent Labs  10/21/15 0932  PROBNP 364.0*   HbA1C: No results for input(s): HGBA1C in the last 72 hours. CBG:  Recent Labs Lab 09/09/16 1726 09/09/16 2118 09/10/16 0740 09/10/16 1142 09/10/16 1619  GLUCAP 202* 248* 174* 266* 250*   Lipid Profile: No results for input(s): CHOL, HDL, LDLCALC, TRIG, CHOLHDL, LDLDIRECT in the last 72 hours. Thyroid Function Tests: No results for input(s): TSH, T4TOTAL, FREET4, T3FREE, THYROIDAB in the last 72 hours. Anemia Panel: No results for input(s): VITAMINB12, FOLATE, FERRITIN, TIBC, IRON, RETICCTPCT in  the last 72 hours. Urine analysis:    Component Value Date/Time   COLORURINE AMBER (A) 02/05/2016 0054   APPEARANCEUR CLOUDY (A) 02/05/2016 0054   LABSPEC 1.021 02/05/2016 0054   PHURINE 5.0 02/05/2016 0054   GLUCOSEU 500 (A) 02/05/2016 0054   HGBUR NEGATIVE 02/05/2016 0054   BILIRUBINUR SMALL (A) 02/05/2016 0054   KETONESUR NEGATIVE 02/05/2016 0054   PROTEINUR NEGATIVE 02/05/2016 0054   UROBILINOGEN 0.2 06/15/2009 1125   NITRITE NEGATIVE 02/05/2016 0054   LEUKOCYTESUR NEGATIVE 02/05/2016 0054   Sepsis Labs: @LABRCNTIP (procalcitonin:4,lacticidven:4)  ) Recent Results (from the past 240 hour(s))  MRSA PCR Screening     Status: None   Collection Time: 09/07/16  7:22 AM  Result Value Ref Range Status   MRSA by PCR NEGATIVE NEGATIVE Final    Comment:        The GeneXpert MRSA Assay (FDA approved for NASAL specimens only), is one component of a comprehensive MRSA  colonization surveillance program. It is not intended to diagnose MRSA infection nor to guide or monitor treatment for MRSA infections.   Surgical PCR screen     Status: None   Collection Time: 09/08/16 10:13 PM  Result Value Ref Range Status   MRSA, PCR NEGATIVE NEGATIVE Final   Staphylococcus aureus NEGATIVE NEGATIVE Final    Comment:        The Xpert SA Assay (FDA approved for NASAL specimens in patients over 57 years of age), is one component of a comprehensive surveillance program.  Test performance has been validated by [facility] for patients greater than or equal to 51 year old. It is not intended to diagnose infection nor to guide or monitor treatment.          Radiology Studies: No results found.      Scheduled Meds: . alfuzosin  10 mg Oral Q breakfast  . aspirin  81 mg Oral Daily  . carvedilol  3.125 mg Oral BID WC  . dicyclomine  10-20 mg Oral TID AC & HS  . insulin aspart  0-15 Units Subcutaneous TID WC  . insulin regular human CONCENTRATED  25 Units Subcutaneous BID WC  . isosorbide mononitrate  60 mg Oral Daily  . lisinopril  20 mg Oral Daily  . omeprazole  40 mg Oral QAC breakfast  . pantoprazole  40 mg Oral q morning - 10a  . rosuvastatin  5 mg Oral Q M,W,F  . sodium chloride flush  3 mL Intravenous Q12H  . sotalol  120 mg Oral BID   Continuous Infusions: . heparin 1,800 Units/hr (09/10/16 1447)     LOS: 3 days    Time spent: 40 minutes    Mailani Degroote, Geraldo Docker, MD [facility] Pager [phone]   If 7PM-7AM, please contact night-coverage [email or web address] Password [facility] 09/10/2016, 5:39 PM

## 2016-09-10 NOTE — Plan of Care (Signed)
Problem: Phase I Progression Outcomes Goal: Pain controlled with appropriate interventions Outcome: Completed/Met Date Met: 09/10/16 Pt not having any chest pain on IV NTG. Pt taking Vicodin for rotator cuff issues.

## 2016-09-10 NOTE — Progress Notes (Signed)
[place] for heparin Indication: chest pain/ACS and atrial fibrillation   Assessment: 69 y.o. male with on xarelto PTA for afib, s/p cath 10/18, awaiting CABG next week (plavix washout), Holding xarelto for now, on IV heparin for anticoagulation.   Heparin level (0.58) is therapeutic on 1800 units/hr  Goal of Therapy:  Heparin level 0.3-0.7 units/ml Monitor platelets by anticoagulation protocol: Yes   Plan:  Continue Heparin infusion 1800 units/hr  Monitor for s/sx bleeding CBC, heparin levels daily while on heparin  Allergies  Allergen Reactions  . Chocolate Hives, Shortness Of Breath and Swelling  . Statins Other (See Comments)    Mental changes, muscle aches  . Black Pepper [[place]] Hives  . Codeine Itching  . Oxytetracycline Other (See Comments)    Flushing in sunlight  . Tape Rash and Other (See Comments)    SKIN IS VERY SENSITIVE!!    Patient Measurements: Height: 5\' 9"  (175.3 cm) Weight: 287 lb 9.6 oz (130.5 kg) IBW/kg (Calculated) : 70.7 Heparin Dosing Weight: 100kg  Vital Signs: Temp: 97.8 F (36.6 C) (10/20 1620) Temp Source: Oral (10/20 1620) BP: 113/67 (10/20 1620) Pulse Rate: 73 (10/20 1620)  Labs:  Recent Labs  09/07/16 2002 09/07/16 2300  09/08/16 1003 09/09/16 0700 09/09/16 1632 09/10/16 0612 09/10/16 0926 09/10/16 1824  HGB  --   --   < > 13.2 13.0  --  12.0* 12.4*  --   HCT  --   --   < > 39.9 39.0  --  36.0* 37.5*  --   PLT  --   --   < > 181 200  --  180 198  --   APTT  --  39*  --  73*  --   --   --   --   --   LABPROT  --   --   --  14.7  --   --   --   --   --   INR  --   --   --  1.15  --   --   --   --   --   HEPARINUNFRC  --   --   < > 0.71* 0.53 0.84*  --  0.79* 0.58  CREATININE  --   --   --   --   --   --  1.09  --   --   TROPONINI 0.52*  --   --   --   --   --   --   --   --   < > = values in this interval not displayed.  Estimated Creatinine Clearance: 85.6 mL/min (by C-G formula  based on SCr of 1.09 mg/dL).  Maryanna Shape, PharmD, BCPS  Clinical Pharmacist  Pager: [phone]    10/20/20177:11 PM

## 2016-09-10 NOTE — Plan of Care (Signed)
Problem: Phase I Progression Outcomes Goal: Vascular site scale level 0 - I Vascular Site Scale Level 0: No bruising/bleeding/hematoma Level I (Mild): Bruising/Ecchymosis, minimal bleeding/ooozing, palpable hematoma < 3 cm Level II (Moderate): Bleeding not affecting hemodynamic parameters, pseudoaneurysm, palpable hematoma > 3 cm Level III  (Severe) Bleeding which affects hemodynamic parameters or retroperitoneal hemorrhage   Outcome: Completed/Met Date Met: 09/10/16 Right radial site is soft to touch, and is a level 0.

## 2016-09-10 NOTE — Progress Notes (Addendum)
Inpatient Diabetes Program Recommendations  AACE/ADA: New Consensus Statement on Inpatient Glycemic Control (2015)  Target Ranges:  Prepandial:   less than 140 mg/dL      Peak postprandial:   less than 180 mg/dL (1-2 hours)      Critically ill patients:  140 - 180 mg/dL   Lab Results  Component Value Date   GLUCAP 266 (H) 09/10/2016   HGBA1C 7.1 (H) 08/03/2016    Review of Glycemic Control:  Results for Charles [name], Charles [name] (MRN [id number]) as of 09/10/2016 13:07  Ref. Range 09/09/2016 11:24 09/09/2016 17:26 09/09/2016 21:18 09/10/2016 07:40 09/10/2016 11:42  Glucose-Capillary Latest Ref Range: 65 - 99 mg/dL 215 (H) 202 (H) 248 (H) 174 (H) 266 (H)   Diabetes history: Type 2 diabetes Outpatient Diabetes medications: U500 50 units bid Current orders for Inpatient glycemic control:  Novolog moderate q 4 hours  Inpatient Diabetes Program Recommendations:    For CABG on 09/15/16.  Please consider restarting 1/2 of home U500-Regular which would be U500-Regular 25 units bid.   Call received from Dr. Sherral Hammers. Order received.  Thanks, Adah Perl, RN, BC-ADM Inpatient Diabetes Coordinator Pager [phone] (8a-5p)  16:30 discussed U500 regimen with patient.  He states that he has had several hypoglycemia events with U500 insulin- 50 units bid.  Told him that only 1/2 of home U500 would be resumed in the hospital.  Encouraged him to follow-up with MD regarding U500 dose and possible need for reduction.

## 2016-09-10 NOTE — Progress Notes (Signed)
Patient Name: Charles [name] Date of Encounter: 09/10/2016  Primary Cardiologist: Dr. Debbe Odea Problem List     Principal Problem:   NSTEMI (non-ST elevated myocardial infarction) [facility]) Active Problems:   CAD (coronary artery disease), native coronary artery   Diabetes mellitus type 2 in obese (HCC)   Unstable angina ([place])   Coronary artery disease involving native coronary artery of native heart   Uncontrolled type 2 diabetes mellitus with complication (HCC)   Chronic combined systolic and diastolic CHF (congestive heart failure) (HCC)   Sick sinus syndrome (HCC)   OSA on CPAP   Chronic pain syndrome   Cardiomyopathy, ischemic   Coronary artery disease involving native heart without angina pectoris     Subjective   Feels well, denies chest pain and SOB.   Inpatient Medications    Scheduled Meds: . alfuzosin  10 mg Oral Q breakfast  . aspirin  81 mg Oral Daily  . dicyclomine  10-20 mg Oral TID AC & HS  . insulin aspart  0-15 Units Subcutaneous Q4H  . lisinopril  20 mg Oral Daily  . omeprazole  40 mg Oral QAC breakfast  . pantoprazole  40 mg Oral q morning - 10a  . rosuvastatin  5 mg Oral Q M,W,F  . sodium chloride flush  3 mL Intravenous Q12H  . sotalol  120 mg Oral BID   Continuous Infusions: . heparin 1,950 Units/hr (09/10/16 0054)  . nitroGLYCERIN 10 mcg/min (09/10/16 0056)   PRN Meds: sodium chloride, acetaminophen, HYDROcodone-acetaminophen, nitroGLYCERIN, ondansetron (ZOFRAN) IV, sodium chloride flush, traMADol-acetaminophen   Vital Signs    Vitals:   09/09/16 2115 09/09/16 2350 09/10/16 0500 09/10/16 0743  BP: 124/76  129/72 132/75  Pulse: 70 73 76 70  Resp: 20 18 18 18   Temp: 97.4 F (36.3 C)  97.6 F (36.4 C) 97.8 F (36.6 C)  TempSrc: Oral  Oral Oral  SpO2: 98% 98% 97% 100%  Weight:   287 lb 9.6 oz (130.5 kg)   Height:        Intake/Output Summary (Last 24 hours) at 09/10/16 0938 Last data filed at 09/10/16 0600  Gross per 24  hour  Intake           1442.5 ml  Output             1050 ml  Net            392.5 ml   Filed Weights   09/09/16 0500 09/09/16 1713 09/10/16 0500  Weight: 287 lb 9.6 oz (130.5 kg) 288 lb 3.2 oz (130.7 kg) 287 lb 9.6 oz (130.5 kg)    Physical Exam   GEN: Well nourished, well developed, in no acute distress.  HEENT: Grossly normal.  Neck: Supple, no JVD, carotid bruits, or masses. Cardiac: RRR, no murmurs, rubs, or gallops. No clubbing, cyanosis, edema.  Radials/DP/PT 2+ and equal bilaterally.  Respiratory:  Respirations regular and unlabored, clear to auscultation bilaterally. GI: Soft, nontender, nondistended, BS + x 4. MS: no deformity or atrophy. Skin: warm and dry, no rash. Neuro:  Strength and sensation are intact. Psych: AAOx3.  Normal affect.  Labs    CBC  Recent Labs  09/09/16 0700 09/10/16 0612  WBC 6.5 6.6  HGB 13.0 12.0*  HCT 39.0 36.0*  MCV 88.6 88.7  PLT 200 99991111   Basic Metabolic Panel  Recent Labs  09/10/16 0612  NA 136  K 4.1  CL 107  CO2 22  GLUCOSE 179*  BUN  13  CREATININE 1.09  CALCIUM 8.6*   Cardiac Enzymes  Recent Labs  09/07/16 1444 09/07/16 2002  TROPONINI 0.62* 0.52*    Telemetry     A paced- Personally Reviewed  ECG    A paced- Personally Reviewed  Radiology    No results found.  Cardiac Studies   Intravascular Pressure Wire/FFR Study  Left Heart Cath and Coronary Angiography 09/08/16   1st Diag lesion, 75 %stenosed.  Prox Cx lesion, 0 %stenosed.  Ost RCA to Prox RCA lesion, 10 %stenosed.  Mid Cx lesion, 80 %stenosed.  Ost 2nd Mrg to 2nd Mrg lesion, 90 %stenosed.  Prox LAD lesion, 65 %stenosed.  Ost 1st Diag to 1st Diag lesion, 99 %stenosed.  RPDA-1 lesion, 70 %stenosed.  RPDA-2 lesion, 75 %stenosed.  Dist LAD lesion, 80 %stenosed.  Ost Ramus to Ramus lesion, 95 %stenosed.  Mid Cx to Dist Cx lesion, 90 %stenosed.  The left ventricular ejection fraction is 35-45% by visual estimate.  There is  moderate left ventricular systolic dysfunction.  LV end diastolic pressure is mildly elevated.    Rapid redevelopment of in-stent restenosis in each of the 3 sites treated in September.  FFR across the mid LAD stent is 0.81. Note that there is significant distal LAD disease beyond this lesion which is likely causing the FFR value to be falsely elevated. In other words, the LAD FFR in the absence of distal disease could easily be less than 0.8.  Significant disease is noted in the mid PDA, mid and distal circumflex (ISR), small first obtuse marginal, large second obtuse (ISR) marginal, second diagonal (ISR), with intermediate angiographic stenosis in the mid LAD stent (ISR). The distal LAD proximal to the apex contains segmental 80% narrowing.  Global left ventricular dysfunction with regional variation. EF 35-45%.   Patient Profile  Charles [name] a 70 y.o.WM PMHx CAD native artery, NSTEMI on 9/6, this was due to in stent restenosis of distal LAD and L circumflex stents which were treated with balloon angioplasty only (no re-stenting). Other numerous stents were patent. Chronic Systolic and Diastolic CHF, PAF (paroxysmal atrial fibrillation), SSS (sick sinus syndrome Pacer present(St. Jude Zephyr XL DR 5826, dual chamber), LBBB (left bundle branch block), HTN, HLD, Diabetes Type 2 uncontrolled with Complications, OSA on CPAP, Nephrolithiasis. For CABG next week after Plavix wash out.      Assessment & Plan    1. CAD/NSTEMI: Pt has had multiple previous cardiac interventions with stent restenosis. Attempt at angioplasty of stent restenosis last month. Now readmitted with unstable angina, elevated troponin. Cardiac cath 09/08/16 with severe restenosis circumflex and OM stents, Diagonal stent and in the mid LAD stent. He had been on Plavix and this is now on hold. Planning for CABG next week. He is on the schedule for September 15, 2017 allowing for Plavix washout. Will continue IV heparin,  ASA, statin, IV NTG.  2. Ischemic cardiomyopathy: LVEF 35-40%. On Lisinopril 20 mg daily, sotalol.    Signed, Arbutus Leas, NP  09/10/2016, 9:38 AM   I have personally seen and examined this patient with Jettie Booze, NP. I agree with the assessment and plan as outlined above. He is admitted with unstable angina. Now awaiting Plavix washout for CABG next week. Will add Coreg with LV systolic dysfunction. Continue heparin drip. Stop IV NTG. Add back Imdur 50 mg daily.   Lauree Chandler 09/10/2016 10:02 AM

## 2016-09-10 NOTE — Progress Notes (Addendum)
[place] for heparin Indication: chest pain/ACS and atrial fibrillation   Assessment: 70 y.o. male with on xarelto PTA for afib, s/p cath 10/18, awaiting CABG next week (plavix washout), Holding xarelto for now, on IV heparin for anticoagulation. Heparin level supratherapeutic last night at 0.84, however no change made as lab draw from same arm with heparin infusion per patient.  Today, heparin level remains supratherapeutic at 0.79 on 1950 units/hr. Hgb with slight decrease today from 13.0-12.4 however no s/s bleeding noted. PLTC stable, wnl.    Goal of Therapy:  Heparin level 0.3-0.7 units/ml Monitor platelets by anticoagulation protocol: Yes   Plan:  Decrease Heparin to 1800 units/hr  6hr heparin level at 1630 today Monitor for s/sx bleeding CBC, heparin levels daily while on heparin  Allergies  Allergen Reactions  . Chocolate Hives, Shortness Of Breath and Swelling  . Statins Other (See Comments)    Mental changes, muscle aches  . Black Pepper [[place]] Hives  . Codeine Itching  . Oxytetracycline Other (See Comments)    Flushing in sunlight  . Tape Rash and Other (See Comments)    SKIN IS VERY SENSITIVE!!    Patient Measurements: Height: 5\' 9"  (175.3 cm) Weight: 287 lb 9.6 oz (130.5 kg) IBW/kg (Calculated) : 70.7 Heparin Dosing Weight: 100kg  Vital Signs: Temp: 97.8 F (36.6 C) (10/20 0743) Temp Source: Oral (10/20 0743) BP: 132/75 (10/20 0743) Pulse Rate: 70 (10/20 0743)  Labs:  Recent Labs  09/07/16 1444 09/07/16 2002 09/07/16 2300  09/08/16 1003 09/09/16 0700 09/09/16 1632 09/10/16 0612 09/10/16 0926  HGB  --   --   --   < > 13.2 13.0  --  12.0* 12.4*  HCT  --   --   --   < > 39.9 39.0  --  36.0* 37.5*  PLT  --   --   --   < > 181 200  --  180 198  APTT 29  --  39*  --  73*  --   --   --   --   LABPROT  --   --   --   --  14.7  --   --   --   --   INR  --   --   --   --  1.15  --   --   --   --   HEPARINUNFRC   --   --   --   --  0.71* 0.53 0.84*  --  0.79*  CREATININE  --   --   --   --   --   --   --  1.09  --   TROPONINI 0.62* 0.52*  --   --   --   --   --   --   --   < > = values in this interval not displayed.  Estimated Creatinine Clearance: 85.6 mL/min (by C-G formula based on SCr of 1.09 mg/dL).  Carlean Jews, Pharm.D. PGY1 Pharmacy Resident 10/20/201710:41 AM Pager [phone]

## 2016-09-10 NOTE — Progress Notes (Signed)
Pre-op Cardiac Surgery  Carotid Findings:  Findings suggest 1-39% internal carotid artery stenosis bilaterally. Vertebral arteries are patent with antegrade flow.  Upper Extremity Right Left  Brachial Pressures 154-Triphasic 139-Triphasic  Radial Waveforms Triphasic Triphasic  Ulnar Waveforms Triphasic Triphasic  Palmar Arch (Allen's Test) Signal obliterates with radial compression, is unaffected with ulnar compression. Within normal limits    Lower  Extremity Right Left  Dorsalis Pedis 167-Monophasic 137-Monophasic  Anterior Tibial    Posterior Tibial 188-Triphasic 184-Triphasic  Ankle/Brachial Indices 1.22 1.19    Findings:   Bilateral ABIs are within normal limits at rest.   Right Lower Extremity Vein Map    Right Great Saphenous Vein   Segment Diameter Comment  1. Origin 6.27mm   2. High Thigh 4.2mm Branch  3. Mid Thigh 4.64mm   4. Low Thigh 3.55mm Multiple branches  5. At Knee 4.62mm   6. High Calf 3.23mm Branch  7. Low Calf 2.19mm Branch  8. Ankle 2.56mm Branch                Right Small Saphenous Vein Not visualized  Left Lower Extremity Vein Map    Left Great Saphenous Vein   Segment Diameter Comment  1. Origin 5.63mm   2. High Thigh 5.65mm   3. Mid Thigh 4.56mm   4. Low Thigh 4.63mm   5. At Knee 4.59mm   6. High Calf 3.62mm Branch  7. Low Calf 3.30mm Multiple branches  8. Ankle 3.76mm Multiple branches                Left Small Saphenous Vein Not visualized  09/10/2016 6:17 PM  Maudry Mayhew, BS, RVT, RDCS, RDMS

## 2016-09-10 NOTE — Plan of Care (Signed)
Problem: Phase I Progression Outcomes Goal: Voiding-avoid urinary catheter unless indicated Outcome: Completed/Met Date Met: 09/10/16 Pt voiding using urinal.

## 2016-09-11 LAB — CBC
HCT: 37.9 % — ABNORMAL LOW (ref 39.0–52.0)
Hemoglobin: 12.9 g/dL — ABNORMAL LOW (ref 13.0–17.0)
MCH: 30.5 pg (ref 26.0–34.0)
MCHC: 34 g/dL (ref 30.0–36.0)
MCV: 89.6 fL (ref 78.0–100.0)
Platelets: 175 K/uL (ref 150–400)
RBC: 4.23 MIL/uL (ref 4.22–5.81)
RDW: 14.3 % (ref 11.5–15.5)
WBC: 6.9 K/uL (ref 4.0–10.5)

## 2016-09-11 LAB — GLUCOSE, CAPILLARY
GLUCOSE-CAPILLARY: 191 mg/dL — AB (ref 65–99)
GLUCOSE-CAPILLARY: 181 mg/dL — AB (ref 65–99)
Glucose-Capillary: 186 mg/dL — ABNORMAL HIGH (ref 65–99)
Glucose-Capillary: 176 mg/dL — ABNORMAL HIGH (ref 65–99)

## 2016-09-11 LAB — HEPARIN LEVEL (UNFRACTIONATED): Heparin Unfractionated: 0.48 [IU]/mL (ref 0.30–0.70)

## 2016-09-11 MED ORDER — MAGNESIUM HYDROXIDE 400 MG/5ML PO SUSP
30.0000 mL | Freq: Every day | ORAL | Status: DC | PRN
  Administered 2016-09-11: 30 mL via ORAL
  Filled 2016-09-11: qty 30

## 2016-09-11 NOTE — Progress Notes (Signed)
PROGRESS NOTE    Charles [name]  [id number] DOB: Oct 08, 1946 DOA: 09/07/2016 PCP: [name], MD   Brief Narrative:  Charles [name] is a 70 y.o. WM PMHx CAD native artery, NSTEMI on 9/6, this was due to in stent restenosis of distal LAD and L circumflex stents which were treated with balloon angioplasty only (no re-stenting). Other numerous stents were patent. Chronic Systolic and Diastolic CHF, PAF (paroxysmal atrial fibrillation), SSS (sick sinus syndrome Pacer present([employer] Jude Zephyr XL DR 5826, dual chamber),  LBBB (left bundle branch block), HTN, HLD, Diabetes Type 2 uncontrolled with Complications, OSA on CPAP, Nephrolithiasis  Over the past couple of days he has had recurrent anginal-like episodes (pain identical to prior cardiac angina) over past couple of mornings which all resolved.  Today however his symptoms did not resolve and so he presents to the ED with severe, squeezing quality, L chest pain with radiation to jaw.  SOB with pain.    Subjective: 10/21 A/O 4, NAD. States fleeting moment CP when he woke up this morning. Nitroglycerin drip had been stopped last night by cardiology. Patient therefore did not ambulate today but sitting in chair comfortably negative CP/SOB since this A.m.         Assessment & Plan:   Principal Problem:   NSTEMI (non-ST elevated myocardial infarction) ([place]) Active Problems:   CAD (coronary artery disease), native coronary artery   Diabetes mellitus type 2 in obese (HCC)   Unstable angina ([place])   Coronary artery disease involving native coronary artery of native heart   Uncontrolled type 2 diabetes mellitus with complication (HCC)   Chronic combined systolic and diastolic CHF (congestive heart failure) (HCC)   Sick sinus syndrome (HCC)   OSA on CPAP   Chronic pain syndrome   Cardiomyopathy, ischemic   Coronary artery disease involving native heart without angina pectoris   CAD in native artery  Unstable  angina/NSTEMI -Patient had not been seen by cardiology at 12:30. Contacted Trish and cardiology had not been contacted at admission. -Continue nitro drip -Continue heparin drip -Hold Plavix -Hold Xarelto -Patient was seen by cardiothoracic surgery and they recommend CABG. Tentatively planned for Wednesday, October 25  -Crestor 5 movement M/W/F secondary to intolerance -Nitroglycerin drip discontinued by cardiology. I reminded patient that if he has any pain/discomfort/SOB IMMEDIATELY requests NTG tablet from nursing.  Chronic systolic and diastolic CHF -Strict I&O since admission - 871ml -Daily weight Filed Weights   09/09/16 1713 09/10/16 0500 09/11/16 0500  Weight: 130.7 kg (288 lb 3.2 oz) 130.5 kg (287 lb 9.6 oz) 130.7 kg (288 lb 1.6 oz)  -Sotalol 120 mg BID  Sick sinus syndrome -Patient currently paced  OSA -CPAP per respiratory  DM type II uncontrolled with complication -[id number] Hemoglobin A1c= 7.1   -U500 25 unit BID  -Moderate SSI  Chronic pain syndrome/torn rotator cuff/DDD/DJD -PO pain medication    DVT prophylaxis: Heparin drip Code Status: Full Family Communication: None Disposition Plan: Per cardiology   Consultants:  Dr. Burnell Blanks Cardiology Dr.Edward B Gerhardt T CTS    Procedures/Significant Events:  9/12 Echocardiogram:Left ventricle: -severe LVH.-LVEF=45% to 50%.  -(grade 1 diastolic dysfunction).    Cultures None  Antimicrobials:  None  Devices None   LINES / TUBES:  None    Continuous Infusions: . heparin 1,800 Units/hr (09/11/16 0706)     Objective: Vitals:   09/10/16 2027 09/10/16 2230 09/11/16 0500 09/11/16 1330  BP: 122/69  135/71 105/66  Pulse: 69 70 73 70  Resp: 16 18 18 20   Temp: 98.8 F (37.1 C)  97.7 F (36.5 C) 97.6 F (36.4 C)  TempSrc: Oral  Oral Oral  SpO2: 100% 100% 98% 97%  Weight:   130.7 kg (288 lb 1.6 oz)   Height:        Intake/Output Summary (Last 24 hours) at 09/11/16 1916 Last  data filed at 09/11/16 1850  Gross per 24 hour  Intake          1941.97 ml  Output             1600 ml  Net           341.97 ml   Filed Weights   09/09/16 1713 09/10/16 0500 09/11/16 0500  Weight: 130.7 kg (288 lb 3.2 oz) 130.5 kg (287 lb 9.6 oz) 130.7 kg (288 lb 1.6 oz)    Examination:  General: A/O 4, currently NAD, No acute respiratory distress Eyes: negative scleral hemorrhage, negative anisocoria, negative icterus ENT: Negative Runny nose, negative gingival bleeding, Neck:  Negative scars, masses, torticollis, lymphadenopathy, JVD Lungs: Clear to auscultation bilaterally without wheezes or crackles Cardiovascular: Regular rate and rhythm (paced) without murmur gallop or rub normal S1 and S2 Abdomen: negative abdominal pain, nondistended, positive soft, bowel sounds, no rebound, no ascites, no appreciable mass Extremities: No significant cyanosis, clubbing, or edema bilateral lower extremities Skin: Negative rashes, lesions, ulcers Psychiatric:  Negative depression, negative anxiety, negative fatigue, negative mania  Central nervous system:  Cranial nerves II through XII intact, tongue/uvula midline, all extremities muscle strength 5/5, sensation intact throughout, negative dysarthria, negative expressive aphasia, negative receptive aphasia.  .     Data Reviewed: Care during the described time interval was provided by me .  I have reviewed this patient's available data, including medical history, events of note, physical examination, and all test results as part of my evaluation. I have personally reviewed and interpreted all radiology studies.  CBC:  Recent Labs Lab 09/07/16 0041 09/08/16 1003 09/09/16 0700 09/10/16 0612 09/10/16 0926 09/11/16 1008  WBC 6.4 6.4 6.5 6.6 6.3 6.9  NEUTROABS 3.7  --   --   --   --   --   HGB 12.7* 13.2 13.0 12.0* 12.4* 12.9*  HCT 39.1 39.9 39.0 36.0* 37.5* 37.9*  MCV 90.3 88.7 88.6 88.7 88.4 89.6  PLT 210 181 200 180 198 0000000   Basic  Metabolic Panel:  Recent Labs Lab 09/07/16 0041 09/10/16 0612  NA 139 136  K 4.1 4.1  CL 103 107  CO2 26 22  GLUCOSE 186* 179*  BUN 23* 13  CREATININE 1.38* 1.09  CALCIUM 9.5 8.6*   GFR: Estimated Creatinine Clearance: 85.7 mL/min (by C-G formula based on SCr of 1.09 mg/dL). Liver Function Tests:  Recent Labs Lab 09/07/16 0041  AST 15  ALT 17  ALKPHOS 56  BILITOT 0.6  PROT 7.6  ALBUMIN 4.0   No results for input(s): LIPASE, AMYLASE in the last 168 hours. No results for input(s): AMMONIA in the last 168 hours. Coagulation Profile:  Recent Labs Lab 09/08/16 1003  INR 1.15   Cardiac Enzymes:  Recent Labs Lab 09/07/16 0721 09/07/16 1444 09/07/16 2002  TROPONINI 0.35* 0.62* 0.52*   BNP (last 3 results)  Recent Labs  10/21/15 0932  PROBNP 364.0*   HbA1C: No results for input(s): HGBA1C in the last 72 hours. CBG:  Recent Labs Lab 09/10/16 1619 09/10/16 2146 09/11/16 0743 09/11/16 1134 09/11/16 1709  GLUCAP 250* 202* 186* 191* 176*  Lipid Profile: No results for input(s): CHOL, HDL, LDLCALC, TRIG, CHOLHDL, LDLDIRECT in the last 72 hours. Thyroid Function Tests: No results for input(s): TSH, T4TOTAL, FREET4, T3FREE, THYROIDAB in the last 72 hours. Anemia Panel: No results for input(s): VITAMINB12, FOLATE, FERRITIN, TIBC, IRON, RETICCTPCT in the last 72 hours. Urine analysis:    Component Value Date/Time   COLORURINE AMBER (A) 02/05/2016 0054   APPEARANCEUR CLOUDY (A) 02/05/2016 0054   LABSPEC 1.021 02/05/2016 0054   PHURINE 5.0 02/05/2016 0054   GLUCOSEU 500 (A) 02/05/2016 0054   HGBUR NEGATIVE 02/05/2016 0054   BILIRUBINUR SMALL (A) 02/05/2016 0054   KETONESUR NEGATIVE 02/05/2016 0054   PROTEINUR NEGATIVE 02/05/2016 0054   UROBILINOGEN 0.2 06/15/2009 1125   NITRITE NEGATIVE 02/05/2016 0054   LEUKOCYTESUR NEGATIVE 02/05/2016 0054   Sepsis Labs: @LABRCNTIP (procalcitonin:4,lacticidven:4)  ) Recent Results (from the past 240 hour(s))   MRSA PCR Screening     Status: None   Collection Time: 09/07/16  7:22 AM  Result Value Ref Range Status   MRSA by PCR NEGATIVE NEGATIVE Final    Comment:        The GeneXpert MRSA Assay (FDA approved for NASAL specimens only), is one component of a comprehensive MRSA colonization surveillance program. It is not intended to diagnose MRSA infection nor to guide or monitor treatment for MRSA infections.   Surgical PCR screen     Status: None   Collection Time: 09/08/16 10:13 PM  Result Value Ref Range Status   MRSA, PCR NEGATIVE NEGATIVE Final   Staphylococcus aureus NEGATIVE NEGATIVE Final    Comment:        The Xpert SA Assay (FDA approved for NASAL specimens in patients over 27 years of age), is one component of a comprehensive surveillance program.  Test performance has been validated by [facility] for patients greater than or equal to 34 year old. It is not intended to diagnose infection nor to guide or monitor treatment.          Radiology Studies: No results found.      Scheduled Meds: . alfuzosin  10 mg Oral Q breakfast  . aspirin  81 mg Oral Daily  . carvedilol  3.125 mg Oral BID WC  . dicyclomine  10-20 mg Oral TID AC & HS  . insulin aspart  0-15 Units Subcutaneous TID WC  . insulin regular human CONCENTRATED  25 Units Subcutaneous BID WC  . isosorbide mononitrate  60 mg Oral Daily  . lisinopril  20 mg Oral Daily  . omeprazole  40 mg Oral QAC breakfast  . pantoprazole  40 mg Oral q morning - 10a  . rosuvastatin  5 mg Oral Q M,W,F  . sodium chloride flush  3 mL Intravenous Q12H  . sotalol  120 mg Oral BID   Continuous Infusions: . heparin 1,800 Units/hr (09/11/16 0706)     LOS: 4 days    Time spent: 40 minutes    Elynn Patteson, Geraldo Docker, MD [facility] Pager [phone]   If 7PM-7AM, please contact night-coverage [email or web address] Password [email or web address] 09/11/2016, 7:16 PM

## 2016-09-11 NOTE — Progress Notes (Signed)
Patient Name: Charles [name] Date of Encounter: 09/11/2016  Primary Cardiologist: Dr. Johnsie Cancel  70 yo with CAD, recent NSTEMI in Sept. 6.  admmitted 09/07/16 with increasing angina  Cath 10/18 shows multiple instent restenosis Has been referred for CABG on OCt 25   Hospital Problem List     Principal Problem:   NSTEMI (non-ST elevated myocardial infarction) [facility]) Active Problems:   CAD (coronary artery disease), native coronary artery   Diabetes mellitus type 2 in obese ([place])   Unstable angina ([place])   Coronary artery disease involving native coronary artery of native heart   Uncontrolled type 2 diabetes mellitus with complication (HCC)   Chronic combined systolic and diastolic CHF (congestive heart failure) (HCC)   Sick sinus syndrome (HCC)   OSA on CPAP   Chronic pain syndrome   Cardiomyopathy, ischemic   Coronary artery disease involving native heart without angina pectoris   CAD in native artery     Subjective   Feels well, denies chest pain and SOB.  On IV heparin   Inpatient Medications    Scheduled Meds: . alfuzosin  10 mg Oral Q breakfast  . aspirin  81 mg Oral Daily  . carvedilol  3.125 mg Oral BID WC  . dicyclomine  10-20 mg Oral TID AC & HS  . insulin aspart  0-15 Units Subcutaneous TID WC  . insulin regular human CONCENTRATED  25 Units Subcutaneous BID WC  . isosorbide mononitrate  60 mg Oral Daily  . lisinopril  20 mg Oral Daily  . omeprazole  40 mg Oral QAC breakfast  . pantoprazole  40 mg Oral q morning - 10a  . rosuvastatin  5 mg Oral Q M,W,F  . sodium chloride flush  3 mL Intravenous Q12H  . sotalol  120 mg Oral BID   Continuous Infusions: . heparin 1,800 Units/hr (09/11/16 0706)   PRN Meds: sodium chloride, acetaminophen, HYDROcodone-acetaminophen, nitroGLYCERIN, ondansetron (ZOFRAN) IV, sodium chloride flush, traMADol-acetaminophen   Vital Signs    Vitals:   09/10/16 1620 09/10/16 2027 09/10/16 2230 09/11/16 0500  BP: 113/67 122/69   135/71  Pulse: 73 69 70 73  Resp: 16 16 18 18   Temp: 97.8 F (36.6 C) 98.8 F (37.1 C)  97.7 F (36.5 C)  TempSrc: Oral Oral  Oral  SpO2: 100% 100% 100% 98%  Weight:    288 lb 1.6 oz (130.7 kg)  Height:        Intake/Output Summary (Last 24 hours) at 09/11/16 0811 Last data filed at 09/11/16 0600  Gross per 24 hour  Intake          1177.05 ml  Output              550 ml  Net           627.05 ml   Filed Weights   09/09/16 1713 09/10/16 0500 09/11/16 0500  Weight: 288 lb 3.2 oz (130.7 kg) 287 lb 9.6 oz (130.5 kg) 288 lb 1.6 oz (130.7 kg)    Physical Exam   GEN: obese male,  in no acute distress.  HEENT: Grossly normal.  Neck: Supple, no JVD, carotid bruits, or masses. Cardiac: RRR, no murmurs, rubs, or gallops. No clubbing, cyanosis, edema.  Cath site looks good  Respiratory:  Respirations regular and unlabored, clear to auscultation bilaterally. GI: Soft, nontender, nondistended, BS + x 4. MS: no deformity or atrophy. Skin: warm and dry, no rash. Neuro:  Strength and sensation are intact. Psych: AAOx3.  Normal  affect.  Labs    CBC  Recent Labs  09/10/16 0612 09/10/16 0926  WBC 6.6 6.3  HGB 12.0* 12.4*  HCT 36.0* 37.5*  MCV 88.7 88.4  PLT 180 [id number]   Basic Metabolic Panel  Recent Labs  09/10/16 0612  NA 136  K 4.1  CL 107  CO2 22  GLUCOSE 179*  BUN 13  CREATININE 1.09  CALCIUM 8.6*   Cardiac Enzymes No results for input(s): CKTOTAL, CKMB, CKMBINDEX, TROPONINI in the last 72 hours.  Telemetry     A paced- Personally Reviewed, rate is well controlled   ECG    A paced- Personally Reviewed  Radiology    No results found.  Cardiac Studies   Intravascular Pressure Wire/FFR Study  Left Heart Cath and Coronary Angiography 09/08/16   1st Diag lesion, 75 %stenosed.  Prox Cx lesion, 0 %stenosed.  Ost RCA to Prox RCA lesion, 10 %stenosed.  Mid Cx lesion, 80 %stenosed.  Ost 2nd Mrg to 2nd Mrg lesion, 90 %stenosed.  Prox LAD lesion, 65  %stenosed.  Ost 1st Diag to 1st Diag lesion, 99 %stenosed.  RPDA-1 lesion, 70 %stenosed.  RPDA-2 lesion, 75 %stenosed.  Dist LAD lesion, 80 %stenosed.  Ost Ramus to Ramus lesion, 95 %stenosed.  Mid Cx to Dist Cx lesion, 90 %stenosed.  The left ventricular ejection fraction is 35-45% by visual estimate.  There is moderate left ventricular systolic dysfunction.  LV end diastolic pressure is mildly elevated.    Rapid redevelopment of in-stent restenosis in each of the 3 sites treated in September.  FFR across the mid LAD stent is 0.81. Note that there is significant distal LAD disease beyond this lesion which is likely causing the FFR value to be falsely elevated. In other words, the LAD FFR in the absence of distal disease could easily be less than 0.8.  Significant disease is noted in the mid PDA, mid and distal circumflex (ISR), small first obtuse marginal, large second obtuse (ISR) marginal, second diagonal (ISR), with intermediate angiographic stenosis in the mid LAD stent (ISR). The distal LAD proximal to the apex contains segmental 80% narrowing.  Global left ventricular dysfunction with regional variation. EF 35-45%.   Patient Profile  Charles [name] a 70 y.o.WM PMHx CAD native artery, NSTEMI on 9/6, this was due to in stent restenosis of distal LAD and L circumflex stents which were treated with balloon angioplasty only (no re-stenting). Other numerous stents were patent. Chronic Systolic and Diastolic CHF, PAF (paroxysmal atrial fibrillation), SSS (sick sinus syndrome Pacer present(St. Jude Zephyr XL DR 5826, dual chamber), LBBB (left bundle branch block), HTN, HLD, Diabetes Type 2 uncontrolled with Complications, OSA on CPAP, Nephrolithiasis. For CABG next week after Plavix wash out.      Assessment & Plan    1. CAD/NSTEMI: Pt has had multiple stent procedures.  Has intense in-stent restenosis.   Cardiac cath 09/08/16 with severe restenosis circumflex and OM stents,  Diagonal stent and in the mid LAD stent. He had been on Plavix and this is now on hold. Planning for CABG next week. He is on the schedule for September 15, 2017 allowing for Plavix washout. Will continue IV heparin, ASA, statin, IV NTG.  2. Ischemic cardiomyopathy: LVEF 35-40%. On Lisinopril 20 mg daily, sotalol.     Mertie Moores, MD  09/11/2016 8:24 AM    [facility],  [place], [place]  [address] Pager [phone] Phone: [phone]; Fax: [phone]

## 2016-09-11 NOTE — Progress Notes (Signed)
0855 Went to see pt to walk. Pt stated he had CP after walking yesterday. Also has gotten a little tight in chest with going to bathroom. Will hold ambulation and follow up after surgery. Graylon Good RN BSN 09/11/2016 9:00 AM

## 2016-09-11 NOTE — Progress Notes (Signed)
Sikes for heparin Indication: chest pain/ACS and atrial fibrillation   Assessment: 70 y.o. male with on xarelto PTA for afib, s/p cath 10/18, awaiting CABG next week and is on schedule for 10/25 (plavix washout). Holding xarelto for now, on IV heparin for anticoagulation.   Heparin level (0.48) is therapeutic on 1800 units/hr. Hemoglobin and platelet counts are stable. No bleeding noted.   Goal of Therapy:  Heparin level 0.3-0.7 units/ml Monitor platelets by anticoagulation protocol: Yes   Plan:  Continue Heparin infusion 1800 units/hr  Monitor for s/sx bleeding CBC, heparin levels daily while on heparin  Allergies  Allergen Reactions  . Chocolate Hives, Shortness Of Breath and Swelling  . Statins Other (See Comments)    Mental changes, muscle aches  . Black Pepper [[place]] Hives  . Codeine Itching  . Oxytetracycline Other (See Comments)    Flushing in sunlight  . Tape Rash and Other (See Comments)    SKIN IS VERY SENSITIVE!!    Patient Measurements: Height: 5\' 9"  (175.3 cm) Weight: 288 lb 1.6 oz (130.7 kg) IBW/kg (Calculated) : 70.7 Heparin Dosing Weight: 100kg  Vital Signs: Temp: 97.7 F (36.5 C) (10/21 0500) Temp Source: Oral (10/21 0500) BP: 135/71 (10/21 0500) Pulse Rate: 73 (10/21 0500)  Labs:  Recent Labs  09/10/16 0612 09/10/16 0926 09/10/16 1824 09/11/16 1008  HGB 12.0* 12.4*  --  12.9*  HCT 36.0* 37.5*  --  37.9*  PLT 180 198  --  175  HEPARINUNFRC  --  0.79* 0.58 0.48  CREATININE 1.09  --   --   --     Estimated Creatinine Clearance: 85.7 mL/min (by C-G formula based on SCr of 1.09 mg/dL).  Demetrius Charity, PharmD Acute Care Pharmacy Resident  Pager: [phone] 09/11/2016

## 2016-09-12 LAB — VAS US DOPPLER PRE CABG
LEFT VERTEBRAL DIAS: 9 cm/s
Left CCA dist dias: 17 cm/s
Left CCA dist sys: 62 cm/s
Left CCA prox dias: 13 cm/s
Left CCA prox sys: 66 cm/s
Left ICA dist dias: -25 cm/s
Left ICA dist sys: -57 cm/s
Left ICA prox dias: -17 cm/s
Left ICA prox sys: -46 cm/s
RIGHT ECA DIAS: -11 cm/s
RIGHT VERTEBRAL DIAS: 7 cm/s
Right CCA prox dias: 11 cm/s
Right CCA prox sys: 57 cm/s
Right cca dist sys: 40 cm/s

## 2016-09-12 LAB — CBC
HCT: 36.9 % — ABNORMAL LOW (ref 39.0–52.0)
Hemoglobin: 12.1 g/dL — ABNORMAL LOW (ref 13.0–17.0)
MCH: 29.2 pg (ref 26.0–34.0)
MCHC: 32.8 g/dL (ref 30.0–36.0)
MCV: 89.1 fL (ref 78.0–100.0)
PLATELETS: 189 10*3/uL (ref 150–400)
RBC: 4.14 MIL/uL — ABNORMAL LOW (ref 4.22–5.81)
RDW: 14.3 % (ref 11.5–15.5)
WBC: 6.8 10*3/uL (ref 4.0–10.5)

## 2016-09-12 LAB — BASIC METABOLIC PANEL
Anion gap: 8 (ref 5–15)
BUN: 15 mg/dL (ref 6–20)
CALCIUM: 9.1 mg/dL (ref 8.9–10.3)
CO2: 25 mmol/L (ref 22–32)
Chloride: 104 mmol/L (ref 101–111)
Creatinine, Ser: 1.24 mg/dL (ref 0.61–1.24)
GFR calc Af Amer: >60 mL/min (ref >60)
GFR, EST NON AFRICAN AMERICAN: 58 mL/min — AB (ref >60)
GLUCOSE: 153 mg/dL — AB (ref 65–99)
Potassium: 3.9 mmol/L (ref 3.5–5.1)
Sodium: 137 mmol/L (ref 135–145)

## 2016-09-12 LAB — HEPARIN LEVEL (UNFRACTIONATED): Heparin Unfractionated: 0.63 IU/mL (ref 0.30–0.70)

## 2016-09-12 LAB — MAGNESIUM: Magnesium: 2 mg/dL (ref 1.7–2.4)

## 2016-09-12 LAB — GLUCOSE, CAPILLARY
GLUCOSE-CAPILLARY: 229 mg/dL — AB (ref 65–99)
GLUCOSE-CAPILLARY: 173 mg/dL — AB (ref 65–99)
Glucose-Capillary: 158 mg/dL — ABNORMAL HIGH (ref 65–99)
Glucose-Capillary: 215 mg/dL — ABNORMAL HIGH (ref 65–99)

## 2016-09-12 MED ORDER — SORBITOL 70 % SOLN
30.0000 mL | Freq: Once | Status: AC
  Administered 2016-09-12: 30 mL via ORAL
  Filled 2016-09-12: qty 30

## 2016-09-12 NOTE — Progress Notes (Signed)
Progress Note    Charles [name]  [id number] DOB: [birth year]  DOA: 09/07/2016 PCP: [name], MD    Brief Narrative:   Chief complaint: F/U chest pain/ACS and atrial fibrillation   [name] is an 70 y.o. male with a PMHx of CAD of native artery status post multiple stents, NSTEMI on 07/28/16, due to in stent restenosis of distal LAD and L circumflex stents which were treated with balloon angioplasty only (no re-stenting). (Other numerous stents were patent), chronic Systolic and Diastolic CHF, PAF (paroxysmal atrial fibrillation), SSS (sick sinus syndrome Pacer present (St. Jude Zephyr XL DR 5826, dual chamber), LBBB (left bundle branch block), HTN, HLD, Diabetes Type 2 uncontrolled with Complications, OSA on CPAP, and Nephrolithiasis  who was admitted 09/07/16 for evaluation of chest pain.  Assessment/Plan:   Principal Problem:   NSTEMI (non-ST elevated myocardial infarction) ([place]) in a patient with known coronary artery disease presenting with unstable angina  Records reviewed. Troponins elevated. Pt has had multiple stent procedures and now has in-stent restenosis.   Cardiac cath 09/08/16 showed severe restenosis circumflex and OM stents, diagonal stent and in the mid LAD stent. He had been on Plavix and this is now on hold. CABG planned for September 15, 2017 allowing for Plavix washout. Continue IV heparin, Imdur, ASA, statin, IV NTG. LDL 53 on 08/17/16.  Active Problems:   Constipation Give Sorbitol.    Uncontrolled type 2 diabetes mellitus with complication (HCC) Hemoglobin A1c done 08/03/16:7.1%. Currently being managed with you 500, 25 units twice a day moderate scale SSI. CBGs 176-202.    Sick sinus syndrome ([place]) Has a pacemaker in situ.    OSA on CPAP Continue CPAP per respiratory.    Chronic pain syndrome Continue current pain management regimen.    Cardiomyopathy, ischemic / Chronic combined systolic and diastolic CHF (congestive heart failure)  (HCC) LVEF 35-40%. On Lisinopril 20 mg daily, sotalol. Weight and I/O balance stable.   Family Communication/Anticipated D/C date and plan/Code Status   DVT prophylaxis: Heparin ordered. Code Status: Full Code.  Family Communication: No family at the bedside. Disposition Plan: CABG planned for 09/15/16.   Medical Consultants:    Cardiology  CVTS   Procedures:    Cardiac cath 09/08/16: Rapid redevelopment of in-stent restenosis in each of the 3 sites treated in September.  PFTs 09/09/16  Carotid dopplers 09/10/16  LE dopplers with saphenous vien mapping 09/10/16  Anti-Infectives:    None  Subjective:   The patient reports that he feels bloated and had a stomach ache this morning. Last BM was 2 days ago. MOM x 2 has not been effective. Review of symptoms is positive for constipation and negative for chest pain and shortness of breath.  Objective:    Vitals:   09/11/16 0500 09/11/16 1330 09/11/16 2104 09/12/16 0605  BP: 135/71 105/66 136/78 135/82  Pulse: 73 70 74 70  Resp: 18 20 18 18   Temp: 97.7 F (36.5 C) 97.6 F (36.4 C) 97.7 F (36.5 C) 97.6 F (36.4 C)  TempSrc: Oral Oral Oral Oral  SpO2: 98% 97% 100% 97%  Weight: 130.7 kg (288 lb 1.6 oz)   130.3 kg (287 lb 3.2 oz)  Height:        Intake/Output Summary (Last 24 hours) at 09/12/16 0728 Last data filed at 09/12/16 0649  Gross per 24 hour  Intake          1652.17 ml  Output  2200 ml  Net          -547.83 ml   Filed Weights   09/10/16 0500 09/11/16 0500 09/12/16 0605  Weight: 130.5 kg (287 lb 9.6 oz) 130.7 kg (288 lb 1.6 oz) 130.3 kg (287 lb 3.2 oz)    Exam: General exam: Appears calm and comfortable.  Respiratory system: Clear to auscultation. Respiratory effort normal. Cardiovascular system: S1 & S2 heard, RRR. No JVD,  rubs, gallops or clicks. No murmurs. Gastrointestinal system: Abdomen is softly distended, soft and nontender. No organomegaly or masses felt. Normal bowel  sounds heard. Central nervous system: Alert and oriented. No focal neurological deficits. Extremities: No clubbing,  or cyanosis. 2+ edema. Skin: No rashes, lesions or ulcers. Psychiatry: Judgement and insight appear normal. Mood & affect appropriate.   Data Reviewed:   I have personally reviewed following labs and imaging studies:  Labs: Basic Metabolic Panel:  Recent Labs Lab 09/07/16 0041 09/10/16 0612 09/12/16 0426  NA 139 136 137  K 4.1 4.1 3.9  CL 103 107 104  CO2 26 22 25   GLUCOSE 186* 179* 153*  BUN 23* 13 15  CREATININE 1.38* 1.09 1.24  CALCIUM 9.5 8.6* 9.1  MG  --   --  2.0   GFR Estimated Creatinine Clearance: 75.2 mL/min (by C-G formula based on SCr of 1.24 mg/dL). Liver Function Tests:  Recent Labs Lab 09/07/16 0041  AST 15  ALT 17  ALKPHOS 56  BILITOT 0.6  PROT 7.6  ALBUMIN 4.0   Coagulation profile  Recent Labs Lab 09/08/16 1003  INR 1.15    CBC:  Recent Labs Lab 09/07/16 0041  09/09/16 0700 09/10/16 0612 09/10/16 0926 09/11/16 1008 09/12/16 0426  WBC 6.4  < > 6.5 6.6 6.3 6.9 6.8  NEUTROABS 3.7  --   --   --   --   --   --   HGB 12.7*  < > 13.0 12.0* 12.4* 12.9* 12.1*  HCT 39.1  < > 39.0 36.0* 37.5* 37.9* 36.9*  MCV 90.3  < > 88.6 88.7 88.4 89.6 89.1  PLT 210  < > 200 180 198 175 189  < > = values in this interval not displayed. Cardiac Enzymes:  Recent Labs Lab 09/07/16 0721 09/07/16 1444 09/07/16 2002  TROPONINI 0.35* 0.62* 0.52*   BNP (last 3 results)  Recent Labs  10/21/15 0932  PROBNP 364.0*   CBG:  Recent Labs Lab 09/10/16 2146 09/11/16 0743 09/11/16 1134 09/11/16 1709 09/11/16 2101  GLUCAP 202* 186* 191* 176* 181*    Microbiology Recent Results (from the past 240 hour(s))  MRSA PCR Screening     Status: None   Collection Time: 09/07/16  7:22 AM  Result Value Ref Range Status   MRSA by PCR NEGATIVE NEGATIVE Final    Comment:        The GeneXpert MRSA Assay (FDA approved for NASAL  specimens only), is one component of a comprehensive MRSA colonization surveillance program. It is not intended to diagnose MRSA infection nor to guide or monitor treatment for MRSA infections.   Surgical PCR screen     Status: None   Collection Time: 09/08/16 10:13 PM  Result Value Ref Range Status   MRSA, PCR NEGATIVE NEGATIVE Final   Staphylococcus aureus NEGATIVE NEGATIVE Final    Comment:        The Xpert SA Assay (FDA approved for NASAL specimens in patients over 63 years of age), is one component of a comprehensive surveillance program.  Test performance has been validated by [facility] for patients greater than or equal to 16 year old. It is not intended to diagnose infection nor to guide or monitor treatment.     Radiology: No results found.  Medications:   . alfuzosin  10 mg Oral Q breakfast  . aspirin  81 mg Oral Daily  . carvedilol  3.125 mg Oral BID WC  . dicyclomine  10-20 mg Oral TID AC & HS  . insulin aspart  0-15 Units Subcutaneous TID WC  . insulin regular human CONCENTRATED  25 Units Subcutaneous BID WC  . isosorbide mononitrate  60 mg Oral Daily  . lisinopril  20 mg Oral Daily  . omeprazole  40 mg Oral QAC breakfast  . pantoprazole  40 mg Oral q morning - 10a  . rosuvastatin  5 mg Oral Q M,W,F  . sodium chloride flush  3 mL Intravenous Q12H  . sotalol  120 mg Oral BID   Continuous Infusions: . heparin 1,800 Units/hr (09/11/16 0706)    Medical decision making is of high complexity and this patient is at high risk of deterioration, therefore this is a level 3 visit.     LOS: 5 days   [facility] Pager [phone]. If unable to reach me by pager, please call my cell phone at [phone].  *Please refer to [email or web address], password [email or web address] to get updated schedule on who will round on this patient, as hospitalists switch teams weekly. If 7PM-7AM, please contact night-coverage at [email or web address], password [email or web address] for any overnight  needs.  09/12/2016, 7:28 AM

## 2016-09-12 NOTE — Progress Notes (Signed)
[place] for heparin Indication: chest pain/ACS and atrial fibrillation   Assessment: 70 y.o. male with on xarelto PTA for afib, s/p cath 10/18, awaiting CABG next week and is on schedule for 10/25 (plavix washout). Holding xarelto for now, on IV heparin for anticoagulation.   Heparin level (0.63) is therapeutic on 1800 units/hr. Hemoglobin and platelet counts are stable. No bleeding noted.   Goal of Therapy:  Heparin level 0.3-0.7 units/ml Monitor platelets by anticoagulation protocol: Yes   Plan:  Continue Heparin infusion 1800 units/hr  Monitor for s/sx bleeding CBC, heparin levels daily while on heparin  Allergies  Allergen Reactions  . Chocolate Hives, Shortness Of Breath and Swelling  . Statins Other (See Comments)    Mental changes, muscle aches  . Black Pepper [[place]] Hives  . Codeine Itching  . Oxytetracycline Other (See Comments)    Flushing in sunlight  . Tape Rash and Other (See Comments)    SKIN IS VERY SENSITIVE!!    Patient Measurements: Height: 5\' 9"  (175.3 cm) Weight: 287 lb 3.2 oz (130.3 kg) IBW/kg (Calculated) : 70.7 Heparin Dosing Weight: 100kg  Vital Signs: Temp: 97.6 F (36.4 C) (10/22 0605) Temp Source: Oral (10/22 0605) BP: 135/82 (10/22 0605) Pulse Rate: 70 (10/22 0605)  Labs:  Recent Labs  09/10/16 0612 09/10/16 0926 09/10/16 1824 09/11/16 1008 09/12/16 0426  HGB 12.0* 12.4*  --  12.9* 12.1*  HCT 36.0* 37.5*  --  37.9* 36.9*  PLT 180 198  --  175 189  HEPARINUNFRC  --  0.79* 0.58 0.48 0.63  CREATININE 1.09  --   --   --  1.24    Estimated Creatinine Clearance: 75.2 mL/min (by C-G formula based on SCr of 1.24 mg/dL).  Demetrius Charity, PharmD Acute Care Pharmacy Resident  Pager: [phone] 09/12/2016

## 2016-09-12 NOTE — Progress Notes (Signed)
Patient Name: Charles [name] Date of Encounter: 09/12/2016  Primary Cardiologist: Dr. Johnsie Cancel  70 yo with CAD, recent NSTEMI in Sept. 6.  admmitted 09/07/16 with increasing angina  Cath 10/18 shows multiple instent restenosis Has been referred for CABG on OCt 25   Hospital Problem List     Principal Problem:   NSTEMI (non-ST elevated myocardial infarction) [facility]) Active Problems:   Diabetes mellitus type 2 in obese ([place])   Unstable angina ([employer])   Coronary artery disease involving native coronary artery of native heart   Uncontrolled type 2 diabetes mellitus with complication (HCC)   Chronic combined systolic and diastolic CHF (congestive heart failure) (HCC)   Sick sinus syndrome (HCC)   OSA on CPAP   Chronic pain syndrome   Cardiomyopathy, ischemic     Subjective   Feels well, On IV heparin , denies chest pain and SOB.   Has not had a bowel movement in several days, a bit concerned about that   Inpatient Medications    Scheduled Meds: . alfuzosin  10 mg Oral Q breakfast  . aspirin  81 mg Oral Daily  . carvedilol  3.125 mg Oral BID WC  . dicyclomine  10-20 mg Oral TID AC & HS  . insulin aspart  0-15 Units Subcutaneous TID WC  . insulin regular human CONCENTRATED  25 Units Subcutaneous BID WC  . isosorbide mononitrate  60 mg Oral Daily  . lisinopril  20 mg Oral Daily  . omeprazole  40 mg Oral QAC breakfast  . pantoprazole  40 mg Oral q morning - 10a  . rosuvastatin  5 mg Oral Q M,W,F  . sodium chloride flush  3 mL Intravenous Q12H  . sotalol  120 mg Oral BID   Continuous Infusions: . heparin 1,800 Units/hr (09/11/16 0706)   PRN Meds: sodium chloride, acetaminophen, HYDROcodone-acetaminophen, magnesium hydroxide, nitroGLYCERIN, ondansetron (ZOFRAN) IV, sodium chloride flush, traMADol-acetaminophen   Vital Signs    Vitals:   09/11/16 0500 09/11/16 1330 09/11/16 2104 09/12/16 0605  BP: 135/71 105/66 136/78 135/82  Pulse: 73 70 74 70  Resp: 18 20 18 18     Temp: 97.7 F (36.5 C) 97.6 F (36.4 C) 97.7 F (36.5 C) 97.6 F (36.4 C)  TempSrc: Oral Oral Oral Oral  SpO2: 98% 97% 100% 97%  Weight: 288 lb 1.6 oz (130.7 kg)   287 lb 3.2 oz (130.3 kg)  Height:        Intake/Output Summary (Last 24 hours) at 09/12/16 0905 Last data filed at 09/12/16 0855  Gross per 24 hour  Intake          1892.17 ml  Output             2200 ml  Net          -307.83 ml   Filed Weights   09/10/16 0500 09/11/16 0500 09/12/16 0605  Weight: 287 lb 9.6 oz (130.5 kg) 288 lb 1.6 oz (130.7 kg) 287 lb 3.2 oz (130.3 kg)    Physical Exam   GEN: obese male,  in no acute distress.  HEENT: Grossly normal.  Neck: Supple, no JVD, carotid bruits, or masses. Cardiac: RRR, no murmurs, rubs, or gallops. No clubbing, cyanosis, edema.  Cath site looks good  Respiratory:  Respirations regular and unlabored, clear to auscultation bilaterally. GI: Soft, nontender, nondistended, BS + x 4. MS: no deformity or atrophy. Skin: warm and dry, no rash. Neuro:  Strength and sensation are intact. Psych: AAOx3.  Normal affect.  Labs    CBC  Recent Labs  09/11/16 1008 09/12/16 0426  WBC 6.9 6.8  HGB 12.9* 12.1*  HCT 37.9* 36.9*  MCV 89.6 89.1  PLT 175 [id number]   Basic Metabolic Panel  Recent Labs  09/10/16 0612 09/12/16 0426  NA 136 137  K 4.1 3.9  CL 107 104  CO2 22 25  GLUCOSE 179* 153*  BUN 13 15  CREATININE 1.09 1.24  CALCIUM 8.6* 9.1  MG  --  2.0   Cardiac Enzymes No results for input(s): CKTOTAL, CKMB, CKMBINDEX, TROPONINI in the last 72 hours.  Telemetry     A paced- Personally Reviewed, rate is well controlled   ECG    A paced- Personally Reviewed  Radiology    No results found.  Cardiac Studies   Intravascular Pressure Wire/FFR Study  Left Heart Cath and Coronary Angiography 09/08/16   1st Diag lesion, 75 %stenosed.  Prox Cx lesion, 0 %stenosed.  Ost RCA to Prox RCA lesion, 10 %stenosed.  Mid Cx lesion, 80 %stenosed.  Ost 2nd Mrg to  2nd Mrg lesion, 90 %stenosed.  Prox LAD lesion, 65 %stenosed.  Ost 1st Diag to 1st Diag lesion, 99 %stenosed.  RPDA-1 lesion, 70 %stenosed.  RPDA-2 lesion, 75 %stenosed.  Dist LAD lesion, 80 %stenosed.  Ost Ramus to Ramus lesion, 95 %stenosed.  Mid Cx to Dist Cx lesion, 90 %stenosed.  The left ventricular ejection fraction is 35-45% by visual estimate.  There is moderate left ventricular systolic dysfunction.  LV end diastolic pressure is mildly elevated.    Rapid redevelopment of in-stent restenosis in each of the 3 sites treated in September.  FFR across the mid LAD stent is 0.81. Note that there is significant distal LAD disease beyond this lesion which is likely causing the FFR value to be falsely elevated. In other words, the LAD FFR in the absence of distal disease could easily be less than 0.8.  Significant disease is noted in the mid PDA, mid and distal circumflex (ISR), small first obtuse marginal, large second obtuse (ISR) marginal, second diagonal (ISR), with intermediate angiographic stenosis in the mid LAD stent (ISR). The distal LAD proximal to the apex contains segmental 80% narrowing.  Global left ventricular dysfunction with regional variation. EF 35-45%.   Patient Profile  Charles [name] a 70 y.o.WM PMHx CAD native artery, NSTEMI on 9/6, this was due to in stent restenosis of distal LAD and L circumflex stents which were treated with balloon angioplasty only (no re-stenting). Other numerous stents were patent. Chronic Systolic and Diastolic CHF, PAF (paroxysmal atrial fibrillation), SSS (sick sinus syndrome Pacer present(St. Jude Zephyr XL DR 5826, dual chamber), LBBB (left bundle branch block), HTN, HLD, Diabetes Type 2 uncontrolled with Complications, OSA on CPAP, Nephrolithiasis. For CABG next week after Plavix wash out.      Assessment & Plan    1. CAD/NSTEMI: Pt has had multiple stent procedures.  Has intense in-stent restenosis.   Cardiac cath  09/08/16 with severe restenosis circumflex and OM stents, Diagonal stent and in the mid LAD stent. He had been on Plavix and this is now on hold. Planning for CABG next week. He is on the schedule for September 15, 2017 allowing for Plavix washout. Will continue IV heparin, ASA, statin, IV NTG. Encouraged Him to be up and ambulating. He has some concerns about the anesthesia. He woke up during the procedure in the past and remembers having the ET tube in place. Members how uncomfortable it was was  to make sure that he does not wake up while still intubated.  2. Ischemic cardiomyopathy: LVEF 35-40%. On Lisinopril 20 mg daily, sotalol.   3. Constipation: The patient has received milk of magnesia. I encouraged him to get up and walk around a bit.   Mertie Moores, MD  09/12/2016 9:05 AM    [place],  [place], [place]  [address] Pager [phone] Phone: [phone]; Fax: [phone]

## 2016-09-13 DIAGNOSIS — I25110 Atherosclerotic heart disease of native coronary artery with unstable angina pectoris: Secondary | ICD-10-CM

## 2016-09-13 LAB — CBC
HCT: 37.2 % — ABNORMAL LOW (ref 39.0–52.0)
Hemoglobin: 12.1 g/dL — ABNORMAL LOW (ref 13.0–17.0)
MCH: 29.2 pg (ref 26.0–34.0)
MCHC: 32.5 g/dL (ref 30.0–36.0)
MCV: 89.9 fL (ref 78.0–100.0)
PLATELETS: 178 10*3/uL (ref 150–400)
RBC: 4.14 MIL/uL — AB (ref 4.22–5.81)
RDW: 14.4 % (ref 11.5–15.5)
WBC: 7.2 10*3/uL (ref 4.0–10.5)

## 2016-09-13 LAB — HEPARIN LEVEL (UNFRACTIONATED): HEPARIN UNFRACTIONATED: 0.55 [IU]/mL (ref 0.30–0.70)

## 2016-09-13 LAB — GLUCOSE, CAPILLARY
GLUCOSE-CAPILLARY: 180 mg/dL — AB (ref 65–99)
GLUCOSE-CAPILLARY: 181 mg/dL — AB (ref 65–99)
GLUCOSE-CAPILLARY: 169 mg/dL — AB (ref 65–99)
Glucose-Capillary: 193 mg/dL — ABNORMAL HIGH (ref 65–99)

## 2016-09-13 LAB — PLATELET INHIBITION P2Y12: Platelet Function  P2Y12: 264 [PRU] (ref 194–418)

## 2016-09-13 MED ORDER — SORBITOL 70 % SOLN
30.0000 mL | Freq: Every day | Status: DC | PRN
Start: 2016-09-13 — End: 2016-09-15
  Filled 2016-09-13: qty 30

## 2016-09-13 MED ORDER — HYDROCHLOROTHIAZIDE 25 MG PO TABS
25.0000 mg | ORAL_TABLET | Freq: Every day | ORAL | Status: DC
  Administered 2016-09-13 – 2016-09-14 (×2): 25 mg via ORAL
  Filled 2016-09-13 (×2): qty 1

## 2016-09-13 NOTE — Progress Notes (Addendum)
Progress Note    Charles [name]  [id number] DOB: [birth year]  DOA: 09/07/2016 PCP: [name], MD    Brief Narrative:   Chief complaint: F/U chest pain/ACS and atrial fibrillation   Charles [name] is an 70 y.o. male with a PMHx of CAD of native artery status post multiple stents, NSTEMI on 07/28/16, due to in stent restenosis of distal LAD and L circumflex stents which were treated with balloon angioplasty only (no re-stenting). (Other numerous stents were patent), chronic Systolic and Diastolic CHF, PAF (paroxysmal atrial fibrillation), SSS (sick sinus syndrome Pacer present (St. Jude Zephyr XL DR 5826, dual chamber), LBBB (left bundle branch block), HTN, HLD, Diabetes Type 2 uncontrolled with Complications, OSA on CPAP, and Nephrolithiasis  who was admitted 09/07/16 for evaluation of chest pain.  Assessment/Plan:   Principal Problem:   NSTEMI (non-ST elevated myocardial infarction) ([place]) in a patient with known coronary artery disease presenting with unstable angina  Records reviewed. Troponins elevated. Pt has had multiple stent procedures and now has in-stent restenosis.   Cardiac cath 09/08/16 showed severe restenosis circumflex and OM stents, diagonal stent and in the mid LAD stent. He had been on Plavix and this is now on hold. CABG planned for September 15, 2017 allowing for Plavix washout. Continue IV heparin, Imdur, ASA, statin, IV NTG. LDL 53 on 08/17/16. Had chest pressure this morning, gone now.  Discussed with Dr. [place] who feels that he is still having some angina, resolved with nitroglycerin.  Active Problems:   Constipation Give Sorbitol.    Uncontrolled type 2 diabetes mellitus with complication (HCC) Hemoglobin A1c done 08/03/16:7.1%. Currently being managed with U 500, 25 units twice a day moderate scale SSI. CBGs [id number].    Sick sinus syndrome ([place]) Has a pacemaker in situ.    OSA on CPAP Continue CPAP per respiratory.    Chronic pain  syndrome Continue current pain management regimen.    Cardiomyopathy, ischemic / Chronic combined systolic and diastolic CHF (congestive heart failure) (HCC) LVEF 35-40%. On Lisinopril 20 mg daily, sotalol. Weight up 2 pounds today. Resume HCTZ.   Family Communication/Anticipated D/C date and plan/Code Status   DVT prophylaxis: Heparin ordered. Code Status: Full Code.  Family Communication: Wife at the bedside. Disposition Plan: CABG planned for 09/15/16.   Medical Consultants:    Cardiology  CVTS   Procedures:    Cardiac cath 09/08/16: Rapid redevelopment of in-stent restenosis in each of the 3 sites treated in September.  PFTs 09/09/16  Carotid dopplers 09/10/16  LE dopplers with saphenous vien mapping 09/10/16  Anti-Infectives:    None  Subjective:   The patient moved his bowels with the sorbitol yesterday. Had an episode of chest pressure this morning. No associated dyspnea. Stated "It felt like someone had their claws in me". No current chest pain.  ROS negative for nausea/vomiting/diaphoresis.  Objective:    Vitals:   09/12/16 2104 09/13/16 0550 09/13/16 0553 09/13/16 0606  BP: 113/79 114/61 102/66   Pulse: 71 71    Resp:      Temp: 97.6 F (36.4 C) 97.7 F (36.5 C)    TempSrc: Oral Oral    SpO2: 98% 99%    Weight:    131.3 kg (289 lb 6.4 oz)  Height:        Intake/Output Summary (Last 24 hours) at 09/13/16 0722 Last data filed at 09/13/16 0448  Gross per 24 hour  Intake          1378.23  ml  Output             1900 ml  Net          -521.77 ml   Filed Weights   09/11/16 0500 09/12/16 0605 09/13/16 0606  Weight: 130.7 kg (288 lb 1.6 oz) 130.3 kg (287 lb 3.2 oz) 131.3 kg (289 lb 6.4 oz)    Exam: General exam: Appears calm and comfortable.  Respiratory system: Clear to auscultation. Respiratory effort normal. Cardiovascular system: S1 & S2 heard, RRR. No JVD,  rubs, gallops or clicks. No murmurs. Gastrointestinal system: Abdomen is softly  distended, soft and nontender. No organomegaly or masses felt. Normal bowel sounds heard. Central nervous system: Alert and oriented. No focal neurological deficits. Extremities: No clubbing,  or cyanosis. 2+ edema. Skin: No rashes, lesions or ulcers. Psychiatry: Judgement and insight appear normal. Mood & affect appropriate.   Data Reviewed:   I have personally reviewed following labs and imaging studies:  Labs: Basic Metabolic Panel:  Recent Labs Lab 09/07/16 0041 09/10/16 0612 09/12/16 0426  NA 139 136 137  K 4.1 4.1 3.9  CL 103 107 104  CO2 26 22 25   GLUCOSE 186* 179* 153*  BUN 23* 13 15  CREATININE 1.38* 1.09 1.24  CALCIUM 9.5 8.6* 9.1  MG  --   --  2.0   GFR Estimated Creatinine Clearance: 75.5 mL/min (by C-G formula based on SCr of 1.24 mg/dL). Liver Function Tests:  Recent Labs Lab 09/07/16 0041  AST 15  ALT 17  ALKPHOS 56  BILITOT 0.6  PROT 7.6  ALBUMIN 4.0   Coagulation profile  Recent Labs Lab 09/08/16 1003  INR 1.15    CBC:  Recent Labs Lab 09/07/16 0041  09/10/16 0612 09/10/16 0926 09/11/16 1008 09/12/16 0426 09/13/16 0338  WBC 6.4  < > 6.6 6.3 6.9 6.8 7.2  NEUTROABS 3.7  --   --   --   --   --   --   HGB 12.7*  < > 12.0* 12.4* 12.9* 12.1* 12.1*  HCT 39.1  < > 36.0* 37.5* 37.9* 36.9* 37.2*  MCV 90.3  < > 88.7 88.4 89.6 89.1 89.9  PLT 210  < > 180 198 175 189 178  < > = values in this interval not displayed. Cardiac Enzymes:  Recent Labs Lab 09/07/16 0721 09/07/16 1444 09/07/16 2002  TROPONINI 0.35* 0.62* 0.52*   BNP (last 3 results)  Recent Labs  10/21/15 0932  PROBNP 364.0*   CBG:  Recent Labs Lab 09/11/16 2101 09/12/16 0748 09/12/16 1126 09/12/16 1658 09/12/16 2100  GLUCAP 181* 158* 215* 229* 173*    Microbiology Recent Results (from the past 240 hour(s))  MRSA PCR Screening     Status: None   Collection Time: 09/07/16  7:22 AM  Result Value Ref Range Status   MRSA by PCR NEGATIVE NEGATIVE Final     Comment:        The GeneXpert MRSA Assay (FDA approved for NASAL specimens only), is one component of a comprehensive MRSA colonization surveillance program. It is not intended to diagnose MRSA infection nor to guide or monitor treatment for MRSA infections.   Surgical PCR screen     Status: None   Collection Time: 09/08/16 10:13 PM  Result Value Ref Range Status   MRSA, PCR NEGATIVE NEGATIVE Final   Staphylococcus aureus NEGATIVE NEGATIVE Final    Comment:        The Xpert SA Assay (FDA approved for NASAL specimens in  patients over 57 years of age), is one component of a comprehensive surveillance program.  Test performance has been validated by [facility] for patients greater than or equal to 12 year old. It is not intended to diagnose infection nor to guide or monitor treatment.     Radiology: No results found.  Medications:   . alfuzosin  10 mg Oral Q breakfast  . aspirin  81 mg Oral Daily  . carvedilol  3.125 mg Oral BID WC  . dicyclomine  10-20 mg Oral TID AC & HS  . insulin aspart  0-15 Units Subcutaneous TID WC  . insulin regular human CONCENTRATED  25 Units Subcutaneous BID WC  . isosorbide mononitrate  60 mg Oral Daily  . lisinopril  20 mg Oral Daily  . omeprazole  40 mg Oral QAC breakfast  . pantoprazole  40 mg Oral q morning - 10a  . rosuvastatin  5 mg Oral Q M,W,F  . sodium chloride flush  3 mL Intravenous Q12H  . sotalol  120 mg Oral BID   Continuous Infusions: . heparin 1,800 Units/hr (09/11/16 0706)    Medical decision making is of moderate complexity therefore this is a level 2 visit.     LOS: 6 days   [facility] Pager [phone]. If unable to reach me by pager, please call my cell phone at [phone].  *Please refer to [email or web address], password [email or web address] to get updated schedule on who will round on this patient, as hospitalists switch teams weekly. If 7PM-7AM, please contact night-coverage at [email or web address], password [email or web address] for  any overnight needs.  09/13/2016, 7:22 AM

## 2016-09-13 NOTE — Progress Notes (Signed)
Pt had episode of Chest Pressure 3/10, BP 114/61 HR 78 Paced. NTG SL given with complete resolution. Jessie Foot, RN

## 2016-09-13 NOTE — Progress Notes (Signed)
Patient Name: Charles [name] Date of Encounter: 09/13/2016  Primary Cardiologist: Dr. Debbe Odea Problem List     Principal Problem:   NSTEMI (non-ST elevated myocardial infarction) Charles [name]) Active Problems:   Diabetes mellitus type 2 in obese ([place])   Unstable angina ([place])   Coronary artery disease involving native coronary artery of native heart   Uncontrolled type 2 diabetes mellitus with complication (HCC)   Chronic combined systolic and diastolic CHF (congestive heart failure) (HCC)   Sick sinus syndrome (HCC)   OSA on CPAP   Chronic pain syndrome   Cardiomyopathy, ischemic     Subjective   Feels well, had some chest tightness this morning that was relieved with Nitro. No SOB.   Inpatient Medications    Scheduled Meds: . alfuzosin  10 mg Oral Q breakfast  . aspirin  81 mg Oral Daily  . carvedilol  3.125 mg Oral BID WC  . dicyclomine  10-20 mg Oral TID AC & HS  . insulin aspart  0-15 Units Subcutaneous TID WC  . insulin regular human CONCENTRATED  25 Units Subcutaneous BID WC  . isosorbide mononitrate  60 mg Oral Daily  . lisinopril  20 mg Oral Daily  . omeprazole  40 mg Oral QAC breakfast  . pantoprazole  40 mg Oral q morning - 10a  . rosuvastatin  5 mg Oral Q M,W,F  . sodium chloride flush  3 mL Intravenous Q12H  . sotalol  120 mg Oral BID   Continuous Infusions: . heparin 1,800 Units/hr (09/11/16 0706)   PRN Meds: sodium chloride, acetaminophen, HYDROcodone-acetaminophen, magnesium hydroxide, nitroGLYCERIN, ondansetron (ZOFRAN) IV, sodium chloride flush, traMADol-acetaminophen   Vital Signs    Vitals:   09/12/16 2104 09/13/16 0550 09/13/16 0553 09/13/16 0606  BP: 113/79 114/61 102/66   Pulse: 71 71    Resp:      Temp: 97.6 F (36.4 C) 97.7 F (36.5 C)    TempSrc: Oral Oral    SpO2: 98% 99%    Weight:    289 lb 6.4 oz (131.3 kg)  Height:        Intake/Output Summary (Last 24 hours) at 09/13/16 0805 Last data filed at 09/13/16 0448  Gross per 24 hour  Intake          1378.23 ml  Output             1900 ml  Net          -521.77 ml   Filed Weights   09/11/16 0500 09/12/16 0605 09/13/16 0606  Weight: 288 lb 1.6 oz (130.7 kg) 287 lb 3.2 oz (130.3 kg) 289 lb 6.4 oz (131.3 kg)    Physical Exam   GEN: Well nourished, well developed, in no acute distress.  HEENT: Grossly normal.  Neck: Supple, no JVD, carotid bruits, or masses. Cardiac: RRR, no murmurs, rubs, or gallops. No clubbing, cyanosis, edema.  Radials/DP/PT 2+ and equal bilaterally.  Respiratory:  Respirations regular and unlabored, clear to auscultation bilaterally. GI: Soft, nontender, nondistended, BS + x 4. MS: no deformity or atrophy. Skin: warm and dry, no rash. Neuro:  Strength and sensation are intact. Psych: AAOx3.  Normal affect.  Labs    CBC  Recent Labs  09/12/16 0426 09/13/16 0338  WBC 6.8 7.2  HGB 12.1* 12.1*  HCT 36.9* 37.2*  MCV 89.1 89.9  PLT 189 0000000   Basic Metabolic Panel  Recent Labs  09/12/16 0426  NA 137  K 3.9  CL 104  CO2 25  GLUCOSE 153*  BUN 15  CREATININE 1.24  CALCIUM 9.1  MG 2.0    Telemetry    Atrial paced - Personally Reviewed  ECG    Atrial paced - Personally Reviewed  Radiology    No results found.  Cardiac Studies   Intravascular Pressure Wire/FFR Study  Left Heart Cath and Coronary Angiography 09/08/16   1st Diag lesion, 75 %stenosed.  Prox Cx lesion, 0 %stenosed.  Ost RCA to Prox RCA lesion, 10 %stenosed.  Mid Cx lesion, 80 %stenosed.  Ost 2nd Mrg to 2nd Mrg lesion, 90 %stenosed.  Prox LAD lesion, 65 %stenosed.  Ost 1st Diag to 1st Diag lesion, 99 %stenosed.  RPDA-1 lesion, 70 %stenosed.  RPDA-2 lesion, 75 %stenosed.  Dist LAD lesion, 80 %stenosed.  Ost Ramus to Ramus lesion, 95 %stenosed.  Mid Cx to Dist Cx lesion, 90 %stenosed.  The left ventricular ejection fraction is 35-45% by visual estimate.  There is moderate left ventricular systolic dysfunction.  LV end  diastolic pressure is mildly elevated.    Rapid redevelopment of in-stent restenosis in each of the 3 sites treated in September.  FFR across the mid LAD stent is 0.81. Note that there is significant distal LAD disease beyond this lesion which is likely causing the FFR value to be falsely elevated. In other words, the LAD FFR in the absence of distal disease could easily be less than 0.8.  Significant disease is noted in the mid PDA, mid and distal circumflex (ISR), small first obtuse marginal, large second obtuse (ISR) marginal, second diagonal (ISR), with intermediate angiographic stenosis in the mid LAD stent (ISR). The distal LAD proximal to the apex contains segmental 80% narrowing.  Global left ventricular dysfunction with regional variation. EF 35-45%.  RECOMMENDATIONS:  Consider surgical revascularization if possible.   Diagnostic Diagram           Patient Profile     70 year old male with a past medical history of CAD, NSTEMI on 9/6, this was due to in stent restenosis of distal LAD and L circumflex stentswhich were treated with balloon angioplasty only (no re-stenting). Other numerous stents were patent. Chronic Systolic and Diastolic CHF, PAF (paroxysmal atrial fibrillation), SSS (sick sinus syndrome Pacer present(St. Jude Zephyr XL DR 5826, dual chamber), LBBB (left bundle branch block), HTN, HLD, Diabetes Type 2 uncontrolled with Complications, OSA on CPAP, Nephrolithiasis. For CABG on Wed. 09/15/16  after Plavix wash out.     Assessment & Plan  1. CAD/NSTEMI:  Cardiac cath 09/08/16 with severe restenosis circumflex and OM stents, Diagonal stent and in the mid LAD stent. He had been on Plavix and this is now on hold. Planning for CABG on Wed. 09/15/16.   Will continue IV heparin, ASA, statin, IV NTG.  2. Ischemic cardiomyopathy: LVEF 35-40%. On Lisinopril 20 mg daily, sotalol.   3. Constipation: Can start Miralax for relief.   4. PAF: This patients  CHA2DS2-VASc Score and unadjusted Ischemic Stroke Rate (% per year) is equal to 4.8 % stroke rate/year from a score of 4 Above score calculated as 1 point each if present [CHF, HTN, DM, Vascular=MI/PAD/Aortic Plaque, Age if 65-74, or Male], 2 points each if present [Age > 75, or Stroke/TIA/TE]    On heparin gtt now, was on Xarelto outpatient which is on hold.   5. SSS s/p St. Jude PPM: followed by Dr. [name]  Signed, Arbutus Leas, NP  09/13/2016, 8:05 AM  Patient seen and examined and history reviewed.  Agree with above findings and plan. Patient is doing well. Minor angina relieved with Ntg. Awaiting Plavix wash out for CABG on Wednesday. Plan on resuming Xarelto post op. Reassess LV function 2-3 months post op revascularization. Currently in A paced rhythm.  Jaivion Kingsley [place], [place] 09/13/2016 10:53 AM

## 2016-09-13 NOTE — Care Management Note (Signed)
Case Management Note  Patient Details  Name: Charles [name] MRN: [id number] Date of Birth: 11/27/1945  Subjective/Objective:   Pt presented for Chest pain, NSTEMI- post cath revealed CAD with 3 vessel disease. Plan for CABG 09-15-16 due to Plavix washout. Pt is from home with wife. Plan to return home once stable.                  Action/Plan: CM did speak with pt in regards to disposition needs. Wife had concerns to [place] and toilet riser. CM did state that both can be found at [facility]. Pt has used them in the past. CM did make pt aware that insurance will only cover a small portion of the motor and rest of the expense to the family. Wife wanted to rent the chair and [facility] does not rent the chairs. CM did call [facility] and they only have medium chairs to rent. [facility] rents the lift chairs, however none available to rent until Nov 20th. CM will make family aware. Toilet riser to be purchased from [facility]. CM will continue to monitor post procedure.  Expected Discharge Date:                  Expected Discharge Plan:  [name]  In-House Referral:     Discharge planning Services  CM Consult  Post Acute Care Choice:    Choice offered to:     DME Arranged:    DME Agency:     HH Arranged:    [place] Agency:     Status of Service:  In process, will continue to follow  If discussed at Long Length of Stay Meetings, dates discussed:  09-14-16  Additional Comments:  Bethena Roys, RN 09/13/2016, 3:17 PM

## 2016-09-13 NOTE — Progress Notes (Signed)
[place] for heparin Indication: chest pain/ACS and atrial fibrillation   Assessment: 70 y.o. male with on Xarelto PTA for afib, s/p cath 10/18, awaiting CABG 10/25 (plavix washout). Holding Xarelto on IV heparin for anticoagulation.   Heparin level (0.55) is therapeutic on 1800 units/hr. Hemoglobin and platelet counts are stable. No bleeding noted.   Goal of Therapy:  Heparin level 0.3-0.7 units/ml Monitor platelets by anticoagulation protocol: Yes   Plan:  Continue Heparin infusion 1800 units/hr  Monitor for s/sx bleeding CBC, heparin levels daily while on heparin.  Allergies  Allergen Reactions  . Chocolate Hives, Shortness Of Breath and Swelling  . Statins Other (See Comments)    Mental changes, muscle aches  . Black Pepper [[place]] Hives  . Codeine Itching  . Oxytetracycline Other (See Comments)    Flushing in sunlight  . Tape Rash and Other (See Comments)    SKIN IS VERY SENSITIVE!!    Patient Measurements: Height: 5\' 9"  (175.3 cm) Weight: 289 lb 6.4 oz (131.3 kg) IBW/kg (Calculated) : 70.7 Heparin Dosing Weight: 100kg  Vital Signs: Temp: 97.7 F (36.5 C) (10/23 0550) Temp Source: Oral (10/23 0550) BP: 102/66 (10/23 0553) Pulse Rate: 71 (10/23 0550)  Labs:  Recent Labs  09/11/16 1008 09/12/16 0426 09/13/16 0338  HGB 12.9* 12.1* 12.1*  HCT 37.9* 36.9* 37.2*  PLT 175 189 178  HEPARINUNFRC 0.48 0.63 0.55  CREATININE  --  1.24  --     Estimated Creatinine Clearance: 75.5 mL/min (by C-G formula based on SCr of 1.24 mg/dL).  Uvaldo Rising, BCPS  Clinical Pharmacist Pager [phone]  09/13/2016 7:33 AM

## 2016-09-14 ENCOUNTER — Inpatient Hospital Stay (HOSPITAL_COMMUNITY): Payer: BLUE CROSS/BLUE SHIELD

## 2016-09-14 DIAGNOSIS — Z794 Long term (current) use of insulin: Secondary | ICD-10-CM

## 2016-09-14 DIAGNOSIS — I25110 Atherosclerotic heart disease of native coronary artery with unstable angina pectoris: Secondary | ICD-10-CM

## 2016-09-14 LAB — CBC
HEMATOCRIT: 36.8 % — AB (ref 39.0–52.0)
HEMOGLOBIN: 12.3 g/dL — AB (ref 13.0–17.0)
MCH: 29.8 pg (ref 26.0–34.0)
MCHC: 33.4 g/dL (ref 30.0–36.0)
MCV: 89.1 fL (ref 78.0–100.0)
Platelets: 188 10*3/uL (ref 150–400)
RBC: 4.13 MIL/uL — ABNORMAL LOW (ref 4.22–5.81)
RDW: 14.3 % (ref 11.5–15.5)
WBC: 7.1 10*3/uL (ref 4.0–10.5)

## 2016-09-14 LAB — BLOOD GAS, ARTERIAL
ACID-BASE DEFICIT: 2.4 mmol/L — AB (ref 0.0–2.0)
BICARBONATE: 21.6 mmol/L (ref 20.0–28.0)
Drawn by: 301361
FIO2: 21
O2 Saturation: 96.6 %
PATIENT TEMPERATURE: 97.4
PH ART: 7.411 (ref 7.350–7.450)
PO2 ART: 87 mmHg (ref 83.0–108.0)
pCO2 arterial: 34.5 mmHg (ref 32.0–48.0)

## 2016-09-14 LAB — GLUCOSE, CAPILLARY
GLUCOSE-CAPILLARY: 166 mg/dL — AB (ref 65–99)
GLUCOSE-CAPILLARY: 232 mg/dL — AB (ref 65–99)
GLUCOSE-CAPILLARY: 166 mg/dL — AB (ref 65–99)
Glucose-Capillary: 212 mg/dL — ABNORMAL HIGH (ref 65–99)

## 2016-09-14 LAB — TYPE AND SCREEN
ABO/RH(D): A POS
Antibody Screen: NEGATIVE

## 2016-09-14 LAB — PROTIME-INR
INR: 1.11
Prothrombin Time: 14.3 seconds (ref 11.4–15.2)

## 2016-09-14 LAB — COMPREHENSIVE METABOLIC PANEL
ALT: 64 U/L — ABNORMAL HIGH (ref 17–63)
AST: 39 U/L (ref 15–41)
Albumin: 4 g/dL (ref 3.5–5.0)
Alkaline Phosphatase: 62 U/L (ref 38–126)
Anion gap: 11 (ref 5–15)
BUN: 14 mg/dL (ref 6–20)
CO2: 23 mmol/L (ref 22–32)
Calcium: 9.3 mg/dL (ref 8.9–10.3)
Chloride: 102 mmol/L (ref 101–111)
Creatinine, Ser: 1.21 mg/dL (ref 0.61–1.24)
GFR calc Af Amer: >60 mL/min (ref >60)
GFR calc non Af Amer: 59 mL/min — ABNORMAL LOW (ref >60)
Glucose, Bld: 232 mg/dL — ABNORMAL HIGH (ref 65–99)
Potassium: 4.2 mmol/L (ref 3.5–5.1)
Sodium: 136 mmol/L (ref 135–145)
Total Bilirubin: 0.5 mg/dL (ref 0.3–1.2)
Total Protein: 7.1 g/dL (ref 6.5–8.1)

## 2016-09-14 LAB — URINALYSIS, ROUTINE W REFLEX MICROSCOPIC
Bilirubin Urine: NEGATIVE
Glucose, UA: NEGATIVE mg/dL
Hgb urine dipstick: NEGATIVE
Ketones, ur: NEGATIVE mg/dL
Leukocytes, UA: NEGATIVE
Nitrite: NEGATIVE
Protein, ur: NEGATIVE mg/dL
Specific Gravity, Urine: 1.012 (ref 1.005–1.030)
pH: 6 (ref 5.0–8.0)

## 2016-09-14 LAB — ABO/RH: ABO/RH(D): A POS

## 2016-09-14 LAB — HEPARIN LEVEL (UNFRACTIONATED): Heparin Unfractionated: 0.57 IU/mL (ref 0.30–0.70)

## 2016-09-14 LAB — APTT: aPTT: 70 seconds — ABNORMAL HIGH (ref 24–36)

## 2016-09-14 MED ORDER — DEXTROSE 5 % IV SOLN
750.0000 mg | INTRAVENOUS | Status: DC
  Filled 2016-09-14: qty 750

## 2016-09-14 MED ORDER — DEXMEDETOMIDINE HCL IN NACL 400 MCG/100ML IV SOLN
0.1000 ug/kg/h | INTRAVENOUS | Status: DC
  Filled 2016-09-14: qty 100

## 2016-09-14 MED ORDER — CHLORHEXIDINE GLUCONATE CLOTH 2 % EX PADS: 6.0000 | MEDICATED_PAD | Freq: Once | CUTANEOUS | Status: DC

## 2016-09-14 MED ORDER — TEMAZEPAM 15 MG PO CAPS
15.0000 mg | ORAL_CAPSULE | Freq: Once | ORAL | Status: AC | PRN
Start: 2016-09-14 — End: 2016-09-14
  Administered 2016-09-14: 15 mg via ORAL
  Filled 2016-09-14: qty 1

## 2016-09-14 MED ORDER — SODIUM CHLORIDE 0.9 % IV SOLN
INTRAVENOUS | Status: DC
  Filled 2016-09-14: qty 30

## 2016-09-14 MED ORDER — SODIUM CHLORIDE 0.9 % IV SOLN
INTRAVENOUS | Status: DC
  Filled 2016-09-14: qty 2.5

## 2016-09-14 MED ORDER — SODIUM CHLORIDE 0.9 % IV SOLN
1.5000 mg/kg/h | INTRAVENOUS | Status: AC
  Administered 2016-09-15: 1.5 mg/kg/h via INTRAVENOUS
  Filled 2016-09-14: qty 25

## 2016-09-14 MED ORDER — DEXTROSE 5 % IV SOLN
1.5000 g | INTRAVENOUS | Status: DC
  Filled 2016-09-14: qty 1.5

## 2016-09-14 MED ORDER — BISACODYL 5 MG PO TBEC
5.0000 mg | DELAYED_RELEASE_TABLET | Freq: Once | ORAL | Status: AC
  Administered 2016-09-14: 5 mg via ORAL
  Filled 2016-09-14: qty 1

## 2016-09-14 MED ORDER — EPINEPHRINE PF 1 MG/ML IJ SOLN
0.0000 ug/min | INTRAVENOUS | Status: DC
  Filled 2016-09-14 (×2): qty 4

## 2016-09-14 MED ORDER — PHENYLEPHRINE HCL 10 MG/ML IJ SOLN
30.0000 ug/min | INTRAVENOUS | Status: DC
  Filled 2016-09-14: qty 2

## 2016-09-14 MED ORDER — VANCOMYCIN HCL 10 G IV SOLR
1500.0000 mg | INTRAVENOUS | Status: DC
  Filled 2016-09-14: qty 1500

## 2016-09-14 MED ORDER — POTASSIUM CHLORIDE 2 MEQ/ML IV SOLN
80.0000 meq | INTRAVENOUS | Status: DC
Start: 2016-09-15 — End: 2016-09-15
  Filled 2016-09-14: qty 40

## 2016-09-14 MED ORDER — MAGNESIUM SULFATE 50 % IJ SOLN
40.0000 meq | INTRAMUSCULAR | Status: DC
  Filled 2016-09-14: qty 10

## 2016-09-14 MED ORDER — PLASMA-LYTE 148 IV SOLN
INTRAVENOUS | Status: AC
  Administered 2016-09-15: 500 mL
  Filled 2016-09-14: qty 2.5

## 2016-09-14 MED ORDER — NITROGLYCERIN IN D5W 200-5 MCG/ML-% IV SOLN
2.0000 ug/min | INTRAVENOUS | Status: AC
  Administered 2016-09-15: 5 ug/min via INTRAVENOUS
  Filled 2016-09-14: qty 250

## 2016-09-14 MED ORDER — CHLORHEXIDINE GLUCONATE CLOTH 2 % EX PADS
6.0000 | MEDICATED_PAD | Freq: Once | CUTANEOUS | Status: AC
  Administered 2016-09-14: 6 via TOPICAL

## 2016-09-14 MED ORDER — DOPAMINE-DEXTROSE 3.2-5 MG/ML-% IV SOLN
0.0000 ug/kg/min | INTRAVENOUS | Status: AC
  Administered 2016-09-15: 3 ug/kg/min via INTRAVENOUS
  Filled 2016-09-14: qty 250

## 2016-09-14 MED ORDER — TRANEXAMIC ACID (OHS) PUMP PRIME SOLUTION
2.0000 mg/kg | INTRAVENOUS | Status: DC
  Filled 2016-09-14: qty 2.61

## 2016-09-14 MED ORDER — METOPROLOL TARTRATE 12.5 MG HALF TABLET
12.5000 mg | ORAL_TABLET | Freq: Once | ORAL | Status: AC
  Administered 2016-09-15: 12.5 mg via ORAL
  Filled 2016-09-14: qty 1

## 2016-09-14 MED ORDER — CHLORHEXIDINE GLUCONATE 0.12 % MT SOLN
15.0000 mL | Freq: Once | OROMUCOSAL | Status: AC
  Administered 2016-09-15: 15 mL via OROMUCOSAL
  Filled 2016-09-14: qty 15

## 2016-09-14 MED ORDER — TRANEXAMIC ACID (OHS) BOLUS VIA INFUSION
15.0000 mg/kg | INTRAVENOUS | Status: AC
  Administered 2016-09-15: 1957.5 mg via INTRAVENOUS
  Filled 2016-09-14: qty 1958

## 2016-09-14 NOTE — Progress Notes (Signed)
Patient Name: Charles [name] Date of Encounter: 09/14/2016  Primary Cardiologist: Dr [facility] Problem List     Principal Problem:   NSTEMI (non-ST elevated myocardial infarction) [facility]) Active Problems:   Diabetes mellitus type 2 in obese ([place])   Unstable angina ([place])   Coronary artery disease involving native coronary artery of native heart   Uncontrolled type 2 diabetes mellitus with complication (HCC)   Chronic combined systolic and diastolic CHF (congestive heart failure) (HCC)   Sick sinus syndrome (HCC)   OSA on CPAP   Chronic pain syndrome   Cardiomyopathy, ischemic     Subjective   No chest pain, no SOB, urinated frequently yesterday after HCTZ restarted. LE edema better today. No dizziness or presyncope.  Inpatient Medications    Scheduled Meds: . alfuzosin  10 mg Oral Q breakfast  . aspirin  81 mg Oral Daily  . bisacodyl  5 mg Oral Once  . carvedilol  3.125 mg Oral BID WC  . [START ON 09/15/2016] chlorhexidine  15 mL Mouth/Throat Once  . Chlorhexidine Gluconate Cloth  6 each Topical Once   And  . Chlorhexidine Gluconate Cloth  6 each Topical Once  . dicyclomine  10-20 mg Oral TID AC & HS  . hydrochlorothiazide  25 mg Oral Daily  . insulin aspart  0-15 Units Subcutaneous TID WC  . insulin regular human CONCENTRATED  25 Units Subcutaneous BID WC  . isosorbide mononitrate  60 mg Oral Daily  . [START ON 09/15/2016] metoprolol tartrate  12.5 mg Oral Once  . pantoprazole  40 mg Oral q morning - 10a  . rosuvastatin  5 mg Oral Q M,W,F  . sodium chloride flush  3 mL Intravenous Q12H  . sotalol  120 mg Oral BID   Continuous Infusions: . heparin 1,800 Units/hr (09/11/16 0706)   PRN Meds: sodium chloride, acetaminophen, HYDROcodone-acetaminophen, magnesium hydroxide, nitroGLYCERIN, ondansetron (ZOFRAN) IV, sodium chloride flush, sorbitol, temazepam, traMADol-acetaminophen   Vital Signs    Vitals:   09/13/16 1512 09/13/16 2213 09/14/16 0541 09/14/16  0852  BP: 130/73 128/79 133/77 133/77  Pulse: 74 70 74 75  Resp:      Temp: 97.6 F (36.4 C) 97.8 F (36.6 C) 97.4 F (36.3 C)   TempSrc: Oral Oral Oral   SpO2: 100% 99% 98%   Weight:   287 lb 12.8 oz (130.5 kg)   Height:        Intake/Output Summary (Last 24 hours) at 09/14/16 0915 Last data filed at 09/14/16 0543  Gross per 24 hour  Intake                0 ml  Output              500 ml  Net             -500 ml   Filed Weights   09/12/16 0605 09/13/16 0606 09/14/16 0541  Weight: 287 lb 3.2 oz (130.3 kg) 289 lb 6.4 oz (131.3 kg) 287 lb 12.8 oz (130.5 kg)    Physical Exam   GEN: Well nourished, well developed, in no acute distress.  HEENT: Grossly normal.  Neck: Supple, no JVD, carotid bruits, or masses. Cardiac: RRR, no murmurs, rubs, or gallops. No clubbing, cyanosis, trace LE edema.  Radials/DP/PT 2+ and equal bilaterally.  Respiratory:  Respirations regular and unlabored, clear to auscultation bilaterally. GI: Soft, nontender, nondistended, BS + x 4. MS: no deformity or atrophy. Skin: warm and dry, no rash. Neuro:  Strength and sensation are intact. Psych: AAOx3.  Normal affect.  Labs    CBC  Recent Labs  09/13/16 0338 09/14/16 0321  WBC 7.2 7.1  HGB 12.1* 12.3*  HCT 37.2* 36.8*  MCV 89.9 89.1  PLT 178 0000000   Basic Metabolic Panel  Recent Labs  09/12/16 0426  NA 137  K 3.9  CL 104  CO2 25  GLUCOSE 153*  BUN 15  CREATININE 1.24  CALCIUM 9.1  MG 2.0   Telemetry    A pacing and/or V pacing at times 1 episode ?CHB vs Wenkebach w/ Vpacing at 40 bpm>>intrinsic 2:1 AV block  - Personally Reviewed  ECG    10/18- Personally Reviewed  Radiology    No results found.  Cardiac Studies  10/18  1st Diag lesion, 75 %stenosed.  Prox Cx lesion, 0 %stenosed.  Ost RCA to Prox RCA lesion, 10 %stenosed.  Mid Cx lesion, 80 %stenosed.  Ost 2nd Mrg to 2nd Mrg lesion, 90 %stenosed.  Prox LAD lesion, 65 %stenosed.  Ost 1st Diag to 1st Diag  lesion, 99 %stenosed.  RPDA-1 lesion, 70 %stenosed.  RPDA-2 lesion, 75 %stenosed.  Dist LAD lesion, 80 %stenosed.  Ost Ramus to Ramus lesion, 95 %stenosed.  Mid Cx to Dist Cx lesion, 90 %stenosed.  The left ventricular ejection fraction is 35-45% by visual estimate.  There is moderate left ventricular systolic dysfunction.  LV end diastolic pressure is mildly elevated.   Rapid redevelopment of in-stent restenosis in each of the 3 sites treated in September.  FFR across the mid LAD stent is 0.81. Note that there is significant distal LAD disease beyond this lesion which is likely causing the FFR value to be falsely elevated. In other words, the LAD FFR in the absence of distal disease could easily be less than 0.8.  Significant disease is noted in the mid PDA, mid and distal circumflex (ISR), small first obtuse marginal, large second obtuse (ISR) marginal, second diagonal (ISR), with intermediate angiographic stenosis in the mid LAD stent (ISR). The distal LAD proximal to the apex contains segmental 80% narrowing.  Global left ventricular dysfunction with regional variation. EF 35-45%. RECOMMENDATIONS:  Consider surgical revascularization if possible.  Patient Profile     70 year old male with a past medical history of CAD, NSTEMI on 9/6, this was due to in stent restenosis of distal LAD and L circumflex stentswhich were treated with balloon angioplasty only (no re-stenting). Other numerous stents were patent. Chronic Systolic and Diastolic CHF, PAF (paroxysmal atrial fibrillation), SSS (sick sinus syndrome Pacer present(St. Jude Zephyr XL DR 5826, dual chamber), LBBB (left bundle branch block), HTN, HLD, Diabetes Type 2 uncontrolled with Complications, OSA on CPAP, Nephrolithiasis. For CABG on Wed. 09/15/16  after Plavix wash out.   Assessment & Plan   1. CAD/NSTEMI: Cardiac cath 09/08/16 with severe restenosis circumflex and OM stents, Diagonal stent and in the mid LAD  stent. He had been on Plavix and this is now on hold. Planning for CABG on Wed. 09/15/16.   Will continue IV heparin, ASA, statin, Imdur, BB.  2. Ischemic cardiomyopathy:LVEF 35-40%. On low-dose Coreg, think BP will tolerate and increase, but w/ low HR episode, will discuss w/ MD.   3. Constipation: On Miralax for relief.   4. PAF: This patients CHA2DS2-VASc = 4 Above score calculated as 1 point each if present [CHF, HTN, DM, Vascular=MI/PAD/Aortic Plaque, Age if 65-74, or Male], 2 points each if present [Age > 75, or Stroke/TIA/TE]   On  heparin gtt now, was on Xarelto outpatient which is on hold.   5. SSS s/p St. Jude PPM: followed by Dr. [name]   Signed, Barrett, Rhonda, PA-C  09/14/2016, 9:15 AM  Patient seen and examined and history reviewed. Agree with above findings and plan. Doing well without recurrent angina. Questions answered concerning surgery. Stable for CABG tomorrow.  Peter Martinique, [place] 09/14/2016 11:33 AM

## 2016-09-14 NOTE — Progress Notes (Signed)
[place] for heparin Indication: chest pain/ACS and atrial fibrillation   Assessment: 70 y.o. male with on Xarelto PTA for afib, s/p cath 10/18, awaiting CABG 10/25 (plavix washout). Holding Xarelto on IV heparin for anticoagulation.   Heparin level (0.57) is therapeutic on 1800 units/hr. Hemoglobin and platelet counts are stable. No bleeding noted.   Goal of Therapy:  Heparin level 0.3-0.7 units/ml Monitor platelets by anticoagulation protocol: Yes   Plan:  Continue Heparin infusion 1800 units/hr  Monitor for s/sx bleeding CBC, heparin levels daily while on heparin. CABG scheduled tomorrow.  Allergies  Allergen Reactions  . Chocolate Hives, Shortness Of Breath and Swelling  . Statins Other (See Comments)    Mental changes, muscle aches  . Black Pepper [[place]] Hives  . Codeine Itching  . Oxytetracycline Other (See Comments)    Flushing in sunlight  . Tape Rash and Other (See Comments)    SKIN IS VERY SENSITIVE!!    Patient Measurements: Height: 5\' 9"  (175.3 cm) Weight: 287 lb 12.8 oz (130.5 kg) IBW/kg (Calculated) : 70.7 Heparin Dosing Weight: 100kg  Medications: . heparin 1,800 Units/hr (09/11/16 0706)    Labs:  Recent Labs  09/12/16 0426 09/13/16 0338 09/14/16 0321  HGB 12.1* 12.1* 12.3*  HCT 36.9* 37.2* 36.8*  PLT 189 178 188  HEPARINUNFRC 0.63 0.55 0.57  CREATININE 1.24  --   --     Estimated Creatinine Clearance: 75.2 mL/min (by C-G formula based on SCr of 1.24 mg/dL).  Uvaldo Rising, BCPS  Clinical Pharmacist Pager [phone]  09/14/2016 7:51 AM

## 2016-09-14 NOTE — Progress Notes (Signed)
[address]       ,[place] [address]             [phone]                 6 Days Post-Op Procedure(s) (LRB): Left Heart Cath and Coronary Angiography (N/A) Intravascular Pressure Wire/FFR Study (N/A)  LOS: 7 days   Subjective: Up in chair feels well, no chest pain today  Objective: Vital signs in last 24 hours: Patient Vitals for the past 24 hrs:  BP Temp Temp src Pulse SpO2 Weight  09/14/16 0852 133/77 - - 75 - -  09/14/16 0541 133/77 97.4 F (36.3 C) Oral 74 98 % 287 lb 12.8 oz (130.5 kg)  09/13/16 2213 128/79 97.8 F (36.6 C) Oral 70 99 % -  09/13/16 1512 130/73 97.6 F (36.4 C) Oral 74 100 % -    Filed Weights   09/12/16 0605 09/13/16 0606 09/14/16 0541  Weight: 287 lb 3.2 oz (130.3 kg) 289 lb 6.4 oz (131.3 kg) 287 lb 12.8 oz (130.5 kg)    Hemodynamic parameters for last 24 hours:    Intake/Output from previous day: 10/23 0701 - 10/24 0700 In: -  Out: 500 [Urine:500] Intake/Output this shift: Total I/O In: 240 [P.O.:240] Out: -   Scheduled Meds: . alfuzosin  10 mg Oral Q breakfast  . aspirin  81 mg Oral Daily  . bisacodyl  5 mg Oral Once  . carvedilol  3.125 mg Oral BID WC  . [START ON 09/15/2016] chlorhexidine  15 mL Mouth/Throat Once  . Chlorhexidine Gluconate Cloth  6 each Topical Once   And  . Chlorhexidine Gluconate Cloth  6 each Topical Once  . dicyclomine  10-20 mg Oral TID AC & HS  . hydrochlorothiazide  25 mg Oral Daily  . insulin aspart  0-15 Units Subcutaneous TID WC  . insulin regular human CONCENTRATED  25 Units Subcutaneous BID WC  . isosorbide mononitrate  60 mg Oral Daily  . [START ON 09/15/2016] metoprolol tartrate  12.5 mg Oral Once  . pantoprazole  40 mg Oral q morning - 10a  . rosuvastatin  5 mg Oral Q M,W,F  . sodium chloride flush  3 mL Intravenous Q12H  . sotalol  120 mg Oral BID   Continuous Infusions: . heparin 1,800 Units/hr (09/14/16 1100)   PRN Meds:.sodium chloride, acetaminophen,  HYDROcodone-acetaminophen, magnesium hydroxide, nitroGLYCERIN, ondansetron (ZOFRAN) IV, sodium chloride flush, sorbitol, temazepam, traMADol-acetaminophen  General appearance: alert and cooperative Neurologic: intact Heart: regular rate and rhythm, S1, S2 normal, no murmur, click, rub or gallop Lungs: clear to auscultation bilaterally Abdomen: soft, non-tender; bowel sounds normal; no masses,  no organomegaly Extremities: extremities normal, atraumatic, no cyanosis or edema and Homans sign is negative, no sign of DVT  qa paced  Lab Results: CBC: Recent Labs  09/13/16 0338 09/14/16 0321  WBC 7.2 7.1  HGB 12.1* 12.3*  HCT 37.2* 36.8*  PLT 178 188   BMET:  Recent Labs  09/12/16 0426 09/14/16 0904  NA 137 136  K 3.9 4.2  CL 104 102  CO2 25 23  GLUCOSE 153* 232*  BUN 15 14  CREATININE 1.24 1.21  CALCIUM 9.1 9.3    PT/INR:  Recent Labs  09/14/16 0904  LABPROT 14.3  INR 1.11     Radiology Dg Chest 2 View  Result Date: 09/14/2016 CLINICAL DATA:  Preop for coronary artery bypass graft. EXAM: CHEST  2 VIEW COMPARISON:  Radiographs  of September 07, 2016. FINDINGS: The heart size and mediastinal contours are within normal limits. Both lungs are clear. No pneumothorax or pleural effusion is noted. Atherosclerosis of thoracic aorta is noted. Left-sided pacemaker is unchanged in position. The visualized skeletal structures are unremarkable. IMPRESSION: No active cardiopulmonary disease.  Aortic atherosclerosis. Electronically Signed   By: Marijo Conception, M.D.   On: 09/14/2016 09:40     Assessment/Plan: S/P Procedure(s) (LRB): Left Heart Cath and Coronary Angiography (N/A) Intravascular Pressure Wire/FFR Study (N/A)    Plavix held , P2Y12 nl range , renal function stable  Plan to proceed with CABG in am, and discussed with patient placement of atrial clip.   The goals risks and alternatives of the planned surgical procedure  CABG possible placement of Atrial Clip have been  discussed with the patient in detail. The risks of the procedure including death, infection, stroke, myocardial infarction, bleeding, blood transfusion have all been discussed specifically.  I have quoted Charles [name] a 3 % of perioperative mortality and a complication rate as high as 40 %. The patient's questions have been answered.Charles [name] is willing  to proceed with the planned procedure.   Grace Isaac MD 09/14/2016 11:18 AM     Patient ID: Charles [name], male   DOB: 08/05/1946, 70 y.o.   MRN: [id number]

## 2016-09-14 NOTE — Progress Notes (Signed)
Found patient already on home CPAP unit with no assistance needed. Nurse aware.

## 2016-09-14 NOTE — Progress Notes (Signed)
RT went to get ABG and patient was not available. RT will check back shortly.

## 2016-09-14 NOTE — Anesthesia Preprocedure Evaluation (Addendum)
Anesthesia Evaluation  Patient identified by MRN, date of birth, ID band Patient awake    Reviewed: Allergy & Precautions, NPO status , Patient's Chart, lab work & pertinent test results  Airway Mallampati: II  TM Distance: >3 FB Neck ROM: Full    Dental  (+) Dental Advisory Given   Pulmonary asthma , sleep apnea , former smoker,    breath sounds clear to auscultation       Cardiovascular hypertension, Pt. on medications and Pt. on home beta blockers + angina + CAD, + Past MI, + Cardiac Stents, + Peripheral Vascular Disease and +CHF  + dysrhythmias Atrial Fibrillation + pacemaker  Rhythm:Regular Rate:Normal  Left ventricle: Abnormal septal motion The cavity size was mildly   dilated. Wall thickness was increased in a pattern of severe LVH.   Systolic function was mildly reduced. The estimated ejection   fraction was in the range of 45% to 50%. Wall motion was normal;   there were no regional wall motion abnormalities. Doppler   parameters are consistent with abnormal left ventricular   relaxation (grade 1 diastolic dysfunction). - Left atrium: The atrium was mildly dilated. - Atrial septum: No defect or patent foramen ovale was identified.   Neuro/Psych negative neurological ROS     GI/Hepatic Neg liver ROS, GERD  ,  Endo/Other  diabetes, Type 2Morbid obesity  Renal/GU CRFRenal disease     Musculoskeletal   Abdominal   Peds  Hematology  (+) anemia ,   Anesthesia Other Findings   Reproductive/Obstetrics                            Lab Results  Component Value Date   WBC 7.1 09/14/2016   HGB 12.3 (L) 09/14/2016   HCT 36.8 (L) 09/14/2016   MCV 89.1 09/14/2016   PLT 188 09/14/2016   Lab Results  Component Value Date   CREATININE 1.21 09/14/2016   BUN 14 09/14/2016   NA 136 09/14/2016   K 4.2 09/14/2016   CL 102 09/14/2016   CO2 23 09/14/2016    Anesthesia Physical Anesthesia  Plan  ASA: IV  Anesthesia Plan: General   Post-op Pain Management:    Induction: Intravenous  Airway Management Planned: Oral ETT  Additional Equipment: Arterial line, TEE, CVP, PA Cath and Ultrasound Guidance Line Placement  Intra-op Plan:   Post-operative Plan: Post-operative intubation/ventilation  Informed Consent: I have reviewed the patients History and Physical, chart, labs and discussed the procedure including the risks, benefits and alternatives for the proposed anesthesia with the patient or authorized representative who has indicated his/her understanding and acceptance.   Dental advisory given  Plan Discussed with: CRNA  Anesthesia Plan Comments:        Anesthesia Quick Evaluation

## 2016-09-14 NOTE — Progress Notes (Signed)
Progress Note    Charles [name]  [id number] DOB: [birth year]  DOA: 09/07/2016 PCP: [name], MD    Brief Narrative:   Chief complaint: F/U chest pain/ACS and atrial fibrillation   Charles [name] is an 70 y.o. male with a PMHx of CAD of native artery status post multiple stents, NSTEMI on 07/28/16, due to in stent restenosis of distal LAD and L circumflex stents which were treated with balloon angioplasty only (no re-stenting). (Other numerous stents were patent), chronic Systolic and Diastolic CHF, PAF (paroxysmal atrial fibrillation), SSS (sick sinus syndrome Pacer present ([employer] Zephyr XL DR 5826, dual chamber), LBBB (left bundle branch block), HTN, HLD, Diabetes Type 2 uncontrolled with Complications, OSA on CPAP, and Nephrolithiasis  who was admitted 09/07/16 for evaluation of chest pain.  Assessment/Plan:   Principal Problem:   NSTEMI (non-ST elevated myocardial infarction) ([place]) in a patient with known coronary artery disease presenting with unstable angina  Records reviewed. Troponins elevated. Pt has had multiple stent procedures and now has in-stent restenosis.   Cardiac cath 09/08/16 showed severe restenosis circumflex and OM stents, diagonal stent and in the mid LAD stent. He had been on Plavix and this is now on hold. CABG planned for September 15, 2017 allowing for Plavix washout. Continue IV heparin, Imdur, ASA, statin, IV NTG. LDL 53 on 08/17/16.   Active Problems:   Constipation Give Sorbitol.    Uncontrolled type 2 diabetes mellitus with complication (HCC) Hemoglobin A1c done 08/03/16:7.1%. Currently being managed with U 500, 25 units twice a day moderate scale SSI. CBGs 169-193.    Sick sinus syndrome ([place]) Has a pacemaker in situ.    OSA on CPAP Continue CPAP per respiratory.    Chronic pain syndrome Continue current pain management regimen.    Cardiomyopathy, ischemic / Chronic combined systolic and diastolic CHF (congestive heart failure)  (HCC) LVEF 35-40%. On Lisinopril 20 mg daily, sotalol. HCTZ resumed 09/13/16, I/O balance -500 mL, weight down 0.8 kg.   Family Communication/Anticipated D/C date and plan/Code Status   DVT prophylaxis: Heparin ordered. Code Status: Full Code.  Family Communication: Wife at the bedside. Disposition Plan: CABG planned for 09/15/16.   Medical Consultants:    Cardiology  CVTS   Procedures:    Cardiac cath 09/08/16: Rapid redevelopment of in-stent restenosis in each of the 3 sites treated in September.  PFTs 09/09/16  Carotid dopplers 09/10/16  LE dopplers with saphenous vien mapping 09/10/16  Anti-Infectives:    None  Subjective:    No recurrent chest pain.  ROS negative for nausea/vomiting/diaphoresis.  Objective:    Vitals:   09/13/16 0606 09/13/16 1512 09/13/16 2213 09/14/16 0541  BP:  130/73 128/79 133/77  Pulse:  74 70 74  Resp:      Temp:  97.6 F (36.4 C) 97.8 F (36.6 C) 97.4 F (36.3 C)  TempSrc:  Oral Oral Oral  SpO2:  100% 99% 98%  Weight: 131.3 kg (289 lb 6.4 oz)   130.5 kg (287 lb 12.8 oz)  Height:        Intake/Output Summary (Last 24 hours) at 09/14/16 0734 Last data filed at 09/14/16 0543  Gross per 24 hour  Intake                0 ml  Output              500 ml  Net             -500 ml  Filed Weights   09/12/16 0605 09/13/16 0606 09/14/16 0541  Weight: 130.3 kg (287 lb 3.2 oz) 131.3 kg (289 lb 6.4 oz) 130.5 kg (287 lb 12.8 oz)    Exam: General exam: Appears calm and comfortable.  Respiratory system: Clear to auscultation. Respiratory effort normal. Cardiovascular system: S1 & S2 heard, RRR. No JVD,  rubs, gallops or clicks. No murmurs. Gastrointestinal system: Abdomen is softly distended, soft and nontender. No organomegaly or masses felt. Normal bowel sounds heard. Central nervous system: Alert and oriented. No focal neurological deficits. Extremities: No clubbing,  or cyanosis. 2+ edema. Skin: No rashes, lesions or  ulcers. Psychiatry: Judgement and insight appear normal. Mood & affect appropriate.   Data Reviewed:   I have personally reviewed following labs and imaging studies:  Labs: Basic Metabolic Panel:  Recent Labs Lab 09/10/16 0612 09/12/16 0426  NA 136 137  K 4.1 3.9  CL 107 104  CO2 22 25  GLUCOSE 179* 153*  BUN 13 15  CREATININE 1.09 1.24  CALCIUM 8.6* 9.1  MG  --  2.0   GFR Estimated Creatinine Clearance: 75.2 mL/min (by C-G formula based on SCr of 1.24 mg/dL). Liver Function Tests: No results for input(s): AST, ALT, ALKPHOS, BILITOT, PROT, ALBUMIN in the last 168 hours. Coagulation profile  Recent Labs Lab 09/08/16 1003  INR 1.15    CBC:  Recent Labs Lab 09/10/16 0926 09/11/16 1008 09/12/16 0426 09/13/16 0338 09/14/16 0321  WBC 6.3 6.9 6.8 7.2 7.1  HGB 12.4* 12.9* 12.1* 12.1* 12.3*  HCT 37.5* 37.9* 36.9* 37.2* 36.8*  MCV 88.4 89.6 89.1 89.9 89.1  PLT 198 175 189 178 188   Cardiac Enzymes:  Recent Labs Lab 09/07/16 1444 09/07/16 2002  TROPONINI 0.62* 0.52*   BNP (last 3 results)  Recent Labs  10/21/15 0932  PROBNP 364.0*   CBG:  Recent Labs Lab 09/12/16 2100 09/13/16 0738 09/13/16 1111 09/13/16 1623 09/13/16 2209  GLUCAP 173* 180* 193* 181* 169*    Microbiology Recent Results (from the past 240 hour(s))  MRSA PCR Screening     Status: None   Collection Time: 09/07/16  7:22 AM  Result Value Ref Range Status   MRSA by PCR NEGATIVE NEGATIVE Final    Comment:        The GeneXpert MRSA Assay (FDA approved for NASAL specimens only), is one component of a comprehensive MRSA colonization surveillance program. It is not intended to diagnose MRSA infection nor to guide or monitor treatment for MRSA infections.   Surgical PCR screen     Status: None   Collection Time: 09/08/16 10:13 PM  Result Value Ref Range Status   MRSA, PCR NEGATIVE NEGATIVE Final   Staphylococcus aureus NEGATIVE NEGATIVE Final    Comment:        The Xpert  SA Assay (FDA approved for NASAL specimens in patients over 51 years of age), is one component of a comprehensive surveillance program.  Test performance has been validated by [facility] for patients greater than or equal to 39 year old. It is not intended to diagnose infection nor to guide or monitor treatment.     Radiology: No results found.  Medications:   . alfuzosin  10 mg Oral Q breakfast  . aspirin  81 mg Oral Daily  . carvedilol  3.125 mg Oral BID WC  . dicyclomine  10-20 mg Oral TID AC & HS  . hydrochlorothiazide  25 mg Oral Daily  . insulin aspart  0-15 Units Subcutaneous TID  WC  . insulin regular human CONCENTRATED  25 Units Subcutaneous BID WC  . isosorbide mononitrate  60 mg Oral Daily  . lisinopril  20 mg Oral Daily  . pantoprazole  40 mg Oral q morning - 10a  . rosuvastatin  5 mg Oral Q M,W,F  . sodium chloride flush  3 mL Intravenous Q12H  . sotalol  120 mg Oral BID   Continuous Infusions: . heparin 1,800 Units/hr (09/11/16 0706)    Medical decision making is of Moderate complexity therefore this is a level II visit.      LOS: 7 days   [facility] Pager [phone]. If unable to reach me by pager, please call my cell phone at [phone].  *Please refer to [email or web address], password [email or web address] to get updated schedule on who will round on this patient, as hospitalists switch teams weekly. If 7PM-7AM, please contact night-coverage at [email or web address], password [email or web address] for any overnight needs.  09/14/2016, 7:34 AM

## 2016-09-15 ENCOUNTER — Inpatient Hospital Stay (HOSPITAL_COMMUNITY): Payer: BLUE CROSS/BLUE SHIELD

## 2016-09-15 ENCOUNTER — Inpatient Hospital Stay (HOSPITAL_COMMUNITY): Payer: BLUE CROSS/BLUE SHIELD | Admitting: Anesthesiology

## 2016-09-15 ENCOUNTER — Encounter (HOSPITAL_COMMUNITY)
Admission: EM | Disposition: A | Discharge status: Home / Self Care | Payer: Self-pay | Source: Home / Self Care | Attending: Cardiothoracic Surgery

## 2016-09-15 DIAGNOSIS — Z951 Presence of aortocoronary bypass graft: Secondary | ICD-10-CM

## 2016-09-15 HISTORY — PX: TEE WITHOUT CARDIOVERSION: SHX5443

## 2016-09-15 HISTORY — PX: CORONARY ARTERY BYPASS GRAFT: SHX141

## 2016-09-15 HISTORY — DX: Presence of aortocoronary bypass graft: Z95.1

## 2016-09-15 LAB — POCT I-STAT 3, ART BLOOD GAS (G3+)
ACID-BASE DEFICIT: 2 mmol/L (ref 0.0–2.0)
ACID-BASE DEFICIT: 5 mmol/L — AB (ref 0.0–2.0)
Acid-Base Excess: 2 mmol/L (ref 0.0–2.0)
Acid-Base Excess: 3 mmol/L — ABNORMAL HIGH (ref 0.0–2.0)
Acid-base deficit: 1 mmol/L (ref 0.0–2.0)
Acid-base deficit: 2 mmol/L (ref 0.0–2.0)
BICARBONATE: 25.8 mmol/L (ref 20.0–28.0)
BICARBONATE: 28.4 mmol/L — AB (ref 20.0–28.0)
BICARBONATE: 21 mmol/L (ref 20.0–28.0)
BICARBONATE: 24.1 mmol/L (ref 20.0–28.0)
BICARBONATE: 22.8 mmol/L (ref 20.0–28.0)
Bicarbonate: 27 mmol/L (ref 20.0–28.0)
Bicarbonate: 23.3 mmol/L (ref 20.0–28.0)
O2 SAT: 100 %
O2 SAT: 100 %
O2 SAT: 99 %
O2 SAT: 93 %
O2 Saturation: 100 %
O2 Saturation: 98 %
O2 Saturation: 95 %
PCO2 ART: 46.8 mmHg (ref 32.0–48.0)
PCO2 ART: 42.1 mmHg (ref 32.0–48.0)
PH ART: 7.349 — AB (ref 7.350–7.450)
PH ART: 7.31 — AB (ref 7.350–7.450)
PH ART: 7.385 (ref 7.350–7.450)
PH ART: 7.389 (ref 7.350–7.450)
PO2 ART: 280 mmHg — AB (ref 83.0–108.0)
PO2 ART: 69 mmHg — AB (ref 83.0–108.0)
Patient temperature: 35.6
Patient temperature: 36.6
TCO2: 27 mmol/L (ref 0–100)
TCO2: 28 mmol/L (ref 0–100)
TCO2: 30 mmol/L (ref 0–100)
TCO2: 25 mmol/L (ref 0–100)
TCO2: 22 mmol/L (ref 0–100)
TCO2: 25 mmol/L (ref 0–100)
TCO2: 24 mmol/L (ref 0–100)
pCO2 arterial: 41.8 mmHg (ref 32.0–48.0)
pCO2 arterial: 47.4 mmHg (ref 32.0–48.0)
pCO2 arterial: 41.1 mmHg (ref 32.0–48.0)
pCO2 arterial: 40.1 mmHg (ref 32.0–48.0)
pCO2 arterial: 37.5 mmHg (ref 32.0–48.0)
pH, Arterial: 7.386 (ref 7.350–7.450)
pH, Arterial: 7.418 (ref 7.350–7.450)
pH, Arterial: 7.351 (ref 7.350–7.450)
pO2, Arterial: 393 mmHg — ABNORMAL HIGH (ref 83.0–108.0)
pO2, Arterial: 420 mmHg — ABNORMAL HIGH (ref 83.0–108.0)
pO2, Arterial: 149 mmHg — ABNORMAL HIGH (ref 83.0–108.0)
pO2, Arterial: 98 mmHg (ref 83.0–108.0)
pO2, Arterial: 75 mmHg — ABNORMAL LOW (ref 83.0–108.0)

## 2016-09-15 LAB — CBC
HCT: 32.5 % — ABNORMAL LOW (ref 39.0–52.0)
HEMATOCRIT: 36.7 % — AB (ref 39.0–52.0)
HEMATOCRIT: 34 % — AB (ref 39.0–52.0)
Hemoglobin: 12.2 g/dL — ABNORMAL LOW (ref 13.0–17.0)
Hemoglobin: 11.3 g/dL — ABNORMAL LOW (ref 13.0–17.0)
Hemoglobin: 11.2 g/dL — ABNORMAL LOW (ref 13.0–17.0)
MCH: 29.7 pg (ref 26.0–34.0)
MCH: 29.7 pg (ref 26.0–34.0)
MCH: 29.9 pg (ref 26.0–34.0)
MCHC: 33.2 g/dL (ref 30.0–36.0)
MCHC: 33.2 g/dL (ref 30.0–36.0)
MCHC: 34.5 g/dL (ref 30.0–36.0)
MCV: 89.3 fL (ref 78.0–100.0)
MCV: 89.5 fL (ref 78.0–100.0)
MCV: 86.7 fL (ref 78.0–100.0)
PLATELETS: 187 10*3/uL (ref 150–400)
PLATELETS: 138 10*3/uL — AB (ref 150–400)
Platelets: 149 10*3/uL — ABNORMAL LOW (ref 150–400)
RBC: 4.11 MIL/uL — ABNORMAL LOW (ref 4.22–5.81)
RBC: 3.8 MIL/uL — ABNORMAL LOW (ref 4.22–5.81)
RBC: 3.75 MIL/uL — ABNORMAL LOW (ref 4.22–5.81)
RDW: 14.4 % (ref 11.5–15.5)
RDW: 14.6 % (ref 11.5–15.5)
RDW: 14 % (ref 11.5–15.5)
WBC: 7.8 10*3/uL (ref 4.0–10.5)
WBC: 11.3 10*3/uL — AB (ref 4.0–10.5)
WBC: 11.9 10*3/uL — ABNORMAL HIGH (ref 4.0–10.5)

## 2016-09-15 LAB — POCT I-STAT, CHEM 8
BUN: 16 mg/dL (ref 6–20)
BUN: 15 mg/dL (ref 6–20)
BUN: 14 mg/dL (ref 6–20)
BUN: 14 mg/dL (ref 6–20)
BUN: 14 mg/dL (ref 6–20)
BUN: 13 mg/dL (ref 6–20)
CALCIUM ION: 1.21 mmol/L (ref 1.15–1.40)
CALCIUM ION: 1.09 mmol/L — AB (ref 1.15–1.40)
CALCIUM ION: 1.08 mmol/L — AB (ref 1.15–1.40)
CALCIUM ION: 1.09 mmol/L — AB (ref 1.15–1.40)
CHLORIDE: 103 mmol/L (ref 101–111)
CHLORIDE: 103 mmol/L (ref 101–111)
CHLORIDE: 101 mmol/L (ref 101–111)
CHLORIDE: 99 mmol/L — AB (ref 101–111)
CHLORIDE: 102 mmol/L (ref 101–111)
CREATININE: 0.9 mg/dL (ref 0.61–1.24)
Calcium, Ion: 1.23 mmol/L (ref 1.15–1.40)
Calcium, Ion: 1.2 mmol/L (ref 1.15–1.40)
Chloride: 99 mmol/L — ABNORMAL LOW (ref 101–111)
Creatinine, Ser: 1 mg/dL (ref 0.61–1.24)
Creatinine, Ser: 1 mg/dL (ref 0.61–1.24)
Creatinine, Ser: 0.9 mg/dL (ref 0.61–1.24)
Creatinine, Ser: 1 mg/dL (ref 0.61–1.24)
Creatinine, Ser: 1.1 mg/dL (ref 0.61–1.24)
GLUCOSE: 169 mg/dL — AB (ref 65–99)
GLUCOSE: 160 mg/dL — AB (ref 65–99)
GLUCOSE: 150 mg/dL — AB (ref 65–99)
GLUCOSE: 168 mg/dL — AB (ref 65–99)
GLUCOSE: 134 mg/dL — AB (ref 65–99)
Glucose, Bld: 213 mg/dL — ABNORMAL HIGH (ref 65–99)
HCT: 33 % — ABNORMAL LOW (ref 39.0–52.0)
HCT: 29 % — ABNORMAL LOW (ref 39.0–52.0)
HCT: 28 % — ABNORMAL LOW (ref 39.0–52.0)
HCT: 28 % — ABNORMAL LOW (ref 39.0–52.0)
HEMATOCRIT: 34 % — AB (ref 39.0–52.0)
HEMATOCRIT: 33 % — AB (ref 39.0–52.0)
HEMOGLOBIN: 11.2 g/dL — AB (ref 13.0–17.0)
Hemoglobin: 11.6 g/dL — ABNORMAL LOW (ref 13.0–17.0)
Hemoglobin: 11.2 g/dL — ABNORMAL LOW (ref 13.0–17.0)
Hemoglobin: 9.9 g/dL — ABNORMAL LOW (ref 13.0–17.0)
Hemoglobin: 9.5 g/dL — ABNORMAL LOW (ref 13.0–17.0)
Hemoglobin: 9.5 g/dL — ABNORMAL LOW (ref 13.0–17.0)
POTASSIUM: 4.4 mmol/L (ref 3.5–5.1)
POTASSIUM: 4.2 mmol/L (ref 3.5–5.1)
POTASSIUM: 4.1 mmol/L (ref 3.5–5.1)
Potassium: 4.2 mmol/L (ref 3.5–5.1)
Potassium: 4.6 mmol/L (ref 3.5–5.1)
Potassium: 3.8 mmol/L (ref 3.5–5.1)
SODIUM: 138 mmol/L (ref 135–145)
SODIUM: 135 mmol/L (ref 135–145)
Sodium: 137 mmol/L (ref 135–145)
Sodium: 138 mmol/L (ref 135–145)
Sodium: 133 mmol/L — ABNORMAL LOW (ref 135–145)
Sodium: 138 mmol/L (ref 135–145)
TCO2: 27 mmol/L (ref 0–100)
TCO2: 24 mmol/L (ref 0–100)
TCO2: 26 mmol/L (ref 0–100)
TCO2: 25 mmol/L (ref 0–100)
TCO2: 23 mmol/L (ref 0–100)
TCO2: 23 mmol/L (ref 0–100)

## 2016-09-15 LAB — GLUCOSE, CAPILLARY
GLUCOSE-CAPILLARY: 151 mg/dL — AB (ref 65–99)
GLUCOSE-CAPILLARY: 131 mg/dL — AB (ref 65–99)
GLUCOSE-CAPILLARY: 126 mg/dL — AB (ref 65–99)
Glucose-Capillary: 164 mg/dL — ABNORMAL HIGH (ref 65–99)
Glucose-Capillary: 144 mg/dL — ABNORMAL HIGH (ref 65–99)
Glucose-Capillary: 145 mg/dL — ABNORMAL HIGH (ref 65–99)

## 2016-09-15 LAB — BASIC METABOLIC PANEL
Anion gap: 7 (ref 5–15)
BUN: 15 mg/dL (ref 6–20)
CO2: 27 mmol/L (ref 22–32)
Calcium: 9.4 mg/dL (ref 8.9–10.3)
Chloride: 103 mmol/L (ref 101–111)
Creatinine, Ser: 1.34 mg/dL — ABNORMAL HIGH (ref 0.61–1.24)
GFR calc Af Amer: >60 mL/min (ref >60)
GFR calc non Af Amer: 52 mL/min — ABNORMAL LOW (ref >60)
Glucose, Bld: 151 mg/dL — ABNORMAL HIGH (ref 65–99)
Potassium: 4.7 mmol/L (ref 3.5–5.1)
Sodium: 137 mmol/L (ref 135–145)

## 2016-09-15 LAB — PLATELET COUNT: Platelets: 134 10*3/uL — ABNORMAL LOW (ref 150–400)

## 2016-09-15 LAB — HEMOGLOBIN AND HEMATOCRIT, BLOOD
HCT: 28.7 % — ABNORMAL LOW (ref 39.0–52.0)
Hemoglobin: 9.7 g/dL — ABNORMAL LOW (ref 13.0–17.0)

## 2016-09-15 LAB — POCT I-STAT 4, (NA,K, GLUC, HGB,HCT)
GLUCOSE: 200 mg/dL — AB (ref 65–99)
HCT: 33 % — ABNORMAL LOW (ref 39.0–52.0)
Hemoglobin: 11.2 g/dL — ABNORMAL LOW (ref 13.0–17.0)
POTASSIUM: 4.1 mmol/L (ref 3.5–5.1)
SODIUM: 138 mmol/L (ref 135–145)

## 2016-09-15 LAB — CREATININE, SERUM
Creatinine, Ser: 1.18 mg/dL (ref 0.61–1.24)
GFR calc Af Amer: >60 mL/min (ref >60)
GFR calc non Af Amer: >60 mL/min (ref >60)

## 2016-09-15 LAB — PROTIME-INR
INR: 1.31
PROTHROMBIN TIME: 16.3 s — AB (ref 11.4–15.2)

## 2016-09-15 LAB — APTT: APTT: 34 s (ref 24–36)

## 2016-09-15 LAB — ECHO INTRAOPERATIVE TEE
Height: 69 in
MV Vena cont: 0.37 cm
Weight: 4574.4 oz

## 2016-09-15 LAB — HEMOGLOBIN A1C
Hgb A1c MFr Bld: 6.3 % — ABNORMAL HIGH (ref 4.8–5.6)
Mean Plasma Glucose: 134 mg/dL

## 2016-09-15 LAB — HEPARIN LEVEL (UNFRACTIONATED): HEPARIN UNFRACTIONATED: 0.63 [IU]/mL (ref 0.30–0.70)

## 2016-09-15 LAB — MAGNESIUM: Magnesium: 2.4 mg/dL (ref 1.7–2.4)

## 2016-09-15 SURGERY — CORONARY ARTERY BYPASS GRAFTING (CABG)
Anesthesia: General | Site: Chest

## 2016-09-15 MED ORDER — SUFENTANIL CITRATE 50 MCG/ML IV SOLN
INTRAVENOUS | Status: AC
  Filled 2016-09-15: qty 1

## 2016-09-15 MED ORDER — PROPOFOL 10 MG/ML IV BOLUS
INTRAVENOUS | Status: AC
  Filled 2016-09-15: qty 40

## 2016-09-15 MED ORDER — MILRINONE LACTATE IN DEXTROSE 20-5 MG/100ML-% IV SOLN
0.1250 ug/kg/min | INTRAVENOUS | Status: DC
  Administered 2016-09-15: .3 ug/kg/min via INTRAVENOUS
  Filled 2016-09-15: qty 100

## 2016-09-15 MED ORDER — HEMOSTATIC AGENTS (NO CHARGE) OPTIME
TOPICAL | Status: DC | PRN
  Administered 2016-09-15 (×2): 1 via TOPICAL

## 2016-09-15 MED ORDER — PHENYLEPHRINE HCL 10 MG/ML IJ SOLN
0.0000 ug/min | INTRAMUSCULAR | Status: DC
  Administered 2016-09-16: 60 ug/min via INTRAVENOUS
  Filled 2016-09-15 (×3): qty 2

## 2016-09-15 MED ORDER — HEPARIN SODIUM (PORCINE) 1000 UNIT/ML IJ SOLN
INTRAMUSCULAR | Status: AC
  Filled 2016-09-15: qty 1

## 2016-09-15 MED ORDER — SUCCINYLCHOLINE 20MG/ML (10ML) SYRINGE FOR MEDFUSION PUMP - OPTIME
INTRAMUSCULAR | Status: DC | PRN
  Administered 2016-09-15: 120 mg via INTRAVENOUS

## 2016-09-15 MED ORDER — ACETAMINOPHEN 160 MG/5ML PO SOLN: 650.0000 mg | Freq: Once | ORAL | Status: AC

## 2016-09-15 MED ORDER — HEPARIN SODIUM (PORCINE) 1000 UNIT/ML IJ SOLN
INTRAMUSCULAR | Status: DC | PRN
  Administered 2016-09-15: 48 mL via INTRAVENOUS
  Administered 2016-09-15: 10 mL via INTRAVENOUS

## 2016-09-15 MED ORDER — LIDOCAINE HCL (CARDIAC) 20 MG/ML IV SOLN
INTRAVENOUS | Status: DC | PRN
  Administered 2016-09-15: 100 mg via INTRAVENOUS

## 2016-09-15 MED ORDER — METOCLOPRAMIDE HCL 5 MG/ML IJ SOLN
10.0000 mg | Freq: Three times a day (TID) | INTRAMUSCULAR | Status: AC
  Administered 2016-09-15 – 2016-09-16 (×3): 10 mg via INTRAVENOUS
  Filled 2016-09-15 (×2): qty 2

## 2016-09-15 MED ORDER — ARTIFICIAL TEARS OP OINT
TOPICAL_OINTMENT | OPHTHALMIC | Status: DC | PRN
  Administered 2016-09-15: 1 via OPHTHALMIC

## 2016-09-15 MED ORDER — DEXTROSE 5 % IV SOLN
1.5000 g | Freq: Two times a day (BID) | INTRAVENOUS | Status: AC
  Administered 2016-09-15 – 2016-09-17 (×4): 1.5 g via INTRAVENOUS
  Filled 2016-09-15 (×5): qty 1.5

## 2016-09-15 MED ORDER — SODIUM CHLORIDE 0.9% FLUSH
3.0000 mL | Freq: Two times a day (BID) | INTRAVENOUS | Status: DC
  Administered 2016-09-16 – 2016-09-17 (×4): 3 mL via INTRAVENOUS

## 2016-09-15 MED ORDER — METOPROLOL TARTRATE 12.5 MG HALF TABLET
12.5000 mg | ORAL_TABLET | Freq: Two times a day (BID) | ORAL | Status: DC
  Administered 2016-09-16 – 2016-09-18 (×3): 12.5 mg via ORAL
  Filled 2016-09-15 (×3): qty 1

## 2016-09-15 MED ORDER — DEXTROSE 5 % IV SOLN
1.5000 g | INTRAVENOUS | Status: DC
  Filled 2016-09-15: qty 1.5

## 2016-09-15 MED ORDER — PANTOPRAZOLE SODIUM 40 MG PO TBEC
40.0000 mg | DELAYED_RELEASE_TABLET | Freq: Every day | ORAL | Status: DC
  Administered 2016-09-17 – 2016-09-18 (×2): 40 mg via ORAL
  Filled 2016-09-15 (×2): qty 1

## 2016-09-15 MED ORDER — CHLORHEXIDINE GLUCONATE 0.12 % MT SOLN
15.0000 mL | OROMUCOSAL | Status: AC
  Administered 2016-09-15: 15 mL via OROMUCOSAL

## 2016-09-15 MED ORDER — MIDAZOLAM HCL 10 MG/2ML IJ SOLN
INTRAMUSCULAR | Status: AC
  Filled 2016-09-15: qty 2

## 2016-09-15 MED ORDER — FENTANYL CITRATE (PF) 100 MCG/2ML IJ SOLN
25.0000 ug | INTRAMUSCULAR | Status: DC | PRN
  Administered 2016-09-15 – 2016-09-16 (×3): 25 ug via INTRAVENOUS
  Filled 2016-09-15 (×4): qty 2

## 2016-09-15 MED ORDER — DOPAMINE-DEXTROSE 3.2-5 MG/ML-% IV SOLN
0.0000 ug/kg/min | INTRAVENOUS | Status: DC
  Administered 2016-09-16: 3 ug/kg/min via INTRAVENOUS
  Filled 2016-09-15: qty 250

## 2016-09-15 MED ORDER — VANCOMYCIN HCL IN DEXTROSE 1-5 GM/200ML-% IV SOLN
1000.0000 mg | Freq: Once | INTRAVENOUS | Status: AC
  Administered 2016-09-15: 1000 mg via INTRAVENOUS
  Filled 2016-09-15: qty 200

## 2016-09-15 MED ORDER — ROCURONIUM BROMIDE 10 MG/ML (PF) SYRINGE
PREFILLED_SYRINGE | INTRAVENOUS | Status: AC
  Filled 2016-09-15: qty 20

## 2016-09-15 MED ORDER — SUFENTANIL CITRATE 50 MCG/ML IV SOLN
INTRAVENOUS | Status: DC | PRN
  Administered 2016-09-15: 25 ug via INTRAVENOUS
  Administered 2016-09-15 (×2): 10 ug via INTRAVENOUS
  Administered 2016-09-15 (×2): 35 ug via INTRAVENOUS
  Administered 2016-09-15: 5 ug via INTRAVENOUS
  Administered 2016-09-15: 15 ug via INTRAVENOUS
  Administered 2016-09-15: 5 ug via INTRAVENOUS
  Administered 2016-09-15: 10 ug via INTRAVENOUS

## 2016-09-15 MED ORDER — LACTATED RINGERS IV SOLN
INTRAVENOUS | Status: DC | PRN
  Administered 2016-09-15 (×2): via INTRAVENOUS

## 2016-09-15 MED ORDER — BISACODYL 10 MG RE SUPP: 10.0000 mg | Freq: Every day | RECTAL | Status: DC

## 2016-09-15 MED ORDER — LACTATED RINGERS IV SOLN: 500.0000 mL | Freq: Once | INTRAVENOUS | Status: DC | PRN

## 2016-09-15 MED ORDER — DEXMEDETOMIDINE HCL IN NACL 200 MCG/50ML IV SOLN
0.0000 ug/kg/h | INTRAVENOUS | Status: DC
  Filled 2016-09-15: qty 50

## 2016-09-15 MED ORDER — POTASSIUM CHLORIDE 10 MEQ/50ML IV SOLN: 10.0000 meq | INTRAVENOUS | Status: AC

## 2016-09-15 MED ORDER — SODIUM CHLORIDE 0.9% FLUSH: 3.0000 mL | INTRAVENOUS | Status: DC | PRN

## 2016-09-15 MED ORDER — INSULIN REGULAR BOLUS VIA INFUSION
0.0000 [IU] | Freq: Three times a day (TID) | INTRAVENOUS | Status: DC
  Filled 2016-09-15: qty 10

## 2016-09-15 MED ORDER — ALBUMIN HUMAN 5 % IV SOLN
250.0000 mL | INTRAVENOUS | Status: AC | PRN
  Administered 2016-09-15 – 2016-09-16 (×4): 250 mL via INTRAVENOUS
  Filled 2016-09-15 (×2): qty 250

## 2016-09-15 MED ORDER — TRAMADOL HCL 50 MG PO TABS
50.0000 mg | ORAL_TABLET | ORAL | Status: DC | PRN
  Administered 2016-09-16: 100 mg via ORAL
  Administered 2016-09-17: 50 mg via ORAL
  Filled 2016-09-15: qty 2
  Filled 2016-09-15: qty 1

## 2016-09-15 MED ORDER — PROPOFOL 10 MG/ML IV BOLUS
INTRAVENOUS | Status: DC | PRN
  Administered 2016-09-15: 200 mg via INTRAVENOUS
  Administered 2016-09-15: 50 mg via INTRAVENOUS

## 2016-09-15 MED ORDER — TRANEXAMIC ACID 1000 MG/10ML IV SOLN
1.5000 mg/kg/h | INTRAVENOUS | Status: DC
  Filled 2016-09-15: qty 25

## 2016-09-15 MED ORDER — METOPROLOL TARTRATE 25 MG/10 ML ORAL SUSPENSION
12.5000 mg | Freq: Two times a day (BID) | ORAL | Status: DC
Start: 2016-09-15 — End: 2016-09-18

## 2016-09-15 MED ORDER — METOPROLOL TARTRATE 5 MG/5ML IV SOLN: 2.5000 mg | INTRAVENOUS | Status: DC | PRN

## 2016-09-15 MED ORDER — CHLORHEXIDINE GLUCONATE 0.12% ORAL RINSE (MEDLINE KIT)
15.0000 mL | Freq: Two times a day (BID) | OROMUCOSAL | Status: DC
  Administered 2016-09-15 – 2016-09-19 (×3): 15 mL via OROMUCOSAL

## 2016-09-15 MED ORDER — ROCURONIUM BROMIDE 100 MG/10ML IV SOLN
INTRAVENOUS | Status: DC | PRN
  Administered 2016-09-15: 50 mg via INTRAVENOUS
  Administered 2016-09-15: 40 mg via INTRAVENOUS
  Administered 2016-09-15 (×2): 50 mg via INTRAVENOUS
  Administered 2016-09-15: 80 mg via INTRAVENOUS
  Administered 2016-09-15: 30 mg via INTRAVENOUS

## 2016-09-15 MED ORDER — KETOROLAC TROMETHAMINE 30 MG/ML IJ SOLN
30.0000 mg | Freq: Four times a day (QID) | INTRAMUSCULAR | Status: AC
  Administered 2016-09-15 – 2016-09-16 (×4): 30 mg via INTRAVENOUS
  Filled 2016-09-15 (×4): qty 1

## 2016-09-15 MED ORDER — LIDOCAINE 2% (20 MG/ML) 5 ML SYRINGE
INTRAMUSCULAR | Status: AC
  Filled 2016-09-15: qty 5

## 2016-09-15 MED ORDER — PHENYLEPHRINE HCL 10 MG/ML IJ SOLN
INTRAVENOUS | Status: DC | PRN
  Administered 2016-09-15: 25 ug/min via INTRAVENOUS

## 2016-09-15 MED ORDER — FAMOTIDINE IN NACL 20-0.9 MG/50ML-% IV SOLN
20.0000 mg | Freq: Two times a day (BID) | INTRAVENOUS | Status: DC
  Administered 2016-09-15: 20 mg via INTRAVENOUS

## 2016-09-15 MED ORDER — ALBUMIN HUMAN 5 % IV SOLN
INTRAVENOUS | Status: DC | PRN
  Administered 2016-09-15: 14:00:00 via INTRAVENOUS

## 2016-09-15 MED ORDER — VANCOMYCIN HCL 1000 MG IV SOLR
INTRAVENOUS | Status: DC | PRN
  Administered 2016-09-15: 1500 mg via INTRAVENOUS

## 2016-09-15 MED ORDER — MAGNESIUM SULFATE 4 GM/100ML IV SOLN
4.0000 g | Freq: Once | INTRAVENOUS | Status: AC
  Administered 2016-09-15: 4 g via INTRAVENOUS
  Filled 2016-09-15: qty 100

## 2016-09-15 MED ORDER — NITROGLYCERIN IN D5W 200-5 MCG/ML-% IV SOLN: 0.0000 ug/min | INTRAVENOUS | Status: DC

## 2016-09-15 MED ORDER — LACTATED RINGERS IV SOLN
INTRAVENOUS | Status: DC | PRN
  Administered 2016-09-15: 09:00:00 via INTRAVENOUS

## 2016-09-15 MED ORDER — ACETAMINOPHEN 160 MG/5ML PO SOLN: 1000.0000 mg | Freq: Four times a day (QID) | ORAL | Status: DC

## 2016-09-15 MED ORDER — BISACODYL 5 MG PO TBEC
10.0000 mg | DELAYED_RELEASE_TABLET | Freq: Every day | ORAL | Status: DC
  Administered 2016-09-16 – 2016-09-17 (×2): 10 mg via ORAL
  Filled 2016-09-15 (×3): qty 2

## 2016-09-15 MED ORDER — PROTAMINE SULFATE 10 MG/ML IV SOLN
INTRAVENOUS | Status: DC | PRN
  Administered 2016-09-15: 10 mg via INTRAVENOUS
  Administered 2016-09-15: 90 mg via INTRAVENOUS
  Administered 2016-09-15: 50 mg via INTRAVENOUS
  Administered 2016-09-15 (×2): 100 mg via INTRAVENOUS

## 2016-09-15 MED ORDER — SODIUM CHLORIDE 0.9 % IV SOLN
INTRAVENOUS | Status: DC
  Administered 2016-09-15: 16:00:00 via INTRAVENOUS

## 2016-09-15 MED ORDER — ASPIRIN 81 MG PO CHEW: 324.0000 mg | CHEWABLE_TABLET | Freq: Every day | ORAL | Status: DC

## 2016-09-15 MED ORDER — PROTAMINE SULFATE 10 MG/ML IV SOLN
INTRAVENOUS | Status: AC
  Filled 2016-09-15: qty 10

## 2016-09-15 MED ORDER — MIDAZOLAM HCL 5 MG/5ML IJ SOLN
INTRAMUSCULAR | Status: DC | PRN
  Administered 2016-09-15: 1 mg via INTRAVENOUS
  Administered 2016-09-15: 4 mg via INTRAVENOUS
  Administered 2016-09-15: 3 mg via INTRAVENOUS
  Administered 2016-09-15: 2 mg via INTRAVENOUS

## 2016-09-15 MED ORDER — ACETAMINOPHEN 500 MG PO TABS
1000.0000 mg | ORAL_TABLET | Freq: Four times a day (QID) | ORAL | Status: DC
Start: 2016-09-16 — End: 2016-09-18
  Administered 2016-09-16 – 2016-09-18 (×9): 1000 mg via ORAL
  Filled 2016-09-15 (×9): qty 2

## 2016-09-15 MED ORDER — SODIUM CHLORIDE 0.9 % IV SOLN
INTRAVENOUS | Status: DC | PRN
  Administered 2016-09-15: .2 ug/kg/h via INTRAVENOUS

## 2016-09-15 MED ORDER — SODIUM CHLORIDE 0.9 % IJ SOLN
OROMUCOSAL | Status: DC | PRN
  Administered 2016-09-15 (×3): 4 mL via TOPICAL

## 2016-09-15 MED ORDER — MILRINONE LACTATE IN DEXTROSE 20-5 MG/100ML-% IV SOLN
0.3000 ug/kg/min | INTRAVENOUS | Status: DC
  Administered 2016-09-15 – 2016-09-16 (×2): 0.3 ug/kg/min via INTRAVENOUS
  Filled 2016-09-15 (×2): qty 100

## 2016-09-15 MED ORDER — ASPIRIN EC 325 MG PO TBEC
325.0000 mg | DELAYED_RELEASE_TABLET | Freq: Every day | ORAL | Status: DC
  Administered 2016-09-16 – 2016-09-18 (×3): 325 mg via ORAL
  Filled 2016-09-15 (×3): qty 1

## 2016-09-15 MED ORDER — ONDANSETRON HCL 4 MG/2ML IJ SOLN
4.0000 mg | Freq: Four times a day (QID) | INTRAMUSCULAR | Status: DC | PRN
  Administered 2016-09-16 (×3): 4 mg via INTRAVENOUS
  Filled 2016-09-15 (×3): qty 2

## 2016-09-15 MED ORDER — DEXTROSE 5 % IV SOLN
INTRAVENOUS | Status: DC | PRN
  Administered 2016-09-15: .75 g via INTRAVENOUS
  Administered 2016-09-15: 1.5 g via INTRAVENOUS

## 2016-09-15 MED ORDER — DICYCLOMINE HCL 10 MG PO CAPS
10.0000 mg | ORAL_CAPSULE | Freq: Three times a day (TID) | ORAL | Status: DC
  Administered 2016-09-16 – 2016-09-21 (×7): 10 mg via ORAL
  Filled 2016-09-15 (×26): qty 1

## 2016-09-15 MED ORDER — MIDAZOLAM HCL 2 MG/2ML IJ SOLN: 2.0000 mg | INTRAMUSCULAR | Status: DC | PRN

## 2016-09-15 MED ORDER — SODIUM CHLORIDE 0.9 % IV SOLN
INTRAVENOUS | Status: DC
  Filled 2016-09-15: qty 2.5

## 2016-09-15 MED ORDER — PHENYLEPHRINE HCL 10 MG/ML IJ SOLN
INTRAMUSCULAR | Status: DC | PRN
Start: 2016-09-15 — End: 2016-09-15
  Administered 2016-09-15: 40 ug via INTRAVENOUS
  Administered 2016-09-15: 80 ug via INTRAVENOUS
  Administered 2016-09-15: 40 ug via INTRAVENOUS

## 2016-09-15 MED ORDER — ACETAMINOPHEN 650 MG RE SUPP
650.0000 mg | Freq: Once | RECTAL | Status: AC
  Administered 2016-09-15: 650 mg via RECTAL

## 2016-09-15 MED ORDER — SODIUM CHLORIDE 0.9 % IV SOLN: 250.0000 mL | INTRAVENOUS | Status: DC

## 2016-09-15 MED ORDER — SODIUM CHLORIDE 0.9 % IJ SOLN
INTRAMUSCULAR | Status: AC
  Filled 2016-09-15: qty 20

## 2016-09-15 MED ORDER — LACTATED RINGERS IV SOLN
INTRAVENOUS | Status: DC
  Administered 2016-09-15: 16:00:00 via INTRAVENOUS

## 2016-09-15 MED ORDER — DOCUSATE SODIUM 100 MG PO CAPS
200.0000 mg | ORAL_CAPSULE | Freq: Every day | ORAL | Status: DC
  Administered 2016-09-16 – 2016-09-17 (×2): 200 mg via ORAL
  Filled 2016-09-15 (×3): qty 2

## 2016-09-15 MED ORDER — DEXMEDETOMIDINE HCL IN NACL 400 MCG/100ML IV SOLN
0.1000 ug/kg/h | INTRAVENOUS | Status: DC
  Filled 2016-09-15: qty 100

## 2016-09-15 MED ORDER — 0.9 % SODIUM CHLORIDE (POUR BTL) OPTIME
TOPICAL | Status: DC | PRN
  Administered 2016-09-15: 6000 mL

## 2016-09-15 MED ORDER — SUFENTANIL CITRATE 50 MCG/ML IV SOLN
INTRAVENOUS | Status: AC
Start: 2016-09-15 — End: 2016-09-15
  Filled 2016-09-15: qty 2

## 2016-09-15 MED ORDER — LACTATED RINGERS IV SOLN
INTRAVENOUS | Status: DC | PRN
  Administered 2016-09-15 (×2): via INTRAVENOUS

## 2016-09-15 MED ORDER — ORAL CARE MOUTH RINSE
15.0000 mL | Freq: Four times a day (QID) | OROMUCOSAL | Status: DC
  Administered 2016-09-16 – 2016-09-18 (×8): 15 mL via OROMUCOSAL

## 2016-09-15 MED ORDER — ROCURONIUM BROMIDE 10 MG/ML (PF) SYRINGE
PREFILLED_SYRINGE | INTRAVENOUS | Status: AC
  Filled 2016-09-15: qty 10

## 2016-09-15 MED ORDER — SODIUM CHLORIDE 0.45 % IV SOLN: INTRAVENOUS | Status: DC | PRN

## 2016-09-15 MED ORDER — SODIUM CHLORIDE 0.9 % IV SOLN
INTRAVENOUS | Status: DC | PRN
  Administered 2016-09-15: 2.2 [IU]/h via INTRAVENOUS

## 2016-09-15 MED ORDER — PROTAMINE SULFATE 10 MG/ML IV SOLN
INTRAVENOUS | Status: AC
  Filled 2016-09-15: qty 25

## 2016-09-15 MED FILL — Heparin Sodium (Porcine) Inj 1000 Unit/ML: INTRAMUSCULAR | Qty: 30 | Status: AC

## 2016-09-15 MED FILL — Magnesium Sulfate Inj 50%: INTRAMUSCULAR | Qty: 10 | Status: AC

## 2016-09-15 MED FILL — Potassium Chloride Inj 2 mEq/ML: INTRAVENOUS | Qty: 40 | Status: AC

## 2016-09-15 SURGICAL SUPPLY — 67 items
BAG DECANTER FOR FLEXI CONT (MISCELLANEOUS) ×3 IMPLANT
BANDAGE ACE 4X5 VEL STRL LF (GAUZE/BANDAGES/DRESSINGS) ×3 IMPLANT
BANDAGE ACE 6X5 VEL STRL LF (GAUZE/BANDAGES/DRESSINGS) ×3 IMPLANT
BATTERY MAXDRIVER (MISCELLANEOUS) ×3 IMPLANT
BLADE STERNUM SYSTEM 6 (BLADE) ×3 IMPLANT
BNDG GAUZE ELAST 4 BULKY (GAUZE/BANDAGES/DRESSINGS) ×3 IMPLANT
CANISTER SUCTION 2500CC (MISCELLANEOUS) ×3 IMPLANT
CANNULA ARTERIAL NVNT 3/8 22FR (MISCELLANEOUS) ×3 IMPLANT
CATH CPB KIT GERHARDT (MISCELLANEOUS) ×3 IMPLANT
CATH THORACIC 28FR (CATHETERS) ×3 IMPLANT
CRADLE DONUT ADULT HEAD (MISCELLANEOUS) ×3 IMPLANT
DRAIN CHANNEL 28F RND 3/8 FF (WOUND CARE) ×3 IMPLANT
DRAPE CARDIOVASCULAR INCISE (DRAPES) ×1
DRAPE SLUSH/WARMER DISC (DRAPES) ×3 IMPLANT
DRAPE SRG 135X102X78XABS (DRAPES) ×2 IMPLANT
DRSG AQUACEL AG ADV 3.5X14 (GAUZE/BANDAGES/DRESSINGS) ×3 IMPLANT
ELECT BLADE 4.0 EZ CLEAN MEGAD (MISCELLANEOUS) ×3
ELECT REM PT RETURN 9FT ADLT (ELECTROSURGICAL) ×6
ELECTRODE BLDE 4.0 EZ CLN MEGD (MISCELLANEOUS) ×2 IMPLANT
ELECTRODE REM PT RTRN 9FT ADLT (ELECTROSURGICAL) ×4 IMPLANT
FELT TEFLON 1X6 (MISCELLANEOUS) ×6 IMPLANT
GAUZE SPONGE 4X4 12PLY STRL (GAUZE/BANDAGES/DRESSINGS) ×6 IMPLANT
GLOVE BIO SURGEON STRL SZ 6.5 (GLOVE) ×27 IMPLANT
GOWN STRL REUS W/ TWL LRG LVL3 (GOWN DISPOSABLE) ×18 IMPLANT
GOWN STRL REUS W/TWL LRG LVL3 (GOWN DISPOSABLE) ×9
HEMOSTAT POWDER SURGIFOAM 1G (HEMOSTASIS) ×9 IMPLANT
HEMOSTAT SURGICEL 2X14 (HEMOSTASIS) ×3 IMPLANT
KIT BASIN OR (CUSTOM PROCEDURE TRAY) ×3 IMPLANT
KIT CATH SUCT 8FR (CATHETERS) ×3 IMPLANT
KIT ROOM TURNOVER OR (KITS) ×3 IMPLANT
KIT SUCTION CATH 14FR (SUCTIONS) ×6 IMPLANT
KIT VASOVIEW HEMOPRO VH 3000 (KITS) ×3 IMPLANT
LEAD PACING MYOCARDI (MISCELLANEOUS) ×3 IMPLANT
MARKER GRAFT CORONARY BYPASS (MISCELLANEOUS) ×9 IMPLANT
NS IRRIG 1000ML POUR BTL (IV SOLUTION) ×18 IMPLANT
PACK OPEN HEART (CUSTOM PROCEDURE TRAY) ×3 IMPLANT
PAD ARMBOARD 7.5X6 YLW CONV (MISCELLANEOUS) ×6 IMPLANT
PAD ELECT DEFIB RADIOL ZOLL (MISCELLANEOUS) ×3 IMPLANT
PENCIL BUTTON HOLSTER BLD 10FT (ELECTRODE) ×3 IMPLANT
PLATE STERNAL 2.3X208 14H 2-PK (Plate) ×3 IMPLANT
PUNCH AORTIC ROTATE  4.5MM 8IN (MISCELLANEOUS) ×3 IMPLANT
SCREW STERNAL 2.3X17MM (Screw) ×24 IMPLANT
SET CARDIOPLEGIA MPS 5001102 (MISCELLANEOUS) ×3 IMPLANT
SPONGE GAUZE 4X4 12PLY STER LF (GAUZE/BANDAGES/DRESSINGS) ×6 IMPLANT
SUT BONE WAX W31G (SUTURE) ×3 IMPLANT
SUT PROLENE 3 0 SH1 36 (SUTURE) ×3 IMPLANT
SUT PROLENE 4 0 TF (SUTURE) ×6 IMPLANT
SUT PROLENE 6 0 C 1 30 (SUTURE) ×6 IMPLANT
SUT PROLENE 6 0 CC (SUTURE) ×15 IMPLANT
SUT PROLENE 7 0 BV1 MDA (SUTURE) ×9 IMPLANT
SUT PROLENE 8 0 BV175 6 (SUTURE) ×18 IMPLANT
SUT STEEL 6MS V (SUTURE) ×3 IMPLANT
SUT STEEL SZ 6 DBL 3X14 BALL (SUTURE) ×3 IMPLANT
SUT VIC AB 1 CTX 18 (SUTURE) ×6 IMPLANT
SUT VIC AB 2-0 CT1 27 (SUTURE) ×1
SUT VIC AB 2-0 CT1 TAPERPNT 27 (SUTURE) ×2 IMPLANT
SUT VIC AB 3-0 X1 27 (SUTURE) ×3 IMPLANT
SUTURE E-PAK OPEN HEART (SUTURE) ×3 IMPLANT
SYSTEM SAHARA CHEST DRAIN ATS (WOUND CARE) ×3 IMPLANT
TAPE CLOTH SURG 4X10 WHT LF (GAUZE/BANDAGES/DRESSINGS) ×3 IMPLANT
TAPE PAPER 2X10 WHT MICROPORE (GAUZE/BANDAGES/DRESSINGS) ×3 IMPLANT
TOWEL OR 17X24 6PK STRL BLUE (TOWEL DISPOSABLE) ×6 IMPLANT
TOWEL OR 17X26 10 PK STRL BLUE (TOWEL DISPOSABLE) ×6 IMPLANT
TRAY FOLEY IC TEMP SENS 16FR (CATHETERS) ×3 IMPLANT
TUBING INSUFFLATION (TUBING) ×3 IMPLANT
UNDERPAD 30X30 (UNDERPADS AND DIAPERS) ×3 IMPLANT
WATER STERILE IRR 1000ML POUR (IV SOLUTION) ×6 IMPLANT

## 2016-09-15 NOTE — Transfer of Care (Signed)
Immediate Anesthesia Transfer of Care Note  Patient: Charles [name]  Procedure(s) Performed: Procedure(s): CORONARY ARTERY BYPASS GRAFTING (CABG) x four, using left internal mammary artery and right leg greater saphenous vein harvested endscopically (N/A) TRANSESOPHAGEAL ECHOCARDIOGRAM (TEE) (N/A)  Patient Location: SICU  Anesthesia Type:General  Level of Consciousness: sedated and Patient remains intubated per anesthesia plan  Airway & Oxygen Therapy: Patient remains intubated per anesthesia plan and Patient placed on Ventilator (see vital sign flow sheet for setting)  Post-op Assessment: Report given to RN and Post -op Vital signs reviewed and stable  Post vital signs: Reviewed and stable  Last Vitals:  Vitals:   09/14/16 2045 09/15/16 0558  BP: 126/72 128/75  Pulse: 69 68  Resp: 16 16  Temp: 36.5 C 36.5 C    Last Pain:  Vitals:   09/15/16 0558  TempSrc: Oral  PainSc:       Patients Stated Pain Goal: 0 ([id number])  Complications: No apparent anesthesia complications

## 2016-09-15 NOTE — Progress Notes (Signed)
Patient ID: Charles [name], male   DOB: 04-18-1946, 70 y.o.   MRN: [id number] EVENING ROUNDS NOTE :     [address]       [place] [address]             [phone]                 Day of Surgery Procedure(s) (LRB): CORONARY ARTERY BYPASS GRAFTING (CABG) x four, using left internal mammary artery and right leg greater saphenous vein harvested endscopically (N/A) TRANSESOPHAGEAL ECHOCARDIOGRAM (TEE) (N/A)  Total Length of Stay:  LOS: 8 days  BP (!) 88/49   Pulse 72   Temp 97.9 F (36.6 C) (Core (Comment))   Resp 16   Ht 5\' 9"  (1.753 m)   Wt 285 lb 14.4 oz (129.7 kg)   SpO2 98%   BMI 42.22 kg/m   .Intake/Output      10/25 0701 - 10/26 0700   P.O.    I.V. (mL/kg) 2717.8 (21)   Blood 756   NG/GT 30   IV Piggyback 1200   Total Intake(mL/kg) 4703.8 (36.3)   Urine (mL/kg/hr) 2510 (1.4)   Chest Tube 200 (0.1)   Total Output 2710   Net +1993.8         . sodium chloride    . [START ON 09/16/2016] sodium chloride    . sodium chloride 20 mL/hr at 09/15/16 1900  . dexmedetomidine 0.1 mcg/kg/hr (09/15/16 1900)  . DOPamine 3 mcg/kg/min (09/15/16 1900)  . insulin (NOVOLIN-R) infusion 6.4 Units/hr (09/15/16 2003)  . lactated ringers 20 mL/hr at 09/15/16 1900  . lactated ringers Stopped (09/15/16 1900)  . milrinone 0.3 mcg/kg/min (09/15/16 1900)  . nitroGLYCERIN Stopped (09/15/16 1600)  . phenylephrine (NEO-SYNEPHRINE) Adult infusion 0 mcg/min (09/15/16 1800)     Lab Results  Component Value Date   WBC 11.3 (H) 09/15/2016   HGB 11.3 (L) 09/15/2016   HCT 34.0 (L) 09/15/2016   PLT 138 (L) 09/15/2016   GLUCOSE 200 (H) 09/15/2016   CHOL 122 (L) 08/17/2016   TRIG 133 08/17/2016   HDL 42 08/17/2016   LDLCALC 53 08/17/2016   ALT 64 (H) 09/14/2016   AST 39 09/14/2016   NA 138 09/15/2016   K 4.1 09/15/2016   CL 99 (L) 09/15/2016   CREATININE 1.00 09/15/2016   BUN 14 09/15/2016   CO2 27 09/15/2016   TSH 1.951 07/29/2016   INR 1.31 09/15/2016   HGBA1C  6.3 (H) 09/14/2016   Extubated, alert No results found for this or any previous visit. intact  Charles Isaac MD  Beeper [phone] Office [phone] 09/15/2016 8:40 PM

## 2016-09-15 NOTE — Progress Notes (Signed)
Pt appears to be in and out of AFIB w/ frequent ectopy. Dr. Servando Snare at bedside and aware. Will continue to monitor closely. Eleonore Chiquito RN

## 2016-09-15 NOTE — OR Nursing (Signed)
14:55 - 20 minute call to SICU charge nurse 

## 2016-09-15 NOTE — OR Nursing (Signed)
14:15 - 29minute call to SICU charge nurse Altha Harm

## 2016-09-15 NOTE — Anesthesia Postprocedure Evaluation (Signed)
Anesthesia Post Note  Patient: Charles [name]  Procedure(s) Performed: Procedure(s) (LRB): CORONARY ARTERY BYPASS GRAFTING (CABG) x four, using left internal mammary artery and right leg greater saphenous vein harvested endscopically (N/A) TRANSESOPHAGEAL ECHOCARDIOGRAM (TEE) (N/A)  Patient location during evaluation: SICU Anesthesia Type: General Level of consciousness: sedated Pain management: pain level controlled Vital Signs Assessment: post-procedure vital signs reviewed and stable Respiratory status: patient remains intubated per anesthesia plan Cardiovascular status: stable Anesthetic complications: no    Last Vitals:  Vitals:   09/15/16 1715 09/15/16 1730  BP: (!) 99/52   Pulse: 79 78  Resp: 16 17  Temp: 36.2 C 36.4 C    Last Pain:  Vitals:   09/15/16 1715  TempSrc: Core (Comment)  PainSc:                  Tiajuana Amass

## 2016-09-15 NOTE — Brief Op Note (Addendum)
09/07/2016 - 09/15/2016      [address]       [place] [address]             [phone]     09/07/2016 - 09/15/2016  1:01 PM  PATIENT:  Charles [name]  70 y.o. male  PRE-OPERATIVE DIAGNOSIS:  CAD  POST-OPERATIVE DIAGNOSIS:  CAD  PROCEDURE:  Procedure(s): CORONARY ARTERY BYPASS GRAFTING (CABG) x four, using left internal mammary artery and right leg greater saphenous vein harvested endscopically([facility]) TRANSESOPHAGEAL ECHOCARDIOGRAM (TEE) [place]ima-lad svg-om svg-pd svg-diag Plating left side of sternum   SURGEON:  Surgeon(s): Grace Isaac, MD129  PHYSICIAN ASSISTANT: Jadene Pierini PA-C  ANESTHESIA:   general  PATIENT CONDITION:  ICU - intubated and hemodynamically stable.  PRE-OPERATIVE WEIGHT: Q000111Q kg  Complications: no known

## 2016-09-15 NOTE — Anesthesia Procedure Notes (Signed)
Procedure Name: Intubation Date/Time: 09/15/2016 8:56 AM Performed by: Trixie Deis A Pre-anesthesia Checklist: Patient identified, Emergency Drugs available, Suction available and Patient being monitored Patient Re-evaluated:Patient Re-evaluated prior to inductionOxygen Delivery Method: Circle System Utilized Preoxygenation: Pre-oxygenation with 100% oxygen Intubation Type: IV induction Ventilation: Mask ventilation without difficulty and Oral airway inserted - appropriate to patient size Laryngoscope Size: Glidescope and 4 Grade View: Grade I Tube type: Oral Tube size: 8.0 mm Number of attempts: 1 Airway Equipment and Method: Oral airway,  Rigid stylet and Video-laryngoscopy Placement Confirmation: ETT inserted through vocal cords under direct vision,  positive ETCO2 and breath sounds checked- equal and bilateral Secured at: 23 cm Tube secured with: Tape Dental Injury: Teeth and Oropharynx as per pre-operative assessment

## 2016-09-15 NOTE — Progress Notes (Signed)
Went to go examine and see the patient, however he had been taken down for his CABG with possible Atrial Clip Placement.

## 2016-09-16 ENCOUNTER — Inpatient Hospital Stay (HOSPITAL_COMMUNITY): Payer: BLUE CROSS/BLUE SHIELD

## 2016-09-16 ENCOUNTER — Encounter (HOSPITAL_COMMUNITY): Payer: Self-pay | Admitting: Cardiothoracic Surgery

## 2016-09-16 LAB — GLUCOSE, CAPILLARY
GLUCOSE-CAPILLARY: 104 mg/dL — AB (ref 65–99)
GLUCOSE-CAPILLARY: 113 mg/dL — AB (ref 65–99)
GLUCOSE-CAPILLARY: 114 mg/dL — AB (ref 65–99)
GLUCOSE-CAPILLARY: 115 mg/dL — AB (ref 65–99)
GLUCOSE-CAPILLARY: 118 mg/dL — AB (ref 65–99)
GLUCOSE-CAPILLARY: 120 mg/dL — AB (ref 65–99)
GLUCOSE-CAPILLARY: 122 mg/dL — AB (ref 65–99)
GLUCOSE-CAPILLARY: 127 mg/dL — AB (ref 65–99)
GLUCOSE-CAPILLARY: 213 mg/dL — AB (ref 65–99)
GLUCOSE-CAPILLARY: 236 mg/dL — AB (ref 65–99)
Glucose-Capillary: 125 mg/dL — ABNORMAL HIGH (ref 65–99)
Glucose-Capillary: 119 mg/dL — ABNORMAL HIGH (ref 65–99)
Glucose-Capillary: 116 mg/dL — ABNORMAL HIGH (ref 65–99)
Glucose-Capillary: 129 mg/dL — ABNORMAL HIGH (ref 65–99)
Glucose-Capillary: 125 mg/dL — ABNORMAL HIGH (ref 65–99)
Glucose-Capillary: 116 mg/dL — ABNORMAL HIGH (ref 65–99)
Glucose-Capillary: 117 mg/dL — ABNORMAL HIGH (ref 65–99)
Glucose-Capillary: 114 mg/dL — ABNORMAL HIGH (ref 65–99)

## 2016-09-16 LAB — BASIC METABOLIC PANEL
ANION GAP: 8 (ref 5–15)
BUN: 11 mg/dL (ref 6–20)
CALCIUM: 8.5 mg/dL — AB (ref 8.9–10.3)
CO2: 23 mmol/L (ref 22–32)
CREATININE: 1.19 mg/dL (ref 0.61–1.24)
Chloride: 106 mmol/L (ref 101–111)
GFR calc Af Amer: >60 mL/min (ref >60)
GLUCOSE: 119 mg/dL — AB (ref 65–99)
POTASSIUM: 4 mmol/L (ref 3.5–5.1)
SODIUM: 137 mmol/L (ref 135–145)

## 2016-09-16 LAB — CBC
HCT: 30.7 % — ABNORMAL LOW (ref 39.0–52.0)
HCT: 32.8 % — ABNORMAL LOW (ref 39.0–52.0)
HEMOGLOBIN: 10.4 g/dL — AB (ref 13.0–17.0)
Hemoglobin: 10.9 g/dL — ABNORMAL LOW (ref 13.0–17.0)
MCH: 30 pg (ref 26.0–34.0)
MCH: 29.5 pg (ref 26.0–34.0)
MCHC: 33.9 g/dL (ref 30.0–36.0)
MCHC: 33.2 g/dL (ref 30.0–36.0)
MCV: 88.5 fL (ref 78.0–100.0)
MCV: 88.6 fL (ref 78.0–100.0)
Platelets: 144 10*3/uL — ABNORMAL LOW (ref 150–400)
Platelets: 148 10*3/uL — ABNORMAL LOW (ref 150–400)
RBC: 3.47 MIL/uL — AB (ref 4.22–5.81)
RBC: 3.7 MIL/uL — ABNORMAL LOW (ref 4.22–5.81)
RDW: 14.4 % (ref 11.5–15.5)
RDW: 14.6 % (ref 11.5–15.5)
WBC: 12 10*3/uL — AB (ref 4.0–10.5)
WBC: 12.3 10*3/uL — ABNORMAL HIGH (ref 4.0–10.5)

## 2016-09-16 LAB — POCT I-STAT, CHEM 8
BUN: 17 mg/dL (ref 6–20)
CALCIUM ION: 1.16 mmol/L (ref 1.15–1.40)
CHLORIDE: 102 mmol/L (ref 101–111)
CREATININE: 1.4 mg/dL — AB (ref 0.61–1.24)
GLUCOSE: 197 mg/dL — AB (ref 65–99)
HCT: 31 % — ABNORMAL LOW (ref 39.0–52.0)
Hemoglobin: 10.5 g/dL — ABNORMAL LOW (ref 13.0–17.0)
Potassium: 4.5 mmol/L (ref 3.5–5.1)
Sodium: 137 mmol/L (ref 135–145)
TCO2: 22 mmol/L (ref 0–100)

## 2016-09-16 LAB — MAGNESIUM
Magnesium: 2.2 mg/dL (ref 1.7–2.4)
Magnesium: 2.3 mg/dL (ref 1.7–2.4)

## 2016-09-16 LAB — CREATININE, SERUM
CREATININE: 1.52 mg/dL — AB (ref 0.61–1.24)
GFR calc Af Amer: 52 mL/min — ABNORMAL LOW (ref >60)
GFR calc non Af Amer: 45 mL/min — ABNORMAL LOW (ref >60)

## 2016-09-16 MED ORDER — INSULIN GLARGINE 100 UNIT/ML ~~LOC~~ SOLN
30.0000 [IU] | Freq: Every day | SUBCUTANEOUS | Status: DC
  Administered 2016-09-16 – 2016-09-18 (×3): 30 [IU] via SUBCUTANEOUS
  Filled 2016-09-16 (×4): qty 0.3

## 2016-09-16 MED ORDER — CHLORHEXIDINE GLUCONATE 0.12 % MT SOLN
OROMUCOSAL | Status: AC
  Filled 2016-09-16: qty 15

## 2016-09-16 MED ORDER — FUROSEMIDE 10 MG/ML IJ SOLN
40.0000 mg | Freq: Once | INTRAMUSCULAR | Status: AC
  Administered 2016-09-16: 40 mg via INTRAVENOUS

## 2016-09-16 MED ORDER — FUROSEMIDE 10 MG/ML IJ SOLN
INTRAMUSCULAR | Status: AC
  Administered 2016-09-16: 40 mg via INTRAVENOUS
  Filled 2016-09-16: qty 4

## 2016-09-16 MED ORDER — INSULIN ASPART 100 UNIT/ML ~~LOC~~ SOLN
0.0000 [IU] | SUBCUTANEOUS | Status: DC
  Administered 2016-09-16 (×2): 8 [IU] via SUBCUTANEOUS
  Administered 2016-09-17 (×2): 4 [IU] via SUBCUTANEOUS
  Administered 2016-09-17: 12 [IU] via SUBCUTANEOUS
  Administered 2016-09-17: 8 [IU] via SUBCUTANEOUS
  Administered 2016-09-17: 4 [IU] via SUBCUTANEOUS
  Administered 2016-09-18: 2 [IU] via SUBCUTANEOUS
  Administered 2016-09-18: 4 [IU] via SUBCUTANEOUS
  Administered 2016-09-18: 2 [IU] via SUBCUTANEOUS

## 2016-09-16 MED FILL — Mannitol IV Soln 20%: INTRAVENOUS | Qty: 500 | Status: AC

## 2016-09-16 MED FILL — Lidocaine HCl IV Inj 20 MG/ML: INTRAVENOUS | Qty: 5 | Status: AC

## 2016-09-16 MED FILL — Heparin Sodium (Porcine) Inj 1000 Unit/ML: INTRAMUSCULAR | Qty: 20 | Status: AC

## 2016-09-16 MED FILL — Sodium Bicarbonate IV Soln 8.4%: INTRAVENOUS | Qty: 50 | Status: AC

## 2016-09-16 MED FILL — Electrolyte-R (PH 7.4) Solution: INTRAVENOUS | Qty: 4000 | Status: AC

## 2016-09-16 NOTE — Progress Notes (Addendum)
T[facility] DAILY ICU PROGRESS NOTE                   [address]            ,[place] [address]          [phone]   1 Day Post-Op Procedure(s) (LRB): CORONARY ARTERY BYPASS GRAFTING (CABG) x four, using left internal mammary artery and right leg greater saphenous vein harvested endscopically (N/A) TRANSESOPHAGEAL ECHOCARDIOGRAM (TEE) (N/A)  Total Length of Stay:  LOS: 9 days   Subjective: Some nausea, feels anxious as well  Objective: Vital signs in last 24 hours: Temp:  [95.9 F (35.5 C)-99.1 F (37.3 C)] 99 F (37.2 C) (10/26 0700) Pulse Rate:  [46-90] 83 (10/26 0700) Cardiac Rhythm: Atrial paced (10/26 0430) Resp:  [12-20] 19 (10/26 0700) BP: (79-129)/(38-67) 99/52 (10/26 0600) SpO2:  [91 %-99 %] 95 % (10/26 0700) Arterial Line BP: (94-156)/(45-67) 142/62 (10/26 0700) FiO2 (%):  [40 %-50 %] 40 % (10/25 1900) Weight:  [299 lb 9.7 oz (135.9 kg)] 299 lb 9.7 oz (135.9 kg) (10/26 0500)  Filed Weights   09/14/16 0541 09/15/16 0558 09/16/16 0500  Weight: 287 lb 12.8 oz (130.5 kg) 285 lb 14.4 oz (129.7 kg) 299 lb 9.7 oz (135.9 kg)    Weight change: 13 lb 11.3 oz (6.217 kg)   Hemodynamic parameters for last 24 hours: PAP: (21-49)/(7-27) 42/21 CO:  [5 L/min-6.9 L/min] 6.4 L/min CI:  [2.1 L/min/m2-2.9 L/min/m2] 2.7 L/min/m2  Intake/Output from previous day: 10/25 0701 - 10/26 0700 In: 6212.1 [I.V.:3496.1; Blood:756; NG/GT:60; IV Piggyback:1900] Out: 2585 [Urine:3485; Chest Tube:440]  Intake/Output this shift: No intake/output data recorded.  Current Meds: Scheduled Meds: . acetaminophen  1,000 mg Oral Q6H   Or  . acetaminophen (TYLENOL) oral liquid 160 mg/5 mL  1,000 mg Per Tube Q6H  . alfuzosin  10 mg Oral Q breakfast  . aspirin EC  325 mg Oral Daily   Or  . aspirin  324 mg Per Tube Daily  . bisacodyl  10 mg Oral Daily   Or  . bisacodyl  10 mg Rectal Daily  . cefUROXime (ZINACEF)  IV  1.5 g Intravenous Q12H  . chlorhexidine gluconate (MEDLINE  KIT)  15 mL Mouth Rinse BID  . dicyclomine  10 mg Oral TID AC & HS  . docusate sodium  200 mg Oral Daily  . famotidine (PEPCID) IV  20 mg Intravenous Q12H  . insulin regular  0-10 Units Intravenous TID WC  . ketorolac  30 mg Intravenous Q6H  . mouth rinse  15 mL Mouth Rinse QID  . metoprolol tartrate  12.5 mg Oral BID   Or  . metoprolol tartrate  12.5 mg Per Tube BID  . [START ON 09/17/2016] pantoprazole  40 mg Oral Daily  . rosuvastatin  5 mg Oral Q M,W,F  . sodium chloride flush  3 mL Intravenous Q12H   Continuous Infusions: . sodium chloride    . sodium chloride    . sodium chloride 20 mL/hr at 09/15/16 2000  . dexmedetomidine Stopped (09/15/16 2030)  . DOPamine 3.002 mcg/kg/min (09/15/16 2000)  . insulin (NOVOLIN-R) infusion 5.8 Units/hr (09/16/16 0734)  . lactated ringers 20 mL/hr at 09/15/16 2000  . lactated ringers Stopped (09/15/16 1900)  . milrinone 0.3 mcg/kg/min (09/16/16 0118)  . nitroGLYCERIN Stopped (09/15/16 1600)  . phenylephrine (NEO-SYNEPHRINE) Adult infusion 35 mcg/min (09/16/16 0743)   PRN Meds:.sodium chloride, fentaNYL (SUBLIMAZE) injection, lactated ringers, metoprolol, midazolam, ondansetron (ZOFRAN) IV,  sodium chloride flush, traMADol  General appearance: alert, cooperative and no distress Heart: irregularly irregular rhythm Lungs: clear anteriorly Abdomen: soft, non-tender Extremities: no edema Wound: dressings CDI  Lab Results: CBC: Recent Labs  09/15/16 2139 09/15/16 2140 09/16/16 0413  WBC 11.9*  --  12.0*  HGB 11.2* 11.2* 10.4*  HCT 32.5* 33.0* 30.7*  PLT 149*  --  144*   BMET:  Recent Labs  09/15/16 0500  09/15/16 2140 09/16/16 0413  NA 137  < > 138 137  K 4.7  < > 4.1 4.0  CL 103  < > 102 106  CO2 27  --   --  23  GLUCOSE 151*  < > 134* 119*  BUN 15  < > 13 11  CREATININE 1.34*  < > 1.10 1.19  CALCIUM 9.4  --   --  8.5*  < > = values in this interval not displayed.  CMET: Lab Results  Component Value Date   WBC 12.0 (H)  09/16/2016   HGB 10.4 (L) 09/16/2016   HCT 30.7 (L) 09/16/2016   PLT 144 (L) 09/16/2016   GLUCOSE 119 (H) 09/16/2016   CHOL 122 (L) 08/17/2016   TRIG 133 08/17/2016   HDL 42 08/17/2016   LDLCALC 53 08/17/2016   ALT 64 (H) 09/14/2016   AST 39 09/14/2016   NA 137 09/16/2016   K 4.0 09/16/2016   CL 106 09/16/2016   CREATININE 1.19 09/16/2016   BUN 11 09/16/2016   CO2 23 09/16/2016   TSH 1.951 07/29/2016   INR 1.31 09/15/2016   HGBA1C 6.3 (H) 09/14/2016    PT/INR:  Recent Labs  09/15/16 1608  LABPROT 16.3*  INR 1.31   Radiology: Dg Chest Port 1 View  Result Date: 09/16/2016 CLINICAL DATA:  Chest tube.  CABG. EXAM: PORTABLE CHEST 1 VIEW COMPARISON:  09/15/2016 FINDINGS: Interim extubation and removal of NG tube. Left chest tube in stable position. Swan-Ganz catheter stable position . Cardiac pacer stable position. Prior CABG. Cardiomegaly with pulmonary vascular prominence. Persistent left lower lobe atelectasis kidney Small left pleural effusion. IMPRESSION: 1. Interim extubation and removal of NG tube. Remaining lines and tubes including left chest tube in stable position. No pneumothorax. 2. Cardiac pacer stable position. Prior CABG. Ardiomegaly with mild pulmonary venous congestion. 3.  Persistent left lower lobe atelectasis. Electronically Signed   By: Marcello Moores  Register   On: 09/16/2016 07:40   Dg Chest Port 1 View  Result Date: 09/15/2016 CLINICAL DATA:  Post CABG EXAM: PORTABLE CHEST 1 VIEW COMPARISON:  09/14/2016 FINDINGS: Left pacer remains in place, unchanged. Changes of CABG. Left chest tube in place without pneumothorax. Endotracheal tube 5 cm above the carina. Swan-Ganz catheter tip in the main pulmonary artery. NG tube in stomach. Mild cardiomegaly with vascular congestion. Left perihilar and lower lobe atelectasis. Very low lung volumes. IMPRESSION: Postoperative changes. Very low lung volumes with left perihilar and lower lobe atelectasis. No pneumothorax.  Electronically Signed   By: Rolm Baptise M.D.   On: 09/15/2016 15:57     Assessment/Plan: S/P Procedure(s) (LRB): CORONARY ARTERY BYPASS GRAFTING (CABG) x four, using left internal mammary artery and right leg greater saphenous vein harvested endscopically (N/A) TRANSESOPHAGEAL ECHOCARDIOGRAM (TEE) (N/A)  1 doing well 2 pacer interrogation today 3 CI is >2, wean milrinone off and if index ok will d/c swan 4 leave CT's for now 5 ABL anemia- stable/monitor 6 diurese for volume overload when gtts off  Charles [name],Charles [name] 09/16/2016 8:18 AM   Transition off  insulin drip Expected Acute  Blood - loss Anemia I have seen and examined Charles [name] and agree with the above assessment  and plan.  Grace Isaac MD Beeper [phone] Office [phone] 09/16/2016 2:10 PM

## 2016-09-16 NOTE — Progress Notes (Signed)
  [facility] BRIEF SICU PROGRESS NOTE  1 Day Post-Op  S/P Procedure(s) (LRB): CORONARY ARTERY BYPASS GRAFTING (CABG) x four, using left internal mammary artery and right leg greater saphenous vein harvested endscopically (N/A) TRANSESOPHAGEAL ECHOCARDIOGRAM (TEE) (N/A)   Stable day NSR w/ stable BP off all drips Breathing comfortably on room air Diuresing fairly well Labs okay although creatinine up to 1.4   Plan: Continue current plan  Rexene Alberts, MD 09/16/2016 6:14 PM

## 2016-09-17 ENCOUNTER — Inpatient Hospital Stay (HOSPITAL_COMMUNITY): Payer: BLUE CROSS/BLUE SHIELD

## 2016-09-17 DIAGNOSIS — I495 Sick sinus syndrome: Secondary | ICD-10-CM

## 2016-09-17 LAB — CBC
HCT: 31.7 % — ABNORMAL LOW (ref 39.0–52.0)
Hemoglobin: 10.4 g/dL — ABNORMAL LOW (ref 13.0–17.0)
MCH: 30.1 pg (ref 26.0–34.0)
MCHC: 32.8 g/dL (ref 30.0–36.0)
MCV: 91.6 fL (ref 78.0–100.0)
Platelets: 130 10*3/uL — ABNORMAL LOW (ref 150–400)
RBC: 3.46 MIL/uL — ABNORMAL LOW (ref 4.22–5.81)
RDW: 15 % (ref 11.5–15.5)
WBC: 10.7 10*3/uL — ABNORMAL HIGH (ref 4.0–10.5)

## 2016-09-17 LAB — GLUCOSE, CAPILLARY
GLUCOSE-CAPILLARY: 167 mg/dL — AB (ref 65–99)
GLUCOSE-CAPILLARY: 170 mg/dL — AB (ref 65–99)
GLUCOSE-CAPILLARY: 209 mg/dL — AB (ref 65–99)
Glucose-Capillary: 225 mg/dL — ABNORMAL HIGH (ref 65–99)
Glucose-Capillary: 278 mg/dL — ABNORMAL HIGH (ref 65–99)
Glucose-Capillary: 173 mg/dL — ABNORMAL HIGH (ref 65–99)

## 2016-09-17 LAB — BASIC METABOLIC PANEL
Anion gap: 10 (ref 5–15)
BUN: 17 mg/dL (ref 6–20)
CO2: 24 mmol/L (ref 22–32)
Calcium: 8.7 mg/dL — ABNORMAL LOW (ref 8.9–10.3)
Chloride: 102 mmol/L (ref 101–111)
Creatinine, Ser: 1.39 mg/dL — ABNORMAL HIGH (ref 0.61–1.24)
GFR calc Af Amer: 58 mL/min — ABNORMAL LOW (ref >60)
GFR calc non Af Amer: 50 mL/min — ABNORMAL LOW (ref >60)
Glucose, Bld: 200 mg/dL — ABNORMAL HIGH (ref 65–99)
Potassium: 4.6 mmol/L (ref 3.5–5.1)
Sodium: 136 mmol/L (ref 135–145)

## 2016-09-17 MED ORDER — FUROSEMIDE 10 MG/ML IJ SOLN
40.0000 mg | Freq: Once | INTRAMUSCULAR | Status: AC
  Administered 2016-09-17: 40 mg via INTRAVENOUS
  Filled 2016-09-17: qty 4

## 2016-09-17 MED ORDER — SOTALOL HCL 80 MG PO TABS
120.0000 mg | ORAL_TABLET | Freq: Two times a day (BID) | ORAL | Status: DC
  Administered 2016-09-17 – 2016-09-21 (×8): 120 mg via ORAL
  Filled 2016-09-17 (×3): qty 2
  Filled 2016-09-17: qty 1
  Filled 2016-09-17 (×3): qty 2
  Filled 2016-09-17 (×2): qty 1

## 2016-09-17 MED ORDER — CLOPIDOGREL BISULFATE 75 MG PO TABS
75.0000 mg | ORAL_TABLET | Freq: Every day | ORAL | Status: DC
  Administered 2016-09-17 – 2016-09-19 (×3): 75 mg via ORAL
  Filled 2016-09-17 (×3): qty 1

## 2016-09-17 MED ORDER — ENOXAPARIN SODIUM 30 MG/0.3ML ~~LOC~~ SOLN
30.0000 mg | SUBCUTANEOUS | Status: DC
  Administered 2016-09-17 – 2016-09-18 (×2): 30 mg via SUBCUTANEOUS
  Filled 2016-09-17 (×3): qty 0.3

## 2016-09-17 MED FILL — Dexmedetomidine HCl in NaCl 0.9% IV Soln 400 MCG/100ML: INTRAVENOUS | Qty: 100 | Status: AC

## 2016-09-17 NOTE — Progress Notes (Signed)
Patient ID: Charles [name], male   DOB: May 15, 1946, 70 y.o.   MRN: [id number] EVENING ROUNDS NOTE :     [address]       ,[place] [address]             [phone]                 2 Days Post-Op Procedure(s) (LRB): CORONARY ARTERY BYPASS GRAFTING (CABG) x four, using left internal mammary artery and right leg greater saphenous vein harvested endscopically (N/A) TRANSESOPHAGEAL ECHOCARDIOGRAM (TEE) (N/A)  Total Length of Stay:  LOS: 10 days  BP (!) 103/51   Pulse 69   Temp 97.5 F (36.4 C) (Oral)   Resp 18   Ht 5\' 9"  (1.753 m)   Wt 293 lb 3.4 oz (133 kg)   SpO2 100%   BMI 43.30 kg/m   .Intake/Output      10/26 0701 - 10/27 0700 10/27 0701 - 10/28 0700   P.O. 60    I.V. (mL/kg) 1246.3 (9.4)    Blood     NG/GT     IV Piggyback 100    Total Intake(mL/kg) 1406.3 (10.6)    Urine (mL/kg/hr) 1705 (0.5) 1100 (0.7)   Stool 0 (0)    Chest Tube 180 (0.1)    Total Output 1885 1100   Net -478.7 -1100        Stool Occurrence 1 x      . sodium chloride    . sodium chloride    . sodium chloride 20 mL/hr at 09/17/16 0500  . dexmedetomidine Stopped (09/15/16 2030)  . lactated ringers 20 mL/hr at 09/17/16 0500  . lactated ringers Stopped (09/15/16 1900)     Lab Results  Component Value Date   WBC 10.7 (H) 09/17/2016   HGB 10.4 (L) 09/17/2016   HCT 31.7 (L) 09/17/2016   PLT 130 (L) 09/17/2016   GLUCOSE 200 (H) 09/17/2016   CHOL 122 (L) 08/17/2016   TRIG 133 08/17/2016   HDL 42 08/17/2016   LDLCALC 53 08/17/2016   ALT 64 (H) 09/14/2016   AST 39 09/14/2016   NA 136 09/17/2016   K 4.6 09/17/2016   CL 102 09/17/2016   CREATININE 1.39 (H) 09/17/2016   BUN 17 09/17/2016   CO2 24 09/17/2016   TSH 1.951 07/29/2016   INR 1.31 09/15/2016   HGBA1C 6.3 (H) 09/14/2016   Feels better this afternoon, getting ready to go walking  Cr at baseline  Chest tube and foley out  A paced  With frequent pac's  On Plavix and Lovenox now when risk of bleeding  decreased will add xarelto back and stop Lovenox   Grace Isaac MD  Beeper [phone] Office [phone] 09/17/2016 6:09 PM

## 2016-09-17 NOTE — Op Note (Signed)
NAME:  Charles [name], [name] NO.:  [id number]  MEDICAL RECORD NO.:  [id number]  LOCATION:  [id number]                        FACILITY:  [facility]  PHYSICIAN:  Charles Bal, MD    DATE OF BIRTH:  08/09/1946  DATE OF PROCEDURE:  09/15/2016 OPERATIVE REPORT PREOPERATIVE DIAGNOSIS:  Unstable angina with severe three-vessel coronary artery disease and recent failed angioplasty x2. PostOP: Same  PROCEDURE PERFORMED:  Coronary artery bypass grafting x4 with left internal mammary to the left anterior descending coronary artery, reverse saphenous vein graft to the diagonal coronary artery, reverse saphenous vein graft to the obtuse marginal coronary artery, reverse saphenous vein graft to the mid posterior descending coronary artery with right thigh and calf greater saphenous vein harvesting endoscopically, left side sternal plating.  SURGEON:  Charles Bal, MD.  FIRST ASSISTANT:  Charles [name], [place].   BRIEF HISTORY:  The patient is a 70 year old male, who has had known coronary occlusive disease since the mid 1990s.  At that time, he had stents placed by Dr. Rollene [name].  He had done well until recently when he had recurrent pain 1 month prior.  He underwent cardiac catheterization, and had stents placed in his LAD and diagonal 1 month later.  While on Plavix, he presents with recurrent anginal pain with minimal exertion. Repeat cardiac catheterization by Dr. Daneen [name] demonstrated re- stenosis of the stented areas.  In addition, the patient had an 80% stenosis of the mid LAD, 80% stenosis of the circumflex, 80% stenosis of the LAD and 90% stenosis of the diagonal coronary artery.  Because of the patient's symptoms and three-vessel disease and early re-stenosis, coronary artery bypass grafting was recommended.  The patient has a dual- chamber pacemaker in place.  He had a history of paroxysmal atrial fibrillation in the past but records now show him in sinus rhythm.  DESCRIPTION  OF PROCEDURE:  With Swan-Ganz and arterial line monitors in place, the patient underwent general endotracheal anesthesia without incidence.  The skin of the chest and legs was prepped with Betadine and draped in usual sterile manner.  TEE was performed, showed overall preserved LV function.  The patient after surgery was noted to have a very significant left ventricular hypertrophy.  He had trace to mild mitral regurgitation.  After appropriate time-out was performed, we proceeded with endoscopic vein harvesting of the right greater saphenous vein from the thigh and calf.  The vein was of adequate quality and caliber.  Median sternotomy was performed.  Left internal mammary artery was dissected down as a pedicle graft.  The distal artery was divided and had good free flow.  The pericardium was opened.  Overall, ventricular function preserved, but the patient was noted to have very significant left ventricular hypertrophy.  He was systemically heparinized.  He was cannulated with a 22-French aortic cannula because of his large body surface area and weight of 135 kilos.  The right atrium was cannulated taking care to avoid disrupting the atrial lead. Aortic root vent cardioplegia needle was placed into the ascending aorta.  The patient was placed on cardiopulmonary bypass 2.4 L/min/m2. Sites of anastomosis were selected and dissected out of the epicardium. The patient's body temperature was cooled to 32 degrees.  Aortic crossclamp was applied, 500 mL cold blood potassium cardioplegia was administered with diastolic arrest of the heart.  Myocardial septal temperature was monitored throughout the crossclamp.  We turned our attention first to the obtuse marginal coronary artery, which was a large vessel.  This was opened and admitted a 1.5-mm probe easily. Using a running 7-0 Prolene, distal anastomosis was performed with a segment of reverse saphenous vein graft.  The very distal circumflex  branch was been considered for bypass but as it rose from the AV groove was less than 1 mm in size and not felt suitable up to the size to bypass.  We turned our attention to the diagonal coronary artery which had previously been stented.  The vessel was relatively small but opened and admitted a 1-mm probe.  Using a running 7-0 Prolene, distal anastomosis was performed.  The patient's posterior descending rose fairly high on the acute margin of the heart in the midportion.  The vessel was opened just distal to the stenosis.  A 1 mm probe passed distally but not proximally.  Using a running 7-0 Prolene, distal anastomosis was performed with a second reverse saphenous vein graft.  Attention was then turned to the left anterior descending coronary artery in the distal third of the vessel.  The LAD was opened, was relatively poor quality but did pass a 1-mm probe distally.  Using a running 8-0 Prolene, left internal mammary artery was anastomosed to left anterior descending coronary.  With the crossclamp still in place, three punch aortotomies were performed.  Each of the 3 vein grafts were anastomosed to the ascending aorta.  The bulldog on the mammary artery was removed with rise in myocardial septal temperature.  The heart was allowed to passively fill and de-air.  The proximal anastomoses were then completed with a total cross-clamp time of 93 minutes.  Sites of anastomosis were inspected and were free of bleeding.  The patient returned to a paced rhythm with atrial pacing with his internal pacer.  Atrial and ventricular pacing wires were applied, graft marker was applied.  With the body temperature rewarmed to 37 degrees, he was started on milrinone and dopamine infusion.  He was then ventilated and weaned from cardiopulmonary bypass without difficulty.  He remained hemodynamically stable.  He was decannulated in usual fashion.  Protamine sulfate was administered.  Because of a  relatively narrow sternum and concern with his heavy weight especially through the sternum on the left side, a KLM plastic sternal reinforcing bar was then selected and with eight 17-mm screws attached along the left side of the sternum in place of weaving a wire around the sternum and then with the bar seated nicely to reinforce the wires and prevent sternal wire pull through.  A left pleural tube and Blake mediastinal drain were left in place.  Pericardium was loosely reapproximated.  Sternum was closed with #6 stainless steel wire. Fascia was closed with interrupted 0 Vicryl, running 3-0 Vicryl for subcutaneous tissue, 4-0 subcuticular stitch in skin edges.  Dry dressings were applied.  Sponge and needle count was reported as correct at the completion of the procedure.  The patient tolerated the procedure without obvious complication and was transferred to the Surgical Intensive Care Unit.  He did not require any blood bank blood products. Total pump time was 128 minutes.     Charles Bal, MD     EG/MEDQ  D:  09/16/2016  T:  09/17/2016  Job:  [id number]

## 2016-09-17 NOTE — Progress Notes (Signed)
Patient Name: Charles [name] Date of Encounter: 09/17/2016  Primary Cardiologist: [facility] Problem List     Principal Problem:   NSTEMI (non-ST elevated myocardial infarction) [facility]) Active Problems:   Diabetes mellitus type 2 in obese ([place])   Unstable angina ([place])   Coronary artery disease involving native coronary artery of native heart   Uncontrolled type 2 diabetes mellitus with complication (HCC)   Chronic combined systolic and diastolic CHF (congestive heart failure) (HCC)   Sick sinus syndrome (HCC)   OSA on CPAP   Chronic pain syndrome   Cardiomyopathy, ischemic   S/P CABG x 4     Subjective   Complains of anxiety attacks. When this happens he feels he can't breathe.   Inpatient Medications    Scheduled Meds: . acetaminophen  1,000 mg Oral Q6H   Or  . acetaminophen (TYLENOL) oral liquid 160 mg/5 mL  1,000 mg Per Tube Q6H  . alfuzosin  10 mg Oral Q breakfast  . aspirin EC  325 mg Oral Daily   Or  . aspirin  324 mg Per Tube Daily  . bisacodyl  10 mg Oral Daily   Or  . bisacodyl  10 mg Rectal Daily  . chlorhexidine gluconate (MEDLINE KIT)  15 mL Mouth Rinse BID  . clopidogrel  75 mg Oral Q breakfast  . dicyclomine  10 mg Oral TID AC & HS  . docusate sodium  200 mg Oral Daily  . enoxaparin (LOVENOX) injection  30 mg Subcutaneous Q24H  . insulin aspart  0-24 Units Subcutaneous Q4H  . insulin glargine  30 Units Subcutaneous Daily  . mouth rinse  15 mL Mouth Rinse QID  . metoprolol tartrate  12.5 mg Oral BID   Or  . metoprolol tartrate  12.5 mg Per Tube BID  . pantoprazole  40 mg Oral Daily  . rosuvastatin  5 mg Oral Q M,W,F  . sodium chloride flush  3 mL Intravenous Q12H  . sotalol  120 mg Oral BID   Continuous Infusions: . sodium chloride    . sodium chloride    . sodium chloride 20 mL/hr at 09/17/16 0500  . dexmedetomidine Stopped (09/15/16 2030)  . lactated ringers 20 mL/hr at 09/17/16 0500  . lactated ringers Stopped (09/15/16 1900)    PRN Meds: sodium chloride, lactated ringers, metoprolol, midazolam, ondansetron (ZOFRAN) IV, sodium chloride flush, traMADol   Vital Signs    Vitals:   09/17/16 0630 09/17/16 0645 09/17/16 0700 09/17/16 1100  BP:      Pulse: 74 91 77   Resp: 14 (!) 24 14   Temp:   97.5 F (36.4 C) 97.5 F (36.4 C)  TempSrc:   Oral Oral  SpO2: 97% 95% 98%   Weight:      Height:        Intake/Output Summary (Last 24 hours) at 09/17/16 1148 Last data filed at 09/17/16 1100  Gross per 24 hour  Intake          1014.95 ml  Output             2190 ml  Net         -1175.05 ml   Filed Weights   09/15/16 0558 09/16/16 0500 09/17/16 0132  Weight: 285 lb 14.4 oz (129.7 kg) 299 lb 9.7 oz (135.9 kg) 293 lb 3.4 oz (133 kg)    Physical Exam   GEN: Well nourished, obese, in no acute distress.  HEENT: Grossly normal.  Neck: Supple,  no JVD, carotid bruits, or masses. Cardiac: RRR, no murmurs, rubs, or gallops. No clubbing, cyanosis, edema.  Radials/DP/PT 2+ and equal bilaterally.  Respiratory:  Respirations regular and unlabored, clear to auscultation bilaterally. GI: Soft, nontender, nondistended, BS + x 4. MS: no deformity or atrophy. Skin: warm and dry, no rash. Neuro:  Strength and sensation are intact. Psych: AAOx3.  Normal affect.  Labs    CBC  Recent Labs  09/16/16 1650 09/16/16 1710 09/17/16 0420  WBC 12.3*  --  10.7*  HGB 10.9* 10.5* 10.4*  HCT 32.8* 31.0* 31.7*  MCV 88.6  --  91.6  PLT 148*  --  428*   Basic Metabolic Panel  Recent Labs  09/16/16 0413 09/16/16 1650 09/16/16 1710 09/17/16 0420  NA 137  --  137 136  K 4.0  --  4.5 4.6  CL 106  --  102 102  CO2 23  --   --  24  GLUCOSE 119*  --  197* 200*  BUN 11  --  17 17  CREATININE 1.19 1.52* 1.40* 1.39*  CALCIUM 8.5*  --   --  8.7*  MG 2.2 2.3  --   --    Liver Function Tests No results for input(s): AST, ALT, ALKPHOS, BILITOT, PROT, ALBUMIN in the last 72 hours. No results for input(s): LIPASE, AMYLASE in  the last 72 hours. Cardiac Enzymes No results for input(s): CKTOTAL, CKMB, CKMBINDEX, TROPONINI in the last 72 hours. BNP Invalid input(s): POCBNP D-Dimer No results for input(s): DDIMER in the last 72 hours. Hemoglobin A1C No results for input(s): HGBA1C in the last 72 hours. Fasting Lipid Panel No results for input(s): CHOL, HDL, LDLCALC, TRIG, CHOLHDL, LDLDIRECT in the last 72 hours. Thyroid Function Tests No results for input(s): TSH, T4TOTAL, T3FREE, THYROIDAB in the last 72 hours.  Invalid input(s): FREET3  Telemetry    Atrially paced, ventricularly sensed. PACs. One brief run of PACs 11 beats. - Personally Reviewed  ECG    Apaced with LBBB - Personally Reviewed  Radiology    Dg Chest Port 1 View  Result Date: 09/17/2016 CLINICAL DATA:  Status post CABG EXAM: PORTABLE CHEST 1 VIEW COMPARISON:  Yesterday FINDINGS: Swan-Ganz catheter has been removed from the sheath. Dual-chamber pacer leads are in stable position. Left-sided thoracic drain remains. Low volumes with left more than right basilar atelectasis is unchanged. No pneumothorax or edema. Stable cardiopericardial enlargement. IMPRESSION: 1. Remaining hardware in stable position. 2. Unchanged low volume chest with left more than right basilar atelectasis. Electronically Signed   By: Monte Fantasia M.D.   On: 09/17/2016 08:43   Dg Chest Port 1 View  Result Date: 09/16/2016 CLINICAL DATA:  Chest tube.  CABG. EXAM: PORTABLE CHEST 1 VIEW COMPARISON:  09/15/2016 FINDINGS: Interim extubation and removal of NG tube. Left chest tube in stable position. Swan-Ganz catheter stable position . Cardiac pacer stable position. Prior CABG. Cardiomegaly with pulmonary vascular prominence. Persistent left lower lobe atelectasis kidney Small left pleural effusion. IMPRESSION: 1. Interim extubation and removal of NG tube. Remaining lines and tubes including left chest tube in stable position. No pneumothorax. 2. Cardiac pacer stable position.  Prior CABG. Ardiomegaly with mild pulmonary venous congestion. 3.  Persistent left lower lobe atelectasis. Electronically Signed   By: Marcello Moores  Register   On: 09/16/2016 07:40   Dg Chest Port 1 View  Result Date: 09/15/2016 CLINICAL DATA:  Post CABG EXAM: PORTABLE CHEST 1 VIEW COMPARISON:  09/14/2016 FINDINGS: Left pacer remains in place, unchanged.  Changes of CABG. Left chest tube in place without pneumothorax. Endotracheal tube 5 cm above the carina. Swan-Ganz catheter tip in the main pulmonary artery. NG tube in stomach. Mild cardiomegaly with vascular congestion. Left perihilar and lower lobe atelectasis. Very low lung volumes. IMPRESSION: Postoperative changes. Very low lung volumes with left perihilar and lower lobe atelectasis. No pneumothorax. Electronically Signed   By: Rolm Baptise M.D.   On: 09/15/2016 15:57    Cardiac Studies   Pacemaker interrogated today and normal pacer function. Almost completely A paced and V sensed.  Patient Profile     70 year old male with a past medical history of CAD, NSTEMI on 9/6, this was due to in stent restenosis of distal LAD and L circumflex stentswhich were treated with balloon angioplasty only (no re-stenting). Other numerous stents were patent. Chronic Systolic and Diastolic CHF, PAF (paroxysmal atrial fibrillation), SSS (sick sinus syndrome Pacer present(St[employer] Jude Zephyr XL DR 5826, dual chamber), LBBB (left bundle branch block), HTN, HLD, Diabetes Type 2 uncontrolled with Complications, OSA on CPAP, Nephrolithiasis. Had CABG on Wed. 09/15/16 after Plavix wash out.   Assessment & Plan    1. CAD/NSTEMI:Cardiac cath 09/08/16 with severe restenosis circumflex and OM stents, Diagonal stent and in the mid LAD stent. He had been on Plavix and this is now on hold. S/p CABG on Wed. 09/15/16. Continue IV heparin, ASA, statin,BB.   2. Ischemic cardiomyopathy:LVEF 35-40%. On low-dose metoprolol. Titrate as tolerated. Consider ACEi once renal function  at baseline.  3.  PAF: This patients CHA2DS2-VASc = 4Above score calculated as 1 point each if present [CHF, HTN, DM, Vascular=MI/PAD/Aortic Plaque, Age if 65-74, or Male], 2 points each if present [Age >75, or Stroke/TIA/TE] Plan to resume Xarelto when OK with CT surgery.  4. SSS s/p St. Jude PPM: normal pacer check. Atrially paced.  Signed, Izaiyah Kleinman [place], MD  09/17/2016, 11:48 AM

## 2016-09-17 NOTE — Progress Notes (Signed)
Patient ID: Charles [name], male   DOB: [birth year], 70 y.o.   MRN: [id number] TCTS DAILY ICU PROGRESS NOTE                   [address]            [place] [address]          [phone]   2 Days Post-Op Procedure(s) (LRB): CORONARY ARTERY BYPASS GRAFTING (CABG) x four, using left internal mammary artery and right leg greater saphenous vein harvested endscopically (N/A) TRANSESOPHAGEAL ECHOCARDIOGRAM (TEE) (N/A)  Total Length of Stay:  LOS: 10 days   Subjective: Up in chair,some anxiety  Objective: Vital signs in last 24 hours: Temp:  [98.2 F (36.8 C)-99.1 F (37.3 C)] 98.6 F (37 C) (10/27 0359) Pulse Rate:  [27-132] 77 (10/27 0700) Cardiac Rhythm: Ventricular paced (10/27 0400) Resp:  [10-27] 14 (10/27 0700) BP: (87-122)/(18-70) 96/70 (10/27 0415) SpO2:  [86 %-100 %] 98 % (10/27 0700) Arterial Line BP: (91-152)/(49-69) 112/63 (10/27 0645) Weight:  [293 lb 3.4 oz (133 kg)] 293 lb 3.4 oz (133 kg) (10/27 0132)  Filed Weights   09/15/16 0558 09/16/16 0500 09/17/16 0132  Weight: 285 lb 14.4 oz (129.7 kg) 299 lb 9.7 oz (135.9 kg) 293 lb 3.4 oz (133 kg)    Weight change: -6 lb 6.3 oz (-2.9 kg)   Hemodynamic parameters for last 24 hours: PAP: (38-49)/(15-24) 47/19 CO:  [5.4 L/min-7 L/min] 7 L/min CI:  [2.3 L/min/m2-2.9 L/min/m2] 2.9 L/min/m2  Intake/Output from previous day: 10/26 0701 - 10/27 0700 In: 1406.3 [P.O.:60; I.V.:1246.3; IV Piggyback:100] Out: 1885 [Urine:1705; Chest Tube:180]  Intake/Output this shift: No intake/output data recorded.  Current Meds: Scheduled Meds: . acetaminophen  1,000 mg Oral Q6H   Or  . acetaminophen (TYLENOL) oral liquid 160 mg/5 mL  1,000 mg Per Tube Q6H  . alfuzosin  10 mg Oral Q breakfast  . aspirin EC  325 mg Oral Daily   Or  . aspirin  324 mg Per Tube Daily  . bisacodyl  10 mg Oral Daily   Or  . bisacodyl  10 mg Rectal Daily  . chlorhexidine gluconate (MEDLINE KIT)  15 mL Mouth Rinse BID  .  dicyclomine  10 mg Oral TID AC & HS  . docusate sodium  200 mg Oral Daily  . insulin aspart  0-24 Units Subcutaneous Q4H  . insulin glargine  30 Units Subcutaneous Daily  . mouth rinse  15 mL Mouth Rinse QID  . metoprolol tartrate  12.5 mg Oral BID   Or  . metoprolol tartrate  12.5 mg Per Tube BID  . pantoprazole  40 mg Oral Daily  . rosuvastatin  5 mg Oral Q M,W,F  . sodium chloride flush  3 mL Intravenous Q12H   Continuous Infusions: . sodium chloride    . sodium chloride    . sodium chloride 20 mL/hr at 09/17/16 0500  . dexmedetomidine Stopped (09/15/16 2030)  . DOPamine 3 mcg/kg/min (09/17/16 0600)  . lactated ringers 20 mL/hr at 09/17/16 0500  . lactated ringers Stopped (09/15/16 1900)  . milrinone Stopped (09/16/16 1103)  . nitroGLYCERIN Stopped (09/15/16 1600)  . phenylephrine (NEO-SYNEPHRINE) Adult infusion Stopped (09/16/16 1025)   PRN Meds:.sodium chloride, fentaNYL (SUBLIMAZE) injection, lactated ringers, metoprolol, midazolam, ondansetron (ZOFRAN) IV, sodium chloride flush, traMADol  General appearance: alert and cooperative Neurologic: intact Heart: irregularly irregular rhythm Lungs: diminished breath sounds bibasilar Abdomen: soft, non-tender; bowel sounds normal; no masses,  no organomegaly  Extremities: extremities normal, atraumatic, no cyanosis or edema and Homans sign is negative, no sign of DVT Wound: sternum stable  Lab Results: CBC: Recent Labs  09/16/16 1650 09/16/16 1710 09/17/16 0420  WBC 12.3*  --  10.7*  HGB 10.9* 10.5* 10.4*  HCT 32.8* 31.0* 31.7*  PLT 148*  --  130*   BMET:  Recent Labs  09/16/16 0413  09/16/16 1710 09/17/16 0420  NA 137  --  137 136  K 4.0  --  4.5 4.6  CL 106  --  102 102  CO2 23  --   --  24  GLUCOSE 119*  --  197* 200*  BUN 11  --  17 17  CREATININE 1.19  < > 1.40* 1.39*  CALCIUM 8.5*  --   --  8.7*  < > = values in this interval not displayed.  CMET: Lab Results  Component Value Date   WBC 10.7 (H)  09/17/2016   HGB 10.4 (L) 09/17/2016   HCT 31.7 (L) 09/17/2016   PLT 130 (L) 09/17/2016   GLUCOSE 200 (H) 09/17/2016   CHOL 122 (L) 08/17/2016   TRIG 133 08/17/2016   HDL 42 08/17/2016   LDLCALC 53 08/17/2016   ALT 64 (H) 09/14/2016   AST 39 09/14/2016   NA 136 09/17/2016   K 4.6 09/17/2016   CL 102 09/17/2016   CREATININE 1.39 (H) 09/17/2016   BUN 17 09/17/2016   CO2 24 09/17/2016   TSH 1.951 07/29/2016   INR 1.31 09/15/2016   HGBA1C 6.3 (H) 09/14/2016    PT/INR:  Recent Labs  09/15/16 1608  LABPROT 16.3*  INR 1.31   Radiology: No results found.   Assessment/Plan: S/P Procedure(s) (LRB): CORONARY ARTERY BYPASS GRAFTING (CABG) x four, using left internal mammary artery and right leg greater saphenous vein harvested endscopically (N/A) TRANSESOPHAGEAL ECHOCARDIOGRAM (TEE) (N/A) Mobilize Diuresis Diabetes control d/c tubes/lines cardiology follow up pacer check  Wean off dopamine  Severe lv hypertrophy - low term will need ace   Grace Isaac 09/17/2016 7:27 AM

## 2016-09-17 NOTE — Care Management Note (Signed)
Case Management Note Original Note Created by Jacqlyn Krauss 09/13/16  Patient Details  Name: Charles [name] MRN: [id number] Date of Birth: 11/13/1946  Subjective/Objective:   Pt presented for Chest pain, NSTEMI- post cath revealed CAD with 3 vessel disease. Plan for CABG 09-15-16 due to Plavix washout. Pt is from home with wife. Plan to return home once stable.                  Action/Plan: CM did speak with pt in regards to disposition needs. Wife had concerns to [place] and toilet riser. CM did state that both can be found at [facility]. Pt has used them in the past. CM did make pt aware that insurance will only cover a small portion of the motor and rest of the expense to the family. Wife wanted to rent the chair and [facility] does not rent the chairs. CM did call [facility] and they only have medium chairs to rent. [place] rents the lift chairs, however none available to rent until Nov 20th. CM will make family aware. Toilet riser to be purchased from [facility]. CM will continue to monitor post procedure.  Expected Discharge Date:                  Expected Discharge Plan:  [name]  In-House Referral:     Discharge planning Services  CM Consult  Post Acute Care Choice:    Choice offered to:     DME Arranged:    DME Agency:     HH Arranged:    [place] Agency:     Status of Service:  In process, will continue to follow  If discussed at Long Length of Stay Meetings, dates discussed:  09-14-16  Additional Comments: 09/17/2016 CM spoke with pt - pt alert and oriented. Pt states PTA he was independent at home with wife.  Pt states his wife will be with him for recommended supervision at discharge.  CM will continue to follow for discharge needs Maryclare Labrador, RN 09/17/2016, 11:14 AM

## 2016-09-17 NOTE — Discharge Summary (Signed)
Physician Discharge Summary       [address]       ,[place] [address]             [phone]    Patient ID: Charles [name] MRN: [id number] DOB/AGE: 69-Oct-1947 69 y.o.  Admit date: 09/07/2016 Discharge date: 09/21/2016  Admission Diagnoses: 1. Unstable angina ([facility]) 2. NSTEMI (non-ST elevated myocardial infarction) ([facility]) 3. Multivessel CAD  Active Diagnoses:  1. Diabetes mellitus type 2 in obese ([facility]) 2. Chronic combined systolic and diastolic CHF (congestive heart failure) ([place]) 3. Sick sinus syndrome ([place]) 4. OSA on CPAP 5. Chronic pain syndrome 6. Cardiomyopathy, ischemic 7. ABL anemia 8. Thrombocytopenia   Procedure (s):  CORONARY ARTERY BYPASS GRAFTING (CABG) x four (Lima-lad svg-om, svg-pd, svg-diag) using left internal mammary artery and right leg greater saphenous vein harvested endscopically([facility]), TRANSESOPHAGEAL ECHOCARDIOGRAM (TEE), plating left side of sternum   History of Presenting Illness: This is a 70 y.o. male with a past medical history of atrial fibrillation, sick sinus syndrome (s/p PPM placement), DM type 2, hypertension, hyperlipidemia, chronic venous insuffiencey, GERD,and multiple previous myocardial infarctions. He had an angioplasty in 1995 with later re stenosis treated again with angioplasty. In 1998, he had additional stenting of the circumflex and first marginal as well as a stent to the LAD which were bare metal stents. In 2002 he had 2 bare metal stents places in the right coronary artery. In 2008 he had a drug-eluding stent placed in the circumflex. He then developed sick sinus syndrome as well as paroxysmal atrial fibrillation and had a PPM placed. He continues to have episodes of chest pain at home and has taken nitroglycerin which helps initially but the pain returns. He has also had some dyspnea on exertion. His last echocardiogram from September of this year showed no valvular disease but an EF of 45-50%. The cardiac cath  estimated an EF of 35-45%. Dr. Servando Snare was consulted for evaluation of coronary revascularization. At the time of exam, he was  pain free on a nitro drip.  Dr. Servando Snare discussed the need for coronary artery bypass grafting surgery. Potential risks, benefits, and complications were discussed with the patient and he agreed to proceed with surgery. Pre operative carotid duplex US showed no significant internal carotid artery stenosis bilaterally. He underwent a CABG x 4 on 09/15/2016.  Brief Hospital Course:  The patient was extubated the evening of surgery without difficulty. He remained afebrile and hemodynamically stable. He was weaned off of Dopamine and Milrinone drips. Gordy Councilman, a line, and foley were removed early in the post operative course. Chest tubes remained for a few days and then were removed. Lopressor was started and titrated accordingly. He was volume over loaded and diuresed. He had ABL anemia. He did not require a post op transfusion. Last H and H was 10.7 and 33.4. He also had mild thrombocytopenia. This did resolve as his last platelet count was up to 211,000.  He was weaned off the insulin drip. The patient's HGA1C pre op was 6.3.  The patient's glucose remained well controlled. The patient was felt surgically stable for transfer from the ICU to PCTU for further convalescence on 09/18/2016. He continues to progress with cardiac rehab. He was ambulating on room air. He has been tolerating a diet and has had a bowel movement. Epicardial pacing wires were removed on 09/19/2016. Chest tube sutures will be removed the day of discharge. The patient is felt surgically stable for discharge today.  Latest Vital Signs: Blood pressure 125/67, pulse 70, temperature 97.6 F (36.4 C), temperature source Oral, resp. rate 18, height 5\' 9"  (1.753 m), weight 291 lb 8 oz (132.2 kg), SpO2 100 %.  Physical Exam: General appearance: alert and cooperative Neurologic: intact Heart: irregularly  irregular rhythm Lungs: diminished breath sounds bibasilar Abdomen: soft, non-tender; bowel sounds normal; no masses,  no organomegaly Extremities: extremities normal, atraumatic, no cyanosis or edema and Homans sign is negative, no sign of DVT Wound: sternum stable  Discharge Condition:Stable and discharge to home.  Recent laboratory studies:  Lab Results  Component Value Date   WBC 8.5 09/19/2016   HGB 10.7 (L) 09/19/2016   HCT 33.4 (L) 09/19/2016   MCV 90.8 09/19/2016   PLT 211 09/19/2016   Lab Results  Component Value Date   NA 136 09/19/2016   K 4.0 09/19/2016   CL 101 09/19/2016   CO2 26 09/19/2016   CREATININE 1.31 (H) 09/19/2016   GLUCOSE 140 (H) 09/19/2016    Diagnostic Studies:  Dg Chest Port 1 View  Result Date: 09/17/2016 CLINICAL DATA:  Status post CABG EXAM: PORTABLE CHEST 1 VIEW COMPARISON:  Yesterday FINDINGS: Swan-Ganz catheter has been removed from the sheath. Dual-chamber pacer leads are in stable position. Left-sided thoracic drain remains. Low volumes with left more than right basilar atelectasis is unchanged. No pneumothorax or edema. Stable cardiopericardial enlargement. IMPRESSION: 1. Remaining hardware in stable position. 2. Unchanged low volume chest with left more than right basilar atelectasis. Electronically Signed   By: Monte Fantasia M.D.   On: 09/17/2016 08:43    Discharge Medications:   Medication List    STOP taking these medications   clopidogrel 75 MG tablet Commonly known as:  PLAVIX   hydrochlorothiazide 25 MG tablet Commonly known as:  HYDRODIURIL   HYDROcodone-acetaminophen 5-325 MG tablet Commonly known as:  NORCO/VICODIN   isosorbide mononitrate 60 MG 24 hr tablet Commonly known as:  IMDUR   nitroGLYCERIN 0.4 MG SL tablet Commonly known as:  NITROSTAT   traMADol-acetaminophen 37.5-325 MG tablet Commonly known as:  ULTRACET     TAKE these medications   alfuzosin 10 MG 24 hr tablet Commonly known as:  UROXATRAL Take  10 mg by mouth daily with breakfast.   aspirin 81 MG EC tablet Take 1 tablet (81 mg total) by mouth daily.   dicyclomine 10 MG capsule Commonly known as:  BENTYL Take 1-2 capsules (10-20 mg total) by mouth 4 (four) times daily -  before meals and at bedtime.   furosemide 40 MG tablet Commonly known as:  LASIX Take 1 tablet (40 mg total) by mouth daily.   HUMULIN R U-500 KWIKPEN [place] Inject 50 Units into the skin 2 (two) times daily.   lisinopril 5 MG tablet Commonly known as:  PRINIVIL,ZESTRIL Take 1 tablet (5 mg total) by mouth daily. What changed:  See the new instructions.   metFORMIN 500 MG tablet Commonly known as:  GLUCOPHAGE Take 500 mg by mouth 2 (two) times daily.   omeprazole 40 MG capsule Commonly known as:  PRILOSEC Take 40 mg by mouth daily before breakfast.   oxyCODONE 5 MG immediate release tablet Commonly known as:  Oxy IR/ROXICODONE Take 1-2 tablets (5-10 mg total) by mouth every 6 (six) hours as needed for severe pain.   pantoprazole 40 MG tablet Commonly known as:  PROTONIX Take 1 tablet (40 mg total) by mouth daily. What changed:  when to take this   rivaroxaban 20 MG Tabs tablet Commonly  known as:  XARELTO Take 1 tablet (20 mg total) by mouth daily with supper.   rosuvastatin 5 MG tablet Commonly known as:  CRESTOR Take 1 tablet (5 mg total) by mouth every Monday, Wednesday, and Friday. Start taking on:  09/22/2016   sotalol 120 MG tablet Commonly known as:  BETAPACE Take 1 tablet (120 mg total) by mouth 2 (two) times daily.      The patient has been discharged on:   1.Beta Blocker:  Yes [  x ]                              No   [   ]                              If No, reason:  2.Ace Inhibitor/ARB: Yes [  x ]                                     No  [    ]                                     If No, reason:  3.Statin:   Yes [ x  ]                  No  [   ]                  If No, reason:  4.Ecasa:  Yes  [ x  ]                  No    [   ]                  If No, reason:  Follow Up Appointments:  Follow-up Information    Grace Isaac, MD Follow up on 10/28/2016.   Specialty:  Cardiothoracic Surgery Why:  PA/LAT CXR to be taken (at [facility] which is in the same building as Dr. Everrett Coombe office) on 10/28/2016 at 11:30 am;Appointment time is at 12:00 pm Contact information: [place] [address] [phone]        LORI GERHARDT, NP Follow up on 09/29/2016.   Specialties:  Nurse Practitioner, Interventional Cardiology, Cardiology, Radiology Why:  Appointment time is at 3:00 pm Contact information: [place]. [address] [place] [address] [phone]           Signed: Macy Mis 09/21/2016, 7:51 AM

## 2016-09-18 ENCOUNTER — Inpatient Hospital Stay (HOSPITAL_COMMUNITY): Payer: BLUE CROSS/BLUE SHIELD

## 2016-09-18 LAB — BASIC METABOLIC PANEL
Anion gap: 7 (ref 5–15)
BUN: 23 mg/dL — ABNORMAL HIGH (ref 6–20)
CO2: 26 mmol/L (ref 22–32)
Calcium: 8.6 mg/dL — ABNORMAL LOW (ref 8.9–10.3)
Chloride: 103 mmol/L (ref 101–111)
Creatinine, Ser: 1.34 mg/dL — ABNORMAL HIGH (ref 0.61–1.24)
GFR calc Af Amer: >60 mL/min (ref >60)
GFR calc non Af Amer: 52 mL/min — ABNORMAL LOW (ref >60)
Glucose, Bld: 138 mg/dL — ABNORMAL HIGH (ref 65–99)
Potassium: 3.9 mmol/L (ref 3.5–5.1)
Sodium: 136 mmol/L (ref 135–145)

## 2016-09-18 LAB — GLUCOSE, CAPILLARY
GLUCOSE-CAPILLARY: 179 mg/dL — AB (ref 65–99)
GLUCOSE-CAPILLARY: 179 mg/dL — AB (ref 65–99)
GLUCOSE-CAPILLARY: 252 mg/dL — AB (ref 65–99)
Glucose-Capillary: 126 mg/dL — ABNORMAL HIGH (ref 65–99)
Glucose-Capillary: 152 mg/dL — ABNORMAL HIGH (ref 65–99)
Glucose-Capillary: 194 mg/dL — ABNORMAL HIGH (ref 65–99)

## 2016-09-18 LAB — CBC
HCT: 31 % — ABNORMAL LOW (ref 39.0–52.0)
Hemoglobin: 10 g/dL — ABNORMAL LOW (ref 13.0–17.0)
MCH: 29.1 pg (ref 26.0–34.0)
MCHC: 32.3 g/dL (ref 30.0–36.0)
MCV: 90.1 fL (ref 78.0–100.0)
Platelets: 146 10*3/uL — ABNORMAL LOW (ref 150–400)
RBC: 3.44 MIL/uL — ABNORMAL LOW (ref 4.22–5.81)
RDW: 14.5 % (ref 11.5–15.5)
WBC: 9.5 10*3/uL (ref 4.0–10.5)

## 2016-09-18 MED ORDER — TRAMADOL HCL 50 MG PO TABS
50.0000 mg | ORAL_TABLET | ORAL | Status: DC | PRN
  Administered 2016-09-20 – 2016-09-21 (×2): 100 mg via ORAL
  Filled 2016-09-18 (×2): qty 2

## 2016-09-18 MED ORDER — FUROSEMIDE 40 MG PO TABS
40.0000 mg | ORAL_TABLET | Freq: Every day | ORAL | Status: AC
  Administered 2016-09-19 – 2016-09-20 (×2): 40 mg via ORAL
  Filled 2016-09-18 (×2): qty 1

## 2016-09-18 MED ORDER — ORAL CARE MOUTH RINSE
15.0000 mL | Freq: Two times a day (BID) | OROMUCOSAL | Status: DC
  Administered 2016-09-18: 15 mL via OROMUCOSAL

## 2016-09-18 MED ORDER — OXYCODONE HCL 5 MG PO TABS: 5.0000 mg | ORAL_TABLET | ORAL | Status: DC | PRN

## 2016-09-18 MED ORDER — GUAIFENESIN ER 600 MG PO TB12
600.0000 mg | ORAL_TABLET | Freq: Two times a day (BID) | ORAL | Status: DC | PRN
  Administered 2016-09-20: 600 mg via ORAL
  Filled 2016-09-18: qty 1

## 2016-09-18 MED ORDER — ASPIRIN EC 81 MG PO TBEC
81.0000 mg | DELAYED_RELEASE_TABLET | Freq: Every day | ORAL | Status: DC
  Administered 2016-09-19 – 2016-09-21 (×3): 81 mg via ORAL
  Filled 2016-09-18 (×3): qty 1

## 2016-09-18 MED ORDER — SODIUM CHLORIDE 0.9 % IV SOLN: 250.0000 mL | INTRAVENOUS | Status: DC | PRN

## 2016-09-18 MED ORDER — ONDANSETRON HCL 4 MG/2ML IJ SOLN: 4.0000 mg | Freq: Four times a day (QID) | INTRAMUSCULAR | Status: DC | PRN

## 2016-09-18 MED ORDER — INSULIN ASPART 100 UNIT/ML ~~LOC~~ SOLN
0.0000 [IU] | Freq: Three times a day (TID) | SUBCUTANEOUS | Status: DC
  Administered 2016-09-18: 12 [IU] via SUBCUTANEOUS
  Administered 2016-09-18: 4 [IU] via SUBCUTANEOUS
  Administered 2016-09-19: 2 [IU] via SUBCUTANEOUS
  Administered 2016-09-19: 8 [IU] via SUBCUTANEOUS
  Administered 2016-09-19: 4 [IU] via SUBCUTANEOUS
  Administered 2016-09-20: 2 [IU] via SUBCUTANEOUS
  Administered 2016-09-20: 8 [IU] via SUBCUTANEOUS
  Administered 2016-09-20: 2 [IU] via SUBCUTANEOUS
  Administered 2016-09-20: 8 [IU] via SUBCUTANEOUS
  Administered 2016-09-21: 2 [IU] via SUBCUTANEOUS

## 2016-09-18 MED ORDER — CHLORHEXIDINE GLUCONATE 0.12 % MT SOLN
15.0000 mL | Freq: Two times a day (BID) | OROMUCOSAL | Status: DC
Start: 2016-09-18 — End: 2016-09-21
  Administered 2016-09-18 – 2016-09-21 (×3): 15 mL via OROMUCOSAL
  Filled 2016-09-18 (×4): qty 15

## 2016-09-18 MED ORDER — LISINOPRIL 5 MG PO TABS
5.0000 mg | ORAL_TABLET | Freq: Every day | ORAL | Status: DC
  Administered 2016-09-19 – 2016-09-21 (×3): 5 mg via ORAL
  Filled 2016-09-18 (×3): qty 1

## 2016-09-18 MED ORDER — SODIUM CHLORIDE 0.9% FLUSH
3.0000 mL | Freq: Two times a day (BID) | INTRAVENOUS | Status: DC
  Administered 2016-09-18 – 2016-09-20 (×4): 3 mL via INTRAVENOUS

## 2016-09-18 MED ORDER — PANTOPRAZOLE SODIUM 40 MG PO TBEC
40.0000 mg | DELAYED_RELEASE_TABLET | Freq: Every day | ORAL | Status: DC
  Administered 2016-09-19 – 2016-09-21 (×3): 40 mg via ORAL
  Filled 2016-09-18 (×3): qty 1

## 2016-09-18 MED ORDER — ONDANSETRON HCL 4 MG PO TABS: 4.0000 mg | ORAL_TABLET | Freq: Four times a day (QID) | ORAL | Status: DC | PRN

## 2016-09-18 MED ORDER — BISACODYL 5 MG PO TBEC: 10.0000 mg | DELAYED_RELEASE_TABLET | Freq: Every day | ORAL | Status: DC | PRN

## 2016-09-18 MED ORDER — SODIUM CHLORIDE 0.9% FLUSH: 3.0000 mL | INTRAVENOUS | Status: DC | PRN

## 2016-09-18 MED ORDER — DOCUSATE SODIUM 100 MG PO CAPS
200.0000 mg | ORAL_CAPSULE | Freq: Every day | ORAL | Status: DC
  Administered 2016-09-19: 200 mg via ORAL
  Filled 2016-09-18: qty 2

## 2016-09-18 MED ORDER — MOVING RIGHT ALONG BOOK
Freq: Once | Status: AC
  Administered 2016-09-18: 13:00:00
  Filled 2016-09-18: qty 1

## 2016-09-18 MED ORDER — BISACODYL 10 MG RE SUPP: 10.0000 mg | Freq: Every day | RECTAL | Status: DC | PRN

## 2016-09-18 NOTE — Progress Notes (Signed)
Patient ID: Charles [name], male   DOB: 12/10/1945, 70 y.o.   MRN: [id number] TCTS DAILY ICU PROGRESS NOTE                   [address]            [place] [address]          [phone]   3 Days Post-Op Procedure(s) (LRB): CORONARY ARTERY BYPASS GRAFTING (CABG) x four, using left internal mammary artery and right leg greater saphenous vein harvested endscopically (N/A) TRANSESOPHAGEAL ECHOCARDIOGRAM (TEE) (N/A)  Total Length of Stay:  LOS: 11 days   Subjective: Up in chair, feels well today , off drips   Objective: Vital signs in last 24 hours: Temp:  [97.5 F (36.4 C)-98.9 F (37.2 C)] 97.5 F (36.4 C) (10/28 0725) Pulse Rate:  [44-99] 89 (10/28 0645) Cardiac Rhythm: Atrial paced (10/28 0645) Resp:  [17-18] 18 (10/27 1300) BP: (83-122)/(36-67) 122/63 (10/28 0645) SpO2:  [100 %] 100 % (10/27 1300) Arterial Line BP: (129)/(62) 129/62 (10/27 0900) Weight:  [293 lb 11.2 oz (133.2 kg)] 293 lb 11.2 oz (133.2 kg) (10/28 0600)  Filed Weights   09/16/16 0500 09/17/16 0132 09/18/16 0600  Weight: 299 lb 9.7 oz (135.9 kg) 293 lb 3.4 oz (133 kg) 293 lb 11.2 oz (133.2 kg)    Weight change: 7.8 oz (0.221 kg)   Hemodynamic parameters for last 24 hours:    Intake/Output from previous day: 10/27 0701 - 10/28 0700 In: 360 [P.O.:360] Out: 1575 [Urine:1575]  Intake/Output this shift: No intake/output data recorded.  Current Meds: Scheduled Meds: . acetaminophen  1,000 mg Oral Q6H   Or  . acetaminophen (TYLENOL) oral liquid 160 mg/5 mL  1,000 mg Per Tube Q6H  . alfuzosin  10 mg Oral Q breakfast  . aspirin EC  325 mg Oral Daily   Or  . aspirin  324 mg Per Tube Daily  . bisacodyl  10 mg Oral Daily   Or  . bisacodyl  10 mg Rectal Daily  . chlorhexidine  15 mL Mouth Rinse BID  . chlorhexidine gluconate (MEDLINE KIT)  15 mL Mouth Rinse BID  . clopidogrel  75 mg Oral Q breakfast  . dicyclomine  10 mg Oral TID AC & HS  . docusate sodium  200 mg Oral Daily  .  enoxaparin (LOVENOX) injection  30 mg Subcutaneous Q24H  . insulin aspart  0-24 Units Subcutaneous Q4H  . insulin glargine  30 Units Subcutaneous Daily  . mouth rinse  15 mL Mouth Rinse q12n4p  . metoprolol tartrate  12.5 mg Oral BID   Or  . metoprolol tartrate  12.5 mg Per Tube BID  . pantoprazole  40 mg Oral Daily  . rosuvastatin  5 mg Oral Q M,W,F  . sodium chloride flush  3 mL Intravenous Q12H  . sotalol  120 mg Oral BID   Continuous Infusions: . sodium chloride    . sodium chloride    . sodium chloride Stopped (09/17/16 2000)  . dexmedetomidine Stopped (09/15/16 2030)  . lactated ringers Stopped (09/17/16 2000)  . lactated ringers Stopped (09/15/16 1900)   PRN Meds:.sodium chloride, lactated ringers, metoprolol, midazolam, ondansetron (ZOFRAN) IV, sodium chloride flush, traMADol  General appearance: alert, appears stated age and no distress Neurologic: intact Heart: irregularly irregular rhythm Lungs: diminished breath sounds bibasilar Abdomen: soft, non-tender; bowel sounds normal; no masses,  no organomegaly Extremities: extremities normal, atraumatic, no cyanosis or edema and Homans sign is   negative, no sign of DVT Wound: sternum intact  Lab Results: CBC: Recent Labs  09/17/16 0420 09/18/16 0600  WBC 10.7* 9.5  HGB 10.4* 10.0*  HCT 31.7* 31.0*  PLT 130* 146*   BMET:  Recent Labs  09/17/16 0420 09/18/16 0600  NA 136 136  K 4.6 3.9  CL 102 103  CO2 24 26  GLUCOSE 200* 138*  BUN 17 23*  CREATININE 1.39* 1.34*  CALCIUM 8.7* 8.6*    CMET: Lab Results  Component Value Date   WBC 9.5 09/18/2016   HGB 10.0 (L) 09/18/2016   HCT 31.0 (L) 09/18/2016   PLT 146 (L) 09/18/2016   GLUCOSE 138 (H) 09/18/2016   CHOL 122 (L) 08/17/2016   TRIG 133 08/17/2016   HDL 42 08/17/2016   LDLCALC 53 08/17/2016   ALT 64 (H) 09/14/2016   AST 39 09/14/2016   NA 136 09/18/2016   K 3.9 09/18/2016   CL 103 09/18/2016   CREATININE 1.34 (H) 09/18/2016   BUN 23 (H)  09/18/2016   CO2 26 09/18/2016   TSH 1.951 07/29/2016   INR 1.31 09/15/2016   HGBA1C 6.3 (H) 09/14/2016    PT/INR:  Recent Labs  09/15/16 1608  LABPROT 16.3*  INR 1.31   Radiology: No results found.   Assessment/Plan: S/P Procedure(s) (LRB): CORONARY ARTERY BYPASS GRAFTING (CABG) x four, using left internal mammary artery and right leg greater saphenous vein harvested endscopically (N/A) TRANSESOPHAGEAL ECHOCARDIOGRAM (TEE) (N/A) Mobilize Diuresis Diabetes control Plan for transfer to step-down: see transfer orders Frequent pac, a paced  On plavix and lovenox currently , plan xarelto after pacing wires are out  I have seen and examined Charles [name] and agree with the above assessment  and plan.  Grace Isaac MD Beeper [phone] Office [phone] 09/18/2016 8:59 AM      Grace Isaac 09/18/2016 8:57 AM

## 2016-09-19 ENCOUNTER — Inpatient Hospital Stay (HOSPITAL_COMMUNITY): Payer: BLUE CROSS/BLUE SHIELD

## 2016-09-19 LAB — CBC
HCT: 33.4 % — ABNORMAL LOW (ref 39.0–52.0)
Hemoglobin: 10.7 g/dL — ABNORMAL LOW (ref 13.0–17.0)
MCH: 29.1 pg (ref 26.0–34.0)
MCHC: 32 g/dL (ref 30.0–36.0)
MCV: 90.8 fL (ref 78.0–100.0)
Platelets: 211 10*3/uL (ref 150–400)
RBC: 3.68 MIL/uL — ABNORMAL LOW (ref 4.22–5.81)
RDW: 14.4 % (ref 11.5–15.5)
WBC: 8.5 10*3/uL (ref 4.0–10.5)

## 2016-09-19 LAB — BASIC METABOLIC PANEL
Anion gap: 9 (ref 5–15)
BUN: 25 mg/dL — ABNORMAL HIGH (ref 6–20)
CO2: 26 mmol/L (ref 22–32)
Calcium: 8.9 mg/dL (ref 8.9–10.3)
Chloride: 101 mmol/L (ref 101–111)
Creatinine, Ser: 1.31 mg/dL — ABNORMAL HIGH (ref 0.61–1.24)
GFR calc Af Amer: >60 mL/min (ref >60)
GFR calc non Af Amer: 54 mL/min — ABNORMAL LOW (ref >60)
Glucose, Bld: 140 mg/dL — ABNORMAL HIGH (ref 65–99)
Potassium: 4 mmol/L (ref 3.5–5.1)
Sodium: 136 mmol/L (ref 135–145)

## 2016-09-19 LAB — GLUCOSE, CAPILLARY
GLUCOSE-CAPILLARY: 141 mg/dL — AB (ref 65–99)
GLUCOSE-CAPILLARY: 181 mg/dL — AB (ref 65–99)
Glucose-Capillary: 186 mg/dL — ABNORMAL HIGH (ref 65–99)
Glucose-Capillary: 222 mg/dL — ABNORMAL HIGH (ref 65–99)

## 2016-09-19 MED ORDER — RIVAROXABAN 20 MG PO TABS
20.0000 mg | ORAL_TABLET | Freq: Every day | ORAL | Status: DC
  Administered 2016-09-19 – 2016-09-20 (×2): 20 mg via ORAL
  Filled 2016-09-19 (×2): qty 1

## 2016-09-19 MED ORDER — INSULIN GLARGINE 100 UNIT/ML ~~LOC~~ SOLN
34.0000 [IU] | Freq: Every day | SUBCUTANEOUS | Status: DC
  Administered 2016-09-19 – 2016-09-21 (×3): 34 [IU] via SUBCUTANEOUS
  Filled 2016-09-19 (×3): qty 0.34

## 2016-09-19 NOTE — Progress Notes (Addendum)
      [address]       [place] [address]             [phone]        4 Days Post-Op Procedure(s) (LRB): CORONARY ARTERY BYPASS GRAFTING (CABG) x four, using left internal mammary artery and right leg greater saphenous vein harvested endscopically (N/A) TRANSESOPHAGEAL ECHOCARDIOGRAM (TEE) (N/A)  Subjective: Patient has cough and throat sore.  Objective: Vital signs in last 24 hours: Temp:  [97.5 F (36.4 C)-97.6 F (36.4 C)] 97.5 F (36.4 C) (10/29 0559) Pulse Rate:  [82-91] 85 (10/29 0559) Cardiac Rhythm: Atrial paced;Atrial fibrillation;Bundle branch block (10/29 0700) Resp:  [9-18] 18 (10/29 0559) BP: (106-127)/(54-97) 127/57 (10/29 0559) SpO2:  [92 %-98 %] 98 % (10/29 0559) Weight:  [291 lb 9.6 oz (132.3 kg)-291 lb 14.4 oz (132.4 kg)] 291 lb 9.6 oz (132.3 kg) (10/29 0559)  Pre op weight 130 kg Current Weight  09/19/16 291 lb 9.6 oz (132.3 kg)      Intake/Output from previous day: 10/28 0701 - 10/29 0700 In: 480 [P.O.:480] Out: 800 [Urine:800]   Physical Exam:  Cardiovascular: RRR, no murmurs, gallops, or rubs. Pulmonary: Clear to auscultation bilaterally; no rales, wheezes, or rhonchi. Abdomen: Soft, non tender, bowel sounds present. Extremities: Mild bilateral lower extremity edema. Wounds: Clean and dry.  No erythema or signs of infection.  Lab Results: CBC: Recent Labs  09/17/16 0420 09/18/16 0600  WBC 10.7* 9.5  HGB 10.4* 10.0*  HCT 31.7* 31.0*  PLT 130* 146*   BMET:  Recent Labs  09/17/16 0420 09/18/16 0600  NA 136 136  K 4.6 3.9  CL 102 103  CO2 24 26  GLUCOSE 200* 138*  BUN 17 23*  CREATININE 1.39* 1.34*  CALCIUM 8.7* 8.6*    PT/INR:  Lab Results  Component Value Date   INR 1.31 09/15/2016   INR 1.11 09/14/2016   INR 1.15 09/08/2016   ABG:  INR: Will add last result for INR, ABG once components are confirmed Will add last 4 CBG results once components are confirmed  Assessment/Plan:  1. CV - A  paced, previous PACs. On Sotalol 120 mg bid, Lisinopril 5 mg daily, and Plavix 75 mg daily. Per Dr. Servando Snare, restart Xarelto this evening. As discussed with Kerin Ransom PA-C, stop Plavix and continue Xarelto and ecasa. 2.  Pulmonary - On room air. Throat lozenges prn (wife wants to bring in) and Mucinex bid for cough. CXR this am shows mild cardiomegaly, small left pleural effusion, atelectasis at bases, and no pneumothorax . Encourage incentive spirometer. 3. Volume Overload - On Lasix 40 mg daily 4.  Acute blood loss anemia - H and H yesterday stable at 10 and 31. 5. Mild thrombocytopenia-last platelets up to 146,000 6. Last creatinine stable at 1.34. (1.38 upon admission) 7. DM-CBGs 194/252/141. On Insulin only. Was on Metformin pre op. Will restart at discharge. Increase Insulin for better glucose control. Pre op HGA1C 6.3 9. Remove EPW   ZIMMERMAN,DONIELLE MPA-C 09/19/2016,7:53 AM   Resume dm meds  Cr stable Wires out today and start xarleto back later today  Home Tuesday  I have seen and examined Charles [name] and agree with the above assessment  and plan.  Grace Isaac MD Beeper [phone] Office [phone] 09/19/2016 11:47 AM

## 2016-09-19 NOTE — Progress Notes (Signed)
Removed epicardial wires per order. 3 intact.  Pt tolerated procedure well.  Pt instructed to remain on bedrest for one hour.  Frequent vitals will be taken and documented. Pt resting with call bell within reach. Charles Gillispie McClintock, RN   

## 2016-09-20 DIAGNOSIS — G4733 Obstructive sleep apnea (adult) (pediatric): Secondary | ICD-10-CM

## 2016-09-20 DIAGNOSIS — Z9989 Dependence on other enabling machines and devices: Secondary | ICD-10-CM

## 2016-09-20 LAB — GLUCOSE, CAPILLARY
GLUCOSE-CAPILLARY: 130 mg/dL — AB (ref 65–99)
GLUCOSE-CAPILLARY: 157 mg/dL — AB (ref 65–99)
Glucose-Capillary: 248 mg/dL — ABNORMAL HIGH (ref 65–99)
Glucose-Capillary: 234 mg/dL — ABNORMAL HIGH (ref 65–99)

## 2016-09-20 NOTE — Progress Notes (Signed)
Subjective:  No chest pain; c/o chest soreness, and throat soreness  Objective:   Vital Signs : Vitals:   09/19/16 1800 09/19/16 2127 09/20/16 0530 09/20/16 1330  BP: (!) 105/40 (!) 110/48 (!) 104/47 (!) 117/53  Pulse: 76 87 83 88  Resp:  _0 Temp:  98.1 F (36.7 C) 97.5 F (36.4 C) 97.7 F (36.5 C)  TempSrc:  Oral Oral Oral  SpO2:  98% 99% 98%  Weight:   290 lb 12.8 oz (131.9 kg)   Height:        Intake/Output from previous day:  Intake/Output Summary (Last 24 hours) at 09/20/16 1351 Last data filed at 09/20/16 1349  Gross per 24 hour  Intake              720 ml  Output              600 ml  Net              120 ml    I/O since admission: -1817  Wt Readings from Last 3 Encounters:  09/20/16 290 lb 12.8 oz (131.9 kg)  08/09/16 289 lb 6.4 oz (131.3 kg)  08/03/16 289 lb 11.2 oz (131.4 kg)    Medications: . alfuzosin  10 mg Oral Q breakfast  . aspirin EC  81 mg Oral Daily  . chlorhexidine  15 mL Mouth Rinse BID  . chlorhexidine gluconate (MEDLINE KIT)  15 mL Mouth Rinse BID  . dicyclomine  10 mg Oral TID AC & HS  . docusate sodium  200 mg Oral Daily  . furosemide  40 mg Oral Daily  . insulin aspart  0-24 Units Subcutaneous TID AC & HS  . insulin glargine  34 Units Subcutaneous Daily  . lisinopril  5 mg Oral Daily  . mouth rinse  15 mL Mouth Rinse q12n4p  . pantoprazole  40 mg Oral QAC breakfast  . rivaroxaban  20 mg Oral Q supper  . rosuvastatin  5 mg Oral Q M,W,F  . sodium chloride flush  3 mL Intravenous Q12H  . sotalol  120 mg Oral BID       Physical Exam:   General appearance: alert, cooperative and no distress Neck: no carotid bruit, no JVD, supple, symmetrical, trachea midline, thyroid not enlarged, symmetric, no tenderness/mass/nodules and thick neck Lungs: decreased BS at bases Heart: regular rate and rhythm and 1/6 sem Abdomen: soft, non-tender; bowel sounds normal; no masses,  no organomegaly Extremities: 2+ edema  bilaterally Neurologic: Grossly normal   Rate: 80   Rhythm: A pacing intermittently  ECG (independently read by me): A paced at 77  Lab Results:   Recent Labs  09/18/16 0600 09/19/16 0734  NA 136 136  K 3.9 4.0  CL 103 101  CO2 26 26  GLUCOSE 138* 140*  BUN 23* 25*  CREATININE 1.34* 1.31*  CALCIUM 8.6* 8.9    Hepatic Function Latest Ref Rng & Units 09/14/2016 09/07/2016 08/17/2016  Total Protein 6.5 - 8.1 g/dL 7.1 7.6 6.6  Albumin 3.5 - 5.0 g/dL 4.0 4.0 3.8  AST 15 - 41 U/L 39 15 9(L)  ALT 17 - 63 U/L 64(H) 17 12  Alk Phosphatase 38 - 126 U/L 62 56 52  Total Bilirubin 0.3 - 1.2 mg/dL 0.5 0.6 0.3  Bilirubin, Direct <=0.2 mg/dL - - 0.1     Recent Labs  09/18/16 0600 09/19/16 0734  WBC 9.5 8.5  HGB 10.0* 10.7*  HCT 31.0* 33.4*  MCV 90.1 90.8  PLT 146* 211    No results for input(s): TROPONINI in the last 72 hours.  Invalid input(s): CK, MB  Lab Results  Component Value Date   TSH 1.951 07/29/2016   No results for input(s): HGBA1C in the last 72 hours.  No results for input(s): PROT, ALBUMIN, AST, ALT, ALKPHOS, BILITOT, BILIDIR, IBILI in the last 72 hours. No results for input(s): INR in the last 72 hours. BNP (last 3 results)  Recent Labs  08/02/16 2315  BNP 188.5*    ProBNP (last 3 results)  Recent Labs  10/21/15 0932  PROBNP 364.0*     Lipid Panel     Component Value Date/Time   CHOL 122 (L) 08/17/2016 0750   TRIG 133 08/17/2016 0750   HDL 42 08/17/2016 0750   CHOLHDL 2.9 08/17/2016 0750   VLDL 27 08/17/2016 0750   LDLCALC 53 08/17/2016 0750      Imaging:  Dg Chest 2 View  Result Date: 09/19/2016 CLINICAL DATA:  Postoperative.  Cough. EXAM: CHEST  2 VIEW COMPARISON:  Chest radiograph from one day prior. FINDINGS: Stable configuration of sternotomy wires and 2 lead left subclavian pacemaker. Stable cardiomediastinal silhouette with mild cardiomegaly and aortic atherosclerosis. No pneumothorax. Trace left pleural effusion  appears slightly decreased. No right pleural effusion. No overt pulmonary edema. Mild platelike right lung base atelectasis, stable. Moderate left lung base opacity, favor atelectasis, slightly improved. IMPRESSION: 1. Stable mild cardiomegaly without pulmonary edema. 2. Trace left pleural effusion, slightly decreased. 3. Mild right lung base atelectasis, stable. Moderate left lung base opacity, favor atelectasis, slightly improved. 4. Aortic atherosclerosis. Electronically Signed   By: Ilona Sorrel M.D.   On: 09/19/2016 08:19      Assessment/Plan:   Principal Problem:   NSTEMI (non-ST elevated myocardial infarction) ([facility]) Active Problems:   Diabetes mellitus type 2 in obese ([facility])   Unstable angina ([facility])   Coronary artery disease involving native coronary artery of native heart   Uncontrolled type 2 diabetes mellitus with complication ([facility])   Chronic combined systolic and diastolic CHF (congestive heart failure) ([facility])   Sick sinus syndrome ([facility])   OSA on CPAP   Chronic pain syndrome   Cardiomyopathy, ischemic   S/P CABG x 4  1. Day 5 s/p CABG 2. S/p PTCA for in-stent re-stenosis on 07/29/16 at which time plan was for triple therapy for 30 days, with plan to dc plavix and continue with baby ASA and  Xarelto.  No need to re-start plavix, since no stent was inserted at that procedure.  3. PAF:  On xarelto 4. Volume overload: continue with diuresis 5. Stage 3 CKD 6. OSA on CPAP; very important to continue for 7- 8 hr duration nocturnally    Troy Sine, MD, [facility] 09/20/2016, 1:51 PM

## 2016-09-20 NOTE — Progress Notes (Signed)
CARDIAC REHAB PHASE I   PRE:  Rate/Rhythm: paced 84  BP:  Supine:   Sitting: 117/65  Standing:    SaO2: 97%RA  MODE:  Ambulation: 650 ft   POST:  Rate/Rhythm: 95  BP:  Supine:   Sitting: 133/89  Standing:    SaO2: 99%RA 0945-1015 Pt walked 650 ft on RA with rolling walker with steady gait. Stopped a few times to rest. Second walk today. Having some throat issues per pt with not being able to eat his toast this morning but only able to get soft foods down. To recliner with call bell. Has walker at home if needed.   Graylon Good, RN BSN  09/20/2016 10:12 AM

## 2016-09-20 NOTE — Progress Notes (Addendum)
      [address]       [place] [address]             [phone]      5 Days Post-Op Procedure(s) (LRB): CORONARY ARTERY BYPASS GRAFTING (CABG) x four, using left internal mammary artery and right leg greater saphenous vein harvested endscopically (N/A) TRANSESOPHAGEAL ECHOCARDIOGRAM (TEE) (N/A)   Subjective:  No new complaints.  Continues to have throat discomfort.  Objective: Vital signs in last 24 hours: Temp:  [97.5 F (36.4 C)-98.1 F (36.7 C)] 97.5 F (36.4 C) (10/30 0530) Pulse Rate:  [42-87] 83 (10/30 0530) Cardiac Rhythm: Atrial fibrillation (10/29 1900) Resp:  [18-19] 18 (10/30 0530) BP: (101-125)/(40-84) 104/47 (10/30 0530) SpO2:  [98 %-99 %] 99 % (10/30 0530) Weight:  [290 lb 12.8 oz (131.9 kg)] 290 lb 12.8 oz (131.9 kg) (10/30 0530)  Intake/Output from previous day: 10/29 0701 - 10/30 0700 In: 240 [P.O.:240] Out: 700 [Urine:700]  General appearance: alert, cooperative and no distress Heart: irregularly irregular rhythm Lungs: clear to auscultation bilaterally Abdomen: soft, non-tender; bowel sounds normal; no masses,  no organomegaly Extremities: edema trace-1+ Wound: clean and dry  Lab Results:  Recent Labs  09/18/16 0600 09/19/16 0734  WBC 9.5 8.5  HGB 10.0* 10.7*  HCT 31.0* 33.4*  PLT 146* 211   BMET:  Recent Labs  09/18/16 0600 09/19/16 0734  NA 136 136  K 3.9 4.0  CL 103 101  CO2 26 26  GLUCOSE 138* 140*  BUN 23* 25*  CREATININE 1.34* 1.31*  CALCIUM 8.6* 8.9    PT/INR: No results for input(s): LABPROT, INR in the last 72 hours. ABG    Component Value Date/Time   PHART 7.389 09/15/2016 2139   HCO3 22.8 09/15/2016 2139   TCO2 22 09/16/2016 1710   ACIDBASEDEF 2.0 09/15/2016 2139   O2SAT 95.0 09/15/2016 2139   CBG (last 3)   Recent Labs  09/19/16 1631 09/19/16 2210 09/20/16 0617  GLUCAP 186* 222* 130*    Assessment/Plan: S/P Procedure(s) (LRB): CORONARY ARTERY BYPASS GRAFTING (CABG) x four, using  left internal mammary artery and right leg greater saphenous vein harvested endscopically (N/A) TRANSESOPHAGEAL ECHOCARDIOGRAM (TEE) (N/A)  1. CV- H/O PAF, continue Sotalol, Lisinopril, Xarelto.Marland KitchenMarland KitchenClarified with Kerin Ransom over the weekend, no Plavix  2. Pulm- no acute issues, off oxygen, continue Mucinex for cough, lozenges for sore throat 3. Renal-creatinine improving, slight rise in weight... Continue Lasix 4. DM- sugars okay, continue current care, will resume Metformin at discharge tomorrow 5. Dispo- patient stable, continue current care.. Home in AM   LOS: 13 days    [name], Charles 09/20/2016   Interventional cardiology has seen and agrees that will d/c home on asa and xarelto not plavix  I have seen and examined Charles [name] and agree with the above assessment  and plan.  Grace Isaac MD Beeper [phone] Office [phone] 09/20/2016 5:17 PM

## 2016-09-20 NOTE — Progress Notes (Signed)
Pt ambulated 500 ft with rolling walker on RA.  Pt tolerated well.  RN will continue to monitor.  Claudette Stapler, RN

## 2016-09-21 DIAGNOSIS — K219 Gastro-esophageal reflux disease without esophagitis: Secondary | ICD-10-CM

## 2016-09-21 LAB — GLUCOSE, CAPILLARY: Glucose-Capillary: 124 mg/dL — ABNORMAL HIGH (ref 65–99)

## 2016-09-21 MED ORDER — ROSUVASTATIN CALCIUM 5 MG PO TABS: 5.0000 mg | ORAL_TABLET | ORAL | 1 refills | Status: DC

## 2016-09-21 MED ORDER — FUROSEMIDE 40 MG PO TABS: 40.0000 mg | ORAL_TABLET | Freq: Every day | ORAL | 0 refills | Status: DC

## 2016-09-21 MED ORDER — OXYCODONE HCL 5 MG PO TABS: 5.0000 mg | ORAL_TABLET | Freq: Four times a day (QID) | ORAL | 0 refills | Status: DC | PRN

## 2016-09-21 MED ORDER — LISINOPRIL 5 MG PO TABS: 5.0000 mg | ORAL_TABLET | Freq: Every day | ORAL | 1 refills | Status: DC

## 2016-09-21 MED ORDER — ASPIRIN 81 MG PO TBEC: 81.0000 mg | DELAYED_RELEASE_TABLET | Freq: Every day | ORAL | Status: DC

## 2016-09-21 NOTE — Progress Notes (Signed)
1020-1100 Education completed with pt and wife who voiced understanding. Encouraged IS and walking. Gave ex ed. Discussed heart healthy eating and watching carbs. Discussed CRP 2 and will refer to [facility]. Pt stated he needed 3n1 for home. Notified case Warehouse manager. Graylon Good RN BSN 09/21/2016 10:58 AM

## 2016-09-21 NOTE — Progress Notes (Signed)
[address]       [place] [address]             [phone]      6 Days Post-Op Procedure(s) (LRB): CORONARY ARTERY BYPASS GRAFTING (CABG) x four, using left internal mammary artery and right leg greater saphenous vein harvested endscopically (N/A) TRANSESOPHAGEAL ECHOCARDIOGRAM (TEE) (N/A) Subjective: Feels pretty well  Objective: Vital signs in last 24 hours: Temp:  [97.6 F (36.4 C)-98.5 F (36.9 C)] 97.6 F (36.4 C) (10/31 0448) Pulse Rate:  [70-88] 70 (10/31 0448) Cardiac Rhythm: Atrial paced (10/30 1940) Resp:  [18] 18 (10/31 0448) BP: (106-125)/(53-67) 125/67 (10/31 0448) SpO2:  [98 %-100 %] 100 % (10/31 0448) Weight:  [291 lb 8 oz (132.2 kg)] 291 lb 8 oz (132.2 kg) (10/31 0448)  Hemodynamic parameters for last 24 hours:    Intake/Output from previous day: 10/30 0701 - 10/31 0700 In: 720 [P.O.:720] Out: 1400 [Urine:1400] Intake/Output this shift: No intake/output data recorded.  General appearance: alert, cooperative and no distress Heart: regular rate and rhythm Lungs: mildly dim in bases Abdomen: benign Extremities: + edema Wound: incis healing well  Lab Results:  Recent Labs  09/19/16 0734  WBC 8.5  HGB 10.7*  HCT 33.4*  PLT 211   BMET:  Recent Labs  09/19/16 0734  NA 136  K 4.0  CL 101  CO2 26  GLUCOSE 140*  BUN 25*  CREATININE 1.31*  CALCIUM 8.9    PT/INR: No results for input(s): LABPROT, INR in the last 72 hours. ABG    Component Value Date/Time   PHART 7.389 09/15/2016 2139   HCO3 22.8 09/15/2016 2139   TCO2 22 09/16/2016 1710   ACIDBASEDEF 2.0 09/15/2016 2139   O2SAT 95.0 09/15/2016 2139   CBG (last 3)   Recent Labs  09/20/16 1643 09/20/16 2131 09/21/16 0650  GLUCAP 248* 234* 124*    Meds Scheduled Meds: . alfuzosin  10 mg Oral Q breakfast  . aspirin EC  81 mg Oral Daily  . chlorhexidine  15 mL Mouth Rinse BID  . chlorhexidine gluconate (MEDLINE KIT)  15 mL Mouth Rinse BID  .  dicyclomine  10 mg Oral TID AC & HS  . docusate sodium  200 mg Oral Daily  . furosemide  40 mg Oral Daily  . insulin aspart  0-24 Units Subcutaneous TID AC & HS  . insulin glargine  34 Units Subcutaneous Daily  . lisinopril  5 mg Oral Daily  . mouth rinse  15 mL Mouth Rinse q12n4p  . pantoprazole  40 mg Oral QAC breakfast  . rivaroxaban  20 mg Oral Q supper  . rosuvastatin  5 mg Oral Q M,W,F  . sodium chloride flush  3 mL Intravenous Q12H  . sotalol  120 mg Oral BID   Continuous Infusions:  PRN Meds:.sodium chloride, bisacodyl **OR** bisacodyl, guaiFENesin, ondansetron **OR** ondansetron (ZOFRAN) IV, oxyCODONE, sodium chloride flush, traMADol  Xrays No results found.  Assessment/Plan: S/P Procedure(s) (LRB): CORONARY ARTERY BYPASS GRAFTING (CABG) x four, using left internal mammary artery and right leg greater saphenous vein harvested endscopically (N/A) TRANSESOPHAGEAL ECHOCARDIOGRAM (TEE) (N/A)  1 conts with steady progress, stable for discharge 2 will resume home DM meds   LOS: 14 days    Ryver Poblete E 09/21/2016

## 2016-09-21 NOTE — Care Management Note (Signed)
Case Management Note Original Note Created by Jacqlyn Krauss 09/13/16  Patient Details  Name: Charles [name] MRN: [id number] Date of Birth: 1946/07/01  Subjective/Objective:   Pt presented for Chest pain, NSTEMI- post cath revealed CAD with 3 vessel disease. Plan for CABG 09-15-16 due to Plavix washout. Pt is from home with wife. Plan to return home once stable.                  Action/Plan: CM did speak with pt in regards to disposition needs. Wife had concerns to [place] and toilet riser. CM did state that both can be found at [facility]. Pt has used them in the past. CM did make pt aware that insurance will only cover a small portion of the motor and rest of the expense to the family. Wife wanted to rent the chair and [facility] does not rent the chairs. CM did call [facility] and they only have medium chairs to rent. [place] rents the lift chairs, however none available to rent until Nov 20th. CM will make family aware. Toilet riser to be purchased from [facility]. CM will continue to monitor post procedure.  Expected Discharge Date:   09/21/16               Expected Discharge Plan:  [place]  In-House Referral:     Discharge planning Services  CM Consult  Post Acute Care Choice:  Durable Medical Equipment Choice offered to:  Spouse  DME Arranged:  3-N-1 DME Agency:  [place]:    [place]:     Status of Service:  Completed, signed off  If discussed at [place] of Stay Meetings, dates discussed:  09-14-16, 09/21/16  Additional Comments:  09/21/16- 1100- Meg Niemeier RN, CM- pt for d/c home today with wife- wife requesting wide 3n1- call made to [place] with [facility]- they do not carry the wide 3n1 here in house wife would have to go to store in and pick up- per [employer] store on [address] should have in [place]- order placed and printed for wife along with demo sheet to take to [facility] store to get [place], [place],  RN 09/17/2016, 11:14 AM--CM spoke with pt - pt alert and oriented. Pt states PTA he was independent at home with wife.  Pt states his wife will be with him for recommended supervision at discharge.  CM will continue to follow for discharge needs   Dawayne Patricia, RN 09/21/2016, 11:09 AM [phone]

## 2016-09-21 NOTE — Progress Notes (Signed)
Patient Name: Charles [name] Date of Encounter: 09/21/2016  Primary Cardiologist: Dr. Debbe Odea Problem List     Principal Problem:   NSTEMI (non-ST elevated myocardial infarction) [facility]) Active Problems:   Diabetes mellitus type 2 in obese ([place])   Unstable angina ([place])   Coronary artery disease involving native coronary artery of native heart   Uncontrolled type 2 diabetes mellitus with complication (HCC)   Chronic combined systolic and diastolic CHF (congestive heart failure) (HCC)   Sick sinus syndrome (HCC)   OSA on CPAP   Chronic pain syndrome   Cardiomyopathy, ischemic   S/P CABG x 4     Subjective   Going home, feels well. Still has irritation in his throat.   Inpatient Medications    Scheduled Meds: . alfuzosin  10 mg Oral Q breakfast  . aspirin EC  81 mg Oral Daily  . chlorhexidine  15 mL Mouth Rinse BID  . chlorhexidine gluconate (MEDLINE KIT)  15 mL Mouth Rinse BID  . dicyclomine  10 mg Oral TID AC & HS  . docusate sodium  200 mg Oral Daily  . insulin aspart  0-24 Units Subcutaneous TID AC & HS  . insulin glargine  34 Units Subcutaneous Daily  . lisinopril  5 mg Oral Daily  . mouth rinse  15 mL Mouth Rinse q12n4p  . pantoprazole  40 mg Oral QAC breakfast  . rivaroxaban  20 mg Oral Q supper  . rosuvastatin  5 mg Oral Q M,W,F  . sodium chloride flush  3 mL Intravenous Q12H  . sotalol  120 mg Oral BID   Continuous Infusions:   PRN Meds: sodium chloride, bisacodyl **OR** bisacodyl, guaiFENesin, ondansetron **OR** ondansetron (ZOFRAN) IV, oxyCODONE, sodium chloride flush, traMADol   Vital Signs    Vitals:   09/20/16 0530 09/20/16 1330 09/20/16 2207 09/21/16 0448  BP: (!) 104/47 (!) 117/53 106/65 125/67  Pulse: 83 88 77 70  Resp: 18 18 18 18   Temp: 97.5 F (36.4 C) 97.7 F (36.5 C) 98.5 F (36.9 C) 97.6 F (36.4 C)  TempSrc: Oral Oral Oral Oral  SpO2: 99% 98% 99% 100%  Weight: 290 lb 12.8 oz (131.9 kg)   291 lb 8 oz (132.2 kg)    Height:        Intake/Output Summary (Last 24 hours) at 09/21/16 1105 Last data filed at 09/21/16 0900  Gross per 24 hour  Intake              720 ml  Output             1600 ml  Net             -880 ml   Filed Weights   09/19/16 0559 09/20/16 0530 09/21/16 0448  Weight: 291 lb 9.6 oz (132.3 kg) 290 lb 12.8 oz (131.9 kg) 291 lb 8 oz (132.2 kg)    Physical Exam   GEN: Well nourished, well developed, in no acute distress.  HEENT: Grossly normal.  Neck: Supple, no JVD, carotid bruits, or masses. Cardiac: RRR, no murmurs, rubs, or gallops. No clubbing, cyanosis, edema.  Radials/DP/PT 2+ and equal bilaterally.  Respiratory:  Respirations regular and unlabored, clear to auscultation bilaterally. GI: Soft, nontender, nondistended, BS + x 4. MS: no deformity or atrophy. Skin: warm and dry, no rash. Neuro:  Strength and sensation are intact. Psych: AAOx3.  Normal affect.  Labs    CBC  Recent Labs  09/19/16 0734  WBC 8.5  HGB 10.7*  HCT 33.4*  MCV 90.8  PLT 295   Basic Metabolic Panel  Recent Labs  09/19/16 0734  NA 136  K 4.0  CL 101  CO2 26  GLUCOSE 140*  BUN 25*  CREATININE 1.31*  CALCIUM 8.9     Telemetry     A paced at times- Personally Reviewed  ECG     A paced - Personally Reviewed  Radiology    No results found.  Cardiac Studies   Intravascular Pressure Wire/FFR Study  Left Heart Cath and Coronary Angiography 09/08/16    1st Diag lesion, 75 %stenosed.  Prox Cx lesion, 0 %stenosed.  Ost RCA to Prox RCA lesion, 10 %stenosed.  Mid Cx lesion, 80 %stenosed.  Ost 2nd Mrg to 2nd Mrg lesion, 90 %stenosed.  Prox LAD lesion, 65 %stenosed.  Ost 1st Diag to 1st Diag lesion, 99 %stenosed.  RPDA-1 lesion, 70 %stenosed.  RPDA-2 lesion, 75 %stenosed.  Dist LAD lesion, 80 %stenosed.  Ost Ramus to Ramus lesion, 95 %stenosed.  Mid Cx to Dist Cx lesion, 90 %stenosed.  The left ventricular ejection fraction is 35-45% by visual  estimate.  There is moderate left ventricular systolic dysfunction.  LV end diastolic pressure is mildly elevated.    Rapid redevelopment of in-stent restenosis in each of the 3 sites treated in September.  FFR across the mid LAD stent is 0.81. Note that there is significant distal LAD disease beyond this lesion which is likely causing the FFR value to be falsely elevated. In other words, the LAD FFR in the absence of distal disease could easily be less than 0.8.  Significant disease is noted in the mid PDA, mid and distal circumflex (ISR), small first obtuse marginal, large second obtuse (ISR) marginal, second diagonal (ISR), with intermediate angiographic stenosis in the mid LAD stent (ISR). The distal LAD proximal to the apex contains segmental 80% narrowing.  Global left ventricular dysfunction with regional variation. EF 35-45%.  RECOMMENDATIONS:   Consider surgical revascularization if possible.     Patient Profile     Mr. Gloster is a 70 year old male with a past medical history of CAD, NSTEMI on 9/6, this was due to in stent restenosis of distal LAD and L circumflex stentswhich were treated with balloon angioplasty only (no re-stenting). Other numerous stents were patent. Chronic Systolic and Diastolic CHF, PAF (paroxysmal atrial fibrillation), SSS (sick sinus syndrome Pacer present([employer] Zephyr XL DR 5826, dual chamber), LBBB (left bundle branch block), HTN, HLD, Diabetes Type 2 uncontrolled with Complications, OSA on CPAP, Nephrolithiasis. Had CABG on Wed. 09/15/16 after Plavix wash out.   Assessment & Plan    1. Day 6 s/p CABG 2. S/p PTCA for in-stent re-stenosis on 07/29/16 at which time plan was for triple therapy for 30 days, with plan to dc plavix and continue with baby ASA and  Xarelto.  No need to re-start plavix, since no stent was inserted at that procedure.  3. PAF:  On xarelto 4. Volume overload: continue with diuresis 5. Stage 3 CKD 6. OSA on CPAP; very  important to continue for 7- 8 hr duration nocturnally   Signed, Arbutus Leas, NP  09/21/2016, 11:05 AM    Patient seen and examined. Agree with assessment and plan. Pt is feeling better; mild edema LE bilaterally persists.  Pt had been a remote pt of Dr. Rollene Fare and has not seen a primary cardiologist since his retirement. He has seen Dr. [name] for EP.  I will  be happy to assume his general cardiology care as well as sleep apnea follow-up. He had shown me his prior med list which had him on prilosec and protonix, and recommended that he not take prilosic, since the two drugs are similar.    Troy Sine, MD, [facility] 09/21/2016 11:52 AM

## 2016-09-29 ENCOUNTER — Ambulatory Visit (INDEPENDENT_AMBULATORY_CARE_PROVIDER_SITE_OTHER): Payer: BLUE CROSS/BLUE SHIELD | Admitting: Nurse Practitioner

## 2016-09-29 ENCOUNTER — Encounter: Payer: Self-pay | Admitting: Nurse Practitioner

## 2016-09-29 VITALS — BP 100/60 | HR 82 | Ht 69.0 in | Wt 298.4 lb

## 2016-09-29 DIAGNOSIS — I2510 Atherosclerotic heart disease of native coronary artery without angina pectoris: Secondary | ICD-10-CM

## 2016-09-29 DIAGNOSIS — I480 Paroxysmal atrial fibrillation: Secondary | ICD-10-CM

## 2016-09-29 DIAGNOSIS — Z951 Presence of aortocoronary bypass graft: Secondary | ICD-10-CM | POA: Diagnosis not present

## 2016-09-29 DIAGNOSIS — I259 Chronic ischemic heart disease, unspecified: Secondary | ICD-10-CM

## 2016-09-29 DIAGNOSIS — Z79899 Other long term (current) drug therapy: Secondary | ICD-10-CM | POA: Diagnosis not present

## 2016-09-29 LAB — CBC
HCT: 29.3 % — ABNORMAL LOW (ref 38.5–50.0)
Hemoglobin: 9.5 g/dL — ABNORMAL LOW (ref 13.2–17.1)
MCH: 29.5 pg (ref 27.0–33.0)
MCHC: 32.4 g/dL (ref 32.0–36.0)
MCV: 91 fL (ref 80.0–100.0)
MPV: 9.1 fL (ref 7.5–12.5)
Platelets: 372 10*3/uL (ref 140–400)
RBC: 3.22 MIL/uL — ABNORMAL LOW (ref 4.20–5.80)
RDW: 14.3 % (ref 11.0–15.0)
WBC: 10.9 10*3/uL — ABNORMAL HIGH (ref 3.8–10.8)

## 2016-09-29 LAB — BASIC METABOLIC PANEL
BUN: 23 mg/dL (ref 7–25)
CO2: 26 mmol/L (ref 20–31)
Calcium: 8.6 mg/dL (ref 8.6–10.3)
Chloride: 103 mmol/L (ref 98–110)
Creat: 1.41 mg/dL — ABNORMAL HIGH (ref 0.70–1.25)
Glucose, Bld: 87 mg/dL (ref 65–99)
Potassium: 4.9 mmol/L (ref 3.5–5.3)
Sodium: 137 mmol/L (ref 135–146)

## 2016-09-29 MED ORDER — FUROSEMIDE 40 MG PO TABS: 40.0000 mg | ORAL_TABLET | Freq: Two times a day (BID) | ORAL | 1 refills | Status: DC

## 2016-09-29 MED ORDER — CEPHALEXIN 500 MG PO CAPS: 500.0000 mg | ORAL_CAPSULE | Freq: Four times a day (QID) | ORAL | 0 refills | Status: DC

## 2016-09-29 MED ORDER — DICYCLOMINE HCL 10 MG PO CAPS: 10.0000 mg | ORAL_CAPSULE | Freq: Three times a day (TID) | ORAL | 3 refills | Status: DC | PRN

## 2016-09-29 NOTE — Patient Instructions (Addendum)
We will be checking the following labs today - BMET and CBC   Medication Instructions:    Continue with your current medicines. BUT  I am changing the Dicyclomine to use only if needed  I am stopping the Omeprazole  I am giving you a short course of antibiotics - Keflex 500 mg to take 4 times a day for 5 days  I am increasing your Lasix to 40 mg in the early AM and repeat early in the afternoon - I have sent this to the drug store if you need    Testing/Procedures To Be Arranged:  N/A  Follow-Up:   See me in 1 week   Other Special Instructions:   Walking 5 to 10 minutes a day  When in the shower - pour peroxide over the sternal incision - rinse - pat dry    If you need a refill on your cardiac medications before your next appointment, please call your pharmacy.   Call the [place] office at [phone] if you have any questions, problems or concerns.
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
## 2016-10-05 ENCOUNTER — Telehealth: Payer: Self-pay | Admitting: Nurse Practitioner

## 2016-10-05 NOTE — Telephone Encounter (Signed)
New message      Pt has an appt tomorrow. He is has a cold-----runny nose, congestion,phlegm in throat.  What OTC medication can he take?

## 2016-10-05 NOTE — Telephone Encounter (Signed)
S/w pt is agreeable to treatment plan.  Plain Mucinex. Pt feels ok for appointment tomorrow stated walked to mailbox which is [address]. .  Will send to [facility] to [place].

## 2016-10-05 NOTE — Telephone Encounter (Signed)
Agree 

## 2016-10-06 ENCOUNTER — Encounter: Payer: Self-pay | Admitting: Nurse Practitioner

## 2016-10-06 ENCOUNTER — Encounter (INDEPENDENT_AMBULATORY_CARE_PROVIDER_SITE_OTHER): Payer: Self-pay

## 2016-10-06 ENCOUNTER — Ambulatory Visit (INDEPENDENT_AMBULATORY_CARE_PROVIDER_SITE_OTHER): Payer: BLUE CROSS/BLUE SHIELD | Admitting: Nurse Practitioner

## 2016-10-06 VITALS — BP 98/62 | HR 77 | Ht 69.0 in | Wt 289.8 lb

## 2016-10-06 DIAGNOSIS — I259 Chronic ischemic heart disease, unspecified: Secondary | ICD-10-CM | POA: Diagnosis not present

## 2016-10-06 DIAGNOSIS — Z951 Presence of aortocoronary bypass graft: Secondary | ICD-10-CM | POA: Diagnosis not present

## 2016-10-06 DIAGNOSIS — E119 Type 2 diabetes mellitus without complications: Secondary | ICD-10-CM

## 2016-10-06 DIAGNOSIS — Z79899 Other long term (current) drug therapy: Secondary | ICD-10-CM | POA: Diagnosis not present

## 2016-10-06 MED ORDER — FUROSEMIDE 40 MG PO TABS: 40.0000 mg | ORAL_TABLET | Freq: Every day | ORAL | 1 refills | Status: DC
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
## 2016-10-06 NOTE — Patient Instructions (Addendum)
We will be checking the following labs today - BMET   Medication Instructions:    Continue with your current medicines. BUT  I am cutting the Lasix back to just one a day to take for one more week - then stop - ok to use then just as needed - for weight gain/swelling    Testing/Procedures To Be Arranged:  N/A  Follow-Up:   Will see about getting you to Dr. Claiborne Billings for primary cardiology  Will refer to Dr. Dwyane Dee for your diabetes    Other Special Instructions:   I have sent a message to cardiac rehab  May start driving as of next week    If you need a refill on your cardiac medications before your next appointment, please call your pharmacy.   Call the [place] office at [phone] if you have any questions, problems or concerns.

## 2016-10-07 LAB — BASIC METABOLIC PANEL
BUN: 31 mg/dL — ABNORMAL HIGH (ref 7–25)
CO2: 26 mmol/L (ref 20–31)
Calcium: 9.4 mg/dL (ref 8.6–10.3)
Chloride: 104 mmol/L (ref 98–110)
Creat: 1.3 mg/dL — ABNORMAL HIGH (ref 0.70–1.25)
Glucose, Bld: 46 mg/dL — ABNORMAL LOW (ref 65–99)
Potassium: 4.6 mmol/L (ref 3.5–5.3)
Sodium: 141 mmol/L (ref 135–146)

## 2016-10-10 DIAGNOSIS — E162 Hypoglycemia, unspecified: Secondary | ICD-10-CM | POA: Diagnosis not present

## 2016-10-11 ENCOUNTER — Telehealth: Payer: Self-pay | Admitting: Endocrinology

## 2016-10-11 ENCOUNTER — Telehealth (HOSPITAL_COMMUNITY): Payer: Self-pay | Admitting: Cardiac Rehabilitation

## 2016-10-11 DIAGNOSIS — E119 Type 2 diabetes mellitus without complications: Secondary | ICD-10-CM | POA: Diagnosis not present

## 2016-10-11 DIAGNOSIS — I2510 Atherosclerotic heart disease of native coronary artery without angina pectoris: Secondary | ICD-10-CM | POA: Diagnosis not present

## 2016-10-11 DIAGNOSIS — E11649 Type 2 diabetes mellitus with hypoglycemia without coma: Secondary | ICD-10-CM | POA: Diagnosis not present

## 2016-10-11 NOTE — Telephone Encounter (Signed)
We'll have to wait till [facility] can schedule him.  He is a new patient to me and will need to have him call his current physician for insulin adjustment

## 2016-10-11 NOTE — Telephone Encounter (Signed)
pc to pt to discuss enrolling in cardiac rehab. Pt reports he is currently having frequent hypoglycemia and prefers to wait until blood sugars stabilized. Pt has contacted his PCP and endocrinologist for evaluation. Will plan to contact pt next week to discuss scheduling. Pt also states he prefers [facility] location.  Pt verbalized understanding of plan.

## 2016-10-11 NOTE — Telephone Encounter (Signed)
Pt's wife called stating that pt has an appt on 11/01/16 and would like to know if he can see him sooner his blood sugars have been running low they have called EMS several times due to pt being unconscious. Pt has been complaining of dizziness and unsteady.  

## 2016-10-12 NOTE — Telephone Encounter (Signed)
Charles [name], see message. I contacted the patient and advised of message voicemail from Dr. Dwyane Dee.

## 2016-10-13 NOTE — Telephone Encounter (Signed)
Spoke with pt to get set up for 12/6

## 2016-10-27 ENCOUNTER — Encounter: Payer: Self-pay | Admitting: Endocrinology

## 2016-10-27 ENCOUNTER — Other Ambulatory Visit: Payer: Self-pay | Admitting: Cardiothoracic Surgery

## 2016-10-27 ENCOUNTER — Ambulatory Visit (INDEPENDENT_AMBULATORY_CARE_PROVIDER_SITE_OTHER): Payer: BLUE CROSS/BLUE SHIELD | Admitting: Endocrinology

## 2016-10-27 ENCOUNTER — Other Ambulatory Visit: Payer: Self-pay

## 2016-10-27 VITALS — BP 128/78 | HR 76 | Ht 69.0 in | Wt 282.0 lb

## 2016-10-27 DIAGNOSIS — Z794 Long term (current) use of insulin: Secondary | ICD-10-CM | POA: Diagnosis not present

## 2016-10-27 DIAGNOSIS — Z951 Presence of aortocoronary bypass graft: Secondary | ICD-10-CM

## 2016-10-27 DIAGNOSIS — E1165 Type 2 diabetes mellitus with hyperglycemia: Secondary | ICD-10-CM | POA: Diagnosis not present

## 2016-10-27 LAB — URINALYSIS, ROUTINE W REFLEX MICROSCOPIC
Bilirubin Urine: NEGATIVE
KETONES UR: NEGATIVE
NITRITE: POSITIVE — AB
Specific Gravity, Urine: 1.01 (ref 1.000–1.030)
URINE GLUCOSE: NEGATIVE
UROBILINOGEN UA: 0.2 (ref 0.0–1.0)
pH: 6 (ref 5.0–8.0)

## 2016-10-27 LAB — COMPREHENSIVE METABOLIC PANEL
ALBUMIN: 3.9 g/dL (ref 3.5–5.2)
ALK PHOS: 121 U/L — AB (ref 39–117)
ALT: 16 U/L (ref 0–53)
AST: 12 U/L (ref 0–37)
BUN: 20 mg/dL (ref 6–23)
CALCIUM: 9.4 mg/dL (ref 8.4–10.5)
CO2: 28 mEq/L (ref 19–32)
Chloride: 105 mEq/L (ref 96–112)
Creatinine, Ser: 1.46 mg/dL (ref 0.40–1.50)
GFR: 50.76 mL/min — AB (ref >60.00)
Glucose, Bld: 138 mg/dL — ABNORMAL HIGH (ref 70–99)
POTASSIUM: 4.9 meq/L (ref 3.5–5.1)
Sodium: 140 mEq/L (ref 135–145)
TOTAL PROTEIN: 7.7 g/dL (ref 6.0–8.3)
Total Bilirubin: 0.3 mg/dL (ref 0.2–1.2)

## 2016-10-27 LAB — MICROALBUMIN / CREATININE URINE RATIO
Creatinine,U: 94 mg/dL
MICROALB UR: 8.1 mg/dL — AB (ref 0.0–1.9)
Microalb Creat Ratio: 8.6 mg/g (ref 0.0–30.0)

## 2016-10-27 MED ORDER — METFORMIN HCL 500 MG PO TABS: ORAL_TABLET | ORAL | 3 refills | Status: DC

## 2016-10-27 MED ORDER — INSULIN ASPART 100 UNIT/ML ~~LOC~~ SOLN: SUBCUTANEOUS | 11 refills | Status: DC

## 2016-10-27 MED ORDER — INSULIN DEGLUDEC 100 UNIT/ML ~~LOC~~ SOPN: PEN_INJECTOR | SUBCUTANEOUS | 2 refills | Status: DC

## 2016-10-27 NOTE — Patient Instructions (Addendum)
  CHECKING blood sugar: Check blood sugar in the morning daily and at least once a day about 2 hours after any a few meals  TRESIBA insulin: This insulin provides blood sugar control for up to 24 hours.    Start with 35 UNITS daily and increase by 2 units every 3 days until the waking up sugars are under 130. Then continue the same dose.   If blood sugar is under 90 for 2 days in a row, reduce the dose by 2 units. Note that this insulin does not control the rise of blood sugar with meals   Add extra Metformin at dinner  NOVOLOG insulin: This needs to be taken within 15 minutes of your plan meal to cover the rise in blood sugar with food Start with 5 units before breakfast, 10 before lunch and 15 before supper Blood sugar with this insulin is going to target reading 2 hours after eating Glucose after eating should be between about 130-160   If blood sugars are consistently over 180 after any meal increase the Novolog by 2 units for that meal
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
## 2016-10-28 ENCOUNTER — Ambulatory Visit (INDEPENDENT_AMBULATORY_CARE_PROVIDER_SITE_OTHER): Payer: Self-pay | Admitting: Cardiothoracic Surgery

## 2016-10-28 ENCOUNTER — Encounter: Payer: Self-pay | Admitting: Cardiothoracic Surgery

## 2016-10-28 ENCOUNTER — Telehealth: Payer: Self-pay | Admitting: Endocrinology

## 2016-10-28 ENCOUNTER — Other Ambulatory Visit: Payer: Self-pay

## 2016-10-28 ENCOUNTER — Ambulatory Visit
Admission: RE | Admit: 2016-10-28 | Discharge: 2016-10-28 | Disposition: A | Discharge status: Home / Self Care | Payer: BLUE CROSS/BLUE SHIELD | Source: Ambulatory Visit | Attending: Cardiothoracic Surgery | Admitting: Cardiothoracic Surgery

## 2016-10-28 VITALS — BP 122/65 | HR 57 | Resp 20 | Ht 69.0 in | Wt 282.0 lb

## 2016-10-28 DIAGNOSIS — Z951 Presence of aortocoronary bypass graft: Secondary | ICD-10-CM

## 2016-10-28 DIAGNOSIS — R05 Cough: Secondary | ICD-10-CM | POA: Diagnosis not present

## 2016-10-28 MED ORDER — INSULIN ASPART 100 UNIT/ML ~~LOC~~ SOLN: SUBCUTANEOUS | 11 refills | Status: DC

## 2016-10-28 MED ORDER — METFORMIN HCL 500 MG PO TABS: ORAL_TABLET | ORAL | 3 refills | Status: DC

## 2016-10-28 MED ORDER — "INSULIN SYRINGE-NEEDLE U-100 30G X 1/2"" 0.5 ML MISC": 2 refills | Status: DC

## 2016-10-28 MED ORDER — INSULIN DEGLUDEC 100 UNIT/ML ~~LOC~~ SOPN: PEN_INJECTOR | SUBCUTANEOUS | 2 refills | Status: DC

## 2016-10-28 NOTE — Telephone Encounter (Signed)
Pt called in and said that he wasn't aware that the insulin being called in for him was in the vial.  Pt did state that he prefers the pens but if Dr. Dwyane Dee wants him to use the vials, he will need some syringes called into his pharmacy. Please advise.

## 2016-10-28 NOTE — Progress Notes (Signed)
[address]       ,[place] [address]             [phone]      Charles [name] [facility] Medical Record [id number] Date of Birth: 1946/10/12  Referring: Charles Hector, MD Primary Care: No PCP Per Patient  Chief Complaint:   POST OP FOLLOW UP 09/15/2016 OPERATIVE REPORT PREOPERATIVE DIAGNOSIS:  Unstable angina with severe three-vessel coronary artery disease and recent failed angioplasty x2. PostOP: Same PROCEDURE PERFORMED:  Coronary artery bypass grafting x4 with left internal mammary to the left anterior descending coronary artery, reverse saphenous vein graft to the diagonal coronary artery, reverse saphenous vein graft to the obtuse marginal coronary artery, reverse saphenous vein graft to the mid posterior descending coronary artery with right thigh and calf greater saphenous vein harvesting endoscopically, left side sternal plating. SURGEON:  Lanelle Bal, MD.  History of Present Illness:     Patient returns to the office today in follow-up after his coronary artery bypass grafting in late October. His postoperative course has been complicated by episodes of hypoglycemia requiring EMS to come to his house. He also notes that his low dose lisinopril is causing a cough. He has stopped the lisinopril and the cough is resolved. He does continue on Xarelto     Past Medical History:  Diagnosis Date  . Arthritis    "knees, hands, lower back" (07/29/2016)  . Asthma    "touch q once & awhile" (02/05/2016)  . Chronic bronchitis ([place])   . Chronic venous insufficiency    with prior venous stasis ulcers  . Complication of anesthesia    "when coming out, I choke and get very restless if breathing tube is still in"  . Coronary artery disease    last cath 2008,  remote PCI of the LAD with Lcx/OM bifurcation stenting and proximal RCA stenting  . Diabetes mellitus, type II ([place])    AODM  . Dyslipidemia   . Exogenous obesity    severe  . History  of blood transfusion ~ 2015   related to "when they went in to get my kidney stones"  . Hx of colonic polyps 09/2006   inflammatory polyp at hepatic flexure. not adenomatous or malignant.   . Hypertension   . LBBB (left bundle branch block)    He has developed a native LBBB which was seen on his last visit of March 2014 (From OV note 07/03/13)   . Long term (current) use of anticoagulants   . MI (myocardial infarction) 1995   "mild"  . Nephrolithiasis   . OSA on CPAP    "nasal CPAP" (07/29/2016)  . Osteoarthritis, knee   . PAF (paroxysmal atrial fibrillation) ([place])   . 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 Zephyr XL DR 5826, dual chamber, rate responsive. No arrhythmias recorded and he has an excellent threshold.  . SSS (sick sinus syndrome) ([place])   . Statin intolerance    Hx of. Now tolerating Zetia & Livalo well.   [name] Unstable angina ([place]) 08/2016     History  Smoking Status  . Former Smoker  . Packs/day: 1.00  . Years: 5.00  . Types: Cigarettes  . Quit date: 11/22/1974  Smokeless Tobacco  . Never Used    History  Alcohol Use  . 0.0 oz/week    Comment: 02/05/2016 "maybe 6 pack of beer/month"; 07/29/2016 "I've totally given up all alcohol"     Allergies  Allergen Reactions  . Chocolate Hives, Shortness Of Breath and Swelling  . Statins Other (See Comments)    Mental changes, muscle aches  . Black Pepper [[place]] Hives  . Codeine Itching  . Oxytetracycline Other (See Comments)    Flushing in sunlight  . Tape Rash and Other (See Comments)    SKIN IS VERY SENSITIVE!!    Current Outpatient Prescriptions  Medication Sig Dispense Refill  . alfuzosin (UROXATRAL) 10 MG 24 hr tablet Take 10 mg by mouth daily with breakfast.    . aspirin EC 81 MG EC tablet Take 1 tablet (81 mg total) by mouth daily.    [name] dicyclomine (BENTYL) 10 MG capsule Take 1-2 capsules (10-20 mg total) by mouth 3 (three) times daily as needed for spasms. 360 capsule 3  . furosemide  (LASIX) 40 MG tablet Take 1 tablet (40 mg total) by mouth daily. Daily for one week - then changing to just as needed - prn weight gain/swelling 30 tablet 1  . guaiFENesin (MUCINEX) 600 MG 12 hr tablet Take 600 mg by mouth 2 (two) times daily.    . insulin aspart (NOVOLOG) 100 UNIT/ML injection Inject 5-15 units in the skin before meals 10 mL 11  . insulin degludec (TRESIBA FLEXTOUCH) 100 UNIT/ML SOPN FlexTouch Pen Inject 35 units in the skin daily 15 mL 2  . Insulin Regular Human (HUMULIN R U-500 KWIKPEN [place]) Inject 50 Units into the skin 2 (two) times daily.     [name] lisinopril (PRINIVIL,ZESTRIL) 5 MG tablet Take 1 tablet (5 mg total) by mouth daily. 30 tablet 1  . metFORMIN (GLUCOPHAGE) 500 MG tablet Take 3 tablets daily 90 tablet 3  . oxyCODONE (OXY IR/ROXICODONE) 5 MG immediate release tablet Take 1-2 tablets (5-10 mg total) by mouth every 6 (six) hours as needed for severe pain. 28 tablet 0  . pantoprazole (PROTONIX) 40 MG tablet Take 1 tablet (40 mg total) by mouth daily. (Patient taking differently: Take 40 mg by mouth every morning. ) 30 tablet 11  . rivaroxaban (XARELTO) 20 MG TABS tablet Take 1 tablet (20 mg total) by mouth daily with supper. 30 tablet 11  . rosuvastatin (CRESTOR) 5 MG tablet Take 1 tablet (5 mg total) by mouth every Monday, Wednesday, and Friday. 30 tablet 1  . sotalol (BETAPACE) 120 MG tablet Take 1 tablet (120 mg total) by mouth 2 (two) times daily. 60 tablet 10  . VENTOLIN HFA 108 (90 Base) MCG/ACT inhaler   0   No current facility-administered medications for this visit.        Physical Exam: BP 122/65   Pulse (!) 57   Resp 20   Ht 5\' 9"  (1.753 m)   Wt 282 lb (127.9 kg)   SpO2 99% Comment: RA  BMI 41.64 kg/m   General appearance: alert and cooperative Neurologic: intact Heart: regular rate and rhythm, S1, S2 normal, no murmur, click, rub or gallop Lungs: diminished breath sounds bibasilar Abdomen: soft, non-tender; bowel sounds normal; no masses,  no  organomegaly Extremities: Patient has mild bilateral pedal edema right greater than left, as a small eschar at the lower end of vein harvest site in the right leg but without infection Wound: Sternal wound is stable and well healed   Diagnostic Studies & Laboratory data:     Recent Radiology Findings:   Dg Chest 2 View  Result Date: 10/28/2016 CLINICAL DATA:  Status post CABG 09/15/2016.  Mild cough. EXAM: CHEST  2 VIEW COMPARISON:  PA and  lateral chest 09/19/2016. FINDINGS: Seven intact median sternotomy wires are unchanged. There is cardiomegaly without edema. Pacing device is in place. There is a trace left pleural effusion with mild basilar atelectasis both of which are improved since the prior exam. No right pleural effusion. No pneumothorax. IMPRESSION: Trace left pleural effusion and mild basilar atelectasis have improved since the prior exam. No new abnormality. Electronically Signed   By: Inge Rise M.D.   On: 10/28/2016 11:20      Recent Lab Findings: Lab Results  Component Value Date   WBC 10.9 (H) 09/29/2016   HGB 9.5 (L) 09/29/2016   HCT 29.3 (L) 09/29/2016   PLT 372 09/29/2016   GLUCOSE 138 (H) 10/27/2016   CHOL 122 (L) 08/17/2016   TRIG 133 08/17/2016   HDL 42 08/17/2016   LDLCALC 53 08/17/2016   ALT 16 10/27/2016   AST 12 10/27/2016   NA 140 10/27/2016   K 4.9 10/27/2016   CL 105 10/27/2016   CREATININE 1.46 10/27/2016   BUN 20 10/27/2016   CO2 28 10/27/2016   TSH 1.951 07/29/2016   INR 1.31 09/15/2016   HGBA1C 6.3 (H) 09/14/2016      Assessment / Plan:    1  CAD with recent CABG x 4 -slowly making progress, to starting cardiac rehabilitation early next week . Establish with Dr. Claiborne Billings for general cardiology care.  2. Sick sinus syndrome - PPM in place - followed by Dr. [name] 3. Chronic systolic dysfunction (EF 0000000 about 35 to 45% by recent cath - around 45% by TEE -  4. Afib - remains on Sotalol.  chads2vasc score is at least 5.  He  is now on Xarelto -  5. OSA Compliance with CPAP encouraged (he is compliant) 6. Hypertension - BP ok on current regimen - still soft - really on just low dose ACE - will follow - hold on adding back HCTZ.  7. HLD- on low dose Crestor.  8. DM - he has seen Dr. Dwyane Dee yesterday and adjustments to his diabetes attic regiment have been made.   Plan to see the patient back as needed  Grace Isaac MD      [address] [place] [address] Office [phone]   Beeper [phone]  10/28/2016 12:46 PM

## 2016-10-28 NOTE — Telephone Encounter (Signed)
Spoke with patient and we fixed the problem with his prescriptions

## 2016-10-29 ENCOUNTER — Telehealth: Payer: Self-pay | Admitting: Endocrinology

## 2016-11-01 ENCOUNTER — Ambulatory Visit: Payer: BLUE CROSS/BLUE SHIELD | Admitting: Endocrinology

## 2016-11-02 ENCOUNTER — Encounter (HOSPITAL_COMMUNITY)
Admission: RE | Admit: 2016-11-02 | Discharge: 2016-11-02 | Disposition: A | Discharge status: Home / Self Care | Payer: BLUE CROSS/BLUE SHIELD | Source: Ambulatory Visit | Attending: Cardiovascular Disease | Admitting: Cardiovascular Disease

## 2016-11-02 VITALS — BP 98/64 | HR 85 | Ht 69.0 in | Wt 280.9 lb

## 2016-11-02 DIAGNOSIS — Z794 Long term (current) use of insulin: Secondary | ICD-10-CM | POA: Insufficient documentation

## 2016-11-02 DIAGNOSIS — Z79899 Other long term (current) drug therapy: Secondary | ICD-10-CM | POA: Insufficient documentation

## 2016-11-02 DIAGNOSIS — Z87891 Personal history of nicotine dependence: Secondary | ICD-10-CM | POA: Diagnosis not present

## 2016-11-02 DIAGNOSIS — Z7982 Long term (current) use of aspirin: Secondary | ICD-10-CM | POA: Diagnosis not present

## 2016-11-02 DIAGNOSIS — Z951 Presence of aortocoronary bypass graft: Secondary | ICD-10-CM | POA: Diagnosis not present

## 2016-11-02 DIAGNOSIS — Z7901 Long term (current) use of anticoagulants: Secondary | ICD-10-CM | POA: Diagnosis not present

## 2016-11-02 NOTE — Progress Notes (Signed)
Cardiac Individual Treatment Plan  Patient Details  Name: Charles [name] MRN: [id number] Date of Birth: May 05, 1946 Referring Provider:   Flowsheet Row CARDIAC REHAB PHASE II ORIENTATION from 11/02/2016 in [facility]  Referring Provider  Shelva Majestic MD      Initial Encounter Date:  [place] PHASE II ORIENTATION from 11/02/2016 in [facility]  Date  11/02/16  Referring Provider  Shelva Majestic MD      Visit Diagnosis: S/P CABG x 4  Patient's Home Medications on Admission:  Current Outpatient Prescriptions:  .  alfuzosin (UROXATRAL) 10 MG 24 hr tablet, Take 10 mg by mouth daily with breakfast., Disp: , Rfl:  .  aspirin EC 81 MG EC tablet, Take 1 tablet (81 mg total) by mouth daily., Disp: , Rfl:  .  furosemide (LASIX) 40 MG tablet, Take 1 tablet (40 mg total) by mouth daily. Daily for one week - then changing to just as needed - prn weight gain/swelling, Disp: 30 tablet, Rfl: 1 .  guaiFENesin (MUCINEX) 600 MG 12 hr tablet, Take 600 mg by mouth 2 (two) times daily as needed. , Disp: , Rfl:  .  insulin aspart (NOVOLOG) 100 UNIT/ML injection, Inject 5-15 units in the skin before meals, Disp: 10 mL, Rfl: 11 .  insulin degludec (TRESIBA FLEXTOUCH) 100 UNIT/ML SOPN FlexTouch Pen, Inject 35 units in the skin daily (Patient taking differently: Inject 37 Units into the skin daily. Inject 35 units in the skin daily), Disp: 5 pen, Rfl: 2 .  Insulin Syringe-Needle U-100 (B-D INS SYRINGE 0.5CC/30GX1/2") 30G X 1/2" 0.5 ML MISC, Use to inject insulin 3 times daily, Disp: 100 each, Rfl: 2 .  lisinopril (PRINIVIL,ZESTRIL) 5 MG tablet, Take 1 tablet (5 mg total) by mouth daily., Disp: 30 tablet, Rfl: 1 .  metFORMIN (GLUCOPHAGE) 500 MG tablet, Take 3 tablets daily, Disp: 90 tablet, Rfl: 3 .  oxyCODONE (OXY IR/ROXICODONE) 5 MG immediate release tablet, Take 1-2 tablets (5-10 mg total) by mouth every 6 (six) hours as needed  for severe pain., Disp: 28 tablet, Rfl: 0 .  rivaroxaban (XARELTO) 20 MG TABS tablet, Take 1 tablet (20 mg total) by mouth daily with supper., Disp: 30 tablet, Rfl: 11 .  rosuvastatin (CRESTOR) 5 MG tablet, Take 1 tablet (5 mg total) by mouth every Monday, Wednesday, and Friday., Disp: 30 tablet, Rfl: 1 .  sotalol (BETAPACE) 120 MG tablet, Take 1 tablet (120 mg total) by mouth 2 (two) times daily., Disp: 60 tablet, Rfl: 10 .  VENTOLIN HFA 108 (90 Base) MCG/ACT inhaler, Inhale 2 puffs into the lungs 2 (two) times daily as needed. , Disp: , Rfl: 0 .  dicyclomine (BENTYL) 10 MG capsule, Take 1-2 capsules (10-20 mg total) by mouth 3 (three) times daily as needed for spasms. (Patient not taking: Reported on 11/02/2016), Disp: 360 capsule, Rfl: 3 .  Insulin Regular Human (HUMULIN R U-500 KWIKPEN [place]), Inject 50 Units into the skin 2 (two) times daily. , Disp: , Rfl:  .  pantoprazole (PROTONIX) 40 MG tablet, Take 1 tablet (40 mg total) by mouth daily. (Patient not taking: Reported on 11/02/2016), Disp: 30 tablet, Rfl: 11  Past Medical History: Past Medical History:  Diagnosis Date  . Arthritis    "knees, hands, lower back" (07/29/2016)  . Asthma    "touch q once & awhile" (02/05/2016)  . Chronic bronchitis ([place])   . Chronic venous insufficiency    with prior venous stasis  ulcers  . Complication of anesthesia    "when coming out, I choke and get very restless if breathing tube is still in"  . Coronary artery disease    last cath 2008,  remote PCI of the LAD with Lcx/OM bifurcation stenting and proximal RCA stenting  . Diabetes mellitus, type II ([place])    AODM  . Dyslipidemia   . Exogenous obesity    severe  . History of blood transfusion ~ 2015   related to "when they went in to get my kidney stones"  . Hx of colonic polyps 09/2006   inflammatory polyp at hepatic flexure. not adenomatous or malignant.   . Hypertension   . LBBB (left bundle branch block)    He has developed a native LBBB which  was seen on his last visit of March 2014 (From OV note 07/03/13)   . Long term (current) use of anticoagulants   . MI (myocardial infarction) 1995   "mild"  . Nephrolithiasis   . OSA on CPAP    "nasal CPAP" (07/29/2016)  . Osteoarthritis, knee   . PAF (paroxysmal atrial fibrillation) ([place])   . 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 Zephyr XL DR 5826, dual chamber, rate responsive. No arrhythmias recorded and he has an excellent threshold.  . SSS (sick sinus syndrome) ([place])   . Statin intolerance    Hx of. Now tolerating Zetia & Livalo well.   [name] Unstable angina ([place]) 08/2016    Tobacco Use: History  Smoking Status  . Former Smoker  . Packs/day: 1.00  . Years: 5.00  . Types: Cigarettes  . Quit date: 11/22/1974  Smokeless Tobacco  . Never Used    Labs: Recent Review Flowsheet Data    Labs for ITP Cardiac and Pulmonary Rehab Latest Ref Rng & Units 09/15/2016 09/15/2016 09/15/2016 09/15/2016 09/16/2016   Cholestrol 125 - 200 mg/dL - - - - -   LDLCALC <130 mg/dL - - - - -   HDL >=40 mg/dL - - - - -   Trlycerides <150 mg/dL - - - - -   Hemoglobin A1c 4.8 - 5.6 % - - - - -   PHART 7.350 - 7.450 7.310(L) 7.385 7.389 - -   PCO2ART 32.0 - 48.0 mmHg 41.1 40.1 37.5 - -   HCO3 20.0 - 28.0 mmol/L 21.0 24.1 22.8 - -   TCO2 0 - 100 mmol/L 22 25 24 23 22    ACIDBASEDEF 0.0 - 2.0 mmol/L 5.0(H) 1.0 2.0 - -   O2SAT % 93.0 98.0 95.0 - -      Capillary Blood Glucose: Lab Results  Component Value Date   GLUCAP 124 (H) 09/21/2016   GLUCAP 234 (H) 09/20/2016   GLUCAP 248 (H) 09/20/2016   GLUCAP 157 (H) 09/20/2016   GLUCAP 130 (H) 09/20/2016     Exercise Target Goals: Date: 11/02/16  Exercise Program Goal: Individual exercise prescription set with THRR, safety & activity barriers. Participant demonstrates ability to understand and report RPE using BORG scale, to self-measure pulse accurately, and to acknowledge the importance of the exercise prescription.  Exercise  Prescription Goal: Starting with aerobic activity 30 plus minutes a day, 3 days per week for initial exercise prescription. Provide home exercise prescription and guidelines that participant acknowledges understanding prior to discharge.  Activity Barriers & Risk Stratification:     Activity Barriers & Cardiac Risk Stratification - 11/02/16 1423      Activity Barriers & Cardiac Risk Stratification   Activity  Barriers Arthritis;Back Problems;Joint Problems;Other (comment)   Comments "bad" rotator cuff right shoulder, OA lower back, bilateral arthroscopic knee surgery   Cardiac Risk Stratification High      6 Minute Walk:     6 Minute Walk    Row Name 11/02/16 1648         6 Minute Walk   Phase Initial     Distance 845 feet     Walk Time 6 minutes     # of Rest Breaks 0     MPH 1.6     METS 1.16     RPE 11     VO2 Peak 4.05     Symptoms Yes (comment)     Comments hip and glute fatigue     Resting HR 85 bpm     Resting BP 98/64     Max Ex. HR 89 bpm     Max Ex. BP 120/60     2 Minute Post BP 106/70        Initial Exercise Prescription:     Initial Exercise Prescription - 11/02/16 1600      Date of Initial Exercise RX and Referring Provider   Date 11/02/16   Referring Provider Shelva Majestic MD     Treadmill   MPH 1.7   Grade 0   Minutes 10   METs 2.3     Bike   Level 0.3   Minutes 10   METs 1.45     NuStep   Level 2   Minutes 10   METs 2     Track   Laps --   Minutes --   METs --     Prescription Details   Frequency (times per week) 3   Duration Progress to 30 minutes of continuous aerobic without signs/symptoms of physical distress     Intensity   THRR 40-80% of Max Heartrate 60-121   Ratings of Perceived Exertion 11-13     Progression   Progression Continue progressive overload as per policy without signs/symptoms or physical distress.     Resistance Training   Training Prescription Yes   Weight 2lbs   Reps 10-12      Perform  Capillary Blood Glucose checks as needed.  Exercise Prescription Changes:   Exercise Comments:   Discharge Exercise Prescription (Final Exercise Prescription Changes):   Nutrition:  Target Goals: Understanding of nutrition guidelines, daily intake of sodium 1500mg , cholesterol 200mg , calories 30% from fat and 7% or less from saturated fats, daily to have 5 or more servings of fruits and vegetables.  Biometrics:     Pre Biometrics - 11/02/16 1652      Pre Biometrics   Waist Circumference 48.75 inches   Hip Circumference 51.25 inches   Waist to Hip Ratio 0.95 %   Triceps Skinfold 26 mm   % Body Fat 39.1 %   Grip Strength 42.5 kg   Flexibility 9 in   Single Leg Stand 1.56 seconds       Nutrition Therapy Plan and Nutrition Goals:   Nutrition Discharge: Nutrition Scores:   Nutrition Goals Re-Evaluation:   Psychosocial: Target Goals: Acknowledge presence or absence of depression, maximize coping skills, provide positive support system. Participant is able to verbalize types and ability to use techniques and skills needed for reducing stress and depression.  Initial Review & Psychosocial Screening:     Initial Psych Review & Screening - 11/02/16 [place]? Yes  wife      Barriers   Psychosocial barriers to participate in program There are no identifiable barriers or psychosocial needs.     Screening Interventions   Interventions Encouraged to exercise      Quality of Life Scores:     Quality of Life - 11/02/16 1641      Quality of Life Scores   Health/Function Pre 9.1 %   Socioeconomic Pre 21.06 %   Psych/Spiritual Pre 17.79 %   Family Pre 13.2 %   GLOBAL Pre 14.16 %      PHQ-9: Recent Review Flowsheet Data    There is no flowsheet data to display.      Psychosocial Evaluation and Intervention:   Psychosocial Re-Evaluation:   Vocational Rehabilitation: Provide vocational rehab assistance to  qualifying candidates.   Vocational Rehab Evaluation & Intervention:     Vocational Rehab - 11/02/16 1711      Initial Vocational Rehab Evaluation & Intervention   Assessment shows need for Vocational Rehabilitation No  pt did not complete form. will give to pt at next scheduled appt to complete       Education: Education Goals: Education classes will be provided on a weekly basis, covering required topics. Participant will state understanding/return demonstration of topics presented.  Learning Barriers/Preferences:     Learning Barriers/Preferences - 11/02/16 1648      Learning Barriers/Preferences   Learning Barriers Sight;Hearing   Learning Preferences None      Education Topics: Count Your Pulse:  -Group instruction provided by verbal instruction, demonstration, patient participation and written materials to support subject.  Instructors address importance of being able to find your pulse and how to count your pulse when at home without a heart monitor.  Patients get hands on experience counting their pulse with staff help and individually.   Heart Attack, Angina, and Risk Factor Modification:  -Group instruction provided by verbal instruction, video, and written materials to support subject.  Instructors address signs and symptoms of angina and heart attacks.    Also discuss risk factors for heart disease and how to make changes to improve heart health risk factors.   Functional Fitness:  -Group instruction provided by verbal instruction, demonstration, patient participation, and written materials to support subject.  Instructors address safety measures for doing things around the house.  Discuss how to get up and down off the floor, how to pick things up properly, how to safely get out of a chair without assistance, and balance training.   Meditation and Mindfulness:  -Group instruction provided by verbal instruction, patient participation, and written materials to  support subject.  Instructor addresses importance of mindfulness and meditation practice to help reduce stress and improve awareness.  Instructor also leads participants through a meditation exercise.    Stretching for Flexibility and Mobility:  -Group instruction provided by verbal instruction, patient participation, and written materials to support subject.  Instructors lead participants through series of stretches that are designed to increase flexibility thus improving mobility.  These stretches are additional exercise for major muscle groups that are typically performed during regular warm up and cool down.   Hands Only CPR Anytime:  -Group instruction provided by verbal instruction, video, patient participation and written materials to support subject.  Instructors co-teach with AHA video for hands only CPR.  Participants get hands on experience with mannequins.   Nutrition I class: Heart Healthy Eating:  -Group instruction provided by PowerPoint slides, verbal discussion, and written materials to support subject matter. The  instructor gives an explanation and review of the Therapeutic Lifestyle Changes diet recommendations, which includes a discussion on lipid goals, dietary fat, sodium, fiber, plant stanol/sterol esters, sugar, and the components of a well-balanced, healthy diet.   Nutrition II class: Lifestyle Skills:  -Group instruction provided by PowerPoint slides, verbal discussion, and written materials to support subject matter. The instructor gives an explanation and review of label reading, grocery shopping for heart health, heart healthy recipe modifications, and ways to make healthier choices when eating out.   Diabetes Question & Answer:  -Group instruction provided by PowerPoint slides, verbal discussion, and written materials to support subject matter. The instructor gives an explanation and review of diabetes co-morbidities, pre- and post-prandial blood glucose goals,  pre-exercise blood glucose goals, signs, symptoms, and treatment of hypoglycemia and hyperglycemia, and foot care basics.   Diabetes Blitz:  -Group instruction provided by PowerPoint slides, verbal discussion, and written materials to support subject matter. The instructor gives an explanation and review of the physiology behind type 1 and type 2 diabetes, diabetes medications and rational behind using different medications, pre- and post-prandial blood glucose recommendations and Hemoglobin A1c goals, diabetes diet, and exercise including blood glucose guidelines for exercising safely.    Portion Distortion:  -Group instruction provided by PowerPoint slides, verbal discussion, written materials, and food models to support subject matter. The instructor gives an explanation of serving size versus portion size, changes in portions sizes over the last 20 years, and what consists of a serving from each food group.   Stress Management:  -Group instruction provided by verbal instruction, video, and written materials to support subject matter.  Instructors review role of stress in heart disease and how to cope with stress positively.     Exercising on Your Own:  -Group instruction provided by verbal instruction, power point, and written materials to support subject.  Instructors discuss benefits of exercise, components of exercise, frequency and intensity of exercise, and end points for exercise.  Also discuss use of nitroglycerin and activating EMS.  Review options of places to exercise outside of rehab.  Review guidelines for sex with heart disease.   Cardiac Drugs I:  -Group instruction provided by verbal instruction and written materials to support subject.  Instructor reviews cardiac drug classes: antiplatelets, anticoagulants, beta blockers, and statins.  Instructor discusses reasons, side effects, and lifestyle considerations for each drug class.   Cardiac Drugs II:  -Group instruction  provided by verbal instruction and written materials to support subject.  Instructor reviews cardiac drug classes: angiotensin converting enzyme inhibitors (ACE-I), angiotensin II receptor blockers (ARBs), nitrates, and calcium channel blockers.  Instructor discusses reasons, side effects, and lifestyle considerations for each drug class.   Anatomy and Physiology of the Circulatory System:  -Group instruction provided by verbal instruction, video, and written materials to support subject.  Reviews functional anatomy of heart, how it relates to various diagnoses, and what role the heart plays in the overall system.   Knowledge Questionnaire Score:     Knowledge Questionnaire Score - 11/02/16 1641      Knowledge Questionnaire Score   Pre Score 20/24      Core Components/Risk Factors/Patient Goals at Admission:     Personal Goals and Risk Factors at Admission - 11/02/16 1712      Core Components/Risk Factors/Patient Goals on Admission    Weight Management Obesity;Yes   Intervention Weight Management: Develop a combined nutrition and exercise program designed to reach desired caloric intake, while maintaining appropriate intake of nutrient  and fiber, sodium and fats, and appropriate energy expenditure required for the weight goal.;Weight Management: Provide education and appropriate resources to help participant work on and attain dietary goals.;Obesity: Provide education and appropriate resources to help participant work on and attain dietary goals.   Admit Weight 280 lb 13.9 oz (127.4 kg)   Expected Outcomes Short Term: Continue to assess and modify interventions until short term weight is achieved;Long Term: Adherence to nutrition and physical activity/exercise program aimed toward attainment of established weight goal   Diabetes Yes   Intervention Provide education about signs/symptoms and action to take for hypo/hyperglycemia.;Provide education about proper nutrition, including  hydration, and aerobic/resistive exercise prescription along with prescribed medications to achieve blood glucose in normal ranges: Fasting glucose 65-99 mg/dL   Expected Outcomes Short Term: Participant verbalizes understanding of the signs/symptoms and immediate care of hyper/hypoglycemia, proper foot care and importance of medication, aerobic/resistive exercise and nutrition plan for blood glucose control.;Long Term: Attainment of HbA1C < 7%.   Lipids Yes   Intervention Provide education and support for participant on nutrition & aerobic/resistive exercise along with prescribed medications to achieve LDL 70mg , HDL >40mg .   Expected Outcomes Short Term: Participant states understanding of desired cholesterol values and is compliant with medications prescribed. Participant is following exercise prescription and nutrition guidelines.;Long Term: Cholesterol controlled with medications as prescribed, with individualized exercise RX and with personalized nutrition plan. Value goals: LDL < 70mg , HDL > 40 mg.   Personal Goal Other Yes   Personal Goal Short Term:  Increase metobolism and lose weight.  Long Term:  Live longer, Goal Weight:  225lb,   Intervention provide exercise, nutrition and risk factor reduction education classes to promote life style modification   Expected Outcomes pt will participate with individualized exercise RX and personalized nutrition plan to continue striving towards weight loss goals       Core Components/Risk Factors/Patient Goals Review:    Core Components/Risk Factors/Patient Goals at Discharge (Final Review):    ITP Comments:     ITP Comments    Row Name 11/02/16 1346           ITP Comments Medical Director- Dr. Fransico Him, MD.          Comments: Patient attended orientation from 1330 to 1530 to review rules and guidelines for program. Completed 6 minute walk test, Intitial ITP, and exercise prescription.  VSS. Telemetry-sinus rhythm, Bundle Branch  Block, occasional PVC.  Asymptomatic.

## 2016-11-02 NOTE — Progress Notes (Signed)
Cardiac Rehab Medication Review by a Pharmacist  Does the patient  feel that his/her medications are working for him/her? Yes   Has the patient been experiencing any side effects to the medications prescribed? No   Does the patient measure his/her own blood pressure or blood glucose at home? Yes   Does the patient have any problems obtaining medications due to transportation or finances? No   Understanding of regimen: good  Understanding of indications: good Potential of compliance: good   Pharmacist comments: Patient presents ambulating unassisted. Current medications including dosing and frequency updated in chart and reviewed with patient. Patient was offered time for discussion with questions. Patient mentioned odd taste in mouth for about 15 minutes after taking medications. He reports goes away on its own every day. Patient also mentions cough which he was concerned was medication-related, but has improved recently. Verified with patient that he knew how to use inhaler, if needed and he is planning to discuss cough if it worsens at next PCP appointment. No further questions at the conclusion of our appointment.   Argie Ramming, PharmD Pharmacy Resident  Pager [phone] 11/02/16 3:00 PM

## 2016-11-08 ENCOUNTER — Encounter (HOSPITAL_COMMUNITY): Payer: BLUE CROSS/BLUE SHIELD

## 2016-11-08 ENCOUNTER — Ambulatory Visit (INDEPENDENT_AMBULATORY_CARE_PROVIDER_SITE_OTHER): Payer: BLUE CROSS/BLUE SHIELD | Admitting: Cardiovascular Disease

## 2016-11-08 ENCOUNTER — Encounter: Payer: Self-pay | Admitting: Cardiovascular Disease

## 2016-11-08 VITALS — BP 112/70 | HR 76 | Ht 69.0 in | Wt 279.5 lb

## 2016-11-08 DIAGNOSIS — Z7901 Long term (current) use of anticoagulants: Secondary | ICD-10-CM

## 2016-11-08 DIAGNOSIS — G4733 Obstructive sleep apnea (adult) (pediatric): Secondary | ICD-10-CM

## 2016-11-08 DIAGNOSIS — E669 Obesity, unspecified: Secondary | ICD-10-CM

## 2016-11-08 DIAGNOSIS — I480 Paroxysmal atrial fibrillation: Secondary | ICD-10-CM | POA: Diagnosis not present

## 2016-11-08 DIAGNOSIS — I2510 Atherosclerotic heart disease of native coronary artery without angina pectoris: Secondary | ICD-10-CM | POA: Diagnosis not present

## 2016-11-08 DIAGNOSIS — Z951 Presence of aortocoronary bypass graft: Secondary | ICD-10-CM

## 2016-11-08 DIAGNOSIS — I10 Essential (primary) hypertension: Secondary | ICD-10-CM | POA: Diagnosis not present

## 2016-11-08 DIAGNOSIS — E1169 Type 2 diabetes mellitus with other specified complication: Secondary | ICD-10-CM

## 2016-11-08 DIAGNOSIS — I255 Ischemic cardiomyopathy: Secondary | ICD-10-CM

## 2016-11-08 DIAGNOSIS — D62 Acute posthemorrhagic anemia: Secondary | ICD-10-CM

## 2016-11-08 NOTE — Patient Instructions (Signed)
Your physician recommends that you return for lab work in:  Fasting.  Your physician recommends that you schedule a follow-up appointment in: 3 months with Dr Claiborne Billings

## 2016-11-09 ENCOUNTER — Telehealth: Payer: Self-pay | Admitting: Endocrinology

## 2016-11-09 NOTE — Telephone Encounter (Signed)
Pt called in inquiring about his lab results.  Requests call back.
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
## 2016-11-10 ENCOUNTER — Encounter (HOSPITAL_COMMUNITY): Payer: BLUE CROSS/BLUE SHIELD

## 2016-11-10 ENCOUNTER — Encounter (HOSPITAL_COMMUNITY)
Admission: RE | Admit: 2016-11-10 | Discharge: 2016-11-10 | Disposition: A | Discharge status: Home / Self Care | Payer: BLUE CROSS/BLUE SHIELD | Source: Ambulatory Visit | Attending: Cardiovascular Disease | Admitting: Cardiovascular Disease

## 2016-11-10 DIAGNOSIS — Z951 Presence of aortocoronary bypass graft: Secondary | ICD-10-CM

## 2016-11-10 DIAGNOSIS — Z7901 Long term (current) use of anticoagulants: Secondary | ICD-10-CM | POA: Diagnosis not present

## 2016-11-10 DIAGNOSIS — Z87891 Personal history of nicotine dependence: Secondary | ICD-10-CM | POA: Diagnosis not present

## 2016-11-10 DIAGNOSIS — Z7982 Long term (current) use of aspirin: Secondary | ICD-10-CM | POA: Diagnosis not present

## 2016-11-10 DIAGNOSIS — Z79899 Other long term (current) drug therapy: Secondary | ICD-10-CM | POA: Diagnosis not present

## 2016-11-10 DIAGNOSIS — Z794 Long term (current) use of insulin: Secondary | ICD-10-CM | POA: Diagnosis not present

## 2016-11-10 LAB — GLUCOSE, CAPILLARY
Glucose-Capillary: 161 mg/dL — ABNORMAL HIGH (ref 65–99)
Glucose-Capillary: 170 mg/dL — ABNORMAL HIGH (ref 65–99)

## 2016-11-10 NOTE — Progress Notes (Signed)
Daily Session Note  Patient Details  Name: Charles [name] MRN: [id number] Date of Birth: 1946-04-30 Referring Provider:   Flowsheet Row CARDIAC REHAB PHASE II ORIENTATION from 11/02/2016 in [facility]  Referring Provider  Shelva Majestic MD      Encounter Date: 11/10/2016  Check In:     Session Check In - 11/10/16 1700      Check-In   Location [facility]   Staff Present Cleda Mccreedy, MS, Exercise Physiologist;Amber Fair, MS, ACSM RCEP, Exercise Physiologist;Joann Rion, RN, Marga Melnick, RN, Deland Pretty, MS, ACSM CEP, Exercise Physiologist   Supervising physician immediately available to respond to emergencies [facility] immediately available   Physician(s) Dr. Ree Kida   Medication changes reported     No   Fall or balance concerns reported    No   Warm-up and Cool-down Performed as group-led instruction   Resistance Training Performed No   VAD Patient? No     Pain Assessment   Currently in Pain? No/denies   Multiple Pain Sites No      Capillary Blood Glucose: Results for orders placed or performed during the hospital encounter of 11/10/16 (from the past 24 hour(s))  Glucose, capillary     Status: Abnormal   Collection Time: 11/10/16  3:14 PM  Result Value Ref Range   Glucose-Capillary 161 (H) 65 - 99 mg/dL  Glucose, capillary     Status: Abnormal   Collection Time: 11/10/16  3:56 PM  Result Value Ref Range   Glucose-Capillary 170 (H) 65 - 99 mg/dL     Goals Met:  No report of cardiac concerns or symptoms  Goals Unmet:  Not Applicable  Comments: Pt started cardiac rehab today.  Pt tolerated light exercise without difficulty. VSS, telemetry-Sinus rhythm with arrhythmia ,bundle branch block , asymptomatic.  Medication list reconciled. Pt denies barriers to medicaiton compliance.  PSYCHOSOCIAL ASSESSMENT:  PHQ-0. Pt exhibits positive coping skills, hopeful outlook with supportive family. No  psychosocial needs identified at this time, no psychosocial interventions necessary.    Pt enjoys gardening and archery.   Pt oriented to exercise equipment and routine.    Understanding verbalized.Barnet Pall, RN,BSN 11/11/2016 11:32 AM   Dr. Fransico Him is Medical Director for Cardiac Rehab at [facility].

## 2016-11-10 NOTE — Telephone Encounter (Signed)
Requested a call back from the patient to discuss further.  

## 2016-11-11 ENCOUNTER — Telehealth: Payer: Self-pay | Admitting: Cardiovascular Disease

## 2016-11-11 NOTE — Telephone Encounter (Signed)
Pt would like to know if he can get his lab work in [place] where he lives?

## 2016-11-11 NOTE — Telephone Encounter (Signed)
LM for patient that he can have labs done in [place] at his PCP (if he has one) and can fax results to [phone] but there is not a free-standing lab in [place]

## 2016-11-12 ENCOUNTER — Encounter (HOSPITAL_COMMUNITY): Payer: BLUE CROSS/BLUE SHIELD

## 2016-11-12 ENCOUNTER — Encounter (HOSPITAL_COMMUNITY)
Admission: RE | Admit: 2016-11-12 | Discharge: 2016-11-12 | Disposition: A | Discharge status: Home / Self Care | Payer: BLUE CROSS/BLUE SHIELD | Source: Ambulatory Visit | Attending: Cardiovascular Disease | Admitting: Cardiovascular Disease

## 2016-11-12 DIAGNOSIS — Z7982 Long term (current) use of aspirin: Secondary | ICD-10-CM | POA: Diagnosis not present

## 2016-11-12 DIAGNOSIS — Z951 Presence of aortocoronary bypass graft: Secondary | ICD-10-CM

## 2016-11-12 DIAGNOSIS — Z87891 Personal history of nicotine dependence: Secondary | ICD-10-CM | POA: Diagnosis not present

## 2016-11-12 DIAGNOSIS — Z79899 Other long term (current) drug therapy: Secondary | ICD-10-CM | POA: Diagnosis not present

## 2016-11-12 DIAGNOSIS — Z794 Long term (current) use of insulin: Secondary | ICD-10-CM | POA: Diagnosis not present

## 2016-11-12 DIAGNOSIS — Z7901 Long term (current) use of anticoagulants: Secondary | ICD-10-CM | POA: Diagnosis not present

## 2016-11-12 LAB — GLUCOSE, CAPILLARY
GLUCOSE-CAPILLARY: 138 mg/dL — AB (ref 65–99)
Glucose-Capillary: 160 mg/dL — ABNORMAL HIGH (ref 65–99)

## 2016-11-16 ENCOUNTER — Encounter: Payer: Self-pay | Admitting: Cardiovascular Disease

## 2016-11-16 DIAGNOSIS — Z79899 Other long term (current) drug therapy: Secondary | ICD-10-CM | POA: Diagnosis not present

## 2016-11-16 DIAGNOSIS — E7800 Pure hypercholesterolemia, unspecified: Secondary | ICD-10-CM | POA: Diagnosis not present

## 2016-11-17 ENCOUNTER — Encounter (HOSPITAL_COMMUNITY): Payer: BLUE CROSS/BLUE SHIELD

## 2016-11-17 ENCOUNTER — Encounter (HOSPITAL_COMMUNITY)
Admission: RE | Admit: 2016-11-17 | Discharge: 2016-11-17 | Disposition: A | Discharge status: Home / Self Care | Payer: BLUE CROSS/BLUE SHIELD | Source: Ambulatory Visit | Attending: Cardiovascular Disease | Admitting: Cardiovascular Disease

## 2016-11-17 DIAGNOSIS — Z79899 Other long term (current) drug therapy: Secondary | ICD-10-CM | POA: Diagnosis not present

## 2016-11-17 DIAGNOSIS — Z87891 Personal history of nicotine dependence: Secondary | ICD-10-CM | POA: Diagnosis not present

## 2016-11-17 DIAGNOSIS — Z7982 Long term (current) use of aspirin: Secondary | ICD-10-CM | POA: Diagnosis not present

## 2016-11-17 DIAGNOSIS — Z951 Presence of aortocoronary bypass graft: Secondary | ICD-10-CM

## 2016-11-17 DIAGNOSIS — Z794 Long term (current) use of insulin: Secondary | ICD-10-CM | POA: Diagnosis not present

## 2016-11-17 DIAGNOSIS — Z7901 Long term (current) use of anticoagulants: Secondary | ICD-10-CM | POA: Diagnosis not present

## 2016-11-17 LAB — GLUCOSE, CAPILLARY
GLUCOSE-CAPILLARY: 126 mg/dL — AB (ref 65–99)
Glucose-Capillary: 187 mg/dL — ABNORMAL HIGH (ref 65–99)

## 2016-11-19 ENCOUNTER — Encounter (HOSPITAL_COMMUNITY): Payer: BLUE CROSS/BLUE SHIELD

## 2016-11-19 ENCOUNTER — Telehealth (HOSPITAL_COMMUNITY): Payer: Self-pay | Admitting: General Practice

## 2016-11-22 DIAGNOSIS — R748 Abnormal levels of other serum enzymes: Secondary | ICD-10-CM

## 2016-11-22 HISTORY — DX: Abnormal levels of other serum enzymes: R74.8

## 2016-11-23 ENCOUNTER — Other Ambulatory Visit: Payer: Self-pay

## 2016-11-23 ENCOUNTER — Encounter: Payer: BLUE CROSS/BLUE SHIELD | Admitting: Nutrition

## 2016-11-23 MED ORDER — LISINOPRIL 5 MG PO TABS: 5.0000 mg | ORAL_TABLET | Freq: Every day | ORAL | 3 refills | Status: DC

## 2016-11-24 ENCOUNTER — Telehealth (HOSPITAL_COMMUNITY): Payer: Self-pay | Admitting: General Practice

## 2016-11-24 ENCOUNTER — Encounter: Payer: Self-pay | Admitting: Internal Medicine

## 2016-11-24 ENCOUNTER — Encounter (HOSPITAL_COMMUNITY): Payer: BLUE CROSS/BLUE SHIELD

## 2016-11-25 ENCOUNTER — Ambulatory Visit: Payer: BLUE CROSS/BLUE SHIELD | Admitting: Endocrinology

## 2016-11-25 ENCOUNTER — Encounter: Payer: Self-pay | Admitting: *Deleted

## 2016-11-25 NOTE — Progress Notes (Signed)
Cardiac Individual Treatment Plan  Patient Details  Name: DEMARIUS [name] MRN: [id number] Date of Birth: 08/31/1946 Referring Provider:   Flowsheet Row CARDIAC REHAB PHASE II ORIENTATION from 11/02/2016 in [facility]  Referring Provider  Shelva Majestic MD      Initial Encounter Date:  [place] PHASE II ORIENTATION from 11/02/2016 in [facility]  Date  11/02/16  Referring Provider  Shelva Majestic MD      Visit Diagnosis: S/P CABG x 4  Patient's Home Medications on Admission:  Current Outpatient Prescriptions:  .  alfuzosin (UROXATRAL) 10 MG 24 hr tablet, Take 10 mg by mouth daily with breakfast., Disp: , Rfl:  .  aspirin EC 81 MG EC tablet, Take 1 tablet (81 mg total) by mouth daily., Disp: , Rfl:  .  furosemide (LASIX) 40 MG tablet, Take 1 tablet (40 mg total) by mouth daily. Daily for one week - then changing to just as needed - prn weight gain/swelling, Disp: 30 tablet, Rfl: 1 .  guaiFENesin (MUCINEX) 600 MG 12 hr tablet, Take 600 mg by mouth 2 (two) times daily as needed. , Disp: , Rfl:  .  insulin aspart (NOVOLOG) 100 UNIT/ML injection, Inject 5-15 units in the skin before meals, Disp: 10 mL, Rfl: 11 .  insulin degludec (TRESIBA FLEXTOUCH) 100 UNIT/ML SOPN FlexTouch Pen, Inject 35 units in the skin daily (Patient taking differently: Inject 37 Units into the skin daily. Inject 35 units in the skin daily), Disp: 5 pen, Rfl: 2 .  Insulin Syringe-Needle U-100 (B-D INS SYRINGE 0.5CC/30GX1/2") 30G X 1/2" 0.5 ML MISC, Use to inject insulin 3 times daily, Disp: 100 each, Rfl: 2 .  lisinopril (PRINIVIL,ZESTRIL) 5 MG tablet, Take 1 tablet (5 mg total) by mouth daily., Disp: 30 tablet, Rfl: 3 .  metFORMIN (GLUCOPHAGE) 500 MG tablet, Take 3 tablets daily, Disp: 90 tablet, Rfl: 3 .  oxyCODONE (OXY IR/ROXICODONE) 5 MG immediate release tablet, Take 1-2 tablets (5-10 mg total) by mouth every 6 (six) hours as needed  for severe pain., Disp: 28 tablet, Rfl: 0 .  pantoprazole (PROTONIX) 40 MG tablet, Take 1 tablet (40 mg total) by mouth daily., Disp: 30 tablet, Rfl: 11 .  rivaroxaban (XARELTO) 20 MG TABS tablet, Take 1 tablet (20 mg total) by mouth daily with supper., Disp: 30 tablet, Rfl: 11 .  rosuvastatin (CRESTOR) 5 MG tablet, Take 1 tablet (5 mg total) by mouth every Monday, Wednesday, and Friday., Disp: 30 tablet, Rfl: 1 .  sotalol (BETAPACE) 120 MG tablet, Take 1 tablet (120 mg total) by mouth 2 (two) times daily., Disp: 60 tablet, Rfl: 10 .  VENTOLIN HFA 108 (90 Base) MCG/ACT inhaler, Inhale 2 puffs into the lungs 2 (two) times daily as needed. , Disp: , Rfl: 0  Past Medical History: Past Medical History:  Diagnosis Date  . Arthritis    "knees, hands, lower back" (07/29/2016)  . Asthma    "touch q once & awhile" (02/05/2016)  . Chronic bronchitis ([place])   . Chronic venous insufficiency    with prior venous stasis ulcers  . Complication of anesthesia    "when coming out, I choke and get very restless if breathing tube is still in"  . Coronary artery disease    last cath 2008,  remote PCI of the LAD with Lcx/OM bifurcation stenting and proximal RCA stenting  . Diabetes mellitus, type II ([place])    AODM  . Dyslipidemia   .  Exogenous obesity    severe  . History of blood transfusion ~ 2015   related to "when they went in to get my kidney stones"  . Hx of colonic polyps 09/2006   inflammatory polyp at hepatic flexure. not adenomatous or malignant.   . Hypertension   . LBBB (left bundle branch block)    He has developed a native LBBB which was seen on his last visit of March 2014 (From OV note 07/03/13)   . Long term (current) use of anticoagulants   . MI (myocardial infarction) 1995   "mild"  . Nephrolithiasis   . OSA on CPAP    "nasal CPAP" (07/29/2016)  . Osteoarthritis, knee   . PAF (paroxysmal atrial fibrillation) ([facility])   . 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 Zephyr  XL DR 5826, dual chamber, rate responsive. No arrhythmias recorded and he has an excellent threshold.  . SSS (sick sinus syndrome) ([place])   . Statin intolerance    Hx of. Now tolerating Zetia & Livalo well.   [name] Unstable angina ([place]) 08/2016    Tobacco Use: History  Smoking Status  . Former Smoker  . Packs/day: 1.00  . Years: 5.00  . Types: Cigarettes  . Quit date: 11/22/1974  Smokeless Tobacco  . Never Used    Labs: Recent Review Flowsheet Data    Labs for ITP Cardiac and Pulmonary Rehab Latest Ref Rng & Units 09/15/2016 09/15/2016 09/15/2016 09/15/2016 09/16/2016   Cholestrol 125 - 200 mg/dL - - - - -   LDLCALC <130 mg/dL - - - - -   HDL >=40 mg/dL - - - - -   Trlycerides <150 mg/dL - - - - -   Hemoglobin A1c 4.8 - 5.6 % - - - - -   PHART 7.350 - 7.450 7.310(L) 7.385 7.389 - -   PCO2ART 32.0 - 48.0 mmHg 41.1 40.1 37.5 - -   HCO3 20.0 - 28.0 mmol/L 21.0 24.1 22.8 - -   TCO2 0 - 100 mmol/L 22 25 24 23 22    ACIDBASEDEF 0.0 - 2.0 mmol/L 5.0(H) 1.0 2.0 - -   O2SAT % 93.0 98.0 95.0 - -      Capillary Blood Glucose: Lab Results  Component Value Date   GLUCAP 126 (H) 11/17/2016   GLUCAP 187 (H) 11/17/2016   GLUCAP 138 (H) 11/12/2016   GLUCAP 160 (H) 11/12/2016   GLUCAP 170 (H) 11/10/2016     Exercise Target Goals:    Exercise Program Goal: Individual exercise prescription set with THRR, safety & activity barriers. Participant demonstrates ability to understand and report RPE using BORG scale, to self-measure pulse accurately, and to acknowledge the importance of the exercise prescription.  Exercise Prescription Goal: Starting with aerobic activity 30 plus minutes a day, 3 days per week for initial exercise prescription. Provide home exercise prescription and guidelines that participant acknowledges understanding prior to discharge.  Activity Barriers & Risk Stratification:     Activity Barriers & Cardiac Risk Stratification - 11/02/16 1423      Activity Barriers &  Cardiac Risk Stratification   Activity Barriers Arthritis;Back Problems;Joint Problems;Other (comment)   Comments "bad" rotator cuff right shoulder, OA lower back, bilateral arthroscopic knee surgery   Cardiac Risk Stratification High      6 Minute Walk:     6 Minute Walk    Row Name 11/02/16 1648         6 Minute Walk   Phase Initial  Distance 845 feet     Walk Time 6 minutes     # of Rest Breaks 0     MPH 1.6     METS 1.16     RPE 11     VO2 Peak 4.05     Symptoms Yes (comment)     Comments hip and glute fatigue     Resting HR 85 bpm     Resting BP 98/64     Max Ex. HR 89 bpm     Max Ex. BP 120/60     2 Minute Post BP 106/70        Initial Exercise Prescription:     Initial Exercise Prescription - 11/02/16 1600      Date of Initial Exercise RX and Referring Provider   Date 11/02/16   Referring Provider Shelva Majestic MD     Treadmill   MPH 1.7   Grade 0   Minutes 10   METs 2.3     Bike   Level 0.3   Minutes 10   METs 1.45     NuStep   Level 2   Minutes 10   METs 2     Track   Laps --   Minutes --   METs --     Prescription Details   Frequency (times per week) 3   Duration Progress to 30 minutes of continuous aerobic without signs/symptoms of physical distress     Intensity   THRR 40-80% of Max Heartrate 60-121   Ratings of Perceived Exertion 11-13     Progression   Progression Continue progressive overload as per policy without signs/symptoms or physical distress.     Resistance Training   Training Prescription Yes   Weight 2lbs   Reps 10-12      Perform Capillary Blood Glucose checks as needed.  Exercise Prescription Changes:      Exercise Prescription Changes    Row Name 11/24/16 1400             Response to Exercise   Blood Pressure (Admit) 136/80       Blood Pressure (Exercise) 120/70       Blood Pressure (Exit) 134/80       Heart Rate (Admit) 75 bpm       Heart Rate (Exercise) 94 bpm       Heart Rate (Exit)  74 bpm       Rating of Perceived Exertion (Exercise) 13       Duration Progress to 45 minutes of aerobic exercise without signs/symptoms of physical distress       Intensity THRR unchanged         Progression   Progression Continue to progress workloads to maintain intensity without signs/symptoms of physical distress.       Average METs 1.8         Resistance Training   Training Prescription Yes       Weight 2lbs       Reps 10-12         Treadmill   MPH 1.7       Grade 0       Minutes 10       METs 2.3         Bike   Level 0.3       Minutes 10       METs 1.45         NuStep   Level 2       Minutes  10       METs 1.7          Exercise Comments:      Exercise Comments    Row Name 11/24/16 1424           Exercise Comments Pt is off to a great start and is doing well with exercise           Discharge Exercise Prescription (Final Exercise Prescription Changes):     Exercise Prescription Changes - 11/24/16 1400      Response to Exercise   Blood Pressure (Admit) 136/80   Blood Pressure (Exercise) 120/70   Blood Pressure (Exit) 134/80   Heart Rate (Admit) 75 bpm   Heart Rate (Exercise) 94 bpm   Heart Rate (Exit) 74 bpm   Rating of Perceived Exertion (Exercise) 13   Duration Progress to 45 minutes of aerobic exercise without signs/symptoms of physical distress   Intensity THRR unchanged     Progression   Progression Continue to progress workloads to maintain intensity without signs/symptoms of physical distress.   Average METs 1.8     Resistance Training   Training Prescription Yes   Weight 2lbs   Reps 10-12     Treadmill   MPH 1.7   Grade 0   Minutes 10   METs 2.3     Bike   Level 0.3   Minutes 10   METs 1.45     NuStep   Level 2   Minutes 10   METs 1.7      Nutrition:  Target Goals: Understanding of nutrition guidelines, daily intake of sodium 1500mg , cholesterol 200mg , calories 30% from fat and 7% or less from saturated fats, daily  to have 5 or more servings of fruits and vegetables.  Biometrics:     Pre Biometrics - 11/02/16 1652      Pre Biometrics   Waist Circumference 48.75 inches   Hip Circumference 51.25 inches   Waist to Hip Ratio 0.95 %   Triceps Skinfold 26 mm   % Body Fat 39.1 %   Grip Strength 42.5 kg   Flexibility 9 in   Single Leg Stand 1.56 seconds       Nutrition Therapy Plan and Nutrition Goals:   Nutrition Discharge: Nutrition Scores:   Nutrition Goals Re-Evaluation:   Psychosocial: Target Goals: Acknowledge presence or absence of depression, maximize coping skills, provide positive support system. Participant is able to verbalize types and ability to use techniques and skills needed for reducing stress and depression.  Initial Review & Psychosocial Screening:     Initial Psych Review & Screening - 11/02/16 [place]? Yes  wife      Barriers   Psychosocial barriers to participate in program There are no identifiable barriers or psychosocial needs.     Screening Interventions   Interventions Encouraged to exercise      Quality of Life Scores:     Quality of Life - 11/02/16 1641      Quality of Life Scores   Health/Function Pre 9.1 %   Socioeconomic Pre 21.06 %   Psych/Spiritual Pre 17.79 %   Family Pre 13.2 %   GLOBAL Pre 14.16 %      PHQ-9: Recent Review Flowsheet Data    Depression screen [facility] 2/9 11/10/2016   Decreased Interest 0   Down, Depressed, Hopeless 0   PHQ - 2 Score 0      Psychosocial Evaluation  and Intervention:   Psychosocial Re-Evaluation:     Psychosocial Re-Evaluation    [place] Name 11/25/16 1624             Psychosocial Re-Evaluation   Interventions Encouraged to attend Cardiac Rehabilitation for the exercise       Continued Psychosocial Services Needed No          Vocational Rehabilitation: Provide vocational rehab assistance to qualifying candidates.   Vocational Rehab Evaluation &  Intervention:     Vocational Rehab - 11/02/16 1711      Initial Vocational Rehab Evaluation & Intervention   Assessment shows need for Vocational Rehabilitation No  pt did not complete form. will give to pt at next scheduled appt to complete       Education: Education Goals: Education classes will be provided on a weekly basis, covering required topics. Participant will state understanding/return demonstration of topics presented.  Learning Barriers/Preferences:     Learning Barriers/Preferences - 11/02/16 1648      Learning Barriers/Preferences   Learning Barriers Sight;Hearing   Learning Preferences None      Education Topics: Count Your Pulse:  -Group instruction provided by verbal instruction, demonstration, patient participation and written materials to support subject.  Instructors address importance of being able to find your pulse and how to count your pulse when at home without a heart monitor.  Patients get hands on experience counting their pulse with staff help and individually.   Heart Attack, Angina, and Risk Factor Modification:  -Group instruction provided by verbal instruction, video, and written materials to support subject.  Instructors address signs and symptoms of angina and heart attacks.    Also discuss risk factors for heart disease and how to make changes to improve heart health risk factors.   Functional Fitness:  -Group instruction provided by verbal instruction, demonstration, patient participation, and written materials to support subject.  Instructors address safety measures for doing things around the house.  Discuss how to get up and down off the floor, how to pick things up properly, how to safely get out of a chair without assistance, and balance training. Flowsheet Row CARDIAC REHAB PHASE II EXERCISE from 11/17/2016 in [place]  Date  11/12/16  Educator  Cleda Mccreedy, [place]  Instruction Review Code  2- meets  goals/outcomes      Meditation and Mindfulness:  -Group instruction provided by verbal instruction, patient participation, and written materials to support subject.  Instructor addresses importance of mindfulness and meditation practice to help reduce stress and improve awareness.  Instructor also leads participants through a meditation exercise.  Flowsheet Row CARDIAC REHAB PHASE II EXERCISE from 11/17/2016 in [facility]  Date  11/17/16  Instruction Review Code  2- meets goals/outcomes      Stretching for Flexibility and Mobility:  -Group instruction provided by verbal instruction, patient participation, and written materials to support subject.  Instructors lead participants through series of stretches that are designed to increase flexibility thus improving mobility.  These stretches are additional exercise for major muscle groups that are typically performed during regular warm up and cool down.   Hands Only CPR Anytime:  -Group instruction provided by verbal instruction, video, patient participation and written materials to support subject.  Instructors co-teach with AHA video for hands only CPR.  Participants get hands on experience with mannequins.   Nutrition I class: Heart Healthy Eating:  -Group instruction provided by PowerPoint slides, verbal discussion, and written materials to  support subject matter. The instructor gives an explanation and review of the Therapeutic Lifestyle Changes diet recommendations, which includes a discussion on lipid goals, dietary fat, sodium, fiber, plant stanol/sterol esters, sugar, and the components of a well-balanced, healthy diet.   Nutrition II class: Lifestyle Skills:  -Group instruction provided by PowerPoint slides, verbal discussion, and written materials to support subject matter. The instructor gives an explanation and review of label reading, grocery shopping for heart health, heart healthy recipe  modifications, and ways to make healthier choices when eating out.   Diabetes Question & Answer:  -Group instruction provided by PowerPoint slides, verbal discussion, and written materials to support subject matter. The instructor gives an explanation and review of diabetes co-morbidities, pre- and post-prandial blood glucose goals, pre-exercise blood glucose goals, signs, symptoms, and treatment of hypoglycemia and hyperglycemia, and foot care basics.   Diabetes Blitz:  -Group instruction provided by PowerPoint slides, verbal discussion, and written materials to support subject matter. The instructor gives an explanation and review of the physiology behind type 1 and type 2 diabetes, diabetes medications and rational behind using different medications, pre- and post-prandial blood glucose recommendations and Hemoglobin A1c goals, diabetes diet, and exercise including blood glucose guidelines for exercising safely.    Portion Distortion:  -Group instruction provided by PowerPoint slides, verbal discussion, written materials, and food models to support subject matter. The instructor gives an explanation of serving size versus portion size, changes in portions sizes over the last 20 years, and what consists of a serving from each food group.   Stress Management:  -Group instruction provided by verbal instruction, video, and written materials to support subject matter.  Instructors review role of stress in heart disease and how to cope with stress positively.     Exercising on Your Own:  -Group instruction provided by verbal instruction, power point, and written materials to support subject.  Instructors discuss benefits of exercise, components of exercise, frequency and intensity of exercise, and end points for exercise.  Also discuss use of nitroglycerin and activating EMS.  Review options of places to exercise outside of rehab.  Review guidelines for sex with heart disease.   Cardiac Drugs I:   -Group instruction provided by verbal instruction and written materials to support subject.  Instructor reviews cardiac drug classes: antiplatelets, anticoagulants, beta blockers, and statins.  Instructor discusses reasons, side effects, and lifestyle considerations for each drug class.   Cardiac Drugs II:  -Group instruction provided by verbal instruction and written materials to support subject.  Instructor reviews cardiac drug classes: angiotensin converting enzyme inhibitors (ACE-I), angiotensin II receptor blockers (ARBs), nitrates, and calcium channel blockers.  Instructor discusses reasons, side effects, and lifestyle considerations for each drug class.   Anatomy and Physiology of the Circulatory System:  -Group instruction provided by verbal instruction, video, and written materials to support subject.  Reviews functional anatomy of heart, how it relates to various diagnoses, and what role the heart plays in the overall system. Flowsheet Row CARDIAC REHAB PHASE II EXERCISE from 11/17/2016 in [facility]  Date  11/10/16  Instruction Review Code  2- meets goals/outcomes      Knowledge Questionnaire Score:     Knowledge Questionnaire Score - 11/02/16 1641      Knowledge Questionnaire Score   Pre Score 20/24      Core Components/Risk Factors/Patient Goals at Admission:     Personal Goals and Risk Factors at Admission - 11/02/16 1712      Core  Components/Risk Factors/Patient Goals on Admission    Weight Management Obesity;Yes   Intervention Weight Management: Develop a combined nutrition and exercise program designed to reach desired caloric intake, while maintaining appropriate intake of nutrient and fiber, sodium and fats, and appropriate energy expenditure required for the weight goal.;Weight Management: Provide education and appropriate resources to help participant work on and attain dietary goals.;Obesity: Provide education and appropriate  resources to help participant work on and attain dietary goals.   Admit Weight 280 lb 13.9 oz (127.4 kg)   Expected Outcomes Short Term: Continue to assess and modify interventions until short term weight is achieved;Long Term: Adherence to nutrition and physical activity/exercise program aimed toward attainment of established weight goal   Diabetes Yes   Intervention Provide education about signs/symptoms and action to take for hypo/hyperglycemia.;Provide education about proper nutrition, including hydration, and aerobic/resistive exercise prescription along with prescribed medications to achieve blood glucose in normal ranges: Fasting glucose 65-99 mg/dL   Expected Outcomes Short Term: Participant verbalizes understanding of the signs/symptoms and immediate care of hyper/hypoglycemia, proper foot care and importance of medication, aerobic/resistive exercise and nutrition plan for blood glucose control.;Long Term: Attainment of HbA1C < 7%.   Lipids Yes   Intervention Provide education and support for participant on nutrition & aerobic/resistive exercise along with prescribed medications to achieve LDL 70mg , HDL >40mg .   Expected Outcomes Short Term: Participant states understanding of desired cholesterol values and is compliant with medications prescribed. Participant is following exercise prescription and nutrition guidelines.;Long Term: Cholesterol controlled with medications as prescribed, with individualized exercise RX and with personalized nutrition plan. Value goals: LDL < 70mg , HDL > 40 mg.   Personal Goal Other Yes   Personal Goal Short Term:  Increase metobolism and lose weight.  Long Term:  Live longer, Goal Weight:  225lb,   Intervention provide exercise, nutrition and risk factor reduction education classes to promote life style modification   Expected Outcomes pt will participate with individualized exercise RX and personalized nutrition plan to continue striving towards weight loss goals        Core Components/Risk Factors/Patient Goals Review:    Core Components/Risk Factors/Patient Goals at Discharge (Final Review):    ITP Comments:     ITP Comments    Row Name 11/02/16 1346           ITP Comments Medical Director- Dr. Fransico Him, MD.          Comments: Suezanne Jacquet  is making expected progress toward personal goals after completing 4 sessions. Recommend continued exercise and life style modification education including  stress management and relaxation techniques to decrease cardiac risk profile. Suezanne Jacquet has been out this week.Barnet Pall, RN,BSN 11/25/2016 4:29 PM

## 2016-11-26 ENCOUNTER — Encounter (HOSPITAL_COMMUNITY): Payer: BLUE CROSS/BLUE SHIELD

## 2016-11-26 ENCOUNTER — Encounter (HOSPITAL_COMMUNITY)
Admission: RE | Admit: 2016-11-26 | Discharge: 2016-11-26 | Disposition: A | Discharge status: Home / Self Care | Payer: BLUE CROSS/BLUE SHIELD | Source: Ambulatory Visit | Attending: Cardiovascular Disease | Admitting: Cardiovascular Disease

## 2016-11-26 DIAGNOSIS — Z7901 Long term (current) use of anticoagulants: Secondary | ICD-10-CM | POA: Insufficient documentation

## 2016-11-26 DIAGNOSIS — Z7982 Long term (current) use of aspirin: Secondary | ICD-10-CM | POA: Diagnosis not present

## 2016-11-26 DIAGNOSIS — Z794 Long term (current) use of insulin: Secondary | ICD-10-CM | POA: Diagnosis not present

## 2016-11-26 DIAGNOSIS — Z79899 Other long term (current) drug therapy: Secondary | ICD-10-CM | POA: Insufficient documentation

## 2016-11-26 DIAGNOSIS — Z87891 Personal history of nicotine dependence: Secondary | ICD-10-CM | POA: Diagnosis not present

## 2016-11-26 DIAGNOSIS — Z951 Presence of aortocoronary bypass graft: Secondary | ICD-10-CM | POA: Insufficient documentation

## 2016-11-29 ENCOUNTER — Encounter (HOSPITAL_COMMUNITY): Payer: BLUE CROSS/BLUE SHIELD

## 2016-11-29 ENCOUNTER — Telehealth (HOSPITAL_COMMUNITY): Payer: Self-pay | Admitting: General Practice

## 2016-11-29 LAB — GLUCOSE, CAPILLARY: Glucose-Capillary: 175 mg/dL — ABNORMAL HIGH (ref 65–99)

## 2016-12-01 ENCOUNTER — Encounter (HOSPITAL_COMMUNITY)
Admission: RE | Admit: 2016-12-01 | Discharge: 2016-12-01 | Disposition: A | Discharge status: Home / Self Care | Payer: BLUE CROSS/BLUE SHIELD | Source: Ambulatory Visit | Attending: Cardiovascular Disease | Admitting: Cardiovascular Disease

## 2016-12-01 ENCOUNTER — Encounter (HOSPITAL_COMMUNITY): Payer: BLUE CROSS/BLUE SHIELD

## 2016-12-01 DIAGNOSIS — Z79899 Other long term (current) drug therapy: Secondary | ICD-10-CM | POA: Diagnosis not present

## 2016-12-01 DIAGNOSIS — Z951 Presence of aortocoronary bypass graft: Secondary | ICD-10-CM

## 2016-12-01 DIAGNOSIS — Z87891 Personal history of nicotine dependence: Secondary | ICD-10-CM | POA: Diagnosis not present

## 2016-12-01 DIAGNOSIS — Z7901 Long term (current) use of anticoagulants: Secondary | ICD-10-CM | POA: Diagnosis not present

## 2016-12-01 DIAGNOSIS — Z794 Long term (current) use of insulin: Secondary | ICD-10-CM | POA: Diagnosis not present

## 2016-12-01 DIAGNOSIS — Z7982 Long term (current) use of aspirin: Secondary | ICD-10-CM | POA: Diagnosis not present

## 2016-12-01 NOTE — Progress Notes (Signed)
Reviewed home exercise program with pt.  Discussed mode/frequency of exercise, THRR, RPE scale and weather conditions for exercising outdoors.  Also discussed signs and symptoms and when to call Dr./911.  Pt verbalized understanding.  Cleda Mccreedy 12/01/2016 14:47

## 2016-12-02 ENCOUNTER — Telehealth: Payer: Self-pay | Admitting: Endocrinology

## 2016-12-02 NOTE — Telephone Encounter (Signed)
BS readings for last 3 days  1/11 144 fasting before breakfast 1/10 147 1/9 125 1/8 132  Please advise pt if this is ok with the current dosing he is using for insulin.

## 2016-12-03 ENCOUNTER — Encounter (HOSPITAL_COMMUNITY): Payer: BLUE CROSS/BLUE SHIELD

## 2016-12-06 ENCOUNTER — Encounter (HOSPITAL_COMMUNITY)
Admission: RE | Admit: 2016-12-06 | Discharge: 2016-12-06 | Disposition: A | Discharge status: Home / Self Care | Payer: BLUE CROSS/BLUE SHIELD | Source: Ambulatory Visit | Attending: Cardiovascular Disease | Admitting: Cardiovascular Disease

## 2016-12-06 ENCOUNTER — Encounter (HOSPITAL_COMMUNITY): Payer: BLUE CROSS/BLUE SHIELD

## 2016-12-06 DIAGNOSIS — Z79899 Other long term (current) drug therapy: Secondary | ICD-10-CM | POA: Diagnosis not present

## 2016-12-06 DIAGNOSIS — Z951 Presence of aortocoronary bypass graft: Secondary | ICD-10-CM

## 2016-12-06 DIAGNOSIS — Z87891 Personal history of nicotine dependence: Secondary | ICD-10-CM | POA: Diagnosis not present

## 2016-12-06 DIAGNOSIS — Z7901 Long term (current) use of anticoagulants: Secondary | ICD-10-CM | POA: Diagnosis not present

## 2016-12-06 DIAGNOSIS — Z794 Long term (current) use of insulin: Secondary | ICD-10-CM | POA: Diagnosis not present

## 2016-12-06 DIAGNOSIS — Z7982 Long term (current) use of aspirin: Secondary | ICD-10-CM | POA: Diagnosis not present

## 2016-12-06 LAB — GLUCOSE, CAPILLARY: Glucose-Capillary: 134 mg/dL — ABNORMAL HIGH (ref 65–99)
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
## 2016-12-07 ENCOUNTER — Encounter: Payer: Self-pay | Admitting: Student

## 2016-12-07 ENCOUNTER — Ambulatory Visit (INDEPENDENT_AMBULATORY_CARE_PROVIDER_SITE_OTHER): Payer: BLUE CROSS/BLUE SHIELD | Admitting: Student

## 2016-12-07 VITALS — BP 126/90 | HR 72 | Ht 69.0 in | Wt 276.0 lb

## 2016-12-07 DIAGNOSIS — I2510 Atherosclerotic heart disease of native coronary artery without angina pectoris: Secondary | ICD-10-CM | POA: Diagnosis not present

## 2016-12-07 DIAGNOSIS — R0789 Other chest pain: Secondary | ICD-10-CM | POA: Diagnosis not present

## 2016-12-07 DIAGNOSIS — I495 Sick sinus syndrome: Secondary | ICD-10-CM | POA: Diagnosis not present

## 2016-12-07 DIAGNOSIS — I10 Essential (primary) hypertension: Secondary | ICD-10-CM

## 2016-12-07 DIAGNOSIS — I480 Paroxysmal atrial fibrillation: Secondary | ICD-10-CM

## 2016-12-07 DIAGNOSIS — Z951 Presence of aortocoronary bypass graft: Secondary | ICD-10-CM

## 2016-12-07 LAB — GLUCOSE, CAPILLARY: GLUCOSE-CAPILLARY: 114 mg/dL — AB (ref 65–99)

## 2016-12-07 NOTE — Telephone Encounter (Signed)
Left a voice mail about what Dr. Dwyane Dee stated and asked for a call back if he had any questions

## 2016-12-07 NOTE — Patient Instructions (Signed)
A chest x-ray takes a picture of the organs and structures inside the chest, including the heart, lungs, and blood vessels. This test can show several things, including, whether the heart is enlarges; whether fluid is building up in the lungs; and whether pacemaker / defibrillator leads are still in place.  Please keep your follow-up with Dr Claiborne Billings!

## 2016-12-07 NOTE — Telephone Encounter (Signed)
Blood sugars are okay, we will review in detail on his upcoming visit.  Needs to check more readings after meals

## 2016-12-08 ENCOUNTER — Encounter (HOSPITAL_COMMUNITY): Payer: BLUE CROSS/BLUE SHIELD

## 2016-12-09 ENCOUNTER — Ambulatory Visit: Payer: BLUE CROSS/BLUE SHIELD | Admitting: Endocrinology

## 2016-12-10 ENCOUNTER — Encounter (HOSPITAL_COMMUNITY): Payer: BLUE CROSS/BLUE SHIELD

## 2016-12-13 ENCOUNTER — Encounter (HOSPITAL_COMMUNITY): Payer: BLUE CROSS/BLUE SHIELD

## 2016-12-13 ENCOUNTER — Encounter (HOSPITAL_COMMUNITY)
Admission: RE | Admit: 2016-12-13 | Discharge: 2016-12-13 | Disposition: A | Discharge status: Home / Self Care | Payer: BLUE CROSS/BLUE SHIELD | Source: Ambulatory Visit | Attending: Cardiovascular Disease | Admitting: Cardiovascular Disease

## 2016-12-13 ENCOUNTER — Ambulatory Visit
Admission: RE | Admit: 2016-12-13 | Discharge: 2016-12-13 | Disposition: A | Discharge status: Home / Self Care | Payer: BLUE CROSS/BLUE SHIELD | Source: Ambulatory Visit | Attending: Student | Admitting: Student

## 2016-12-13 DIAGNOSIS — Z951 Presence of aortocoronary bypass graft: Secondary | ICD-10-CM

## 2016-12-13 DIAGNOSIS — Z7901 Long term (current) use of anticoagulants: Secondary | ICD-10-CM | POA: Diagnosis not present

## 2016-12-13 DIAGNOSIS — Z794 Long term (current) use of insulin: Secondary | ICD-10-CM | POA: Diagnosis not present

## 2016-12-13 DIAGNOSIS — Z87891 Personal history of nicotine dependence: Secondary | ICD-10-CM | POA: Diagnosis not present

## 2016-12-13 DIAGNOSIS — R0789 Other chest pain: Secondary | ICD-10-CM | POA: Diagnosis not present

## 2016-12-13 DIAGNOSIS — Z7982 Long term (current) use of aspirin: Secondary | ICD-10-CM | POA: Diagnosis not present

## 2016-12-13 DIAGNOSIS — Z79899 Other long term (current) drug therapy: Secondary | ICD-10-CM | POA: Diagnosis not present

## 2016-12-15 ENCOUNTER — Encounter (HOSPITAL_COMMUNITY)
Admission: RE | Admit: 2016-12-15 | Discharge: 2016-12-15 | Disposition: A | Discharge status: Home / Self Care | Payer: BLUE CROSS/BLUE SHIELD | Source: Ambulatory Visit | Attending: Cardiovascular Disease | Admitting: Cardiovascular Disease

## 2016-12-15 ENCOUNTER — Encounter (HOSPITAL_COMMUNITY): Payer: BLUE CROSS/BLUE SHIELD

## 2016-12-15 DIAGNOSIS — Z7901 Long term (current) use of anticoagulants: Secondary | ICD-10-CM | POA: Diagnosis not present

## 2016-12-15 DIAGNOSIS — Z794 Long term (current) use of insulin: Secondary | ICD-10-CM | POA: Diagnosis not present

## 2016-12-15 DIAGNOSIS — Z79899 Other long term (current) drug therapy: Secondary | ICD-10-CM | POA: Diagnosis not present

## 2016-12-15 DIAGNOSIS — Z951 Presence of aortocoronary bypass graft: Secondary | ICD-10-CM | POA: Diagnosis not present

## 2016-12-15 DIAGNOSIS — Z7982 Long term (current) use of aspirin: Secondary | ICD-10-CM | POA: Diagnosis not present

## 2016-12-15 DIAGNOSIS — Z87891 Personal history of nicotine dependence: Secondary | ICD-10-CM | POA: Diagnosis not present

## 2016-12-16 ENCOUNTER — Telehealth: Payer: Self-pay | Admitting: Cardiovascular Disease

## 2016-12-16 NOTE — Telephone Encounter (Signed)
New Message  Pt voiced returning call in regards to his lab work.  Please f/u

## 2016-12-16 NOTE — Telephone Encounter (Signed)
Spoke to patient. Chest  xray Result given . Verbalized understanding.

## 2016-12-17 ENCOUNTER — Encounter (HOSPITAL_COMMUNITY): Payer: BLUE CROSS/BLUE SHIELD

## 2016-12-20 ENCOUNTER — Encounter (HOSPITAL_COMMUNITY): Payer: BLUE CROSS/BLUE SHIELD

## 2016-12-20 ENCOUNTER — Encounter (HOSPITAL_COMMUNITY)
Admission: RE | Admit: 2016-12-20 | Discharge: 2016-12-20 | Disposition: A | Discharge status: Home / Self Care | Payer: BLUE CROSS/BLUE SHIELD | Source: Ambulatory Visit | Attending: Cardiovascular Disease | Admitting: Cardiovascular Disease

## 2016-12-20 DIAGNOSIS — R140 Abdominal distension (gaseous): Secondary | ICD-10-CM | POA: Diagnosis not present

## 2016-12-20 DIAGNOSIS — J101 Influenza due to other identified influenza virus with other respiratory manifestations: Secondary | ICD-10-CM | POA: Diagnosis not present

## 2016-12-20 DIAGNOSIS — R110 Nausea: Secondary | ICD-10-CM | POA: Diagnosis not present

## 2016-12-20 DIAGNOSIS — Z794 Long term (current) use of insulin: Secondary | ICD-10-CM | POA: Diagnosis not present

## 2016-12-20 DIAGNOSIS — Z7982 Long term (current) use of aspirin: Secondary | ICD-10-CM | POA: Diagnosis not present

## 2016-12-20 DIAGNOSIS — I2510 Atherosclerotic heart disease of native coronary artery without angina pectoris: Secondary | ICD-10-CM | POA: Diagnosis not present

## 2016-12-20 DIAGNOSIS — I110 Hypertensive heart disease with heart failure: Secondary | ICD-10-CM | POA: Diagnosis not present

## 2016-12-20 DIAGNOSIS — Z87891 Personal history of nicotine dependence: Secondary | ICD-10-CM | POA: Diagnosis not present

## 2016-12-20 DIAGNOSIS — R6883 Chills (without fever): Secondary | ICD-10-CM | POA: Diagnosis not present

## 2016-12-20 DIAGNOSIS — R109 Unspecified abdominal pain: Secondary | ICD-10-CM | POA: Diagnosis not present

## 2016-12-20 DIAGNOSIS — R509 Fever, unspecified: Secondary | ICD-10-CM | POA: Diagnosis not present

## 2016-12-20 DIAGNOSIS — Z955 Presence of coronary angioplasty implant and graft: Secondary | ICD-10-CM | POA: Diagnosis not present

## 2016-12-20 DIAGNOSIS — R42 Dizziness and giddiness: Secondary | ICD-10-CM | POA: Diagnosis not present

## 2016-12-20 DIAGNOSIS — R531 Weakness: Secondary | ICD-10-CM | POA: Diagnosis not present

## 2016-12-20 DIAGNOSIS — I509 Heart failure, unspecified: Secondary | ICD-10-CM | POA: Diagnosis not present

## 2016-12-20 DIAGNOSIS — I4891 Unspecified atrial fibrillation: Secondary | ICD-10-CM | POA: Diagnosis not present

## 2016-12-20 DIAGNOSIS — I252 Old myocardial infarction: Secondary | ICD-10-CM | POA: Diagnosis not present

## 2016-12-20 DIAGNOSIS — N390 Urinary tract infection, site not specified: Secondary | ICD-10-CM | POA: Diagnosis not present

## 2016-12-20 DIAGNOSIS — E119 Type 2 diabetes mellitus without complications: Secondary | ICD-10-CM | POA: Diagnosis not present

## 2016-12-21 ENCOUNTER — Telehealth: Payer: Self-pay | Admitting: Cardiovascular Disease

## 2016-12-21 NOTE — Telephone Encounter (Signed)
New Message  Pt voiced wanting to speak to nurse about a triple by pass and about fluid in his lungs.  Please f/u

## 2016-12-21 NOTE — Telephone Encounter (Signed)
Spoke with pt states that he thought that he had the flu and he went to [facility] ER and was told that he has a UTI and was given keflex to take and he is unable to keep anything down so he is just drinking ensure.

## 2016-12-22 ENCOUNTER — Encounter (HOSPITAL_COMMUNITY): Payer: BLUE CROSS/BLUE SHIELD

## 2016-12-22 DIAGNOSIS — R531 Weakness: Secondary | ICD-10-CM | POA: Diagnosis not present

## 2016-12-22 DIAGNOSIS — R5383 Other fatigue: Secondary | ICD-10-CM | POA: Diagnosis not present

## 2016-12-22 DIAGNOSIS — Z7901 Long term (current) use of anticoagulants: Secondary | ICD-10-CM | POA: Diagnosis not present

## 2016-12-22 DIAGNOSIS — B952 Enterococcus as the cause of diseases classified elsewhere: Secondary | ICD-10-CM | POA: Diagnosis not present

## 2016-12-22 DIAGNOSIS — E119 Type 2 diabetes mellitus without complications: Secondary | ICD-10-CM | POA: Diagnosis not present

## 2016-12-22 DIAGNOSIS — E11649 Type 2 diabetes mellitus with hypoglycemia without coma: Secondary | ICD-10-CM | POA: Diagnosis not present

## 2016-12-22 DIAGNOSIS — I480 Paroxysmal atrial fibrillation: Secondary | ICD-10-CM | POA: Diagnosis not present

## 2016-12-22 DIAGNOSIS — Z87891 Personal history of nicotine dependence: Secondary | ICD-10-CM | POA: Diagnosis not present

## 2016-12-22 DIAGNOSIS — I4581 Long QT syndrome: Secondary | ICD-10-CM | POA: Diagnosis not present

## 2016-12-22 DIAGNOSIS — I110 Hypertensive heart disease with heart failure: Secondary | ICD-10-CM | POA: Diagnosis not present

## 2016-12-22 DIAGNOSIS — Z955 Presence of coronary angioplasty implant and graft: Secondary | ICD-10-CM | POA: Diagnosis not present

## 2016-12-22 DIAGNOSIS — Z6841 Body Mass Index (BMI) 40.0 and over, adult: Secondary | ICD-10-CM | POA: Diagnosis not present

## 2016-12-22 DIAGNOSIS — I2510 Atherosclerotic heart disease of native coronary artery without angina pectoris: Secondary | ICD-10-CM | POA: Diagnosis not present

## 2016-12-22 DIAGNOSIS — N390 Urinary tract infection, site not specified: Secondary | ICD-10-CM | POA: Diagnosis not present

## 2016-12-22 DIAGNOSIS — I447 Left bundle-branch block, unspecified: Secondary | ICD-10-CM | POA: Diagnosis not present

## 2016-12-22 DIAGNOSIS — N3001 Acute cystitis with hematuria: Secondary | ICD-10-CM | POA: Diagnosis not present

## 2016-12-22 DIAGNOSIS — Z7982 Long term (current) use of aspirin: Secondary | ICD-10-CM | POA: Diagnosis not present

## 2016-12-22 DIAGNOSIS — Z794 Long term (current) use of insulin: Secondary | ICD-10-CM | POA: Diagnosis not present

## 2016-12-22 DIAGNOSIS — I252 Old myocardial infarction: Secondary | ICD-10-CM | POA: Diagnosis not present

## 2016-12-22 DIAGNOSIS — R7881 Bacteremia: Secondary | ICD-10-CM | POA: Diagnosis not present

## 2016-12-22 DIAGNOSIS — I5022 Chronic systolic (congestive) heart failure: Secondary | ICD-10-CM | POA: Diagnosis not present

## 2016-12-23 ENCOUNTER — Encounter: Payer: BLUE CROSS/BLUE SHIELD | Admitting: Internal Medicine

## 2016-12-23 DIAGNOSIS — N3001 Acute cystitis with hematuria: Secondary | ICD-10-CM

## 2016-12-23 DIAGNOSIS — E11649 Type 2 diabetes mellitus with hypoglycemia without coma: Secondary | ICD-10-CM | POA: Diagnosis not present

## 2016-12-23 DIAGNOSIS — R7881 Bacteremia: Secondary | ICD-10-CM | POA: Diagnosis not present

## 2016-12-23 DIAGNOSIS — B952 Enterococcus as the cause of diseases classified elsewhere: Secondary | ICD-10-CM | POA: Diagnosis not present

## 2016-12-23 DIAGNOSIS — N390 Urinary tract infection, site not specified: Secondary | ICD-10-CM | POA: Diagnosis not present

## 2016-12-23 HISTORY — DX: Acute cystitis with hematuria: N30.01

## 2016-12-23 HISTORY — DX: Bacteremia: R78.81

## 2016-12-24 ENCOUNTER — Encounter (HOSPITAL_COMMUNITY): Payer: BLUE CROSS/BLUE SHIELD

## 2016-12-24 DIAGNOSIS — R7881 Bacteremia: Secondary | ICD-10-CM | POA: Diagnosis not present

## 2016-12-24 DIAGNOSIS — N3001 Acute cystitis with hematuria: Secondary | ICD-10-CM | POA: Diagnosis not present

## 2016-12-24 DIAGNOSIS — I4581 Long QT syndrome: Secondary | ICD-10-CM | POA: Diagnosis not present

## 2016-12-24 DIAGNOSIS — B952 Enterococcus as the cause of diseases classified elsewhere: Secondary | ICD-10-CM | POA: Diagnosis not present

## 2016-12-24 DIAGNOSIS — N390 Urinary tract infection, site not specified: Secondary | ICD-10-CM | POA: Diagnosis not present

## 2016-12-25 DIAGNOSIS — I4581 Long QT syndrome: Secondary | ICD-10-CM | POA: Diagnosis not present

## 2016-12-25 DIAGNOSIS — B952 Enterococcus as the cause of diseases classified elsewhere: Secondary | ICD-10-CM | POA: Diagnosis not present

## 2016-12-25 DIAGNOSIS — N390 Urinary tract infection, site not specified: Secondary | ICD-10-CM | POA: Diagnosis not present

## 2016-12-25 DIAGNOSIS — R7881 Bacteremia: Secondary | ICD-10-CM | POA: Diagnosis not present

## 2016-12-26 DIAGNOSIS — B952 Enterococcus as the cause of diseases classified elsewhere: Secondary | ICD-10-CM | POA: Diagnosis not present

## 2016-12-26 DIAGNOSIS — N390 Urinary tract infection, site not specified: Secondary | ICD-10-CM | POA: Diagnosis not present

## 2016-12-26 DIAGNOSIS — I4581 Long QT syndrome: Secondary | ICD-10-CM | POA: Diagnosis not present

## 2016-12-26 DIAGNOSIS — I447 Left bundle-branch block, unspecified: Secondary | ICD-10-CM | POA: Diagnosis not present

## 2016-12-26 DIAGNOSIS — R7881 Bacteremia: Secondary | ICD-10-CM | POA: Diagnosis not present

## 2016-12-27 ENCOUNTER — Encounter (HOSPITAL_COMMUNITY): Payer: BLUE CROSS/BLUE SHIELD

## 2016-12-27 ENCOUNTER — Encounter (HOSPITAL_COMMUNITY): Admission: RE | Admit: 2016-12-27 | Payer: BLUE CROSS/BLUE SHIELD | Source: Ambulatory Visit

## 2016-12-27 ENCOUNTER — Telehealth (HOSPITAL_COMMUNITY): Payer: Self-pay | Admitting: *Deleted

## 2016-12-27 DIAGNOSIS — B952 Enterococcus as the cause of diseases classified elsewhere: Secondary | ICD-10-CM | POA: Diagnosis not present

## 2016-12-27 DIAGNOSIS — R7881 Bacteremia: Secondary | ICD-10-CM | POA: Diagnosis not present

## 2016-12-27 DIAGNOSIS — N390 Urinary tract infection, site not specified: Secondary | ICD-10-CM | POA: Diagnosis not present

## 2016-12-27 DIAGNOSIS — I4581 Long QT syndrome: Secondary | ICD-10-CM | POA: Diagnosis not present

## 2016-12-27 DIAGNOSIS — N3001 Acute cystitis with hematuria: Secondary | ICD-10-CM | POA: Diagnosis not present

## 2016-12-27 NOTE — Telephone Encounter (Signed)
Pt called to let rehab staff know that he was in the hospital at [facility].  Pt has UTI.  Pt hopes to go home on tomorrow.  Pt reports having an interaction with sotolol and the antibiotic therapy. Pt will call to verify that he is home. Cherre Huger, BSN

## 2016-12-28 DIAGNOSIS — R7881 Bacteremia: Secondary | ICD-10-CM | POA: Diagnosis not present

## 2016-12-28 DIAGNOSIS — N390 Urinary tract infection, site not specified: Secondary | ICD-10-CM | POA: Diagnosis not present

## 2016-12-28 DIAGNOSIS — I4581 Long QT syndrome: Secondary | ICD-10-CM | POA: Diagnosis not present

## 2016-12-28 DIAGNOSIS — B952 Enterococcus as the cause of diseases classified elsewhere: Secondary | ICD-10-CM | POA: Diagnosis not present

## 2016-12-28 DIAGNOSIS — N3001 Acute cystitis with hematuria: Secondary | ICD-10-CM | POA: Diagnosis not present

## 2016-12-29 ENCOUNTER — Telehealth: Payer: Self-pay | Admitting: Endocrinology

## 2016-12-29 ENCOUNTER — Encounter (HOSPITAL_COMMUNITY): Admission: RE | Admit: 2016-12-29 | Payer: BLUE CROSS/BLUE SHIELD | Source: Ambulatory Visit

## 2016-12-29 ENCOUNTER — Encounter (HOSPITAL_COMMUNITY): Payer: BLUE CROSS/BLUE SHIELD

## 2016-12-29 DIAGNOSIS — E1169 Type 2 diabetes mellitus with other specified complication: Secondary | ICD-10-CM

## 2016-12-29 DIAGNOSIS — E669 Obesity, unspecified: Principal | ICD-10-CM

## 2016-12-29 MED ORDER — GLUCOSE BLOOD VI STRP: ORAL_STRIP | 12 refills | Status: DC

## 2016-12-29 NOTE — Telephone Encounter (Signed)
Test strips for the one touch ultra two call in to [facility] family pharmacy

## 2016-12-29 NOTE — Telephone Encounter (Signed)
SENT ELECTRONICALLY.

## 2016-12-31 ENCOUNTER — Other Ambulatory Visit: Payer: Self-pay

## 2016-12-31 ENCOUNTER — Encounter (HOSPITAL_COMMUNITY): Payer: BLUE CROSS/BLUE SHIELD | Attending: Cardiovascular Disease

## 2016-12-31 ENCOUNTER — Encounter (HOSPITAL_COMMUNITY): Payer: BLUE CROSS/BLUE SHIELD

## 2016-12-31 ENCOUNTER — Encounter: Payer: Self-pay | Admitting: Internal Medicine

## 2016-12-31 ENCOUNTER — Ambulatory Visit (INDEPENDENT_AMBULATORY_CARE_PROVIDER_SITE_OTHER): Payer: BLUE CROSS/BLUE SHIELD | Admitting: Internal Medicine

## 2016-12-31 VITALS — BP 134/82 | HR 70 | Ht 69.0 in | Wt 275.8 lb

## 2016-12-31 DIAGNOSIS — I10 Essential (primary) hypertension: Secondary | ICD-10-CM

## 2016-12-31 DIAGNOSIS — Z794 Long term (current) use of insulin: Secondary | ICD-10-CM | POA: Insufficient documentation

## 2016-12-31 DIAGNOSIS — Z951 Presence of aortocoronary bypass graft: Secondary | ICD-10-CM | POA: Diagnosis not present

## 2016-12-31 DIAGNOSIS — Z79899 Other long term (current) drug therapy: Secondary | ICD-10-CM | POA: Insufficient documentation

## 2016-12-31 DIAGNOSIS — I480 Paroxysmal atrial fibrillation: Secondary | ICD-10-CM | POA: Diagnosis not present

## 2016-12-31 DIAGNOSIS — I495 Sick sinus syndrome: Secondary | ICD-10-CM

## 2016-12-31 DIAGNOSIS — Z87891 Personal history of nicotine dependence: Secondary | ICD-10-CM | POA: Insufficient documentation

## 2016-12-31 DIAGNOSIS — Z7901 Long term (current) use of anticoagulants: Secondary | ICD-10-CM | POA: Insufficient documentation

## 2016-12-31 DIAGNOSIS — Z7982 Long term (current) use of aspirin: Secondary | ICD-10-CM | POA: Insufficient documentation

## 2016-12-31 MED ORDER — INSULIN DEGLUDEC 100 UNIT/ML ~~LOC~~ SOPN: 58.0000 [IU] | PEN_INJECTOR | Freq: Every day | SUBCUTANEOUS | 0 refills | Status: DC

## 2016-12-31 NOTE — Patient Instructions (Signed)
Medication Instructions:  Your physician recommends that you continue on your current medications as directed. Please refer to the Current Medication list given to you today.   Labwork: None ordered   Testing/Procedures: None ordered   Follow-Up: Your physician wants you to follow-up in: 6 months with device clinic and 12 months with Chanetta Marshall, NP You will receive a reminder letter in the mail two months in advance. If you don't receive a letter, please call our office to schedule the follow-up appointment.   Any Other Special Instructions Will Be Listed Below (If Applicable).     If you need a refill on your cardiac medications before your next appointment, please call your pharmacy.
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
## 2017-01-01 ENCOUNTER — Inpatient Hospital Stay (HOSPITAL_COMMUNITY)
Admission: EM | Admit: 2017-01-01 | Discharge: 2017-01-03 | DRG: 690 | Disposition: A | Discharge status: Home / Self Care | Payer: BLUE CROSS/BLUE SHIELD | Attending: Internal Medicine | Admitting: Internal Medicine

## 2017-01-01 ENCOUNTER — Emergency Department (HOSPITAL_COMMUNITY): Payer: BLUE CROSS/BLUE SHIELD

## 2017-01-01 ENCOUNTER — Encounter (HOSPITAL_COMMUNITY)
Admission: EM | Disposition: A | Discharge status: Home / Self Care | Payer: Self-pay | Source: Home / Self Care | Attending: Internal Medicine

## 2017-01-01 ENCOUNTER — Inpatient Hospital Stay (HOSPITAL_COMMUNITY): Payer: BLUE CROSS/BLUE SHIELD | Admitting: Anesthesiology

## 2017-01-01 ENCOUNTER — Encounter (HOSPITAL_COMMUNITY): Payer: Self-pay | Admitting: Oncology

## 2017-01-01 DIAGNOSIS — Z823 Family history of stroke: Secondary | ICD-10-CM | POA: Diagnosis not present

## 2017-01-01 DIAGNOSIS — N200 Calculus of kidney: Secondary | ICD-10-CM | POA: Diagnosis not present

## 2017-01-01 DIAGNOSIS — R52 Pain, unspecified: Secondary | ICD-10-CM

## 2017-01-01 DIAGNOSIS — R109 Unspecified abdominal pain: Secondary | ICD-10-CM | POA: Diagnosis not present

## 2017-01-01 DIAGNOSIS — Z466 Encounter for fitting and adjustment of urinary device: Secondary | ICD-10-CM | POA: Diagnosis not present

## 2017-01-01 DIAGNOSIS — N201 Calculus of ureter: Secondary | ICD-10-CM | POA: Diagnosis not present

## 2017-01-01 DIAGNOSIS — I5042 Chronic combined systolic (congestive) and diastolic (congestive) heart failure: Secondary | ICD-10-CM | POA: Diagnosis present

## 2017-01-01 DIAGNOSIS — I4581 Long QT syndrome: Secondary | ICD-10-CM | POA: Diagnosis not present

## 2017-01-01 DIAGNOSIS — I130 Hypertensive heart and chronic kidney disease with heart failure and stage 1 through stage 4 chronic kidney disease, or unspecified chronic kidney disease: Secondary | ICD-10-CM | POA: Diagnosis present

## 2017-01-01 DIAGNOSIS — N136 Pyonephrosis: Secondary | ICD-10-CM | POA: Diagnosis not present

## 2017-01-01 DIAGNOSIS — Z951 Presence of aortocoronary bypass graft: Secondary | ICD-10-CM

## 2017-01-01 DIAGNOSIS — I252 Old myocardial infarction: Secondary | ICD-10-CM

## 2017-01-01 DIAGNOSIS — Z820 Family history of epilepsy and other diseases of the nervous system: Secondary | ICD-10-CM | POA: Diagnosis not present

## 2017-01-01 DIAGNOSIS — N3001 Acute cystitis with hematuria: Secondary | ICD-10-CM

## 2017-01-01 DIAGNOSIS — I480 Paroxysmal atrial fibrillation: Secondary | ICD-10-CM | POA: Diagnosis present

## 2017-01-01 DIAGNOSIS — E1151 Type 2 diabetes mellitus with diabetic peripheral angiopathy without gangrene: Secondary | ICD-10-CM | POA: Diagnosis present

## 2017-01-01 DIAGNOSIS — N183 Chronic kidney disease, stage 3 unspecified: Secondary | ICD-10-CM

## 2017-01-01 DIAGNOSIS — Z87891 Personal history of nicotine dependence: Secondary | ICD-10-CM

## 2017-01-01 DIAGNOSIS — J45909 Unspecified asthma, uncomplicated: Secondary | ICD-10-CM | POA: Diagnosis present

## 2017-01-01 DIAGNOSIS — Z79899 Other long term (current) drug therapy: Secondary | ICD-10-CM | POA: Diagnosis not present

## 2017-01-01 DIAGNOSIS — Z87442 Personal history of urinary calculi: Secondary | ICD-10-CM

## 2017-01-01 DIAGNOSIS — Z955 Presence of coronary angioplasty implant and graft: Secondary | ICD-10-CM

## 2017-01-01 DIAGNOSIS — Z6841 Body Mass Index (BMI) 40.0 and over, adult: Secondary | ICD-10-CM

## 2017-01-01 DIAGNOSIS — N132 Hydronephrosis with renal and ureteral calculous obstruction: Secondary | ICD-10-CM | POA: Diagnosis not present

## 2017-01-01 DIAGNOSIS — K219 Gastro-esophageal reflux disease without esophagitis: Secondary | ICD-10-CM | POA: Diagnosis present

## 2017-01-01 DIAGNOSIS — Z794 Long term (current) use of insulin: Secondary | ICD-10-CM

## 2017-01-01 DIAGNOSIS — I2510 Atherosclerotic heart disease of native coronary artery without angina pectoris: Secondary | ICD-10-CM | POA: Diagnosis not present

## 2017-01-01 DIAGNOSIS — E785 Hyperlipidemia, unspecified: Secondary | ICD-10-CM | POA: Diagnosis not present

## 2017-01-01 DIAGNOSIS — D649 Anemia, unspecified: Secondary | ICD-10-CM | POA: Diagnosis present

## 2017-01-01 DIAGNOSIS — N3000 Acute cystitis without hematuria: Secondary | ICD-10-CM | POA: Diagnosis not present

## 2017-01-01 DIAGNOSIS — R1031 Right lower quadrant pain: Secondary | ICD-10-CM | POA: Diagnosis not present

## 2017-01-01 DIAGNOSIS — G4733 Obstructive sleep apnea (adult) (pediatric): Secondary | ICD-10-CM | POA: Diagnosis present

## 2017-01-01 DIAGNOSIS — Z801 Family history of malignant neoplasm of trachea, bronchus and lung: Secondary | ICD-10-CM | POA: Diagnosis not present

## 2017-01-01 DIAGNOSIS — E119 Type 2 diabetes mellitus without complications: Secondary | ICD-10-CM | POA: Diagnosis present

## 2017-01-01 DIAGNOSIS — B9689 Other specified bacterial agents as the cause of diseases classified elsewhere: Secondary | ICD-10-CM | POA: Diagnosis not present

## 2017-01-01 DIAGNOSIS — E1122 Type 2 diabetes mellitus with diabetic chronic kidney disease: Secondary | ICD-10-CM | POA: Diagnosis present

## 2017-01-01 DIAGNOSIS — I25810 Atherosclerosis of coronary artery bypass graft(s) without angina pectoris: Secondary | ICD-10-CM | POA: Diagnosis not present

## 2017-01-01 DIAGNOSIS — Z8249 Family history of ischemic heart disease and other diseases of the circulatory system: Secondary | ICD-10-CM

## 2017-01-01 DIAGNOSIS — R112 Nausea with vomiting, unspecified: Secondary | ICD-10-CM | POA: Diagnosis not present

## 2017-01-01 DIAGNOSIS — Z7901 Long term (current) use of anticoagulants: Secondary | ICD-10-CM

## 2017-01-01 HISTORY — DX: Chronic kidney disease, stage 3 unspecified: N18.30

## 2017-01-01 HISTORY — PX: CYSTOSCOPY W/ URETERAL STENT PLACEMENT: SHX1429

## 2017-01-01 HISTORY — DX: Calculus of kidney: N20.0

## 2017-01-01 HISTORY — DX: Pyonephrosis: N13.6

## 2017-01-01 LAB — CBC WITH DIFFERENTIAL/PLATELET
BASOS ABS: 0 10*3/uL (ref 0.0–0.1)
Basophils Relative: 0 %
Eosinophils Absolute: 0.3 10*3/uL (ref 0.0–0.7)
Eosinophils Relative: 2 %
HCT: 34 % — ABNORMAL LOW (ref 39.0–52.0)
Hemoglobin: 10.6 g/dL — ABNORMAL LOW (ref 13.0–17.0)
LYMPHS ABS: 1.5 10*3/uL (ref 0.7–4.0)
LYMPHS PCT: 11 %
MCH: 24.9 pg — AB (ref 26.0–34.0)
MCHC: 31.2 g/dL (ref 30.0–36.0)
MCV: 79.8 fL (ref 78.0–100.0)
Monocytes Absolute: 0.7 10*3/uL (ref 0.1–1.0)
Monocytes Relative: 5 %
NEUTROS ABS: 11.4 10*3/uL — AB (ref 1.7–7.7)
Neutrophils Relative %: 82 %
Platelets: 454 10*3/uL — ABNORMAL HIGH (ref 150–400)
RBC: 4.26 MIL/uL (ref 4.22–5.81)
RDW: 16 % — ABNORMAL HIGH (ref 11.5–15.5)
WBC: 13.9 10*3/uL — AB (ref 4.0–10.5)

## 2017-01-01 LAB — CBG MONITORING, ED: GLUCOSE-CAPILLARY: 165 mg/dL — AB (ref 65–99)

## 2017-01-01 LAB — URINALYSIS, ROUTINE W REFLEX MICROSCOPIC
Bilirubin Urine: NEGATIVE
Glucose, UA: NEGATIVE mg/dL
Ketones, ur: NEGATIVE mg/dL
NITRITE: NEGATIVE
PROTEIN: 30 mg/dL — AB
SPECIFIC GRAVITY, URINE: 1.016 (ref 1.005–1.030)
pH: 5 (ref 5.0–8.0)

## 2017-01-01 LAB — GLUCOSE, CAPILLARY
GLUCOSE-CAPILLARY: 162 mg/dL — AB (ref 65–99)
Glucose-Capillary: 143 mg/dL — ABNORMAL HIGH (ref 65–99)
Glucose-Capillary: 132 mg/dL — ABNORMAL HIGH (ref 65–99)
Glucose-Capillary: 181 mg/dL — ABNORMAL HIGH (ref 65–99)

## 2017-01-01 LAB — I-STAT CHEM 8, ED
BUN: 18 mg/dL (ref 6–20)
CHLORIDE: 105 mmol/L (ref 101–111)
CREATININE: 1.3 mg/dL — AB (ref 0.61–1.24)
Calcium, Ion: 1.19 mmol/L (ref 1.15–1.40)
GLUCOSE: 158 mg/dL — AB (ref 65–99)
HCT: 35 % — ABNORMAL LOW (ref 39.0–52.0)
Hemoglobin: 11.9 g/dL — ABNORMAL LOW (ref 13.0–17.0)
POTASSIUM: 4.7 mmol/L (ref 3.5–5.1)
Sodium: 139 mmol/L (ref 135–145)
TCO2: 23 mmol/L (ref 0–100)

## 2017-01-01 SURGERY — CYSTOSCOPY, WITH RETROGRADE PYELOGRAM AND URETERAL STENT INSERTION
Anesthesia: General | Site: Ureter | Laterality: Right

## 2017-01-01 MED ORDER — LIDOCAINE 2% (20 MG/ML) 5 ML SYRINGE
INTRAMUSCULAR | Status: DC | PRN
  Administered 2017-01-01: 100 mg via INTRAVENOUS

## 2017-01-01 MED ORDER — ONDANSETRON HCL 4 MG/2ML IJ SOLN
INTRAMUSCULAR | Status: DC | PRN
  Administered 2017-01-01: 4 mg via INTRAVENOUS

## 2017-01-01 MED ORDER — INSULIN ASPART 100 UNIT/ML ~~LOC~~ SOLN
0.0000 [IU] | Freq: Three times a day (TID) | SUBCUTANEOUS | Status: DC
  Administered 2017-01-01: 4 [IU] via SUBCUTANEOUS
  Administered 2017-01-01 – 2017-01-02 (×2): 3 [IU] via SUBCUTANEOUS
  Administered 2017-01-02: 4 [IU] via SUBCUTANEOUS
  Administered 2017-01-02: 11 [IU] via SUBCUTANEOUS
  Administered 2017-01-03: 3 [IU] via SUBCUTANEOUS

## 2017-01-01 MED ORDER — ONDANSETRON HCL 4 MG/2ML IJ SOLN
4.0000 mg | Freq: Four times a day (QID) | INTRAMUSCULAR | Status: DC | PRN
Start: 2017-01-01 — End: 2017-01-01

## 2017-01-01 MED ORDER — SODIUM CHLORIDE 0.9 % IV SOLN: INTRAVENOUS | Status: AC

## 2017-01-01 MED ORDER — RIVAROXABAN 20 MG PO TABS
20.0000 mg | ORAL_TABLET | Freq: Every day | ORAL | Status: DC
  Administered 2017-01-01 – 2017-01-02 (×2): 20 mg via ORAL
  Filled 2017-01-01 (×2): qty 1

## 2017-01-01 MED ORDER — SODIUM CHLORIDE 0.9 % IR SOLN
Status: DC | PRN
  Administered 2017-01-01: 3000 mL

## 2017-01-01 MED ORDER — LACTATED RINGERS IV SOLN
INTRAVENOUS | Status: DC | PRN
  Administered 2017-01-01: 08:00:00 via INTRAVENOUS

## 2017-01-01 MED ORDER — ONDANSETRON HCL 4 MG/2ML IJ SOLN
INTRAMUSCULAR | Status: AC
  Filled 2017-01-01: qty 2

## 2017-01-01 MED ORDER — ACETAMINOPHEN 325 MG PO TABS
650.0000 mg | ORAL_TABLET | Freq: Four times a day (QID) | ORAL | Status: DC | PRN
Start: 2017-01-01 — End: 2017-01-03
  Administered 2017-01-01 – 2017-01-03 (×3): 650 mg via ORAL
  Filled 2017-01-01 (×3): qty 2

## 2017-01-01 MED ORDER — ONDANSETRON HCL 4 MG PO TABS: 4.0000 mg | ORAL_TABLET | Freq: Four times a day (QID) | ORAL | Status: DC | PRN

## 2017-01-01 MED ORDER — INSULIN DEGLUDEC 100 UNIT/ML ~~LOC~~ SOPN: 58.0000 [IU] | PEN_INJECTOR | Freq: Every day | SUBCUTANEOUS | Status: DC

## 2017-01-01 MED ORDER — PROPOFOL 10 MG/ML IV BOLUS
INTRAVENOUS | Status: DC | PRN
  Administered 2017-01-01: 200 mg via INTRAVENOUS

## 2017-01-01 MED ORDER — 0.9 % SODIUM CHLORIDE (POUR BTL) OPTIME
TOPICAL | Status: DC | PRN
  Administered 2017-01-01: 1000 mL

## 2017-01-01 MED ORDER — FENTANYL CITRATE (PF) 100 MCG/2ML IJ SOLN
INTRAMUSCULAR | Status: AC
  Filled 2017-01-01: qty 2

## 2017-01-01 MED ORDER — LIDOCAINE 2% (20 MG/ML) 5 ML SYRINGE
INTRAMUSCULAR | Status: AC
  Filled 2017-01-01: qty 5

## 2017-01-01 MED ORDER — ONDANSETRON HCL 4 MG/2ML IJ SOLN
4.0000 mg | Freq: Once | INTRAMUSCULAR | Status: AC
  Administered 2017-01-01: 4 mg via INTRAVENOUS
  Filled 2017-01-01: qty 2

## 2017-01-01 MED ORDER — KETOROLAC TROMETHAMINE 15 MG/ML IJ SOLN: 15.0000 mg | Freq: Three times a day (TID) | INTRAMUSCULAR | Status: AC | PRN

## 2017-01-01 MED ORDER — TAMSULOSIN HCL 0.4 MG PO CAPS
0.4000 mg | ORAL_CAPSULE | Freq: Every day | ORAL | Status: DC
  Administered 2017-01-02 – 2017-01-03 (×2): 0.4 mg via ORAL
  Filled 2017-01-01 (×2): qty 1

## 2017-01-01 MED ORDER — INSULIN GLARGINE 100 UNIT/ML ~~LOC~~ SOLN
58.0000 [IU] | Freq: Every day | SUBCUTANEOUS | Status: DC
  Administered 2017-01-01 – 2017-01-02 (×2): 58 [IU] via SUBCUTANEOUS
  Filled 2017-01-01 (×2): qty 0.58

## 2017-01-01 MED ORDER — PROPOFOL 10 MG/ML IV BOLUS
INTRAVENOUS | Status: AC
  Filled 2017-01-01: qty 40

## 2017-01-01 MED ORDER — SUCCINYLCHOLINE CHLORIDE 200 MG/10ML IV SOSY
PREFILLED_SYRINGE | INTRAVENOUS | Status: AC
  Filled 2017-01-01: qty 10

## 2017-01-01 MED ORDER — ASPIRIN EC 81 MG PO TBEC
81.0000 mg | DELAYED_RELEASE_TABLET | Freq: Every day | ORAL | Status: DC
  Administered 2017-01-01 – 2017-01-03 (×3): 81 mg via ORAL
  Filled 2017-01-01 (×3): qty 1

## 2017-01-01 MED ORDER — SENNOSIDES-DOCUSATE SODIUM 8.6-50 MG PO TABS: 1.0000 | ORAL_TABLET | Freq: Every evening | ORAL | Status: DC | PRN

## 2017-01-01 MED ORDER — KETOROLAC TROMETHAMINE 30 MG/ML IJ SOLN
30.0000 mg | Freq: Once | INTRAMUSCULAR | Status: AC
  Administered 2017-01-01: 30 mg via INTRAVENOUS
  Filled 2017-01-01: qty 1

## 2017-01-01 MED ORDER — INSULIN ASPART 100 UNIT/ML ~~LOC~~ SOLN: 0.0000 [IU] | Freq: Every day | SUBCUTANEOUS | Status: DC

## 2017-01-01 MED ORDER — ACETAMINOPHEN 650 MG RE SUPP
650.0000 mg | Freq: Four times a day (QID) | RECTAL | Status: DC | PRN
  Filled 2017-01-01: qty 1

## 2017-01-01 MED ORDER — SOTALOL HCL 120 MG PO TABS
120.0000 mg | ORAL_TABLET | Freq: Two times a day (BID) | ORAL | Status: DC
  Administered 2017-01-01 – 2017-01-03 (×5): 120 mg via ORAL
  Filled 2017-01-01 (×5): qty 1

## 2017-01-01 MED ORDER — DEXTROSE 5 % IV SOLN
1.0000 g | INTRAVENOUS | Status: DC
  Filled 2017-01-01 (×2): qty 10

## 2017-01-01 MED ORDER — CEFTRIAXONE SODIUM 1 G IJ SOLR
1.0000 g | Freq: Once | INTRAMUSCULAR | Status: AC
  Administered 2017-01-01: 1 g via INTRAVENOUS
  Filled 2017-01-01: qty 10

## 2017-01-01 MED ORDER — SODIUM CHLORIDE 0.9 % IV SOLN
INTRAVENOUS | Status: DC | PRN
  Administered 2017-01-01: 5 mL

## 2017-01-01 MED ORDER — FENTANYL CITRATE (PF) 100 MCG/2ML IJ SOLN: 25.0000 ug | INTRAMUSCULAR | Status: DC | PRN

## 2017-01-01 MED ORDER — PANTOPRAZOLE SODIUM 40 MG PO TBEC
40.0000 mg | DELAYED_RELEASE_TABLET | Freq: Every day | ORAL | Status: DC
  Administered 2017-01-01 – 2017-01-03 (×3): 40 mg via ORAL
  Filled 2017-01-01 (×3): qty 1

## 2017-01-01 MED ORDER — PROMETHAZINE HCL 25 MG/ML IJ SOLN
6.2500 mg | INTRAMUSCULAR | Status: DC | PRN
  Filled 2017-01-01: qty 1

## 2017-01-01 MED ORDER — DEXTROSE 5 % IV SOLN
1.0000 g | INTRAVENOUS | Status: DC
  Administered 2017-01-02 – 2017-01-03 (×2): 1 g via INTRAVENOUS
  Filled 2017-01-01 (×2): qty 10

## 2017-01-01 MED ORDER — ROSUVASTATIN CALCIUM 5 MG PO TABS
5.0000 mg | ORAL_TABLET | ORAL | Status: DC
  Administered 2017-01-03: 5 mg via ORAL
  Filled 2017-01-01: qty 1

## 2017-01-01 SURGICAL SUPPLY — 13 items
BAG URO CATCHER STRL LF (MISCELLANEOUS) ×2 IMPLANT
CATH INTERMIT  6FR 70CM (CATHETERS) IMPLANT
CLOTH BEACON ORANGE TIMEOUT ST (SAFETY) ×2 IMPLANT
GLOVE BIOGEL M 8.0 STRL (GLOVE) ×2 IMPLANT
GOWN STRL REUS W/ TWL XL LVL3 (GOWN DISPOSABLE) ×1 IMPLANT
GOWN STRL REUS W/TWL LRG LVL3 (GOWN DISPOSABLE) ×2 IMPLANT
GOWN STRL REUS W/TWL XL LVL3 (GOWN DISPOSABLE) ×1
GUIDEWIRE ANG ZIPWIRE 038X150 (WIRE) IMPLANT
GUIDEWIRE STR DUAL SENSOR (WIRE) ×2 IMPLANT
MANIFOLD NEPTUNE II (INSTRUMENTS) ×2 IMPLANT
PACK CYSTO (CUSTOM PROCEDURE TRAY) ×2 IMPLANT
STENT URET 6FRX24 CONTOUR (STENTS) ×2 IMPLANT
TUBING CONNECTING 10 (TUBING) ×2 IMPLANT
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
## 2017-01-01 NOTE — Op Note (Signed)
Preoperative diagnosis: Infected, obstructing right ureteral calculus.  Postoperative diagnosis: Same  Principal procedure: Cystoscopy, right retrograde ureteropyelogram with fluoroscopic interpretation, placement of 6 French by 24 centimeter contour double-J stent without tether  Surgeon: Addie Cederberg  Anesthesia: Gen. with LMA  Drains: Previously mentioned double-J stent.  Specimen: Urine from right ureter for culture.  Complications: None.  Estimated blood loss: None  Indications: 71 year old male with history of urolithiasis, admitted through the emergency room today with a history of an Enterobacter UTI, now diagnosed with a moderate size right midureteral stone with significant hydronephrosis.  He presents for cystoscopy and stent placement on an urgent basis to drain his right kidney to alleviate obstruction and to help with treatment of his urinary tract infection.  The procedure as well as risks and complications have been discussed with the patient and his wife.  They understand and desire to proceed.  Description of procedure: The patient was properly identified in the holding area.  His surgical site was marked.  He received preoperative IV antibiotics in the emergency room.  He was taken to the operating room where general anesthesia was administered with the LMA.  He was placed in the dorsolithotomy position.  Genitalia and perineum were prepped and draped.  Proper timeout was performed.  A 21 French panendoscope was advanced under direct vision through his urethra which was normal.  Prostate was nonobstructive.  His bladder was entered.  It was inspected circumferentially with the Foroblique lens.  It was found to be normal, without urothelial abnormalities.  Ureteral orifices were normal in configuration and location.  The right ureteral orifice was cannulated with a 6 [place] open-ended catheter.  Gentle retrograde ureteropyelogram was performed using Omnipaque.  This revealed a  normal distal ureter.  There is a filling defect in the mid ureter with proximal hydro-ureteral nephrosis and pyelocaliectasis.  No significant filling defects were seen other than that.  The catheter was then advanced into the upper ureter with the assistance of the sensor-tip guidewire.  Once in the upper ureter/renal pelvis, the guidewire was removed and urine was obtained for culture.  This was sent for routine culture.  Following this, the guidewire was replaced and the open-ended catheter removed.  A 6 French by 24 centimeter contour double-J stent was then advanced under direct vision over the guidewire and up the right ureter.  Using fluoroscopic guidance, it was advanced into the right renal pelvic/upper pole calyceal area.  Once adequate positioning was obtained, the guidewire was removed and the stent deployed.  Good distal curl was seen within the bladder.  At this point, the bladder was drained with a scope and the scope was removed.  The patient was then awakened.  He was then taken to PACU in stable condition, having tolerated the procedure well.

## 2017-01-01 NOTE — Anesthesia Postprocedure Evaluation (Signed)
Anesthesia Post Note  Patient: Charles [name]  Procedure(s) Performed: Procedure(s) (LRB): CYSTOSCOPY WITH RETROGRADE PYELOGRAM/ RIGHT URETERAL STENT PLACEMENT (Right)  Patient location during evaluation: PACU Anesthesia Type: General Level of consciousness: awake and alert Pain management: pain level controlled Vital Signs Assessment: post-procedure vital signs reviewed and stable Respiratory status: spontaneous breathing, nonlabored ventilation, respiratory function stable and patient connected to nasal cannula oxygen Cardiovascular status: blood pressure returned to baseline and stable Postop Assessment: no signs of nausea or vomiting Anesthetic complications: no       Last Vitals:  Vitals:   01/01/17 0930 01/01/17 0945  BP: (!) 152/92   Pulse: 73 73  Resp: 16 13  Temp: 36.4 C 36.4 C    Last Pain:  Vitals:   01/01/17 0743  TempSrc:   PainSc: 0-No pain                 Tiajuana Amass
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
## 2017-01-01 NOTE — Progress Notes (Signed)
ANTICOAGULATION CONSULT NOTE - Initial Consult  Pharmacy Consult for Xarelto Indication: atrial fibrillation  Allergies  Allergen Reactions  . Chocolate Hives, Shortness Of Breath and Swelling  . Statins Other (See Comments)    Mental changes, muscle aches  . Black Pepper [[place]] Hives  . Codeine Itching  . Oxytetracycline Other (See Comments)    Flushing in sunlight  . Tape Rash and Other (See Comments)    SKIN IS VERY SENSITIVE!!    Patient Measurements: Height: 5\' 9"  (175.3 cm) Weight: 283 lb 15.2 oz (128.8 kg) IBW/kg (Calculated) : 70.7  Labs:  Recent Labs  01/01/17 0500 01/01/17 0515  HGB 10.6* 11.9*  HCT 34.0* 35.0*  PLT 454*  --   CREATININE  --  1.30*    Estimated Creatinine Clearance: 70.2 mL/min (by C-G formula based on SCr of 1.3 mg/dL (H)).   Medical History: Past Medical History:  Diagnosis Date  . Arthritis    "knees, hands, lower back" (07/29/2016)  . Asthma    "touch q once & awhile" (02/05/2016)  . Chronic bronchitis ([place])   . Chronic venous insufficiency    with prior venous stasis ulcers  . Complication of anesthesia    "when coming out, I choke and get very restless if breathing tube is still in"  . Coronary artery disease    a. history of multiple stents to the LCx, LAD, and RCA b. s/p CABG in 08/2016 with LIMA-LAD, SVG-OM, SVG-PDA, and SVG-D1  . Diabetes mellitus, type II ([place])    AODM  . Dyslipidemia   . Exogenous obesity    severe  . History of blood transfusion ~ 2015   related to "when they went in to get my kidney stones"  . Hx of colonic polyps 09/2006   inflammatory polyp at hepatic flexure. not adenomatous or malignant.   . Hypertension   . LBBB (left bundle branch block)    He has developed a native LBBB which was seen on his last visit of March 2014 (From OV note 07/03/13)   . Long term (current) use of anticoagulants   . MI (myocardial infarction) 1995   "mild"  . Nephrolithiasis   . OSA on CPAP    "nasal CPAP"  (07/29/2016)  . Osteoarthritis, knee   . PAF (paroxysmal atrial fibrillation) ([place])    a. on Xarelto  . 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 Jude Zephyr XL DR 5826, dual chamber, rate responsive. No arrhythmias recorded and he has an excellent threshold.  . SSS (sick sinus syndrome) ([place])   . Statin intolerance    Hx of. Now tolerating Zetia & Livalo well.   [name] Unstable angina ([place]) 08/2016    Medications:  Prescriptions Prior to Admission  Medication Sig Dispense Refill Last Dose  . alfuzosin (UROXATRAL) 10 MG 24 hr tablet Take 10 mg by mouth daily with breakfast.   12/31/2016 at Unknown time  . aspirin EC 81 MG EC tablet Take 1 tablet (81 mg total) by mouth daily.   12/31/2016 at 0900  . furosemide (LASIX) 40 MG tablet Take 1 tablet (40 mg total) by mouth daily. Daily for one week - then changing to just as needed - prn weight gain/swelling (Patient taking differently: Take 40 mg by mouth daily as needed for fluid or edema. ) 30 tablet 1 Past Month at Unknown time  . glucose blood test strip Check blood sugar three times a day. 100 each 12 Taking  . insulin aspart (NOVOLOG) 100  UNIT/ML injection Inject 5-15 units in the skin before meals (Patient taking differently: Inject 5-15 Units into the skin 3 (three) times daily with meals. Inject 5-15 units in the skin before meals ( 5 units with breakfast, 10 units with lunch, 15 units with dinner)) 10 mL 11 12/31/2016 at Unknown time  . insulin degludec (TRESIBA) 100 UNIT/ML SOPN FlexTouch Pen Inject 0.58 mLs (58 Units total) into the skin daily. 15 mL 0 12/31/2016 at Unknown time  . Insulin Syringe-Needle U-100 (B-D INS SYRINGE 0.5CC/30GX1/2") 30G X 1/2" 0.5 ML MISC Use to inject insulin 3 times daily 100 each 2 Taking  . lisinopril (PRINIVIL,ZESTRIL) 5 MG tablet Take 1 tablet (5 mg total) by mouth daily. 30 tablet 3 12/31/2016 at Unknown time  . metFORMIN (GLUCOPHAGE) 500 MG tablet Take 500 mg by mouth 3 (three) times daily. Take 3 tablets  by mouth daily    12/31/2016 at Unknown time  . pantoprazole (PROTONIX) 40 MG tablet Take 1 tablet (40 mg total) by mouth daily. 30 tablet 11 12/31/2016 at Unknown time  . rivaroxaban (XARELTO) 20 MG TABS tablet Take 1 tablet (20 mg total) by mouth daily with supper. 30 tablet 11 12/31/2016 at 1830  . rosuvastatin (CRESTOR) 5 MG tablet Take 1 tablet (5 mg total) by mouth every Monday, Wednesday, and Friday. 30 tablet 1 12/31/2016 at Unknown time  . sotalol (BETAPACE) 120 MG tablet Take 1 tablet (120 mg total) by mouth 2 (two) times daily. 60 tablet 10 12/31/2016 at 1830  . VENTOLIN HFA 108 (90 Base) MCG/ACT inhaler Inhale 2 puffs into the lungs 2 (two) times daily as needed for wheezing or shortness of breath.   0 unk  . oxyCODONE (OXY IR/ROXICODONE) 5 MG immediate release tablet Take 1-2 tablets (5-10 mg total) by mouth every 6 (six) hours as needed for severe pain. (Patient not taking: Reported on 01/01/2017) 28 tablet 0 Not Taking at Unknown time    Assessment: 50 yoM admitted on 2/10 with obstructing right midureteral stone.  S/p Cystoscopy, placement of right double-J stent on 2/10.  Pharmacy is consulted postop to resume Xarelto for Afib.  Confirmed with Dr. Quincy Simmonds and Dr. Diona Fanti that the patient is stable and safe to resume full dose anticoagulant.  Last home dose Xarelto 20mg  taken on 2/9 at 1830.  Today, 01/01/2017: SCr 1.3 CBC: Hgb 11.9, Plt 454  Plan:   Resume Xarelto 20mg  PO once daily with supper.  Dosage remains stable and need for further dosage adjustment appears unlikely at present.  Pharmacy will sign off at this time.  Please reconsult if a change in clinical status warrants re-evaluation of dosage.  Gretta Arab PharmD, BCPS Pager [phone] 01/01/2017 11:42 AM

## 2017-01-01 NOTE — ED Triage Notes (Signed)
Pt c/o right sided flank pain that has been intermittent x 2 days.  Pt w/ hx of kidney stones and states this feels similar.  Rating pain 8/10.

## 2017-01-01 NOTE — Transfer of Care (Signed)
Immediate Anesthesia Transfer of Care Note  Patient: Charles [name]  Procedure(s) Performed: Procedure(s): CYSTOSCOPY WITH RETROGRADE PYELOGRAM/ RIGHT URETERAL STENT PLACEMENT (Right)  Patient Location: PACU  Anesthesia Type:General  Level of Consciousness: awake and alert   Airway & Oxygen Therapy: Patient Spontanous Breathing and Patient connected to face mask oxygen  Post-op Assessment: Report given to RN and Post -op Vital signs reviewed and stable  Post vital signs: Reviewed and stable  Last Vitals:  Vitals:   01/01/17 0351 01/01/17 0655  BP: 154/97 162/90  Pulse: 70 64  Resp: 18 20  Temp: 36.6 C 36.4 C    Last Pain:  Vitals:   01/01/17 0743  TempSrc:   PainSc: 0-No pain         Complications: No apparent anesthesia complications

## 2017-01-01 NOTE — Anesthesia Procedure Notes (Signed)
Procedure Name: LMA Insertion Date/Time: 01/01/2017 8:25 AM Performed by: Danley Danker L Patient Re-evaluated:Patient Re-evaluated prior to inductionOxygen Delivery Method: Circle system utilized Preoxygenation: Pre-oxygenation with 100% oxygen Intubation Type: IV induction Ventilation: Mask ventilation without difficulty LMA: LMA inserted LMA Size: 4.0 Number of attempts: 1 Placement Confirmation: positive ETCO2 and breath sounds checked- equal and bilateral Tube secured with: Tape Dental Injury: Teeth and Oropharynx as per pre-operative assessment

## 2017-01-01 NOTE — Anesthesia Preprocedure Evaluation (Signed)
Anesthesia Evaluation  Patient identified by MRN, date of birth, ID band Patient awake    Reviewed: Allergy & Precautions, NPO status , Patient's Chart, lab work & pertinent test results  Airway Mallampati: II  TM Distance: >3 FB Neck ROM: Full    Dental  (+) Dental Advisory Given   Pulmonary asthma , sleep apnea , former smoker,    breath sounds clear to auscultation       Cardiovascular hypertension, Pt. on medications and Pt. on home beta blockers + angina + CAD, + Past MI, + Cardiac Stents, + CABG, + Peripheral Vascular Disease and +CHF  + dysrhythmias Atrial Fibrillation + pacemaker  Rhythm:Regular Rate:Normal  Left ventricle: Abnormal septal motion The cavity size was mildly   dilated. Wall thickness was increased in a pattern of severe LVH.   Systolic function was mildly reduced. The estimated ejection   fraction was in the range of 45% to 50%. Wall motion was normal;   there were no regional wall motion abnormalities. Doppler   parameters are consistent with abnormal left ventricular   relaxation (grade 1 diastolic dysfunction). - Left atrium: The atrium was mildly dilated. - Atrial septum: No defect or patent foramen ovale was identified.   Neuro/Psych negative neurological ROS     GI/Hepatic Neg liver ROS, GERD  ,  Endo/Other  diabetes, Type 2Morbid obesity  Renal/GU CRFRenal disease     Musculoskeletal   Abdominal   Peds  Hematology  (+) anemia ,   Anesthesia Other Findings   Reproductive/Obstetrics                             Lab Results  Component Value Date   WBC 13.9 (H) 01/01/2017   HGB 11.9 (L) 01/01/2017   HCT 35.0 (L) 01/01/2017   MCV 79.8 01/01/2017   PLT 454 (H) 01/01/2017   Lab Results  Component Value Date   CREATININE 1.30 (H) 01/01/2017   BUN 18 01/01/2017   NA 139 01/01/2017   K 4.7 01/01/2017   CL 105 01/01/2017   CO2 28 10/27/2016    Anesthesia  Physical  Anesthesia Plan  ASA: III  Anesthesia Plan: General   Post-op Pain Management:    Induction: Intravenous  Airway Management Planned: Oral ETT and LMA  Additional Equipment: None  Intra-op Plan:   Post-operative Plan: Extubation in OR  Informed Consent: I have reviewed the patients History and Physical, chart, labs and discussed the procedure including the risks, benefits and alternatives for the proposed anesthesia with the patient or authorized representative who has indicated his/her understanding and acceptance.   Dental advisory given  Plan Discussed with: CRNA  Anesthesia Plan Comments:         Anesthesia Quick Evaluation

## 2017-01-02 DIAGNOSIS — N136 Pyonephrosis: Principal | ICD-10-CM

## 2017-01-02 DIAGNOSIS — E119 Type 2 diabetes mellitus without complications: Secondary | ICD-10-CM

## 2017-01-02 DIAGNOSIS — N183 Chronic kidney disease, stage 3 (moderate): Secondary | ICD-10-CM

## 2017-01-02 DIAGNOSIS — I480 Paroxysmal atrial fibrillation: Secondary | ICD-10-CM

## 2017-01-02 DIAGNOSIS — N200 Calculus of kidney: Secondary | ICD-10-CM

## 2017-01-02 LAB — URINE CULTURE: CULTURE: NO GROWTH

## 2017-01-02 LAB — CBC
HCT: 33 % — ABNORMAL LOW (ref 39.0–52.0)
Hemoglobin: 10.2 g/dL — ABNORMAL LOW (ref 13.0–17.0)
MCH: 25.1 pg — ABNORMAL LOW (ref 26.0–34.0)
MCHC: 30.9 g/dL (ref 30.0–36.0)
MCV: 81.3 fL (ref 78.0–100.0)
PLATELETS: 385 10*3/uL (ref 150–400)
RBC: 4.06 MIL/uL — ABNORMAL LOW (ref 4.22–5.81)
RDW: 16.1 % — AB (ref 11.5–15.5)
WBC: 9.6 10*3/uL (ref 4.0–10.5)

## 2017-01-02 LAB — COMPREHENSIVE METABOLIC PANEL
ALBUMIN: 3 g/dL — AB (ref 3.5–5.0)
ALT: 16 U/L — AB (ref 17–63)
AST: 14 U/L — AB (ref 15–41)
Alkaline Phosphatase: 107 U/L (ref 38–126)
Anion gap: 7 (ref 5–15)
BILIRUBIN TOTAL: 0.1 mg/dL — AB (ref 0.3–1.2)
BUN: 22 mg/dL — AB (ref 6–20)
CO2: 26 mmol/L (ref 22–32)
CREATININE: 1.4 mg/dL — AB (ref 0.61–1.24)
Calcium: 8.5 mg/dL — ABNORMAL LOW (ref 8.9–10.3)
Chloride: 103 mmol/L (ref 101–111)
GFR calc Af Amer: 57 mL/min — ABNORMAL LOW (ref >60)
GFR, EST NON AFRICAN AMERICAN: 49 mL/min — AB (ref >60)
GLUCOSE: 174 mg/dL — AB (ref 65–99)
Potassium: 4.7 mmol/L (ref 3.5–5.1)
Sodium: 136 mmol/L (ref 135–145)
TOTAL PROTEIN: 7.1 g/dL (ref 6.5–8.1)

## 2017-01-02 LAB — GLUCOSE, CAPILLARY
GLUCOSE-CAPILLARY: 259 mg/dL — AB (ref 65–99)
GLUCOSE-CAPILLARY: 148 mg/dL — AB (ref 65–99)
Glucose-Capillary: 155 mg/dL — ABNORMAL HIGH (ref 65–99)
Glucose-Capillary: 144 mg/dL — ABNORMAL HIGH (ref 65–99)

## 2017-01-02 MED ORDER — POLYETHYLENE GLYCOL 3350 17 G PO PACK
17.0000 g | PACK | Freq: Every day | ORAL | Status: DC
  Administered 2017-01-02 – 2017-01-03 (×2): 17 g via ORAL
  Filled 2017-01-02 (×2): qty 1

## 2017-01-02 MED ORDER — HYDRALAZINE HCL 20 MG/ML IJ SOLN: 10.0000 mg | Freq: Four times a day (QID) | INTRAMUSCULAR | Status: DC | PRN

## 2017-01-02 NOTE — Progress Notes (Signed)
1 Day Post-Op Subjective: Patient reports he's feeling well. We talked airplanes for quite some time.   Objective: Vital signs in last 24 hours: Temp:  [97.7 F (36.5 C)-97.8 F (36.6 C)] 97.8 F (36.6 C) (02/11 0413) Pulse Rate:  [69-71] 69 (02/11 0413) Resp:  [18-20] 20 (02/11 0413) BP: (149-155)/(85-89) 154/89 (02/11 0413) SpO2:  [98 %-100 %] 99 % (02/11 0413) Weight:  [126.5 kg (278 lb 12.8 oz)] 126.5 kg (278 lb 12.8 oz) (02/11 0413)  Intake/Output from previous day: 02/10 0701 - 02/11 0700 In: 1370 [P.O.:720; I.V.:600; IV Piggyback:50] Out: 2035 [Urine:2035] Intake/Output this shift: No intake/output data recorded.  Physical Exam:  NAD Sitting in chair - watching TV  Lab Results:  Recent Labs  01/01/17 0500 01/01/17 0515 01/02/17 0457  HGB 10.6* 11.9* 10.2*  HCT 34.0* 35.0* 33.0*   BMET  Recent Labs  01/01/17 0515 01/02/17 0457  NA 139 136  K 4.7 4.7  CL 105 103  CO2  --  26  GLUCOSE 158* 174*  BUN 18 22*  CREATININE 1.30* 1.40*  CALCIUM  --  8.5*   No results for input(s): LABPT, INR in the last 72 hours. No results for input(s): LABURIN in the last 72 hours. Results for orders placed or performed during the hospital encounter of 01/01/17  Urine culture     Status: None (Preliminary result)   Collection Time: 01/01/17  4:28 AM  Result Value Ref Range Status   Specimen Description URINE, CLEAN CATCH  Final   Special Requests NONE  Final   Culture   Final    CULTURE REINCUBATED FOR BETTER GROWTH Performed at [facility], [place] [address], [place], [place] [address]    Report Status PENDING  Incomplete  Urine culture     Status: None   Collection Time: 01/01/17  8:37 AM  Result Value Ref Range Status   Specimen Description URINE, CATHETERIZED URINE CYSTOSCOPE  Final   Special Requests NONE  Final   Culture   Final    NO GROWTH Performed at [facility], [address], [place], [place] [address]    Report Status 01/02/2017 FINAL   Final    Studies/Results: Ct Renal Stone Study  Result Date: 01/01/2017 CLINICAL DATA:  Intermittent right flank pain for 2 days. Previous history of kidney stones. EXAM: CT ABDOMEN AND PELVIS WITHOUT CONTRAST TECHNIQUE: Multidetector CT imaging of the abdomen and pelvis was performed following the standard protocol without IV contrast. COMPARISON:  10/21/2015 FINDINGS: Lower chest: The lung bases are clear. Postoperative median sternotomy. Small esophageal hiatal hernia. Hepatobiliary: Scattered calcified granulomas throughout the liver. subcentimeter cyst in segment 4 of the liver. Surgical absence of the gallbladder. No bile duct dilatation. Pancreas: Unremarkable. No pancreatic ductal dilatation or surrounding inflammatory changes. Spleen: Calcified granulomas in the spleen.  Normal size. Adrenals/Urinary Tract: No adrenal gland nodules. 5 mm stone in the mid/distal right ureter at the level of the sacrum. Proximal hydronephrosis and hydroureter. Prominent stranding around the right kidney and ureter. Additional intrarenal stones bilaterally. Largest on the left measures 7.6 mm diameter. No hydronephrosis or hydroureter on the left. Bladder wall is not thickened. Stomach/Bowel: Stomach, small bowel, and colon are mostly decompressed. Small duodenal diverticulum. Scattered stool throughout the colon. No bowel wall thickening or infiltration. Scattered colonic diverticula. Appendix is surgically absent. Vascular/Lymphatic: Aortic atherosclerosis. No enlarged abdominal or pelvic lymph nodes. Reproductive: Prostate is unremarkable. Other: No abdominal wall hernia or abnormality. No abdominopelvic ascites. Musculoskeletal: Degenerative  changes in the spine. No destructive bone lesions. IMPRESSION: 5 mm stone in the mid/distal right ureter with moderate proximal obstruction. Additional nonobstructing intrarenal stones bilaterally. Aortic atherosclerosis. Electronically Signed   By: Lucienne Capers M.D.   On:  01/01/2017 05:55    Assessment/Plan:  Right ureteral stone - s/p right stent. He usually follows with Dr. Jeffie Pollock and will need ureteroscopy in a few weeks. Cr 1.4 which is baseline.   UTI - enterobacter at outside hospital per consult note; repeat urine Cx here reincubated for better growth    I'll notify Dr. Jeffie Pollock of admission.    LOS: 1 day   Jahmiyah Dullea 01/02/2017, 10:02 AM

## 2017-01-02 NOTE — Progress Notes (Signed)
PROGRESS NOTE    REG UHLAND  [id number] DOB: 1946-05-03 DOA: 01/01/2017 PCP: No PCP Per Patient   Chief Complaint  Patient presents with  . Flank Pain    Brief Narrative:  HPI on 01/01/2017 by Dr. Martie Round Sherwin Eckrich is a 71 y.o. male with medical history significant of CAD status post CABG in 2017, PAF (on Xarelto), type 2 diabetes mellitus, hypertension, OSA (on CPAP). Patient presented to the ED complaining of acute severe right flank pain that started 2 days ago, pain has been worsening despite antibiotics and tramadol treatment. Associated symptoms include hematuria and nausea. Patient was recently hospitalized (1/31) at [facility] due to Enterobacter bacteremia from urinary source. Completed course of ceftriaxone on 2/6. Denies chest pain, SOB, dizziness, vomiting, diarrhea and constipation.   During previous hospitalization patient developed QTc prolongation after started on Cipro, as well with Bactrim. Assessment & Plan   Obstructing right ureteral stone -Urology consultation appreciated -Status post right stent placement -Continue ceftriaxone -Patient follows with Dr. Jeffie Pollock. Patient will need ureteroscopy   Recent history of bacteremia secondary to urinary tract infection -Patient recently admitted at outside facility and was given medications, ciprofloxacin, Bactrim -Urine culture and blood cultures pending -Patient currently afebrile. Leukocytosis resolved -Continue ceftriaxone  Chronic kidney disease, stage III -Creatinine appears to be at baseline, continue to monitor BMP  Diabetes mellitus, type II -Hemoglobin A1c in October 2017 6.7 -Metformin currently held -Continue lantus, ISS, CBG monitoring  Essential hypertension -Continue sotalol -Hydralazine PRN  Atrial fibrillation -Continue sotalol -Continue Xarelto  Chronic systolic/diastolic CHF -Currently appears to be euvolemic and compensated -Echocardiogram 08/03/2016  showed EF of 45 and [id number], grade 1 diastolic dysfunction -Monitor intake and output, daily weights -Lasix currently held  CAD status post CABG -Currently stable, no complaints of chest pain -Continue aspirin, statin  QTc prolongation -Currently stable  Chronic normocytic anemia -Hemoglobin currently 10.2, appears to be at baseline -Continue to monitor CBC  DVT Prophylaxis  Xarelto  Code Status: Full  Family Communication: None at bedside  Disposition Plan: Admitted.   Consultants Urology  Procedures  Cystoscopy, right retrograde ureteropyelogram with fluoroscopic interpretation, placement of 6 French by 24 centimeter contour double-J stent without tether  Antibiotics   Anti-infectives    Start     Dose/Rate Route Frequency Ordered Stop   01/02/17 0600  cefTRIAXone (ROCEPHIN) 1 g in dextrose 5 % 50 mL IVPB  Status:  Discontinued     1 g 100 mL/hr over 30 Minutes Intravenous Every 24 hours 01/01/17 0857 01/01/17 1018   01/02/17 0600  cefTRIAXone (ROCEPHIN) 1 g in dextrose 5 % 50 mL IVPB     1 g 100 mL/hr over 30 Minutes Intravenous Every 24 hours 01/01/17 1018     01/01/17 0515  cefTRIAXone (ROCEPHIN) 1 g in dextrose 5 % 50 mL IVPB     1 g 100 mL/hr over 30 Minutes Intravenous  Once 01/01/17 0505 01/01/17 0654      Subjective:   Betsy Coder seen and examined today.  Patient states he is feeling better. Denies chest pain, shortness of breath, abdominal pain, nausea or vomiting, diarrhea or constipation.  Objective:   Vitals:   01/01/17 1001 01/01/17 1546 01/01/17 2037 01/02/17 0413  BP: (!) 153/86 (!) 155/88 (!) 149/85 (!) 154/89  Pulse: 69 70 71 69  Resp: 14 18 19 20   Temp: 97.6 F (36.4 C) 97.7 F (36.5 C) 97.7 F (36.5 C) 97.8 F (36.6 C)  TempSrc:  Oral Oral Oral  SpO2: 99% 100% 98% 99%  Weight: 128.8 kg (283 lb 15.2 oz)   126.5 kg (278 lb 12.8 oz)  Height: 5\' 9"  (1.753 m)       Intake/Output Summary (Last 24 hours) at 01/02/17 1048 Last data filed  at 01/02/17 1000  Gross per 24 hour  Intake              770 ml  Output             2685 ml  Net            -1915 ml   Filed Weights   01/01/17 1001 01/02/17 0413  Weight: 128.8 kg (283 lb 15.2 oz) 126.5 kg (278 lb 12.8 oz)    Exam  General: Well developed, well nourished, NAD, appears stated age  HEENT: NCAT, mucous membranes moist.   Cardiovascular: S1 S2 auscultated, no rubs, murmurs or gallops. Regular rate and rhythm.  Respiratory: Clear to auscultation bilaterally with equal chest rise  Abdomen: Soft, obese, nontender, nondistended, + bowel sounds  Extremities: warm dry without cyanosis clubbing or edema  Neuro: AAOx3, nonfocal  Psych: Normal affect and demeanor   Data Reviewed: I have personally reviewed following labs and imaging studies  CBC:  Recent Labs Lab 01/01/17 0500 01/01/17 0515 01/02/17 0457  WBC 13.9*  --  9.6  NEUTROABS 11.4*  --   --   HGB 10.6* 11.9* 10.2*  HCT 34.0* 35.0* 33.0*  MCV 79.8  --  81.3  PLT 454*  --  0000000   Basic Metabolic Panel:  Recent Labs Lab 01/01/17 0515 01/02/17 0457  NA 139 136  K 4.7 4.7  CL 105 103  CO2  --  26  GLUCOSE 158* 174*  BUN 18 22*  CREATININE 1.30* 1.40*  CALCIUM  --  8.5*   GFR: Estimated Creatinine Clearance: 64.6 mL/min (by C-G formula based on SCr of 1.4 mg/dL (H)). Liver Function Tests:  Recent Labs Lab 01/02/17 0457  AST 14*  ALT 16*  ALKPHOS 107  BILITOT 0.1*  PROT 7.1  ALBUMIN 3.0*   No results for input(s): LIPASE, AMYLASE in the last 168 hours. No results for input(s): AMMONIA in the last 168 hours. Coagulation Profile: No results for input(s): INR, PROTIME in the last 168 hours. Cardiac Enzymes: No results for input(s): CKTOTAL, CKMB, CKMBINDEX, TROPONINI in the last 168 hours. BNP (last 3 results) No results for input(s): PROBNP in the last 8760 hours. HbA1C: No results for input(s): HGBA1C in the last 72 hours. CBG:  Recent Labs Lab 01/01/17 0922 01/01/17 1201  01/01/17 1717 01/01/17 2052 01/02/17 0727  GLUCAP 143* 132* 181* 162* 155*   Lipid Profile: No results for input(s): CHOL, HDL, LDLCALC, TRIG, CHOLHDL, LDLDIRECT in the last 72 hours. Thyroid Function Tests: No results for input(s): TSH, T4TOTAL, FREET4, T3FREE, THYROIDAB in the last 72 hours. Anemia Panel: No results for input(s): VITAMINB12, FOLATE, FERRITIN, TIBC, IRON, RETICCTPCT in the last 72 hours. Urine analysis:    Component Value Date/Time   COLORURINE YELLOW 01/01/2017 0428   APPEARANCEUR HAZY (A) 01/01/2017 0428   LABSPEC 1.016 01/01/2017 0428   PHURINE 5.0 01/01/2017 0428   GLUCOSEU NEGATIVE 01/01/2017 0428   GLUCOSEU NEGATIVE 10/27/2016 1350   HGBUR LARGE (A) 01/01/2017 0428   BILIRUBINUR NEGATIVE 01/01/2017 0428   KETONESUR NEGATIVE 01/01/2017 0428   PROTEINUR 30 (A) 01/01/2017 0428   UROBILINOGEN 0.2 10/27/2016 1350   NITRITE NEGATIVE 01/01/2017 0428   LEUKOCYTESUR LARGE (  A) 01/01/2017 0428   Sepsis Labs: @LABRCNTIP (procalcitonin:4,lacticidven:4)  ) Recent Results (from the past 240 hour(s))  Urine culture     Status: None (Preliminary result)   Collection Time: 01/01/17  4:28 AM  Result Value Ref Range Status   Specimen Description URINE, CLEAN CATCH  Final   Special Requests NONE  Final   Culture   Final    CULTURE REINCUBATED FOR BETTER GROWTH Performed at [facility], [place] [address], [place], [place] [address]    Report Status PENDING  Incomplete  Urine culture     Status: None   Collection Time: 01/01/17  8:37 AM  Result Value Ref Range Status   Specimen Description URINE, CATHETERIZED URINE CYSTOSCOPE  Final   Special Requests NONE  Final   Culture   Final    NO GROWTH Performed at [facility], [address] [place], [place] [address]    Report Status 01/02/2017 FINAL  Final      Radiology Studies: Ct Renal Stone Study  Result Date: 01/01/2017 CLINICAL DATA:  Intermittent right flank pain for 2 days. Previous history of  kidney stones. EXAM: CT ABDOMEN AND PELVIS WITHOUT CONTRAST TECHNIQUE: Multidetector CT imaging of the abdomen and pelvis was performed following the standard protocol without IV contrast. COMPARISON:  10/21/2015 FINDINGS: Lower chest: The lung bases are clear. Postoperative median sternotomy. Small esophageal hiatal hernia. Hepatobiliary: Scattered calcified granulomas throughout the liver. subcentimeter cyst in segment 4 of the liver. Surgical absence of the gallbladder. No bile duct dilatation. Pancreas: Unremarkable. No pancreatic ductal dilatation or surrounding inflammatory changes. Spleen: Calcified granulomas in the spleen.  Normal size. Adrenals/Urinary Tract: No adrenal gland nodules. 5 mm stone in the mid/distal right ureter at the level of the sacrum. Proximal hydronephrosis and hydroureter. Prominent stranding around the right kidney and ureter. Additional intrarenal stones bilaterally. Largest on the left measures 7.6 mm diameter. No hydronephrosis or hydroureter on the left. Bladder wall is not thickened. Stomach/Bowel: Stomach, small bowel, and colon are mostly decompressed. Small duodenal diverticulum. Scattered stool throughout the colon. No bowel wall thickening or infiltration. Scattered colonic diverticula. Appendix is surgically absent. Vascular/Lymphatic: Aortic atherosclerosis. No enlarged abdominal or pelvic lymph nodes. Reproductive: Prostate is unremarkable. Other: No abdominal wall hernia or abnormality. No abdominopelvic ascites. Musculoskeletal: Degenerative changes in the spine. No destructive bone lesions. IMPRESSION: 5 mm stone in the mid/distal right ureter with moderate proximal obstruction. Additional nonobstructing intrarenal stones bilaterally. Aortic atherosclerosis. Electronically Signed   By: Lucienne Capers M.D.   On: 01/01/2017 05:55     Scheduled Meds: . aspirin EC  81 mg Oral Daily  . cefTRIAXone (ROCEPHIN)  IV  1 g Intravenous Q24H  . insulin aspart  0-20 Units  Subcutaneous TID WC  . insulin aspart  0-5 Units Subcutaneous QHS  . insulin glargine  58 Units Subcutaneous QHS  . pantoprazole  40 mg Oral Daily  . polyethylene glycol  17 g Oral Daily  . rivaroxaban  20 mg Oral Q supper  . [START ON 01/03/2017] rosuvastatin  5 mg Oral Q M,W,F  . sotalol  120 mg Oral BID  . tamsulosin  0.4 mg Oral Daily   Continuous Infusions:   LOS: 1 day   Time Spent in minutes   30 minutes  Hashir Deleeuw D.O. on 01/02/2017 at 10:48 AM  Between 7am to 7pm - Pager - [phone]  After 7pm go to [email or web address] - password [email or web address]  And look for the night coverage person covering  for me after hours  [facility] Office  [phone]

## 2017-01-03 ENCOUNTER — Encounter (HOSPITAL_COMMUNITY): Payer: BLUE CROSS/BLUE SHIELD

## 2017-01-03 ENCOUNTER — Encounter (HOSPITAL_COMMUNITY): Payer: Self-pay | Admitting: Urology

## 2017-01-03 ENCOUNTER — Other Ambulatory Visit: Payer: Self-pay | Admitting: Urology

## 2017-01-03 LAB — BASIC METABOLIC PANEL
ANION GAP: 7 (ref 5–15)
BUN: 19 mg/dL (ref 6–20)
CALCIUM: 9.3 mg/dL (ref 8.9–10.3)
CO2: 29 mmol/L (ref 22–32)
Chloride: 102 mmol/L (ref 101–111)
Creatinine, Ser: 1.08 mg/dL (ref 0.61–1.24)
Glucose, Bld: 119 mg/dL — ABNORMAL HIGH (ref 65–99)
Potassium: 4.3 mmol/L (ref 3.5–5.1)
SODIUM: 138 mmol/L (ref 135–145)

## 2017-01-03 LAB — GLUCOSE, CAPILLARY: GLUCOSE-CAPILLARY: 136 mg/dL — AB (ref 65–99)

## 2017-01-03 LAB — CBC
HEMATOCRIT: 37.5 % — AB (ref 39.0–52.0)
Hemoglobin: 11.9 g/dL — ABNORMAL LOW (ref 13.0–17.0)
MCH: 25.5 pg — ABNORMAL LOW (ref 26.0–34.0)
MCHC: 31.7 g/dL (ref 30.0–36.0)
MCV: 80.5 fL (ref 78.0–100.0)
Platelets: 446 10*3/uL — ABNORMAL HIGH (ref 150–400)
RBC: 4.66 MIL/uL (ref 4.22–5.81)
RDW: 16.1 % — AB (ref 11.5–15.5)
WBC: 10.2 10*3/uL (ref 4.0–10.5)

## 2017-01-03 MED ORDER — CEFDINIR 300 MG PO CAPS: 300.0000 mg | ORAL_CAPSULE | Freq: Two times a day (BID) | ORAL | 0 refills | Status: DC

## 2017-01-03 NOTE — Discharge Instructions (Signed)
Ureteral Stent Implantation, Care After °Introduction °Refer to this sheet in the next few weeks. These instructions provide you with information about caring for yourself after your procedure. Your health care provider may also give you more specific instructions. Your treatment has been planned according to current medical practices, but problems sometimes occur. Call your health care provider if you have any problems or questions after your procedure. °What can I expect after the procedure? °After the procedure, it is common to have: °· Nausea. °· Mild pain when you urinate. You may feel this pain in your lower back or lower abdomen. Pain should stop within a few minutes after you urinate. This may last for up to 1 week. °· A small amount of blood in your urine for several days. °Follow these instructions at home: °  °Medicines °· Take over-the-counter and prescription medicines only as told by your health care provider. °· If you were prescribed an antibiotic medicine, take it as told by your health care provider. Do not stop taking the antibiotic even if you start to feel better. °· Do not drive for 24 hours if you received a sedative. °· Do not drive or operate heavy machinery while taking prescription pain medicines. °Activity °· Return to your normal activities as told by your health care provider. Ask your health care provider what activities are safe for you. °· Do not lift anything that is heavier than 10 lb (4.5 kg). Follow this limit for 1 week after your procedure, or for as long as told by your health care provider. °General instructions °· Watch for any blood in your urine. Call your health care provider if the amount of blood in your urine increases. °· If you have a catheter: °¨ Follow instructions from your health care provider about taking care of your catheter and collection bag. °¨ Do not take baths, swim, or use a hot tub until your health care provider approves. °· Drink enough fluid to keep  your urine clear or pale yellow. °· Keep all follow-up visits as told by your health care provider. This is important. °Contact a health care provider if: °· You have pain that gets worse or does not get better with medicine, especially pain when you urinate. °· You have difficulty urinating. °· You feel nauseous or you vomit repeatedly during a period of more than 2 days after the procedure. °Get help right away if: °· Your urine is dark red or has blood clots in it. °· You are leaking urine (have incontinence). °· The end of the stent comes out of your urethra. °· You cannot urinate. °· You have sudden, sharp, or severe pain in your abdomen or lower back. °· You have a fever. °This information is not intended to replace advice given to you by your health care provider. Make sure you discuss any questions you have with your health care provider. °Document Released: 07/11/2013 Document Revised: 04/15/2016 Document Reviewed: 05/23/2015 °© 2017 [employer] ° °

## 2017-01-03 NOTE — Discharge Summary (Signed)
Physician Discharge Summary  Charles [name] [id number] DOB: Nov 22, 1946 DOA: 01/01/2017  PCP: No PCP Per Patient  Admit date: 01/01/2017 Discharge date: 01/03/2017  Time spent: 45 minutes  Recommendations for Outpatient Follow-up:  Patient will be discharged to home.  Patient will need to follow up with primary care provider within one week of discharge. Follow up with Dr. Jeffie [name], urology, as specified.  Patient should continue medications as prescribed.  Patient should follow a heart healthy/carb modified diet.   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 Martie Round Carlyon Prows [name] a 71 y.o.malewith medical history significant of CAD status post CABG in 2017, PAF (on Xarelto), type 2 diabetes mellitus, hypertension, OSA (on CPAP). Patient presented to the ED complaining of acute severe right flank pain that started 2 days ago, pain has been worsening despite antibioticsand tramadoltreatment. Associated symptoms include hematuria and nausea. Patient was recently hospitalized (1/31)at [facility] due to Enterobacter bacteremia from urinary source. Completed course of ceftriaxone on 2/6. Denies chest pain, SOB, dizziness, vomiting, diarrhea and constipation.   During previous hospitalization patient developed QTcprolongation after started on Cipro, as well with Bactrim.  Hospital Course:  Obstructing right ureteral stone -Urology consultation  appreciated -Status post right stent placement -Placed on ceftriaxone and flomax -Patient to follow with Dr. Jeffie [name].   Recent history of bacteremia secondary to urinary tract infection -Patient recently admitted at outside facility and was given medications, ciprofloxacin, Bactrim -Urine culture and blood cultures pending -Patient currently afebrile. Leukocytosis resolved -Initially placed ceftriaxone, will discharge with omnicef  Chronic kidney disease, stage III -Creatinine appears to be at baseline, continue to monitor BMP -Creatinine currently 1.08  Diabetes mellitus, type II -Hemoglobin A1c in October 2017 6.7 -Metformin currently held- resume at discharge -Continue home meds on discahrge: ISS  Essential hypertension -Continue sotalol -Hydralazine PRN  Atrial fibrillation -Continue sotalol -Continue Xarelto  Chronic systolic/diastolic CHF -Currently appears to be euvolemic and compensated -Echocardiogram 08/03/2016 showed EF of 45 and [id number], grade 1 diastolic dysfunction -Monitor intake and output, daily weights -Lasix currently held  CAD status post CABG -Currently stable, no complaints of chest pain -Continue aspirin, statin  QTc prolongation -Currently stable  Chronic normocytic anemia -Hemoglobin currently 10.2, appears to be at baseline -Continue to monitor CBC  Consultants Urology  Procedures  Cystoscopy, right retrograde ureteropyelogram with fluoroscopic interpretation, placement of 6 French by 24 centimeter contour double-J stent without tether  Discharge Exam: Vitals:   01/02/17 2109 01/03/17 0527  BP: 133/84 134/88  Pulse: 70 75  Resp: 19 19  Temp: 98.6 F (37 C) 97.7 F (36.5 C)   Patient states he is feeling better. Complains of mild pain with urination. Denies chest pain, shortness of breath, abdominal pain, nausea or vomiting, diarrhea or constipation. Exam  General: Well developed, well nourished, NAD, appears stated  age  HEENT: NCAT, mucous membranes moist.   Cardiovascular: S1 S2 auscultated, no rubs, murmurs or gallops. Regular rate and rhythm.  Respiratory: Clear to auscultation bilaterally with equal chest rise  Abdomen: Soft,  obese, nontender, nondistended, + bowel sounds  Extremities: warm dry without cyanosis clubbing or edema  Neuro: AAOx3, nonfocal  Psych: Normal affect and demeanor  Discharge Instructions Discharge Instructions    Discharge instructions    Complete by:  As directed    Patient will be discharged to home.  Patient will need to follow up with primary care provider within one week of discharge. Follow up with Dr. Jeffie [name], urology, as specified.  Patient should continue medications as prescribed.  Patient should follow a heart healthy/carb modified diet.     Current Discharge Medication List    START taking these medications   Details  cefdinir (OMNICEF) 300 MG capsule Take 1 capsule (300 mg total) by mouth 2 (two) times daily. Qty: 14 capsule, Refills: 0      CONTINUE these medications which have NOT CHANGED   Details  alfuzosin (UROXATRAL) 10 MG 24 hr tablet Take 10 mg by mouth daily with breakfast.    aspirin EC 81 MG EC tablet Take 1 tablet (81 mg total) by mouth daily.    furosemide (LASIX) 40 MG tablet Take 1 tablet (40 mg total) by mouth daily. Daily for one week - then changing to just as needed - prn weight gain/swelling Qty: 30 tablet, Refills: 1    glucose blood test strip Check blood sugar three times a day. Qty: 100 each, Refills: 12   Associated Diagnoses: Diabetes mellitus type 2 in obese (HCC)    insulin aspart (NOVOLOG) 100 UNIT/ML injection Inject 5-15 units in the skin before meals Qty: 10 mL, Refills: 11    insulin degludec (TRESIBA) 100 UNIT/ML SOPN FlexTouch Pen Inject 0.58 mLs (58 Units total) into the skin daily. Qty: 15 mL, Refills: 0    Insulin Syringe-Needle U-100 (B-D INS SYRINGE 0.5CC/30GX1/2") 30G X 1/2" 0.5 ML MISC Use to inject  insulin 3 times daily Qty: 100 each, Refills: 2    lisinopril (PRINIVIL,ZESTRIL) 5 MG tablet Take 1 tablet (5 mg total) by mouth daily. Qty: 30 tablet, Refills: 3    metFORMIN (GLUCOPHAGE) 500 MG tablet Take 500 mg by mouth 3 (three) times daily. Take 3 tablets by mouth daily     pantoprazole (PROTONIX) 40 MG tablet Take 1 tablet (40 mg total) by mouth daily. Qty: 30 tablet, Refills: 11    rivaroxaban (XARELTO) 20 MG TABS tablet Take 1 tablet (20 mg total) by mouth daily with supper. Qty: 30 tablet, Refills: 11   Associated Diagnoses: Paroxysmal atrial fibrillation (HCC)    rosuvastatin (CRESTOR) 5 MG tablet Take 1 tablet (5 mg total) by mouth every Monday, Wednesday, and Friday. Qty: 30 tablet, Refills: 1    sotalol (BETAPACE) 120 MG tablet Take 1 tablet (120 mg total) by mouth 2 (two) times daily. Qty: 60 tablet, Refills: 10    VENTOLIN HFA 108 (90 Base) MCG/ACT inhaler Inhale 2 puffs into the lungs 2 (two) times daily as needed for wheezing or shortness of breath.  Refills: 0      STOP taking these medications     oxyCODONE (OXY IR/ROXICODONE) 5 MG immediate release tablet        Allergies  Allergen Reactions  . Chocolate Hives, Shortness Of Breath and Swelling  . Statins Other (See Comments)    Mental changes, muscle aches  . Black Pepper [[place]] Hives  . Codeine Itching  . Oxytetracycline Other (See Comments)    Flushing in sunlight  . Tape Rash and Other (See Comments)    SKIN IS VERY SENSITIVE!!  Follow-up Information    Malka So, MD Follow up.   Specialty:  Urology Why:  My office will call to arrange your next procedure.  Contact information: [place] [address] [phone]        Primary care physician. Schedule an appointment as soon as possible for a visit in 1 week(s).   Why:  Hospital follow up           The results of significant diagnostics from this hospitalization (including imaging, microbiology, ancillary and  laboratory) are listed below for reference.    Significant Diagnostic Studies: Dg Chest 2 View  Result Date: 12/13/2016 CLINICAL DATA:  History of CABG in October 2017. The patient reports chest soreness. History of asthma EXAM: CHEST  2 VIEW COMPARISON:  PA and lateral chest x-ray of October 28, 2016 FINDINGS: The lungs are adequately inflated. There is no focal infiltrate. The interstitial markings are mildly prominent but less conspicuous than on the previous study. The heart is top-normal in size. The pulmonary vascularity is mildly prominent centrally. The patient has undergone previous CABG. No residual pleural effusion is demonstrated. The permanent pacemaker is in stable position. IMPRESSION: There is no acute cardiopulmonary abnormality. Chronic post CABG changes. Electronically Signed   By: David  Martinique M.D.   On: 12/13/2016 13:06   Ct Renal Stone Study  Result Date: 01/01/2017 CLINICAL DATA:  Intermittent right flank pain for 2 days. Previous history of kidney stones. EXAM: CT ABDOMEN AND PELVIS WITHOUT CONTRAST TECHNIQUE: Multidetector CT imaging of the abdomen and pelvis was performed following the standard protocol without IV contrast. COMPARISON:  10/21/2015 FINDINGS: Lower chest: The lung bases are clear. Postoperative median sternotomy. Small esophageal hiatal hernia. Hepatobiliary: Scattered calcified granulomas throughout the liver. subcentimeter cyst in segment 4 of the liver. Surgical absence of the gallbladder. No bile duct dilatation. Pancreas: Unremarkable. No pancreatic ductal dilatation or surrounding inflammatory changes. Spleen: Calcified granulomas in the spleen.  Normal size. Adrenals/Urinary Tract: No adrenal gland nodules. 5 mm stone in the mid/distal right ureter at the level of the sacrum. Proximal hydronephrosis and hydroureter. Prominent stranding around the right kidney and ureter. Additional intrarenal stones bilaterally. Largest on the left measures 7.6 mm diameter.  No hydronephrosis or hydroureter on the left. Bladder wall is not thickened. Stomach/Bowel: Stomach, small bowel, and colon are mostly decompressed. Small duodenal diverticulum. Scattered stool throughout the colon. No bowel wall thickening or infiltration. Scattered colonic diverticula. Appendix is surgically absent. Vascular/Lymphatic: Aortic atherosclerosis. No enlarged abdominal or pelvic lymph nodes. Reproductive: Prostate is unremarkable. Other: No abdominal wall hernia or abnormality. No abdominopelvic ascites. Musculoskeletal: Degenerative changes in the spine. No destructive bone lesions. IMPRESSION: 5 mm stone in the mid/distal right ureter with moderate proximal obstruction. Additional nonobstructing intrarenal stones bilaterally. Aortic atherosclerosis. Electronically Signed   By: Lucienne Capers M.D.   On: 01/01/2017 05:55    Microbiology: Recent Results (from the past 240 hour(s))  Urine culture     Status: Abnormal   Collection Time: 01/01/17  4:28 AM  Result Value Ref Range Status   Specimen Description URINE, CLEAN CATCH  Final   Special Requests NONE  Final   Culture (A)  Final    <10,000 COLONIES/mL INSIGNIFICANT GROWTH Performed at [facility], [address], [place], [place] [address]    Report Status 01/02/2017 FINAL  Final  Blood culture (routine x 2)     Status: None (Preliminary result)   Collection Time: 01/01/17  6:25 AM  Result Value Ref Range Status   Specimen Description BLOOD LEFT HAND  Final   Special Requests BOTTLES DRAWN AEROBIC AND ANAEROBIC 6ML  Final   Culture   Final    NO GROWTH 1 DAY Performed at [facility], [place] [address] [place], [place] [address]    Report Status PENDING  Incomplete  Blood culture (routine x 2)     Status: None (Preliminary result)   Collection Time: 01/01/17  6:30 AM  Result Value Ref Range Status   Specimen Description BLOOD RIGHT HAND  Final   Special Requests BOTTLES DRAWN AEROBIC AND ANAEROBIC 6ML  Final     Culture   Final    NO GROWTH 1 DAY Performed at [facility], [place] [address] [place], [place] [address]    Report Status PENDING  Incomplete  Urine culture     Status: None   Collection Time: 01/01/17  8:37 AM  Result Value Ref Range Status   Specimen Description URINE, CATHETERIZED URINE CYSTOSCOPE  Final   Special Requests NONE  Final   Culture   Final    NO GROWTH Performed at [facility], [address] [place], [place] [address]    Report Status 01/02/2017 FINAL  Final     Labs: Basic Metabolic Panel:  Recent Labs Lab 01/01/17 0515 01/02/17 0457 01/03/17 0503  NA 139 136 138  K 4.7 4.7 4.3  CL 105 103 102  CO2  --  26 29  GLUCOSE 158* 174* 119*  BUN 18 22* 19  CREATININE 1.30* 1.40* 1.08  CALCIUM  --  8.5* 9.3   Liver Function Tests:  Recent Labs Lab 01/02/17 0457  AST 14*  ALT 16*  ALKPHOS 107  BILITOT 0.1*  PROT 7.1  ALBUMIN 3.0*   No results for input(s): LIPASE, AMYLASE in the last 168 hours. No results for input(s): AMMONIA in the last 168 hours. CBC:  Recent Labs Lab 01/01/17 0500 01/01/17 0515 01/02/17 0457 01/03/17 0503  WBC 13.9*  --  9.6 10.2  NEUTROABS 11.4*  --   --   --   HGB 10.6* 11.9* 10.2* 11.9*  HCT 34.0* 35.0* 33.0* 37.5*  MCV 79.8  --  81.3 80.5  PLT 454*  --  385 446*   Cardiac Enzymes: No results for input(s): CKTOTAL, CKMB, CKMBINDEX, TROPONINI in the last 168 hours. BNP: BNP (last 3 results)  Recent Labs  08/02/16 2315  BNP 188.5*    ProBNP (last 3 results) No results for input(s): PROBNP in the last 8760 hours.  CBG:  Recent Labs Lab 01/02/17 0727 01/02/17 1218 01/02/17 1704 01/02/17 2119 01/03/17 0736  GLUCAP 155* 259* 148* 144* 136*       Signed:  Athanasius Kesling  [facility] 01/03/2017, 10:13 AM

## 2017-01-03 NOTE — Progress Notes (Signed)
2 Days Post-Op   Assessment and Plan: 1. Right ureteral stone with pyonephrosis now doing well post stenting.    I am going to get him scheduled for a ureteroscopic stone extraction within the next 7-10 days.   D/C per medical service.   Subjective: Charles [name] is doing better post stenting for right ureteral stone with pyonephrosis on Saturday.  He has some stent irritation but no fever.   ROS:  Review of Systems  Constitutional: Negative for chills and fever.  Genitourinary: Positive for frequency and urgency.    Anti-infectives: Anti-infectives    Start     Dose/Rate Route Frequency Ordered Stop   01/02/17 0600  cefTRIAXone (ROCEPHIN) 1 g in dextrose 5 % 50 mL IVPB  Status:  Discontinued     1 g 100 mL/hr over 30 Minutes Intravenous Every 24 hours 01/01/17 0857 01/01/17 1018   01/02/17 0600  cefTRIAXone (ROCEPHIN) 1 g in dextrose 5 % 50 mL IVPB     1 g 100 mL/hr over 30 Minutes Intravenous Every 24 hours 01/01/17 1018     01/01/17 0515  cefTRIAXone (ROCEPHIN) 1 g in dextrose 5 % 50 mL IVPB     1 g 100 mL/hr over 30 Minutes Intravenous  Once 01/01/17 0505 01/01/17 0654      Current Facility-Administered Medications  Medication Dose Route Frequency Provider Last Rate Last Dose  . acetaminophen (TYLENOL) tablet 650 mg  650 mg Oral Q6H PRN Doreatha Lew, MD   650 mg at 01/02/17 0950   Or  . acetaminophen (TYLENOL) suppository 650 mg  650 mg Rectal Q6H PRN Doreatha Lew, MD      . aspirin EC tablet 81 mg  81 mg Oral Daily Doreatha Lew, MD   81 mg at 01/02/17 0944  . cefTRIAXone (ROCEPHIN) 1 g in dextrose 5 % 50 mL IVPB  1 g Intravenous Q24H Doreatha Lew, MD   1 g at 01/03/17 0604  . hydrALAZINE (APRESOLINE) injection 10 mg  10 mg Intravenous Q6H PRN Maryann Mikhail, DO      . insulin aspart (novoLOG) injection 0-20 Units  0-20 Units Subcutaneous TID WC Doreatha Lew, MD   3 Units at 01/02/17 1736  . insulin aspart (novoLOG) injection 0-5 Units  0-5 Units  Subcutaneous QHS Doreatha Lew, MD      . insulin glargine (LANTUS) injection 58 Units  58 Units Subcutaneous QHS Doreatha Lew, MD   58 Units at 01/02/17 2131  . ketorolac (TORADOL) 15 MG/ML injection 15 mg  15 mg Intravenous Q8H PRN Doreatha Lew, MD      . pantoprazole (PROTONIX) EC tablet 40 mg  40 mg Oral Daily Doreatha Lew, MD   40 mg at 01/02/17 0944  . polyethylene glycol (MIRALAX / GLYCOLAX) packet 17 g  17 g Oral Daily Maryann Mikhail, DO   17 g at 01/02/17 0944  . rivaroxaban (XARELTO) tablet 20 mg  20 mg Oral Q supper Randa Spike, RPH   20 mg at 01/02/17 1735  . rosuvastatin (CRESTOR) tablet 5 mg  5 mg Oral Q M,W,F Doreatha Lew, MD      . senna-docusate (Senokot-S) tablet 1 tablet  1 tablet Oral QHS PRN Doreatha Lew, MD      . sotalol (BETAPACE) tablet 120 mg  120 mg Oral BID Doreatha Lew, MD   120 mg at 01/02/17 2131  . tamsulosin (FLOMAX) capsule 0.4 mg  0.4 mg Oral Daily  April Palumbo, MD   0.4 mg at 01/02/17 0944     Objective: Vital signs in last 24 hours: Temp:  [97.7 F (36.5 C)-98.6 F (37 C)] 97.7 F (36.5 C) (02/12 0527) Pulse Rate:  [70-75] 75 (02/12 0527) Resp:  [19-20] 19 (02/12 0527) BP: (133-164)/(84-91) 134/88 (02/12 0527) SpO2:  [100 %] 100 % (02/12 0527)  Intake/Output from previous day: 02/11 0701 - 02/12 0700 In: 1200 [P.O.:1200] Out: 3850 [Urine:3850] Intake/Output this shift: Total I/O In: -  Out: 125 [Urine:125]   Physical Exam  Constitutional: He is well-developed, well-nourished, and in no distress.  Vitals reviewed.   Lab Results:   Recent Labs  01/02/17 0457 01/03/17 0503  WBC 9.6 10.2  HGB 10.2* 11.9*  HCT 33.0* 37.5*  PLT 385 446*   BMET  Recent Labs  01/02/17 0457 01/03/17 0503  NA 136 138  K 4.7 4.3  CL 103 102  CO2 26 29  GLUCOSE 174* 119*  BUN 22* 19  CREATININE 1.40* 1.08  CALCIUM 8.5* 9.3   PT/INR No results for input(s): LABPROT, INR in the last 72  hours. ABG No results for input(s): PHART, HCO3 in the last 72 hours.  Invalid input(s): PCO2, PO2  Studies/Results: No results found.  Urine cultures no or insignificant growth.  BCx pending.   CT films reviewed.         LOS: 2 days    Charles [name] 01/03/2017 [id number] ID: Charles [name], male   DOB: [birth year], 71 y.o.   MRN: [id number]

## 2017-01-04 ENCOUNTER — Telehealth (HOSPITAL_COMMUNITY): Payer: Self-pay | Admitting: Cardiovascular Disease

## 2017-01-04 ENCOUNTER — Ambulatory Visit (INDEPENDENT_AMBULATORY_CARE_PROVIDER_SITE_OTHER): Payer: BLUE CROSS/BLUE SHIELD | Admitting: Endocrinology

## 2017-01-04 ENCOUNTER — Encounter (HOSPITAL_COMMUNITY): Payer: Self-pay | Admitting: *Deleted

## 2017-01-04 ENCOUNTER — Encounter: Payer: Self-pay | Admitting: Endocrinology

## 2017-01-04 VITALS — BP 136/80 | HR 72 | Ht 69.0 in | Wt 275.0 lb

## 2017-01-04 DIAGNOSIS — E782 Mixed hyperlipidemia: Secondary | ICD-10-CM

## 2017-01-04 DIAGNOSIS — Z794 Long term (current) use of insulin: Secondary | ICD-10-CM

## 2017-01-04 DIAGNOSIS — E1142 Type 2 diabetes mellitus with diabetic polyneuropathy: Secondary | ICD-10-CM | POA: Diagnosis not present

## 2017-01-04 DIAGNOSIS — E1165 Type 2 diabetes mellitus with hyperglycemia: Secondary | ICD-10-CM

## 2017-01-04 MED ORDER — METFORMIN HCL ER 500 MG PO TB24: 1500.0000 mg | ORAL_TABLET | Freq: Every day | ORAL | 3 refills | Status: DC

## 2017-01-04 MED ORDER — GABAPENTIN 300 MG PO CAPS: 300.0000 mg | ORAL_CAPSULE | Freq: Three times a day (TID) | ORAL | 3 refills | Status: DC

## 2017-01-04 NOTE — Patient Instructions (Signed)
Supper dose 18 Humalog and go up or down 2-4 units based on meal size
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
## 2017-01-05 ENCOUNTER — Encounter (HOSPITAL_COMMUNITY): Payer: BLUE CROSS/BLUE SHIELD

## 2017-01-05 ENCOUNTER — Telehealth: Payer: Self-pay | Admitting: Endocrinology

## 2017-01-05 LAB — CUP PACEART INCLINIC DEVICE CHECK
Battery Impedance: 3300 Ohm
Battery Voltage: 2.81 V
Implantable Lead Implant Date: 20091007
Implantable Lead Implant Date: 20091007
Implantable Lead Location: 753860
Implantable Pulse Generator Implant Date: 20091007
Lead Channel Impedance Value: 402 Ohm
Lead Channel Impedance Value: 551 Ohm
Lead Channel Pacing Threshold Amplitude: 0.75 V
Lead Channel Pacing Threshold Amplitude: 1 V
Lead Channel Pacing Threshold Pulse Width: 0.4 ms
Lead Channel Sensing Intrinsic Amplitude: 1 mV
Lead Channel Setting Pacing Amplitude: 2 V
Lead Channel Setting Pacing Amplitude: 2.5 V
MDC IDC LEAD LOCATION: 753859
MDC IDC MSMT LEADCHNL RA PACING THRESHOLD PULSEWIDTH: 0.4 ms
MDC IDC MSMT LEADCHNL RV SENSING INTR AMPL: 12 mV — AB
MDC IDC PG SERIAL: 1266370
MDC IDC SESS DTM: 20180209165418
MDC IDC SET LEADCHNL RV PACING PULSEWIDTH: 0.4 ms
MDC IDC SET LEADCHNL RV SENSING SENSITIVITY: 2 mV
MDC IDC STAT BRADY RA PERCENT PACED: 76 %
MDC IDC STAT BRADY RV PERCENT PACED: 1 % — AB

## 2017-01-05 MED ORDER — DEXTROSE 5 % IV SOLN
3.0000 g | INTRAVENOUS | Status: AC
  Administered 2017-01-06: 3 g via INTRAVENOUS
  Filled 2017-01-05: qty 3

## 2017-01-05 NOTE — Telephone Encounter (Signed)
Patient ask if he could a new prescription for the contour next meter and the strips.    Send to    [facility], [place] [address] [place] [phone] (Phone) [phone] (Fax)

## 2017-01-05 NOTE — Anesthesia Preprocedure Evaluation (Addendum)
Anesthesia Evaluation  Patient identified by MRN, date of birth, ID band Patient awake    Reviewed: Allergy & Precautions, H&P , NPO status , Patient's Chart, lab work & pertinent test results, reviewed documented beta blocker date and time   Airway Mallampati: III  TM Distance: >3 FB Neck ROM: Full    Dental no notable dental hx. (+) Teeth Intact, Dental Advisory Given   Pulmonary asthma , sleep apnea and Continuous Positive Airway Pressure Ventilation , former smoker,    Pulmonary exam normal breath sounds clear to auscultation       Cardiovascular Exercise Tolerance: Good hypertension, Pt. on medications and On Home Beta Blockers + CAD, + Past MI, + Cardiac Stents and + Peripheral Vascular Disease  + dysrhythmias + pacemaker  Rhythm:Regular Rate:Normal     Neuro/Psych negative neurological ROS  negative psych ROS   GI/Hepatic Neg liver ROS, GERD  Medicated and Controlled,  Endo/Other  diabetes, Insulin DependentMorbid obesity  Renal/GU negative Renal ROS  negative genitourinary   Musculoskeletal  (+) Arthritis , Osteoarthritis,    Abdominal   Peds  Hematology negative hematology ROS (+)   Anesthesia Other Findings   Reproductive/Obstetrics negative OB ROS                           Anesthesia Physical Anesthesia Plan  ASA: III  Anesthesia Plan: General   Post-op Pain Management:    Induction: Intravenous  Airway Management Planned: LMA  Additional Equipment:   Intra-op Plan:   Post-operative Plan: Extubation in OR  Informed Consent: I have reviewed the patients History and Physical, chart, labs and discussed the procedure including the risks, benefits and alternatives for the proposed anesthesia with the patient or authorized representative who has indicated his/her understanding and acceptance.   Dental advisory given  Plan Discussed with: CRNA  Anesthesia Plan Comments:          Anesthesia Quick Evaluation

## 2017-01-06 ENCOUNTER — Ambulatory Visit (HOSPITAL_COMMUNITY): Payer: BLUE CROSS/BLUE SHIELD | Admitting: Anesthesiology

## 2017-01-06 ENCOUNTER — Ambulatory Visit (HOSPITAL_COMMUNITY)
Admission: RE | Admit: 2017-01-06 | Discharge: 2017-01-06 | Disposition: A | Discharge status: Home / Self Care | Payer: BLUE CROSS/BLUE SHIELD | Source: Ambulatory Visit | Attending: Urology | Admitting: Urology

## 2017-01-06 ENCOUNTER — Encounter (HOSPITAL_COMMUNITY): Payer: Self-pay | Admitting: *Deleted

## 2017-01-06 ENCOUNTER — Encounter (HOSPITAL_COMMUNITY)
Admission: RE | Disposition: A | Discharge status: Home / Self Care | Payer: Self-pay | Source: Ambulatory Visit | Attending: Urology

## 2017-01-06 ENCOUNTER — Other Ambulatory Visit: Payer: Self-pay

## 2017-01-06 DIAGNOSIS — Z951 Presence of aortocoronary bypass graft: Secondary | ICD-10-CM | POA: Diagnosis not present

## 2017-01-06 DIAGNOSIS — I129 Hypertensive chronic kidney disease with stage 1 through stage 4 chronic kidney disease, or unspecified chronic kidney disease: Secondary | ICD-10-CM | POA: Diagnosis not present

## 2017-01-06 DIAGNOSIS — G4733 Obstructive sleep apnea (adult) (pediatric): Secondary | ICD-10-CM | POA: Diagnosis not present

## 2017-01-06 DIAGNOSIS — Z885 Allergy status to narcotic agent status: Secondary | ICD-10-CM | POA: Insufficient documentation

## 2017-01-06 DIAGNOSIS — I872 Venous insufficiency (chronic) (peripheral): Secondary | ICD-10-CM | POA: Diagnosis not present

## 2017-01-06 DIAGNOSIS — I480 Paroxysmal atrial fibrillation: Secondary | ICD-10-CM | POA: Insufficient documentation

## 2017-01-06 DIAGNOSIS — J45909 Unspecified asthma, uncomplicated: Secondary | ICD-10-CM | POA: Insufficient documentation

## 2017-01-06 DIAGNOSIS — M170 Bilateral primary osteoarthritis of knee: Secondary | ICD-10-CM | POA: Diagnosis not present

## 2017-01-06 DIAGNOSIS — Z6841 Body Mass Index (BMI) 40.0 and over, adult: Secondary | ICD-10-CM | POA: Insufficient documentation

## 2017-01-06 DIAGNOSIS — Z881 Allergy status to other antibiotic agents status: Secondary | ICD-10-CM | POA: Diagnosis not present

## 2017-01-06 DIAGNOSIS — Z950 Presence of cardiac pacemaker: Secondary | ICD-10-CM | POA: Diagnosis not present

## 2017-01-06 DIAGNOSIS — Z7901 Long term (current) use of anticoagulants: Secondary | ICD-10-CM | POA: Insufficient documentation

## 2017-01-06 DIAGNOSIS — N132 Hydronephrosis with renal and ureteral calculous obstruction: Secondary | ICD-10-CM | POA: Insufficient documentation

## 2017-01-06 DIAGNOSIS — M19042 Primary osteoarthritis, left hand: Secondary | ICD-10-CM | POA: Insufficient documentation

## 2017-01-06 DIAGNOSIS — I252 Old myocardial infarction: Secondary | ICD-10-CM | POA: Diagnosis not present

## 2017-01-06 DIAGNOSIS — Z794 Long term (current) use of insulin: Secondary | ICD-10-CM | POA: Insufficient documentation

## 2017-01-06 DIAGNOSIS — E119 Type 2 diabetes mellitus without complications: Secondary | ICD-10-CM | POA: Insufficient documentation

## 2017-01-06 DIAGNOSIS — Z9889 Other specified postprocedural states: Secondary | ICD-10-CM | POA: Diagnosis not present

## 2017-01-06 DIAGNOSIS — I2510 Atherosclerotic heart disease of native coronary artery without angina pectoris: Secondary | ICD-10-CM | POA: Insufficient documentation

## 2017-01-06 DIAGNOSIS — M19041 Primary osteoarthritis, right hand: Secondary | ICD-10-CM | POA: Diagnosis not present

## 2017-01-06 DIAGNOSIS — Z91018 Allergy to other foods: Secondary | ICD-10-CM | POA: Insufficient documentation

## 2017-01-06 DIAGNOSIS — N183 Chronic kidney disease, stage 3 (moderate): Secondary | ICD-10-CM | POA: Diagnosis not present

## 2017-01-06 DIAGNOSIS — E785 Hyperlipidemia, unspecified: Secondary | ICD-10-CM | POA: Diagnosis not present

## 2017-01-06 DIAGNOSIS — I739 Peripheral vascular disease, unspecified: Secondary | ICD-10-CM | POA: Insufficient documentation

## 2017-01-06 DIAGNOSIS — N201 Calculus of ureter: Secondary | ICD-10-CM | POA: Diagnosis not present

## 2017-01-06 DIAGNOSIS — M47816 Spondylosis without myelopathy or radiculopathy, lumbar region: Secondary | ICD-10-CM | POA: Insufficient documentation

## 2017-01-06 DIAGNOSIS — Z87891 Personal history of nicotine dependence: Secondary | ICD-10-CM | POA: Insufficient documentation

## 2017-01-06 DIAGNOSIS — I447 Left bundle-branch block, unspecified: Secondary | ICD-10-CM | POA: Insufficient documentation

## 2017-01-06 DIAGNOSIS — N202 Calculus of kidney with calculus of ureter: Secondary | ICD-10-CM | POA: Diagnosis not present

## 2017-01-06 DIAGNOSIS — I10 Essential (primary) hypertension: Secondary | ICD-10-CM | POA: Insufficient documentation

## 2017-01-06 DIAGNOSIS — Z888 Allergy status to other drugs, medicaments and biological substances status: Secondary | ICD-10-CM | POA: Insufficient documentation

## 2017-01-06 DIAGNOSIS — Z87442 Personal history of urinary calculi: Secondary | ICD-10-CM | POA: Insufficient documentation

## 2017-01-06 DIAGNOSIS — Z466 Encounter for fitting and adjustment of urinary device: Secondary | ICD-10-CM | POA: Diagnosis not present

## 2017-01-06 HISTORY — PX: URETEROSCOPY WITH HOLMIUM LASER LITHOTRIPSY: SHX6645

## 2017-01-06 HISTORY — DX: Personal history of urinary calculi: Z87.442

## 2017-01-06 HISTORY — DX: Unspecified rotator cuff tear or rupture of unspecified shoulder, not specified as traumatic: M75.100

## 2017-01-06 LAB — CULTURE, BLOOD (ROUTINE X 2)
CULTURE: NO GROWTH
Culture: NO GROWTH

## 2017-01-06 LAB — GLUCOSE, CAPILLARY
Glucose-Capillary: 128 mg/dL — ABNORMAL HIGH (ref 65–99)
Glucose-Capillary: 112 mg/dL — ABNORMAL HIGH (ref 65–99)

## 2017-01-06 SURGERY — URETEROSCOPY, WITH LITHOTRIPSY USING HOLMIUM LASER
Anesthesia: General | Laterality: Right

## 2017-01-06 MED ORDER — LIDOCAINE HCL 2 % EX GEL
CUTANEOUS | Status: AC
  Filled 2017-01-06: qty 5

## 2017-01-06 MED ORDER — FENTANYL CITRATE (PF) 100 MCG/2ML IJ SOLN: 25.0000 ug | INTRAMUSCULAR | Status: DC | PRN

## 2017-01-06 MED ORDER — FENTANYL CITRATE (PF) 100 MCG/2ML IJ SOLN
INTRAMUSCULAR | Status: DC
Start: 2017-01-06 — End: 2017-01-06
  Filled 2017-01-06: qty 2

## 2017-01-06 MED ORDER — ACETAMINOPHEN 650 MG RE SUPP
650.0000 mg | RECTAL | Status: DC | PRN
  Filled 2017-01-06: qty 1

## 2017-01-06 MED ORDER — SODIUM CHLORIDE 0.9 % IV SOLN: 250.0000 mL | INTRAVENOUS | Status: DC | PRN

## 2017-01-06 MED ORDER — 0.9 % SODIUM CHLORIDE (POUR BTL) OPTIME
TOPICAL | Status: DC | PRN
  Administered 2017-01-06: 1000 mL

## 2017-01-06 MED ORDER — PROPOFOL 10 MG/ML IV BOLUS
INTRAVENOUS | Status: DC | PRN
  Administered 2017-01-06: 200 mg via INTRAVENOUS

## 2017-01-06 MED ORDER — ACETAMINOPHEN 325 MG PO TABS
650.0000 mg | ORAL_TABLET | ORAL | Status: DC | PRN
  Administered 2017-01-06: 650 mg via ORAL
  Filled 2017-01-06: qty 2

## 2017-01-06 MED ORDER — BELLADONNA ALKALOIDS-OPIUM 16.2-60 MG RE SUPP
RECTAL | Status: DC | PRN
  Administered 2017-01-06: 1 via RECTAL

## 2017-01-06 MED ORDER — SODIUM CHLORIDE 0.9 % IR SOLN
Status: DC | PRN
  Administered 2017-01-06: 4000 mL

## 2017-01-06 MED ORDER — BELLADONNA ALKALOIDS-OPIUM 16.2-60 MG RE SUPP
RECTAL | Status: AC
  Filled 2017-01-06: qty 1

## 2017-01-06 MED ORDER — LIDOCAINE 2% (20 MG/ML) 5 ML SYRINGE
INTRAMUSCULAR | Status: AC
  Filled 2017-01-06: qty 5

## 2017-01-06 MED ORDER — SODIUM CHLORIDE 0.9% FLUSH: 3.0000 mL | Freq: Two times a day (BID) | INTRAVENOUS | Status: DC

## 2017-01-06 MED ORDER — HYDROMORPHONE HCL 2 MG PO TABS: 2.0000 mg | ORAL_TABLET | Freq: Four times a day (QID) | ORAL | 0 refills | Status: DC | PRN

## 2017-01-06 MED ORDER — LIDOCAINE HCL 2 % EX GEL
CUTANEOUS | Status: DC | PRN
  Administered 2017-01-06: 1 via URETHRAL

## 2017-01-06 MED ORDER — ONDANSETRON HCL 4 MG/2ML IJ SOLN
INTRAMUSCULAR | Status: AC
  Filled 2017-01-06: qty 2

## 2017-01-06 MED ORDER — LIDOCAINE 2% (20 MG/ML) 5 ML SYRINGE
INTRAMUSCULAR | Status: DC | PRN
  Administered 2017-01-06: 100 mg via INTRAVENOUS

## 2017-01-06 MED ORDER — SODIUM CHLORIDE 0.9% FLUSH: 3.0000 mL | INTRAVENOUS | Status: DC | PRN

## 2017-01-06 MED ORDER — LACTATED RINGERS IV SOLN
INTRAVENOUS | Status: DC | PRN
  Administered 2017-01-06: 07:00:00 via INTRAVENOUS

## 2017-01-06 MED ORDER — FENTANYL CITRATE (PF) 100 MCG/2ML IJ SOLN
25.0000 ug | INTRAMUSCULAR | Status: DC | PRN
  Administered 2017-01-06 (×2): 50 ug via INTRAVENOUS

## 2017-01-06 MED ORDER — ONDANSETRON HCL 4 MG/2ML IJ SOLN
INTRAMUSCULAR | Status: DC | PRN
  Administered 2017-01-06: 4 mg via INTRAVENOUS

## 2017-01-06 MED ORDER — PROPOFOL 10 MG/ML IV BOLUS
INTRAVENOUS | Status: AC
  Filled 2017-01-06: qty 20

## 2017-01-06 SURGICAL SUPPLY — 21 items
BAG URO CATCHER STRL LF (MISCELLANEOUS) ×2 IMPLANT
BASKET LASER NITINOL 1.9FR (BASKET) IMPLANT
BASKET STONE NCOMPASS (UROLOGICAL SUPPLIES) IMPLANT
BASKET ZERO TIP NITINOL 2.4FR (BASKET) ×2 IMPLANT
CATH URET 5FR 28IN OPEN ENDED (CATHETERS) IMPLANT
CATH URET DUAL LUMEN 6-10FR 50 (CATHETERS) ×2 IMPLANT
CLOTH BEACON ORANGE TIMEOUT ST (SAFETY) ×2 IMPLANT
FIBER LASER FLEXIVA 1000 (UROLOGICAL SUPPLIES) IMPLANT
FIBER LASER FLEXIVA 365 (UROLOGICAL SUPPLIES) ×2 IMPLANT
FIBER LASER FLEXIVA 550 (UROLOGICAL SUPPLIES) IMPLANT
GLOVE SURG SS PI 8.0 STRL IVOR (GLOVE) IMPLANT
GOWN STRL REUS W/TWL XL LVL3 (GOWN DISPOSABLE) ×2 IMPLANT
GUIDEWIRE STR DUAL SENSOR (WIRE) ×2 IMPLANT
IV NS 1000ML (IV SOLUTION) ×1
IV NS 1000ML BAXH (IV SOLUTION) ×1 IMPLANT
IV NS IRRIG 3000ML ARTHROMATIC (IV SOLUTION) ×2 IMPLANT
MANIFOLD NEPTUNE II (INSTRUMENTS) ×2 IMPLANT
PACK CYSTO (CUSTOM PROCEDURE TRAY) ×2 IMPLANT
SHEATH ACCESS URETERAL 38CM (SHEATH) IMPLANT
SHEATH URET ACCESS 10/12FR (MISCELLANEOUS) IMPLANT
TUBING CONNECTING 10 (TUBING) ×2 IMPLANT

## 2017-01-06 NOTE — Discharge Instructions (Signed)
CYSTOSCOPY HOME CARE INSTRUCTIONS  Activity: Rest for the remainder of the day.  Do not drive or operate equipment today.  You may resume normal activities in one to two days as instructed by your physician.   Meals: Drink plenty of liquids and eat light foods such as gelatin or soup this evening.  You may return to a normal meal plan tomorrow.  Return to Work: You may return to work in one to two days or as instructed by your physician.  Special Instructions / Symptoms: Call your physician if any of these symptoms occur:   -persistent or heavy bleeding  -bleeding which continues after first few urination  -large blood clots that are difficult to pass  -urine stream diminishes or stops completely  -fever equal to or higher than 101 degrees Farenheit.  -cloudy urine with a strong, foul odor  -severe pain  Females should always wipe from front to back after elimination.  You may feel some burning pain when you urinate.  This should disappear with time.  Applying moist heat to the lower abdomen or a hot tub bath may help relieve the pain. \  Bring your stones to the office for analysis.   Patient Signature:  ________________________________________________________  Nurse's Signature:  ________________________________________________________

## 2017-01-06 NOTE — Addendum Note (Signed)
Addendum  created 01/06/17 1039 by Lind Covert, CRNA   Charge Capture section accepted

## 2017-01-06 NOTE — Interval H&P Note (Signed)
History and Physical Interval Note:  He is for ureterscopic stone removal today.  01/06/2017 7:16 AM  Charles [name]  has presented today for surgery, with the diagnosis of RIGHT URETERAL STONE  The various methods of treatment have been discussed with the patient and family. After consideration of risks, benefits and other options for treatment, the patient has consented to  Procedure(s): RIGHT URETEROSCOPY STONE EXTRACTION WITH HOLMIUM LASER AND POSSIBLE STENT EXCHANGE  (Right) as a surgical intervention .  The patient's history has been reviewed, patient examined, no change in status, stable for surgery.  I have reviewed the patient's chart and labs.  Questions were answered to the patient's satisfaction.     Charles [name]

## 2017-01-06 NOTE — Anesthesia Postprocedure Evaluation (Signed)
Anesthesia Post Note  Patient: HAYLEN DANDURAND  Procedure(s) Performed: Procedure(s) (LRB): RIGHT URETEROSCOPY STONE EXTRACTION WITH HOLMIUM LASER AND POSSIBLE STENT EXCHANGE  (Right)  Patient location during evaluation: PACU Anesthesia Type: General Level of consciousness: awake and alert Pain management: pain level controlled Vital Signs Assessment: post-procedure vital signs reviewed and stable Respiratory status: spontaneous breathing, nonlabored ventilation and respiratory function stable Cardiovascular status: blood pressure returned to baseline and stable Postop Assessment: no signs of nausea or vomiting Anesthetic complications: no       Last Vitals:  Vitals:   01/06/17 0845 01/06/17 0900  BP: (!) 151/85   Pulse: 71   Resp: 16   Temp:  36.4 C    Last Pain:  Vitals:   01/06/17 0900  TempSrc:   PainSc: 4                  Charnell Peplinski,W. EDMOND

## 2017-01-06 NOTE — Brief Op Note (Signed)
01/06/2017  7:58 AM  PATIENT:  Charles [name]  71 y.o. male  PRE-OPERATIVE DIAGNOSIS:  RIGHT URETERAL STONE  POST-OPERATIVE DIAGNOSIS:  right ureteral stone  PROCEDURE:  Procedure(s): CYSTOSCOPY WITH RIGHT STENT REMOVAL RIGHT URETEROSCOPY STONE EXTRACTION WITH HOLMIUM LASER SURGEON:  Surgeon(s) and Role:    * Irine Seal, MD - Primary  PHYSICIAN ASSISTANT:   ASSISTANTS: none   ANESTHESIA:   general  EBL:  No intake/output data recorded.  BLOOD ADMINISTERED:none  DRAINS: none   LOCAL MEDICATIONS USED:  LIDOCAINE  and Amount: 10 ml 2% jelly    SPECIMEN:  Source of Specimen:  right ureteral stone fragments  DISPOSITION OF SPECIMEN:  to patient  COUNTS:  YES  TOURNIQUET:  * No tourniquets in log *  DICTATION: .Other Dictation: Dictation Number [id number]  PLAN OF CARE: Discharge to home after PACU  PATIENT DISPOSITION:  PACU - hemodynamically stable.   Delay start of Pharmacological VTE agent (>24hrs) due to surgical blood loss or risk of bleeding: not applicable
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
## 2017-01-06 NOTE — Transfer of Care (Signed)
Immediate Anesthesia Transfer of Care Note  Patient: Charles [name]  Procedure(s) Performed: Procedure(s): RIGHT URETEROSCOPY STONE EXTRACTION WITH HOLMIUM LASER AND POSSIBLE STENT EXCHANGE  (Right)  Patient Location: PACU  Anesthesia Type:General  Level of Consciousness: sedated  Airway & Oxygen Therapy: Patient Spontanous Breathing and Patient connected to face mask oxygen  Post-op Assessment: Report given to RN and Post -op Vital signs reviewed and stable  Post vital signs: Reviewed and stable  Last Vitals:  Vitals:   01/06/17 0539  BP: (!) 143/67  Pulse: 70  Resp: 18  Temp: 36.8 C    Last Pain:  Vitals:   01/06/17 0539  TempSrc: Oral         Complications: No apparent anesthesia complications

## 2017-01-06 NOTE — Anesthesia Procedure Notes (Signed)
Procedure Name: LMA Insertion Date/Time: 01/06/2017 7:25 AM Performed by: Lind Covert Pre-anesthesia Checklist: Patient identified, Emergency Drugs available, Suction available, Patient being monitored and Timeout performed Patient Re-evaluated:Patient Re-evaluated prior to inductionOxygen Delivery Method: Circle system utilized Preoxygenation: Pre-oxygenation with 100% oxygen Intubation Type: IV induction LMA: LMA with gastric port inserted LMA Size: 4.0 Number of attempts: 1 Placement Confirmation: positive ETCO2 and breath sounds checked- equal and bilateral Tube secured with: Tape Dental Injury: Teeth and Oropharynx as per pre-operative assessment

## 2017-01-06 NOTE — Op Note (Deleted)
  The note originally documented on this encounter has been moved the the encounter in which it belongs.  

## 2017-01-06 NOTE — Progress Notes (Signed)
Dr. Roderic Palau notified of patient's status in PACU- O.K. To go to Short Stay

## 2017-01-06 NOTE — Op Note (Signed)
NAME:  HELAMAN, KERRICK NO.:  [id number]  MEDICAL RECORD NO.:  [id number]  LOCATION:                               FACILITY:  [facility]  PHYSICIAN:  Marshall Cork. Jeffie Pollock, M.D.    DATE OF BIRTH:  [birth year]  DATE OF PROCEDURE:  01/06/2017 DATE OF DISCHARGE:                              OPERATIVE REPORT   PROCEDURE: 1. Cystoscopy with removal of right double-J stent. 2. Right ureteroscopic stone extraction with holmium laser     lithotripsy.  PREOPERATIVE DIAGNOSIS:  Right mid ureteral stone.  POSTOPERATIVE DIAGNOSIS:  Right mid ureteral stone.  SURGEON:  Marshall Cork. Jeffie Pollock, M.D.  ANESTHESIA:  General.  SPECIMEN:  Stone fragments.  DRAINS:  None.  BLOOD LOSS:  Minimal.  COMPLICATIONS:  None.  INDICATIONS:  Charles [name] is a 71 year old white male, who presented several days ago with right flank pain, underwent stenting for a 5.3 mm ureteral stone.  He returns today with pyonephrosis.  He returns today for stone removal.  FINDINGS OF PROCEDURE:  He was given Ancef, was taken to the operating room where general anesthetic was induced.  He was placed in the lithotomy position, was fitted with PAS hose.  His perineum and genitalia were prepped with Betadine solution, and he was draped in usual sterile fashion. Cystoscopy was performed using the 23-French scope and 30-degree lens. Examination revealed a normal urethra.  The external sphincter was intact.  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  Examination of the bladder revealed a stent at the right ureteral orifice with some meatal edema.  There was mild trabeculation, but no other bladder wall lesions were noted, and left ureteral orifice was unremarkable.  The stent was grasped with a grasping forceps and pulled the urethral meatus.  A Sensor guidewire was then passed to the kidney and the stent was removed.  The dual-lumen semi-rigid ureteroscope was then passed alongside  the wire up the ureteral orifice.  Stone was identified in the mid ureter and was grasped with a Zero-Tip basket.  I was unable to remove the stone intact because of some narrowing of the distal ureter, so a 365- micron holmium laser fiber was inserted, was set on 0.5 watts and 20 hertz and the stone was broken into manageable fragments which were then removed from the ureter.  Final inspection revealed minimal ureteral mucosal irritation, so it was not felt that a replacement of the stent was indicated.  The ureteroscope was removed.  The cystoscope was reinserted over the wire. The wire was removed without difficulty and the stone fragments were evacuated from the bladder.  The bladder was partially drained.  The cystoscope was removed.  The urethra was instilled with 10 mL of 2% lidocaine jelly, and B and O suppository was placed.  He was taken down from lithotomy position.  His anesthetic was reversed.  He was moved to recovery room in stable condition.  There were no complications.     Marshall Cork. Jeffie Pollock, M.D.   ______________________________ Marshall Cork. Jeffie Pollock, M.D.    JJW/MEDQ  D:  01/06/2017  T:  01/06/2017  Job:  [id number]

## 2017-01-07 ENCOUNTER — Encounter (HOSPITAL_COMMUNITY): Payer: BLUE CROSS/BLUE SHIELD

## 2017-01-07 ENCOUNTER — Encounter (HOSPITAL_COMMUNITY): Payer: Self-pay | Admitting: Urology

## 2017-01-07 MED ORDER — BAYER CONTOUR NEXT EZ W/DEVICE KIT: PACK | 2 refills | Status: DC

## 2017-01-07 MED ORDER — GLUCOSE BLOOD VI STRP: ORAL_STRIP | 5 refills | Status: DC

## 2017-01-07 NOTE — Telephone Encounter (Signed)
Refill submitted. 

## 2017-01-10 ENCOUNTER — Encounter (HOSPITAL_COMMUNITY): Payer: BLUE CROSS/BLUE SHIELD

## 2017-01-10 ENCOUNTER — Encounter: Payer: BLUE CROSS/BLUE SHIELD | Admitting: Internal Medicine

## 2017-01-12 ENCOUNTER — Encounter (HOSPITAL_COMMUNITY): Payer: BLUE CROSS/BLUE SHIELD

## 2017-01-14 ENCOUNTER — Encounter (HOSPITAL_COMMUNITY): Payer: BLUE CROSS/BLUE SHIELD

## 2017-01-17 ENCOUNTER — Encounter (HOSPITAL_COMMUNITY): Payer: BLUE CROSS/BLUE SHIELD

## 2017-01-17 ENCOUNTER — Encounter: Payer: Medicare Other | Admitting: Nutrition

## 2017-01-17 NOTE — Addendum Note (Signed)
Encounter addended by: Lowell Guitar, RN on: 01/17/2017  4:47 PM<BR>    Actions taken: Sign clinical note

## 2017-01-17 NOTE — Progress Notes (Signed)
Discharge Summary  Patient Details  Name: Charles [name] MRN: [id number] Date of Birth: 1946-02-14 Referring Provider:   Flowsheet Row CARDIAC REHAB PHASE II ORIENTATION from 11/02/2016 in [place]  Referring Provider  Shelva Majestic MD       Number of Visits: 9  Reason for Discharge:   early exit due to UTI/ renal calculi.  Pt has pending urology procedure.  Pt plans to exercise on his own once cleared medically.  Post measurements not obtained.   Smoking History:  History  Smoking Status  . Former Smoker  . Packs/day: 1.00  . Years: 5.00  . Types: Cigarettes  . Quit date: 11/22/1974  Smokeless Tobacco  . Never Used    Diagnosis:  S/P CABG x 4  ADL UCSD:   Initial Exercise Prescription:     Initial Exercise Prescription - 11/02/16 1600      Date of Initial Exercise RX and Referring Provider   Date 11/02/16   Referring Provider Shelva Majestic MD     Treadmill   MPH 1.7   Grade 0   Minutes 10   METs 2.3     Bike   Level 0.3   Minutes 10   METs 1.45     NuStep   Level 2   Minutes 10   METs 2     Track   Laps --   Minutes --   METs --     Prescription Details   Frequency (times per week) 3   Duration Progress to 30 minutes of continuous aerobic without signs/symptoms of physical distress     Intensity   THRR 40-80% of Max Heartrate 60-121   Ratings of Perceived Exertion 11-13     Progression   Progression Continue progressive overload as per policy without signs/symptoms or physical distress.     Resistance Training   Training Prescription Yes   Weight 2lbs   Reps 10-12      Discharge Exercise Prescription (Final Exercise Prescription Changes):     Exercise Prescription Changes - 12/21/16 1700      Response to Exercise   Blood Pressure (Admit) 130/70   Blood Pressure (Exercise) 128/70   Blood Pressure (Exit) 120/70   Heart Rate (Admit) 86 bpm   Heart Rate (Exercise) 88 bpm   Heart Rate (Exit) 70  bpm   Rating of Perceived Exertion (Exercise) 11   Symptoms knee pain    Comments Reviewed HEP on 12/01/16   Duration Progress to 30 minutes of continuous aerobic without signs/symptoms of physical distress   Intensity THRR unchanged     Progression   Progression Continue to progress workloads to maintain intensity without signs/symptoms of physical distress.   Average METs 1.7     Resistance Training   Training Prescription Yes   Weight 6lbs   Reps 10-12     Bike   Level 0.5   Minutes 30   METs 1.73     Home Exercise Plan   Plans to continue exercise at Home  HEP reviewed on 12/01/16   Frequency Add 2 additional days to program exercise sessions.     Exercise Review   Progression Yes      Functional Capacity:     6 Minute Walk    Row Name 11/02/16 1648         6 Minute Walk   Phase Initial     Distance 845 feet     Walk Time 6 minutes     #  of Rest Breaks 0     MPH 1.6     METS 1.16     RPE 11     VO2 Peak 4.05     Symptoms Yes (comment)     Comments hip and glute fatigue     Resting HR 85 bpm     Resting BP 98/64     Max Ex. HR 89 bpm     Max Ex. BP 120/60     2 Minute Post BP 106/70        Psychological, QOL, Others - Outcomes: PHQ 2/9: Depression screen PHQ 2/9 11/10/2016  Decreased Interest 0  Down, Depressed, Hopeless 0  PHQ - 2 Score 0    Quality of Life:     Quality of Life - 11/02/16 1641      Quality of Life Scores   Health/Function Pre 9.1 %   Socioeconomic Pre 21.06 %   Psych/Spiritual Pre 17.79 %   Family Pre 13.2 %   GLOBAL Pre 14.16 %      Personal Goals: Goals established at orientation with interventions provided to work toward goal.     Personal Goals and Risk Factors at Admission - 11/02/16 1712      Core Components/Risk Factors/Patient Goals on Admission    Weight Management Obesity;Yes   Intervention Weight Management: Develop a combined nutrition and exercise program designed to reach desired caloric intake,  while maintaining appropriate intake of nutrient and fiber, sodium and fats, and appropriate energy expenditure required for the weight goal.;Weight Management: Provide education and appropriate resources to help participant work on and attain dietary goals.;Obesity: Provide education and appropriate resources to help participant work on and attain dietary goals.   Admit Weight 280 lb 13.9 oz (127.4 kg)   Expected Outcomes Short Term: Continue to assess and modify interventions until short term weight is achieved;Long Term: Adherence to nutrition and physical activity/exercise program aimed toward attainment of established weight goal   Diabetes Yes   Intervention Provide education about signs/symptoms and action to take for hypo/hyperglycemia.;Provide education about proper nutrition, including hydration, and aerobic/resistive exercise prescription along with prescribed medications to achieve blood glucose in normal ranges: Fasting glucose 65-99 mg/dL   Expected Outcomes Short Term: Participant verbalizes understanding of the signs/symptoms and immediate care of hyper/hypoglycemia, proper foot care and importance of medication, aerobic/resistive exercise and nutrition plan for blood glucose control.;Long Term: Attainment of HbA1C < 7%.   Lipids Yes   Intervention Provide education and support for participant on nutrition & aerobic/resistive exercise along with prescribed medications to achieve LDL 70mg , HDL >40mg .   Expected Outcomes Short Term: Participant states understanding of desired cholesterol values and is compliant with medications prescribed. Participant is following exercise prescription and nutrition guidelines.;Long Term: Cholesterol controlled with medications as prescribed, with individualized exercise RX and with personalized nutrition plan. Value goals: LDL < 70mg , HDL > 40 mg.   Personal Goal Other Yes   Personal Goal Short Term:  Increase metobolism and lose weight.  Long Term:  Live  longer, Goal Weight:  225lb,   Intervention provide exercise, nutrition and risk factor reduction education classes to promote life style modification   Expected Outcomes pt will participate with individualized exercise RX and personalized nutrition plan to continue striving towards weight loss goals        Personal Goals Discharge:     Goals and Risk Factor Review    Row Name 12/21/16 1709  Core Components/Risk Factors/Patient Goals Review   Personal Goals Review Other       Review Pt is tolerating exercise progression well in cardiac rehab. Pt is limited in exercise machines due to hip and gluteal pain. Pt is able to exercise for 30 minutes on airdyne bike.       Expected Outcomes Pt will continue to improve in aerobic fitness and manage hip/gluteal pain with activity.          Nutrition & Weight - Outcomes:     Pre Biometrics - 11/02/16 1652      Pre Biometrics   Waist Circumference 48.75 inches   Hip Circumference 51.25 inches   Waist to Hip Ratio 0.95 %   Triceps Skinfold 26 mm   % Body Fat 39.1 %   Grip Strength 42.5 kg   Flexibility 9 in   Single Leg Stand 1.56 seconds       Nutrition:   Nutrition Discharge:   Education Questionnaire Score:     Knowledge Questionnaire Score - 11/02/16 1641      Knowledge Questionnaire Score   Pre Score 20/24      Goals reviewed with patient; copy given to patient.

## 2017-01-18 ENCOUNTER — Other Ambulatory Visit: Payer: Self-pay

## 2017-01-19 ENCOUNTER — Encounter (HOSPITAL_COMMUNITY): Payer: BLUE CROSS/BLUE SHIELD

## 2017-01-20 ENCOUNTER — Telehealth: Payer: Self-pay | Admitting: Cardiovascular Disease

## 2017-01-20 DIAGNOSIS — Z951 Presence of aortocoronary bypass graft: Secondary | ICD-10-CM | POA: Diagnosis not present

## 2017-01-20 NOTE — Telephone Encounter (Signed)
Faxed [facility] referral/last visit OV AVS with order to debbie at [facility]. Lm2b if there is anything else is needed

## 2017-01-21 ENCOUNTER — Encounter (HOSPITAL_COMMUNITY): Payer: BLUE CROSS/BLUE SHIELD

## 2017-01-24 ENCOUNTER — Encounter (HOSPITAL_COMMUNITY): Payer: BLUE CROSS/BLUE SHIELD

## 2017-01-24 DIAGNOSIS — Z951 Presence of aortocoronary bypass graft: Secondary | ICD-10-CM | POA: Diagnosis not present

## 2017-01-26 ENCOUNTER — Encounter (HOSPITAL_COMMUNITY): Payer: BLUE CROSS/BLUE SHIELD

## 2017-01-26 ENCOUNTER — Other Ambulatory Visit: Payer: Self-pay | Admitting: *Deleted

## 2017-01-26 DIAGNOSIS — I480 Paroxysmal atrial fibrillation: Secondary | ICD-10-CM

## 2017-01-26 DIAGNOSIS — Z951 Presence of aortocoronary bypass graft: Secondary | ICD-10-CM | POA: Diagnosis not present

## 2017-01-26 MED ORDER — RIVAROXABAN 20 MG PO TABS: 20.0000 mg | ORAL_TABLET | Freq: Every day | ORAL | 5 refills | Status: DC

## 2017-01-27 ENCOUNTER — Other Ambulatory Visit: Payer: Self-pay

## 2017-01-27 DIAGNOSIS — Z951 Presence of aortocoronary bypass graft: Secondary | ICD-10-CM | POA: Diagnosis not present

## 2017-01-27 MED ORDER — "INSULIN SYRINGE-NEEDLE U-100 30G X 1/2"" 0.5 ML MISC": 2 refills | Status: DC

## 2017-01-28 ENCOUNTER — Encounter (HOSPITAL_COMMUNITY): Payer: BLUE CROSS/BLUE SHIELD

## 2017-01-31 ENCOUNTER — Encounter (HOSPITAL_COMMUNITY): Payer: BLUE CROSS/BLUE SHIELD

## 2017-01-31 ENCOUNTER — Other Ambulatory Visit: Payer: Self-pay | Admitting: *Deleted

## 2017-01-31 MED ORDER — SOTALOL HCL 120 MG PO TABS: 120.0000 mg | ORAL_TABLET | Freq: Two times a day (BID) | ORAL | 10 refills | Status: DC

## 2017-02-01 ENCOUNTER — Ambulatory Visit: Payer: BLUE CROSS/BLUE SHIELD | Admitting: Physician Assistant

## 2017-02-02 ENCOUNTER — Encounter (HOSPITAL_COMMUNITY): Payer: BLUE CROSS/BLUE SHIELD

## 2017-02-04 ENCOUNTER — Encounter (HOSPITAL_COMMUNITY): Payer: BLUE CROSS/BLUE SHIELD

## 2017-02-04 DIAGNOSIS — N200 Calculus of kidney: Secondary | ICD-10-CM | POA: Diagnosis not present

## 2017-02-07 ENCOUNTER — Telehealth: Payer: Self-pay | Admitting: Cardiovascular Disease

## 2017-02-07 ENCOUNTER — Encounter (HOSPITAL_COMMUNITY): Payer: BLUE CROSS/BLUE SHIELD

## 2017-02-07 DIAGNOSIS — Z951 Presence of aortocoronary bypass graft: Secondary | ICD-10-CM | POA: Diagnosis not present

## 2017-02-07 NOTE — Telephone Encounter (Signed)
New Message    Please call the pt is concerned about the 8lb weight gain he had this week , he is currently taking Lasix

## 2017-02-07 NOTE — Telephone Encounter (Signed)
ATTEMPTED TO CALL PT  SOUNDED  LIKE SOMEONE  HAD   PICKED UP  BUT   DID   NOT  ANSWER   AND  THE   WAS  DISCONNECTED   UNABLE  TO  LEAVE MESSAGE  WILL TRY LATER .Adonis Housekeeper

## 2017-02-07 NOTE — Telephone Encounter (Signed)
SPOKE WITH  PT AND PT   HAD  TO RESUME  LASIX  LAST  WEEK DUE  TO  SUBSTANTIAL WEIGHT  GAIN  PER PT  WAS  AT 289.0 AND  NOW IS  DOWN  TO   271.4  REVIEWED MED LIST  AND APPEARS  PT   IS  TO  TAKE LASIX  AS NEEDED  INFORMED  PT   MAY  NOT   NEED  ANY MORE  AT  THIS  TIME  AND  TO CONTINUE  TO MONITOR  WEIGHT AND  IF NOTES  WEIGHT  GAIN TO RESTART  LASIX  AS DIRECTED  PT VERBALIZED UNDERSTANDING .Adonis Housekeeper

## 2017-02-09 ENCOUNTER — Encounter (HOSPITAL_COMMUNITY): Payer: BLUE CROSS/BLUE SHIELD

## 2017-02-09 DIAGNOSIS — Z951 Presence of aortocoronary bypass graft: Secondary | ICD-10-CM | POA: Diagnosis not present

## 2017-02-10 ENCOUNTER — Telehealth: Payer: Self-pay | Admitting: Cardiovascular Disease

## 2017-02-10 ENCOUNTER — Ambulatory Visit: Payer: BLUE CROSS/BLUE SHIELD | Admitting: Cardiovascular Disease

## 2017-02-10 NOTE — Telephone Encounter (Signed)
Please call,having a problem  With water retention. Today he real bloated in his stomach, a little short of breath and feels dizzy.

## 2017-02-10 NOTE — Telephone Encounter (Signed)
Spoke with pt states that he had an episode of SOB this morning after eating his breakfast of english muffin and a couple strips of [place] bacon. His weight was up yesterday and he took his lasix 40mg  in the afternoon then this morning his weight was down from yesterday but not back to normal so pt ate breakfast and then he noticed that he had some abdominal swelling but none of his normal LE edema. Then he noticed he was SOB so he took his lasix again and after about 1min the SOB went away. Pt states that he continued to urinate Q15 minutes. Now all abd swelling is gone. He states that he just wants to update Dr Claiborne Billings on this episode he has appt 02-21-17 with dr Claiborne Billings and he will discuss this at that time. Pt will call back if sx return.  Pt denies any other symptoms, nausea, vomiting, chest pain or pressure, no dizziness  Pt informed to go to the ER if new symptoms develop verbalizes understanding

## 2017-02-11 ENCOUNTER — Encounter (HOSPITAL_COMMUNITY): Payer: BLUE CROSS/BLUE SHIELD

## 2017-02-11 ENCOUNTER — Encounter: Payer: Self-pay | Admitting: Cardiovascular Disease

## 2017-02-11 ENCOUNTER — Encounter: Payer: Self-pay | Admitting: Physician Assistant

## 2017-02-11 ENCOUNTER — Ambulatory Visit (INDEPENDENT_AMBULATORY_CARE_PROVIDER_SITE_OTHER): Payer: BLUE CROSS/BLUE SHIELD | Admitting: Physician Assistant

## 2017-02-11 ENCOUNTER — Other Ambulatory Visit (INDEPENDENT_AMBULATORY_CARE_PROVIDER_SITE_OTHER): Payer: BLUE CROSS/BLUE SHIELD

## 2017-02-11 VITALS — BP 114/66 | HR 76 | Ht 68.0 in | Wt 277.1 lb

## 2017-02-11 DIAGNOSIS — Z794 Long term (current) use of insulin: Secondary | ICD-10-CM | POA: Diagnosis not present

## 2017-02-11 DIAGNOSIS — Z1211 Encounter for screening for malignant neoplasm of colon: Secondary | ICD-10-CM | POA: Diagnosis not present

## 2017-02-11 DIAGNOSIS — E1165 Type 2 diabetes mellitus with hyperglycemia: Secondary | ICD-10-CM | POA: Diagnosis not present

## 2017-02-11 DIAGNOSIS — Z7901 Long term (current) use of anticoagulants: Secondary | ICD-10-CM

## 2017-02-11 DIAGNOSIS — Z86010 Personal history of colonic polyps: Secondary | ICD-10-CM

## 2017-02-11 LAB — BASIC METABOLIC PANEL
BUN: 33 mg/dL — ABNORMAL HIGH (ref 6–23)
CHLORIDE: 102 meq/L (ref 96–112)
CO2: 29 meq/L (ref 19–32)
Calcium: 9.6 mg/dL (ref 8.4–10.5)
Creatinine, Ser: 1.4 mg/dL (ref 0.40–1.50)
GFR: 53.23 mL/min — ABNORMAL LOW (ref >60.00)
Glucose, Bld: 153 mg/dL — ABNORMAL HIGH (ref 70–99)
POTASSIUM: 4.4 meq/L (ref 3.5–5.1)
SODIUM: 138 meq/L (ref 135–145)

## 2017-02-11 LAB — LIPID PANEL
CHOL/HDL RATIO: 3
Cholesterol: 139 mg/dL (ref 0–200)
HDL: 45.1 mg/dL (ref >39.00)
LDL Cholesterol: 59 mg/dL (ref 0–99)
NonHDL: 93.91
TRIGLYCERIDES: 173 mg/dL — AB (ref 0.0–149.0)
VLDL: 34.6 mg/dL (ref 0.0–40.0)

## 2017-02-11 LAB — HEMOGLOBIN A1C: HEMOGLOBIN A1C: 7.4 % — AB (ref 4.6–6.5)

## 2017-02-11 NOTE — Telephone Encounter (Signed)
  02/11/2017   RE: Charles [name] DOB: Apr 09, 1946 MRN: [id number]   Dear Dr. [name],    We have scheduled the above patient for an endoscopic procedure. Our records show that he is on anticoagulation therapy.   Please advise as to how long the patient may come off his therapy of Xarelto prior to the procedure, which is scheduled for 04/05/2017.  Please fax back/ or route the completed form to [facility] at [phone].   Sincerely,    Charles [name]

## 2017-02-11 NOTE — Progress Notes (Addendum)
Subjective:    Patient ID: Charles [name], male    DOB: 11-May-1946, 71 y.o.   MRN: [id number]  HPI Charles [name]  is a pleasant 71 year old white male known to Dr. Carlean Purl, who comes in today to discuss follow-up colonoscopy. Patient has multiple medical problems. He has history of atrial fibrillation, coronary artery disease status post previous MIs, several stents and then had CABG 4 in October 2017. He is maintained on Xarelto. Also with sick sinus syndrome status post pacemaker placement, congestive heart failure with EF 45-50% He does use a CPAP without oxygen at home. He has no current GI complaints, specifically no complaints of abdominal pain and changes in bowel habits melena or hematochezia.. Previous EGD in December 2016 at the [facility] per [name] with finding of moderate pangastritis, H. pylori positive and treated Remote colonoscopy per Dr. Earlean Shawl 2004 with finding of a 15 mm pedunculated polyp which was removed, path showed an inflammatory polyp with ulceration. Patient says she has had some mild  shortness of breath over the past couple of weeks, he generally does not have to use Lasix on a daily basis but has had to use a couple of doses of Lasix recently. He says he currently feels fine and is at his baseline and denies any shortness of breath.  Review of Systems Pertinent positive and negative review of systems were noted in the above HPI section.  All other review of systems was otherwise negative.  Outpatient Encounter Prescriptions as of 02/11/2017  Medication Sig  . alfuzosin (UROXATRAL) 10 MG 24 hr tablet Take 10 mg by mouth daily with breakfast.  . aspirin EC 81 MG EC tablet Take 1 tablet (81 mg total) by mouth daily.  . Blood Glucose Monitoring Suppl (CONTOUR NEXT EZ MONITOR) w/Device KIT Check blood sugar three times a day.  . furosemide (LASIX) 40 MG tablet Take 1 tablet (40 mg total) by mouth daily. Daily for one week - then changing to just as needed - prn weight  gain/swelling (Patient taking differently: Take 40 mg by mouth as needed. )  . gabapentin (NEURONTIN) 300 MG capsule Take 1 capsule (300 mg total) by mouth 3 (three) times daily.  . Glucosamine-Chondroitin (MOVE FREE PO) Take 2 tablets by mouth daily.  [name] glucose blood (BAYER CONTOUR NEXT TEST) test strip Check blood sugar three times a day.  [name] glucose blood test strip Check blood sugar three times a day.  [name] guaiFENesin (MUCINEX) 600 MG 12 hr tablet Take 600 mg by mouth daily as needed for cough or to loosen phlegm.  [name] HYDROmorphone (DILAUDID) 2 MG tablet Take 1 tablet (2 mg total) by mouth every 6 (six) hours as needed for severe pain.  [name] insulin aspart (NOVOLOG) 100 UNIT/ML injection Inject 5-15 units in the skin before meals (Patient taking differently: Inject 5-15 Units into the skin 3 (three) times daily with meals. Inject 5-15 units in the skin before meals ( 5 units with breakfast, 10 units with lunch, 15 units with dinner))  . Insulin Degludec (TRESIBA FLEXTOUCH [place]) Inject 58 Units into the skin daily.  . Insulin Syringe-Needle U-100 (B-D INS SYRINGE 0.5CC/30GX1/2") 30G X 1/2" 0.5 ML MISC Use to inject insulin 3 times daily  . lisinopril (PRINIVIL,ZESTRIL) 5 MG tablet Take 1 tablet (5 mg total) by mouth daily.  . metFORMIN (GLUCOPHAGE-XR) 500 MG 24 hr tablet Take 3 tablets (1,500 mg total) by mouth daily with supper.  . pantoprazole (PROTONIX) 40 MG tablet Take 1 tablet (  40 mg total) by mouth daily.  . polyethylene glycol (MIRALAX / GLYCOLAX) packet Take 17 g by mouth daily.  . rivaroxaban (XARELTO) 20 MG TABS tablet Take 1 tablet (20 mg total) by mouth daily with supper.  . rosuvastatin (CRESTOR) 5 MG tablet Take 1 tablet (5 mg total) by mouth every Monday, Wednesday, and Friday.  . sotalol (BETAPACE) 120 MG tablet Take 1 tablet (120 mg total) by mouth 2 (two) times daily.  . VENTOLIN HFA 108 (90 Base) MCG/ACT inhaler Inhale 2 puffs into the lungs 2 (two) times daily as needed for wheezing  or shortness of breath.   . Chlorpheniramine Maleate (ALLERGY RELIEF PO) Take 1 tablet by mouth daily as needed (allergies).  . Chlorpheniramine-DM (CORICIDIN HBP COUGH/COLD PO) Take 1 tablet by mouth daily as needed (allergies).  . promethazine (PHENERGAN) 25 MG tablet Take 1 tablet by mouth as needed.  . [DISCONTINUED] cefdinir (OMNICEF) 300 MG capsule Take 1 capsule (300 mg total) by mouth 2 (two) times daily.   No facility-administered encounter medications on file as of 02/11/2017.    Allergies  Allergen Reactions  . Chocolate Hives, Shortness Of Breath and Swelling  . Statins Other (See Comments)    Mental changes, muscle aches  . Black Pepper [[place]]     Irritates back of throat  . Codeine Itching  . Oxytetracycline Other (See Comments)    Flushing in sunlight  . Tape Rash and Other (See Comments)    SKIN IS VERY SENSITIVE!!   Patient Active Problem List   Diagnosis Date Noted  . Pyohydronephrosis 01/01/2017  . CKD (chronic kidney disease), stage III 01/01/2017  . Kidney stone 01/01/2017  . Acute cystitis with hematuria 12/23/2016  . S/P CABG x 4 09/15/2016  . Cardiomyopathy, ischemic   . Unstable angina ([place]) 09/07/2016  . Coronary artery disease involving native coronary artery of native heart   . Uncontrolled type 2 diabetes mellitus with complication ([place])   . Chronic combined systolic and diastolic CHF (congestive heart failure) ([place])   . Sick sinus syndrome ([place])   . OSA on CPAP   . Chronic pain syndrome   . Essential hypertension   . Chest pain 08/03/2016  . Chronic venous insufficiency 07/28/2016  . Cardiac pacemaker in situ 07/28/2016  . NSTEMI (non-ST elevated myocardial infarction) ([place]) 07/28/2016  . Old inferior wall myocardial infarction   . Gastroesophageal reflux disease without esophagitis 02/05/2016  . Type II diabetes mellitus ([place]) 02/05/2016  . PAF (paroxysmal atrial fibrillation) ([place]) 03/10/2013  . Long term (current) use of anticoagulants  03/10/2013  . Hyperlipidemia 01/03/2008  . Obesity 01/03/2008  . 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 Marital status: Married    Spouse name: N/A  . Number of children: 1  . Years of education: N/A   Occupational History  . [place] History Main Topics  . Smoking status: Former Smoker    Packs/day: 1.00    Years: 5.00    Types: Cigarettes    Quit date: 11/22/1974  . Smokeless tobacco: Never Used  . Alcohol use No  . Drug use: No  . Sexual activity: Not Currently   Other Topics Concern  . Not on file   Social History Narrative   Married   Works at [employer] and also as [employer] at the [employer]: 8  Mr. Harrison family history includes Arthritis in his sister; Colon polyps in his sister; Epilepsy in his brother; Heart attack (age of onset: 47) in his mother; Hypertension in his sister; Lung cancer in his maternal grandfather; Stroke in his maternal grandmother and paternal grandfather.      Objective:    Vitals:   02/11/17 1042  BP: 114/66  Pulse: 76    Physical Exam  well-developed older white male in no acute distress, pleasant blood pressure 114/66 pulse 76, height 5 foot 8, weight 277, BMI 42.1. HEENT ;nontraumatic normocephalic EOMI PERRLA sclera anicteric, Cardiovascular; regular rate and rhythm with S1-S2, pacemaker left chest and sternotomy incisional scar Pulmonary ;clear bilaterally, Abdomen; obese soft nontender nondistended bowel sounds are active is no palpable mass or hepatosplenomegaly, Extremities; no clubbing cyanosis or edema skin warm and dry, Neuropsych ;mood and affect appropriate       Assessment & Plan:   #51 71 year old white male with remote history of inflammatory colon polyp, due for follow-up colonoscopy #2 chronic anticoagulation-on Xarelto #3 history of atrial fibrillation #4 coronary artery disease status post prior MIs,  several stents and then CABG 4 in October 2017 #5 congestive heart failure/ischemic cardiomyopathy with EF 4550% #6Sick sinus syndrome status post pacemaker #7.onset diabetes mellitus  Plan; patient will be scheduled for colonoscopy with Dr. Carlean Purl. Procedure discussed in detail with patient including risks and benefits and he is agreeable to proceed. Patient will need to hold Xarelto for 24 hours prior to colonoscopy. We will communicate with his cardiologist Dr. [name] to assure that this is acceptable for this patient.  Eren Ryser S Rotha Cassels PA-C 02/11/2017   Cc: Troy Sine, MD  Agree with Ms. Genia Harold assessment and plan. Gatha Mayer, MD, [facility]

## 2017-02-11 NOTE — Patient Instructions (Signed)

## 2017-02-13 NOTE — Telephone Encounter (Signed)
Will forward to anticoagulation team as per protocol

## 2017-02-14 ENCOUNTER — Encounter (HOSPITAL_COMMUNITY): Payer: BLUE CROSS/BLUE SHIELD

## 2017-02-14 DIAGNOSIS — Z951 Presence of aortocoronary bypass graft: Secondary | ICD-10-CM | POA: Diagnosis not present

## 2017-02-14 NOTE — Telephone Encounter (Signed)
Spoke with patient and told him that per Dr. [name] he could hold his Xarelto for 24 hours prior to procedure.  Patient agreed.

## 2017-02-14 NOTE — Telephone Encounter (Signed)
Pt takes Xarelto for afib with CHADS2 score of 4 (HTN, age, CHF, and DM). Ok to hold Xarelto for 24 hours prior to endoscopy. Clearance routed to Julieanne Cotton, [place].

## 2017-02-15 ENCOUNTER — Ambulatory Visit: Payer: Medicare Other | Admitting: Endocrinology

## 2017-02-15 DIAGNOSIS — N200 Calculus of kidney: Secondary | ICD-10-CM | POA: Diagnosis not present

## 2017-02-16 ENCOUNTER — Encounter (HOSPITAL_COMMUNITY): Payer: BLUE CROSS/BLUE SHIELD

## 2017-02-16 DIAGNOSIS — Z951 Presence of aortocoronary bypass graft: Secondary | ICD-10-CM | POA: Diagnosis not present

## 2017-02-17 ENCOUNTER — Other Ambulatory Visit: Payer: Self-pay | Admitting: *Deleted

## 2017-02-17 DIAGNOSIS — Z951 Presence of aortocoronary bypass graft: Secondary | ICD-10-CM | POA: Diagnosis not present

## 2017-02-17 MED ORDER — FUROSEMIDE 40 MG PO TABS: 40.0000 mg | ORAL_TABLET | Freq: Every day | ORAL | 0 refills | Status: DC | PRN

## 2017-02-17 NOTE — Telephone Encounter (Signed)
Note Status: Addendum Cosign: Cosign Not Required Encounter Date: 10/06/2016  Editor: Burtis Junes, NP (Nurse Practitioner)  Prior Versions: 1. Burtis Junes, NP (Nurse Practitioner) at 10/06/2016 2:35 PM - Addendum   2. Burtis Junes, NP (Nurse Practitioner) at 10/06/2016 2:34 PM - Addendum   3. Burtis Junes, NP (Nurse Practitioner) at 10/06/2016 2:12 PM - Signed    We will be checking the following labs today - BMET   Medication Instructions:    Continue with your current medicines. BUT  I am cutting the Lasix back to just one a day to take for one more week - then stop - ok to use then just as needed - for weight gain/swelling

## 2017-02-18 ENCOUNTER — Encounter (HOSPITAL_COMMUNITY): Payer: BLUE CROSS/BLUE SHIELD

## 2017-02-18 DIAGNOSIS — N200 Calculus of kidney: Secondary | ICD-10-CM | POA: Diagnosis not present

## 2017-02-21 ENCOUNTER — Encounter (HOSPITAL_COMMUNITY): Payer: BLUE CROSS/BLUE SHIELD

## 2017-02-21 DIAGNOSIS — Z951 Presence of aortocoronary bypass graft: Secondary | ICD-10-CM | POA: Diagnosis not present

## 2017-02-22 ENCOUNTER — Encounter: Payer: Self-pay | Admitting: Cardiovascular Disease

## 2017-02-22 ENCOUNTER — Ambulatory Visit (INDEPENDENT_AMBULATORY_CARE_PROVIDER_SITE_OTHER): Payer: BLUE CROSS/BLUE SHIELD | Admitting: Cardiovascular Disease

## 2017-02-22 ENCOUNTER — Encounter: Payer: BLUE CROSS/BLUE SHIELD | Admitting: Internal Medicine

## 2017-02-22 VITALS — BP 122/74 | HR 74 | Ht 69.0 in | Wt 285.0 lb

## 2017-02-22 DIAGNOSIS — Z7901 Long term (current) use of anticoagulants: Secondary | ICD-10-CM | POA: Diagnosis not present

## 2017-02-22 DIAGNOSIS — I480 Paroxysmal atrial fibrillation: Secondary | ICD-10-CM | POA: Diagnosis not present

## 2017-02-22 DIAGNOSIS — Z9989 Dependence on other enabling machines and devices: Secondary | ICD-10-CM

## 2017-02-22 DIAGNOSIS — E785 Hyperlipidemia, unspecified: Secondary | ICD-10-CM | POA: Diagnosis not present

## 2017-02-22 DIAGNOSIS — Z794 Long term (current) use of insulin: Secondary | ICD-10-CM

## 2017-02-22 DIAGNOSIS — I2510 Atherosclerotic heart disease of native coronary artery without angina pectoris: Secondary | ICD-10-CM

## 2017-02-22 DIAGNOSIS — E1159 Type 2 diabetes mellitus with other circulatory complications: Secondary | ICD-10-CM

## 2017-02-22 DIAGNOSIS — G4733 Obstructive sleep apnea (adult) (pediatric): Secondary | ICD-10-CM

## 2017-02-22 DIAGNOSIS — Z951 Presence of aortocoronary bypass graft: Secondary | ICD-10-CM

## 2017-02-22 DIAGNOSIS — R600 Localized edema: Secondary | ICD-10-CM | POA: Diagnosis not present

## 2017-02-22 MED ORDER — VALSARTAN 160 MG PO TABS: 160.0000 mg | ORAL_TABLET | Freq: Every day | ORAL | 11 refills | Status: DC
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
## 2017-02-22 NOTE — Patient Instructions (Signed)
Your physician has recommended you make the following change in your medication:   1.) STOP the lisinopril. This has been replaced with Valsartan 160 mg and has been sent to your pharmacy.  Your physician wants you to follow-up in: 6 months or sooner if needed. You will receive a reminder letter in the mail two months in advance. If you don't receive a letter, please call our office to schedule the follow-up appointment.  If you need a refill on your cardiac medications before your next appointment, please call your pharmacy.

## 2017-02-23 ENCOUNTER — Encounter (HOSPITAL_COMMUNITY): Payer: BLUE CROSS/BLUE SHIELD

## 2017-02-23 ENCOUNTER — Other Ambulatory Visit: Payer: Self-pay

## 2017-02-23 DIAGNOSIS — Z951 Presence of aortocoronary bypass graft: Secondary | ICD-10-CM | POA: Diagnosis not present

## 2017-02-23 MED ORDER — INSULIN DEGLUDEC 100 UNIT/ML ~~LOC~~ SOPN: 58.0000 [IU] | PEN_INJECTOR | Freq: Every day | SUBCUTANEOUS | 5 refills | Status: DC

## 2017-02-24 DIAGNOSIS — Z951 Presence of aortocoronary bypass graft: Secondary | ICD-10-CM | POA: Diagnosis not present

## 2017-02-25 ENCOUNTER — Encounter (HOSPITAL_COMMUNITY): Payer: BLUE CROSS/BLUE SHIELD

## 2017-02-28 ENCOUNTER — Encounter (HOSPITAL_COMMUNITY): Payer: BLUE CROSS/BLUE SHIELD

## 2017-02-28 DIAGNOSIS — Z951 Presence of aortocoronary bypass graft: Secondary | ICD-10-CM | POA: Diagnosis not present

## 2017-03-02 ENCOUNTER — Telehealth: Payer: Self-pay | Admitting: Cardiovascular Disease

## 2017-03-02 ENCOUNTER — Encounter (HOSPITAL_COMMUNITY): Payer: BLUE CROSS/BLUE SHIELD

## 2017-03-02 DIAGNOSIS — Z951 Presence of aortocoronary bypass graft: Secondary | ICD-10-CM | POA: Diagnosis not present

## 2017-03-02 NOTE — Telephone Encounter (Signed)
Pt wants to know if it is alright for him to exercise using his Bow Flex?

## 2017-03-02 NOTE — Telephone Encounter (Signed)
Returned the phone call to the patient.  He stated that he would like to know if he can use his bow flex and do some push and pull exercises. Please advise.

## 2017-03-03 DIAGNOSIS — Z951 Presence of aortocoronary bypass graft: Secondary | ICD-10-CM | POA: Diagnosis not present

## 2017-03-04 ENCOUNTER — Encounter (HOSPITAL_COMMUNITY): Payer: BLUE CROSS/BLUE SHIELD

## 2017-03-04 NOTE — Telephone Encounter (Signed)
Pt is 6 months post CABG;  Ok to use

## 2017-03-04 NOTE — Telephone Encounter (Signed)
Left a message for the patient, per DPR, that Dr. Claiborne Billings stated that it was fine if he used his Bow Flex.

## 2017-03-07 ENCOUNTER — Encounter (HOSPITAL_COMMUNITY): Payer: BLUE CROSS/BLUE SHIELD

## 2017-03-07 ENCOUNTER — Telehealth: Payer: Self-pay | Admitting: Cardiovascular Disease

## 2017-03-07 DIAGNOSIS — Z951 Presence of aortocoronary bypass graft: Secondary | ICD-10-CM | POA: Diagnosis not present

## 2017-03-07 NOTE — Telephone Encounter (Signed)
Patient calling, states that he received a letter from [facility], asking if he still took "Clopidogrel" and patient states that he does not remember this medication. Patient would like someone to verify if he was ever prescribed this medication. Charles [name] will be at rehab from 11-12:30 pm today,states that you may call home number and leave a message.Thanks.

## 2017-03-07 NOTE — Telephone Encounter (Signed)
Spoke to patient  reviewing patient's [facility] chart. Patient was discontinue from medication at discharge from hospital 09/07/16.  patient made aware. No further instruction given. Patient is still taking XARELTO

## 2017-03-09 ENCOUNTER — Other Ambulatory Visit: Payer: Self-pay

## 2017-03-09 DIAGNOSIS — Z951 Presence of aortocoronary bypass graft: Secondary | ICD-10-CM | POA: Diagnosis not present

## 2017-03-09 MED ORDER — ROSUVASTATIN CALCIUM 5 MG PO TABS: 5.0000 mg | ORAL_TABLET | ORAL | 11 refills | Status: DC

## 2017-03-09 NOTE — Telephone Encounter (Signed)
Rx(s) sent to pharmacy electronically.  

## 2017-03-10 DIAGNOSIS — Z951 Presence of aortocoronary bypass graft: Secondary | ICD-10-CM | POA: Diagnosis not present

## 2017-03-11 ENCOUNTER — Ambulatory Visit (INDEPENDENT_AMBULATORY_CARE_PROVIDER_SITE_OTHER): Payer: BLUE CROSS/BLUE SHIELD | Admitting: Endocrinology

## 2017-03-11 VITALS — BP 128/80 | HR 65 | Ht 68.0 in | Wt 284.8 lb

## 2017-03-11 DIAGNOSIS — E1165 Type 2 diabetes mellitus with hyperglycemia: Secondary | ICD-10-CM | POA: Diagnosis not present

## 2017-03-11 DIAGNOSIS — Z794 Long term (current) use of insulin: Secondary | ICD-10-CM

## 2017-03-11 DIAGNOSIS — N200 Calculus of kidney: Secondary | ICD-10-CM | POA: Diagnosis not present

## 2017-03-11 DIAGNOSIS — I2510 Atherosclerotic heart disease of native coronary artery without angina pectoris: Secondary | ICD-10-CM

## 2017-03-11 MED ORDER — FREESTYLE LIBRE READER DEVI: 1.0000 | 0 refills | Status: DC

## 2017-03-11 MED ORDER — FREESTYLE LIBRE SENSOR SYSTEM MISC: 3 refills | Status: DC
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
## 2017-03-11 NOTE — Patient Instructions (Addendum)
Tresiba 60 units  Need sugars after meals

## 2017-03-16 DIAGNOSIS — Z951 Presence of aortocoronary bypass graft: Secondary | ICD-10-CM | POA: Diagnosis not present

## 2017-03-21 DIAGNOSIS — E1142 Type 2 diabetes mellitus with diabetic polyneuropathy: Secondary | ICD-10-CM

## 2017-03-21 DIAGNOSIS — L603 Nail dystrophy: Secondary | ICD-10-CM | POA: Insufficient documentation

## 2017-03-21 DIAGNOSIS — B351 Tinea unguium: Secondary | ICD-10-CM

## 2017-03-21 HISTORY — DX: Nail dystrophy: L60.3

## 2017-03-21 HISTORY — DX: Type 2 diabetes mellitus with diabetic polyneuropathy: E11.42

## 2017-03-21 HISTORY — DX: Tinea unguium: B35.1

## 2017-03-22 ENCOUNTER — Encounter: Payer: Self-pay | Admitting: Internal Medicine

## 2017-03-23 DIAGNOSIS — Z951 Presence of aortocoronary bypass graft: Secondary | ICD-10-CM | POA: Diagnosis not present

## 2017-03-24 DIAGNOSIS — Z951 Presence of aortocoronary bypass graft: Secondary | ICD-10-CM | POA: Diagnosis not present

## 2017-03-25 DIAGNOSIS — N200 Calculus of kidney: Secondary | ICD-10-CM | POA: Diagnosis not present

## 2017-03-28 ENCOUNTER — Encounter: Payer: BLUE CROSS/BLUE SHIELD | Attending: Endocrinology | Admitting: Nutrition

## 2017-03-28 DIAGNOSIS — E118 Type 2 diabetes mellitus with unspecified complications: Secondary | ICD-10-CM

## 2017-03-28 DIAGNOSIS — Z794 Long term (current) use of insulin: Secondary | ICD-10-CM | POA: Diagnosis not present

## 2017-03-28 DIAGNOSIS — E1165 Type 2 diabetes mellitus with hyperglycemia: Secondary | ICD-10-CM | POA: Diagnosis not present

## 2017-03-28 DIAGNOSIS — Z713 Dietary counseling and surveillance: Secondary | ICD-10-CM | POA: Diagnosis not present

## 2017-03-29 NOTE — Progress Notes (Signed)
Mr. Godeaux did not bring his meter, Says he is testing only 2 hours after his meals, now, and they are all between 100-120.  He reports that he is getting very hungrey before meals, but does not test blood sugars.  He also admits that he drops low when he is working outside in the morning and after lunch.   Pt. Complaining that he has gained 25 pounds since his heart surger.   Insulin dose:  Novolog: 03/31/17.  He does not alter this dose with exercise, except that he does not take his Novolog when he is low before a meal. He says it is not high before the next meal when he does this. Tyler Aas: 28N 8AM Discussed the timing of the insulin and the fact that if he is 100 2hr. pc, then he will be low at 4hr. pc.  Goal for 2hr.pc is 150-170, and higher if exercising outside Discussed the need to reduce his Novolog when exercising outside by 20-25% to prevent hunger Typical day:6:30 up 7:30 Bfast of egg substitue.cooked in oil, 2 sausage patties, and 2 pieces of bacon, 1/2 banana, 1 english muffin, tea with splenda           5u Humalog 10:30 snack before rehab of fruit, and nuts 12-1  [place], or ham with fruit and water to drink.           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  Denies any deserts, or late night snacking Told him to test his blood sugar, and if low, to reduce his premeal Novolog by 2-4units when working outside.  He was told that the 2hr. pc readings should be 150-170 after lunch or supper

## 2017-03-30 DIAGNOSIS — Z951 Presence of aortocoronary bypass graft: Secondary | ICD-10-CM | POA: Diagnosis not present

## 2017-03-31 DIAGNOSIS — Z951 Presence of aortocoronary bypass graft: Secondary | ICD-10-CM | POA: Diagnosis not present

## 2017-03-31 NOTE — Patient Instructions (Signed)
Reduce the Novolog by 2 units before breakfast and lunch Continue to test blood sugars 2 hours after the meal ( goal is 150-170)

## 2017-04-04 ENCOUNTER — Telehealth: Payer: Self-pay | Admitting: Internal Medicine

## 2017-04-04 NOTE — Telephone Encounter (Signed)
Returned patient's call regarding his Xarelto.   He is to hold his Xarelto 24 hours clearance given by Cardiologist.  He also was asking about his Insulin and when and what he should take.  It instructed him not to take evening dose of insulin today and not to take morning dose tomorrow.  I explained this to patient and all questions were answered.

## 2017-04-05 ENCOUNTER — Ambulatory Visit (AMBULATORY_SURGERY_CENTER): Payer: BLUE CROSS/BLUE SHIELD | Admitting: Internal Medicine

## 2017-04-05 ENCOUNTER — Encounter: Payer: Self-pay | Admitting: Internal Medicine

## 2017-04-05 ENCOUNTER — Encounter: Payer: BLUE CROSS/BLUE SHIELD | Admitting: Internal Medicine

## 2017-04-05 VITALS — BP 139/91 | HR 76 | Temp 97.8°F | Resp 15 | Ht 68.0 in | Wt 277.0 lb

## 2017-04-05 DIAGNOSIS — D128 Benign neoplasm of rectum: Secondary | ICD-10-CM | POA: Diagnosis not present

## 2017-04-05 DIAGNOSIS — Z1212 Encounter for screening for malignant neoplasm of rectum: Secondary | ICD-10-CM

## 2017-04-05 DIAGNOSIS — Z1211 Encounter for screening for malignant neoplasm of colon: Secondary | ICD-10-CM | POA: Diagnosis not present

## 2017-04-05 MED ORDER — SODIUM CHLORIDE 0.9 % IV SOLN: 500.0000 mL | INTRAVENOUS | Status: DC

## 2017-04-05 NOTE — Op Note (Signed)
[facility] Patient Name: Charles [name] Procedure Date: 04/05/2017 1:38 PM MRN: [id number] Endoscopist: Gatha Mayer , MD Age: 71 Referring MD:  Date of Birth: [birth year] Gender: Male Account #: [id number] Procedure:                Colonoscopy Indications:              Screening for colorectal malignant neoplasm Medicines:                Propofol per Anesthesia, Monitored Anesthesia Care Procedure:                Pre-Anesthesia Assessment:                           - Prior to the procedure, a History and Physical                            was performed, and patient medications and                            allergies were reviewed. The patient's tolerance of                            previous anesthesia was also reviewed. The risks                            and benefits of the procedure and the sedation                            options and risks were discussed with the patient.                            All questions were answered, and informed consent                            was obtained. Prior Anticoagulants: The patient                            last took Xarelto (rivaroxaban) 2 days prior to the                            procedure. ASA Grade Assessment: III - A patient                            with severe systemic disease. After reviewing the                            risks and benefits, the patient was deemed in                            satisfactory condition to undergo the procedure.                           After obtaining informed consent, the colonoscope  was passed under direct vision. Throughout the                            procedure, the patient's blood pressure, pulse, and                            oxygen saturations were monitored continuously. The                            Model CF-HQ190L [id number]) scope was introduced                            through the anus and advanced to the the cecum,              identified by appendiceal orifice and ileocecal                            valve. The colonoscopy was performed without                            difficulty. The patient tolerated the procedure                            well. The quality of the bowel preparation was                            good. The ileocecal valve, appendiceal orifice, and                            rectum were photographed. The bowel preparation                            used was Miralax. Scope In: 1:57:15 PM Scope Out: 2:14:32 PM Scope Withdrawal Time: 0 hours 12 minutes 9 seconds  Total Procedure Duration: 0 hours 17 minutes 17 seconds  Findings:                 The perianal and digital rectal examinations were                            normal. Pertinent negatives include normal prostate                            (size, shape, and consistency).                           A 3 mm polyp was found in the rectum. The polyp was                            sessile. The polyp was removed with a cold biopsy                            forceps. Resection and retrieval were complete.  Verification of patient identification for the                            specimen was done. Estimated blood loss was minimal.                           The exam was otherwise without abnormality on                            direct and retroflexion views. Complications:            No immediate complications. Estimated Blood Loss:     Estimated blood loss was minimal. Impression:               - One 3 mm polyp in the rectum, removed with a cold                            biopsy forceps. Resected and retrieved.                           - The examination was otherwise normal on direct                            and retroflexion views. Recommendation:           - Patient has a contact number available for                            emergencies. The signs and symptoms of potential                             delayed complications were discussed with the                            patient. Return to normal activities tomorrow.                            Written discharge instructions were provided to the                            patient.                           - Resume previous diet.                           - Continue present medications.                           - Repeat colonoscopy is recommended. The                            colonoscopy date will be determined after pathology                            results from today's exam become available for  review. Gatha Mayer, MD 04/05/2017 2:25:20 PM This report has been signed electronically.

## 2017-04-05 NOTE — Progress Notes (Signed)
Called to room to assist during endoscopic procedure.  Patient ID and intended procedure confirmed with present staff. Received instructions for my participation in the procedure from the performing physician.  

## 2017-04-05 NOTE — Op Note (Signed)
To PACU VSS Report to RN 

## 2017-04-05 NOTE — Patient Instructions (Addendum)
   I found and removed one tiny polyp. I will let you know pathology results and when to have another routine colonoscopy by mail and/or My Chart.  Restart Xarelto tonight  I appreciate the opportunity to care for you. Gatha Mayer, MD, FACG   YOU HAD AN ENDOSCOPIC PROCEDURE TODAY AT [facility]:   Refer to the procedure report that was given to you for any specific questions about what was found during the examination.  If the procedure report does not answer your questions, please call your gastroenterologist to clarify.  If you requested that your care partner not be given the details of your procedure findings, then the procedure report has been included in a sealed envelope for you to review at your convenience later.  YOU SHOULD EXPECT: Some feelings of bloating in the abdomen. Passage of more gas than usual.  Walking can help get rid of the air that was put into your GI tract during the procedure and reduce the bloating. If you had a lower endoscopy (such as a colonoscopy or flexible sigmoidoscopy) you may notice spotting of blood in your stool or on the toilet paper. If you underwent a bowel prep for your procedure, you may not have a normal bowel movement for a few days.  Please Note:  You might notice some irritation and congestion in your nose or some drainage.  This is from the oxygen used during your procedure.  There is no need for concern and it should clear up in a day or so.  SYMPTOMS TO REPORT IMMEDIATELY:   Following lower endoscopy (colonoscopy or flexible sigmoidoscopy):  Excessive amounts of blood in the stool  Significant tenderness or worsening of abdominal pains  Swelling of the abdomen that is new, acute  Fever of 100F or higher For urgent or emergent issues, a gastroenterologist can be reached at any hour by calling [phone].   DIET:  We do recommend a small meal at first, but then you may proceed to your regular diet.  Drink  plenty of fluids but you should avoid alcoholic beverages for 24 hours.  ACTIVITY:  You should plan to take it easy for the rest of today and you should NOT DRIVE or use heavy machinery until tomorrow (because of the sedation medicines used during the test).    FOLLOW UP: Our staff will call the number listed on your records the next business day following your procedure to check on you and address any questions or concerns that you may have regarding the information given to you following your procedure. If we do not reach you, we will leave a message.  However, if you are feeling well and you are not experiencing any problems, there is no need to return our call.  We will assume that you have returned to your regular daily activities without incident.  If any biopsies were taken you will be contacted by phone or by letter within the next 1-3 weeks.  Please call us at [phone] if you have not heard about the biopsies in 3 weeks.    SIGNATURES/CONFIDENTIALITY: You and/or your care partner have signed paperwork which will be entered into your electronic medical record.  These signatures attest to the fact that that the information above on your After Visit Summary has been reviewed and is understood.  Full responsibility of the confidentiality of this discharge information lies with you and/or your care-partner.

## 2017-04-06 ENCOUNTER — Telehealth: Payer: Self-pay | Admitting: *Deleted

## 2017-04-06 ENCOUNTER — Telehealth: Payer: Self-pay

## 2017-04-06 ENCOUNTER — Telehealth: Payer: Self-pay | Admitting: Cardiovascular Disease

## 2017-04-06 DIAGNOSIS — Z951 Presence of aortocoronary bypass graft: Secondary | ICD-10-CM | POA: Diagnosis not present

## 2017-04-06 NOTE — Telephone Encounter (Signed)
New Message  Pt voiced wanting nurse to give him a call ASAP.  Pt would not go into detail about the reason for his call.

## 2017-04-06 NOTE — Telephone Encounter (Signed)
  Follow up Call-  Call back number 04/05/2017 10/24/2015  Post procedure Call Back phone  # [phone]  Permission to leave phone message Yes Yes  Some recent data might be hidden     Patient questions:  Do you have a fever, pain , or abdominal swelling? No. Pain Score  0 *  Have you tolerated food without any problems? Yes.    Have you been able to return to your normal activities? Yes.    Do you have any questions about your discharge instructions: Diet   No. Medications  No. Follow up visit  No.  Do you have questions or concerns about your Care? No.  Actions: * If pain score is 4 or above: No action needed, pain <4.

## 2017-04-06 NOTE — Telephone Encounter (Signed)
Left a message at [phone] for the pt.  Follow up call. maw

## 2017-04-06 NOTE — Telephone Encounter (Signed)
Returned call to patient Patient has been to see a urologist and had lab work done He took blood tests 6 weeks ago & repeat labs 3 more times First time, he was told his K+ was critically high, low vit D, calcium & creatinine were off He was Rx'ed potassium after repeat labs showed elevated K+/creatinine He states he has since stopped K+ (?) He thinks he should see a nephrologist but has not gotten a referral Attempted to access labs via [facility] but last labs from urologist were from March 2018  Will defer to [place], CMA to follow up on labs & have MD review per patient request

## 2017-04-07 DIAGNOSIS — Z951 Presence of aortocoronary bypass graft: Secondary | ICD-10-CM | POA: Diagnosis not present

## 2017-04-08 NOTE — Telephone Encounter (Signed)
F/u Message ° °Pt returning RN call. Please call back to discuss  °

## 2017-04-11 ENCOUNTER — Telehealth: Payer: Self-pay | Admitting: Cardiovascular Disease

## 2017-04-11 DIAGNOSIS — Z951 Presence of aortocoronary bypass graft: Secondary | ICD-10-CM | POA: Diagnosis not present

## 2017-04-11 NOTE — Telephone Encounter (Signed)
Follow up    Pt is calling to talk to RN. He states he will be available after 12:30.

## 2017-04-11 NOTE — Telephone Encounter (Signed)
Follow up  Pt voiced wanting to let us know his neurologist was going to fax over paperwork but for nurse to give [place] a call back.  Please f/u

## 2017-04-11 NOTE — Telephone Encounter (Signed)
Patient's wife was in for appointment and she was told per Dr Claiborne Billings to have patient to get the labs for him. Dr Claiborne Billings also recommended to wife that whatever Dr ordered the blood work might should follow up on it if it's abnormal.

## 2017-04-11 NOTE — Telephone Encounter (Signed)
Left message to call back, per DPR. 

## 2017-04-12 NOTE — Telephone Encounter (Signed)
New message    Pt calling again to see if we got the paperwork from his neurologist and states if we did not, then to call [phone] to make sure that we get that paperwork. He said no one touched base with him yesterday.

## 2017-04-12 NOTE — Telephone Encounter (Signed)
Left message to call back, per DPR. 

## 2017-04-12 NOTE — Telephone Encounter (Signed)
F/U call... Patient calling in regards to lab results from [facility]. He states that Dr. Claiborne Billings requested the labs and wanted to see you all received it. Thanks.

## 2017-04-13 DIAGNOSIS — Z951 Presence of aortocoronary bypass graft: Secondary | ICD-10-CM | POA: Diagnosis not present

## 2017-04-13 IMAGING — NM NM HEPATOBILIARY IMAGE, INC GB
3 series · 18 of 18 positions shown · non-contrast
Comparison: 02/05/2016

CLINICAL DATA: Upper abdominal pain and bloating with elevated LFTs

EXAM:
NUCLEAR MEDICINE HEPATOBILIARY IMAGING
TECHNIQUE: Sequential images of the abdomen were obtained [DATE] minutes
following intravenous administration of radiopharmaceutical.
RADIOPHARMACEUTICALS:  Five mCi Pc-TTm  Choletec IV

[Series 1: biliary · 4.14mm/px · 6 of 60 frames shown (1 of 3)]
[frame 6/60]
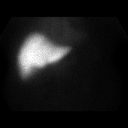
[frame 16/60]
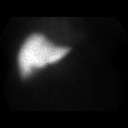
[frame 26/60]
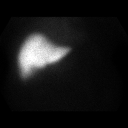
[frame 36/60]
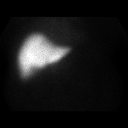
[frame 46/60]
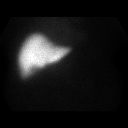
[frame 56/60]
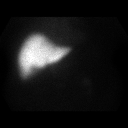

[Series 2: biliary · 4.14mm/px · 6 of 30 frames shown (2 of 3)]
[frame 3/30]
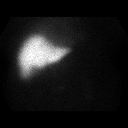
[frame 8/30]
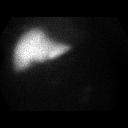
[frame 13/30]
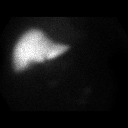
[frame 18/30]
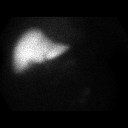
[frame 23/30]
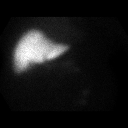
[frame 28/30]
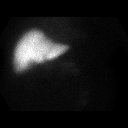

[Series 3: biliary · 4.14mm/px · 6 of 30 frames shown (3 of 3)]
[frame 3/30]
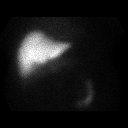
[frame 8/30]
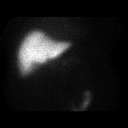
[frame 13/30]
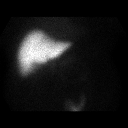
[frame 18/30]
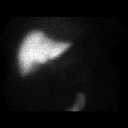
[frame 23/30]
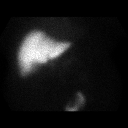
[frame 28/30]
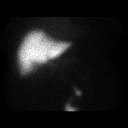

[18 of 18 positions shown; findings below may reference images not displayed]

FINDINGS: There is adequate uptake of radioactive tracer throughout the liver.
The biliary system is not well visualized although some minimal
activity passes into the small bowel. Nonvisualization of the
gallbladder is noted despite treatment with 5 mg of morphine.
IMPRESSION: Normal uptake of biliary tracer with only minimal excretion into the
biliary tree. Nonvisualization of the gallbladder is noted despite
morphine therapy. Given the poor opacification of the biliary tree
this is equivocal for cholecystitis although suggests a possible
cholestasis pattern.

## 2017-04-13 NOTE — Telephone Encounter (Signed)
Pt returned Lisa's call please give him a call back on his cell.  Per pt thank you.

## 2017-04-13 NOTE — Telephone Encounter (Signed)
Returned call to patient. Notified him that no labs are in EPIC from [facility] and none are in MD mail box. [facility] to request labs be faxed to our office. Per Earnest Bailey, with medical records, they do not have a release on file to send results to Dr. Claiborne Billings. Patient would need to sign a release for labs to be sent. Patient called with this info - he states he was told something different by [facility] yesterday, but he states he will be in town today and will stop by their office and sign release/obtain copy of labs and bring to our office for MD review.

## 2017-04-14 ENCOUNTER — Encounter: Payer: Self-pay | Admitting: Internal Medicine

## 2017-04-14 DIAGNOSIS — Z86010 Personal history of colonic polyps: Secondary | ICD-10-CM

## 2017-04-14 NOTE — Progress Notes (Signed)
Diminutive adenoma repeat colonoscopy 2023

## 2017-04-20 ENCOUNTER — Other Ambulatory Visit: Payer: Self-pay | Admitting: *Deleted

## 2017-04-20 ENCOUNTER — Telehealth: Payer: Self-pay | Admitting: Cardiovascular Disease

## 2017-04-20 NOTE — Telephone Encounter (Signed)
New Message     Pt is returning [facility] call

## 2017-04-20 NOTE — Telephone Encounter (Signed)
Returned call-aware of results and recommendation.  Verbalized understanding.

## 2017-04-26 ENCOUNTER — Encounter: Payer: Self-pay | Admitting: *Deleted

## 2017-05-11 ENCOUNTER — Other Ambulatory Visit: Payer: Self-pay

## 2017-05-11 ENCOUNTER — Ambulatory Visit (INDEPENDENT_AMBULATORY_CARE_PROVIDER_SITE_OTHER): Payer: BLUE CROSS/BLUE SHIELD | Admitting: Endocrinology

## 2017-05-11 ENCOUNTER — Encounter: Payer: Self-pay | Admitting: Endocrinology

## 2017-05-11 VITALS — BP 138/88 | HR 69 | Ht 68.0 in | Wt 289.8 lb

## 2017-05-11 DIAGNOSIS — E875 Hyperkalemia: Secondary | ICD-10-CM

## 2017-05-11 DIAGNOSIS — Z794 Long term (current) use of insulin: Secondary | ICD-10-CM | POA: Diagnosis not present

## 2017-05-11 DIAGNOSIS — I10 Essential (primary) hypertension: Secondary | ICD-10-CM

## 2017-05-11 DIAGNOSIS — E1165 Type 2 diabetes mellitus with hyperglycemia: Secondary | ICD-10-CM

## 2017-05-11 LAB — COMPREHENSIVE METABOLIC PANEL
ALBUMIN: 4.1 g/dL (ref 3.5–5.2)
ALT: 16 U/L (ref 0–53)
AST: 15 U/L (ref 0–37)
Alkaline Phosphatase: 71 U/L (ref 39–117)
BUN: 24 mg/dL — AB (ref 6–23)
CHLORIDE: 106 meq/L (ref 96–112)
CO2: 26 mEq/L (ref 19–32)
Calcium: 9.2 mg/dL (ref 8.4–10.5)
Creatinine, Ser: 1.39 mg/dL (ref 0.40–1.50)
GFR: 53.63 mL/min — ABNORMAL LOW (ref >60.00)
Glucose, Bld: 129 mg/dL — ABNORMAL HIGH (ref 70–99)
POTASSIUM: 5.3 meq/L — AB (ref 3.5–5.1)
SODIUM: 138 meq/L (ref 135–145)
Total Bilirubin: 0.3 mg/dL (ref 0.2–1.2)
Total Protein: 7.1 g/dL (ref 6.0–8.3)

## 2017-05-11 LAB — POCT GLYCOSYLATED HEMOGLOBIN (HGB A1C): Hemoglobin A1C: 6.4

## 2017-05-11 MED ORDER — SEMAGLUTIDE(0.25 OR 0.5MG/DOS) 2 MG/1.5ML ~~LOC~~ SOPN: 0.5000 mg | PEN_INJECTOR | SUBCUTANEOUS | 3 refills | Status: DC
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
## 2017-05-11 NOTE — Patient Instructions (Addendum)
Novolog 20 at supper  May reduce Novolog to 1/2 pre-exercise  Start TRULICITYwith the pen as shown once weekly on the same day of the week with 0.25 mg for the first 2 injections. With the third injection increase the dose to 0.5 mg weekly   You may inject in the stomach, thigh or arm as indicated in the brochure given.  You will feel fullness of the stomach with starting the medication and should try to keep the portions at meals small.  You may experience nausea in the first few days which usually gets better over time   I

## 2017-05-13 ENCOUNTER — Other Ambulatory Visit: Payer: Self-pay

## 2017-05-13 MED ORDER — METFORMIN HCL ER 500 MG PO TB24: 1500.0000 mg | ORAL_TABLET | Freq: Every day | ORAL | 3 refills | Status: DC

## 2017-05-20 DIAGNOSIS — M170 Bilateral primary osteoarthritis of knee: Secondary | ICD-10-CM | POA: Diagnosis not present

## 2017-05-20 DIAGNOSIS — Z79899 Other long term (current) drug therapy: Secondary | ICD-10-CM | POA: Diagnosis not present

## 2017-05-20 DIAGNOSIS — I10 Essential (primary) hypertension: Secondary | ICD-10-CM | POA: Diagnosis not present

## 2017-05-20 DIAGNOSIS — I4891 Unspecified atrial fibrillation: Secondary | ICD-10-CM | POA: Diagnosis not present

## 2017-05-20 DIAGNOSIS — E1165 Type 2 diabetes mellitus with hyperglycemia: Secondary | ICD-10-CM | POA: Diagnosis not present

## 2017-05-23 ENCOUNTER — Other Ambulatory Visit: Payer: Self-pay

## 2017-05-23 ENCOUNTER — Other Ambulatory Visit: Payer: Self-pay | Admitting: *Deleted

## 2017-05-23 MED ORDER — GABAPENTIN 300 MG PO CAPS: 300.0000 mg | ORAL_CAPSULE | Freq: Three times a day (TID) | ORAL | 3 refills | Status: DC

## 2017-05-27 DIAGNOSIS — N200 Calculus of kidney: Secondary | ICD-10-CM | POA: Diagnosis not present

## 2017-05-31 ENCOUNTER — Other Ambulatory Visit: Payer: Self-pay

## 2017-05-31 ENCOUNTER — Telehealth: Payer: Self-pay | Admitting: Endocrinology

## 2017-05-31 MED ORDER — GABAPENTIN 300 MG PO CAPS: 300.0000 mg | ORAL_CAPSULE | Freq: Three times a day (TID) | ORAL | 3 refills | Status: DC

## 2017-05-31 NOTE — Telephone Encounter (Signed)
Patient states that gabapentin (NEURONTIN) 300 MG capsule should be sent to [facility]. Please submit to [facility]

## 2017-05-31 NOTE — Telephone Encounter (Signed)
Called patient and let him know that I have sent his prescription to the [facility].

## 2017-06-03 ENCOUNTER — Telehealth: Payer: Self-pay | Admitting: Endocrinology

## 2017-06-03 DIAGNOSIS — H25813 Combined forms of age-related cataract, bilateral: Secondary | ICD-10-CM | POA: Diagnosis not present

## 2017-06-03 DIAGNOSIS — E119 Type 2 diabetes mellitus without complications: Secondary | ICD-10-CM | POA: Diagnosis not present

## 2017-06-03 DIAGNOSIS — H52203 Unspecified astigmatism, bilateral: Secondary | ICD-10-CM | POA: Diagnosis not present

## 2017-06-03 DIAGNOSIS — H43813 Vitreous degeneration, bilateral: Secondary | ICD-10-CM | POA: Diagnosis not present

## 2017-06-03 LAB — HM DIABETES EYE EXAM

## 2017-06-03 NOTE — Telephone Encounter (Signed)
Patient calling with a few concerns for provider and would like a call back as soon as possible.  He is concerned that his Semaglutide (OZEMPIC) 0.25 or 0.5 MG/DOSE SOPN [[id number]] is causing a few side effects. He states he is very weak, fatigued, and has been having an upset stomach since being prescribed it. He also states that when [facility] took his last labs a few days ago, his thyroid level came back high. He is concerned that is also due to medication.

## 2017-06-03 NOTE — Telephone Encounter (Signed)
If he is having nausea from the Ozempic medication he can stop this Instead he can start taking Victoza injections on a daily basis which can be adjusted more easily as follows: He will start taking the lowest dose of 0.6 mg that is indicated on the pen and continue until his next visit The test from the urology office is related to parathyroid and not thyroid and will discuss on his next visit

## 2017-06-03 NOTE — Telephone Encounter (Signed)
Please advise 

## 2017-06-07 ENCOUNTER — Telehealth: Payer: Self-pay | Admitting: Cardiovascular Disease

## 2017-06-07 MED ORDER — VALSARTAN 160 MG PO TABS: 160.0000 mg | ORAL_TABLET | Freq: Every day | ORAL | 11 refills | Status: DC

## 2017-06-07 NOTE — Telephone Encounter (Signed)
Patient calling, states that he currently takes Valsartan medication and he recently the FDA recalled medication for potential cancer risks. Patient would like to know what to do about the risk.

## 2017-06-07 NOTE — Telephone Encounter (Signed)
[facility] ([facility] ) will replace valsartan recalled product for new manufacturer.    Patient still had some lisinopril at home and will use until new valsartan RX received.  Also informed in not appropriate at this time to decrease xarelto dose due to risk of under-dosing and removing efficacy of medication

## 2017-06-07 NOTE — Telephone Encounter (Signed)
New message    Charles [name] from [facility] is calling about Valsartan recall. He is calling for an alternative for pt. Please call.

## 2017-06-07 NOTE — Telephone Encounter (Signed)
Pt notified Pt mentioned that he seems to be bruising a little more lately and if you want to adjust his ASA or xarelto please advise

## 2017-06-07 NOTE — Telephone Encounter (Signed)
Pt notified that this should not include medication from the [place], pt informed if concerned to call pharmacy that supplied to pt.

## 2017-06-08 NOTE — Telephone Encounter (Signed)
Gave the patient advise but he stated he feels he had a virus because he was sick for 3 days but now seems to be ok with the ozempic no more nausea I advised that if he does get the nausea and feels it is from the ozempic he can take the victoza 0.6 until he comes in next time, he stated an understanding and had no further questions

## 2017-06-09 DIAGNOSIS — I129 Hypertensive chronic kidney disease with stage 1 through stage 4 chronic kidney disease, or unspecified chronic kidney disease: Secondary | ICD-10-CM | POA: Diagnosis not present

## 2017-06-09 DIAGNOSIS — N2581 Secondary hyperparathyroidism of renal origin: Secondary | ICD-10-CM | POA: Diagnosis not present

## 2017-06-09 DIAGNOSIS — N183 Chronic kidney disease, stage 3 (moderate): Secondary | ICD-10-CM | POA: Diagnosis not present

## 2017-06-09 DIAGNOSIS — D631 Anemia in chronic kidney disease: Secondary | ICD-10-CM | POA: Diagnosis not present

## 2017-06-09 DIAGNOSIS — D509 Iron deficiency anemia, unspecified: Secondary | ICD-10-CM | POA: Diagnosis not present

## 2017-06-10 ENCOUNTER — Other Ambulatory Visit: Payer: Self-pay | Admitting: Nephrology

## 2017-06-10 ENCOUNTER — Encounter: Payer: Self-pay | Admitting: Endocrinology

## 2017-06-10 DIAGNOSIS — N183 Chronic kidney disease, stage 3 unspecified: Secondary | ICD-10-CM

## 2017-06-13 DIAGNOSIS — N183 Chronic kidney disease, stage 3 (moderate): Secondary | ICD-10-CM | POA: Diagnosis not present

## 2017-06-14 ENCOUNTER — Ambulatory Visit
Admission: RE | Admit: 2017-06-14 | Discharge: 2017-06-14 | Disposition: A | Discharge status: Home / Self Care | Payer: BLUE CROSS/BLUE SHIELD | Source: Ambulatory Visit | Attending: Nephrology | Admitting: Nephrology

## 2017-06-14 DIAGNOSIS — N183 Chronic kidney disease, stage 3 unspecified: Secondary | ICD-10-CM

## 2017-06-15 ENCOUNTER — Telehealth: Payer: Self-pay | Admitting: Cardiovascular Disease

## 2017-06-15 MED ORDER — LOSARTAN POTASSIUM 50 MG PO TABS: 50.0000 mg | ORAL_TABLET | Freq: Every day | ORAL | 3 refills | Status: DC

## 2017-06-15 NOTE — Telephone Encounter (Signed)
New message    Pt is calling stating that he is taking valsartan. He said he had some lisinopril left over and started taking that again.   Pt c/o medication issue:  1. Name of Medication: Valsartan   2. How are you currently taking this medication (dosage and times per day)? 160 mg  3. Are you having a reaction (difficulty breathing--STAT)? No   4. What is your medication issue? Being recalled. Pt states he started taking Lisinopril and is now out of that. He wants to know what he should do now?

## 2017-06-15 NOTE — Telephone Encounter (Signed)
Spoke with patient  Will d/c Valsartan 160 mg daily and start Losartan 50 mg daily per protocol. Monitor blood pressure and call if any changes. Advised patient, verbalized understanding

## 2017-06-16 DIAGNOSIS — D509 Iron deficiency anemia, unspecified: Secondary | ICD-10-CM | POA: Diagnosis not present

## 2017-06-23 ENCOUNTER — Ambulatory Visit (INDEPENDENT_AMBULATORY_CARE_PROVIDER_SITE_OTHER): Payer: BLUE CROSS/BLUE SHIELD | Admitting: Endocrinology

## 2017-06-23 ENCOUNTER — Encounter: Payer: Self-pay | Admitting: Endocrinology

## 2017-06-23 VITALS — BP 136/86 | HR 76 | Ht 68.0 in | Wt 288.0 lb

## 2017-06-23 DIAGNOSIS — Z794 Long term (current) use of insulin: Secondary | ICD-10-CM

## 2017-06-23 DIAGNOSIS — E1165 Type 2 diabetes mellitus with hyperglycemia: Secondary | ICD-10-CM

## 2017-06-23 MED ORDER — VICTOZA 18 MG/3ML ~~LOC~~ SOPN: 1.2000 mg | PEN_INJECTOR | Freq: Every day | SUBCUTANEOUS | 3 refills | Status: DC
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
## 2017-06-23 NOTE — Patient Instructions (Signed)
Start VICTOZA injection as shown once daily at the same time of the day.    Dial the dose to 5 click on the pen for the first 4-6    You may inject in the stomach, thigh or arm. You may experience nausea in the first few days which usually goes away.  You will feel fullness of the stomach with starting the medication and should try to keep the portions at meals small.   After that increase the dose to 0.6 mg daily if no nausea present. Later add 5 clicks   If any questions or concerns are present call the office or the [facility] helpline at [phone]. Visit [email or web address].[email or web address] for more useful information

## 2017-06-24 ENCOUNTER — Telehealth: Payer: Self-pay | Admitting: Cardiovascular Disease

## 2017-06-24 NOTE — Telephone Encounter (Signed)
New message    Pt c/o BP issue: STAT if pt c/o blurred vision, one-sided weakness or slurred speech  1. What are your last 5 BP readings? Higher than normal - 135/80, 139/81, 130/81, 143/83, 148/91  2. Are you having any other symptoms (ex. Dizziness, headache, blurred vision, passed out)? no  3. What is your BP issue? Pt states his new bp medication is making his bp high than normal and he was told by Dr. Claiborne Billings to let us know the most recent readings with the Losartan.

## 2017-06-24 NOTE — Telephone Encounter (Signed)
Returned call to patient.He stated he stopped Valsartan and started taking Losartan 50 mg daily 1 week ago.Stated B/P alittle elevated.Readings listed below.Advised I will send message to pharmacy for advice.

## 2017-06-27 NOTE — Telephone Encounter (Signed)
Have him increase the losartan to 100 mg daily and continue home BP checks.

## 2017-06-27 NOTE — Telephone Encounter (Signed)
Returned call to patient no answer.LMTC. 

## 2017-06-28 ENCOUNTER — Encounter: Payer: Self-pay | Admitting: *Deleted

## 2017-06-28 ENCOUNTER — Telehealth: Payer: Self-pay

## 2017-06-28 ENCOUNTER — Telehealth: Payer: Self-pay | Admitting: Endocrinology

## 2017-06-28 MED ORDER — LOSARTAN POTASSIUM 100 MG PO TABS: 100.0000 mg | ORAL_TABLET | Freq: Every day | ORAL | 6 refills | Status: DC

## 2017-06-28 NOTE — Telephone Encounter (Signed)
Patient called back and still had questions. Advised of what the OV stated for the clicks. Patient understood and had no other questions.

## 2017-06-28 NOTE — Telephone Encounter (Signed)
Called patient and LVM advising of the OV note, but to call back if any further questions.

## 2017-06-28 NOTE — Telephone Encounter (Signed)
Received call from patient.Charles [name] advised to increase Losartan to 100 mg daily.Advised continue to monitor B/P.6 month follow up visit scheduled with Dr.Kelly.Advised to call sooner if needed.

## 2017-06-28 NOTE — Telephone Encounter (Signed)
Patient called in reference to dosage questions for Rx VICTOZA 18 MG/3ML SOPN. Please call patient and advise. OK to leave message.

## 2017-06-28 NOTE — Telephone Encounter (Signed)
Attempted to contact patient regarding questions on victoza

## 2017-06-29 ENCOUNTER — Ambulatory Visit (INDEPENDENT_AMBULATORY_CARE_PROVIDER_SITE_OTHER): Payer: BLUE CROSS/BLUE SHIELD | Admitting: *Deleted

## 2017-06-29 DIAGNOSIS — I495 Sick sinus syndrome: Secondary | ICD-10-CM

## 2017-06-29 DIAGNOSIS — I480 Paroxysmal atrial fibrillation: Secondary | ICD-10-CM

## 2017-06-29 LAB — CUP PACEART INCLINIC DEVICE CHECK
Battery Impedance: 3500 Ohm
Battery Voltage: 2.75 V
Brady Statistic RA Percent Paced: 82 %
Date Time Interrogation Session: 20180808110949
Implantable Lead Implant Date: 20091007
Implantable Lead Location: 753859
Implantable Pulse Generator Implant Date: 20091007
Lead Channel Impedance Value: 578 Ohm
Lead Channel Pacing Threshold Pulse Width: 0.4 ms
Lead Channel Sensing Intrinsic Amplitude: 12 mV
Lead Channel Setting Pacing Amplitude: 2 V
Lead Channel Setting Pacing Pulse Width: 0.4 ms
Lead Channel Setting Sensing Sensitivity: 2 mV
MDC IDC LEAD IMPLANT DT: 20091007
MDC IDC LEAD LOCATION: 753860
MDC IDC MSMT LEADCHNL RA IMPEDANCE VALUE: 408 Ohm
MDC IDC MSMT LEADCHNL RA PACING THRESHOLD AMPLITUDE: 1 V
MDC IDC MSMT LEADCHNL RA PACING THRESHOLD PULSEWIDTH: 0.4 ms
MDC IDC MSMT LEADCHNL RA SENSING INTR AMPL: 1.3 mV
MDC IDC MSMT LEADCHNL RV PACING THRESHOLD AMPLITUDE: 0.75 V
MDC IDC PG SERIAL: 1266370
MDC IDC SET LEADCHNL RV PACING AMPLITUDE: 2.5 V
MDC IDC STAT BRADY RV PERCENT PACED: 1 % — AB
Pulse Gen Model: 5826

## 2017-06-29 NOTE — Progress Notes (Signed)
Pacemaker check in clinic. Normal device function. Thresholds, sensing, impedances consistent with previous measurements. Device programmed to maximize longevity. 11 mode switches (<1%), AF per rates, longest ~50min, +Xarelto. No episode triggers enabled. Device programmed at appropriate safety margins. Histogram distribution appropriate for patient activity level. Device programmed to optimize intrinsic conduction. Estimated longevity 2.75-3.5 years. Patient education completed. ROV with AS in 12/2017.

## 2017-06-29 NOTE — Patient Instructions (Addendum)
Your physician wants you to follow-up in: February 2019 with Chanetta Marshall, NP. You will receive a reminder letter in the mail two months in advance. If you don't receive a letter, please call our office to schedule the follow-up appointment.

## 2017-07-04 ENCOUNTER — Telehealth: Payer: Self-pay | Admitting: Internal Medicine

## 2017-07-04 NOTE — Telephone Encounter (Signed)
Patient had heme positive stools.  He has an appt with Ellouise Newer, [place] for 07/19/17.  He is aware of the appt.

## 2017-07-06 NOTE — Telephone Encounter (Signed)
Called patient and tried to leave a voice message but the call ended when I started talking.

## 2017-07-06 NOTE — Telephone Encounter (Signed)
Please check to see what dose Victoza he is taking.  If he has no nausea he can go up to 1.2 mg at least.

## 2017-07-06 NOTE — Telephone Encounter (Signed)
Patient sugar is going up since adding Victoza to meds. He also takes Novolog. His sugar are up to high 180's.  Please advise,   Ty,  -LL

## 2017-07-06 NOTE — Telephone Encounter (Signed)
Called patient and his blood sugars have been reading this:  10am 179 on 8/15 11:56am 164  8/14 10:41am 179  8/13 5:06pm 145 8/12 10:20am 192 8/11 7:25 am 203 8/11 12:15pm 150 8/9 5:07pm 183 8/8  Patient stated that his blood sugars have been going up since he started his Victoza.  Please advise.

## 2017-07-07 ENCOUNTER — Telehealth: Payer: Self-pay | Admitting: Endocrinology

## 2017-07-07 ENCOUNTER — Telehealth: Payer: Self-pay | Admitting: Cardiovascular Disease

## 2017-07-07 MED ORDER — LOSARTAN POTASSIUM 50 MG PO TABS: ORAL_TABLET | ORAL | 6 refills | Status: DC

## 2017-07-07 NOTE — Telephone Encounter (Signed)
New message    Pt c/o BP issue: STAT if pt c/o blurred vision, one-sided weakness or slurred speech  1. What are your last 5 BP readings? 105/54, 105/59, 109/60, 122/68, 107/60-yesterday, 133/82-this morning  2. Are you having any other symptoms (ex. Dizziness, headache, blurred vision, passed out)? Lightheaded, dizzy, weak, tired   3. What is your BP issue? Pt said he doesn't think taking a double dose of his BP medication is helping him. It makes him feel bad. He said he feels fine this morning.

## 2017-07-07 NOTE — Telephone Encounter (Signed)
Patient returning [place] B's call.  Ty,  -LL

## 2017-07-07 NOTE — Telephone Encounter (Signed)
Attempted to reach the patient . Patient was not available. Will try again at a later time.

## 2017-07-07 NOTE — Telephone Encounter (Signed)
Called patient and verified he is taking 0.6 mg of Victoza and let him know to increase to 1.2 mg per Dr. Dwyane Dee. He will contact us if he has any symptoms.

## 2017-07-07 NOTE — Telephone Encounter (Signed)
Agreed with recommendation to decrease Losartan to  75mg  daily instead of 100mg  daily if patient unable to tolerate high dose.  Okay to take 1.5 tab of 50mg  tables every day (may take all in the morning OR divide dose 50 every morning and 25mg  every evening)

## 2017-07-07 NOTE — Telephone Encounter (Signed)
Patient called w/RPH recommendations. Agreed w/plan. Rx(s) sent to pharmacy electronically.

## 2017-07-07 NOTE — Telephone Encounter (Signed)
Requested a call back from the patient to further discuss.  

## 2017-07-07 NOTE — Telephone Encounter (Signed)
Returned call to patient to patient of Dr. Claiborne Billings concerning BP Losartan recently increased from 50mg  to 100mg  as his BP was not controlled well enough on losartan 50mg   -- changed from valsartan d/t recall - note 8/3) He reports BP is 133/82 this AM - he feels better today Other readings noted below He had weakness, lightheadedness, jittery-ness on losartan 100mg , usually worse in afternoon/evening He checks home BP once in the AM and once in the PM - BP is lower in afternoon/evening  Advised would seek recommendations from clinical pharmacist for BP meds - possible BID dosing, or 50mg  QAM and 25mg  QHS?

## 2017-07-07 NOTE — Telephone Encounter (Signed)
Just FYI.

## 2017-07-07 NOTE — Telephone Encounter (Signed)
Patient wanted to speak to nurse about his [place] 36 MG/3ML SOPN. Since he began taking the medication, his numbers have been higher. Patient will be home at 12pm. Call patient to advise.

## 2017-07-07 NOTE — Telephone Encounter (Signed)
Called patient and he stated that he is taking 0.6 of the Victoza. He stated that he is not having any nausea and I have advised him to start taking 1.2 mg and he will call back if his blood sugars continue to be high or if he has any nausea or other symptoms.

## 2017-07-19 ENCOUNTER — Encounter: Payer: Self-pay | Admitting: Physician Assistant

## 2017-07-19 ENCOUNTER — Ambulatory Visit (INDEPENDENT_AMBULATORY_CARE_PROVIDER_SITE_OTHER): Payer: BLUE CROSS/BLUE SHIELD | Admitting: Physician Assistant

## 2017-07-19 VITALS — BP 114/80 | HR 70 | Ht 68.0 in | Wt 283.0 lb

## 2017-07-19 DIAGNOSIS — R195 Other fecal abnormalities: Secondary | ICD-10-CM | POA: Diagnosis not present
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
## 2017-07-19 NOTE — Patient Instructions (Signed)
Your physician has requested that you bring back or mail in these hemoccult cards in one month.

## 2017-07-20 ENCOUNTER — Telehealth: Payer: Self-pay

## 2017-07-20 ENCOUNTER — Telehealth: Payer: Self-pay | Admitting: Endocrinology

## 2017-07-20 NOTE — Telephone Encounter (Signed)
Please advise. Thank you

## 2017-07-20 NOTE — Telephone Encounter (Signed)
He may be getting confused between [place] and NovoLog.  He was taking 60 units of Tresiba previously.  Please check on this and also note down doses of NovoLog

## 2017-07-20 NOTE — Telephone Encounter (Signed)
Please look into this to see what dose of Novolog he is taking. As I talked to patient and he stated that he was doing Novolog and not [place] per last message.

## 2017-07-20 NOTE — Telephone Encounter (Signed)
Called patient and advised of MD note. He will increase the clicks, but states he is not on [place], only Novolog and Victoza. Tyler Aas not on med list.  Please advise. Thanks!

## 2017-07-20 NOTE — Telephone Encounter (Signed)
Patient does not think that his 1.2 increase on VICTOZA 18 MG/3ML SOPN is working. His numbers are still running between 140-160. Call patient to discuss an alternative and his options.

## 2017-07-20 NOTE — Telephone Encounter (Signed)
Routing to you °

## 2017-07-20 NOTE — Telephone Encounter (Signed)
He can increase the [place] by 4 units.  If he is not having any nausea with Victoza he can dial the dose 5 clicks beyond the 1.2 mg dose

## 2017-07-20 NOTE — Telephone Encounter (Signed)
See below

## 2017-07-29 ENCOUNTER — Telehealth: Payer: Self-pay

## 2017-07-29 NOTE — Telephone Encounter (Signed)
Called patient and left a voice message to check to see how he was doing on the increased Victoza and the Novolog. He can call us back if he is still having trouble or needs [place] to adjust anything.

## 2017-07-29 NOTE — Telephone Encounter (Signed)
The pt has been advised of the EGD and cardiologist sent a letter to hold [place] for 2 days prior.

## 2017-07-29 NOTE — Telephone Encounter (Signed)
Left message on machine to call back  

## 2017-07-29 NOTE — Telephone Encounter (Signed)
Patient been on Victoza almost 2 weeks and bs has went up. (180's)  Please advise,  Ty,  -LL

## 2017-07-29 NOTE — Telephone Encounter (Signed)
-----   Message from Levin Erp, [place] sent at 07/29/2017  8:36 AM EDT ----- Regarding: FW: needs EGD Borna Wessinger, please see msg from Dr. Carlean Purl and arrange for EGD with him, needs to be off Xarelto x2 d.  Thanks- JLL  ----- Message ----- From: Gatha Mayer, MD Sent: 07/28/2017   9:09 PM To: Wyline Beady, CMA, # Subject: needs EGD                                      So this guy was heme +, mildly anemic and had low iron sat and high TIBC.  Correct that he does not need another colonoscopy but he does need an EGD I think. Dx heme + stool and iron deficiency anemia  Can you get this set up - with me - Needs to hold Xarelto x 2 d so will need to check with cardiology though if we have that already from colonoscopy think ok to go ahead   Let me know  Thanks  CEG

## 2017-08-01 NOTE — Telephone Encounter (Signed)
Please see recent messages. Please advise.

## 2017-08-01 NOTE — Telephone Encounter (Signed)
I had recommended confirming what dose of Charles [name] he was taking, apparently he was not taking this

## 2017-08-01 NOTE — Telephone Encounter (Signed)
Called patient and he stated he is NOT taking the [place]. He has started taking 09-05-24 Novolog. He feels heavy after taking the Victoza and feels full. He is currently ONLY taking Victoza, Novolog, and Metformin X 3 daily at dinner. He is having blood sugar readings of 180's and 190's during the day. Ozempic made him sick to his stomach, FYI.

## 2017-08-01 NOTE — Telephone Encounter (Signed)
Patient called in reference to notes below. I informed him of note below. Patient would like to know if he needs to continue the Novolog as well. Please call patient and advise. OK to leave message.

## 2017-08-01 NOTE — Telephone Encounter (Signed)
Called patient and he stated that he will start taking the [place]. I advised to him to please check his blood sugars and keep in check with this and to call us if he starts having any low blood sugar readings so we can adjust his dosages. He agreed and will let us know if he has changes and if too low.

## 2017-08-01 NOTE — Telephone Encounter (Signed)
Routing to you °

## 2017-08-01 NOTE — Telephone Encounter (Signed)
He was not told to stop [place].  He needs to take 30 units once a day so that morning sugars are down to 120 in 1 week.  He can reduce the Victoza by 5 clicks on the pen

## 2017-08-02 ENCOUNTER — Ambulatory Visit (AMBULATORY_SURGERY_CENTER): Payer: Self-pay

## 2017-08-02 VITALS — Ht 69.0 in | Wt 282.8 lb

## 2017-08-02 DIAGNOSIS — R195 Other fecal abnormalities: Secondary | ICD-10-CM

## 2017-08-02 NOTE — Progress Notes (Signed)
Denies allergies to eggs or soy products. Denies complication of anesthesia or sedation. Denies use of weight loss medication. Denies use of O2.   Emmi instructions declined.  

## 2017-08-05 ENCOUNTER — Encounter: Payer: Self-pay | Admitting: Internal Medicine

## 2017-08-05 ENCOUNTER — Telehealth: Payer: Self-pay

## 2017-08-05 NOTE — Telephone Encounter (Signed)
-----   Message from Jeoffrey Massed, RN sent at 07/29/2017 10:54 AM EDT ----- 07/29/2017   RE: Charles [name] DOB: 27-Jan-1946 MRN: [id number]   Dear Dr Claiborne Billings,    We have scheduled the above patient for an endoscopic procedure. Our records show that he is on anticoagulation therapy.   Please advise as to how long the patient may come off his therapy of Xarelto prior to the procedure, which is scheduled for 08/12/17.  Please fax back/ or route  to Prim Morace  at [phone].   Sincerely,    Jeoffrey Massed RN

## 2017-08-08 DIAGNOSIS — N200 Calculus of kidney: Secondary | ICD-10-CM | POA: Diagnosis not present

## 2017-08-08 DIAGNOSIS — N401 Enlarged prostate with lower urinary tract symptoms: Secondary | ICD-10-CM | POA: Diagnosis not present

## 2017-08-08 DIAGNOSIS — N3941 Urge incontinence: Secondary | ICD-10-CM | POA: Diagnosis not present

## 2017-08-08 NOTE — Telephone Encounter (Signed)
Call placed to [facility] to inquire about Xarelto.

## 2017-08-08 NOTE — Telephone Encounter (Signed)
Patient with diagnosis of atrial fibrillation on Xarelto for anticoagulation.    Procedure: endoscopy Date of procedure: 08/12/17  CHADS2 score of 3  (CHF, HTN, DM2);  CHADS2-VASc score of 5  (CHF, HTN, AGE, DM2,CAD)  CrCl 50.5 (with IBW) Platelet count 222  Per office protocol, patient can hold Xarelto for 2 days prior to procedure.    Patient should restart Xarelto on the evening of procedure or day after, at discretion of procedure MD

## 2017-08-08 NOTE — Telephone Encounter (Signed)
Left message on machine to call back  

## 2017-08-08 NOTE — Telephone Encounter (Signed)
Cyril Mourning can you please review, thanks

## 2017-08-09 DIAGNOSIS — I129 Hypertensive chronic kidney disease with stage 1 through stage 4 chronic kidney disease, or unspecified chronic kidney disease: Secondary | ICD-10-CM | POA: Diagnosis not present

## 2017-08-09 DIAGNOSIS — Z23 Encounter for immunization: Secondary | ICD-10-CM | POA: Diagnosis not present

## 2017-08-09 DIAGNOSIS — D509 Iron deficiency anemia, unspecified: Secondary | ICD-10-CM | POA: Diagnosis not present

## 2017-08-09 DIAGNOSIS — N183 Chronic kidney disease, stage 3 (moderate): Secondary | ICD-10-CM | POA: Diagnosis not present

## 2017-08-09 DIAGNOSIS — D631 Anemia in chronic kidney disease: Secondary | ICD-10-CM | POA: Diagnosis not present

## 2017-08-09 NOTE — Telephone Encounter (Signed)
Left message on machine to call back message left that he can hold xarelto 2 days prior and I asked that he call to confirm he received the message

## 2017-08-09 NOTE — Telephone Encounter (Signed)
The patient has been notified of this information and all questions answered.

## 2017-08-10 DIAGNOSIS — N183 Chronic kidney disease, stage 3 (moderate): Secondary | ICD-10-CM | POA: Diagnosis not present

## 2017-08-12 ENCOUNTER — Encounter: Payer: Self-pay | Admitting: Internal Medicine

## 2017-08-12 ENCOUNTER — Ambulatory Visit (AMBULATORY_SURGERY_CENTER): Payer: BLUE CROSS/BLUE SHIELD | Admitting: Internal Medicine

## 2017-08-12 VITALS — BP 112/76 | HR 72 | Temp 98.6°F | Resp 15 | Ht 69.0 in | Wt 282.0 lb

## 2017-08-12 DIAGNOSIS — K3189 Other diseases of stomach and duodenum: Secondary | ICD-10-CM | POA: Diagnosis not present

## 2017-08-12 DIAGNOSIS — K2960 Other gastritis without bleeding: Secondary | ICD-10-CM | POA: Diagnosis not present

## 2017-08-12 DIAGNOSIS — D649 Anemia, unspecified: Secondary | ICD-10-CM | POA: Diagnosis not present

## 2017-08-12 DIAGNOSIS — R195 Other fecal abnormalities: Secondary | ICD-10-CM

## 2017-08-12 MED ORDER — SODIUM CHLORIDE 0.9 % IV SOLN: 500.0000 mL | INTRAVENOUS | Status: DC

## 2017-08-12 NOTE — Progress Notes (Signed)
To PACU, VSS. Report to RN.tb 

## 2017-08-12 NOTE — Progress Notes (Signed)
Pt's states no medical or surgical changes since previsit or office visit. 

## 2017-08-12 NOTE — Progress Notes (Signed)
Called to room to assist during endoscopic procedure.  Patient ID and intended procedure confirmed with present staff. Received instructions for my participation in the procedure from the performing physician.  

## 2017-08-12 NOTE — Patient Instructions (Addendum)
There are some erosions (tiny ulcers) in the stomach. Bleeding can be coming from here. 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 Once biopsies back I will let you know results and plans. Nothing looks like cancer and all else ok.  Restart Xarelto today  I appreciate the opportunity to care for you. Gatha Mayer, MD, FACG   YOU HAD AN ENDOSCOPIC PROCEDURE TODAY AT [facility]:   Refer to the procedure report that was given to you for any specific questions about what was found during the examination.  If the procedure report does not answer your questions, please call your gastroenterologist to clarify.  If you requested that your care partner not be given the details of your procedure findings, then the procedure report has been included in a sealed envelope for you to review at your convenience later.  YOU SHOULD EXPECT: Some feelings of bloating in the abdomen. Passage of more gas than usual.  Walking can help get rid of the air that was put into your GI tract during the procedure and reduce the bloating. If you had a lower endoscopy (such as a colonoscopy or flexible sigmoidoscopy) you may notice spotting of blood in your stool or on the toilet paper. If you underwent a bowel prep for your procedure, you may not have a normal bowel movement for a few days.  Please Note:  You might notice some irritation and congestion in your nose or some drainage.  This is from the oxygen used during your procedure.  There is no need for concern and it should clear up in a day or so.  SYMPTOMS TO REPORT IMMEDIATELY:   Following upper endoscopy (EGD)  Vomiting of blood or coffee ground material  New chest pain or pain under the shoulder blades  Painful or persistently difficult swallowing  New shortness of breath  Fever of 100F or higher  Black,  tarry-looking stools  For urgent or emergent issues, a gastroenterologist can be reached at any hour by calling [phone].  MAy resume Xarelto today!  DIET:  We do recommend a small meal at first, but then you may proceed to your regular diet.  Drink plenty of fluids but you should avoid alcoholic beverages for 24 hours.  ACTIVITY:  You should plan to take it easy for the rest of today and you should NOT DRIVE or use heavy machinery until tomorrow (because of the sedation medicines used during the test).    FOLLOW UP: Our staff will call the number listed on your records the next business day following your procedure to check on you and address any questions or concerns that you may have regarding the information given to you following your procedure. If we do not reach you, we will leave a message.  However, if you are feeling well and you are not experiencing any problems, there is no need to return our call.  We will assume that you have returned to your regular daily activities without incident.  If any biopsies were taken you will be contacted by phone or by letter within the next 1-3 weeks.  Please call us at [phone] if you have not heard about the biopsies in 3 weeks.    SIGNATURES/CONFIDENTIALITY: You and/or your care partner have signed paperwork which will be entered into your electronic medical  record.  These signatures attest to the fact that that the information above on your After Visit Summary has been reviewed and is understood.  Full responsibility of the confidentiality of this discharge information lies with you and/or your care-partner.  Thank you for letting us take care of your healthcare needs today.

## 2017-08-12 NOTE — Op Note (Signed)
[facility] Patient Name: Monte Zinni Procedure Date: 08/12/2017 9:36 AM MRN: [id number] Endoscopist: Gatha Mayer , MD Age: 71 Referring MD:  Date of Birth: 1946-02-10 Gender: Male Account #: [id number] Procedure:                Upper GI endoscopy Indications:              Iron deficiency anemia, Heme positive stool Medicines:                Propofol per Anesthesia, Monitored Anesthesia Care Procedure:                Pre-Anesthesia Assessment:                           - Prior to the procedure, a History and Physical                            was performed, and patient medications and                            allergies were reviewed. The patient's tolerance of                            previous anesthesia was also reviewed. The risks                            and benefits of the procedure and the sedation                            options and risks were discussed with the patient.                            All questions were answered, and informed consent                            was obtained. Prior Anticoagulants: The patient                            last took Xarelto (rivaroxaban) 2 days prior to the                            procedure and last took aspirin on the day of the                            procedure. ASA Grade Assessment: III - A patient                            with severe systemic disease. After reviewing the                            risks and benefits, the patient was deemed in                            satisfactory condition to undergo the procedure.  After obtaining informed consent, the endoscope was                            passed under direct vision. Throughout the                            procedure, the patient's blood pressure, pulse, and                            oxygen saturations were monitored continuously. The                            Endoscope was introduced through the mouth, and                   advanced to the second part of duodenum. The upper                            GI endoscopy was accomplished without difficulty.                            The patient tolerated the procedure well. Scope In: Scope Out: Findings:                 Multiple dispersed, small non-bleeding erosions                            were found in the prepyloric region of the stomach.                            There were stigmata of recent bleeding. Biopsies                            were taken with a cold forceps for histology.                            Verification of patient identification for the                            specimen was done. Estimated blood loss was minimal.                           The cardia and gastric fundus were normal on                            retroflexion.                           The exam was otherwise without abnormality. Complications:            No immediate complications. Estimated Blood Loss:     Estimated blood loss was minimal. Impression:               - Non-bleeding erosive gastropathy. Biopsied.                           -  The examination was otherwise normal. Recommendation:           - Patient has a contact number available for                            emergencies. The signs and symptoms of potential                            delayed complications were discussed with the                            patient. Return to normal activities tomorrow.                            Written discharge instructions were provided to the                            patient.                           - Resume previous diet.                           - Continue present medications.                           - Resume Xarelto (rivaroxaban) at prior dose today.                           - Await pathology results.                           - He had H pylori in 2016, ? if was not eradicated                           If these bxs negative than will need to hold  PPI x                            2 weeks and check stool antigen Gatha Mayer, MD 08/12/2017 10:02:17 AM This report has been signed electronically.

## 2017-08-15 ENCOUNTER — Other Ambulatory Visit: Payer: Self-pay | Admitting: *Deleted

## 2017-08-15 ENCOUNTER — Telehealth: Payer: Self-pay | Admitting: *Deleted

## 2017-08-15 NOTE — Telephone Encounter (Signed)
  Follow up Call-  Call back number 08/12/2017 04/05/2017 10/24/2015  Post procedure Call Back phone  # [phone]  Permission to leave phone message Yes Yes Yes  Some recent data might be hidden     Patient questions:  Do you have a fever, pain , or abdominal swelling? No. Pain Score  0 *  Have you tolerated food without any problems? Yes.    Have you been able to return to your normal activities? Yes.    Do you have any questions about your discharge instructions: Diet   No. Medications  No. Follow up visit  No.  Do you have questions or concerns about your Care? No.  Actions: * If pain score is 4 or above: No action needed, pain <4.

## 2017-08-16 MED ORDER — FUROSEMIDE 40 MG PO TABS: 40.0000 mg | ORAL_TABLET | Freq: Every day | ORAL | 1 refills | Status: DC | PRN

## 2017-08-17 ENCOUNTER — Other Ambulatory Visit (INDEPENDENT_AMBULATORY_CARE_PROVIDER_SITE_OTHER): Payer: BLUE CROSS/BLUE SHIELD

## 2017-08-17 DIAGNOSIS — Z794 Long term (current) use of insulin: Secondary | ICD-10-CM | POA: Diagnosis not present

## 2017-08-17 DIAGNOSIS — E1165 Type 2 diabetes mellitus with hyperglycemia: Secondary | ICD-10-CM

## 2017-08-17 LAB — BASIC METABOLIC PANEL
BUN: 20 mg/dL (ref 6–23)
CO2: 27 mEq/L (ref 19–32)
Calcium: 9.2 mg/dL (ref 8.4–10.5)
Chloride: 104 mEq/L (ref 96–112)
Creatinine, Ser: 1.26 mg/dL (ref 0.40–1.50)
GFR: 60.02 mL/min (ref >60.00)
Glucose, Bld: 176 mg/dL — ABNORMAL HIGH (ref 70–99)
Potassium: 4.9 mEq/L (ref 3.5–5.1)
Sodium: 138 mEq/L (ref 135–145)

## 2017-08-18 ENCOUNTER — Telehealth: Payer: Self-pay | Admitting: Endocrinology

## 2017-08-18 ENCOUNTER — Other Ambulatory Visit: Payer: Self-pay

## 2017-08-18 LAB — FRUCTOSAMINE: FRUCTOSAMINE: 269 umol/L (ref 0–285)

## 2017-08-18 MED ORDER — "INSULIN SYRINGE-NEEDLE U-100 30G X 1/2"" 0.5 ML MISC": 2 refills | Status: DC

## 2017-08-18 NOTE — Telephone Encounter (Signed)
MEDICATION: syringes  PHARMACY:  [facility] [address]  IS THIS A 90 DAY SUPPLY : yes  IS PATIENT OUT OF MEDICATION: no  IF NOT; HOW MUCH IS LEFT: 3 days left  LAST APPOINTMENT DATE: @8 /12/2016  NEXT APPOINTMENT DATE:@10 /12/2016  OTHER COMMENTS:    **Let patient know to contact pharmacy at the end of the day to make sure medication is ready. **  ** Please notify patient to allow 48-72 hours to process**  **Encourage patient to contact the pharmacy for refills or they can request refills through [facility]**

## 2017-08-18 NOTE — Telephone Encounter (Signed)
Called patient and let him know that I have sent over his syringes to the pharmacy for him.

## 2017-08-19 ENCOUNTER — Other Ambulatory Visit: Payer: BLUE CROSS/BLUE SHIELD

## 2017-08-19 DIAGNOSIS — N183 Chronic kidney disease, stage 3 (moderate): Secondary | ICD-10-CM | POA: Diagnosis not present

## 2017-08-19 DIAGNOSIS — G4733 Obstructive sleep apnea (adult) (pediatric): Secondary | ICD-10-CM | POA: Diagnosis not present

## 2017-08-19 DIAGNOSIS — I129 Hypertensive chronic kidney disease with stage 1 through stage 4 chronic kidney disease, or unspecified chronic kidney disease: Secondary | ICD-10-CM | POA: Diagnosis not present

## 2017-08-21 NOTE — Progress Notes (Signed)
Call from office  bxs show inflammation - no cancer  I want to see if H pylori is gone (had in past) - none seen on bxs but was on PPI  Needs to hold his PPI (pantoprazole) x 2 weeks  Then do H pylori stool antigen  Dx erosive gastritis Then restart PPI  No letter or recall from [facility]

## 2017-08-22 ENCOUNTER — Other Ambulatory Visit: Payer: Self-pay | Admitting: *Deleted

## 2017-08-22 ENCOUNTER — Other Ambulatory Visit: Payer: Self-pay

## 2017-08-22 DIAGNOSIS — E119 Type 2 diabetes mellitus without complications: Secondary | ICD-10-CM | POA: Diagnosis not present

## 2017-08-22 DIAGNOSIS — A048 Other specified bacterial intestinal infections: Secondary | ICD-10-CM

## 2017-08-22 DIAGNOSIS — M170 Bilateral primary osteoarthritis of knee: Secondary | ICD-10-CM | POA: Diagnosis not present

## 2017-08-22 DIAGNOSIS — I4891 Unspecified atrial fibrillation: Secondary | ICD-10-CM | POA: Diagnosis not present

## 2017-08-22 DIAGNOSIS — I480 Paroxysmal atrial fibrillation: Secondary | ICD-10-CM

## 2017-08-22 DIAGNOSIS — I2510 Atherosclerotic heart disease of native coronary artery without angina pectoris: Secondary | ICD-10-CM | POA: Diagnosis not present

## 2017-08-22 MED ORDER — RIVAROXABAN 20 MG PO TABS: 20.0000 mg | ORAL_TABLET | Freq: Every day | ORAL | 0 refills | Status: DC

## 2017-08-22 NOTE — Telephone Encounter (Signed)
Age 71 years Wt 128.4kg 07/19/2017 08/17/2017 SrCr 1.26 01/03/2017 Hgb 11.9 HCT 37.5 To see Dr Claiborne Billings on 08/30/2017 so will only refill Xarelto for 1 month CrCl 99.07 Refill done for Xarelto 20 mg daily as requested

## 2017-08-23 ENCOUNTER — Ambulatory Visit: Payer: BLUE CROSS/BLUE SHIELD | Admitting: Endocrinology

## 2017-08-30 ENCOUNTER — Ambulatory Visit: Payer: BLUE CROSS/BLUE SHIELD | Admitting: Cardiovascular Disease

## 2017-08-30 DIAGNOSIS — R8271 Bacteriuria: Secondary | ICD-10-CM | POA: Diagnosis not present

## 2017-08-30 DIAGNOSIS — N200 Calculus of kidney: Secondary | ICD-10-CM | POA: Diagnosis not present

## 2017-09-02 ENCOUNTER — Ambulatory Visit: Payer: BLUE CROSS/BLUE SHIELD | Admitting: Endocrinology
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
## 2017-09-08 ENCOUNTER — Encounter: Payer: Self-pay | Admitting: Endocrinology

## 2017-09-08 ENCOUNTER — Ambulatory Visit (INDEPENDENT_AMBULATORY_CARE_PROVIDER_SITE_OTHER): Payer: BLUE CROSS/BLUE SHIELD | Admitting: Endocrinology

## 2017-09-08 VITALS — BP 138/80 | HR 68 | Ht 68.0 in | Wt 279.2 lb

## 2017-09-08 DIAGNOSIS — Z794 Long term (current) use of insulin: Secondary | ICD-10-CM

## 2017-09-08 DIAGNOSIS — I10 Essential (primary) hypertension: Secondary | ICD-10-CM | POA: Diagnosis not present

## 2017-09-08 DIAGNOSIS — E1165 Type 2 diabetes mellitus with hyperglycemia: Secondary | ICD-10-CM | POA: Diagnosis not present

## 2017-09-08 LAB — POCT GLYCOSYLATED HEMOGLOBIN (HGB A1C): Hemoglobin A1C: 6.3

## 2017-09-08 NOTE — Patient Instructions (Addendum)
64 Tresiba and use this to keep am sugar <130  Check blood sugars on waking up  4-5/7  Also check blood sugars about 2 hours after a meal and do this after different meals by rotation  Recommended blood sugar levels on waking up is 90-130 and about 2 hours after meal is 130-160  Please bring your blood sugar monitor to each visit, thank you

## 2017-09-09 ENCOUNTER — Other Ambulatory Visit (INDEPENDENT_AMBULATORY_CARE_PROVIDER_SITE_OTHER): Payer: BLUE CROSS/BLUE SHIELD

## 2017-09-09 ENCOUNTER — Telehealth: Payer: Self-pay

## 2017-09-09 DIAGNOSIS — R195 Other fecal abnormalities: Secondary | ICD-10-CM

## 2017-09-09 LAB — HEMOCCULT SLIDES (X 3 CARDS)
FECAL OCCULT BLD: NEGATIVE
OCCULT 1: POSITIVE — AB
OCCULT 2: POSITIVE — AB
OCCULT 3: NEGATIVE
OCCULT 4: NEGATIVE
OCCULT 5: NEGATIVE

## 2017-09-09 NOTE — Telephone Encounter (Signed)
Charles [name] in lab called to inform that pt has (2) positive heme cards.

## 2017-09-12 NOTE — Progress Notes (Signed)
Probably does not need another procedure - has had colon and EGD this year  He was supposed to do H pylori stool ag - Sheri called him about that but he has not yet done  Am ccing her to clarify this

## 2017-09-16 ENCOUNTER — Other Ambulatory Visit: Payer: Self-pay

## 2017-09-20 ENCOUNTER — Telehealth: Payer: Self-pay | Admitting: Internal Medicine

## 2017-09-20 NOTE — Telephone Encounter (Signed)
Patient notified of the results and will come for labs at his next convenience

## 2017-09-21 ENCOUNTER — Other Ambulatory Visit: Payer: Self-pay

## 2017-09-21 MED ORDER — PANTOPRAZOLE SODIUM 40 MG PO TBEC: 40.0000 mg | DELAYED_RELEASE_TABLET | Freq: Every day | ORAL | 6 refills | Status: DC

## 2017-09-21 NOTE — Progress Notes (Signed)
No but he has not had his f/u CBC amm ccing Sheri to [employer] him

## 2017-09-27 ENCOUNTER — Other Ambulatory Visit: Payer: Self-pay

## 2017-09-27 ENCOUNTER — Other Ambulatory Visit: Payer: BLUE CROSS/BLUE SHIELD

## 2017-09-27 DIAGNOSIS — I480 Paroxysmal atrial fibrillation: Secondary | ICD-10-CM

## 2017-09-27 DIAGNOSIS — A020 Salmonella enteritis: Secondary | ICD-10-CM

## 2017-09-27 DIAGNOSIS — A048 Other specified bacterial intestinal infections: Secondary | ICD-10-CM

## 2017-09-27 MED ORDER — RIVAROXABAN 20 MG PO TABS: 20.0000 mg | ORAL_TABLET | Freq: Every day | ORAL | 6 refills | Status: DC

## 2017-09-28 ENCOUNTER — Other Ambulatory Visit: Payer: BLUE CROSS/BLUE SHIELD

## 2017-09-28 ENCOUNTER — Telehealth: Payer: Self-pay | Admitting: Endocrinology

## 2017-09-28 ENCOUNTER — Other Ambulatory Visit: Payer: Self-pay

## 2017-09-28 ENCOUNTER — Telehealth: Payer: Self-pay | Admitting: *Deleted

## 2017-09-28 DIAGNOSIS — A048 Other specified bacterial intestinal infections: Secondary | ICD-10-CM | POA: Diagnosis not present

## 2017-09-28 DIAGNOSIS — A020 Salmonella enteritis: Secondary | ICD-10-CM

## 2017-09-28 MED ORDER — METFORMIN HCL ER 500 MG PO TB24: 1500.0000 mg | ORAL_TABLET | Freq: Every day | ORAL | 3 refills | Status: DC

## 2017-09-28 NOTE — Telephone Encounter (Signed)
Patient left a msg on the refill vm requesting an alternative for the xarelto as the rx is over $400 and he is unable to afford this. Patient can be reached at [phone]. Thanks, MI

## 2017-09-29 LAB — HELICOBACTER PYLORI  SPECIAL ANTIGEN
MICRO NUMBER: 81253981
SPECIMEN QUALITY: ADEQUATE

## 2017-09-29 NOTE — Telephone Encounter (Signed)
Left message to call back  

## 2017-09-29 NOTE — Telephone Encounter (Signed)
Patient was talking with [facility] yesterday but was cut off. When he tried to call back it was after hours. Requesting Megan to call him at ph# [phone]

## 2017-09-30 NOTE — Progress Notes (Signed)
Please let him know that H pylori is gone Should be back on daily PPI See me prn Let me know if he has any ? Or concerns

## 2017-10-03 MED ORDER — WARFARIN SODIUM 5 MG PO TABS: ORAL_TABLET | ORAL | 1 refills | Status: DC

## 2017-10-03 NOTE — Telephone Encounter (Signed)
Spoke with pt and his initial trial period of the medication is over when given the Rx last year. It is not $400 for 30 day RX and he cannot afford. Pt stated he has been on Coumidan before and willing to back on it if that is what he has to do. Pt stated he can have blood work checked by his PCP in [place].   Pt has not taken his Xarelto in 3 days, as he is out, but has been taking 81mg  ASA daily. Educated they do not work the same but we will get this address to make sure he is covered in the meantime.   Pt also instructed find out from Prescription plan to talk about coverage of his medications. Told pt will send request to Dr. Claiborne Billings and PharmD to evaluated and someone will call him back with a plan. He stated it is okay for [place] to leave a message on his phone if he dose not answer.

## 2017-10-03 NOTE — Telephone Encounter (Signed)
ok 

## 2017-10-03 NOTE — Telephone Encounter (Signed)
Follow up    Pt c/o medication issue:  1. Name of Medication: Xarelto  2. How are you currently taking this medication (dosage and times per day)? 1x a day   3. Are you having a reaction (difficulty breathing--STAT)?  no  4. What is your medication issue?  $400 for him to get 30 tablets he can not afford it

## 2017-10-03 NOTE — Telephone Encounter (Signed)
LMOM; Patient to call back   We can see patient tomorrow 11/13 at 1:30pm for transition to warfarin. Also able to supply 1-2 weeks of samples until patient assistance application completed or able to come to coumadin clinic appointment

## 2017-10-07 ENCOUNTER — Other Ambulatory Visit: Payer: Self-pay | Admitting: Endocrinology

## 2017-10-07 ENCOUNTER — Telehealth: Payer: Self-pay

## 2017-10-07 DIAGNOSIS — B356 Tinea cruris: Secondary | ICD-10-CM | POA: Diagnosis not present

## 2017-10-07 DIAGNOSIS — Z7901 Long term (current) use of anticoagulants: Secondary | ICD-10-CM | POA: Diagnosis not present

## 2017-10-07 DIAGNOSIS — Z6841 Body Mass Index (BMI) 40.0 and over, adult: Secondary | ICD-10-CM | POA: Diagnosis not present

## 2017-10-07 DIAGNOSIS — I4891 Unspecified atrial fibrillation: Secondary | ICD-10-CM | POA: Diagnosis not present

## 2017-10-07 NOTE — Telephone Encounter (Signed)
Attempted to contact patient to see if he needed further assistance since his discussion with Megan. Left call back number.

## 2017-10-10 ENCOUNTER — Ambulatory Visit (INDEPENDENT_AMBULATORY_CARE_PROVIDER_SITE_OTHER): Payer: BLUE CROSS/BLUE SHIELD | Admitting: Pharmacist Clinician (PhC)/ Clinical Pharmacy Specialist

## 2017-10-10 DIAGNOSIS — I4891 Unspecified atrial fibrillation: Secondary | ICD-10-CM | POA: Diagnosis not present

## 2017-10-10 DIAGNOSIS — Z7901 Long term (current) use of anticoagulants: Secondary | ICD-10-CM

## 2017-10-10 HISTORY — DX: Unspecified atrial fibrillation: I48.91

## 2017-10-10 LAB — POCT INR: INR: 2.3

## 2017-10-12 DIAGNOSIS — I4891 Unspecified atrial fibrillation: Secondary | ICD-10-CM | POA: Diagnosis not present

## 2017-10-25 DIAGNOSIS — Z7901 Long term (current) use of anticoagulants: Secondary | ICD-10-CM | POA: Diagnosis not present

## 2017-10-28 DIAGNOSIS — Z7901 Long term (current) use of anticoagulants: Secondary | ICD-10-CM | POA: Diagnosis not present

## 2017-11-03 DIAGNOSIS — Z7901 Long term (current) use of anticoagulants: Secondary | ICD-10-CM | POA: Diagnosis not present

## 2017-11-07 ENCOUNTER — Other Ambulatory Visit: Payer: Self-pay

## 2017-11-07 MED ORDER — INSULIN ASPART 100 UNIT/ML ~~LOC~~ SOLN: SUBCUTANEOUS | 11 refills | Status: DC

## 2017-11-10 DIAGNOSIS — Z7901 Long term (current) use of anticoagulants: Secondary | ICD-10-CM | POA: Diagnosis not present

## 2017-11-20 IMAGING — CR DG CHEST 2V
2 series · 2 of 2 positions shown · non-contrast
Comparison: Radiographs September 07, 2016.

CLINICAL DATA: Preop for coronary artery bypass graft.

EXAM:
CHEST  2 VIEW

[chest pa]
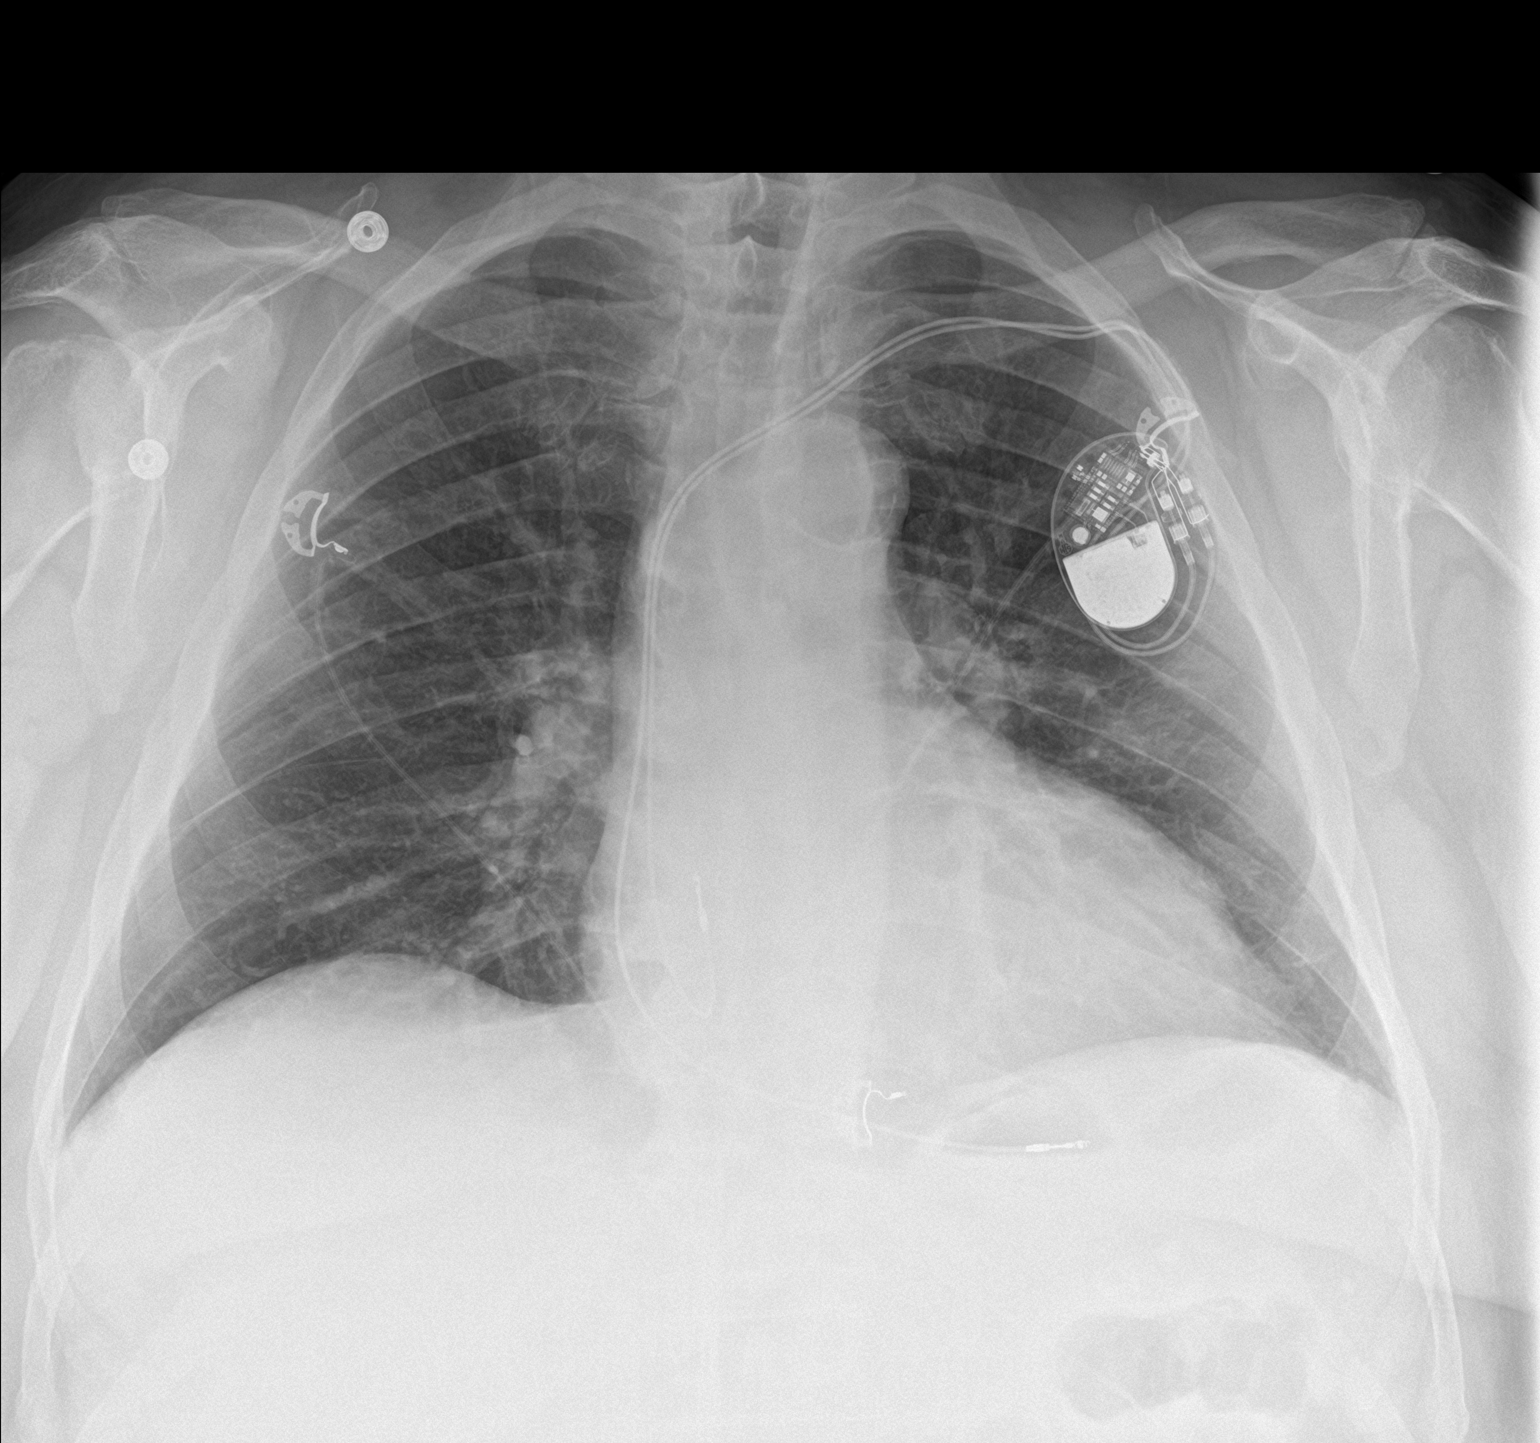

[chest lat]
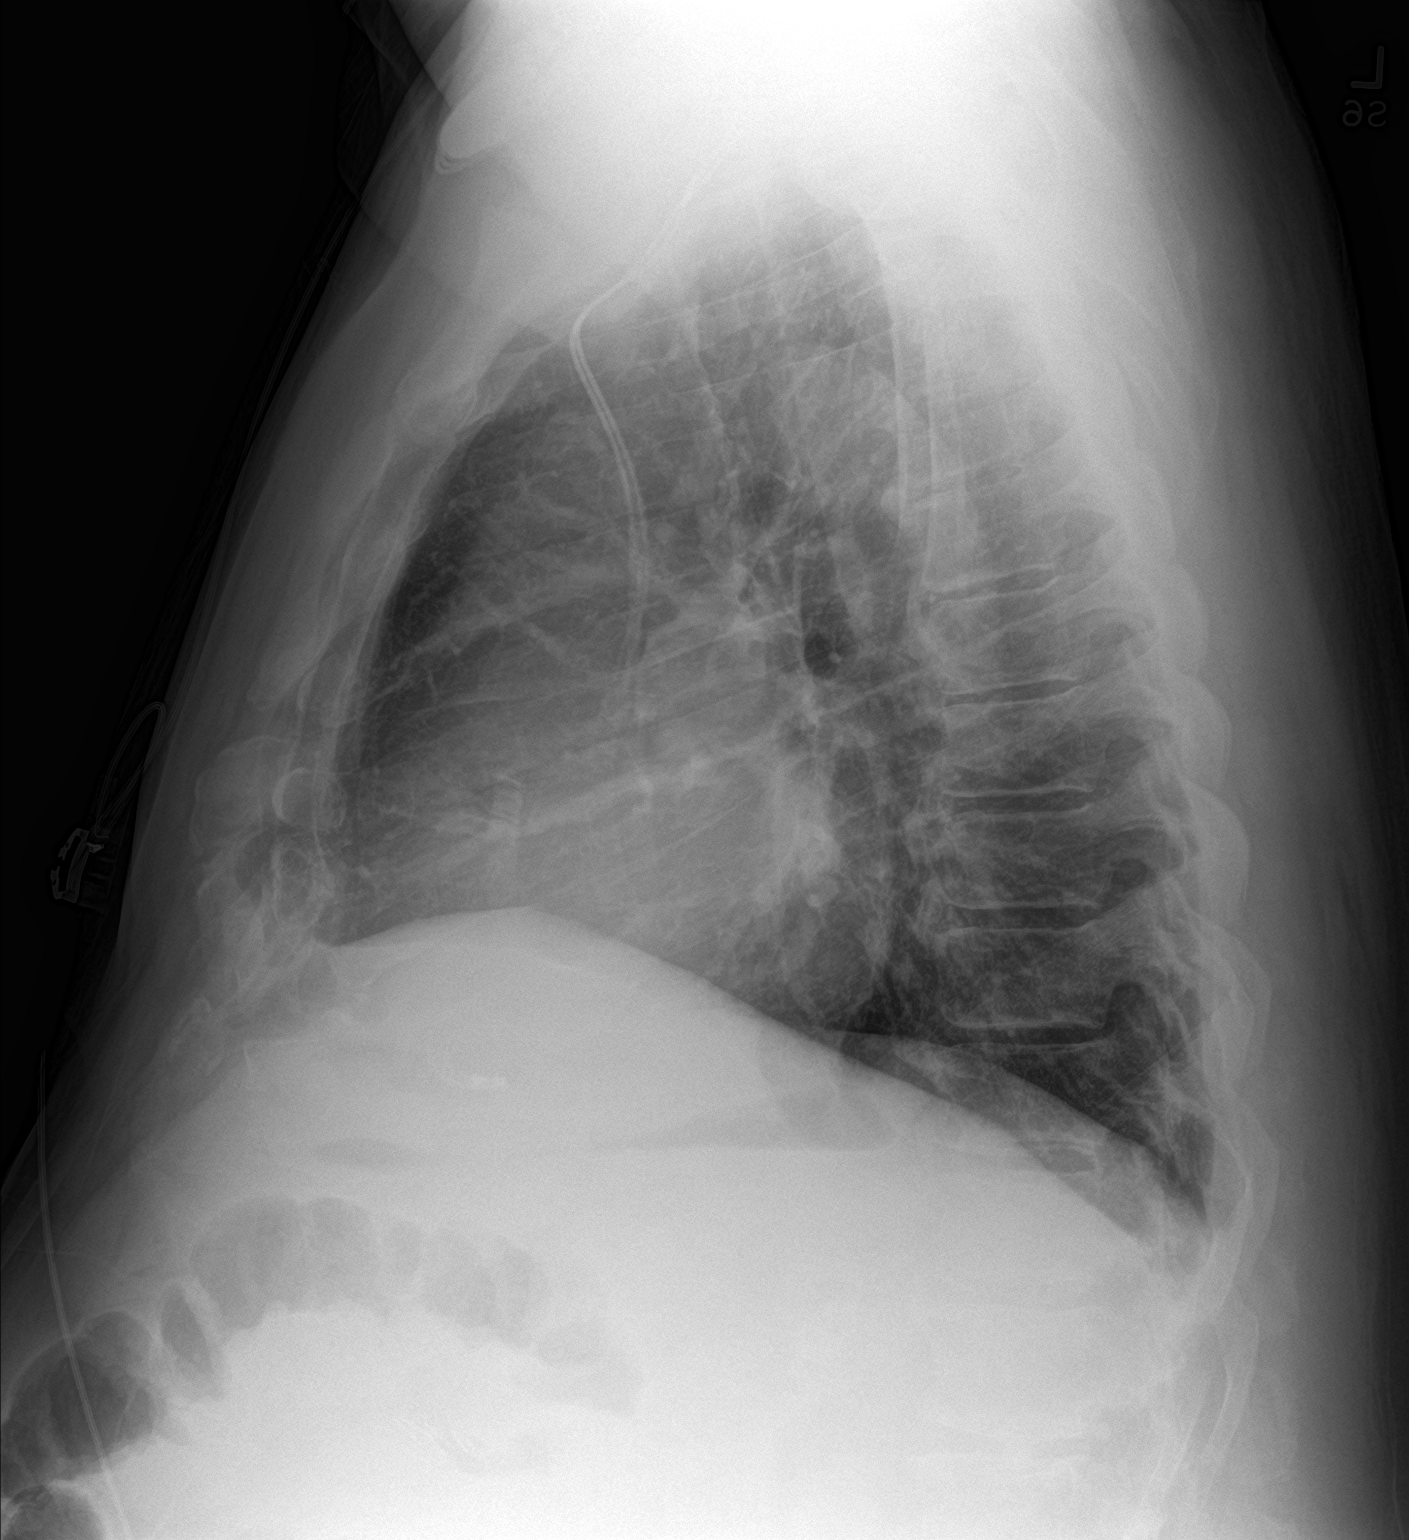

[2 of 2 positions shown; findings below may reference images not displayed]

FINDINGS: The heart size and mediastinal contours are within normal limits.
Both lungs are clear. No pneumothorax or pleural effusion is noted.
Atherosclerosis of thoracic aorta is noted. Left-sided pacemaker is
unchanged in position. The visualized skeletal structures are
unremarkable.
IMPRESSION: No active cardiopulmonary disease.  Aortic atherosclerosis.

## 2017-11-22 IMAGING — CR DG CHEST 1V PORT
1 series · 1 of 1 positions shown · non-contrast
Comparison: 09/15/2016

CLINICAL DATA: Chest tube.  CABG.

EXAM:
PORTABLE CHEST 1 VIEW

[AP]
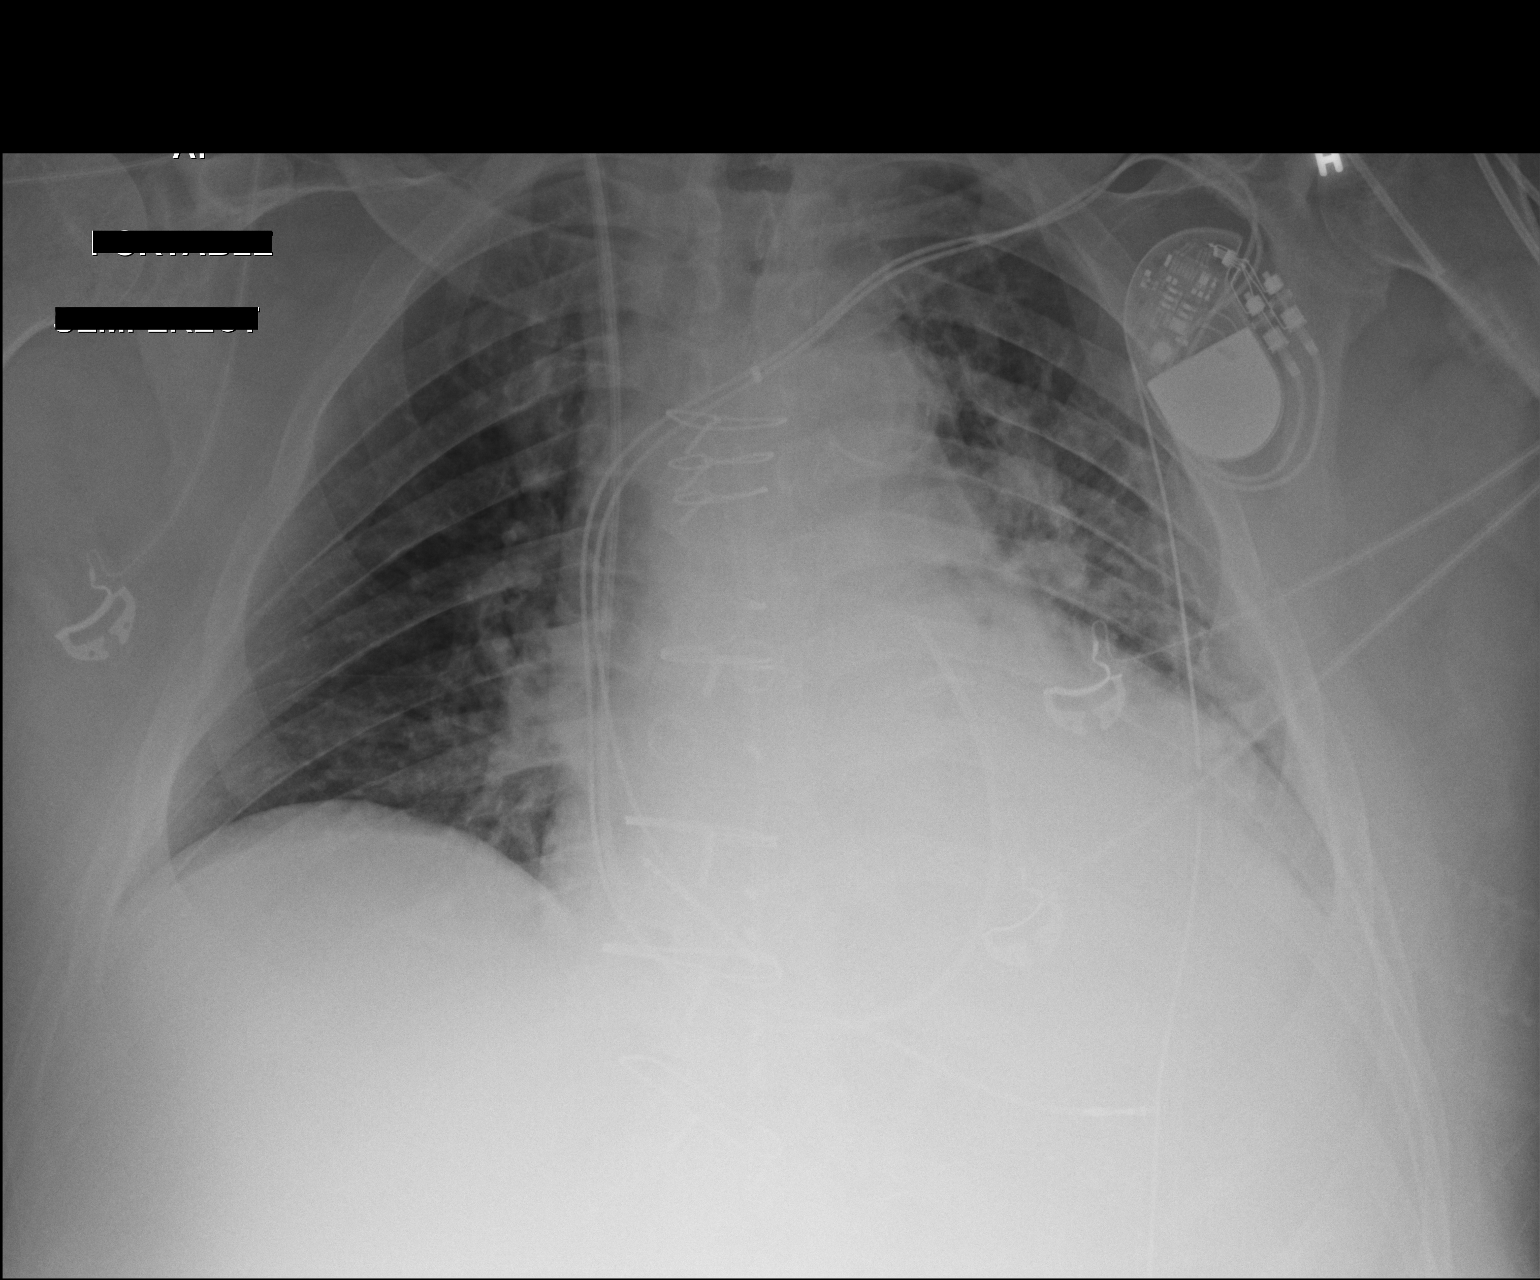

[1 of 1 positions shown; findings below may reference images not displayed]

FINDINGS: Interim extubation and removal of NG tube. Left chest tube in stable
position. Swan-Ganz catheter stable position . Cardiac pacer stable
position. Prior CABG. Cardiomegaly with pulmonary vascular
prominence. Persistent left lower lobe atelectasis kidney Small left
pleural effusion.
IMPRESSION: 1. Interim extubation and removal of NG tube. Remaining lines and
tubes including left chest tube in stable position. No pneumothorax.

2. Cardiac pacer stable position. Prior CABG. Ardiomegaly with mild
pulmonary venous congestion.

3.  Persistent left lower lobe atelectasis.

## 2017-11-24 ENCOUNTER — Telehealth: Payer: Self-pay | Admitting: Endocrinology

## 2017-11-24 NOTE — Telephone Encounter (Signed)
Patient notified of message and voiced understanding. He will call back to report.

## 2017-11-24 NOTE — Telephone Encounter (Signed)
He will have to check with his insurance.  He can also check on the coverage for Invokana tablets

## 2017-11-24 NOTE — Telephone Encounter (Signed)
Is there another alternative for victoza, it is to expensive. Please advise

## 2017-11-28 DIAGNOSIS — I2510 Atherosclerotic heart disease of native coronary artery without angina pectoris: Secondary | ICD-10-CM | POA: Diagnosis not present

## 2017-11-28 DIAGNOSIS — M170 Bilateral primary osteoarthritis of knee: Secondary | ICD-10-CM | POA: Diagnosis not present

## 2017-11-28 DIAGNOSIS — Z7901 Long term (current) use of anticoagulants: Secondary | ICD-10-CM | POA: Diagnosis not present

## 2017-11-28 DIAGNOSIS — Z9181 History of falling: Secondary | ICD-10-CM | POA: Diagnosis not present

## 2017-11-28 DIAGNOSIS — Z1331 Encounter for screening for depression: Secondary | ICD-10-CM | POA: Diagnosis not present

## 2017-11-28 DIAGNOSIS — I4891 Unspecified atrial fibrillation: Secondary | ICD-10-CM | POA: Diagnosis not present

## 2017-11-28 DIAGNOSIS — Z79899 Other long term (current) drug therapy: Secondary | ICD-10-CM | POA: Diagnosis not present

## 2017-11-28 DIAGNOSIS — E119 Type 2 diabetes mellitus without complications: Secondary | ICD-10-CM | POA: Diagnosis not present

## 2017-11-30 NOTE — Telephone Encounter (Signed)
°  Pt called [employer] and they said the equivalent medication is  xultoph  Pt wants to know if he can start the script.  Please advise  [place], [place]

## 2017-12-02 ENCOUNTER — Ambulatory Visit (INDEPENDENT_AMBULATORY_CARE_PROVIDER_SITE_OTHER): Payer: BLUE CROSS/BLUE SHIELD | Admitting: Cardiovascular Disease

## 2017-12-02 ENCOUNTER — Encounter: Payer: Self-pay | Admitting: Cardiovascular Disease

## 2017-12-02 VITALS — BP 126/80 | HR 70 | Ht 68.0 in | Wt 256.0 lb

## 2017-12-02 DIAGNOSIS — R600 Localized edema: Secondary | ICD-10-CM

## 2017-12-02 DIAGNOSIS — Z9989 Dependence on other enabling machines and devices: Secondary | ICD-10-CM | POA: Diagnosis not present

## 2017-12-02 DIAGNOSIS — Z794 Long term (current) use of insulin: Secondary | ICD-10-CM | POA: Diagnosis not present

## 2017-12-02 DIAGNOSIS — I495 Sick sinus syndrome: Secondary | ICD-10-CM | POA: Diagnosis not present

## 2017-12-02 DIAGNOSIS — Z7901 Long term (current) use of anticoagulants: Secondary | ICD-10-CM | POA: Diagnosis not present

## 2017-12-02 DIAGNOSIS — E1159 Type 2 diabetes mellitus with other circulatory complications: Secondary | ICD-10-CM | POA: Diagnosis not present

## 2017-12-02 DIAGNOSIS — E785 Hyperlipidemia, unspecified: Secondary | ICD-10-CM

## 2017-12-02 DIAGNOSIS — G4733 Obstructive sleep apnea (adult) (pediatric): Secondary | ICD-10-CM | POA: Diagnosis not present

## 2017-12-02 DIAGNOSIS — Z951 Presence of aortocoronary bypass graft: Secondary | ICD-10-CM

## 2017-12-02 DIAGNOSIS — I480 Paroxysmal atrial fibrillation: Secondary | ICD-10-CM
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
## 2017-12-02 NOTE — Patient Instructions (Signed)
Medication Instructions:  Try to increase the frequency of your Crestor ---try to take every other day with the goal of taking 5 mg daily  Follow-Up: Your physician wants you to follow-up in: 6 months with Dr. Claiborne Billings. You will receive a reminder letter in the mail two months in advance. If you don't receive a letter, please call our office to schedule the follow-up appointment.   Any Other Special Instructions Will Be Listed Below (If Applicable).     If you need a refill on your cardiac medications before your next appointment, please call your pharmacy.

## 2017-12-03 ENCOUNTER — Encounter: Payer: Self-pay | Admitting: Cardiovascular Disease

## 2017-12-06 ENCOUNTER — Other Ambulatory Visit (INDEPENDENT_AMBULATORY_CARE_PROVIDER_SITE_OTHER): Payer: BLUE CROSS/BLUE SHIELD

## 2017-12-06 DIAGNOSIS — Z794 Long term (current) use of insulin: Secondary | ICD-10-CM | POA: Diagnosis not present

## 2017-12-06 DIAGNOSIS — E1165 Type 2 diabetes mellitus with hyperglycemia: Secondary | ICD-10-CM | POA: Diagnosis not present

## 2017-12-06 LAB — COMPREHENSIVE METABOLIC PANEL
ALBUMIN: 4 g/dL (ref 3.5–5.2)
ALK PHOS: 55 U/L (ref 39–117)
ALT: 13 U/L (ref 0–53)
AST: 11 U/L (ref 0–37)
BILIRUBIN TOTAL: 0.4 mg/dL (ref 0.2–1.2)
BUN: 23 mg/dL (ref 6–23)
CALCIUM: 9.1 mg/dL (ref 8.4–10.5)
CO2: 28 meq/L (ref 19–32)
Chloride: 105 mEq/L (ref 96–112)
Creatinine, Ser: 1.36 mg/dL (ref 0.40–1.50)
GFR: 54.91 mL/min — ABNORMAL LOW (ref >60.00)
Glucose, Bld: 156 mg/dL — ABNORMAL HIGH (ref 70–99)
Potassium: 5.1 mEq/L (ref 3.5–5.1)
Sodium: 140 mEq/L (ref 135–145)
TOTAL PROTEIN: 7.1 g/dL (ref 6.0–8.3)

## 2017-12-06 LAB — MICROALBUMIN / CREATININE URINE RATIO
Creatinine,U: 161.3 mg/dL
Microalb Creat Ratio: 8.8 mg/g (ref 0.0–30.0)
Microalb, Ur: 14.2 mg/dL — ABNORMAL HIGH (ref 0.0–1.9)

## 2017-12-06 LAB — HEMOGLOBIN A1C: HEMOGLOBIN A1C: 6.9 % — AB (ref 4.6–6.5)

## 2017-12-07 NOTE — Telephone Encounter (Signed)
I will have to discuss the new medication option with him on his office visit as it is not the same

## 2017-12-09 ENCOUNTER — Ambulatory Visit (INDEPENDENT_AMBULATORY_CARE_PROVIDER_SITE_OTHER): Payer: BLUE CROSS/BLUE SHIELD | Admitting: Endocrinology

## 2017-12-09 ENCOUNTER — Encounter: Payer: Self-pay | Admitting: Endocrinology

## 2017-12-09 ENCOUNTER — Telehealth: Payer: Self-pay | Admitting: Cardiovascular Disease

## 2017-12-09 VITALS — BP 134/80 | HR 74 | Ht 68.0 in | Wt 299.0 lb

## 2017-12-09 DIAGNOSIS — Z794 Long term (current) use of insulin: Secondary | ICD-10-CM

## 2017-12-09 DIAGNOSIS — E1165 Type 2 diabetes mellitus with hyperglycemia: Secondary | ICD-10-CM | POA: Diagnosis not present

## 2017-12-09 MED ORDER — ROSUVASTATIN CALCIUM 5 MG PO TABS: 5.0000 mg | ORAL_TABLET | ORAL | 11 refills | Status: DC

## 2017-12-09 MED ORDER — INSULIN DEGLUDEC-LIRAGLUTIDE 100-3.6 UNIT-MG/ML ~~LOC~~ SOPN: 40.0000 [IU] | PEN_INJECTOR | Freq: Every day | SUBCUTANEOUS | 2 refills | Status: DC
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
## 2017-12-09 NOTE — Patient Instructions (Addendum)
More sugars AFTER DINNER  With starting Xultophy please check blood sugar every morning  Starting doses: XULTOPHY = 16 units and reduce TRESIBA down to 40 units You can take the injections together once a day at the same time  On a daily basis increase the Xultophy by 2 units and at the same time reduce Tresiba by 2 units Continue to workup on the Xultophy dose to the maximally tolerated dose, back down 2-4 units if you start having persistent nausea  At the same time continued to reduce the [place] by the same number of units as the increase in [place]

## 2017-12-09 NOTE — Telephone Encounter (Signed)
New message     *STAT* If patient is at the pharmacy, call can be transferred to refill team.   1. Which medications need to be refilled? (please list name of each medication and dose if known) rosuvastatin (CRESTOR) 5 MG tablet  2. Which pharmacy/location (including street and city if local pharmacy) is medication to be sent to? [place] [phone]  3. Do they need a 30 day or 90 day supply? [place]

## 2017-12-09 NOTE — Telephone Encounter (Signed)
Pt's medication was sent to pt's pharmacy as requested. Confirmation received.  °

## 2017-12-12 ENCOUNTER — Other Ambulatory Visit: Payer: Self-pay

## 2017-12-12 MED ORDER — INSULIN DEGLUDEC-LIRAGLUTIDE 100-3.6 UNIT-MG/ML ~~LOC~~ SOPN: 40.0000 [IU] | PEN_INJECTOR | Freq: Every day | SUBCUTANEOUS | 2 refills | Status: DC

## 2017-12-12 NOTE — Telephone Encounter (Signed)
Patient stated that his medication Insulin Degludec-Liraglutide (XULTOPHY) 100-3.6 UNIT-MG/ML SOPN [[id number] has being sent to the wrong [facility]) [facility] no longer use [facility],  Please send to [place], [place]  Patient stated medication XULTOPHY need a PA

## 2017-12-12 NOTE — Telephone Encounter (Signed)
This has been done.

## 2017-12-13 ENCOUNTER — Telehealth: Payer: Self-pay | Admitting: Endocrinology

## 2017-12-13 ENCOUNTER — Telehealth: Payer: Self-pay | Admitting: Cardiovascular Disease

## 2017-12-13 NOTE — Telephone Encounter (Signed)
Returned call to patient.  Patient reports not feeling well yesterday.  States he woke up this AM and still does not feel well, reports nausea and feeling "woozy".  Also reports waking up this AM with a bruise on the left side of his chest, between his collarbone and neck and is unsure where this came from.   Denies SOB, CP.   Reports BP this AM 172/99 before medication taken.   After medication BP is now 155/84 HR 69.   Patient also reports recent change in diabetic medications. Reports he was taking Victoza but is not currently on this as there was some mix up with his insurance/prescription.   CBG 199.   Also has been trying to increase the frequency of taking his Crestor (as instructed by Dr. Claiborne Billings as OV 1/11).     Denies sick contacts.     Advised to continue to monitor BP and call if BP consistently elevated or symptoms increase/change.  Also advised to contact PCP to discuss other symptoms.   Patient agreed with plan and verbalized understanding.

## 2017-12-13 NOTE — Telephone Encounter (Signed)
Patient's script for new medication=Xultophy went to wrong pharmacy [facility] sent it back). Pharmacy is [facility] in [place] ph# [phone]. Please send script to [facility] and delete [place]. Patient has question re: Have we heard anything back from [employer]) with pre-authorization for new medication? Please advise patient.

## 2017-12-13 NOTE — Telephone Encounter (Signed)
New message   Patient calling not feeling well today nor yesterday.    No chest pain ,no sob  S/p bypass surgery 14 months ago  bruise above collarbone left side of neck   Pt C/o BP issue: STAT if pt C/O blurred vision, one-sided weakness or slurred speech  1. What are your last 5 BP readings? 172/99 about 15 min ago , DM reading 199   2. Are you having any other symptoms (ex. Dizziness, headache, blurred vision, passed out)? lightheadedness this am   3. What is your BP issue? Wants to discuss with nurse.

## 2017-12-14 NOTE — Telephone Encounter (Signed)
Patient is calling on the status of medication Xultophy,to see if it's been aproved been. Please advise

## 2017-12-15 NOTE — Telephone Encounter (Signed)
Patient is returning lisa call

## 2017-12-15 NOTE — Telephone Encounter (Signed)
Pt is calling on the status of medication Xultophy. To see if its been approved/   Please advise

## 2017-12-20 ENCOUNTER — Telehealth: Payer: Self-pay | Admitting: Endocrinology

## 2017-12-20 NOTE — Telephone Encounter (Signed)
[employer] called ph# [phone] re: status of prior authorization on form sent by [place] faxed to Dr Dwyane Dee on 12/14/17 Prior authorization reference number: [id number]

## 2017-12-21 NOTE — Telephone Encounter (Signed)
Pt is calling back to see if Charles [name] has been approved or not by his insurance.  Please advise.

## 2017-12-22 ENCOUNTER — Telehealth: Payer: Self-pay | Admitting: Cardiovascular Disease

## 2017-12-22 MED ORDER — ROSUVASTATIN CALCIUM 5 MG PO TABS: 7.5000 mg | ORAL_TABLET | Freq: Every day | ORAL | 3 refills | Status: DC

## 2017-12-22 NOTE — Telephone Encounter (Signed)
Pt c/o medication issue:  1. Name of Medication: Rosuvastatin 5 mg  2. How are you currently taking this medication (dosage and times per day)? 3x a week   3. Are you having a reaction (difficulty breathing--STAT)?   4. What is your medication issue? Patient cannot get medication refilled because insurance states that it is too early for refill. Patient states that Dr. Claiborne Billings increased his dosage and patient needs a new prescription reflecting the changes.

## 2017-12-22 NOTE — Telephone Encounter (Signed)
Spoke with pt, he has gradually increasing the rosuvastatin as dr Claiborne Billings instructed. He is now up to 7.5 mg once daily. New script sent to the pharmacy.

## 2017-12-22 NOTE — Telephone Encounter (Signed)
Pt is calling to check on status of authorization for new medication Xultophy Please advise

## 2017-12-22 NOTE — Telephone Encounter (Signed)
Form on dr. Jodelle Green desk awaiting signature

## 2017-12-23 NOTE — Telephone Encounter (Signed)
Cover my meds is needing prior auth. They are following up on status. They have sent over a paper to our office.  Please advise  [phone]

## 2017-12-26 ENCOUNTER — Other Ambulatory Visit: Payer: Self-pay

## 2017-12-26 MED ORDER — INSULIN DEGLUDEC-LIRAGLUTIDE 100-3.6 UNIT-MG/ML ~~LOC~~ SOPN: 40.0000 [IU] | PEN_INJECTOR | Freq: Every day | SUBCUTANEOUS | 2 refills | Status: DC

## 2017-12-26 NOTE — Telephone Encounter (Signed)
Pt called and stated the meds are still not covered. Has been going back and forth trying to medication approved. Pt states it is over 1000 if insurance does not cover  Please advise

## 2017-12-26 NOTE — Telephone Encounter (Signed)
Ellard Artis from [facility] need a PA for Xultophy, there is no generic brand for this medication.  [place], [place] [phone] (Phone) [phone] (Fax)
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
## 2017-12-28 ENCOUNTER — Telehealth: Payer: Self-pay | Admitting: Endocrinology

## 2017-12-28 ENCOUNTER — Other Ambulatory Visit: Payer: Self-pay

## 2017-12-28 ENCOUNTER — Ambulatory Visit (INDEPENDENT_AMBULATORY_CARE_PROVIDER_SITE_OTHER): Payer: BLUE CROSS/BLUE SHIELD | Admitting: Nurse Practitioner

## 2017-12-28 ENCOUNTER — Encounter: Payer: Self-pay | Admitting: Nurse Practitioner

## 2017-12-28 VITALS — BP 130/78 | HR 70 | Ht 68.0 in | Wt 294.2 lb

## 2017-12-28 DIAGNOSIS — I259 Chronic ischemic heart disease, unspecified: Secondary | ICD-10-CM

## 2017-12-28 DIAGNOSIS — I480 Paroxysmal atrial fibrillation: Secondary | ICD-10-CM

## 2017-12-28 DIAGNOSIS — G4733 Obstructive sleep apnea (adult) (pediatric): Secondary | ICD-10-CM

## 2017-12-28 DIAGNOSIS — Z9989 Dependence on other enabling machines and devices: Secondary | ICD-10-CM

## 2017-12-28 DIAGNOSIS — I495 Sick sinus syndrome: Secondary | ICD-10-CM | POA: Diagnosis not present

## 2017-12-28 LAB — CUP PACEART INCLINIC DEVICE CHECK
Date Time Interrogation Session: 20190206111134
Implantable Lead Implant Date: 20091007
Implantable Pulse Generator Implant Date: 20091007
MDC IDC LEAD IMPLANT DT: 20091007
MDC IDC LEAD LOCATION: 753859
MDC IDC LEAD LOCATION: 753860
Pulse Gen Serial Number: 1266370

## 2017-12-28 NOTE — Telephone Encounter (Signed)
[employer] insurance is calling on behalf of the pt,  States [employer] has not received a PA request. This is a PA for Xultophy They are needing a new request sent in.     This is the rep from [employer] if we have any futher questions. Bridgett king [phone]

## 2017-12-28 NOTE — Telephone Encounter (Signed)
[facility] and he stated that he needs his [place] PA started. I will call his insurance as soon as I can.

## 2017-12-28 NOTE — Patient Instructions (Signed)
Medication Instructions:   Your physician recommends that you continue on your current medications as directed. Please refer to the Current Medication list given to you today.   If you need a refill on your cardiac medications before your next appointment, please call your pharmacy.  Labwork: NONE ORDERED  TODAY    Testing/Procedures: NONE ORDERED  TODAY    Follow-Up:  Your physician wants you to follow-up in:  [place] will receive a reminder letter in the mail two months in advance. If you don't receive a letter, please call our office to schedule the follow-up appointment.   Your physician wants you to follow-up in: [place] will receive a reminder letter in the mail two months in advance. If you don't receive a letter, please call our office to schedule the follow-up appointment.     Any Other Special Instructions Will Be Listed Below (If Applicable).

## 2017-12-28 NOTE — Telephone Encounter (Signed)
Called [employer] prior authorization department and spoke to [employer] and she stated that the PA for Charles [name] has been approved through 12/25/2018 and the Authorization Key is # [id number].  Called patient and let him know that this was approved and he stated that [place] stated that the Xultophy will be $900 per month and he will not be able to afford this.  Any suggestions? Please advise.

## 2017-12-28 NOTE — Telephone Encounter (Signed)
He himself had verified cost of various drugs and asked for Xultophy as the least expensive.  If he cannot afford Victoza he will have to just take 2 types of insulin as before

## 2017-12-29 NOTE — Telephone Encounter (Signed)
The PA has been completed. Please see previous notes.

## 2017-12-30 ENCOUNTER — Other Ambulatory Visit: Payer: Self-pay

## 2017-12-30 MED ORDER — "INSULIN SYRINGE-NEEDLE U-100 30G X 1/2"" 0.5 ML MISC": 2 refills | Status: DC

## 2017-12-30 NOTE — Telephone Encounter (Signed)
Pt is aware.  

## 2017-12-30 NOTE — Telephone Encounter (Signed)
He is supposed to take 56 units of Tresiba and NovoLog before each meal as before

## 2017-12-30 NOTE — Telephone Encounter (Signed)
See 12/28/17 TC

## 2017-12-30 NOTE — Telephone Encounter (Signed)
Pt is taking Novolog and [place], does he need to make any changes since he can not get Victoza or Xultophy

## 2018-01-02 ENCOUNTER — Other Ambulatory Visit: Payer: Self-pay | Admitting: *Deleted

## 2018-01-02 MED ORDER — SOTALOL HCL 120 MG PO TABS: 120.0000 mg | ORAL_TABLET | Freq: Two times a day (BID) | ORAL | 3 refills | Status: DC

## 2018-01-05 DIAGNOSIS — D631 Anemia in chronic kidney disease: Secondary | ICD-10-CM | POA: Diagnosis not present

## 2018-01-05 DIAGNOSIS — N183 Chronic kidney disease, stage 3 (moderate): Secondary | ICD-10-CM | POA: Diagnosis not present

## 2018-01-13 ENCOUNTER — Telehealth: Payer: Self-pay | Admitting: Cardiovascular Disease

## 2018-01-13 DIAGNOSIS — S00411A Abrasion of right ear, initial encounter: Secondary | ICD-10-CM | POA: Diagnosis not present

## 2018-01-13 DIAGNOSIS — E119 Type 2 diabetes mellitus without complications: Secondary | ICD-10-CM | POA: Diagnosis not present

## 2018-01-13 DIAGNOSIS — Z7982 Long term (current) use of aspirin: Secondary | ICD-10-CM | POA: Diagnosis not present

## 2018-01-13 DIAGNOSIS — I252 Old myocardial infarction: Secondary | ICD-10-CM | POA: Diagnosis not present

## 2018-01-13 DIAGNOSIS — S0003XA Contusion of scalp, initial encounter: Secondary | ICD-10-CM | POA: Diagnosis not present

## 2018-01-13 DIAGNOSIS — Z87891 Personal history of nicotine dependence: Secondary | ICD-10-CM | POA: Diagnosis not present

## 2018-01-13 DIAGNOSIS — S0990XA Unspecified injury of head, initial encounter: Secondary | ICD-10-CM | POA: Diagnosis not present

## 2018-01-13 DIAGNOSIS — S4991XA Unspecified injury of right shoulder and upper arm, initial encounter: Secondary | ICD-10-CM | POA: Diagnosis not present

## 2018-01-13 DIAGNOSIS — M1711 Unilateral primary osteoarthritis, right knee: Secondary | ICD-10-CM | POA: Diagnosis not present

## 2018-01-13 DIAGNOSIS — R51 Headache: Secondary | ICD-10-CM | POA: Diagnosis not present

## 2018-01-13 DIAGNOSIS — Z950 Presence of cardiac pacemaker: Secondary | ICD-10-CM | POA: Diagnosis not present

## 2018-01-13 DIAGNOSIS — I2510 Atherosclerotic heart disease of native coronary artery without angina pectoris: Secondary | ICD-10-CM | POA: Diagnosis not present

## 2018-01-13 DIAGNOSIS — M25561 Pain in right knee: Secondary | ICD-10-CM | POA: Diagnosis not present

## 2018-01-13 DIAGNOSIS — M542 Cervicalgia: Secondary | ICD-10-CM | POA: Diagnosis not present

## 2018-01-13 DIAGNOSIS — Z9049 Acquired absence of other specified parts of digestive tract: Secondary | ICD-10-CM | POA: Diagnosis not present

## 2018-01-13 DIAGNOSIS — I10 Essential (primary) hypertension: Secondary | ICD-10-CM | POA: Diagnosis not present

## 2018-01-13 DIAGNOSIS — M25511 Pain in right shoulder: Secondary | ICD-10-CM | POA: Diagnosis not present

## 2018-01-13 DIAGNOSIS — Z955 Presence of coronary angioplasty implant and graft: Secondary | ICD-10-CM | POA: Diagnosis not present

## 2018-01-13 DIAGNOSIS — I4891 Unspecified atrial fibrillation: Secondary | ICD-10-CM | POA: Diagnosis not present

## 2018-01-13 DIAGNOSIS — M5030 Other cervical disc degeneration, unspecified cervical region: Secondary | ICD-10-CM | POA: Diagnosis not present

## 2018-01-13 NOTE — Telephone Encounter (Signed)
New message    Patient fell down steps and hit his head.No skin was broken, some swelling. No external bleeding. Right shoulder pain.  Patient concerned about internal bleeding while on blood thinners. Please call

## 2018-01-13 NOTE — Telephone Encounter (Signed)
He fell about 15 minutes ago and his his head on a door jam. He reports he is not sleepy, not disoriented. Explained that although he has no external bleeding, he should be evaluated as he is on warfarin. He states he will go to [facility] (closest) for evaluation.

## 2018-01-17 ENCOUNTER — Ambulatory Visit: Payer: BLUE CROSS/BLUE SHIELD | Admitting: Endocrinology

## 2018-01-17 ENCOUNTER — Other Ambulatory Visit: Payer: BLUE CROSS/BLUE SHIELD

## 2018-01-20 ENCOUNTER — Ambulatory Visit: Payer: BLUE CROSS/BLUE SHIELD | Admitting: Endocrinology

## 2018-01-20 DIAGNOSIS — S4991XA Unspecified injury of right shoulder and upper arm, initial encounter: Secondary | ICD-10-CM | POA: Diagnosis not present

## 2018-01-20 DIAGNOSIS — Z6841 Body Mass Index (BMI) 40.0 and over, adult: Secondary | ICD-10-CM | POA: Diagnosis not present

## 2018-01-20 DIAGNOSIS — R791 Abnormal coagulation profile: Secondary | ICD-10-CM | POA: Diagnosis not present

## 2018-01-20 DIAGNOSIS — S0990XA Unspecified injury of head, initial encounter: Secondary | ICD-10-CM | POA: Diagnosis not present

## 2018-01-20 DIAGNOSIS — Z7901 Long term (current) use of anticoagulants: Secondary | ICD-10-CM | POA: Diagnosis not present

## 2018-01-23 DIAGNOSIS — R791 Abnormal coagulation profile: Secondary | ICD-10-CM | POA: Diagnosis not present

## 2018-01-31 DIAGNOSIS — Z7901 Long term (current) use of anticoagulants: Secondary | ICD-10-CM | POA: Diagnosis not present

## 2018-02-03 DIAGNOSIS — E119 Type 2 diabetes mellitus without complications: Secondary | ICD-10-CM | POA: Diagnosis not present

## 2018-02-03 DIAGNOSIS — H52203 Unspecified astigmatism, bilateral: Secondary | ICD-10-CM | POA: Diagnosis not present

## 2018-02-03 DIAGNOSIS — H0100A Unspecified blepharitis right eye, upper and lower eyelids: Secondary | ICD-10-CM | POA: Diagnosis not present

## 2018-02-03 DIAGNOSIS — H25813 Combined forms of age-related cataract, bilateral: Secondary | ICD-10-CM | POA: Diagnosis not present

## 2018-02-03 LAB — HM DIABETES EYE EXAM

## 2018-02-10 ENCOUNTER — Encounter: Payer: Self-pay | Admitting: *Deleted

## 2018-02-14 ENCOUNTER — Other Ambulatory Visit (INDEPENDENT_AMBULATORY_CARE_PROVIDER_SITE_OTHER): Payer: BLUE CROSS/BLUE SHIELD

## 2018-02-14 DIAGNOSIS — E1165 Type 2 diabetes mellitus with hyperglycemia: Secondary | ICD-10-CM | POA: Diagnosis not present

## 2018-02-14 DIAGNOSIS — Z794 Long term (current) use of insulin: Secondary | ICD-10-CM | POA: Diagnosis not present

## 2018-02-14 DIAGNOSIS — Z7901 Long term (current) use of anticoagulants: Secondary | ICD-10-CM | POA: Diagnosis not present

## 2018-02-14 LAB — BASIC METABOLIC PANEL
BUN: 24 mg/dL — AB (ref 6–23)
CALCIUM: 8.9 mg/dL (ref 8.4–10.5)
CO2: 27 meq/L (ref 19–32)
CREATININE: 1.23 mg/dL (ref 0.40–1.50)
Chloride: 105 mEq/L (ref 96–112)
GFR: 61.63 mL/min (ref >60.00)
GLUCOSE: 214 mg/dL — AB (ref 70–99)
Potassium: 5.2 mEq/L — ABNORMAL HIGH (ref 3.5–5.1)
Sodium: 137 mEq/L (ref 135–145)

## 2018-02-15 LAB — FRUCTOSAMINE: Fructosamine: 301 umol/L — ABNORMAL HIGH (ref 0–285)
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
## 2018-02-16 ENCOUNTER — Encounter: Payer: Self-pay | Admitting: Endocrinology

## 2018-02-16 ENCOUNTER — Ambulatory Visit (INDEPENDENT_AMBULATORY_CARE_PROVIDER_SITE_OTHER): Payer: BLUE CROSS/BLUE SHIELD | Admitting: Endocrinology

## 2018-02-16 VITALS — BP 140/84 | HR 73 | Ht 68.0 in | Wt 292.0 lb

## 2018-02-16 DIAGNOSIS — Z794 Long term (current) use of insulin: Secondary | ICD-10-CM | POA: Diagnosis not present

## 2018-02-16 DIAGNOSIS — E1165 Type 2 diabetes mellitus with hyperglycemia: Secondary | ICD-10-CM | POA: Diagnosis not present

## 2018-02-16 DIAGNOSIS — E875 Hyperkalemia: Secondary | ICD-10-CM | POA: Diagnosis not present

## 2018-02-16 DIAGNOSIS — E782 Mixed hyperlipidemia: Secondary | ICD-10-CM | POA: Diagnosis not present

## 2018-02-16 MED ORDER — DAPAGLIFLOZIN PROPANEDIOL 5 MG PO TABS: 5.0000 mg | ORAL_TABLET | Freq: Every day | ORAL | 2 refills | Status: DC

## 2018-02-16 NOTE — Patient Instructions (Addendum)
Tresiba 42 and bring am sugar <130  Novolog 18-22 in and 5 more at other meals  With  Farxiga no Losartan and lasix unless swelling  Check sugar 2-3x per day

## 2018-02-23 ENCOUNTER — Other Ambulatory Visit: Payer: Self-pay | Admitting: *Deleted

## 2018-02-23 MED ORDER — INSULIN ASPART 100 UNIT/ML ~~LOC~~ SOLN: SUBCUTANEOUS | 11 refills | Status: DC

## 2018-02-23 MED ORDER — GABAPENTIN 300 MG PO CAPS: 300.0000 mg | ORAL_CAPSULE | Freq: Three times a day (TID) | ORAL | 3 refills | Status: DC

## 2018-02-23 NOTE — Addendum Note (Signed)
Addended by: Cresenciano Lick on: 02/23/2018 11:03 AM   Modules accepted: Orders

## 2018-02-27 DIAGNOSIS — G4730 Sleep apnea, unspecified: Secondary | ICD-10-CM | POA: Diagnosis not present

## 2018-02-27 DIAGNOSIS — I4891 Unspecified atrial fibrillation: Secondary | ICD-10-CM | POA: Diagnosis not present

## 2018-02-27 DIAGNOSIS — M170 Bilateral primary osteoarthritis of knee: Secondary | ICD-10-CM | POA: Diagnosis not present

## 2018-02-27 DIAGNOSIS — E119 Type 2 diabetes mellitus without complications: Secondary | ICD-10-CM | POA: Diagnosis not present

## 2018-03-05 DIAGNOSIS — I10 Essential (primary) hypertension: Secondary | ICD-10-CM | POA: Diagnosis not present

## 2018-03-05 DIAGNOSIS — Z87891 Personal history of nicotine dependence: Secondary | ICD-10-CM | POA: Diagnosis not present

## 2018-03-05 DIAGNOSIS — M5416 Radiculopathy, lumbar region: Secondary | ICD-10-CM | POA: Diagnosis not present

## 2018-03-05 DIAGNOSIS — Z950 Presence of cardiac pacemaker: Secondary | ICD-10-CM | POA: Diagnosis not present

## 2018-03-05 DIAGNOSIS — Z955 Presence of coronary angioplasty implant and graft: Secondary | ICD-10-CM | POA: Diagnosis not present

## 2018-03-05 DIAGNOSIS — I252 Old myocardial infarction: Secondary | ICD-10-CM | POA: Diagnosis not present

## 2018-03-05 DIAGNOSIS — E119 Type 2 diabetes mellitus without complications: Secondary | ICD-10-CM | POA: Diagnosis not present

## 2018-03-05 DIAGNOSIS — I4891 Unspecified atrial fibrillation: Secondary | ICD-10-CM | POA: Diagnosis not present

## 2018-03-05 DIAGNOSIS — M48061 Spinal stenosis, lumbar region without neurogenic claudication: Secondary | ICD-10-CM | POA: Diagnosis not present

## 2018-03-05 DIAGNOSIS — M25562 Pain in left knee: Secondary | ICD-10-CM | POA: Diagnosis not present

## 2018-03-05 DIAGNOSIS — I2510 Atherosclerotic heart disease of native coronary artery without angina pectoris: Secondary | ICD-10-CM | POA: Diagnosis not present

## 2018-03-05 DIAGNOSIS — Z885 Allergy status to narcotic agent status: Secondary | ICD-10-CM | POA: Diagnosis not present

## 2018-03-05 DIAGNOSIS — M25561 Pain in right knee: Secondary | ICD-10-CM | POA: Diagnosis not present

## 2018-03-05 DIAGNOSIS — M5116 Intervertebral disc disorders with radiculopathy, lumbar region: Secondary | ICD-10-CM | POA: Diagnosis not present

## 2018-03-05 DIAGNOSIS — M5117 Intervertebral disc disorders with radiculopathy, lumbosacral region: Secondary | ICD-10-CM | POA: Diagnosis not present

## 2018-03-05 DIAGNOSIS — M545 Low back pain: Secondary | ICD-10-CM | POA: Diagnosis not present

## 2018-03-06 ENCOUNTER — Other Ambulatory Visit: Payer: Self-pay

## 2018-03-06 ENCOUNTER — Emergency Department: Payer: BLUE CROSS/BLUE SHIELD

## 2018-03-06 ENCOUNTER — Emergency Department
Admission: EM | Admit: 2018-03-06 | Discharge: 2018-03-06 | Disposition: A | Discharge status: Home / Self Care | Payer: BLUE CROSS/BLUE SHIELD | Attending: Emergency Medicine | Admitting: Emergency Medicine

## 2018-03-06 ENCOUNTER — Encounter: Payer: Self-pay | Admitting: Emergency Medicine

## 2018-03-06 DIAGNOSIS — I130 Hypertensive heart and chronic kidney disease with heart failure and stage 1 through stage 4 chronic kidney disease, or unspecified chronic kidney disease: Secondary | ICD-10-CM | POA: Insufficient documentation

## 2018-03-06 DIAGNOSIS — Z951 Presence of aortocoronary bypass graft: Secondary | ICD-10-CM | POA: Insufficient documentation

## 2018-03-06 DIAGNOSIS — J45909 Unspecified asthma, uncomplicated: Secondary | ICD-10-CM | POA: Diagnosis not present

## 2018-03-06 DIAGNOSIS — I2510 Atherosclerotic heart disease of native coronary artery without angina pectoris: Secondary | ICD-10-CM | POA: Insufficient documentation

## 2018-03-06 DIAGNOSIS — I5042 Chronic combined systolic (congestive) and diastolic (congestive) heart failure: Secondary | ICD-10-CM | POA: Diagnosis not present

## 2018-03-06 DIAGNOSIS — Z87891 Personal history of nicotine dependence: Secondary | ICD-10-CM | POA: Diagnosis not present

## 2018-03-06 DIAGNOSIS — I252 Old myocardial infarction: Secondary | ICD-10-CM | POA: Diagnosis not present

## 2018-03-06 DIAGNOSIS — M5432 Sciatica, left side: Secondary | ICD-10-CM

## 2018-03-06 DIAGNOSIS — M5442 Lumbago with sciatica, left side: Secondary | ICD-10-CM | POA: Diagnosis not present

## 2018-03-06 DIAGNOSIS — E1122 Type 2 diabetes mellitus with diabetic chronic kidney disease: Secondary | ICD-10-CM | POA: Diagnosis not present

## 2018-03-06 DIAGNOSIS — M545 Low back pain: Secondary | ICD-10-CM | POA: Diagnosis present

## 2018-03-06 DIAGNOSIS — N183 Chronic kidney disease, stage 3 (moderate): Secondary | ICD-10-CM | POA: Diagnosis not present

## 2018-03-06 DIAGNOSIS — M25552 Pain in left hip: Secondary | ICD-10-CM

## 2018-03-06 DIAGNOSIS — R1032 Left lower quadrant pain: Secondary | ICD-10-CM | POA: Diagnosis not present

## 2018-03-06 MED ORDER — KETOROLAC TROMETHAMINE 60 MG/2ML IM SOLN
60.0000 mg | Freq: Once | INTRAMUSCULAR | Status: AC
  Administered 2018-03-06: 60 mg via INTRAMUSCULAR
  Filled 2018-03-06: qty 2

## 2018-03-06 MED ORDER — KETOROLAC TROMETHAMINE 10 MG PO TABS: 10.0000 mg | ORAL_TABLET | Freq: Three times a day (TID) | ORAL | 0 refills | Status: DC | PRN

## 2018-03-06 MED ORDER — INSULIN DEGLUDEC 100 UNIT/ML ~~LOC~~ SOPN: 58.0000 [IU] | PEN_INJECTOR | Freq: Every day | SUBCUTANEOUS | 0 refills | Status: DC

## 2018-03-06 MED ORDER — HYDROCODONE-ACETAMINOPHEN 5-325 MG PO TABS: 1.0000 | ORAL_TABLET | ORAL | 0 refills | Status: DC | PRN

## 2018-03-06 MED ORDER — ONDANSETRON 4 MG PO TBDP
4.0000 mg | ORAL_TABLET | Freq: Once | ORAL | Status: AC
  Administered 2018-03-06: 4 mg via ORAL
  Filled 2018-03-06: qty 1

## 2018-03-06 MED ORDER — DIAZEPAM 5 MG PO TABS
5.0000 mg | ORAL_TABLET | Freq: Once | ORAL | Status: AC
  Administered 2018-03-06: 5 mg via ORAL
  Filled 2018-03-06: qty 1

## 2018-03-06 MED ORDER — DIAZEPAM 5 MG PO TABS: 5.0000 mg | ORAL_TABLET | Freq: Three times a day (TID) | ORAL | 0 refills | Status: DC | PRN

## 2018-03-06 MED ORDER — HYDROMORPHONE HCL 1 MG/ML IJ SOLN
1.0000 mg | Freq: Once | INTRAMUSCULAR | Status: AC
  Administered 2018-03-06: 1 mg via INTRAMUSCULAR
  Filled 2018-03-06: qty 1

## 2018-03-06 MED ORDER — METHYLPREDNISOLONE 4 MG PO TABS: ORAL_TABLET | ORAL | 0 refills | Status: DC

## 2018-03-06 NOTE — ED Triage Notes (Addendum)
Pt in via [facility] from home with complaints of left back and leg pain x 3 days, seen at [facility] yesterday for same; dx with Sciatica, sent home with pain medication with no relief.  Pt A/Ox4, NAD noted at this time.

## 2018-03-06 NOTE — ED Notes (Signed)
Patient transported to X-ray 
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
## 2018-03-06 NOTE — Discharge Instructions (Signed)
Please take the Medrol Dosepak to decrease inflammation around her sciatic nerve.  This medication is a steroid medicine, and may increase your blood sugar so it is imperative to monitor your blood sugars and take your insulin sliding scale as directed by your primary care physician.  You may take Toradol for mild to moderate pain.  Take Toradol with food.  Do not take any other NSAID medications including Aleve, ibuprofen, Motrin, Advil, when you are taking this medication.  Hydrocodone is for severe pain and Valium is for severe muscle spasms.  You may not drive within 8 hours of taking hydrocodone or Valium.  You may also use an ice pack or heating pad, whichever feels better, for 15 minutes every 2 hours to decrease pain.  Return to the emergency department if you develop severe pain, numbness tingling or weakness, changes in bowel or bladder function, or any other symptoms concerning to you.

## 2018-03-13 DIAGNOSIS — M25561 Pain in right knee: Secondary | ICD-10-CM

## 2018-03-13 DIAGNOSIS — M25511 Pain in right shoulder: Secondary | ICD-10-CM

## 2018-03-13 HISTORY — DX: Pain in right knee: M25.561

## 2018-03-13 HISTORY — DX: Pain in right shoulder: M25.511

## 2018-03-15 ENCOUNTER — Other Ambulatory Visit: Payer: Self-pay | Admitting: Specialist

## 2018-03-15 ENCOUNTER — Other Ambulatory Visit (HOSPITAL_COMMUNITY): Payer: Self-pay | Admitting: Specialist

## 2018-03-15 DIAGNOSIS — M25511 Pain in right shoulder: Secondary | ICD-10-CM

## 2018-03-16 ENCOUNTER — Telehealth: Payer: Self-pay | Admitting: Endocrinology

## 2018-03-16 ENCOUNTER — Other Ambulatory Visit: Payer: Self-pay | Admitting: Orthopedic Surgery

## 2018-03-16 DIAGNOSIS — M25511 Pain in right shoulder: Secondary | ICD-10-CM

## 2018-03-16 NOTE — Telephone Encounter (Signed)
Pt called and given MD message. PT verbalized understanding and stated that he will call back with any future questions or concerns.

## 2018-03-16 NOTE — Telephone Encounter (Signed)
Blood sugars over 200, on steroids. He needs to increase his TRESIBA by 14 units until morning sugar below 140 and take extra 10 units NovoLog 4 times a day whenever blood sugars are over 200

## 2018-03-17 ENCOUNTER — Ambulatory Visit
Admission: RE | Admit: 2018-03-17 | Discharge: 2018-03-17 | Disposition: A | Discharge status: Home / Self Care | Payer: BLUE CROSS/BLUE SHIELD | Source: Ambulatory Visit | Attending: Orthopedic Surgery | Admitting: Orthopedic Surgery

## 2018-03-17 ENCOUNTER — Other Ambulatory Visit: Payer: BLUE CROSS/BLUE SHIELD

## 2018-03-17 DIAGNOSIS — M25511 Pain in right shoulder: Secondary | ICD-10-CM

## 2018-03-17 DIAGNOSIS — M25411 Effusion, right shoulder: Secondary | ICD-10-CM | POA: Diagnosis not present

## 2018-03-20 ENCOUNTER — Other Ambulatory Visit: Payer: BLUE CROSS/BLUE SHIELD

## 2018-03-22 ENCOUNTER — Ambulatory Visit: Payer: BLUE CROSS/BLUE SHIELD | Admitting: Endocrinology

## 2018-03-22 DIAGNOSIS — J189 Pneumonia, unspecified organism: Secondary | ICD-10-CM

## 2018-03-22 DIAGNOSIS — M25511 Pain in right shoulder: Secondary | ICD-10-CM | POA: Diagnosis not present

## 2018-03-22 DIAGNOSIS — I509 Heart failure, unspecified: Secondary | ICD-10-CM

## 2018-03-22 DIAGNOSIS — M75121 Complete rotator cuff tear or rupture of right shoulder, not specified as traumatic: Secondary | ICD-10-CM | POA: Diagnosis not present

## 2018-03-22 HISTORY — DX: Heart failure, unspecified: I50.9

## 2018-03-22 HISTORY — DX: Pneumonia, unspecified organism: J18.9

## 2018-03-29 ENCOUNTER — Telehealth: Payer: Self-pay | Admitting: Cardiology

## 2018-03-29 ENCOUNTER — Telehealth: Payer: Self-pay | Admitting: Cardiovascular Disease

## 2018-03-29 NOTE — Telephone Encounter (Signed)
Received OP call regarding pt having increased BLE swelling, right hand swelling and red spots on his legs. He has been taking his PRN dose of Lasix 40mg  PO QD for the last 4-5 days with no significant change in the swelling. He has not noticed an increase in his UO. He has not been keeping a log of his weights however he did weigh himself today and he was 261.8lb. His last office visit in 12/2017 the pt weighed 294lb. He denies chest pain, palpitations or SOB. He states that he is sedentary since a fall earlier this year. Given that he is not currently SOB and has not had a significant weight gain, he was advised to take another Lasix tomorrow morning, sleep with his extremitites elevated and weigh himself tomorrow AM for a better baseline. He is to call the office for further instruction tomorrow if no reduction. He was advised to come to the ED if he worsens with acute SOB or with unilateral swelling in his legs.   Kathyrn Drown NP-[facility] [place] Pager: [phone]

## 2018-03-29 NOTE — Telephone Encounter (Signed)
Left message to call back  

## 2018-03-29 NOTE — Telephone Encounter (Signed)
New message  Patient calling to inquire about increasing Lasix  Pt c/o swelling: STAT is pt has developed SOB within 24 hours  1) How much weight have you gained and in what time span? N/A  2) If swelling, where is the swelling located? LEGS, ANKLES, FEET, RIGHT HAND  3) Are you currently taking a fluid pill? YES  Are you currently SOB? YES 4) Do you have a log of your daily weights (if so, list)? NO  5) Have you gained 3 pounds in a day or 5 pounds in a week? N/A  6) Have you traveled recently? NO

## 2018-03-30 NOTE — Telephone Encounter (Signed)
Contacted pt and advised that per Dr. Claiborne Billings, pt is to increase PRN lasix 40 mg BID for 3 to 4 days to see if swelling decrease. Pt instructed to contact office if swelling doesn't resolve. Pt verbalized understanding.

## 2018-03-30 NOTE — Telephone Encounter (Signed)
Pt reports he is experiencing right hand and  bilateral leg edema with some mild SOB. He also states he has some red patches that are starting to weep.   He states he has been taking PRN 40 mg lasix daily for 1 1/2 weeks without any relief when normally he could take 1/2 tab 20 mg and swelling would decrease. He states he is not urinating  as much as he normally would with the lasix. Routing to Dr. Claiborne Billings for further recommendation.

## 2018-03-30 NOTE — Telephone Encounter (Signed)
New Message:       Pt is calling back with the same concerns as yesterday.

## 2018-03-31 ENCOUNTER — Encounter (HOSPITAL_COMMUNITY): Payer: Self-pay | Admitting: *Deleted

## 2018-03-31 ENCOUNTER — Inpatient Hospital Stay (HOSPITAL_COMMUNITY)
Admission: EM | Admit: 2018-03-31 | Discharge: 2018-04-04 | DRG: 291 | Disposition: A | Discharge status: Home / Self Care | Payer: BLUE CROSS/BLUE SHIELD | Attending: Internal Medicine | Admitting: Internal Medicine

## 2018-03-31 ENCOUNTER — Emergency Department (HOSPITAL_COMMUNITY): Payer: BLUE CROSS/BLUE SHIELD

## 2018-03-31 ENCOUNTER — Other Ambulatory Visit: Payer: Self-pay

## 2018-03-31 DIAGNOSIS — E7800 Pure hypercholesterolemia, unspecified: Secondary | ICD-10-CM

## 2018-03-31 DIAGNOSIS — Z91018 Allergy to other foods: Secondary | ICD-10-CM

## 2018-03-31 DIAGNOSIS — I480 Paroxysmal atrial fibrillation: Secondary | ICD-10-CM | POA: Diagnosis not present

## 2018-03-31 DIAGNOSIS — IMO0002 Reserved for concepts with insufficient information to code with codable children: Secondary | ICD-10-CM | POA: Diagnosis present

## 2018-03-31 DIAGNOSIS — I5043 Acute on chronic combined systolic (congestive) and diastolic (congestive) heart failure: Secondary | ICD-10-CM

## 2018-03-31 DIAGNOSIS — Z8249 Family history of ischemic heart disease and other diseases of the circulatory system: Secondary | ICD-10-CM

## 2018-03-31 DIAGNOSIS — L8990 Pressure ulcer of unspecified site, unspecified stage: Secondary | ICD-10-CM

## 2018-03-31 DIAGNOSIS — I10 Essential (primary) hypertension: Secondary | ICD-10-CM | POA: Diagnosis not present

## 2018-03-31 DIAGNOSIS — Z951 Presence of aortocoronary bypass graft: Secondary | ICD-10-CM | POA: Diagnosis not present

## 2018-03-31 DIAGNOSIS — E1122 Type 2 diabetes mellitus with diabetic chronic kidney disease: Secondary | ICD-10-CM | POA: Diagnosis present

## 2018-03-31 DIAGNOSIS — L89312 Pressure ulcer of right buttock, stage 2: Secondary | ICD-10-CM | POA: Diagnosis not present

## 2018-03-31 DIAGNOSIS — E785 Hyperlipidemia, unspecified: Secondary | ICD-10-CM | POA: Diagnosis present

## 2018-03-31 DIAGNOSIS — M25561 Pain in right knee: Secondary | ICD-10-CM | POA: Diagnosis not present

## 2018-03-31 DIAGNOSIS — I472 Ventricular tachycardia: Secondary | ICD-10-CM | POA: Diagnosis not present

## 2018-03-31 DIAGNOSIS — Z7982 Long term (current) use of aspirin: Secondary | ICD-10-CM

## 2018-03-31 DIAGNOSIS — R600 Localized edema: Secondary | ICD-10-CM

## 2018-03-31 DIAGNOSIS — R7989 Other specified abnormal findings of blood chemistry: Secondary | ICD-10-CM | POA: Diagnosis present

## 2018-03-31 DIAGNOSIS — Z91048 Other nonmedicinal substance allergy status: Secondary | ICD-10-CM

## 2018-03-31 DIAGNOSIS — N183 Chronic kidney disease, stage 3 unspecified: Secondary | ICD-10-CM | POA: Diagnosis present

## 2018-03-31 DIAGNOSIS — I872 Venous insufficiency (chronic) (peripheral): Secondary | ICD-10-CM | POA: Diagnosis present

## 2018-03-31 DIAGNOSIS — M549 Dorsalgia, unspecified: Secondary | ICD-10-CM | POA: Diagnosis present

## 2018-03-31 DIAGNOSIS — G4733 Obstructive sleep apnea (adult) (pediatric): Secondary | ICD-10-CM | POA: Diagnosis not present

## 2018-03-31 DIAGNOSIS — R059 Cough, unspecified: Secondary | ICD-10-CM

## 2018-03-31 DIAGNOSIS — E118 Type 2 diabetes mellitus with unspecified complications: Secondary | ICD-10-CM

## 2018-03-31 DIAGNOSIS — I2510 Atherosclerotic heart disease of native coronary artery without angina pectoris: Secondary | ICD-10-CM

## 2018-03-31 DIAGNOSIS — N179 Acute kidney failure, unspecified: Secondary | ICD-10-CM | POA: Diagnosis present

## 2018-03-31 DIAGNOSIS — I130 Hypertensive heart and chronic kidney disease with heart failure and stage 1 through stage 4 chronic kidney disease, or unspecified chronic kidney disease: Secondary | ICD-10-CM | POA: Diagnosis not present

## 2018-03-31 DIAGNOSIS — Z885 Allergy status to narcotic agent status: Secondary | ICD-10-CM

## 2018-03-31 DIAGNOSIS — Z6839 Body mass index (BMI) 39.0-39.9, adult: Secondary | ICD-10-CM | POA: Diagnosis not present

## 2018-03-31 DIAGNOSIS — R0602 Shortness of breath: Secondary | ICD-10-CM | POA: Diagnosis not present

## 2018-03-31 DIAGNOSIS — Z950 Presence of cardiac pacemaker: Secondary | ICD-10-CM

## 2018-03-31 DIAGNOSIS — Z9181 History of falling: Secondary | ICD-10-CM

## 2018-03-31 DIAGNOSIS — I447 Left bundle-branch block, unspecified: Secondary | ICD-10-CM | POA: Diagnosis present

## 2018-03-31 DIAGNOSIS — J189 Pneumonia, unspecified organism: Secondary | ICD-10-CM | POA: Diagnosis not present

## 2018-03-31 DIAGNOSIS — S3992XA Unspecified injury of lower back, initial encounter: Secondary | ICD-10-CM

## 2018-03-31 DIAGNOSIS — I509 Heart failure, unspecified: Secondary | ICD-10-CM | POA: Diagnosis not present

## 2018-03-31 DIAGNOSIS — I495 Sick sinus syndrome: Secondary | ICD-10-CM | POA: Diagnosis not present

## 2018-03-31 DIAGNOSIS — M25511 Pain in right shoulder: Secondary | ICD-10-CM | POA: Diagnosis not present

## 2018-03-31 DIAGNOSIS — J069 Acute upper respiratory infection, unspecified: Secondary | ICD-10-CM | POA: Insufficient documentation

## 2018-03-31 DIAGNOSIS — Z87891 Personal history of nicotine dependence: Secondary | ICD-10-CM

## 2018-03-31 DIAGNOSIS — R269 Unspecified abnormalities of gait and mobility: Secondary | ICD-10-CM | POA: Diagnosis not present

## 2018-03-31 DIAGNOSIS — Z888 Allergy status to other drugs, medicaments and biological substances status: Secondary | ICD-10-CM

## 2018-03-31 DIAGNOSIS — Z7901 Long term (current) use of anticoagulants: Secondary | ICD-10-CM | POA: Diagnosis not present

## 2018-03-31 DIAGNOSIS — R05 Cough: Secondary | ICD-10-CM

## 2018-03-31 DIAGNOSIS — R0902 Hypoxemia: Secondary | ICD-10-CM | POA: Diagnosis not present

## 2018-03-31 DIAGNOSIS — Z9109 Other allergy status, other than to drugs and biological substances: Secondary | ICD-10-CM

## 2018-03-31 DIAGNOSIS — Z9989 Dependence on other enabling machines and devices: Secondary | ICD-10-CM | POA: Diagnosis not present

## 2018-03-31 DIAGNOSIS — R0600 Dyspnea, unspecified: Secondary | ICD-10-CM | POA: Diagnosis not present

## 2018-03-31 DIAGNOSIS — Z87442 Personal history of urinary calculi: Secondary | ICD-10-CM

## 2018-03-31 DIAGNOSIS — Z79899 Other long term (current) drug therapy: Secondary | ICD-10-CM

## 2018-03-31 DIAGNOSIS — E119 Type 2 diabetes mellitus without complications: Secondary | ICD-10-CM | POA: Diagnosis not present

## 2018-03-31 DIAGNOSIS — I252 Old myocardial infarction: Secondary | ICD-10-CM

## 2018-03-31 DIAGNOSIS — K219 Gastro-esophageal reflux disease without esophagitis: Secondary | ICD-10-CM | POA: Diagnosis not present

## 2018-03-31 DIAGNOSIS — T502X5A Adverse effect of carbonic-anhydrase inhibitors, benzothiadiazides and other diuretics, initial encounter: Secondary | ICD-10-CM | POA: Diagnosis present

## 2018-03-31 DIAGNOSIS — I110 Hypertensive heart disease with heart failure: Secondary | ICD-10-CM

## 2018-03-31 DIAGNOSIS — I255 Ischemic cardiomyopathy: Secondary | ICD-10-CM | POA: Diagnosis present

## 2018-03-31 DIAGNOSIS — I340 Nonrheumatic mitral (valve) insufficiency: Secondary | ICD-10-CM | POA: Diagnosis not present

## 2018-03-31 DIAGNOSIS — Z881 Allergy status to other antibiotic agents status: Secondary | ICD-10-CM

## 2018-03-31 DIAGNOSIS — E1165 Type 2 diabetes mellitus with hyperglycemia: Secondary | ICD-10-CM | POA: Diagnosis not present

## 2018-03-31 DIAGNOSIS — R609 Edema, unspecified: Secondary | ICD-10-CM | POA: Diagnosis not present

## 2018-03-31 DIAGNOSIS — Z955 Presence of coronary angioplasty implant and graft: Secondary | ICD-10-CM

## 2018-03-31 DIAGNOSIS — I700 Atherosclerosis of aorta: Secondary | ICD-10-CM | POA: Diagnosis present

## 2018-03-31 DIAGNOSIS — Z794 Long term (current) use of insulin: Secondary | ICD-10-CM

## 2018-03-31 HISTORY — DX: Heart failure, unspecified: I50.9

## 2018-03-31 HISTORY — DX: Acute upper respiratory infection, unspecified: J06.9

## 2018-03-31 HISTORY — DX: Dorsalgia, unspecified: M54.9

## 2018-03-31 HISTORY — DX: Acute on chronic combined systolic (congestive) and diastolic (congestive) heart failure: I50.43

## 2018-03-31 LAB — CBC WITH DIFFERENTIAL/PLATELET
Basophils Absolute: 0 10*3/uL (ref 0.0–0.1)
Basophils Relative: 0 %
Eosinophils Absolute: 0.2 10*3/uL (ref 0.0–0.7)
Eosinophils Relative: 2 %
HCT: 35.6 % — ABNORMAL LOW (ref 39.0–52.0)
Hemoglobin: 11.1 g/dL — ABNORMAL LOW (ref 13.0–17.0)
LYMPHS ABS: 1.5 10*3/uL (ref 0.7–4.0)
Lymphocytes Relative: 15 %
MCH: 27.8 pg (ref 26.0–34.0)
MCHC: 31.2 g/dL (ref 30.0–36.0)
MCV: 89 fL (ref 78.0–100.0)
Monocytes Absolute: 0.6 10*3/uL (ref 0.1–1.0)
Monocytes Relative: 7 %
Neutro Abs: 7.6 10*3/uL (ref 1.7–7.7)
Neutrophils Relative %: 76 %
PLATELETS: 419 10*3/uL — AB (ref 150–400)
RBC: 4 MIL/uL — AB (ref 4.22–5.81)
RDW: 15.3 % (ref 11.5–15.5)
WBC: 9.9 10*3/uL (ref 4.0–10.5)

## 2018-03-31 LAB — GLUCOSE, CAPILLARY: Glucose-Capillary: 142 mg/dL — ABNORMAL HIGH (ref 65–99)

## 2018-03-31 LAB — PROTIME-INR
INR: 2.46
PROTHROMBIN TIME: 26.4 s — AB (ref 11.4–15.2)

## 2018-03-31 LAB — BASIC METABOLIC PANEL
Anion gap: 16 — ABNORMAL HIGH (ref 5–15)
BUN: 33 mg/dL — ABNORMAL HIGH (ref 6–20)
CHLORIDE: 108 mmol/L (ref 101–111)
CO2: 18 mmol/L — AB (ref 22–32)
CREATININE: 1.5 mg/dL — AB (ref 0.61–1.24)
Calcium: 8.8 mg/dL — ABNORMAL LOW (ref 8.9–10.3)
GFR calc Af Amer: 52 mL/min — ABNORMAL LOW (ref >60)
GFR calc non Af Amer: 45 mL/min — ABNORMAL LOW (ref >60)
GLUCOSE: 131 mg/dL — AB (ref 65–99)
Potassium: 4.3 mmol/L (ref 3.5–5.1)
SODIUM: 142 mmol/L (ref 135–145)

## 2018-03-31 LAB — I-STAT TROPONIN, ED: Troponin i, poc: 0.02 ng/mL (ref 0.00–0.08)

## 2018-03-31 LAB — BRAIN NATRIURETIC PEPTIDE: B Natriuretic Peptide: 203.4 pg/mL — ABNORMAL HIGH (ref 0.0–100.0)

## 2018-03-31 MED ORDER — SOTALOL HCL 120 MG PO TABS
120.0000 mg | ORAL_TABLET | Freq: Two times a day (BID) | ORAL | Status: DC
  Administered 2018-03-31 – 2018-04-04 (×8): 120 mg via ORAL
  Filled 2018-03-31 (×10): qty 1

## 2018-03-31 MED ORDER — LOSARTAN POTASSIUM 25 MG PO TABS
25.0000 mg | ORAL_TABLET | Freq: Every day | ORAL | Status: DC
  Administered 2018-04-01 – 2018-04-04 (×4): 25 mg via ORAL
  Filled 2018-03-31 (×4): qty 1

## 2018-03-31 MED ORDER — ACETAMINOPHEN 650 MG RE SUPP: 650.0000 mg | Freq: Four times a day (QID) | RECTAL | Status: DC | PRN

## 2018-03-31 MED ORDER — GABAPENTIN 300 MG PO CAPS
300.0000 mg | ORAL_CAPSULE | Freq: Three times a day (TID) | ORAL | Status: DC
  Administered 2018-03-31 – 2018-04-04 (×12): 300 mg via ORAL
  Filled 2018-03-31 (×12): qty 1

## 2018-03-31 MED ORDER — FUROSEMIDE 10 MG/ML IJ SOLN
40.0000 mg | Freq: Two times a day (BID) | INTRAMUSCULAR | Status: DC
  Administered 2018-03-31 – 2018-04-02 (×4): 40 mg via INTRAVENOUS
  Filled 2018-03-31 (×4): qty 4

## 2018-03-31 MED ORDER — FUROSEMIDE 10 MG/ML IJ SOLN
40.0000 mg | Freq: Once | INTRAMUSCULAR | Status: AC
  Administered 2018-03-31: 40 mg via INTRAVENOUS
  Filled 2018-03-31: qty 4

## 2018-03-31 MED ORDER — INSULIN GLARGINE 100 UNIT/ML ~~LOC~~ SOLN
30.0000 [IU] | Freq: Every day | SUBCUTANEOUS | Status: DC
  Administered 2018-03-31 – 2018-04-03 (×4): 30 [IU] via SUBCUTANEOUS
  Filled 2018-03-31 (×5): qty 0.3

## 2018-03-31 MED ORDER — INSULIN ASPART 100 UNIT/ML ~~LOC~~ SOLN
0.0000 [IU] | Freq: Three times a day (TID) | SUBCUTANEOUS | Status: DC
  Administered 2018-04-01: 2 [IU] via SUBCUTANEOUS
  Administered 2018-04-01: 5 [IU] via SUBCUTANEOUS
  Administered 2018-04-02: 11 [IU] via SUBCUTANEOUS
  Administered 2018-04-02: 3 [IU] via SUBCUTANEOUS
  Administered 2018-04-02 – 2018-04-03 (×2): 5 [IU] via SUBCUTANEOUS
  Administered 2018-04-03: 2 [IU] via SUBCUTANEOUS
  Administered 2018-04-03: 8 [IU] via SUBCUTANEOUS
  Administered 2018-04-04: 5 [IU] via SUBCUTANEOUS
  Administered 2018-04-04: 8 [IU] via SUBCUTANEOUS
  Administered 2018-04-04: 5 [IU] via SUBCUTANEOUS

## 2018-03-31 MED ORDER — ACETAMINOPHEN 325 MG PO TABS: 650.0000 mg | ORAL_TABLET | Freq: Four times a day (QID) | ORAL | Status: DC | PRN

## 2018-03-31 MED ORDER — ASPIRIN EC 81 MG PO TBEC
81.0000 mg | DELAYED_RELEASE_TABLET | Freq: Every day | ORAL | Status: DC
  Administered 2018-04-01 – 2018-04-04 (×4): 81 mg via ORAL
  Filled 2018-03-31 (×4): qty 1

## 2018-03-31 MED ORDER — WARFARIN SODIUM 5 MG PO TABS
5.0000 mg | ORAL_TABLET | Freq: Once | ORAL | Status: AC
  Administered 2018-03-31: 5 mg via ORAL
  Filled 2018-03-31: qty 1

## 2018-03-31 MED ORDER — ALBUTEROL SULFATE (2.5 MG/3ML) 0.083% IN NEBU: 2.5000 mg | INHALATION_SOLUTION | RESPIRATORY_TRACT | Status: DC | PRN

## 2018-03-31 MED ORDER — PANTOPRAZOLE SODIUM 40 MG PO TBEC
40.0000 mg | DELAYED_RELEASE_TABLET | Freq: Every day | ORAL | Status: DC
  Administered 2018-04-01 – 2018-04-04 (×4): 40 mg via ORAL
  Filled 2018-03-31 (×4): qty 1

## 2018-03-31 MED ORDER — WARFARIN - PHARMACIST DOSING INPATIENT: Freq: Every day | Status: DC

## 2018-03-31 MED ORDER — ALFUZOSIN HCL ER 10 MG PO TB24
10.0000 mg | ORAL_TABLET | Freq: Every day | ORAL | Status: DC
  Administered 2018-04-01 – 2018-04-04 (×4): 10 mg via ORAL
  Filled 2018-03-31 (×4): qty 1

## 2018-03-31 MED ORDER — INSULIN DEGLUDEC 100 UNIT/ML ~~LOC~~ SOPN: 30.0000 [IU] | PEN_INJECTOR | Freq: Every day | SUBCUTANEOUS | Status: DC

## 2018-03-31 MED ORDER — GUAIFENESIN ER 600 MG PO TB12
600.0000 mg | ORAL_TABLET | Freq: Two times a day (BID) | ORAL | Status: DC
  Administered 2018-03-31 – 2018-04-04 (×8): 600 mg via ORAL
  Filled 2018-03-31 (×8): qty 1

## 2018-03-31 MED ORDER — ROSUVASTATIN CALCIUM 5 MG PO TABS
7.5000 mg | ORAL_TABLET | Freq: Every day | ORAL | Status: DC
  Administered 2018-03-31 – 2018-04-03 (×4): 7.5 mg via ORAL
  Filled 2018-03-31 (×5): qty 1.5

## 2018-03-31 MED ORDER — IPRATROPIUM-ALBUTEROL 0.5-2.5 (3) MG/3ML IN SOLN
3.0000 mL | Freq: Four times a day (QID) | RESPIRATORY_TRACT | Status: DC
  Administered 2018-03-31 – 2018-04-01 (×2): 3 mL via RESPIRATORY_TRACT
  Filled 2018-03-31 (×2): qty 3
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
## 2018-03-31 NOTE — ED Triage Notes (Signed)
Wife states patient has been stuck in a chair for several weeks because he is having back issues and knee problems. States he is taking muscle relaxer's ,  States his fluid pills aren't working and his lower legs are swelling and now they are weeping. Wife states she has been trying to call cards for 2 days, and was told to increased lasix of which they did. States he went to ortho rehab this am and sats were low was told to come to the ED.
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
## 2018-03-31 NOTE — H&P (Addendum)
Date: 03/31/2018               Patient Name:  Charles [name] MRN: [id number]  DOB: 12/26/1945 Age / Sex: 72 y.o., male   PCP: Cyndi Bender, PA-C         Medical Service: Internal Medicine Teaching Service         Attending Physician: Dr. Bartholomew Crews, MD    First Contact: Dr. Aggie Hacker Pager: [phone]  Second Contact: Dr. 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 The patient states that approximately 4-5 days ago he began noticing that his lower extremities were very swollen and weeping. Around the same time he began experiencing severe dyspnea on exertion and was unable to walk across his family room with getting SOB. He states prior to this he had no little to no dyspnea on exertion. He also states he has had a dry cough and upper respiratory/viral like symptoms, including congestion, for about 2 weeks duration. He has been unable to lay flat due to the cough, but denies orthopnea or PND.  He has been having to sleep sitting up secondary to his cough. He also has been unable to tolerate his CPAP machine. He denies chest pain. He states prior to getting his CABG he had severe fatigue and was unable to ambulate, but had no CP or anginal like symptoms. The patient takes Lasix 20 mg PRN for LE swelling and states that he had been taking daily for the past several days without any improvement in his LE edema. He called his cardiologist Dr. Claiborne Billings, who recommend he take 40 mg of lasix, but  this did not improve his swelling. He denies abdominal distention and does not think he is holding onto fluid in his abdomen, which he can usually feel. No changes in medications. Was prescribed a medrol dose pack several weeks ago after an ED visit for back pain, but is not currently on steroids. States he weighed 271 pounds several days ago and was 273 this AM. Per cardiology notes, his weight in his most recent office visit was 294. Denies diet changes or increased fluid intake.   The patient states that "everything started" after he fell down the stairs in February. At that time (prior to fall), he was doing well with cardiac rehabilitation and able to exercise daily (stationary bike, lifting weights). Then he had the fall and has now had decreased ambulation and mobility, as well as severe right shoulder pain.  He says he was at the Orthopedics office today to be evaluated for his shoulder, and he had a pulse ox reading of 87% and the orthopedic surgeon recommended he come to [facility].    ED Course Vitals: Blood pressure 94/58, pulse 77, temperature 98.6 F (37 C) resp. Rate 18,  SpO2 93 % on RA. Labs: Cr 1.5 (bl 1.2-1.3), BNP 203, Hgb 11.1, MCV 89.0  Meds: 40 mg lasix IV Consults: cardiology  Meds:  Current Meds  Medication Sig  . alfuzosin (UROXATRAL)  10 MG 24 hr tablet Take 10 mg by mouth daily with breakfast.  . aspirin EC 81 MG EC tablet Take 1 tablet (81 mg total) by mouth daily.  . Chlorpheniramine Maleate (ALLERGY RELIEF PO) Take 1 tablet by mouth daily as needed (allergies).  . Chlorpheniramine-DM (CORICIDIN HBP COUGH/COLD PO) Take 1 tablet by mouth daily as needed (allergies).  . dapagliflozin propanediol (FARXIGA) 5 MG TABS tablet Take 5 mg by mouth daily.  . diazepam (VALIUM) 5 MG tablet Take 1 tablet (5 mg total) by mouth every 8 (eight) hours as needed for muscle spasms.  . Ferrous Gluconate (IRON 27 PO) Take 1 tablet by mouth daily.  . furosemide (LASIX) 40 MG tablet Take 1  tablet (40 mg total) by mouth daily as needed for fluid or edema.  . gabapentin (NEURONTIN) 300 MG capsule Take 1 capsule (300 mg total) by mouth 3 (three) times daily.  [name] guaiFENesin (MUCINEX) 600 MG 12 hr tablet Take 600 mg by mouth daily as needed for cough or to loosen phlegm.  . insulin aspart (NOVOLOG) 100 UNIT/ML injection Inject 15 units at breakfast, 20 at lunch, and 20-25 at supper  . insulin degludec (TRESIBA) 100 UNIT/ML SOPN FlexTouch Pen Inject 0.58 mLs (58 Units total) into the skin daily. (Patient taking differently: Inject 40 Units into the skin daily. )  . losartan (COZAAR) 50 MG tablet Take 25 mg by mouth daily.  . metFORMIN (GLUCOPHAGE-XR) 500 MG 24 hr tablet Take 3 tablets (1,500 mg total) daily with supper by mouth.  . nitroGLYCERIN (NITROSTAT) 0.4 MG SL tablet Place 0.4 mg under the tongue as needed. For chest pain  . pantoprazole (PROTONIX) 40 MG tablet Take 1 tablet (40 mg total) by mouth daily.  . polyethylene glycol (MIRALAX / GLYCOLAX) packet Take 17 g by mouth daily as needed for mild constipation.   . promethazine (PHENERGAN) 25 MG tablet Take 225 mg by mouth every 6 (six) hours as needed for nausea/vomiting.  . rosuvastatin (CRESTOR) 5 MG tablet Take 1.5 tablets (7.5 mg total) by mouth daily.  . sotalol (BETAPACE) 120 MG tablet Take 1 tablet (120 mg total) by mouth 2 (two) times daily.  . VENTOLIN HFA 108 (90 Base) MCG/ACT inhaler Inhale 2 puffs into the lungs 2 (two) times daily as needed for wheezing or shortness of breath.   . warfarin (COUMADIN) 5 MG tablet Take 1 to 2 tablets by mouth daily as directed by coumadin clinic     Allergies: Allergies as of 03/31/2018 - Review Complete 03/31/2018  Allergen Reaction Noted  . Chocolate Hives, Shortness Of Breath, and Swelling 05/28/2015  . Statins Other (See Comments) 01/02/2013  . Black pepper [piper]  08/02/2016  . Codeine Itching 04/30/2008  . Oxytetracycline Other (See Comments) 04/30/2008  . Tape Rash and  Other (See Comments) 09/07/2016   Past Medical History:  Diagnosis Date  . Acute cystitis with hematuria 12/23/2016  . Allergy   . Anemia   . Arthritis    "knees, hands, lower back" (07/29/2016)  . Asthma    "touch q once & awhile" (02/05/2016)  . Cataract   . Chronic bronchitis ([place])   . Chronic venous insufficiency    with prior venous stasis ulcers x 1 2013  . Complication of anesthesia    "when coming out, I choke and get very restless if breathing tube is still in"  . Coronary artery disease    a. history of multiple stents to the LCx, LAD, and RCA b. s/p  CABG in 08/2016 with LIMA-LAD, SVG-OM, SVG-PDA, and SVG-D1  . Diabetes mellitus, type II ([place])    type 2  . Dyslipidemia   . Elevated creatine kinase level 2018  . Exogenous obesity    severe  . Helicobacter pylori gastritis 2016  . History of blood transfusion ~ 2015   related to "when they went in to get my kidney stones"  . History of kidney stones   . Hx of colonic polyps 09/2006   inflammatory polyp at hepatic flexure. not adenomatous or malignant.   . Hyperlipidemia   . Hypertension   . LBBB (left bundle branch block)    He has developed a native LBBB which was seen on his last visit of March 2014 (From OV note 07/03/13)   . Long term (current) use of anticoagulants   . MI (myocardial infarction) ([place]) 1995   "mild"  . Nephrolithiasis   . OSA on CPAP    "nasal CPAP" (07/29/2016) patient does not know settings   . Osteoarthritis, knee   . PAF (paroxysmal atrial fibrillation) ([place])    a. on Xarelto  . 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 Zephyr XL DR 5826, dual chamber, rate responsive. No arrhythmias recorded and he has an excellent threshold.  . Rotator cuff tear last 2 years   right   . Sleep apnea   . SSS (sick sinus syndrome) ([place])   . Statin intolerance    Hx of. Now tolerating Zetia & Livalo well.   [name] Unstable angina ([place]) 08/2016   mild    Family History:  Family History    Problem Relation Age of Onset  . Heart attack Mother 90       Died age 85  . Arthritis Sister   . Epilepsy Brother   . Stroke Maternal Grandmother   . Lung cancer Maternal Grandfather   . Stroke Paternal Grandfather   . Hypertension Sister   . Colon polyps Sister   . Colon cancer Neg Hx   . Esophageal cancer Neg Hx   . Pancreatic cancer Neg Hx   . Rectal cancer Neg Hx   . Stomach cancer Neg Hx     Social History:  Social History   Tobacco Use  . Smoking status: Former Smoker    Packs/day: 1.00    Years: 5.00    Pack years: 5.00    Types: Cigarettes    Last attempt to quit: 11/22/1974    Years since quitting: 43.3  . Smokeless tobacco: Never Used  Substance Use Topics  . Alcohol use: No    Alcohol/week: 0.0 oz  . Drug use: No    Review of Systems: A complete ROS was negative except as per HPI.   Physical Exam: Blood pressure (!) 116/92, pulse 70, temperature 98.6 F (37 C), temperature source Oral, resp. rate (!) 23, height 5\' 9"  (1.753 m), weight 273 lb 4.8 oz (124 kg), SpO2 97 %. Physical Exam  Constitutional: He is oriented to person, place, and time. He appears well-developed and well-nourished. No distress.  HENT:  Head: Normocephalic and atraumatic.  Eyes: Conjunctivae are normal. No scleral icterus.  Neck: Normal range of motion. Neck supple. No JVD present.  Cardiovascular: Normal rate, regular rhythm and normal heart sounds. Exam reveals decreased pulses (possibly secondary to edema ).  Pulmonary/Chest: Effort normal. He has rhonchi (scattered rhonchi ). He has no rales.  Abdominal: Soft. Bowel sounds are normal. There is no tenderness.  Musculoskeletal: He exhibits  edema (3 pitting edema in lower extremities b/l ).  Neurological: He is alert and oriented to person, place, and time. No cranial nerve deficit or sensory deficit.  Skin:  Lower extremities felt cool. Chronic venous statis changes in bilateral lower extremities.    EKG: personally reviewed my  interpretation is atrial paced, NSR, no evidence of acute ischemic changes  CXR: personally reviewed my interpretation is no focal opacity or pleural effusion, cardiomegaly, fluid in fissures   Assessment & Plan by Problem: Principal Problem:   Acute on chronic combined systolic and diastolic CHF, NYHA class 3 (HCC) Active Problems:   Essential hypertension   Uncontrolled type 2 diabetes mellitus with complication (HCC)   OSA on CPAP   CKD (chronic kidney disease), stage III (HCC)   Back pain due to injury   URI with cough and congestion   CHF exacerbation (HCC)  Acute on Chronic Combined Systolic and Diastolic CHF Patient's lower extremity edema, dyspnea on exertion, and cough likely secondary to acute heart failure exacerbation. Also possible the patients viral respiratory infection contributed to acute CHF exacerbation. CXR without significant pulmonary edema. Do not think the patient has PE as he is not hypoxic, has no chest pain, no tachycardia, and is on chronic anticoagulation. EKG without ischemic changes and I-stat troponin negative, unlikely ACS. Exam with 3+ LE pitting edema, no rales appreciated, no JVD.  -Cardiology consulted in ED, appreciate recs -Echo ordered, will f/u -Strict I/Os -Will continue Lasix 40 mg BID -Daily weights  -Daily BMP to monitor renal function with diuresis -Supportively treat cough with breathing treatments PRN and mucinex  PAF Currently in NSR, atrial paced and rate controlled.  -Warfarin per pharmacy -PT/INR pending  -Continue Sotalol 120 mg BID   Type 2 DM  Most recent Hemoglobin A1C 6.9. Home regimen includes Tresiba 36 units daily, NovoLog 15, 20, 25 units with meals.  -CBG monitoring TID AC and nightly  -Lantus 30 units daily  -SSI moderate coverage   CAD s/p CABG EKG without ischemic changes, I-stat troponin negative. No active CP.  -Continue ASA 81 mg   HTN Normotensive.  -Continue Losartan 25 mg daily   OSA Unable to  tolerate to CPAP due to cough.  -Patient declined CPAP on admission   GERD  -Continue Protonix 40 mg   Code Status: Full Code Confirmed on admission DVT ppx: warfarin   Dispo: Admit patient to Inpatient with expected length of stay greater than 2 midnights.  Signed: Melanee Spry, MD 03/31/2018, 6:38 PM  Pager: [phone]

## 2018-03-31 NOTE — Progress Notes (Signed)
ANTICOAGULATION CONSULT NOTE - Initial Consult  Pharmacy Consult for warfarin Indication: atrial fibrillation  Allergies  Allergen Reactions  . Chocolate Hives, Shortness Of Breath and Swelling  . Statins Other (See Comments)    Mental changes, muscle aches  . Black Pepper [[place]]     Irritates back of throat  . Codeine Itching  . Oxytetracycline Other (See Comments)    Flushing in sunlight  . Tape Rash and Other (See Comments)    SKIN IS VERY SENSITIVE!!    Patient Measurements: Height: 5\' 9"  (175.3 cm) Weight: 271 lb 11.2 oz (123.2 kg) IBW/kg (Calculated) : 70.7  Vital Signs: Temp: 97.5 F (36.4 C) (05/10 1858) Temp Source: Oral (05/10 1858) BP: 120/66 (05/10 1858) Pulse Rate: 58 (05/10 1858)  Labs: Recent Labs    03/31/18 1110 03/31/18 1917  HGB 11.1*  --   HCT 35.6*  --   PLT 419*  --   LABPROT  --  26.4*  INR  --  2.46  CREATININE 1.50*  --     Estimated Creatinine Clearance: 58.6 mL/min (A) (by C-G formula based on SCr of 1.5 mg/dL (H)).  Assessment: CC/HPI: 72 yo m presenting with back pain  PMH: CAD, CABG, Afib on warfarin, SSS s/p OOM, OSA, DM, HTN, HLD, HF  Anticoag: warfarin pta for afib.  Will need to f/u home dose since med rec only says took 5 mg 5/9 Admit INR 2.46   CHA2DS2/VAS Stroke Risk Points  Current as of a minute ago     5 >= 2 Points: High Risk  1 - 1.99 Points: Medium Risk  0 Points: Low Risk    This is the only CHA2DS2/VAS Stroke Risk Points available for the past  year.:  Last Change: N/A     Details    This score determines the patient's risk of having a stroke if the  patient has atrial fibrillation.     Points Metrics  1 Has Congestive Heart Failure:  Yes    Current as of a minute ago  1 Has Vascular Disease:  Yes    Current as of a minute ago  1 Has Hypertension:  Yes    Current as of a minute ago  1 Age:  73    Current as of a minute ago  1 Has Diabetes:  Yes    Current as of a minute ago  0 Had Stroke:  No   Had TIA:  No  Had thromboembolism:  No    Current as of a minute ago  0 Male:  No    Current as of a minute ago   Renal: SCr 1.5  Heme/Onc: H&H 11.1/35.6, Plt 419  Goal of Therapy:  INR 2-3 Monitor platelets by anticoagulation protocol: Yes   Plan:  Warfarin 5 mg x 1 Daily INR  Levester Fresh, PharmD, BCPS, BCCCP Clinical Pharmacist Clinical phone for 03/31/2018 from 1430 - 2300: [phone] If after 2300, please call main pharmacy at: [phone] 03/31/2018 8:53 PM

## 2018-04-01 ENCOUNTER — Inpatient Hospital Stay (HOSPITAL_COMMUNITY): Payer: BLUE CROSS/BLUE SHIELD

## 2018-04-01 ENCOUNTER — Other Ambulatory Visit: Payer: Self-pay

## 2018-04-01 DIAGNOSIS — I340 Nonrheumatic mitral (valve) insufficiency: Secondary | ICD-10-CM

## 2018-04-01 DIAGNOSIS — I509 Heart failure, unspecified: Secondary | ICD-10-CM

## 2018-04-01 DIAGNOSIS — R7989 Other specified abnormal findings of blood chemistry: Secondary | ICD-10-CM

## 2018-04-01 DIAGNOSIS — R609 Edema, unspecified: Secondary | ICD-10-CM

## 2018-04-01 LAB — GLUCOSE, CAPILLARY
Glucose-Capillary: 149 mg/dL — ABNORMAL HIGH (ref 65–99)
Glucose-Capillary: 109 mg/dL — ABNORMAL HIGH (ref 65–99)
Glucose-Capillary: 214 mg/dL — ABNORMAL HIGH (ref 65–99)
Glucose-Capillary: 218 mg/dL — ABNORMAL HIGH (ref 65–99)

## 2018-04-01 LAB — URINALYSIS, ROUTINE W REFLEX MICROSCOPIC
BACTERIA UA: NONE SEEN
Bilirubin Urine: NEGATIVE
Glucose, UA: NEGATIVE mg/dL
Ketones, ur: NEGATIVE mg/dL
LEUKOCYTES UA: NEGATIVE
Nitrite: NEGATIVE
Protein, ur: NEGATIVE mg/dL
Specific Gravity, Urine: 1.006 (ref 1.005–1.030)
pH: 5 (ref 5.0–8.0)

## 2018-04-01 LAB — PROTIME-INR
INR: 2.54
PROTHROMBIN TIME: 27.2 s — AB (ref 11.4–15.2)

## 2018-04-01 LAB — BASIC METABOLIC PANEL
Anion gap: 14 (ref 5–15)
BUN: 40 mg/dL — ABNORMAL HIGH (ref 6–20)
CO2: 20 mmol/L — ABNORMAL LOW (ref 22–32)
Calcium: 8.5 mg/dL — ABNORMAL LOW (ref 8.9–10.3)
Chloride: 103 mmol/L (ref 101–111)
Creatinine, Ser: 1.56 mg/dL — ABNORMAL HIGH (ref 0.61–1.24)
GFR calc Af Amer: 50 mL/min — ABNORMAL LOW (ref >60)
GFR calc non Af Amer: 43 mL/min — ABNORMAL LOW (ref >60)
GLUCOSE: 172 mg/dL — AB (ref 65–99)
Potassium: 3.9 mmol/L (ref 3.5–5.1)
Sodium: 137 mmol/L (ref 135–145)

## 2018-04-01 LAB — HEPATIC FUNCTION PANEL
ALK PHOS: 202 U/L — AB (ref 38–126)
ALT: 53 U/L (ref 17–63)
AST: 32 U/L (ref 15–41)
Albumin: 2.4 g/dL — ABNORMAL LOW (ref 3.5–5.0)
BILIRUBIN INDIRECT: 0.5 mg/dL (ref 0.3–0.9)
Bilirubin, Direct: 0.2 mg/dL (ref 0.1–0.5)
Total Bilirubin: 0.7 mg/dL (ref 0.3–1.2)
Total Protein: 7.5 g/dL (ref 6.5–8.1)

## 2018-04-01 LAB — ECHOCARDIOGRAM COMPLETE
HEIGHTINCHES: 69 in
Weight: 4337.6 oz

## 2018-04-01 LAB — TROPONIN I: Troponin I: <0.03 ng/mL (ref <0.03)

## 2018-04-01 MED ORDER — BENZONATATE 100 MG PO CAPS
100.0000 mg | ORAL_CAPSULE | Freq: Three times a day (TID) | ORAL | Status: DC
  Administered 2018-04-01 – 2018-04-04 (×11): 100 mg via ORAL
  Filled 2018-04-01 (×11): qty 1

## 2018-04-01 MED ORDER — BENZONATATE 100 MG PO CAPS: 100.0000 mg | ORAL_CAPSULE | Freq: Three times a day (TID) | ORAL | Status: DC

## 2018-04-01 MED ORDER — ENSURE ENLIVE PO LIQD
237.0000 mL | Freq: Two times a day (BID) | ORAL | Status: DC
  Administered 2018-04-01 – 2018-04-04 (×6): 237 mL via ORAL

## 2018-04-01 MED ORDER — WARFARIN SODIUM 5 MG PO TABS
5.0000 mg | ORAL_TABLET | Freq: Once | ORAL | Status: AC
  Administered 2018-04-01: 5 mg via ORAL
  Filled 2018-04-01: qty 2

## 2018-04-01 MED ORDER — IPRATROPIUM-ALBUTEROL 0.5-2.5 (3) MG/3ML IN SOLN
3.0000 mL | Freq: Three times a day (TID) | RESPIRATORY_TRACT | Status: DC
  Administered 2018-04-01 – 2018-04-04 (×10): 3 mL via RESPIRATORY_TRACT
  Filled 2018-04-01 (×11): qty 3

## 2018-04-01 NOTE — Progress Notes (Addendum)
Subjective:  Patient seen and examined. States he had difficulty sleeping last night due to his cough, and feels tired this morning. Denies CP or SOB. Leg swelling remains the same. He has been able to ambulate short distances around his room, and denies DOE. He is is willing to work with PT/OT today.   Objective:  Vital signs in last 24 hours: Vitals:   03/31/18 2108 04/01/18 0020 04/01/18 0157 04/01/18 0424  BP: (!) 90/51 105/63  113/80  Pulse: 70 70  70  Resp:  20  20  Temp:  99.2 F (37.3 C)  97.6 F (36.4 C)  TempSrc:  Oral  Oral  SpO2: 96% 95% 98% 99%  Weight:    271 lb 1.6 oz (123 kg)  Height:       General: Sitting in bedside chair comfortably, NAD HEENT: [place]/AT, no JVD Cardiac: RRR, No R/M/G appreciated Pulm: normal effort, diffuse fine crackles and course breath sounds scattered throughout lung fields Abd: soft, non tender, non distended, BS normal Ext: lower extremities cool to touch, no cyanosis, 2-3+ LE pitting edema to the knees, minimal improvement from yesterday Neuro: alert and oriented X3, cranial nerves II-XII grossly intact   Assessment/Plan:  Principal Problem:   CHF exacerbation (HCC) Active Problems:   Essential hypertension   Uncontrolled type 2 diabetes mellitus with complication (HCC)   OSA on CPAP   CKD (chronic kidney disease), stage III (HCC)   Acute on chronic combined systolic and diastolic CHF, NYHA class 3 (HCC)   Back pain due to injury   URI with cough and congestion   Leg edema   Acute on Chronic Combined Systolic and Diastolic CHF Lower Extremity Edema Patient presenting with 3+ lower extremity pitting edema. CXR with fissural fluid but without significant pulmonary edema. TEE in 08/2016 with EF of 40-45%. Previous TTE demonstrated G1DD. Lower extremity edema minimally improved from admission. Patient is net negative 1 liter. Will require additional diuresis.  -Cardiology on, appreciate recs -Echo pending -Will get LE dopplers  to evaluate for DVT, although clinical suspicion low  -Strict I/Os - net negative  -Will continue IV Lasix 40 mg BID -Daily weights - 271, down 10 oz from admission  -Daily BMP to monitor renal function with diuresis - Cr 1.56, BUN 40 K 3.9  -Hepatic function panel with low albumin 2.4 - will follow up with UA to evaluate for proteinuria  Cough/Viral upper respiratory tract infection Cough and dyspnea on exertion likely secondary to viral URI. Patient aferbile, VSS, no leukocytosis. No focal opacities on CXR that would raise concern for pneumonia.  -Treating supportively at this time -Tessalon pearls TID -Albuterol nebs PRN -Mucinex   Elevated Creatinine, AKI Patient's baseline creatinine ranges from 1.2-1.35. Creatinine on admission 1.5 >> 1.56 today. Likely pre-renal etiology, but if LE edema and creatinine fail to improve with diuresis may consider UA to evaluate for proteinuria in setting or LE edema.  -Will monitor to closely with diuresis  -Daily BMP  PAF Currently in NSR, atrial paced and rate controlled.  -Warfarin per pharmacy -PT/INR - therapeutic  -Continue Sotalol 120 mg BID   Type 2 DM  Most recent Hemoglobin A1C 6.9. Currently meeting inpatient goals, AM fasting BG 149.  -CBG monitoring TID AC and nightly  -Lantus 30 units daily  -SSI moderate coverage   CAD s/p CABG EKG without ischemic changes, I-stat troponin negative. No active CP.  -Will get troponin level x 1  -Continue ASA 81 mg  -Continue Crestor  HTN Normotensive.  -Continue Losartan 25 mg daily   OSA Unable to tolerate to CPAP due to cough.  -Patient declined CPAP on admission   GERD  -Continue Protonix 40 mg   Code Status: Full Code Confirmed on admission DVT ppx: warfarin      Dispo: Anticipated discharge in approximately 2-3 day(s).   Melanee Spry, MD 04/01/2018, 6:26 AM Pager: [phone]

## 2018-04-01 NOTE — Progress Notes (Signed)
Initial Nutrition Assessment  DOCUMENTATION CODES:  Morbid obesity  INTERVENTION:  Ensure Enlive po BID, each supplement provides 350 kcal and 20 grams of protein  NUTRITION DIAGNOSIS:  Inadequate oral intake related to poor appetite, chronic illness(HF), acute illness (URI) as evidenced by per patient/family report.  GOAL:  Patient will meet greater than or equal to 90% of their needs   MONITOR:  PO intake, Supplement acceptance, Labs, Weight trends, I & O's  REASON FOR ASSESSMENT:  Malnutrition Screening Tool    ASSESSMENT:  72 y/o male PMHx HF, HTN/HLD, sss syndrome, pacemaker, PAF, MI, OSA, CAD s/p CABG, DM2. Presented w/ SOB, BLE swelling and DOE. Also Has had URI-like symptoms x2 weeks. Admitted for HF exacerbation.   Patient seen sitting up in chair. As he has reported to other providers, he tells RD he has had a general decline since he fell down several stair in February. Prior to that, he was in cardiac rehab, exercising and eating well. After his fall, he had much more difficulty perform ing ADLs and essentially became more sedentary/deconditioned. He then caught acutely ill with a URI and also has now been experiencing trouble controlling his HF.   He has had a decreased appetite, though does not give more detail. At baseline, he reports following a low sodium diet.   Weight wise, he reports that he started to gain weight mid-last year BECAUSE he was being more active and exercising; he believes he had more muscle mass. He got up to 290 lbs just prior to his fall in Feb. Since then, because he has been eating less/losing muscle, he has lost to 270 lbs. Per chart, his weight did peak last winter at 290, but his wt has largely fluctuated between 270-290 for the past 10 years. No real discernible gains/losses   At this time, patient reports abdominal pain, URI symptoms and insomnia negatively affecting his appetite. He was agreeable to the Ensure for extra kcals/protein given  decreased appetite.  Though, of note, he is documented as eating 100% of his meals.   Physical Exam: Mod-Sever lower extrem edema. No discernible muscle/fat loss  Labs: Albumin: 2.4, BG: 140-215, BUN/Creat:40/1.56 Meds: Ensure BID, insulin, lasix, PPI, Warfarin  Recent Labs  Lab 03/31/18 1110 04/01/18 0445  NA 142 137  K 4.3 3.9  CL 108 103  CO2 18* 20*  BUN 33* 40*  CREATININE 1.50* 1.56*  CALCIUM 8.8* 8.5*  GLUCOSE 131* 172*   NUTRITION - FOCUSED PHYSICAL EXAM: No discernible muscle/fat loss  Diet Order:   Diet Order           Diet Heart Room service appropriate? Yes; Fluid consistency: Thin  Diet effective now         EDUCATION NEEDS:  No education needs have been identified at this time  Skin:  Skin Assessment: Reviewed RN Assessment  Last BM:  5/10  Height:  Ht Readings from Last 1 Encounters:  03/31/18 5\' 9"  (1.753 m)   Weight:  Wt Readings from Last 1 Encounters:  04/01/18 271 lb 1.6 oz (123 kg)   Wt Readings from Last 10 Encounters:  04/01/18 271 lb 1.6 oz (123 kg)  03/06/18 292 lb (132.5 kg)  02/16/18 292 lb (132.5 kg)  12/28/17 294 lb 3.2 oz (133.4 kg)  12/09/17 299 lb (135.6 kg)  12/02/17 256 lb (116.1 kg)  09/08/17 279 lb 3.2 oz (126.6 kg)  08/12/17 282 lb (127.9 kg)  08/02/17 282 lb 12.8 oz (128.3 kg)  07/19/17 283 lb (  128.4 kg)   Ideal Body Weight:  72.73 kg  BMI:  Body mass index is 40.03 kg/m.  Estimated Nutritional Needs:  Kcal:  1880-2080 kcals (MSJ +/- 100) Protein:  87-102g Pro (1.2-1.4 g/kg ibw) Fluid:  Per MD goals  Burtis Junes RD, LDN, CNSC Clinical Nutrition Available Tues-Sat via Pager: [phone] 04/01/2018 5:57 PM

## 2018-04-01 NOTE — Progress Notes (Signed)
  Date: 04/01/2018  Patient name: Charles [name]  Medical record number: [id number]  Date of birth: 05/22/1946   I have seen and evaluated Charles [name] and discussed their care with the Residency Team. 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 In Feb, he fell down stairs injuring his R shoudler and R knee. The R knee pain prevented cont his exercise regimen inc cardiac rehab, stationary bike, and weight lifting. His overall mobility also declined. Two weeks who he had contact with his sister who had URI like sxs and he developed dry cough, congestion, and inability to lay flat. 5 days prior to admission, he had increased LE edema with weeping and DOE. He was at his ortho's office on day of admission and dest to 87% and sent to the ED.   He was started on IV lasix 40 BID and today states he is overall better but his cough is interfering with sleep.  Vitals:   04/01/18 0424 04/01/18 0953  BP: 113/80   Pulse: 70   Resp: 20   Temp: 97.6 F (36.4 C)   SpO2: 99% 100%   Net - 1.1 L Weight - 2 lbs Gen sitting in recliner, NAD, speaking in full sentences HRRR no MRG L good air flow, coarse air sounds Ext + 2 edema B, warm Skin ; thin, min erythema LE B  C 1.56 BNP 203 Trop - x2 HgB 11.1  I personally viewed the CXR images and confirmed my reading with the official read. PA and lateral, rotated, pacer L chest, no abnl  I personally viewed the EKG and confirmed my reading with the official read. Atrial pacing, LAD, intraventricular conduction delay  Assessment and Plan: I have seen and evaluated the patient as outlined above. I agree with the formulated Assessment and Plan as detailed in the residents' note, with the following changes: Charles [name] is a 72 yo man with DM, combined diastolic and systolic HF  (28-41%), PAF on warfarin, CAD s/p CABG (2017), pacemaker 2/2 sick sinus syndrome, OSA unable to tolerate CPAP recently, HTN, and chronic venous insufficiency. He has been more sedentary since Feb following a fall which injured his R knee. He now presents with LE edema, cough, dyspnea, and orthopnea. He has no sig signs of vol overload but his lasix was increased and he is net - 1 L without an increase in his Cr from admission although his admitting Cr is above baseline. His sxs likely are due to a resp virus and he will improve with time and symptomatic tx.   1. LE edema - not likely 2/2 vol overload. Has not been as active and likely just 2/2 CVI. Dopplers were negative for DVT  2. URI sxs - likely not 2/2 vol overload but rather a virus. Cough suppressant since that is his worst sxs.  3. AKI - He is not pre-renal and not worsening on lasix. Pt doesn't describe post-obstructive sxs. His Cr baseline quite variable. If cont to trend up, will need to stop lasix and W/U further.  4. Gait abnl since Feb - intpt PT and OT. Recs outpt services. He will see ortho as outpt for R knee pain.  Bartholomew Crews, MD 5/11/201911:59 AM

## 2018-04-01 NOTE — Progress Notes (Signed)
Patient resting comfortably during shift report. Denies complaints.  

## 2018-04-01 NOTE — Progress Notes (Signed)
*  PRELIMINARY RESULTS* Vascular Ultrasound Bilateral lower extremity venous duplex has been completed.  Preliminary findings: No evidence of deep vein thrombosis in the visualized veins or baker's cysts bilaterally.  Pitting edema in the calf bilaterally.   Everrett Coombe 04/01/2018, 11:41 AM

## 2018-04-01 NOTE — Evaluation (Signed)
Occupational Therapy Evaluation Patient Details Name: Charles [name] MRN: [id number] DOB: 04/14/1946 Today's Date: 04/01/2018    History of Present Illness 72 yo male with complex cardiac history including combined diastolic and systolic HF (61-60%), PAF on warfarin, CAD s/p CABG (2017), pacemaker 2/2 sick sinus syndrome, OSA unable to tolerate CPAP recently, HTN, type 2 DM, and chronic venous insufficiency presenting with worsening lower extremity swelling, dyspnea on exertion, and cough. The patient states that approximately 4-5 days ago he began noticing that his lower extremities were very swollen and weeping. Around the same time he began experiencing severe dyspnea   Clinical Impression   PTA, pt was living with his wife who has been assisting with dressing since recent falling in Feb injuring his right shoulder (dominant UE). Pt currently requiring Mod A for bathing/dressing and Min Guard A for functional mobility. Pt presenting with decreased endurance, functional use of RUE, and activity tolerance. Pt reporting he has outpatient therapy set up with [facility] to address deficits at right shoulder and knee. Pt would benefit from further acute OT to facilitate safe dc. Recommend dc to home with follow up at outpatient OT to optimize safety, independence with ADLs, decrease caregiver burden, and return to PLOF.      Follow Up Recommendations  Outpatient OT;Supervision - Intermittent    Equipment Recommendations  None recommended by OT    Recommendations for Other Services PT consult     Precautions / Restrictions Precautions Precautions: Fall Restrictions Weight Bearing Restrictions: No      Mobility Bed Mobility               General bed mobility comments: OOB upon arrival  Transfers Overall transfer level: Needs assistance Equipment used: None Transfers: Sit to/from Stand Sit to Stand: Min guard         General transfer comment: MIn Guard for  safety    Balance                                           ADL either performed or assessed with clinical judgement   ADL Overall ADL's : Needs assistance/impaired Eating/Feeding: Set up;Sitting Eating/Feeding Details (indicate cue type and reason): Pt using left hand for majority of self feeding due to limited ROM and funcitonal use of RUE Grooming: Oral care;Supervision/safety;Standing;Set up Grooming Details (indicate cue type and reason): Pt performing oral care at sink with supervision and set up at sink. Pt using LUE to perform oral care. Upper Body Bathing: Moderate assistance;Sitting   Lower Body Bathing: Moderate assistance;Sit to/from stand   Upper Body Dressing : Moderate assistance;Sitting Upper Body Dressing Details (indicate cue type and reason): Decreased ROM of right shoulder. Donning second gown like jacket.  Lower Body Dressing: Moderate assistance;Sit to/from stand Lower Body Dressing Details (indicate cue type and reason): Decreased ROM to adjust socks. Requiring assistance.  Toilet Transfer: Min guard;Ambulation(Simulated to recliner)           Functional mobility during ADLs: Min guard General ADL Comments: Pt with decreased funcitonal performance due to poor endurance, decreased activity tolerance, and poor funcitonal use of RUE.      Vision Baseline Vision/History: Wears glasses Wears Glasses: At all times Patient Visual Report: No change from baseline       Perception     Praxis      Pertinent Vitals/Pain Pain Assessment: 0-10 Pain Score: 3  Pain Location: R knee; R shoulder increased pain with movement Pain Descriptors / Indicators: Constant;Discomfort Pain Intervention(s): Monitored during session     Hand Dominance Right   Extremity/Trunk Assessment Upper Extremity Assessment Upper Extremity Assessment: RUE deficits/detail RUE Deficits / Details: Shoulder injury after fall in Feb. Pt with decreased grasp strength.  Limited ROM at elbow and shoulder. Pt unable to bring hand up to mid chest. Pain with movement RUE: Unable to fully assess due to pain RUE Coordination: decreased fine motor;decreased gross motor   Lower Extremity Assessment Lower Extremity Assessment: Defer to PT evaluation;RLE deficits/detail RLE Deficits / Details: R knee injury with decreased ross movement strength and movement   Cervical / Trunk Assessment Cervical / Trunk Assessment: Other exceptions Cervical / Trunk Exceptions: Increased body habitus   Communication Communication Communication: No difficulties   Cognition Arousal/Alertness: Awake/alert Behavior During Therapy: WFL for tasks assessed/performed Overall Cognitive Status: Within Functional Limits for tasks assessed                                     General Comments  SpO2 around 96% on roomair    Exercises     Shoulder Instructions      Home Living Family/patient expects to be discharged to:: Private residence Living Arrangements: Spouse/significant other Available Help at Discharge: Available PRN/intermittently;Family(Wife (who works) and sister lives next door) Type of Home: House Home Access: Level entry     [place]: Two level;Able to live on main level with bedroom/bathroom   Alternate Level Stairs-Rails: Right Bathroom Shower/Tub: Occupational psychologist: Handicapped height Bathroom Accessibility: Yes   Home Equipment: Shower seat - built in;Grab bars - tub/shower;Hand held shower head;Toilet riser;Grab bars - toilet;Cane - single point   Additional Comments: Pt has set up OP PT for right shoulder and right knee      Prior Functioning/Environment Level of Independence: Needs assistance  Gait / Transfers Assistance Needed: Does not use DME. Before fall in Feb, pt was lifting weights and working out regularly. ADL's / Homemaking Assistance Needed: Pt currently requiring asssistance from wife to don shirts due to  limited ROM at shoulder since fall in Feb. Pt able to manage majority of dressing and bathing with increased time and difficulty, Wife occasionally assisting with socks, pants, and underwear.             OT Problem List: Decreased strength;Decreased range of motion;Decreased activity tolerance;Impaired balance (sitting and/or standing);Decreased coordination;Decreased knowledge of use of DME or AE;Decreased knowledge of precautions;Impaired UE functional use;Pain;Increased edema      OT Treatment/Interventions: Self-care/ADL training;Therapeutic exercise;Energy conservation;DME and/or AE instruction;Therapeutic activities;Patient/family education    OT Goals(Current goals can be found in the care plan section) Acute Rehab OT Goals Patient Stated Goal: Go home and start rehab on shoulder OT Goal Formulation: With patient Time For Goal Achievement: 04/15/18 Potential to Achieve Goals: Good ADL Goals Pt Will Perform Upper Body Bathing: with set-up;with supervision;sitting;with adaptive equipment Pt Will Perform Upper Body Dressing: with set-up;with supervision;sitting(using compensatory techniques) Pt Will Perform Lower Body Dressing: with set-up;with supervision;with adaptive equipment;sit to/from stand Pt Will Transfer to Toilet: with set-up;with supervision;regular height toilet;ambulating  OT Frequency: Min 2X/week   Barriers to D/C:            Co-evaluation              AM-PAC PT "6 Clicks" Daily Activity  Outcome Measure Help from another person eating meals?: None Help from another person taking care of personal grooming?: A Little Help from another person toileting, which includes using toliet, bedpan, or urinal?: A Little Help from another person bathing (including washing, rinsing, drying)?: A Lot Help from another person to put on and taking off regular upper body clothing?: A Lot Help from another person to put on and taking off regular lower body clothing?: A  Lot 6 Click Score: 16   End of Session Equipment Utilized During Treatment: Gait belt Nurse Communication: Mobility status  Activity Tolerance: Patient tolerated treatment well;Patient limited by pain;Patient limited by fatigue Patient left: in chair;with call bell/phone within reach;with chair alarm set  OT Visit Diagnosis: Unsteadiness on feet (R26.81);Other abnormalities of gait and mobility (R26.89);Muscle weakness (generalized) (M62.81);Pain;History of falling (Z91.81) Pain - Right/Left: Right Pain - part of body: Shoulder                Time: 7680-8811 OT Time Calculation (min): 23 min Charges:  OT General Charges $OT Visit: 1 Visit OT Evaluation $OT Eval Moderate Complexity: 1 Mod G-Codes:     Jayvien Rowlette MSOT, OTR/L Acute Rehab Pager: [phone] Office: [place] 04/01/2018, 10:11 AM

## 2018-04-01 NOTE — Progress Notes (Signed)
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 The patient states that approximately 4-5 days ago he began noticing that his lower extremities were very swollen and weeping. Around the same time he began experiencing severe dyspnea    Clinical Impression  Pt admitted with above diagnosis. Pt currently with functional limitations due to the deficits listed below (see PT Problem List). PTA, pt living at home with wife, independent with mobility but limited due to injuries from fall in January to shoulder, prior to that was active. Upon eval pt presents with R shoulder pain, R knee pain, mild dyspnea with activity. Ambulating hallway 100' with slight imbalance without AD, satting well on RA. Pt has OP PT set up for his shoulder and encouraged him to discuss HEP to increase activity tolerance as well.     Pt will benefit from skilled PT to increase their independence and safety with mobility to allow discharge to the venue listed below.       Follow Up Recommendations Supervision for mobility/OOB(Patient has OP PT set up already. )    Equipment Recommendations  None recommended by PT    Recommendations for Other Services       Precautions / Restrictions Precautions Precautions: Fall Restrictions Weight Bearing Restrictions: No      Mobility  Bed Mobility               General bed mobility comments: OOB upon arrival  Transfers Overall transfer level: Needs assistance Equipment used: None Transfers: Sit to/from Stand Sit to Stand: Min guard         General transfer comment: MIn Guard for safety  Ambulation/Gait Ambulation/Gait assistance: Min guard Ambulation Distance (Feet): 100 Feet Assistive device: None Gait Pattern/deviations: Step-to pattern;Step-through pattern;Staggering left;Staggering right Gait velocity: decreased   General Gait Details: Patient with mild unsteadiness on feet, pt denies due to knee pain but rather just not feeling well. Coughing alot, O2 sats remain over 90% on RA.   Stairs            Wheelchair Mobility    Modified Rankin (Stroke Patients Only)       Balance Overall balance assessment: Mild deficits observed, not formally tested                                           Pertinent Vitals/Pain Pain Assessment: Faces Pain Score: 3  Faces Pain Scale: Hurts little more Pain Location: R knee; R shoulder increased pain with movement Pain Descriptors / Indicators: Constant;Discomfort Pain Intervention(s): Limited activity within patient's tolerance;Monitored during session;Premedicated before session;Repositioned    Home Living Family/patient expects to be discharged to:: Private residence Living Arrangements: Spouse/significant other Available Help at Discharge: Available PRN/intermittently;Family(wife who works, sister lives next door) Type of Home: House Home Access: Level entry     [place]: Two level;Able to live on main level with bedroom/bathroom Home Equipment: Shower seat - built in;Grab bars - tub/shower;Hand held shower head;Toilet riser;Grab bars - toilet;Cane - single point Additional Comments: Pt  has set up OP PT for right shoulder and right knee    Prior Function Level of Independence: Needs assistance   Gait / Transfers Assistance Needed: Does not use DME. Before fall in Feb, pt was lifting weights and working out regularly.  ADL's / Homemaking Assistance Needed: Pt currently requiring asssistance from wife to don shirts due to limited ROM  at shoulder since fall in Feb. Pt able to manage majority of dressing and bathing with increased time and difficulty, Wife occasionally assisting with socks, pants, and underwear.         Hand Dominance   Dominant Hand: Right    Extremity/Trunk Assessment   Upper Extremity Assessment Upper Extremity Assessment: Defer to OT evaluation RUE Deficits / Details: Shoulder injury after fall in Feb. Pt with decreased grasp strength. Limited ROM at elbow and shoulder. Pt unable to bring hand up to mid chest. Pain with movement RUE: Unable to fully assess due to pain RUE Coordination: decreased fine motor;decreased gross motor    Lower Extremity Assessment Lower Extremity Assessment: (RLE 4-/5 due to R knee pain. LLE 5/5) RLE Deficits / Details: R knee injury with decreased ross movement strength and movement    Cervical / Trunk Assessment Cervical / Trunk Assessment: Other exceptions Cervical / Trunk Exceptions: Increased body habitus  Communication   Communication: No difficulties  Cognition Arousal/Alertness: Awake/alert Behavior During Therapy: WFL for tasks assessed/performed Overall Cognitive Status: Within Functional Limits for tasks assessed                                        General Comments General comments (skin integrity, edema, etc.): SpO2 96% on RA at rest    Exercises     Assessment/Plan    PT Assessment Patient needs continued PT services  PT Problem List Decreased strength;Decreased range of motion;Decreased activity tolerance;Decreased balance;Decreased mobility;Pain       PT Treatment Interventions DME instruction;Gait training;Stair training;Functional mobility training;Therapeutic activities;Therapeutic exercise;Balance training    PT Goals (Current goals can be found in the Care Plan section)  Acute Rehab PT Goals Patient Stated Goal: Go home and start rehab on shoulder PT Goal Formulation: With patient Time For Goal Achievement:  04/15/18 Potential to Achieve Goals: Good    Frequency Min 3X/week   Barriers to discharge        Co-evaluation               AM-PAC PT "6 Clicks" Daily Activity  Outcome Measure Difficulty turning over in bed (including adjusting bedclothes, sheets and blankets)?: A Little Difficulty moving from lying on back to sitting on the side of the bed? : A Little Difficulty sitting down on and standing up from a chair with arms (e.g., wheelchair, bedside commode, etc,.)?: A Little Help needed moving to and from a bed to chair (including a wheelchair)?: A Little Help needed walking in hospital room?: A Little Help needed climbing 3-5 steps with a railing? : A Little 6 Click Score: 18    End of Session Equipment Utilized During Treatment: Gait belt Activity Tolerance: Patient tolerated treatment well Patient left: with call bell/phone within reach;in chair;with nursing/sitter in room Nurse Communication: Mobility status PT Visit Diagnosis: Unsteadiness on feet (R26.81);Pain Pain - Right/Left: Right Pain - part of body: Shoulder;Knee    Time: 8101-7510 PT Time Calculation (min) (ACUTE ONLY): 20 min   Charges:   PT Evaluation $PT  Eval Moderate Complexity: 1 Mod     PT G Codes:       Reinaldo Berber, PT, DPT Acute Rehab Services Pager: [phone]    Reinaldo Berber 04/01/2018, 10:27 AM

## 2018-04-01 NOTE — Progress Notes (Signed)
  Echocardiogram 2D Echocardiogram has been performed.  Charles [name] T Ambar Raphael 04/01/2018, 10:48 AM

## 2018-04-01 NOTE — Progress Notes (Signed)
[place] for warfarin Indication: atrial fibrillation  Allergies  Allergen Reactions  . Chocolate Hives, Shortness Of Breath and Swelling  . Statins Other (See Comments)    Mental changes, muscle aches  . Black Pepper [[place]]     Irritates back of throat  . Codeine Itching  . Oxytetracycline Other (See Comments)    Flushing in sunlight  . Tape Rash and Other (See Comments)    SKIN IS VERY SENSITIVE!!    Patient Measurements: Height: 5\' 9"  (175.3 cm) Weight: 271 lb 1.6 oz (123 kg)(scale b) IBW/kg (Calculated) : 70.7  Vital Signs: Temp: 97.6 F (36.4 C) (05/11 0424) Temp Source: Oral (05/11 0424) BP: 113/80 (05/11 0424) Pulse Rate: 70 (05/11 0424)  Labs: Recent Labs    03/31/18 1110 03/31/18 1917 04/01/18 0445 04/01/18 0946  HGB 11.1*  --   --   --   HCT 35.6*  --   --   --   PLT 419*  --   --   --   LABPROT  --  26.4* 27.2*  --   INR  --  2.46 2.54  --   CREATININE 1.50*  --  1.56*  --   TROPONINI  --   --   --  <0.03    Estimated Creatinine Clearance: 56.3 mL/min (A) (by C-G formula based on SCr of 1.56 mg/dL (H)).  Assessment: 73 yom with hx of afib on warfarin pta. Pharmacy consulted to dose inpatient. INR 2.46 on admit, now 2.54. Hg 11.1, plt 419 on admit. No bleed documented. MD with low suspicion for dvt but ordering dopplers to r/o.  PTA dose: 7.5mg  on Mon, 5mg  all other days (last dose 5/9 pta) per discussion with patient  Goal of Therapy:  INR 2-3 Monitor platelets by anticoagulation protocol: Yes   Plan:  Warfarin 5mg  PO x 1 dose Monitor daily INR, CBC, s/sx bleeding  Elicia Lamp, PharmD, BCPS Clinical Pharmacist Clinical phone for 04/01/2018 until 3:30pm: [phone] If after 3:30pm, please call main pharmacy at: [phone] 04/01/2018 11:57 AM

## 2018-04-02 ENCOUNTER — Inpatient Hospital Stay (HOSPITAL_COMMUNITY): Payer: BLUE CROSS/BLUE SHIELD

## 2018-04-02 DIAGNOSIS — R609 Edema, unspecified: Secondary | ICD-10-CM

## 2018-04-02 DIAGNOSIS — Z955 Presence of coronary angioplasty implant and graft: Secondary | ICD-10-CM

## 2018-04-02 DIAGNOSIS — I5043 Acute on chronic combined systolic (congestive) and diastolic (congestive) heart failure: Secondary | ICD-10-CM

## 2018-04-02 DIAGNOSIS — R059 Cough, unspecified: Secondary | ICD-10-CM

## 2018-04-02 DIAGNOSIS — R05 Cough: Secondary | ICD-10-CM

## 2018-04-02 LAB — PROTIME-INR
INR: 2.52
Prothrombin Time: 26.9 seconds — ABNORMAL HIGH (ref 11.4–15.2)

## 2018-04-02 LAB — GLUCOSE, CAPILLARY
Glucose-Capillary: 157 mg/dL — ABNORMAL HIGH (ref 65–99)
Glucose-Capillary: 226 mg/dL — ABNORMAL HIGH (ref 65–99)
Glucose-Capillary: 330 mg/dL — ABNORMAL HIGH (ref 65–99)
Glucose-Capillary: 170 mg/dL — ABNORMAL HIGH (ref 65–99)

## 2018-04-02 LAB — BASIC METABOLIC PANEL
Anion gap: 11 (ref 5–15)
BUN: 44 mg/dL — AB (ref 6–20)
CALCIUM: 8.6 mg/dL — AB (ref 8.9–10.3)
CO2: 23 mmol/L (ref 22–32)
CREATININE: 1.43 mg/dL — AB (ref 0.61–1.24)
Chloride: 103 mmol/L (ref 101–111)
GFR calc Af Amer: 55 mL/min — ABNORMAL LOW (ref >60)
GFR calc non Af Amer: 48 mL/min — ABNORMAL LOW (ref >60)
GLUCOSE: 168 mg/dL — AB (ref 65–99)
Potassium: 3.6 mmol/L (ref 3.5–5.1)
Sodium: 137 mmol/L (ref 135–145)

## 2018-04-02 MED ORDER — WARFARIN SODIUM 5 MG PO TABS: 5.0000 mg | ORAL_TABLET | Freq: Once | ORAL | Status: DC

## 2018-04-02 MED ORDER — AZITHROMYCIN 250 MG PO TABS
250.0000 mg | ORAL_TABLET | Freq: Every day | ORAL | Status: DC
  Administered 2018-04-03 – 2018-04-04 (×2): 250 mg via ORAL
  Filled 2018-04-02 (×2): qty 1

## 2018-04-02 MED ORDER — METOLAZONE 2.5 MG PO TABS
2.5000 mg | ORAL_TABLET | Freq: Every day | ORAL | Status: DC
  Administered 2018-04-02 – 2018-04-04 (×3): 2.5 mg via ORAL
  Filled 2018-04-02 (×3): qty 1

## 2018-04-02 MED ORDER — CEFTRIAXONE SODIUM 1 G IJ SOLR
1.0000 g | INTRAMUSCULAR | Status: DC
  Administered 2018-04-02 – 2018-04-03 (×2): 1 g via INTRAVENOUS
  Filled 2018-04-02 (×2): qty 10

## 2018-04-02 MED ORDER — DIPHENHYDRAMINE HCL 25 MG PO CAPS
25.0000 mg | ORAL_CAPSULE | Freq: Two times a day (BID) | ORAL | Status: DC
  Administered 2018-04-02 – 2018-04-03 (×2): 25 mg via ORAL
  Filled 2018-04-02 (×2): qty 1

## 2018-04-02 MED ORDER — PHENOL 1.4 % MT LIQD
1.0000 | OROMUCOSAL | Status: DC | PRN
  Filled 2018-04-02: qty 177

## 2018-04-02 MED ORDER — GUAIFENESIN-DM 100-10 MG/5ML PO SYRP
5.0000 mL | ORAL_SOLUTION | ORAL | Status: DC | PRN
  Administered 2018-04-02 – 2018-04-03 (×5): 5 mL via ORAL
  Filled 2018-04-02 (×5): qty 5

## 2018-04-02 MED ORDER — CODEINE SULFATE 15 MG PO TABS
30.0000 mg | ORAL_TABLET | Freq: Four times a day (QID) | ORAL | Status: AC
  Administered 2018-04-02 – 2018-04-03 (×2): 30 mg via ORAL
  Filled 2018-04-02 (×2): qty 2

## 2018-04-02 MED ORDER — FUROSEMIDE 10 MG/ML IJ SOLN
40.0000 mg | Freq: Two times a day (BID) | INTRAMUSCULAR | Status: DC
  Administered 2018-04-02: 40 mg via INTRAVENOUS
  Filled 2018-04-02: qty 4

## 2018-04-02 MED ORDER — WARFARIN SODIUM 2.5 MG PO TABS
2.5000 mg | ORAL_TABLET | Freq: Once | ORAL | Status: AC
  Administered 2018-04-02: 2.5 mg via ORAL
  Filled 2018-04-02: qty 1

## 2018-04-02 MED ORDER — AZITHROMYCIN 500 MG PO TABS
500.0000 mg | ORAL_TABLET | Freq: Every day | ORAL | Status: AC
  Administered 2018-04-02: 500 mg via ORAL
  Filled 2018-04-02: qty 1

## 2018-04-02 MED ORDER — FUROSEMIDE 10 MG/ML IJ SOLN: 80.0000 mg | Freq: Two times a day (BID) | INTRAMUSCULAR | Status: DC

## 2018-04-02 NOTE — Progress Notes (Signed)
Progress Note  Patient Name: Charles [name] Date of Encounter: 04/02/2018  Primary Cardiologist: Shelva Majestic, MD   Subjective   The patient continues to have constant cough that prevents him from laying down.  Inpatient Medications    Scheduled Meds: . alfuzosin  10 mg Oral Q breakfast  . aspirin EC  81 mg Oral Daily  . benzonatate  100 mg Oral TID  . feeding supplement (ENSURE ENLIVE)  237 mL Oral BID BM  . furosemide  40 mg Intravenous BID  . gabapentin  300 mg Oral TID  . guaiFENesin  600 mg Oral BID  . insulin aspart  0-15 Units Subcutaneous TID WC  . insulin glargine  30 Units Subcutaneous Daily  . ipratropium-albuterol  3 mL Nebulization TID  . losartan  25 mg Oral Daily  . pantoprazole  40 mg Oral Daily  . rosuvastatin  7.5 mg Oral q1800  . sotalol  120 mg Oral BID  . warfarin  5 mg Oral ONCE-1800  . Warfarin - Pharmacist Dosing Inpatient   Does not apply q1800   Continuous Infusions:  PRN Meds: acetaminophen **OR** acetaminophen, albuterol, guaiFENesin-dextromethorphan, phenol   Vital Signs    Vitals:   04/01/18 2024 04/01/18 2353 04/02/18 0511 04/02/18 0927  BP:  114/64 111/79   Pulse:  69 69   Resp:  20 20   Temp:  97.7 F (36.5 C) (!) 97.5 F (36.4 C)   TempSrc:  Oral Oral   SpO2: 94% 95% 98% 95%  Weight:   271 lb 8 oz (123.2 kg)   Height:        Intake/Output Summary (Last 24 hours) at 04/02/2018 1158 Last data filed at 04/02/2018 0800 Gross per 24 hour  Intake 1630 ml  Output 1225 ml  Net 405 ml   Filed Weights   03/31/18 1858 04/01/18 0424 04/02/18 0511  Weight: 271 lb 11.2 oz (123.2 kg) 271 lb 1.6 oz (123 kg) 271 lb 8 oz (123.2 kg)    Telemetry    A paced rhythm - Personally Reviewed  ECG    A paced rhythm with LBBB - Personally Reviewed  Physical Exam   GEN: No acute distress.   Neck: No JVD Cardiac: RRR, no murmurs, rubs, or gallops.  Respiratory: Clear to auscultation bilaterally. GI: Soft, nontender, non-distended    MS: No edema; No deformity. Neuro:  Nonfocal  Psych: Normal affect   Labs    Chemistry Recent Labs  Lab 03/31/18 1110 04/01/18 0445 04/02/18 0630  NA 142 137 137  K 4.3 3.9 3.6  CL 108 103 103  CO2 18* 20* 23  GLUCOSE 131* 172* 168*  BUN 33* 40* 44*  CREATININE 1.50* 1.56* 1.43*  CALCIUM 8.8* 8.5* 8.6*  PROT  --  7.5  --   ALBUMIN  --  2.4*  --   AST  --  32  --   ALT  --  53  --   ALKPHOS  --  202*  --   BILITOT  --  0.7  --   GFRNONAA 45* 43* 48*  GFRAA 52* 50* 55*  ANIONGAP 16* 14 11     Hematology Recent Labs  Lab 03/31/18 1110  WBC 9.9  RBC 4.00*  HGB 11.1*  HCT 35.6*  MCV 89.0  MCH 27.8  MCHC 31.2  RDW 15.3  PLT 419*    Cardiac Enzymes Recent Labs  Lab 04/01/18 0946  TROPONINI <0.03    Recent Labs  Lab 03/31/18  1035  TROPIPOC 0.02     BNP Recent Labs  Lab 03/31/18 1110  BNP 203.4*     DDimer No results for input(s): DDIMER in the last 168 hours.   Radiology    No results found.  Cardiac Studies   TTE: 08/03/2016 - Left ventricle: Abnormal septal motion The cavity size was mildly   dilated. Wall thickness was increased in a pattern of severe LVH.   Systolic function was mildly reduced. The estimated ejection   fraction was in the range of 45% to 50%. Wall motion was normal;   there were no regional wall motion abnormalities. Doppler   parameters are consistent with abnormal left ventricular   relaxation (grade 1 diastolic dysfunction). - Left atrium: The atrium was mildly dilated. - Atrial septum: No defect or patent foramen ovale was identified.  TTE: 04/01/2018 - Left ventricle: Septal and apical akinesis inferior wall   hypokinesis. The cavity size was moderately dilated. Wall   thickness was increased in a pattern of moderate LVH. The   estimated ejection fraction was 30%. Left ventricular diastolic   function parameters were normal. - Aortic valve: Valve area (Vmax): 2.2 cm^2. - Mitral valve: Calcified annulus.  Mildly thickened leaflets .   There was moderate regurgitation. - Left atrium: The atrium was moderately dilated. - Atrial septum: No defect or patent foramen ovale was identified.    Patient Profile     72 y.o. male obese white male with a history of CAD, hypertension, hyperlipidemia, diabetes mellitus, sick sinus syndrome status post permanent pacemaker placement and PAF on chronic Coumadin therapy, s/p PCI's in the past and then underwent CABG in 2017 with LIMA to the LAD, SVG to diagonal, SVG to OM, SVG to PDA.  He has been participating in the maintenance cardiac rehab program up until February when he fell down 3 steps and injured his back.  Since then he has been very sedentary and basically sits in a chair all day. He has chronic lower extremity edema that usually resolves when he takes a 20 mg Lasix tablet or when he notices that he is getting increasing weight gain in his abdomen or shortness of breath.  This is been very stable.  About 2 weeks ago he caught a URI from his sister and has had a cough with shortness of breath since then.  Over the past few days he has noted increasing lower extremity edema as well as some orthopnea and has been sleeping in a recliner.  He says over the past few days his legs have started to seep clear fluid.  He admits to following a strict low-sodium diet and has been compliant with his medications.  Assessment & Plan    1.  Acute on chronic combined systolic/diastolic heart failure [place] Association class III.   -His office weight from 12/28/2017 was 294 pounds, on admission 273 pounds today , today 271 lbs -I suspect his shortness of breath and cough are more related to URI than CHF. BNP 203, He does have marked lower extremity edema but this is likely due to recent sedentary state with immobility in setting of chronic venous insufficiency.  He is also morbidly obese. -He was given Lasix 40 mg IV in the ER and will start Lasix 40 mg IV twice daily,  minimal diuresis, weight stable.  -Renal function is stable, daily weights and I's and O's closely with diuresis -His INR is therapeutic but I am still going to get bilateral lower extremity venous  Dopplers given his recent sedentary state and marked lower extremity edema  2. LE edema - appears dependant from not being able to lay flat, I will suggest wrapping as compressions socks are too tight, also I will give one dose of metolazone.  Elevate legs.  3. URI  - cought, start benadryl and codeine, the patient has constant ongoing cough preventing him from talking and laying down  3. ASCAD status post CABG in 2017 -He denies any anginal symptoms -Continue ASA 81 mg daily, Crestor 7.5 mg daily  4. New decrease in LVEF  - prior LVEF prior to CABG, we dont know post surgical LVEF - He has LBBB, he might benefit from PM upgrade to BiV  - no chest pain, I would not proceed with ischemic workup right now  5.  Hyperlipidemia with LDL goal less than 70 -Continue Crestor 7.5 mg daily -Check FLP in a.m.  6.  Paroxysmal atrial fibrillation -He is atrial paced on EKG today -Continue sotalol120 mg twice daily and Coumadin -CHADS2VASC score is 4 -INR is therapeutic at 2.46  6.  Sick sinus syndrome status post permanent pacemaker placement -Followed in device clinic  7.  Hypertension -BP is stable on exam at 120/66 mmHg -Continue losartan 25 mg daily  For questions or updates, please contact [place] Please consult [email or web address] for contact info under Cardiology/STEMI.      Signed, Ena Dawley, MD  04/02/2018, 11:58 AM

## 2018-04-02 NOTE — Progress Notes (Addendum)
[place] for warfarin Indication: atrial fibrillation  Allergies  Allergen Reactions  . Chocolate Hives, Shortness Of Breath and Swelling  . Statins Other (See Comments)    Mental changes, muscle aches  . Black Pepper [[place]]     Irritates back of throat  . Codeine Itching  . Oxytetracycline Other (See Comments)    Flushing in sunlight  . Tape Rash and Other (See Comments)    SKIN IS VERY SENSITIVE!!    Patient Measurements: Height: 5\' 9"  (175.3 cm) Weight: 271 lb 8 oz (123.2 kg)(scale b) IBW/kg (Calculated) : 70.7  Vital Signs: Temp: 97.5 F (36.4 C) (05/12 0511) Temp Source: Oral (05/12 0511) BP: 111/79 (05/12 0511) Pulse Rate: 69 (05/12 0511)  Labs: Recent Labs    03/31/18 1110 03/31/18 1917 04/01/18 0445 04/01/18 0946 04/02/18 0630  HGB 11.1*  --   --   --   --   HCT 35.6*  --   --   --   --   PLT 419*  --   --   --   --   LABPROT  --  26.4* 27.2*  --  26.9*  INR  --  2.46 2.54  --  2.52  CREATININE 1.50*  --  1.56*  --  1.43*  TROPONINI  --   --   --  <0.03  --     Estimated Creatinine Clearance: 61.5 mL/min (A) (by C-G formula based on SCr of 1.43 mg/dL (H)).  Assessment: 89 yom with hx of afib on warfarin pta. Pharmacy consulted to dose inpatient. INR 2.46 on admit, remains therapeutic. Hg 11.1, plt 419. No bleed documented. Dopplers negative for DVT.  PTA dose: 7.5mg  on Mon, 5mg  all other days (last dose 5/9 pta) per discussion with patient  Goal of Therapy:  INR 2-3 Monitor platelets by anticoagulation protocol: Yes   Plan:  Warfarin 5mg  PO x 1 dose  Monitor daily INR, CBC, s/sx bleeding  Elicia Lamp, PharmD, BCPS Clinical Pharmacist Clinical phone for 04/02/2018 until 3:30pm: [phone] If after 3:30pm, please call main pharmacy at: [phone] 04/02/2018 11:14 AM   ADDENDUM:  Patient being started on azithromycin. Will reduce warfarin dose tonight to 2.5mg  PO x 1 in anticipation of INR bump due to interaction  with azithro - day #1/5.  Elicia Lamp, PharmD, BCPS Clinical Pharmacist 04/02/2018 4:47 PM

## 2018-04-02 NOTE — Progress Notes (Signed)
Subjective: Patient continued to experience cough and unable to sleep in his bed.  According to him cough medicine helped to the point that he was able to get some sleep in recliner with his legs hanging causing worsening of his lower extremity edema.  He denies any shortness of breath.  He was with nasal cannula and oxygen opened up at 2 L-saturating in high 90s.  Patient is not on any oxygen at home, uses CPAP at night. According to patient this oxygen supplement is helping with his cough?  Objective:  Vital signs in last 24 hours: Vitals:   04/01/18 2024 04/01/18 2353 04/02/18 0511 04/02/18 0927  BP:  114/64 111/79   Pulse:  69 69   Resp:  20 20   Temp:  97.7 F (36.5 C) (!) 97.5 F (36.4 C)   TempSrc:  Oral Oral   SpO2: 94% 95% 98% 95%  Weight:   271 lb 8 oz (123.2 kg)   Height:       General.  Well-developed gentleman, sitting comfortably in chair, in no acute distress. Lungs.  Coarse breath sounds along with bilateral scratchy breath sounds. CV.  Regular rate and rhythm. Abdomen.  Soft, nontender, bowel sounds positive. Extremities.  3+ pitting edema bilaterally up to knees-seems little worse as compared to yesterday.  Assessment/Plan:  Acute on Chronic Combined Systolic and Diastolic CHF-Lower Extremity Edema.  Echo shows worsening of heart failure with ejection fraction of 30% with septal and inferior akinesia-declined as compared to the previous echo done in 2017 when it was 45 to 50%. Patient has not history of rapid  restenosis of his stents and had a CABG done in October 2017. He denies any chest pain or shortness of breath. Worsening of lower extremity edema seems more dependent as patient stays in chair the whole night with legs hanging. Not sure whether he really needs oxygen or using it just for comfort, he was saturating in high 90s on 2 L which remain the same after decreasing it to 1 L. Wt. remained stable with stable renal function. Hepatic function with low  albumin but no proteinuria. -Might need another cardiac catheterization for further evaluation of his graft. -Ambulate patient with pulse ox. -Continue Lasix 40 mg twice daily -Compression stockings and keeping legs elevated. -Daily BMP to monitor renal function with diuresis  -Cardiology saw him in the ED but no follow-up note yesterday, we will reconsult them regarding changes in his recent echo.  Cough/Viral upper respiratory tract infection.  He has an history of positive contact with URI.  His chest x-ray was normal but breath sounds are quite coarse with scratchy sounds scattered all over. He denies any GERD symptoms. Breathing treatment does not help with his cough much. -Tessalon with little help. -We will continue supportive therapy at this time. -He might need chest CT and formal lung function evaluation if his cough persist for another 1 to 2 weeks. -We will repeat chest x-ray today.  Elevated Creatinine, AKI Patient's baseline creatinine ranges from 1.2-1.35. Creatinine continued to improve, today it was 1.43, BUN mildly elevated as compared to yesterday from 40 to 44.  PAF Currently in NSR, atrial paced and rate controlled.  -Warfarin per pharmacy -PT/INR - therapeutic  -Continue Sotalol 120 mg BID   Type 2 DM  Most recent Hemoglobin A1C 6.9. Currently meeting inpatient goals, AM fasting BG 157.  -CBG monitoring TID AC and nightly  -Lantus 30 units daily  -SSI moderate coverage   CAD s/p  CABG EKG without ischemic changes,  troponin negative. No active CP.  There is some concern for restenosis of his graft because of his worsening ejection fraction. -Continue ASA 81 mg  -Continue Crestor   HTN Normotensive.  -Continue Losartan 25 mg daily   OSA Unable to tolerate to CPAP due to cough.  -CPAP at night ordered again to see if patient can tolerate it.  GERD -Continue Protonix 40 mg     Dispo: Anticipated discharge in approximately 1-2 day(s).    Lorella Nimrod, MD 04/02/2018, 10:05 AM Pager: [phone]

## 2018-04-02 NOTE — Progress Notes (Signed)
Patient ambulated in hallway on room air.  Oxygen saturation 93-94% while walking however patient states he feels short of breath.  Will continue to monitor.

## 2018-04-03 DIAGNOSIS — J189 Pneumonia, unspecified organism: Secondary | ICD-10-CM

## 2018-04-03 DIAGNOSIS — R05 Cough: Secondary | ICD-10-CM

## 2018-04-03 LAB — CBC
HEMATOCRIT: 35.9 % — AB (ref 39.0–52.0)
HEMOGLOBIN: 11.4 g/dL — AB (ref 13.0–17.0)
MCH: 27.6 pg (ref 26.0–34.0)
MCHC: 31.8 g/dL (ref 30.0–36.0)
MCV: 86.9 fL (ref 78.0–100.0)
Platelets: 399 10*3/uL (ref 150–400)
RBC: 4.13 MIL/uL — AB (ref 4.22–5.81)
RDW: 15.3 % (ref 11.5–15.5)
WBC: 10.2 10*3/uL (ref 4.0–10.5)

## 2018-04-03 LAB — BASIC METABOLIC PANEL
ANION GAP: 10 (ref 5–15)
ANION GAP: 12 (ref 5–15)
BUN: 50 mg/dL — ABNORMAL HIGH (ref 6–20)
BUN: 54 mg/dL — AB (ref 6–20)
CHLORIDE: 96 mmol/L — AB (ref 101–111)
CO2: 25 mmol/L (ref 22–32)
CO2: 28 mmol/L (ref 22–32)
Calcium: 8.6 mg/dL — ABNORMAL LOW (ref 8.9–10.3)
Calcium: 8.8 mg/dL — ABNORMAL LOW (ref 8.9–10.3)
Chloride: 103 mmol/L (ref 101–111)
Creatinine, Ser: 1.53 mg/dL — ABNORMAL HIGH (ref 0.61–1.24)
Creatinine, Ser: 1.69 mg/dL — ABNORMAL HIGH (ref 0.61–1.24)
GFR calc Af Amer: 51 mL/min — ABNORMAL LOW (ref >60)
GFR calc Af Amer: 45 mL/min — ABNORMAL LOW (ref >60)
GFR calc non Af Amer: 39 mL/min — ABNORMAL LOW (ref >60)
GFR, EST NON AFRICAN AMERICAN: 44 mL/min — AB (ref >60)
GLUCOSE: 137 mg/dL — AB (ref 65–99)
Glucose, Bld: 292 mg/dL — ABNORMAL HIGH (ref 65–99)
POTASSIUM: 4.1 mmol/L (ref 3.5–5.1)
POTASSIUM: 4.2 mmol/L (ref 3.5–5.1)
SODIUM: 136 mmol/L (ref 135–145)
Sodium: 138 mmol/L (ref 135–145)

## 2018-04-03 LAB — GLUCOSE, CAPILLARY
GLUCOSE-CAPILLARY: 244 mg/dL — AB (ref 65–99)
GLUCOSE-CAPILLARY: 204 mg/dL — AB (ref 65–99)
Glucose-Capillary: 144 mg/dL — ABNORMAL HIGH (ref 65–99)
Glucose-Capillary: 290 mg/dL — ABNORMAL HIGH (ref 65–99)

## 2018-04-03 LAB — MAGNESIUM: MAGNESIUM: 2.1 mg/dL (ref 1.7–2.4)

## 2018-04-03 LAB — PROTIME-INR
INR: 2.39
Prothrombin Time: 25.9 seconds — ABNORMAL HIGH (ref 11.4–15.2)

## 2018-04-03 MED ORDER — FUROSEMIDE 10 MG/ML IJ SOLN
80.0000 mg | Freq: Two times a day (BID) | INTRAMUSCULAR | Status: DC
  Administered 2018-04-03 – 2018-04-04 (×3): 80 mg via INTRAVENOUS
  Filled 2018-04-03 (×3): qty 8

## 2018-04-03 MED ORDER — POLYETHYLENE GLYCOL 3350 17 G PO PACK
17.0000 g | PACK | Freq: Every day | ORAL | Status: DC
  Administered 2018-04-03 – 2018-04-04 (×2): 17 g via ORAL
  Filled 2018-04-03 (×2): qty 1

## 2018-04-03 MED ORDER — SENNA 8.6 MG PO TABS
1.0000 | ORAL_TABLET | Freq: Every day | ORAL | Status: DC
  Administered 2018-04-03 – 2018-04-04 (×2): 8.6 mg via ORAL
  Filled 2018-04-03 (×2): qty 1

## 2018-04-03 MED ORDER — WARFARIN SODIUM 3 MG PO TABS
6.0000 mg | ORAL_TABLET | Freq: Once | ORAL | Status: AC
  Administered 2018-04-03: 6 mg via ORAL
  Filled 2018-04-03: qty 2

## 2018-04-03 NOTE — Plan of Care (Signed)
  Problem: Activity: Goal: Risk for activity intolerance will decrease Outcome: Progressing   Problem: Elimination: Goal: Will not experience complications related to bowel motility Outcome: Progressing  On call paged to make aware. Awaiting orders.

## 2018-04-03 NOTE — Progress Notes (Signed)
   Subjective:  Patient seen and examined. Cough is still bothering him. He is still unable to sleep in bed and has to sleep in the bedside chair. Denies CP. Wearing oxygen for comfort only.   Objective:  Vital signs in last 24 hours: Vitals:   04/02/18 2005 04/02/18 2053 04/02/18 2357 04/03/18 0554  BP: 104/60  117/67 122/67  Pulse: 70  70 69  Resp: 20   20  Temp: 97.7 F (36.5 C)   (!) 97.5 F (36.4 C)  TempSrc: Oral   Oral  SpO2: 97% 97%  99%  Weight:    272 lb 11.2 oz (123.7 kg)  Height:       General: Sitting in chair comfortably, NAD HEENT: [place]/AT, no JVD Cardiac: RRR, No R/M/G appreciated Pulm: normal effort, diffuse course breath sounds with expiratory hum in all lung fields Abd: soft, non tender, non distended, BS normal Ext: wrapped in ace bandages  Neuro: alert and oriented X3, cranial nerves II-XII grossly intact   Assessment/Plan:  Acute on Chronic Combined Systolic and Diastolic CHF Lower Extremity Edema Echo shows worsening of heart failure with ejection fraction of 30% with septal and inferior akinesia-declined as compared to the previous echo done in 2017 EF 45 to 50%. Remains net negative >2 liters, negative 1.7 liters within the last 24 hours. Weight 272 today. Creatinine remains elevated from baseline at 1.53, increased from 1.43 yesterday.  -Cardiology on - increasing Lasix to 80 mg IV BID -Metolazone 2.5 mg daily  -Strict I/Os -Daily Weights  -Daily BMP to monitor renal function and lytes  Community Acquired Pneumonia  Repeat CXR revealed bibasilar patchy densities consistent with pneumonia. Day 2 of antibiotics. Cough seems to be improving objectively, though subjectively patient does not notice significant improvement. Lung exam improved with diffuse coarse breath sounds and expiratory hum. Vitals are stable, he remains afebrile without leukocytosis.  -Continue ceftriaxone and azithromycin  -Will transition to PO abx when patient ready for discharge     Elevated Creatinine, AKI Patient's baseline creatinine ranges from 1.2-1.35. Creatinine remains elevated 1.53, BUN mildly elevated as compared to yesterday from 44 to 50. -Daily BMP  PAF Currently in NSR, atrial paced and rate controlled.  -Warfarin per pharmacy -PT/INR - therapeutic  -Continue Sotalol 120 mg BID   Type 2 DM  Most recent Hemoglobin A1C 6.9. Currently meeting inpatient goals, AM fasting BG 137.  -CBG monitoring TID AC and nightly  -Lantus 30 units daily  -SSI moderate coverage   CAD s/p CABG No active CP or anginal symptoms. Cardiology does not feel patient requires ischemic evaluation at this time.  -Continue ASA 81 mg  -Continue Crestor   HTN Normotensive.  -Continue Losartan 25 mg daily   OSA Unable to tolerate to CPAP due to cough.  -CPAP at night ordered again to see if patient can tolerate it  GERD -Continue Protonix 40 mg     Dispo: Anticipated discharge in approximately 1-2 day(s).   Melanee Spry, MD 04/03/2018, 6:35 AM Pager: [phone]

## 2018-04-03 NOTE — Progress Notes (Signed)
RT NOTE:  Pt refuses CPAP tonight. He wears @ home but does not wish to wear one here.

## 2018-04-03 NOTE — Progress Notes (Signed)
CCMD called pt had 5 beats of v tach, on RN assessment pt is asymptomatic, PA Barrett with cardiology paged, will continue to monitor.

## 2018-04-03 NOTE — Progress Notes (Signed)
OT Cancellation Note  Patient Details Name: Charles [name] MRN: [id number] DOB: November 22, 1946   Cancelled Treatment:    Reason Eval/Treat Not Completed: Fatigue/lethargy limiting ability to participate;Other (comment). Pt reports that he is very tired/sleepy and feels a little nauseous.  [name] [facility] 04/03/2018, 12:40 PM

## 2018-04-03 NOTE — Discharge Summary (Signed)
Name: Charles [name] MRN: [id number] DOB: 03-29-1946 72 y.o. PCP: Cyndi Bender, PA-C  Date of Admission: 03/31/2018  9:39 AM Date of Discharge:  Attending Physician: Bartholomew Crews, MD  Discharge Diagnosis: 1. Acute on Chronic Diastolic and Systolic Heart Failure Exacerbation 2. 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      Medication List    STOP taking these medications   diazepam 5 MG tablet Commonly known as:  VALIUM     TAKE these medications   alfuzosin 10 MG 24 hr tablet Commonly known as:  UROXATRAL Take 10 mg by mouth daily with breakfast.   ALLERGY RELIEF PO Take 1 tablet by mouth daily as needed (allergies).   amoxicillin-clavulanate 875-125 MG tablet Commonly known as:  AUGMENTIN Take 1 tablet by mouth every 12 (twelve) hours for 3 days. Start taking on:  04/05/2018   aspirin 81 MG EC tablet Take 1 tablet (81 mg total) by mouth daily.   azithromycin 250 MG tablet Commonly known as:  ZITHROMAX Take 1 tablet (250 mg total) by mouth daily for 2 days. Start taking on:  04/05/2018   benzonatate 100 MG capsule Commonly known as:  TESSALON Take 1 capsule (100 mg total) by mouth 3 (three) times daily as needed for cough.   CONTOUR NEXT EZ MONITOR  w/Device Kit Check blood sugar three times a day.   CORICIDIN HBP COUGH/COLD PO Take 1 tablet by mouth daily as needed (allergies).   dapagliflozin propanediol 5 MG Tabs tablet Commonly known as:  FARXIGA Take 5 mg by mouth daily.   furosemide 40 MG tablet Commonly known as:  LASIX Take 1 tablet (40 mg total) by mouth daily. What changed:    when to take this  reasons to take this   gabapentin 300 MG capsule Commonly known as:  NEURONTIN Take 1 capsule (300 mg total) by mouth 3 (three) times daily.   glucose blood test strip Commonly known as:  BAYER CONTOUR NEXT TEST Check blood sugar three times a day.   guaiFENesin 600 MG 12 hr tablet Commonly known as:  MUCINEX Take 600 mg by mouth daily as needed for cough or to loosen phlegm.   insulin aspart 100 UNIT/ML injection Commonly known as:  NOVOLOG Inject 15 units at breakfast, 20 at lunch, and 20-25 at supper   insulin degludec 100 UNIT/ML Sopn FlexTouch Pen Commonly known as:  TRESIBA Inject 0.58 mLs (58 Units total) into the skin daily. What changed:  how much to take   Insulin Syringe-Needle U-100 30G X 1/2" 0.5 ML Misc Commonly known as:  B-D INS SYRINGE 0.5CC/30GX1/2" Use to inject insulin 3 times daily   IRON 27 PO Take 1  tablet by mouth daily.   liver oil-zinc oxide 40 % ointment Commonly known as:  DESITIN Apply topically 3 (three) times daily between meals.   losartan 50 MG tablet Commonly known as:  COZAAR Take 25 mg by mouth daily.   metFORMIN 500 MG 24 hr tablet Commonly known as:  GLUCOPHAGE-XR Take 3 tablets (1,500 mg total) daily with supper by mouth.   nitroGLYCERIN 0.4 MG SL tablet Commonly known as:  NITROSTAT Place 0.4 mg under the tongue as needed. For chest pain   onetouch ultrasoft lancets   pantoprazole 40 MG tablet Commonly known as:  PROTONIX Take 1 tablet (40 mg total) by mouth daily.   polyethylene glycol packet Commonly known as:  MIRALAX / GLYCOLAX Take 17 g by mouth  daily as needed for mild constipation.   promethazine 25 MG tablet Commonly known as:  PHENERGAN Take 1 tablet (25 mg total) by mouth every 6 (six) hours as needed. What changed:    how much to take  reasons to take this   rosuvastatin 5 MG tablet Commonly known as:  CRESTOR Take 1.5 tablets (7.5 mg total) by mouth daily.   sotalol 120 MG tablet Commonly known as:  BETAPACE Take 1 tablet (120 mg total) by mouth 2 (two) times daily.   VENTOLIN HFA 108 (90 Base) MCG/ACT inhaler Generic drug:  albuterol Inhale 2 puffs into the lungs 2 (two) times daily as needed for wheezing or shortness of breath.   warfarin 5 MG tablet Commonly known as:  COUMADIN Take as directed. If you are unsure how to take this medication, talk to your nurse or doctor. Original instructions:  Take 1 to 2 tablets by mouth daily as directed by coumadin clinic       Disposition and follow-up:   [name] was discharged from [facility] in Stable condition.  At the hospital follow up visit please address:  1.   Acute on Chronic Combined Systolic and Diastolic CHF -Repeat ECHO demonstrated ejection fraction of 30% with septal and inferior akinesia-declined as compared to the previous echo done in 2017 EF 45 to 50%.  -Diuresed well with 40 mg IV lasix BID initially and received 1 day of 80 mg of Lasix BID, net negative 3875 cc -Weight at discharge: 269 lbs (admission weight 271) -Discharged on 40 mg Lasix daily - please ensure patient is taking the appropriate dose of lasix and adjust as needed -Will need repeat echocardiogram in future -Repeat BMP and magnesium at follow up  Community Acquired Pneumonia -Repeat CXR revealed bibasilar patchy densities consistent with pneumonia -Received 2 days IV ceftriaxone and 3 days of PO azithromycin  -Discharge on Augmentin 875 mg BID for an additional 3 days  -Discharged on Azithromycin 250 mg for an additional 2 days -Please ensure a  total treatment duration of 5 days and extend antibiotics if clinically warranted   Elevated Creatinine -Patient's baseline creatinine ranges from 1.2-1.35.  -Creatinine elevated 1.67 on day of discharge following diuresis  -Patient will need repeat BMP at hospital follow up to evaluate renal function   Paroxysmal Atrial Fibrillation  -Remained in NSR, atrial paced and rate controlled.  -Remained therapeutic on warfarin while hospitalized -Continued Sotalol 120 mg BID at discharge  Type 2 DM  -Most recent Hemoglobin A1C 6.9 91/15/2019 -Please repeat Hemoglobin A1C at hospital follow up -Resumed home diabetes medications at discharge   Coronary Artery Disease s/p CABG 2017 -Patient had no CP or anginal symptoms while hospitalized -EKG  without acute ischemic changes, troponin negative -Ischemic evaluation for reduced EF was not necessary during hospitalization per Cardiology recommendations  -Continued ASA 81 mgat discharge -Continue Crestor7.5 mg at discharge -Patient to follow up with Cardiologist Dr. Claiborne Billings following hospitalization   HTN -Normotensive duration of hospitalization   -Continued Losartan 25 mg daily at discharge  OSA -Patient declined CPAP during hospitalization -Please evaluate if patient has resumed CPAP use at home    2.  Labs / imaging needed at time of follow-up: BMP, magnesium  3.  Pending labs/ test needing follow-up: none  Follow-up Appointments: Follow-up Information    Cyndi Bender, PA-C. Go on 04/11/2018.   Specialty:  Physician Assistant Why:  _0 :00pm Contact information: 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 Acute on Chronic Combined Systolic and Diastolic CHF Mr. [place] presented to [facility] emergency department with increased lower extremity edema, cough, shortness of breath on exertion.  On day of admission, he was found to be hypoxic at an outpatient orthopedic office visit.  Upon arrival to the emergency department, his vital signs were stable and he did not have a new oxygen requirement. EKG was without ischemic changes and troponin was negative. He was admitted to the internal medicine teaching service for acute on chronic combined systolic and diastolic heart failure exacerbation.  Cardiology was consulted in the emergency department, due to his complex cardiac history.   Repeat transthoracic echocardiogram demonstrated worsening of heart failure with ejection fraction of 30% with septal and inferior akinesia.  This was declined as compared to the previous echo done in 2017  with estimated EF 45 to 50%. Lower extremity dopplers were negative for DVT bilaterally. He was initially diuresed with 40 mg of IV Lasix twice daily.  His diuresis regimen was increased to 80 mg of IV Lasix twice daily per heart failure team recommendations.  He responded well, and was net negative the duration of the hospitalization.  He was discharged with instructions to take 40 mg of Lasix daily and to follow-up with his cardiologist within 1 week of discharge from hospital.  Community Acquired Pneumonia  Mr. Yera also presented with a two-week history of a nonproductive cough.  Chest x-ray on admission was negative for focal opacity, pleural effusion, or significant pulmonary edema.  His cough did not significantly improve with diuresis so repeat chest x-ray was ordered, which demonstrated bibasilar patchy densities consistent with pneumonia.   He was treated with IV ceftriaxone (2 days) and PO azithromycin (3 days) with significant  improvement in his symptoms.  He was discharged with prescriptions for Augmentin and azithromycin to be taken for a total treatment duration of 5 days.   Elevated Creatinine Patient presented with elevated creatinine from baseline. Baseline creatinine ranged from 1.2-1.35. Creatinine was consistently mildly elevated ranging from 1.5-1.67 while being diuresed. On day of discharge creatinine elevated 1.67, BUN 54.  Patient will need repeat BMP hospital follow-up appointment.  Elevated creatinine likely prerenal secondary to diuresis.  Paroxysmal Atrial Fibrillation  Patient remained  in normal sinus rhythm with atrial pacing while hospitalized. Warfarin was managed through inpatient pharmacy, and is INR was therapeutic and monitored daily. Sotalol 120 mg BID was also resumed during hospitalization.   Type 2 DM  Blood glucose levels were monitored and insulin regimen was adjusted accordingly. Managed with daily Lantus and SSI coverage. No changes were made to home medication regimen. Recommend repeat hemoglobin A1C at follow up.   CAD s/p CABG 2017 Mr. Bordas had no chest pain or anginal symptoms while hospitalized. EKG was without ischemic changes and Troponin levels were negative. Cardiology was consulted for consideration of ischemic evaluation in setting of decreased ejection fraction and inferolateral akinesis seen on echocardiogram. Cardiac catheterization was not recommended by cardiology as the patient had no chest pain or anginal symptoms. ASA 81 mgand Crestor7.5 mg were resumed at discharge.   Essential Hypertension Normotensive throughout hospitalization. Losartan 25 mg daily was continued during hospitalization and at discharge.   OSA Patient declined CPAP use while hospitalized. He had difficulty tolerating it with his persistent cough.      Discharge Vitals:   BP (!) 113/58 (BP Location: Right Arm)   Pulse 70   Temp (!) 97.4 F (36.3 C) (Oral)   Resp 20   Ht _0   (1.753 m)   Wt 269 lb 1.6 oz (122.1 kg) Comment: scale b  SpO2 94%   BMI 39.74 kg/m   Pertinent Labs, Studies, and Procedures:  CBC Latest Ref Rng & Units 04/03/2018 03/31/2018 01/03/2017  WBC 4.0 - 10.5 K/uL 10.2 9.9 10.2  Hemoglobin 13.0 - 17.0 g/dL 11.4(L) 11.1(L) 11.9(L)  Hematocrit 39.0 - 52.0 % 35.9(L) 35.6(L) 37.5(L)  Platelets 150 - 400 K/uL 399 419(H) 446(H)   BMP Latest Ref Rng & Units 04/04/2018 04/03/2018 04/03/2018  Glucose 65 - 99 mg/dL 257(H) 292(H) 137(H)  BUN 6 - 20 mg/dL 57(H) 54(H) 50(H)  Creatinine 0.61 - 1.24 mg/dL 1.67(H) 1.69(H) 1.53(H)  Sodium 135 - 145 mmol/L 137 136 138  Potassium 3.5 - 5.1 mmol/L 3.4(L) 4.2 4.1  Chloride 101 - 111 mmol/L 96(L) 96(L) 103  CO2 22 - 32 mmol/L _1 Calcium 8.9 - 10.3 mg/dL 8.7(L) 8.8(L) 8.6(L)   Chest Xray 03/31/2018 FINDINGS: The patient is status post CABG with a pacing device in place. Cardiomegaly and aortic atherosclerosis are seen. No acute bony abnormality. IMPRESSION: Cardiomegaly without acute disease. Atherosclerosis.  CHEST XRAY 04/02/2018 FINDINGS: Previous median sternotomy and CABG. Pacemaker leads in the region of the right atrium and right ventricle. Cardiomegaly. The pulmonary vascularity is normal. Slight increase in patchy density at both lung bases likely to represent mild basilar pneumonia. No advanced consolidation or collapse. No effusions. IMPRESSION: Suspicion of mild patchy bilateral lower lobe pneumonia. Cardiomegaly.  Previous CABG.  Pacemaker.  TRANSTHORACIC ECHOCARDIOGRAM Study Conclusions - Left ventricle: Septal and apical akinesis inferior wall   hypokinesis. The cavity size was moderately dilated. Wall   thickness was increased in a pattern of moderate LVH. The   estimated ejection fraction was 30%. Left ventricular diastolic   function parameters were normal. - Aortic valve: Valve area (Vmax): 2.2 cm^2. - Mitral valve: Calcified annulus. Mildly thickened leaflets .   There was  moderate regurgitation. - Left atrium: The atrium was moderately dilated. - Atrial septum: No defect or patent foramen ovale was identified.  VAS [place] LOWER EXTREMITY VENOUS DOPPLERS Final Interpretation: Right: There is no evidence of deep vein thrombosis in the lower extremity. No cystic structure found in the popliteal  fossa. Pitting edema noted in the calf. Left: There is no evidence of deep vein thrombosis in the lower extremity. No cystic structure found in the popliteal fossa. Pitting edema noted in the calf.  Discharge Instructions:  Discharge Instructions    (HEART FAILURE PATIENTS) Call MD:  Anytime you have any of the following symptoms: 1) 3 pound weight gain in 24 hours or 5 pounds in 1 week 2) shortness of breath, with or without a dry hacking cough 3) swelling in the hands, feet or stomach 4) if you have to sleep on extra pillows at night in order to breathe.   Complete by:  As directed    Call MD for:  difficulty breathing, headache or visual disturbances   Complete by:  As directed    Call MD for:  extreme fatigue   Complete by:  As directed    Call MD for:  temperature >100.4   Complete by:  As directed    Diet - low sodium heart healthy   Complete by:  As directed    Discharge instructions   Complete by:  As directed    Mr.  Anselm, [place] were admitted for a heart failure exacerbation, as well as pneumonia.   For your pneumonia, I have prescribed you two antibiotics: Augmentin and Azithromycin. Take Augmentin 875 mg twice daily (every 12 hours) starting 04/05/2018. I have also prescribed you Azithromycin 250 mg daily. Completing these antibiotics will give you a total antibiotic treatment duration of 5 days, as you received several days of IV antibiotics. I have also prescribed you Tessalon 100 mg daily, which can be taken up to 3 times daily for cough. I have only given you 6 tablets, as I think your cough will continue to improve with antibiotics.   For your heart  failure and lower extremity swelling, begin taking 40 mg of lasix daily. Resume all other medications as prescribed. Follow up with your cardiologist or primary care doctor within 1 week of hospitalization to ensure your symptoms improving and to check basic lab work.   Increase activity slowly   Complete by:  As directed      Signed: Melanee Spry, MD 04/04/2018, 1:53 PM   Pager: [phone]

## 2018-04-03 NOTE — Progress Notes (Signed)
Pt. Requesting med to help bowels move. On call for IMTS paged to make aware.

## 2018-04-03 NOTE — Progress Notes (Signed)
Inpatient Diabetes Program Recommendations  AACE/ADA: New Consensus Statement on Inpatient Glycemic Control (2015)  Target Ranges:  Prepandial:   less than 140 mg/dL      Peak postprandial:   less than 180 mg/dL (1-2 hours)      Critically ill patients:  140 - 180 mg/dL   Lab Results  Component Value Date   GLUCAP 144 (H) 04/03/2018   HGBA1C 6.9 (H) 12/06/2017    Review of Glycemic ControlResults for Charles [name], Charles [name]" (MRN [id number]) as of 04/03/2018 10:08  Ref. Range 04/02/2018 07:52 04/02/2018 12:49 04/02/2018 16:23 04/02/2018 22:13 04/03/2018 08:04  Glucose-Capillary Latest Ref Range: 65 - 99 mg/dL 157 (H) 226 (H) 330 (H) 170 (H) 144 (H)    Diabetes history: Type 2 DM  Outpatient Diabetes medications:  Novolog 15 units breakfast, 20 units lunch, and 20-25 units with supper, Tresiba 40 units daily, Farxiga 5 mg daily, Metformin 1500 mg daily with supper Current orders for Inpatient glycemic control:  Novolog moderate tid with meals, Lantus 30 units daily  Inpatient Diabetes Program Recommendations:    Please consider adding Novolog meal coverage 6 units tid with meals (hold if patient eats less than 50%). Also please recheck A1C.   Thanks,  Adah Perl, RN, BC-ADM Inpatient Diabetes Coordinator Pager [phone] (8a-5p)

## 2018-04-03 NOTE — Discharge Instructions (Signed)

## 2018-04-03 NOTE — Progress Notes (Addendum)
    Called by RN re: 5 bts NSVT, asymptomatic  K+ 4.1 this am, Mg not checked this admit. 1000 cc out this shift.   Recheck BMET and Mg now, keep K+ at 4.0, follow labs.   Rosaria Ferries, PA-C 04/03/2018 4:28 PM Beeper [phone]

## 2018-04-03 NOTE — Progress Notes (Addendum)
Progress Note  Patient Name: Charles [name] Date of Encounter: 04/03/2018  Primary Cardiologist: Shelva Majestic, MD   Subjective   Feeling a little bit better, cough continues.  Codeine caused some confusion this morning,  No chest pain  Inpatient Medications    Scheduled Meds: . alfuzosin  10 mg Oral Q breakfast  . aspirin EC  81 mg Oral Daily  . azithromycin  250 mg Oral Daily  . benzonatate  100 mg Oral TID  . feeding supplement (ENSURE ENLIVE)  237 mL Oral BID BM  . gabapentin  300 mg Oral TID  . guaiFENesin  600 mg Oral BID  . insulin aspart  0-15 Units Subcutaneous TID WC  . insulin glargine  30 Units Subcutaneous Daily  . ipratropium-albuterol  3 mL Nebulization TID  . losartan  25 mg Oral Daily  . metolazone  2.5 mg Oral Daily  . pantoprazole  40 mg Oral Daily  . rosuvastatin  7.5 mg Oral q1800  . sotalol  120 mg Oral BID  . warfarin  6 mg Oral ONCE-1800  . Warfarin - Pharmacist Dosing Inpatient   Does not apply q1800   Continuous Infusions: . cefTRIAXone (ROCEPHIN)  IV Stopped (04/02/18 1819)   PRN Meds: acetaminophen **OR** acetaminophen, albuterol, guaiFENesin-dextromethorphan, phenol   Vital Signs    Vitals:   04/02/18 2005 04/02/18 2053 04/02/18 2357 04/03/18 0554  BP: 104/60  117/67 122/67  Pulse: 70  70 69  Resp: 20   20  Temp: 97.7 F (36.5 C)   (!) 97.5 F (36.4 C)  TempSrc: Oral   Oral  SpO2: 97% 97%  99%  Weight:    272 lb 11.2 oz (123.7 kg)  Height:        Intake/Output Summary (Last 24 hours) at 04/03/2018 0858 Last data filed at 04/03/2018 0700 Gross per 24 hour  Intake 360 ml  Output 1810 ml  Net -1450 ml   Filed Weights   04/01/18 0424 04/02/18 0511 04/03/18 0554  Weight: 271 lb 1.6 oz (123 kg) 271 lb 8 oz (123.2 kg) 272 lb 11.2 oz (123.7 kg)    Telemetry    Atrial paced no other adverse arrhythmias, intraventricular conduction delay noted- Personally Reviewed  ECG    Paced, left bundle branch block- Personally  Reviewed  Physical Exam   GEN: No acute distress.  Coughing  neck: No JVD Cardiac: RRR, no murmurs, rubs, or gallops.  Respiratory:  Wheezes bilaterally. GI: Soft, nontender, non-distended  MS:  4+ lower extremity, wrapped edema; No deformity. Neuro:  Nonfocal  Psych: Normal affect   Labs    Chemistry Recent Labs  Lab 04/01/18 0445 04/02/18 0630 04/03/18 0547  NA 137 137 138  K 3.9 3.6 4.1  CL 103 103 103  CO2 20* 23 25  GLUCOSE 172* 168* 137*  BUN 40* 44* 50*  CREATININE 1.56* 1.43* 1.53*  CALCIUM 8.5* 8.6* 8.6*  PROT 7.5  --   --   ALBUMIN 2.4*  --   --   AST 32  --   --   ALT 53  --   --   ALKPHOS 202*  --   --   BILITOT 0.7  --   --   GFRNONAA 43* 48* 44*  GFRAA 50* 55* 51*  ANIONGAP 14 11 10      Hematology Recent Labs  Lab 03/31/18 1110 04/03/18 0547  WBC 9.9 10.2  RBC 4.00* 4.13*  HGB 11.1* 11.4*  HCT 35.6* 35.9*  MCV 89.0 86.9  MCH 27.8 27.6  MCHC 31.2 31.8  RDW 15.3 15.3  PLT 419* 399    Cardiac Enzymes Recent Labs  Lab 04/01/18 0946  TROPONINI <0.03    Recent Labs  Lab 03/31/18 1035  TROPIPOC 0.02     BNP Recent Labs  Lab 03/31/18 1110  BNP 203.4*     DDimer No results for input(s): DDIMER in the last 168 hours.   Radiology    Dg Chest 2 View  Result Date: 04/02/2018 CLINICAL DATA:  Cough, 3 days duration. EXAM: CHEST - 2 VIEW COMPARISON:  03/31/2018 FINDINGS: Previous median sternotomy and CABG. Pacemaker leads in the region of the right atrium and right ventricle. Cardiomegaly. The pulmonary vascularity is normal. Slight increase in patchy density at both lung bases likely to represent mild basilar pneumonia. No advanced consolidation or collapse. No effusions. IMPRESSION: Suspicion of mild patchy bilateral lower lobe pneumonia. Cardiomegaly.  Previous CABG.  Pacemaker. Electronically Signed   By: Nelson Chimes M.D.   On: 04/02/2018 13:19    Cardiac Studies   EF 30%, moderate LVH  Patient Profile     72 y.o. male here  with acute on chronic combined systolic diastolic heart failure, EF 30%, coronary artery disease status post CABG 2017 LIMA to LAD, SVG to diagonal, SVG to OM1, SVG to PDA, paroxysmal atrial fibrillation with permanent pacemaker placement on Coumadin, diabetes with hypertension hyperlipidemia.  Assessment & Plan    Acute on chronic combined systolic diastolic heart failure - Weight on 12/28/2017 was 294 pounds, on admission here he was 273 pounds, currently 272 pounds. - Diuresing with Lasix IV, improved yesterday but still could do better.  Creatinine relatively stable approximately 1.5.  Troponin normal.  I will increase his Lasix to 80 IV twice a day from 40 and I will also continue with low-dose metolazone as given yesterday 2.5 mg.  Continue to monitor his serum sodium, potassium closely.  I think we still have more fluid to go.  Coronary artery disease -Status post CABG.  No anginal symptoms.  Ischemic cardiomyopathy - Note, left bundle branch block, consider BIV upgrade at some point.  Reassess ejection fraction as outpatient.  Diabetes with hypertension hyperlipidemia - Continue with Crestor, losartan renal protection  Paroxysmal atrial fibrillation -Sotalol 120 mg twice a day, QTC 511 with left bundle branch block, reasonable -Has pacemaker for back-up.  Morbid obesity -Continue to encourage weight loss, decreasing carbohydrates   For questions or updates, please contact [place] Please consult [email or web address] for contact info under Cardiology/STEMI.      Signed, Candee Furbish, MD  04/03/2018, 8:58 AM

## 2018-04-03 NOTE — Progress Notes (Addendum)
ANTICOAGULATION CONSULT NOTE  Pharmacy Consult:  Coumadin Indication: atrial fibrillation  Allergies  Allergen Reactions  . Chocolate Hives, Shortness Of Breath and Swelling  . Statins Other (See Comments)    Mental changes, muscle aches  . Black Pepper [[place]]     Irritates back of throat  . Codeine Itching  . Oxytetracycline Other (See Comments)    Flushing in sunlight  . Tape Rash and Other (See Comments)    SKIN IS VERY SENSITIVE!!    Patient Measurements: Height: 5\' 9"  (175.3 cm) Weight: 272 lb 11.2 oz (123.7 kg)(scale b) IBW/kg (Calculated) : 70.7  Vital Signs: Temp: 97.5 F (36.4 C) (05/13 0554) Temp Source: Oral (05/13 0554) BP: 122/67 (05/13 0554) Pulse Rate: 69 (05/13 0554)  Labs: Recent Labs    03/31/18 1110  04/01/18 0445 04/01/18 0946 04/02/18 0630 04/03/18 0547  HGB 11.1*  --   --   --   --  11.4*  HCT 35.6*  --   --   --   --  35.9*  PLT 419*  --   --   --   --  399  LABPROT  --    < > 27.2*  --  26.9* 25.9*  INR  --    < > 2.54  --  2.52 2.39  CREATININE 1.50*  --  1.56*  --  1.43* 1.53*  TROPONINI  --   --   --  <0.03  --   --    < > = values in this interval not displayed.    Estimated Creatinine Clearance: 57.6 mL/min (A) (by C-G formula based on SCr of 1.53 mg/dL (H)).   Assessment: 58 YOM with history of AFib to continue on Coumadin from PTA (CHADsVASc = 4).  Dopplers negative for DVT.  INR is therapeutic and trended down slightly.  No bleeding reported.  Home Coumadin dose: 5mg  PO daily except 73.5mg  on Mon   Goal of Therapy:  INR 2-3 Monitor platelets by anticoagulation protocol: Yes    Plan:  Coumadin 6mg  PO today Daily PT / INR   Parlee Amescua D. Mina Marble, PharmD, BCPS, BCCCP Pager:  [phone] 04/03/2018, 8:41 AM

## 2018-04-04 DIAGNOSIS — L8990 Pressure ulcer of unspecified site, unspecified stage: Secondary | ICD-10-CM

## 2018-04-04 HISTORY — DX: Pressure ulcer of unspecified site, unspecified stage: L89.90

## 2018-04-04 LAB — GLUCOSE, CAPILLARY
Glucose-Capillary: 223 mg/dL — ABNORMAL HIGH (ref 65–99)
Glucose-Capillary: 259 mg/dL — ABNORMAL HIGH (ref 65–99)
Glucose-Capillary: 232 mg/dL — ABNORMAL HIGH (ref 65–99)

## 2018-04-04 LAB — BASIC METABOLIC PANEL
ANION GAP: 15 (ref 5–15)
BUN: 57 mg/dL — ABNORMAL HIGH (ref 6–20)
CO2: 26 mmol/L (ref 22–32)
Calcium: 8.7 mg/dL — ABNORMAL LOW (ref 8.9–10.3)
Chloride: 96 mmol/L — ABNORMAL LOW (ref 101–111)
Creatinine, Ser: 1.67 mg/dL — ABNORMAL HIGH (ref 0.61–1.24)
GFR calc Af Amer: 46 mL/min — ABNORMAL LOW (ref >60)
GFR, EST NON AFRICAN AMERICAN: 40 mL/min — AB (ref >60)
GLUCOSE: 257 mg/dL — AB (ref 65–99)
Potassium: 3.4 mmol/L — ABNORMAL LOW (ref 3.5–5.1)
SODIUM: 137 mmol/L (ref 135–145)

## 2018-04-04 LAB — PROTIME-INR
INR: 2.4
PROTHROMBIN TIME: 25.9 s — AB (ref 11.4–15.2)

## 2018-04-04 MED ORDER — ZINC OXIDE 40 % EX OINT
TOPICAL_OINTMENT | Freq: Three times a day (TID) | CUTANEOUS | Status: DC
  Administered 2018-04-04: 1 via TOPICAL
  Administered 2018-04-04: 13:00:00 via TOPICAL
  Filled 2018-04-04: qty 113

## 2018-04-04 MED ORDER — ZINC OXIDE 40 % EX OINT: TOPICAL_OINTMENT | Freq: Three times a day (TID) | CUTANEOUS | 0 refills | Status: DC

## 2018-04-04 MED ORDER — POTASSIUM CHLORIDE CRYS ER 20 MEQ PO TBCR: 40.0000 meq | EXTENDED_RELEASE_TABLET | Freq: Once | ORAL | Status: DC

## 2018-04-04 MED ORDER — METOLAZONE 2.5 MG PO TABS: 2.5000 mg | ORAL_TABLET | Freq: Every day | ORAL | 0 refills | Status: DC

## 2018-04-04 MED ORDER — AZITHROMYCIN 250 MG PO TABS: 250.0000 mg | ORAL_TABLET | Freq: Every day | ORAL | 0 refills | Status: AC

## 2018-04-04 MED ORDER — INSULIN ASPART 100 UNIT/ML ~~LOC~~ SOLN
6.0000 [IU] | Freq: Three times a day (TID) | SUBCUTANEOUS | Status: DC
  Administered 2018-04-04 (×2): 6 [IU] via SUBCUTANEOUS

## 2018-04-04 MED ORDER — POTASSIUM CHLORIDE CRYS ER 20 MEQ PO TBCR
40.0000 meq | EXTENDED_RELEASE_TABLET | Freq: Two times a day (BID) | ORAL | Status: AC
  Administered 2018-04-04 (×2): 40 meq via ORAL
  Filled 2018-04-04 (×2): qty 2

## 2018-04-04 MED ORDER — AMOXICILLIN-POT CLAVULANATE 875-125 MG PO TABS: 1.0000 | ORAL_TABLET | Freq: Two times a day (BID) | ORAL | 0 refills | Status: AC

## 2018-04-04 MED ORDER — PROMETHAZINE HCL 25 MG PO TABS: 25.0000 mg | ORAL_TABLET | Freq: Four times a day (QID) | ORAL | 0 refills | Status: DC | PRN

## 2018-04-04 MED ORDER — BENZONATATE 100 MG PO CAPS: 100.0000 mg | ORAL_CAPSULE | Freq: Three times a day (TID) | ORAL | 0 refills | Status: DC | PRN

## 2018-04-04 MED ORDER — AMOXICILLIN-POT CLAVULANATE 875-125 MG PO TABS
1.0000 | ORAL_TABLET | Freq: Two times a day (BID) | ORAL | Status: DC
  Administered 2018-04-04: 1 via ORAL
  Filled 2018-04-04: qty 1

## 2018-04-04 MED ORDER — FUROSEMIDE 40 MG PO TABS: 40.0000 mg | ORAL_TABLET | Freq: Every day | ORAL | 0 refills | Status: DC

## 2018-04-04 MED ORDER — INSULIN GLARGINE 100 UNIT/ML ~~LOC~~ SOLN
35.0000 [IU] | Freq: Every day | SUBCUTANEOUS | Status: DC
  Administered 2018-04-04: 35 [IU] via SUBCUTANEOUS
  Filled 2018-04-04: qty 0.35

## 2018-04-04 NOTE — Consult Note (Signed)
[facility] [facility] Nurse wound consult note Reason for Consult: Skin impairment to right gluteal fold Wound type: MASD, friction.  Patient stated it started weeks ago when he was hospitalized and then he sat in a recliner for days after he was discharged home. Measurement: 0.8 cm x 1.4 cm x no appreciable depth Wound bed:100% pink  Drainage (amount, consistency, odor) no odor or drainage Periwound: erythematous Dressing procedure/placement/frequency: Desitin three times daily Monitor the wound area(s) for worsening of condition such as: Signs/symptoms of infection,  Increase in size,  Development of or worsening of odor, Development of pain, or increased pain at the affected locations.  Notify the medical team if any of these develop.  Thank you for the consult.  Discussed plan of care with the patient and bedside nurse.  [facility] nurse will not follow at this time.  Please re-consult the [facility] team if needed.  Val Riles, RN, MSN, CWOCN, CNS-BC, pager [phone]

## 2018-04-04 NOTE — Progress Notes (Signed)
Progress Note  Patient Name: Charles [name] Date of Encounter: 04/04/2018  Primary Cardiologist: Shelva Majestic, MD   Subjective   Cough may have improved. Less SOB, no CP. Still edematous.   Inpatient Medications    Scheduled Meds: . alfuzosin  10 mg Oral Q breakfast  . amoxicillin-clavulanate  1 tablet Oral Q12H  . aspirin EC  81 mg Oral Daily  . azithromycin  250 mg Oral Daily  . benzonatate  100 mg Oral TID  . feeding supplement (ENSURE ENLIVE)  237 mL Oral BID BM  . gabapentin  300 mg Oral TID  . guaiFENesin  600 mg Oral BID  . insulin aspart  0-15 Units Subcutaneous TID WC  . insulin aspart  6 Units Subcutaneous TID WC  . insulin glargine  35 Units Subcutaneous Daily  . ipratropium-albuterol  3 mL Nebulization TID  . liver oil-zinc oxide   Topical TID BM  . losartan  25 mg Oral Daily  . metolazone  2.5 mg Oral Daily  . pantoprazole  40 mg Oral Daily  . polyethylene glycol  17 g Oral Daily  . potassium chloride  40 mEq Oral BID WC  . rosuvastatin  7.5 mg Oral q1800  . senna  1 tablet Oral Daily  . sotalol  120 mg Oral BID  . Warfarin - Pharmacist Dosing Inpatient   Does not apply q1800   Continuous Infusions:  PRN Meds: acetaminophen **OR** acetaminophen, albuterol, guaiFENesin-dextromethorphan, phenol   Vital Signs    Vitals:   04/03/18 1948 04/03/18 2040 04/04/18 0615 04/04/18 0744  BP: 104/63  111/65   Pulse: 77 78 83   Resp: 16 18 18    Temp: (!) 97.5 F (36.4 C)  (!) 97.5 F (36.4 C)   TempSrc: Oral  Oral   SpO2: 97% 97% 98% 95%  Weight:   269 lb 1.6 oz (122.1 kg)   Height:        Intake/Output Summary (Last 24 hours) at 04/04/2018 1050 Last data filed at 04/04/2018 1017 Gross per 24 hour  Intake 1160 ml  Output 2550 ml  Net -1390 ml   Filed Weights   04/02/18 0511 04/03/18 0554 04/04/18 0615  Weight: 271 lb 8 oz (123.2 kg) 272 lb 11.2 oz (123.7 kg) 269 lb 1.6 oz (122.1 kg)    Telemetry    Atrial paced no other adverse arrhythmias,  intraventricular conduction delay noted, stable- Personally Reviewed  ECG    Paced, left bundle branch block- Personally Reviewed  Physical Exam   GEN: Well nourished, well developed, in no acute distress  HEENT: normal  Neck: no JVD, carotid bruits, or masses Cardiac: RRR; no murmurs, rubs, or gallops, 3+ wrapped LE edema  Respiratory:  clear to auscultation bilaterally, normal work of breathing GI: soft, nontender, nondistended, + BS MS: no deformity or atrophy  Skin: warm and dry, no rash Neuro:  Alert and Oriented x 3, Strength and sensation are intact Psych: euthymic mood, full affect   Labs    Chemistry Recent Labs  Lab 04/01/18 0445  04/03/18 0547 04/03/18 1635 04/04/18 0446  NA 137   < > 138 136 137  K 3.9   < > 4.1 4.2 3.4*  CL 103   < > 103 96* 96*  CO2 20*   < > 25 28 26   GLUCOSE 172*   < > 137* 292* 257*  BUN 40*   < > 50* 54* 57*  CREATININE 1.56*   < > 1.53* 1.69*  1.67*  CALCIUM 8.5*   < > 8.6* 8.8* 8.7*  PROT 7.5  --   --   --   --   ALBUMIN 2.4*  --   --   --   --   AST 32  --   --   --   --   ALT 53  --   --   --   --   ALKPHOS 202*  --   --   --   --   BILITOT 0.7  --   --   --   --   GFRNONAA 43*   < > 44* 39* 40*  GFRAA 50*   < > 51* 45* 46*  ANIONGAP 14   < > 10 12 15    < > = values in this interval not displayed.     Hematology Recent Labs  Lab 03/31/18 1110 04/03/18 0547  WBC 9.9 10.2  RBC 4.00* 4.13*  HGB 11.1* 11.4*  HCT 35.6* 35.9*  MCV 89.0 86.9  MCH 27.8 27.6  MCHC 31.2 31.8  RDW 15.3 15.3  PLT 419* 399    Cardiac Enzymes Recent Labs  Lab 04/01/18 0946  TROPONINI <0.03    Recent Labs  Lab 03/31/18 1035  TROPIPOC 0.02     BNP Recent Labs  Lab 03/31/18 1110  BNP 203.4*     DDimer No results for input(s): DDIMER in the last 168 hours.   Radiology    Dg Chest 2 View  Result Date: 04/02/2018 CLINICAL DATA:  Cough, 3 days duration. EXAM: CHEST - 2 VIEW COMPARISON:  03/31/2018 FINDINGS: Previous median  sternotomy and CABG. Pacemaker leads in the region of the right atrium and right ventricle. Cardiomegaly. The pulmonary vascularity is normal. Slight increase in patchy density at both lung bases likely to represent mild basilar pneumonia. No advanced consolidation or collapse. No effusions. IMPRESSION: Suspicion of mild patchy bilateral lower lobe pneumonia. Cardiomegaly.  Previous CABG.  Pacemaker. Electronically Signed   By: Nelson Chimes M.D.   On: 04/02/2018 13:19    Cardiac Studies   EF 30%, moderate LVH  Patient Profile     72 y.o. 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  Assessment & Plan    Acute on chronic combined systolic diastolic heart failure - Weight on 12/28/2017 was 294 pounds, on admission here he was 273 pounds, currently 272 pounds. - Diuresing with Lasix IV, improved again yesterday.  Creatinine relatively stable approximately 1.5-1.6.  Troponin normal.  IContinue Lasix to 80 IV twice a day also continue with low-dose metolazone as given yesterday 2.5 mg.  BUN is creeping up. Continue to monitor his serum sodium, potassium closely.  I think we still have more fluid to go again today.  Coronary artery disease -Status post CABG.  No anginal symptoms. Stable  Ischemic cardiomyopathy - Note, left bundle branch block, consider BIV upgrade at some point.  Reassess ejection fraction as outpatient. No changes. Consider Entresto if able to tolerate from BP.   Diabetes with hypertension hyperlipidemia - Continue with Crestor, losartan renal protection  Paroxysmal atrial fibrillation -Sotalol 120 mg twice a day, QTC 511 with left bundle branch block, reasonable -Has pacemaker for back-up. Stable.   Morbid  obesity -Continue to encourage weight loss, decreasing carbohydrates.  Discussed   For questions or updates, please contact [place] Please consult [email or web address] for contact info under Cardiology/STEMI.      Signed, Candee Furbish, MD  04/04/2018, 10:50 AM

## 2018-04-04 NOTE — Progress Notes (Signed)
Inpatient Diabetes Program Recommendations  AACE/ADA: New Consensus Statement on Inpatient Glycemic Control (2015)  Target Ranges:  Prepandial:   less than 140 mg/dL      Peak postprandial:   less than 180 mg/dL (1-2 hours)      Critically ill patients:  140 - 180 mg/dL   Lab Results  Component Value Date   GLUCAP 223 (H) 04/04/2018   HGBA1C 6.9 (H) 12/06/2017    Review of Glycemic ControlResults for Charles [name], Charles [name]" (MRN [id number]) as of 04/04/2018 09:59  Ref. Range 04/03/2018 11:21 04/03/2018 16:08 04/03/2018 21:22 04/04/2018 08:18  Glucose-Capillary Latest Ref Range: 65 - 99 mg/dL 244 (H) 290 (H) 204 (H) 223 (H)   Diabetes history: Type 2 DM  Outpatient Diabetes medications: Farxiga 5 mg daily, Novolog 15 units q AM, 20 units with lunch, and 20-25 units with supper, Tresiba 40 units daily Current orders for Inpatient glycemic control:  Lantus 35 units daily, Novolog moderate tid with meals  Inpatient Diabetes Program Recommendations:   Please add Novolog meal coverage 6 units tid with meals (hold if patient eats less than 50%).   Thanks,  Adah Perl, RN, BC-ADM Inpatient Diabetes Coordinator Pager [phone] (8a-5p)

## 2018-04-04 NOTE — Plan of Care (Signed)

## 2018-04-04 NOTE — Progress Notes (Signed)
Occupational Therapy Treatment Patient Details Name: Charles [name] MRN: [id number] DOB: [birth year] Today's Date: 04/04/2018    History of present illness 72 yo male with complex cardiac history including combined diastolic and systolic HF (61-44%), PAF on warfarin, CAD s/p CABG (2017), pacemaker 2/2 sick sinus syndrome, OSA unable to tolerate CPAP recently, HTN, type 2 DM, and chronic venous insufficiency presenting with worsening lower extremity swelling, dyspnea on exertion, and cough.   OT comments  Pt progressing towards established OT goals. Pt donning shirt and pants with Min A. Educating pt on compensatory techniques for UB and LB dressing to increase independence and safety and decrease pain at shoulder. Pt demonstrating understanding. Continue to recommend dc home with follow up at OP OT to optimize independence with ADLs. Will continue to follow acutely as admitted.    Follow Up Recommendations  Outpatient OT;Supervision - Intermittent    Equipment Recommendations  None recommended by OT    Recommendations for Other Services PT consult    Precautions / Restrictions Precautions Precautions: Fall Restrictions Weight Bearing Restrictions: No       Mobility Bed Mobility               General bed mobility comments: Pt seated in chair upon arrival  Transfers Overall transfer level: Needs assistance Equipment used: None Transfers: Sit to/from Stand Sit to Stand: Supervision         General transfer comment: supervision for safety    Balance Overall balance assessment: Needs assistance Sitting-balance support: No upper extremity supported Sitting balance-Leahy Scale: Good     Standing balance support: No upper extremity supported;During functional activity Standing balance-Leahy Scale: Fair                             ADL either performed or assessed with clinical judgement   ADL Overall ADL's : Needs assistance/impaired                  Upper Body Dressing : Minimal assistance;Sitting Upper Body Dressing Details (indicate cue type and reason): Pt donning shirt with Min A to adjust sleeve over RUE. Educating pt on compensatory techniques for donning/doffing shirts to optimize independence and decreased pain. Pt demonstrating and verablizing understanding. Lower Body Dressing: Sit to/from stand;Minimal assistance Lower Body Dressing Details (indicate cue type and reason): Educating pt on compensatory techniques to optimize independence with donning/doffing pants. Pt demonstrating understanding. Pt requiring Min A to pull pants over right foot.     Toileting- Water quality scientist and Hygiene: Min guard;Sit to/from stand Toileting - Clothing Manipulation Details (indicate cue type and reason): Min Guard for safety during peri care before donning new clothes     Functional mobility during ADLs: Min guard General ADL Comments: Providing pt with education on compensatory techniques for dressing. Discussing options for OP OT to increase independence.     Vision       Perception     Praxis      Cognition Arousal/Alertness: Awake/alert Behavior During Therapy: WFL for tasks assessed/performed Overall Cognitive Status: Within Functional Limits for tasks assessed                                          Exercises     Shoulder Instructions       General Comments Pt with consistance cough throughout session  Pertinent Vitals/ Pain       Pain Assessment: Faces Faces Pain Scale: Hurts little more Pain Location: R knee; R shoulder increased pain with movement Pain Descriptors / Indicators: Constant;Discomfort Pain Intervention(s): Monitored during session;Repositioned  Home Living                                          Prior Functioning/Environment              Frequency  Min 2X/week        Progress Toward Goals  OT Goals(current goals can now be found in  the care plan section)  Progress towards OT goals: Progressing toward goals  Acute Rehab OT Goals Patient Stated Goal: Go home and start rehab on shoulder OT Goal Formulation: With patient Time For Goal Achievement: 04/15/18 Potential to Achieve Goals: Good ADL Goals Pt Will Perform Upper Body Bathing: with set-up;with supervision;sitting;with adaptive equipment Pt Will Perform Upper Body Dressing: with set-up;with supervision;sitting(using compensatory techniques) Pt Will Perform Lower Body Dressing: with set-up;with supervision;with adaptive equipment;sit to/from stand Pt Will Transfer to Toilet: with set-up;with supervision;regular height toilet;ambulating  Plan Discharge plan remains appropriate    Co-evaluation                 AM-PAC PT "6 Clicks" Daily Activity     Outcome Measure   Help from another person eating meals?: None Help from another person taking care of personal grooming?: A Little Help from another person toileting, which includes using toliet, bedpan, or urinal?: A Little Help from another person bathing (including washing, rinsing, drying)?: A Little Help from another person to put on and taking off regular upper body clothing?: A Little Help from another person to put on and taking off regular lower body clothing?: A Little 6 Click Score: 19    End of Session    OT Visit Diagnosis: Unsteadiness on feet (R26.81);Other abnormalities of gait and mobility (R26.89);Muscle weakness (generalized) (M62.81);Pain;History of falling (Z91.81) Pain - Right/Left: Right Pain - part of body: Shoulder   Activity Tolerance Patient tolerated treatment well   Patient Left in chair;with call bell/phone within reach   Nurse Communication Mobility status        Time: 6812-7517 OT Time Calculation (min): 20 min  Charges: OT General Charges $OT Visit: 1 Visit OT Treatments $Self Care/Home Management : 8-22 mins  [place], OTR/L Acute  Rehab Pager: [phone] Office: [place] 04/04/2018, 2:56 PM

## 2018-04-04 NOTE — Progress Notes (Signed)
   Subjective:  Patient seen and examined. States he did well overnight. Cough has improved. Denies SOB or CP. Ambulating well without DOE.   Objective:  Vital signs in last 24 hours: Vitals:   04/03/18 1948 04/03/18 2040 04/04/18 0615 04/04/18 0744  BP: 104/63  111/65   Pulse: 77 78 83   Resp: 16 18 18    Temp: (!) 97.5 F (36.4 C)  (!) 97.5 F (36.4 C)   TempSrc: Oral  Oral   SpO2: 97% 97% 98% 95%  Weight:   269 lb 1.6 oz (122.1 kg)   Height:       General: Sitting in chair comfortably, NAD HEENT: [name]/AT, no JVD Cardiac: RRR, No R/M/G appreciated Pulm: normal effort, good aeration, scattered expiratory hum, significant improvement from prior exam Abd: soft, non tender, non distended, BS normal Ext: wrapped in ace bandages  Neuro: alert and oriented X3, cranial nerves II-XII grossly intact   Assessment/Plan:  Acute on Chronic Combined Systolic and Diastolic CHF Lower Extremity Edema Echo demonstratesworsening of heart failure with ejection fraction of 30% with septal and inferior akinesia-declined as compared to the previous echo done in 2017 EF 45 to 50%. Remains net negative >3.5 liters, negative 1.5 liters within the last 24 hours. Weight 269 today. Creatinine elevated from baseline at 1.67, BUN 57 following diuresis. Appears euvolemic on exam, lung exam significantly improved.  -Patient is stable for discharge from primary team perspective -Patient will need repeat ECHO as outpatient per Cardiology  -Metolazone 2.5 mg daily  -Losartan 25 mg daily   Community Acquired Pneumonia  Repeat CXR revealed bibasilar patchy densities consistent with pneumonia. Received 3 days of azithromycin and 2 days of ceftriaxone. Cough is significantly improved. Lung exam also improved. Will discharge on antibiotics for a total treatment duration of 5 days.  -Start Augmetin 800 mg BID for additional 3 days  -Continue Azithromycin for additional 2 days   AKI Patient's baseline creatinine  ranges from 1.2-1.35. Creatinine elevated at 1.67, BUN 57 following diuresis. Suspect pre-renal in setting of diuresis.  -Recommend BMP at hospital follow up to ensure creatinine is trending toward baseline   PAF Currently in NSR, atrial paced and rate controlled. Had 5 beats VT yesterday. No recurrence since, patient was asymptomatic.  -Warfarin per pharmacy -PT/INR - therapeutic  -Continue Sotalol 120 mg BID   Type 2 DM  Most recent Hemoglobin A1C 6.9. BG elevated >200s yesterday.  -CBG monitoring TID AC and nightly  -Increase Lantus to 35 units daily  -SSI moderate coverage  -Will add Novolog 6 units TID with meals  -Will resume home medications at discharge.   CAD s/p CABG No active CP or anginal symptoms. Cardiology does not feel patient requires ischemic evaluation at this time.  -Continue ASA 81 mg  -Continue Crestor   HTN Normotensive.  -Continue Losartan 25 mg daily   OSA -Has declined CPAP while hospitalized.   GERD -Continue Protonix 40 mg     Dispo: Anticipated discharge today.   Charles Spry, MD 04/04/2018, 9:07 AM Pager: [phone]

## 2018-04-04 NOTE — Progress Notes (Signed)
Physical Therapy Treatment Patient Details Name: Charles [name] MRN: [id number] DOB: [birth year] Today's Date: 04/04/2018    History of Present Illness 72 yo male with complex cardiac history including combined diastolic and systolic HF (56-25%), PAF on warfarin, CAD s/p CABG (2017), pacemaker 2/2 sick sinus syndrome, OSA unable to tolerate CPAP recently, HTN, type 2 DM, and chronic venous insufficiency presenting with worsening lower extremity swelling, dyspnea on exertion, and cough.    PT Comments    Pt pleasant and agreeable to participate with therapy, Session focused on ambulation and stairs due to pt report of having 13 stairs to get to master bathroom. Pt able to ascend 9 stairs sideways with R hand rail with BUE with increased fatigue. Pt ambulated 400 feet without AD but did become fatigued and exhibited 2 episodes of LOB requiring min A for correction. Pt educated regarding use of cane/walking stick which pt reports having for mobility inside and outside the home due to present instability and tendency to reach for objects when ambulating that may be unstable. Stair goal added today due to pt report.  Follow Up Recommendations  Supervision for mobility/OOB(Pt already has OPPT follow up)     Equipment Recommendations  None recommended by PT    Recommendations for Other Services       Precautions / Restrictions Precautions Precautions: Fall Restrictions Weight Bearing Restrictions: No    Mobility  Bed Mobility               General bed mobility comments: Pt seated in chair upon arrival  Transfers Overall transfer level: Modified independent Equipment used: None Transfers: Sit to/from Stand Sit to Stand: Modified independent (Device/Increase time)         General transfer comment: Pt requires increased time to perform sit<>stand and pushes up from chair with BUE  Ambulation/Gait Ambulation/Gait assistance: Min guard;Min assist Ambulation Distance (Feet):  400 Feet Assistive device: None Gait Pattern/deviations: Step-through pattern;Wide base of support;Trendelenburg;Decreased stride length Gait velocity: 1.2 ft/sec at end of 400 ft ambulation Gait velocity interpretation: <1.31 ft/sec, indicative of household ambulator General Gait Details: Pt ambulates in room and in hall without AD. In room pt reaches for walls and objects to assist balance. In hallway pt able to ambulate min guard without reaching for railing or walls. Pt exhibits trendelenburg gait pattern as his trunk and head lean over L hip during L stance phase. After 200 ft amb, pt reports fatigue and required standing rest break. SpO2% checked and found to be 94-96 on RA throughout activity. Gait speed diminished during last 200 feet and experienced 2 episodes of LOB requiring min A for correction. Pt educated regarding use of cane/walking stick in and outside house to increase stabiility and decrease fall risk   Stairs Stairs: Yes Stairs assistance: Min guard Stair Management: One rail Right;Sideways Number of Stairs: 9 General stair comments: Pt reports having 1 flight of stairs to reach 2nd floor where bathroom is in home. Pt had difficulty ascending first step forward. Pt able to ascend/descend 9 stairs sideways using R hand ral with BUE with increased fatigue.   Wheelchair Mobility    Modified Rankin (Stroke Patients Only)       Balance Overall balance assessment: Needs assistance Sitting-balance support: No upper extremity supported Sitting balance-Leahy Scale: Good       Standing balance-Leahy Scale: Fair  Cognition Arousal/Alertness: Awake/alert Behavior During Therapy: WFL for tasks assessed/performed Overall Cognitive Status: Within Functional Limits for tasks assessed                                        Exercises      General Comments        Pertinent Vitals/Pain Pain Assessment: No/denies  pain    Home Living                      Prior Function            PT Goals (current goals can now be found in the care plan section) Acute Rehab PT Goals Patient Stated Goal: Go home and start rehab on shoulder PT Goal Formulation: With patient Time For Goal Achievement: 04/15/18 Potential to Achieve Goals: Good Progress towards PT goals: Progressing toward goals    Frequency    Min 3X/week      PT Plan Current plan remains appropriate    Co-evaluation              AM-PAC PT "6 Clicks" Daily Activity  Outcome Measure  Difficulty turning over in bed (including adjusting bedclothes, sheets and blankets)?: None Difficulty moving from lying on back to sitting on the side of the bed? : None Difficulty sitting down on and standing up from a chair with arms (e.g., wheelchair, bedside commode, etc,.)?: A Little Help needed moving to and from a bed to chair (including a wheelchair)?: A Little Help needed walking in hospital room?: A Little Help needed climbing 3-5 steps with a railing? : A Little 6 Click Score: 20    End of Session Equipment Utilized During Treatment: Gait belt Activity Tolerance: Patient tolerated treatment well Patient left: in chair;with call bell/phone within reach   PT Visit Diagnosis: Unsteadiness on feet (R26.81)     Time: 3016-0109 PT Time Calculation (min) (ACUTE ONLY): 26 min  Charges:  $Gait Training: 23-37 mins                    G Codes:       Gabe Kendrik Mcshan, SPT   [employer] 04/04/2018, 1:59 PM

## 2018-04-04 NOTE — Progress Notes (Signed)
Pt discharged home with wife. AVS given to and reviewed in full with patient. All questions answered. All belongings sent with patient. VSS. BP (!) 113/58 (BP Location: Right Arm)   Pulse 70   Temp (!) 97.4 F (36.3 C) (Oral)   Resp 20   Ht 5\' 9"  (1.753 m)   Wt 122.1 kg (269 lb 1.6 oz) Comment: scale b  SpO2 94%   BMI 39.74 kg/m

## 2018-04-11 DIAGNOSIS — M25511 Pain in right shoulder: Secondary | ICD-10-CM | POA: Diagnosis not present

## 2018-04-11 DIAGNOSIS — M1711 Unilateral primary osteoarthritis, right knee: Secondary | ICD-10-CM | POA: Diagnosis not present

## 2018-04-14 ENCOUNTER — Ambulatory Visit (INDEPENDENT_AMBULATORY_CARE_PROVIDER_SITE_OTHER): Payer: BLUE CROSS/BLUE SHIELD | Admitting: Cardiovascular Disease

## 2018-04-14 ENCOUNTER — Ambulatory Visit: Payer: BLUE CROSS/BLUE SHIELD | Admitting: Cardiovascular Disease

## 2018-04-14 ENCOUNTER — Encounter: Payer: Self-pay | Admitting: Cardiovascular Disease

## 2018-04-14 VITALS — BP 88/50 | HR 76 | Ht 69.0 in | Wt 267.0 lb

## 2018-04-14 DIAGNOSIS — Z79899 Other long term (current) drug therapy: Secondary | ICD-10-CM

## 2018-04-14 DIAGNOSIS — Z7901 Long term (current) use of anticoagulants: Secondary | ICD-10-CM

## 2018-04-14 DIAGNOSIS — Z951 Presence of aortocoronary bypass graft: Secondary | ICD-10-CM

## 2018-04-14 DIAGNOSIS — I480 Paroxysmal atrial fibrillation: Secondary | ICD-10-CM | POA: Diagnosis not present

## 2018-04-14 DIAGNOSIS — G4733 Obstructive sleep apnea (adult) (pediatric): Secondary | ICD-10-CM

## 2018-04-14 DIAGNOSIS — I5042 Chronic combined systolic (congestive) and diastolic (congestive) heart failure: Secondary | ICD-10-CM

## 2018-04-14 DIAGNOSIS — R600 Localized edema: Secondary | ICD-10-CM | POA: Diagnosis not present

## 2018-04-14 DIAGNOSIS — Z9989 Dependence on other enabling machines and devices: Secondary | ICD-10-CM | POA: Diagnosis not present

## 2018-04-14 DIAGNOSIS — E1159 Type 2 diabetes mellitus with other circulatory complications: Secondary | ICD-10-CM | POA: Diagnosis not present

## 2018-04-14 DIAGNOSIS — Z794 Long term (current) use of insulin: Secondary | ICD-10-CM

## 2018-04-14 DIAGNOSIS — I259 Chronic ischemic heart disease, unspecified: Secondary | ICD-10-CM

## 2018-04-14 DIAGNOSIS — N183 Chronic kidney disease, stage 3 unspecified: Secondary | ICD-10-CM

## 2018-04-14 MED ORDER — FUROSEMIDE 40 MG PO TABS: 20.0000 mg | ORAL_TABLET | Freq: Every day | ORAL | 0 refills | Status: DC
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
## 2018-04-14 NOTE — Patient Instructions (Addendum)
Medication Instructions:  DECREASE furosemide (Lasix) 20 mg daily  Labwork: BMET, BNP today    Follow-Up: 2-3 weeks with NP or PA 6 weeks with Dr. Claiborne Billings  Any Other Special Instructions Will Be Listed Below (If Applicable).     If you need a refill on your cardiac medications before your next appointment, please call your pharmacy.

## 2018-04-15 LAB — BASIC METABOLIC PANEL
BUN / CREAT RATIO: 18 (ref 10–24)
BUN: 22 mg/dL (ref 8–27)
CHLORIDE: 102 mmol/L (ref 96–106)
CO2: 23 mmol/L (ref 20–29)
Calcium: 8.3 mg/dL — ABNORMAL LOW (ref 8.6–10.2)
Creatinine, Ser: 1.2 mg/dL (ref 0.76–1.27)
GFR calc Af Amer: 70 mL/min/{1.73_m2} (ref >59)
GFR calc non Af Amer: 60 mL/min/{1.73_m2} (ref >59)
GLUCOSE: 224 mg/dL — AB (ref 65–99)
POTASSIUM: 3.9 mmol/L (ref 3.5–5.2)
SODIUM: 140 mmol/L (ref 134–144)

## 2018-04-15 LAB — BRAIN NATRIURETIC PEPTIDE: BNP: 295.5 pg/mL — AB (ref 0.0–100.0)

## 2018-04-16 ENCOUNTER — Encounter: Payer: Self-pay | Admitting: Cardiovascular Disease

## 2018-04-18 DIAGNOSIS — M1711 Unilateral primary osteoarthritis, right knee: Secondary | ICD-10-CM | POA: Diagnosis not present

## 2018-04-19 DIAGNOSIS — M25511 Pain in right shoulder: Secondary | ICD-10-CM | POA: Diagnosis not present

## 2018-04-21 ENCOUNTER — Encounter

## 2018-04-21 DIAGNOSIS — M25511 Pain in right shoulder: Secondary | ICD-10-CM | POA: Diagnosis not present

## 2018-04-25 DIAGNOSIS — M25511 Pain in right shoulder: Secondary | ICD-10-CM | POA: Diagnosis not present

## 2018-04-26 DIAGNOSIS — M1711 Unilateral primary osteoarthritis, right knee: Secondary | ICD-10-CM | POA: Diagnosis not present

## 2018-04-27 ENCOUNTER — Other Ambulatory Visit: Payer: Self-pay

## 2018-04-27 MED ORDER — PANTOPRAZOLE SODIUM 40 MG PO TBEC: 40.0000 mg | DELAYED_RELEASE_TABLET | Freq: Every day | ORAL | 0 refills | Status: DC

## 2018-04-28 DIAGNOSIS — M25511 Pain in right shoulder: Secondary | ICD-10-CM | POA: Diagnosis not present

## 2018-05-03 ENCOUNTER — Ambulatory Visit: Payer: BLUE CROSS/BLUE SHIELD | Admitting: Physician Assistant

## 2018-05-05 DIAGNOSIS — M25511 Pain in right shoulder: Secondary | ICD-10-CM | POA: Diagnosis not present

## 2018-05-09 DIAGNOSIS — M25511 Pain in right shoulder: Secondary | ICD-10-CM | POA: Diagnosis not present

## 2018-05-10 ENCOUNTER — Ambulatory Visit (INDEPENDENT_AMBULATORY_CARE_PROVIDER_SITE_OTHER): Payer: BLUE CROSS/BLUE SHIELD | Admitting: Physician Assistant

## 2018-05-10 ENCOUNTER — Encounter: Payer: Self-pay | Admitting: Physician Assistant

## 2018-05-10 VITALS — BP 122/81 | HR 72 | Ht 69.0 in | Wt 278.6 lb

## 2018-05-10 DIAGNOSIS — I5042 Chronic combined systolic (congestive) and diastolic (congestive) heart failure: Secondary | ICD-10-CM

## 2018-05-10 DIAGNOSIS — I10 Essential (primary) hypertension: Secondary | ICD-10-CM

## 2018-05-10 DIAGNOSIS — Z9989 Dependence on other enabling machines and devices: Secondary | ICD-10-CM | POA: Diagnosis not present

## 2018-05-10 DIAGNOSIS — E119 Type 2 diabetes mellitus without complications: Secondary | ICD-10-CM | POA: Diagnosis not present

## 2018-05-10 DIAGNOSIS — G4733 Obstructive sleep apnea (adult) (pediatric): Secondary | ICD-10-CM

## 2018-05-10 DIAGNOSIS — I480 Paroxysmal atrial fibrillation: Secondary | ICD-10-CM | POA: Diagnosis not present

## 2018-05-10 MED ORDER — ROSUVASTATIN CALCIUM 5 MG PO TABS: 7.5000 mg | ORAL_TABLET | Freq: Every day | ORAL | 3 refills | Status: DC

## 2018-05-10 MED ORDER — NITROGLYCERIN 0.4 MG SL SUBL: 0.4000 mg | SUBLINGUAL_TABLET | SUBLINGUAL | 3 refills | Status: DC | PRN

## 2018-05-10 MED ORDER — LOSARTAN POTASSIUM 50 MG PO TABS
25.0000 mg | ORAL_TABLET | Freq: Every day | ORAL | 1 refills | Status: DC
Start: 2018-05-10 — End: 2018-06-07

## 2018-05-10 MED ORDER — FUROSEMIDE 40 MG PO TABS: 20.0000 mg | ORAL_TABLET | Freq: Every day | ORAL | 1 refills | Status: DC
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
## 2018-05-10 NOTE — Patient Instructions (Signed)
Medication Instructions:  Your physician recommends that you continue on your current medications as directed. Please refer to the Current Medication list given to you today.  Labwork: None   Testing/Procedures: None   Follow-Up: Follow up as scheduled with Dr Claiborne Billings.  Any Other Special Instructions Will Be Listed Below (If Applicable).  If you need a refill on your cardiac medications before your next appointment, please call your pharmacy.

## 2018-05-16 DIAGNOSIS — M25511 Pain in right shoulder: Secondary | ICD-10-CM | POA: Diagnosis not present

## 2018-05-18 DIAGNOSIS — M25511 Pain in right shoulder: Secondary | ICD-10-CM | POA: Diagnosis not present

## 2018-05-19 DIAGNOSIS — N2581 Secondary hyperparathyroidism of renal origin: Secondary | ICD-10-CM | POA: Diagnosis not present

## 2018-05-19 DIAGNOSIS — N189 Chronic kidney disease, unspecified: Secondary | ICD-10-CM | POA: Diagnosis not present

## 2018-05-19 DIAGNOSIS — N390 Urinary tract infection, site not specified: Secondary | ICD-10-CM | POA: Diagnosis not present

## 2018-05-19 DIAGNOSIS — N183 Chronic kidney disease, stage 3 (moderate): Secondary | ICD-10-CM | POA: Diagnosis not present

## 2018-05-19 DIAGNOSIS — I129 Hypertensive chronic kidney disease with stage 1 through stage 4 chronic kidney disease, or unspecified chronic kidney disease: Secondary | ICD-10-CM | POA: Diagnosis not present

## 2018-05-19 DIAGNOSIS — D631 Anemia in chronic kidney disease: Secondary | ICD-10-CM | POA: Diagnosis not present

## 2018-05-22 ENCOUNTER — Telehealth: Payer: Self-pay | Admitting: Cardiovascular Disease

## 2018-05-22 ENCOUNTER — Other Ambulatory Visit: Payer: Self-pay

## 2018-05-22 DIAGNOSIS — M25511 Pain in right shoulder: Secondary | ICD-10-CM | POA: Diagnosis not present

## 2018-05-22 MED ORDER — DAPAGLIFLOZIN PROPANEDIOL 5 MG PO TABS: 5.0000 mg | ORAL_TABLET | Freq: Every day | ORAL | 2 refills | Status: DC

## 2018-05-22 NOTE — Telephone Encounter (Signed)
Received records from [facility] on 05/22/18, Appt 06/07/18 @ 3:20PM. [place]

## 2018-05-23 DIAGNOSIS — M79642 Pain in left hand: Secondary | ICD-10-CM | POA: Diagnosis not present

## 2018-05-23 HISTORY — DX: Pain in left hand: M79.642

## 2018-05-30 ENCOUNTER — Other Ambulatory Visit: Payer: Self-pay | Admitting: Endocrinology

## 2018-05-30 DIAGNOSIS — M25511 Pain in right shoulder: Secondary | ICD-10-CM | POA: Diagnosis not present

## 2018-05-31 DIAGNOSIS — M19011 Primary osteoarthritis, right shoulder: Secondary | ICD-10-CM | POA: Diagnosis not present

## 2018-05-31 DIAGNOSIS — M1711 Unilateral primary osteoarthritis, right knee: Secondary | ICD-10-CM | POA: Diagnosis not present

## 2018-06-01 ENCOUNTER — Telehealth: Payer: Self-pay

## 2018-06-01 NOTE — Telephone Encounter (Signed)
    [facility] Pre-operative Risk Assessment    Request for surgical clearance:  1. What type of surgery is being performed? Right Knee TKA   2. When is this surgery scheduled? TBD   3. What type of clearance is required (medical clearance vs. Pharmacy clearance to hold med vs. Both)? Both  4. Are there any medications that need to be held prior to surgery and how long? Coumadin, Aspirin  5. Practice name and name of physician performing surgery? [facility]   6. What is your office phone number [phone] ([place])   7.   What is your office fax number [phone]  8.   Anesthesia type (None, local, MAC, general) ? Unspecified    Charles [name] 06/01/2018, 8:33 AM  _________________________________________________________________   (provider comments below)

## 2018-06-01 NOTE — Telephone Encounter (Signed)
Dr. Claiborne Billings, this pt has hx of NSTEMI with PTCA for ISS 07/2016, 3 stents in 08/2016 for instent restenosis, CABG in 08/2016. Also PAF.  He is planned for Knee replacement. Can aspirin be held?   I will contact pharmacy for input on holding his coumadin.   Please route response back to CV DIV PREOP.  Thank you

## 2018-06-05 DIAGNOSIS — M25511 Pain in right shoulder: Secondary | ICD-10-CM | POA: Diagnosis not present

## 2018-06-05 NOTE — Telephone Encounter (Signed)
Patient with diagnosis of AFIB on warfarin for anticoagulation.    Procedure: TKA Date of procedure: TBD  CHADS2-VASc score of  5 (CHF, HTN, AGE, DM2, CAD)  CrCl > 35ml/min  Per office protocol, patient can hold warfarin  for 5 days prior to procedure.  WILL NOT need bridging with Lovenox (enoxaparin) around procedure.  Patient should restart warfarin on the evening of procedure or day after, at discretion of procedure MD

## 2018-06-05 NOTE — Telephone Encounter (Signed)
Patient will be seen by me this week and will discuss at office visit

## 2018-06-07 ENCOUNTER — Encounter: Payer: Self-pay | Admitting: Cardiovascular Disease

## 2018-06-07 ENCOUNTER — Ambulatory Visit (INDEPENDENT_AMBULATORY_CARE_PROVIDER_SITE_OTHER): Payer: BLUE CROSS/BLUE SHIELD | Admitting: Cardiovascular Disease

## 2018-06-07 VITALS — BP 122/70 | HR 70 | Ht 69.0 in | Wt 282.4 lb

## 2018-06-07 DIAGNOSIS — G4733 Obstructive sleep apnea (adult) (pediatric): Secondary | ICD-10-CM | POA: Diagnosis not present

## 2018-06-07 DIAGNOSIS — Z951 Presence of aortocoronary bypass graft: Secondary | ICD-10-CM

## 2018-06-07 DIAGNOSIS — I5042 Chronic combined systolic (congestive) and diastolic (congestive) heart failure: Secondary | ICD-10-CM | POA: Diagnosis not present

## 2018-06-07 DIAGNOSIS — Z79899 Other long term (current) drug therapy: Secondary | ICD-10-CM

## 2018-06-07 DIAGNOSIS — I255 Ischemic cardiomyopathy: Secondary | ICD-10-CM

## 2018-06-07 DIAGNOSIS — I495 Sick sinus syndrome: Secondary | ICD-10-CM

## 2018-06-07 DIAGNOSIS — Z7901 Long term (current) use of anticoagulants: Secondary | ICD-10-CM | POA: Diagnosis not present

## 2018-06-07 DIAGNOSIS — N183 Chronic kidney disease, stage 3 unspecified: Secondary | ICD-10-CM

## 2018-06-07 DIAGNOSIS — N390 Urinary tract infection, site not specified: Secondary | ICD-10-CM | POA: Diagnosis not present

## 2018-06-07 MED ORDER — SACUBITRIL-VALSARTAN 24-26 MG PO TABS: 1.0000 | ORAL_TABLET | Freq: Two times a day (BID) | ORAL | 3 refills | Status: DC

## 2018-06-07 NOTE — Patient Instructions (Addendum)
Medication Instructions:  Stop Losartan Start Entresto 24-26 mg (1 tablet) two times daily Decrease furosemide to 20 mg (1/2 tablet) daily  Labwork: Please return for labs in 2-3 weeks (BMET, ProBNP)  Our in office lab hours are Monday-Friday 8:00-4:00, closed for lunch 12:45-1:45 pm.  No appointment needed.  Follow-Up: 4 weeks with pharmacist Delene Loll titration) 8 weeks with Dr. Claiborne Billings   Any Other Special Instructions Will Be Listed Below (If Applicable).     If you need a refill on your cardiac medications before your next appointment, please call your pharmacy.
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
## 2018-06-09 ENCOUNTER — Encounter: Payer: Self-pay | Admitting: Cardiovascular Disease

## 2018-06-09 DIAGNOSIS — M1711 Unilateral primary osteoarthritis, right knee: Secondary | ICD-10-CM | POA: Diagnosis not present

## 2018-06-14 ENCOUNTER — Telehealth: Payer: Self-pay | Admitting: Cardiovascular Disease

## 2018-06-14 DIAGNOSIS — G4733 Obstructive sleep apnea (adult) (pediatric): Secondary | ICD-10-CM | POA: Diagnosis not present

## 2018-06-14 DIAGNOSIS — I5042 Chronic combined systolic (congestive) and diastolic (congestive) heart failure: Secondary | ICD-10-CM | POA: Diagnosis not present

## 2018-06-14 DIAGNOSIS — I10 Essential (primary) hypertension: Secondary | ICD-10-CM | POA: Diagnosis not present

## 2018-06-14 NOTE — Telephone Encounter (Signed)
Spoke with pt and Dr Claiborne Billings started pt on Entresto  24-26 mg on 06-07-18 and pt has noted side effects dizziness, fatigue, nausea and low B/P latest was 80/55 .Instructed pt to hold Entresto and will forward message to Dr Claiborne Billings for review and recommendations ./cy

## 2018-06-14 NOTE — Telephone Encounter (Signed)
New Message   Pt c/o medication issue:  1. Name of Medication: sacubitril-valsartan (ENTRESTO) 24-26 MG  2. How are you currently taking this medication (dosage and times per day)?   3. Are you having a reaction (difficulty breathing--STAT)?   4. What is your medication issue? Patient is calling because since he has started taking this medication he feels like he is going to pass out or his BP is low. Please call.

## 2018-06-16 DIAGNOSIS — M25511 Pain in right shoulder: Secondary | ICD-10-CM | POA: Diagnosis not present

## 2018-06-16 NOTE — Telephone Encounter (Signed)
Agree to hold entresto

## 2018-06-16 NOTE — Telephone Encounter (Signed)
Follow up   Pt calling to check on the status of his medication issue. Please call

## 2018-06-16 NOTE — Telephone Encounter (Signed)
Called patient, LVMTCB to discuss recommendations from [facility]

## 2018-06-16 NOTE — Telephone Encounter (Signed)
Please advise for patient.  Thank you!

## 2018-06-19 ENCOUNTER — Other Ambulatory Visit (INDEPENDENT_AMBULATORY_CARE_PROVIDER_SITE_OTHER): Payer: BLUE CROSS/BLUE SHIELD

## 2018-06-19 DIAGNOSIS — E1165 Type 2 diabetes mellitus with hyperglycemia: Secondary | ICD-10-CM

## 2018-06-19 DIAGNOSIS — E875 Hyperkalemia: Secondary | ICD-10-CM | POA: Diagnosis not present

## 2018-06-19 DIAGNOSIS — Z794 Long term (current) use of insulin: Secondary | ICD-10-CM | POA: Diagnosis not present

## 2018-06-19 DIAGNOSIS — M25511 Pain in right shoulder: Secondary | ICD-10-CM | POA: Diagnosis not present

## 2018-06-19 DIAGNOSIS — E782 Mixed hyperlipidemia: Secondary | ICD-10-CM | POA: Diagnosis not present

## 2018-06-19 LAB — LIPID PANEL
CHOL/HDL RATIO: 2
Cholesterol: 91 mg/dL (ref 0–200)
HDL: 40.9 mg/dL (ref >39.00)
LDL CALC: 23 mg/dL (ref 0–99)
NONHDL: 49.79
Triglycerides: 135 mg/dL (ref 0.0–149.0)
VLDL: 27 mg/dL (ref 0.0–40.0)

## 2018-06-19 LAB — BASIC METABOLIC PANEL
BUN: 27 mg/dL — ABNORMAL HIGH (ref 6–23)
CHLORIDE: 105 meq/L (ref 96–112)
CO2: 27 mEq/L (ref 19–32)
CREATININE: 1.15 mg/dL (ref 0.40–1.50)
Calcium: 9.3 mg/dL (ref 8.4–10.5)
GFR: 66.54 mL/min (ref >60.00)
Glucose, Bld: 117 mg/dL — ABNORMAL HIGH (ref 70–99)
Potassium: 4.9 mEq/L (ref 3.5–5.1)
Sodium: 140 mEq/L (ref 135–145)

## 2018-06-19 LAB — HEMOGLOBIN A1C: Hgb A1c MFr Bld: 6.5 % (ref 4.6–6.5)

## 2018-06-19 NOTE — Telephone Encounter (Signed)
Follow up   Pt returning call for Almy[facility]. Please call

## 2018-06-19 NOTE — Telephone Encounter (Signed)
Spoke with pt, no need for lab work.

## 2018-06-19 NOTE — Telephone Encounter (Signed)
Follow up   Patient would also like to know if he still needs to get labs done, since he is holding the [place]. Please leave detailed message if no answer.

## 2018-06-19 NOTE — Telephone Encounter (Signed)
Spoke with pt and advised of Dr. Evette Georges recommendation to hold Entresto. Pt states he stop taking it last week and symptoms has resolved. Pt report has hasnt checked his BP but overall feels better. Pt advised to monitor BP with medication change. Pt also inquiring if Dr. Claiborne Billings would like for him to start back on losartan. Routing for recommendation.

## 2018-06-20 DIAGNOSIS — M79642 Pain in left hand: Secondary | ICD-10-CM | POA: Diagnosis not present

## 2018-06-20 NOTE — Addendum Note (Signed)
Addended by: Leland Johns A on: 06/20/2018 10:53 AM   Modules accepted: Orders

## 2018-06-22 DIAGNOSIS — M25511 Pain in right shoulder: Secondary | ICD-10-CM | POA: Diagnosis not present

## 2018-06-23 ENCOUNTER — Telehealth: Payer: Self-pay | Admitting: Cardiovascular Disease

## 2018-06-23 ENCOUNTER — Ambulatory Visit (INDEPENDENT_AMBULATORY_CARE_PROVIDER_SITE_OTHER): Payer: BLUE CROSS/BLUE SHIELD | Admitting: Endocrinology

## 2018-06-23 ENCOUNTER — Encounter: Payer: Self-pay | Admitting: Endocrinology

## 2018-06-23 VITALS — BP 138/80 | HR 88 | Ht 69.0 in | Wt 284.6 lb

## 2018-06-23 DIAGNOSIS — Z794 Long term (current) use of insulin: Secondary | ICD-10-CM

## 2018-06-23 DIAGNOSIS — E1165 Type 2 diabetes mellitus with hyperglycemia: Secondary | ICD-10-CM

## 2018-06-23 NOTE — Patient Instructions (Signed)
Schedule for Labs 2-3 days prior to office visit. ? ?
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
## 2018-06-23 NOTE — Telephone Encounter (Signed)
New message   1) What problem are you experiencing? Requesting order for new cpap  2) Who is your medical equipment company? [employer]   Please route to the sleep study assistant.

## 2018-06-23 NOTE — Telephone Encounter (Signed)
New message   Pt c/o BP issue: STAT if pt c/o blurred vision, one-sided weakness or slurred speech  1. What are your last 5 BP readings? 143/82,146/85,124/76,155/43,128/68,135/80  2. Are you having any other symptoms (ex. Dizziness, headache, blurred vision, passed out)? no 3. What is your BP issue? Patient calling to report BP

## 2018-06-23 NOTE — Telephone Encounter (Signed)
Returned a call to patient . He states that his CPAP machine is "making noises and red light is on." informed patient he will need appointment so MD can document the dysfunction in order to possibly get a replacement machine. Appointment given to see Dr Claiborne Billings on Monday 06/26/18.

## 2018-06-23 NOTE — Telephone Encounter (Signed)
The patient was calling to give his blood pressure readings. He stated that he has not been taking the Entresto due to side effects.  Blood pressures since 7/29: 143/82 146/85 124/76 155/43 128/68 135/80  Message has been routed to the provider for his knowledge.

## 2018-06-26 ENCOUNTER — Encounter: Payer: Self-pay | Admitting: Cardiovascular Disease

## 2018-06-26 ENCOUNTER — Ambulatory Visit (INDEPENDENT_AMBULATORY_CARE_PROVIDER_SITE_OTHER): Payer: BLUE CROSS/BLUE SHIELD | Admitting: Cardiovascular Disease

## 2018-06-26 VITALS — BP 134/78 | HR 71 | Ht 69.0 in | Wt 284.2 lb

## 2018-06-26 DIAGNOSIS — I255 Ischemic cardiomyopathy: Secondary | ICD-10-CM

## 2018-06-26 DIAGNOSIS — I5042 Chronic combined systolic (congestive) and diastolic (congestive) heart failure: Secondary | ICD-10-CM

## 2018-06-26 DIAGNOSIS — I495 Sick sinus syndrome: Secondary | ICD-10-CM | POA: Diagnosis not present

## 2018-06-26 DIAGNOSIS — Z951 Presence of aortocoronary bypass graft: Secondary | ICD-10-CM | POA: Diagnosis not present

## 2018-06-26 DIAGNOSIS — I10 Essential (primary) hypertension: Secondary | ICD-10-CM

## 2018-06-26 DIAGNOSIS — Z7901 Long term (current) use of anticoagulants: Secondary | ICD-10-CM

## 2018-06-26 DIAGNOSIS — G4733 Obstructive sleep apnea (adult) (pediatric): Secondary | ICD-10-CM

## 2018-06-26 DIAGNOSIS — I480 Paroxysmal atrial fibrillation: Secondary | ICD-10-CM

## 2018-06-26 MED ORDER — SACUBITRIL-VALSARTAN 24-26 MG PO TABS: 1.0000 | ORAL_TABLET | Freq: Two times a day (BID) | ORAL | 3 refills | Status: DC
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
## 2018-06-26 NOTE — Patient Instructions (Signed)
Medication Instructions:  STOP furosemide (Lasix) RESTART Entresto 24/26 mg two times daily --take 1 tablet daily for 2-3 days, if blood pressure okay increase to two times daily  Follow-Up: 1 month with pharmacist  September as scheduled with Dr. Claiborne Billings  Any Other Special Instructions Will Be Listed Below (If Applicable).  --orders will be sent for a new CPAP machine   If you need a refill on your cardiac medications before your next appointment, please call your pharmacy.

## 2018-06-27 DIAGNOSIS — M25511 Pain in right shoulder: Secondary | ICD-10-CM | POA: Diagnosis not present

## 2018-06-30 ENCOUNTER — Telehealth: Payer: Self-pay | Admitting: Cardiovascular Disease

## 2018-06-30 NOTE — Telephone Encounter (Signed)
Called the patient and LVM to call back to schedule 1 month followup with the pharmacist for [facility].

## 2018-07-02 ENCOUNTER — Encounter: Payer: Self-pay | Admitting: Cardiovascular Disease

## 2018-07-03 ENCOUNTER — Other Ambulatory Visit: Payer: Self-pay | Admitting: Cardiovascular Disease

## 2018-07-03 DIAGNOSIS — G5600 Carpal tunnel syndrome, unspecified upper limb: Secondary | ICD-10-CM | POA: Diagnosis not present

## 2018-07-03 DIAGNOSIS — G4733 Obstructive sleep apnea (adult) (pediatric): Secondary | ICD-10-CM

## 2018-07-06 DIAGNOSIS — I509 Heart failure, unspecified: Secondary | ICD-10-CM | POA: Diagnosis not present

## 2018-07-06 DIAGNOSIS — M170 Bilateral primary osteoarthritis of knee: Secondary | ICD-10-CM | POA: Diagnosis not present

## 2018-07-06 DIAGNOSIS — Z79899 Other long term (current) drug therapy: Secondary | ICD-10-CM | POA: Diagnosis not present

## 2018-07-06 DIAGNOSIS — E119 Type 2 diabetes mellitus without complications: Secondary | ICD-10-CM | POA: Diagnosis not present

## 2018-07-06 DIAGNOSIS — I2510 Atherosclerotic heart disease of native coronary artery without angina pectoris: Secondary | ICD-10-CM | POA: Diagnosis not present

## 2018-07-10 ENCOUNTER — Ambulatory Visit: Payer: BLUE CROSS/BLUE SHIELD

## 2018-07-10 ENCOUNTER — Telehealth: Payer: Self-pay | Admitting: Emergency Medicine

## 2018-07-10 DIAGNOSIS — N200 Calculus of kidney: Secondary | ICD-10-CM | POA: Diagnosis not present

## 2018-07-10 DIAGNOSIS — N390 Urinary tract infection, site not specified: Secondary | ICD-10-CM | POA: Diagnosis not present

## 2018-07-10 DIAGNOSIS — N401 Enlarged prostate with lower urinary tract symptoms: Secondary | ICD-10-CM | POA: Diagnosis not present

## 2018-07-10 DIAGNOSIS — N3941 Urge incontinence: Secondary | ICD-10-CM | POA: Diagnosis not present

## 2018-07-10 DIAGNOSIS — N5201 Erectile dysfunction due to arterial insufficiency: Secondary | ICD-10-CM | POA: Diagnosis not present

## 2018-07-10 DIAGNOSIS — B957 Other staphylococcus as the cause of diseases classified elsewhere: Secondary | ICD-10-CM | POA: Diagnosis not present

## 2018-07-10 NOTE — Telephone Encounter (Signed)
Pt called and wants to know if he can get a refill on his Insulin Syringe-Needle U-100 (B-D INS SYRINGE 0.5CC/30GX1/2") 30G X 1/2" 0.5 ML MISC. Pharmacy is [facility] [place]. Thanks.

## 2018-07-11 ENCOUNTER — Other Ambulatory Visit: Payer: Self-pay

## 2018-07-11 DIAGNOSIS — G5603 Carpal tunnel syndrome, bilateral upper limbs: Secondary | ICD-10-CM | POA: Diagnosis not present

## 2018-07-11 DIAGNOSIS — G5602 Carpal tunnel syndrome, left upper limb: Secondary | ICD-10-CM | POA: Diagnosis not present

## 2018-07-11 MED ORDER — "INSULIN SYRINGE-NEEDLE U-100 30G X 1/2"" 0.5 ML MISC": 2 refills | Status: DC

## 2018-07-11 NOTE — Telephone Encounter (Signed)
[facility].

## 2018-07-12 ENCOUNTER — Telehealth: Payer: Self-pay | Admitting: *Deleted

## 2018-07-12 DIAGNOSIS — I10 Essential (primary) hypertension: Secondary | ICD-10-CM | POA: Diagnosis not present

## 2018-07-12 DIAGNOSIS — I5042 Chronic combined systolic (congestive) and diastolic (congestive) heart failure: Secondary | ICD-10-CM | POA: Diagnosis not present

## 2018-07-12 DIAGNOSIS — G4733 Obstructive sleep apnea (adult) (pediatric): Secondary | ICD-10-CM | POA: Diagnosis not present

## 2018-07-12 NOTE — Telephone Encounter (Signed)
   [facility] Pre-operative Risk Assessment    Request for surgical clearance:  1. What type of surgery is being performed? Carpal tunnel release-left   2. When is this surgery scheduled? TBD   3. What type of clearance is required (medical clearance vs. Pharmacy clearance to hold med vs. Both)? both  4. Are there any medications that need to be held prior to surgery and how long? Coumadin, ASA  5. Practice name and name of physician performing surgery?  [facility] Dr. Gladstone Lighter   6. What is your office phone number [phone]    7.   What is your office fax number [phone] attention: [place] Chard  8.   Anesthesia type (None, local, MAC, general) ? general   Charles [name] Charles [name] 07/12/2018, 4:43 PM  _________________________________________________________________   (provider comments below)

## 2018-07-14 NOTE — Telephone Encounter (Signed)
New Message        Patient called to check status on surgery clearance. Pls advise.

## 2018-07-17 ENCOUNTER — Telehealth: Payer: Self-pay | Admitting: Cardiovascular Disease

## 2018-07-17 NOTE — Telephone Encounter (Signed)
Pharmacy has not received a clearance request yet, looks like this clearance is still in the Pre op pool.

## 2018-07-17 NOTE — Telephone Encounter (Signed)
New Message:    Pt is calling to check on the status of his clearance. Pt states he needs to have this surgery before the end of September for insurance purposes.

## 2018-07-18 NOTE — Telephone Encounter (Signed)
Left voice mail

## 2018-07-18 NOTE — Telephone Encounter (Signed)
Pt takes warfarin for afib with CHADS2VASc score of 5 (age, HTN, CAD, CHF, DM). Ok to hold warfarin for up to 5 days prior to procedure. Warfarin is now managed by [facility] in [place] - please forward as an FYI to their group.

## 2018-07-18 NOTE — Telephone Encounter (Signed)
This should be sent to whoever is handling clearance for his procedure. His CPAP issues has already been taken care of.

## 2018-07-19 ENCOUNTER — Telehealth: Payer: Self-pay | Admitting: Cardiovascular Disease

## 2018-07-19 NOTE — Telephone Encounter (Signed)
New Message:   Pt is calling to check on the status of his clearance.

## 2018-07-19 NOTE — Telephone Encounter (Signed)
Follow up ° ° ° °Patient is returning call in reference to pre-op clearance.  °

## 2018-07-19 NOTE — Telephone Encounter (Signed)
   Primary Cardiologist: Shelva Majestic, MD  Chart reviewed as part of pre-operative protocol coverage. Patient was contacted 07/19/2018 in reference to pre-operative risk assessment for pending surgery as outlined below.  LEEMAN JOHNSEY was last seen on 06/26/2018 by Dr. Claiborne Billings.  Since that day, KAIREE ISA has done well.  At his last office visit, he was changed from losartan to [facility].  He is not able to take Entresto twice a day due to side effects, but is tolerating it once a day.  He is compliant with the CPAP and sleeps well.  He feels like he gets adequate rest.  He is to take the Lasix as needed for swelling or weight gain, he has not needed it.  He has not had any chest pain.  He takes care of 2-1/2 acres.  He is active around the house and yard without symptoms.  Therefore, based on ACC/AHA guidelines, the patient would be at acceptable risk for the planned procedure without further cardiovascular testing.   Per the pharmacist:Pt takes warfarin for afib with CHADS2VASc score of 5 (age, HTN, CAD, CHF, DM). Ok to hold warfarin for up to 5 days prior to procedure. Warfarin is now managed by [facility] in [place] - please forward as an FYI to their group.  I will route this recommendation to the requesting party via Epic fax function and remove from pre-op pool.  Please call with questions.  Concepcion Kirkpatrick, PA-C 07/19/2018, 3:35 PM   5. [facility] Dr. Gladstone Lighter   6. What is your office phone number [phone]    7.   What is your office fax number [phone] attention: [place] Chard

## 2018-07-19 NOTE — Telephone Encounter (Signed)
Spoke to pre-op pool app . Will contact patient today. encounter cloesd

## 2018-07-25 ENCOUNTER — Other Ambulatory Visit: Payer: Self-pay

## 2018-07-25 ENCOUNTER — Encounter (HOSPITAL_COMMUNITY): Payer: Self-pay | Admitting: *Deleted

## 2018-07-25 NOTE — Progress Notes (Signed)
Spoke with dr Smith Robert anesthesia and reviewed ekg from 06-26-18 and lov cardiology 06-26-18 and chest xray 04-02-18, do not need to repeat chest xray or ekg per dr Smith Robert. Patient ok for surgery per dr Smith Robert anesthesia.

## 2018-07-25 NOTE — Progress Notes (Signed)
Requested medical clearance note nathan conroy pa and cardiac clearance note dr Barbaraann Rondo from dr gioffre left message with kelly hancock.

## 2018-07-25 NOTE — Progress Notes (Signed)
Need orders for 07-26-18 surgery in epic.

## 2018-07-25 NOTE — Progress Notes (Signed)
lov nathan conroy pa 07-06-18 on chart Medical clearance nathan conroy pa 07-06-18 on chart Cardiac clearance note rhonda barrett pa 07-19-18 on chart

## 2018-07-26 ENCOUNTER — Ambulatory Visit (HOSPITAL_COMMUNITY): Payer: BLUE CROSS/BLUE SHIELD | Admitting: Anesthesiology

## 2018-07-26 ENCOUNTER — Ambulatory Visit (HOSPITAL_COMMUNITY)
Admission: RE | Admit: 2018-07-26 | Discharge: 2018-07-26 | Disposition: A | Discharge status: Home / Self Care | Payer: BLUE CROSS/BLUE SHIELD | Source: Ambulatory Visit | Attending: Orthopedic Surgery | Admitting: Orthopedic Surgery

## 2018-07-26 ENCOUNTER — Encounter: Payer: BLUE CROSS/BLUE SHIELD | Admitting: Physician Assistant

## 2018-07-26 ENCOUNTER — Encounter (HOSPITAL_COMMUNITY)
Admission: RE | Disposition: A | Discharge status: Home / Self Care | Payer: Self-pay | Source: Ambulatory Visit | Attending: Orthopedic Surgery

## 2018-07-26 DIAGNOSIS — G5602 Carpal tunnel syndrome, left upper limb: Secondary | ICD-10-CM | POA: Diagnosis not present

## 2018-07-26 DIAGNOSIS — Z950 Presence of cardiac pacemaker: Secondary | ICD-10-CM | POA: Diagnosis not present

## 2018-07-26 DIAGNOSIS — Z955 Presence of coronary angioplasty implant and graft: Secondary | ICD-10-CM | POA: Diagnosis not present

## 2018-07-26 DIAGNOSIS — M19041 Primary osteoarthritis, right hand: Secondary | ICD-10-CM | POA: Insufficient documentation

## 2018-07-26 DIAGNOSIS — Z9049 Acquired absence of other specified parts of digestive tract: Secondary | ICD-10-CM | POA: Insufficient documentation

## 2018-07-26 DIAGNOSIS — M170 Bilateral primary osteoarthritis of knee: Secondary | ICD-10-CM | POA: Diagnosis not present

## 2018-07-26 DIAGNOSIS — M19042 Primary osteoarthritis, left hand: Secondary | ICD-10-CM | POA: Diagnosis not present

## 2018-07-26 DIAGNOSIS — Z951 Presence of aortocoronary bypass graft: Secondary | ICD-10-CM | POA: Insufficient documentation

## 2018-07-26 DIAGNOSIS — M479 Spondylosis, unspecified: Secondary | ICD-10-CM | POA: Diagnosis not present

## 2018-07-26 DIAGNOSIS — I252 Old myocardial infarction: Secondary | ICD-10-CM | POA: Diagnosis not present

## 2018-07-26 DIAGNOSIS — I480 Paroxysmal atrial fibrillation: Secondary | ICD-10-CM | POA: Insufficient documentation

## 2018-07-26 DIAGNOSIS — Z79899 Other long term (current) drug therapy: Secondary | ICD-10-CM | POA: Insufficient documentation

## 2018-07-26 DIAGNOSIS — Z86010 Personal history of colonic polyps: Secondary | ICD-10-CM | POA: Insufficient documentation

## 2018-07-26 DIAGNOSIS — Z794 Long term (current) use of insulin: Secondary | ICD-10-CM | POA: Diagnosis not present

## 2018-07-26 DIAGNOSIS — Z6841 Body Mass Index (BMI) 40.0 and over, adult: Secondary | ICD-10-CM | POA: Insufficient documentation

## 2018-07-26 DIAGNOSIS — I495 Sick sinus syndrome: Secondary | ICD-10-CM | POA: Diagnosis not present

## 2018-07-26 DIAGNOSIS — I129 Hypertensive chronic kidney disease with stage 1 through stage 4 chronic kidney disease, or unspecified chronic kidney disease: Secondary | ICD-10-CM | POA: Insufficient documentation

## 2018-07-26 DIAGNOSIS — N183 Chronic kidney disease, stage 3 (moderate): Secondary | ICD-10-CM | POA: Diagnosis not present

## 2018-07-26 DIAGNOSIS — Z7901 Long term (current) use of anticoagulants: Secondary | ICD-10-CM | POA: Insufficient documentation

## 2018-07-26 DIAGNOSIS — E1122 Type 2 diabetes mellitus with diabetic chronic kidney disease: Secondary | ICD-10-CM | POA: Insufficient documentation

## 2018-07-26 DIAGNOSIS — I25119 Atherosclerotic heart disease of native coronary artery with unspecified angina pectoris: Secondary | ICD-10-CM | POA: Insufficient documentation

## 2018-07-26 DIAGNOSIS — Z87891 Personal history of nicotine dependence: Secondary | ICD-10-CM | POA: Insufficient documentation

## 2018-07-26 DIAGNOSIS — Z7982 Long term (current) use of aspirin: Secondary | ICD-10-CM | POA: Insufficient documentation

## 2018-07-26 DIAGNOSIS — J45909 Unspecified asthma, uncomplicated: Secondary | ICD-10-CM | POA: Diagnosis not present

## 2018-07-26 DIAGNOSIS — G4733 Obstructive sleep apnea (adult) (pediatric): Secondary | ICD-10-CM | POA: Insufficient documentation

## 2018-07-26 HISTORY — DX: Cardiac arrhythmia, unspecified: I49.9

## 2018-07-26 HISTORY — PX: CARPAL TUNNEL RELEASE: SHX101

## 2018-07-26 LAB — COMPREHENSIVE METABOLIC PANEL
ALT: 19 U/L (ref 0–44)
AST: 25 U/L (ref 15–41)
Albumin: 3.8 g/dL (ref 3.5–5.0)
Alkaline Phosphatase: 57 U/L (ref 38–126)
Anion gap: 8 (ref 5–15)
BUN: 19 mg/dL (ref 8–23)
CO2: 22 mmol/L (ref 22–32)
Calcium: 8.9 mg/dL (ref 8.9–10.3)
Chloride: 107 mmol/L (ref 98–111)
Creatinine, Ser: 1.13 mg/dL (ref 0.61–1.24)
GFR calc Af Amer: >60 mL/min (ref >60)
GFR calc non Af Amer: >60 mL/min (ref >60)
Glucose, Bld: 145 mg/dL — ABNORMAL HIGH (ref 70–99)
Potassium: 5.2 mmol/L — ABNORMAL HIGH (ref 3.5–5.1)
Sodium: 137 mmol/L (ref 135–145)
Total Bilirubin: 1.3 mg/dL — ABNORMAL HIGH (ref 0.3–1.2)
Total Protein: 7.1 g/dL (ref 6.5–8.1)

## 2018-07-26 LAB — APTT: aPTT: 26 seconds (ref 24–36)

## 2018-07-26 LAB — CBC WITH DIFFERENTIAL/PLATELET
Basophils Absolute: 0 10*3/uL (ref 0.0–0.1)
Basophils Relative: 0 %
Eosinophils Absolute: 0.2 10*3/uL (ref 0.0–0.7)
Eosinophils Relative: 3 %
HCT: 43.7 % (ref 39.0–52.0)
Hemoglobin: 14.2 g/dL (ref 13.0–17.0)
Lymphocytes Relative: 24 %
Lymphs Abs: 1.6 10*3/uL (ref 0.7–4.0)
MCH: 29 pg (ref 26.0–34.0)
MCHC: 32.5 g/dL (ref 30.0–36.0)
MCV: 89.4 fL (ref 78.0–100.0)
Monocytes Absolute: 0.5 10*3/uL (ref 0.1–1.0)
Monocytes Relative: 8 %
Neutro Abs: 4.5 10*3/uL (ref 1.7–7.7)
Neutrophils Relative %: 65 %
Platelets: 196 10*3/uL (ref 150–400)
RBC: 4.89 MIL/uL (ref 4.22–5.81)
RDW: 15.7 % — ABNORMAL HIGH (ref 11.5–15.5)
WBC: 6.9 10*3/uL (ref 4.0–10.5)

## 2018-07-26 LAB — PROTIME-INR
INR: 0.95
Prothrombin Time: 12.6 seconds (ref 11.4–15.2)

## 2018-07-26 LAB — GLUCOSE, CAPILLARY: Glucose-Capillary: 117 mg/dL — ABNORMAL HIGH (ref 70–99)

## 2018-07-26 SURGERY — CARPAL TUNNEL RELEASE
Anesthesia: Monitor Anesthesia Care | Site: Wrist | Laterality: Left

## 2018-07-26 MED ORDER — FENTANYL CITRATE (PF) 100 MCG/2ML IJ SOLN
INTRAMUSCULAR | Status: AC
  Filled 2018-07-26: qty 2

## 2018-07-26 MED ORDER — PROPOFOL 10 MG/ML IV BOLUS
INTRAVENOUS | Status: AC
  Filled 2018-07-26: qty 40

## 2018-07-26 MED ORDER — HYDROMORPHONE HCL 2 MG PO TABS: 2.0000 mg | ORAL_TABLET | ORAL | 0 refills | Status: DC | PRN

## 2018-07-26 MED ORDER — FENTANYL CITRATE (PF) 100 MCG/2ML IJ SOLN: 25.0000 ug | INTRAMUSCULAR | Status: DC | PRN

## 2018-07-26 MED ORDER — LIDOCAINE HCL (PF) 1 % IJ SOLN
INTRAMUSCULAR | Status: AC
  Filled 2018-07-26: qty 30

## 2018-07-26 MED ORDER — LACTATED RINGERS IV SOLN
INTRAVENOUS | Status: DC
  Administered 2018-07-26: 14:00:00 via INTRAVENOUS

## 2018-07-26 MED ORDER — PROPOFOL 500 MG/50ML IV EMUL
INTRAVENOUS | Status: DC | PRN
  Administered 2018-07-26: 80 ug/kg/min via INTRAVENOUS

## 2018-07-26 MED ORDER — ONDANSETRON HCL 4 MG/2ML IJ SOLN
INTRAMUSCULAR | Status: DC | PRN
  Administered 2018-07-26: 4 mg via INTRAVENOUS

## 2018-07-26 MED ORDER — PROMETHAZINE HCL 25 MG/ML IJ SOLN: 6.2500 mg | INTRAMUSCULAR | Status: DC | PRN

## 2018-07-26 MED ORDER — PROPOFOL 10 MG/ML IV BOLUS
INTRAVENOUS | Status: DC | PRN
  Administered 2018-07-26: 20 mg via INTRAVENOUS
  Administered 2018-07-26: 50 mg via INTRAVENOUS
  Administered 2018-07-26 (×2): 20 mg via INTRAVENOUS

## 2018-07-26 MED ORDER — LIDOCAINE-EPINEPHRINE (PF) 1 %-1:200000 IJ SOLN
INTRAMUSCULAR | Status: AC
  Filled 2018-07-26: qty 30

## 2018-07-26 MED ORDER — LIDOCAINE HCL (PF) 1 % IJ SOLN
INTRAMUSCULAR | Status: DC | PRN
  Administered 2018-07-26: 10 mL

## 2018-07-26 MED ORDER — DEXTROSE 5 % IV SOLN
3.0000 g | INTRAVENOUS | Status: AC
  Administered 2018-07-26: 3 g via INTRAVENOUS
  Filled 2018-07-26: qty 3000
  Filled 2018-07-26: qty 3

## 2018-07-26 MED ORDER — FENTANYL CITRATE (PF) 100 MCG/2ML IJ SOLN
INTRAMUSCULAR | Status: DC | PRN
  Administered 2018-07-26: 25 ug via INTRAVENOUS

## 2018-07-26 MED ORDER — SODIUM CHLORIDE 0.9 % IR SOLN
Status: DC | PRN
  Administered 2018-07-26: 1000 mL

## 2018-07-26 SURGICAL SUPPLY — 28 items
BAG ZIPLOCK 12X15 (MISCELLANEOUS) ×3 IMPLANT
BANDAGE ACE 3X5.8 VEL STRL LF (GAUZE/BANDAGES/DRESSINGS) ×3 IMPLANT
BLADE SURG 15 STRL LF DISP TIS (BLADE) ×1 IMPLANT
BLADE SURG 15 STRL SS (BLADE) ×2
BNDG GAUZE ELAST 4 BULKY (GAUZE/BANDAGES/DRESSINGS) ×3 IMPLANT
CHLORAPREP W/TINT 26ML (MISCELLANEOUS) ×6 IMPLANT
COVER SURGICAL LIGHT HANDLE (MISCELLANEOUS) ×3 IMPLANT
CUFF TOURN SGL QUICK 18 (TOURNIQUET CUFF) ×3 IMPLANT
DECANTER SPIKE VIAL GLASS SM (MISCELLANEOUS) ×3 IMPLANT
DRSG EMULSION OIL 3X3 NADH (GAUZE/BANDAGES/DRESSINGS) ×3 IMPLANT
GAUZE 4X4 16PLY RFD (DISPOSABLE) ×3 IMPLANT
GAUZE SPONGE 4X4 12PLY STRL (GAUZE/BANDAGES/DRESSINGS) ×3 IMPLANT
GLOVE BIOGEL PI IND STRL 8.5 (GLOVE) ×1 IMPLANT
GLOVE BIOGEL PI IND STRL 9 (GLOVE) ×1 IMPLANT
GLOVE BIOGEL PI INDICATOR 8.5 (GLOVE) ×2
GLOVE BIOGEL PI INDICATOR 9 (GLOVE) ×2
GLOVE ECLIPSE 8.0 STRL XLNG CF (GLOVE) ×6 IMPLANT
GLOVE SURG ORTHO 8.5 STRL (GLOVE) ×3 IMPLANT
GOWN SPEC L3 XXLG W/TWL (GOWN DISPOSABLE) ×3 IMPLANT
GOWN STRL REUS W/TWL XL LVL3 (GOWN DISPOSABLE) ×3 IMPLANT
KIT BASIN OR (CUSTOM PROCEDURE TRAY) ×3 IMPLANT
MANIFOLD NEPTUNE II (INSTRUMENTS) ×3 IMPLANT
NEEDLE HYPO 22GX1.5 SAFETY (NEEDLE) ×3 IMPLANT
PACK ORTHO EXTREMITY (CUSTOM PROCEDURE TRAY) ×3 IMPLANT
PAD ABD 8X10 STRL (GAUZE/BANDAGES/DRESSINGS) ×3 IMPLANT
SUT ETHILON 3 0 PS 1 (SUTURE) ×6 IMPLANT
SYR CONTROL 10ML LL (SYRINGE) ×3 IMPLANT
TOWEL OR 17X26 10 PK STRL BLUE (TOWEL DISPOSABLE) ×6 IMPLANT

## 2018-07-26 NOTE — Brief Op Note (Signed)
07/26/2018  4:52 PM  PATIENT:  Charles [name]  72 y.o. male  PRE-OPERATIVE DIAGNOSIS:  left carpal tunnel syndrome  POST-OPERATIVE DIAGNOSIS:  left carpal tunnel syndrome  PROCEDURE:  Procedure(s): CARPAL TUNNEL RELEASE (Left)  SURGEON:  Surgeon(s) and Role:    Latanya Maudlin, MD - Primary     ASSISTANTS: OR Nurse  ANESTHESIA:   local and IV sedation  EBL:  0cc  BLOOD ADMINISTERED:none  DRAINS: none   LOCAL MEDICATIONS USED:  LIDOCAINE   SPECIMEN:  No Specimen  DISPOSITION OF SPECIMEN:  N/A  COUNTS:  YES  TOURNIQUET:   Total Tourniquet Time Documented: Upper Arm (Left) - 21 minutes Total: Upper Arm (Left) - 21 minutes   DICTATION: .Other Dictation: Dictation Number [id number]  PLAN OF CARE: Discharge to home after PACU  PATIENT DISPOSITION:  PACU - hemodynamically stable.   Delay start of Pharmacological VTE agent (>24hrs) due to surgical blood loss or risk of bleeding: yes

## 2018-07-26 NOTE — Interval H&P Note (Signed)
History and Physical Interval Note:  07/26/2018 2:03 PM  Charles [name]  has presented today for surgery, with the diagnosis of left carpal tunnel syndrome  The various methods of treatment have been discussed with the patient and family. After consideration of risks, benefits and other options for treatment, the patient has consented to  Procedure(s) with comments: CARPAL TUNNEL RELEASE (Left) - 79min as a surgical intervention .  The patient's history has been reviewed, patient examined, no change in status, stable for surgery.  I have reviewed the patient's chart and labs.  Questions were answered to the patient's satisfaction.     Latanya Maudlin

## 2018-07-26 NOTE — Transfer of Care (Signed)
Immediate Anesthesia Transfer of Care Note  Patient: Charles [name]  Procedure(s) Performed: Procedure(s): CARPAL TUNNEL RELEASE (Left)  Patient Location: PACU  Anesthesia Type:MAC  Level of Consciousness:  sedated, patient cooperative and responds to stimulation  Airway & Oxygen Therapy:Patient Spontanous Breathing and Patient connected to face mask oxgen  Post-op Assessment:  Report given to PACU RN and Post -op Vital signs reviewed and stable  Post vital signs:  Reviewed and stable  Last Vitals:  Vitals:   07/26/18 1308 07/26/18 1658  BP: (!) 141/88 122/83  Pulse: 69   Resp: 18   Temp: (!) 36.4 C   SpO2: 83%     Complications: No apparent anesthesia complications

## 2018-07-26 NOTE — Op Note (Signed)
NAME: Charles [name], Charles [name] MEDICAL RECORD [id number] ACCOUNT [id number] DATE OF BIRTH:1946-07-29 FACILITY: [facility] LOCATION: [facility] PHYSICIAN:Harkirat Orozco Fransico Setters, MD  OPERATIVE REPORT  DATE OF PROCEDURE:  07/26/2018  PREOPERATIVE DIAGNOSIS:  Left carpal tunnel syndrome, severe.  POSTOPERATIVE DIAGNOSIS:  Left carpal tunnel syndrome, severe.  DESCRIPTION OF PROCEDURE:  Left carpal tunnel release.  SURGEON:  Kipp Brood. Daviel Allegretto, MD.  ASSISTANT:  OR nurse.    DESCRIPTION OF PROCEDURE:  The appropriate time-out was carried out also marked the appropriate left arm in a holding area.  At this particular time, after sterile prep and draping and timeout,  I utilized the Esmarch to exsanguinate the right and left  upper extremity and the tourniquet was elevated  225 mmHg.  At this time, a curvilinear incision was made over the palmar aspect of the left hand.  Bleeders were cauterized with the bipolar.  I then went down and identified the deep and superficial transverse carpal ligaments.  I inserted the small  Weitlaner retractor and I protected the median nerve.  I made a small incision in the ligament.  I utilized a hemostat open the ligament and then I inserted a Freer up under the ligament to protect the median nerve and I incised the transverse carpal  ligament down into the palm and proximally as well.  The nerve now was free.  It was severely tense prior to the release.  We thoroughly irrigated out the area and closed the skin only with 3-0 nylon suture.  Sterile bundle dressing was applied.  He had  1 gram of IV Ancef.   We then applied a bundle dressing and we will have him discharged from the recovery room.  AN/NUANCE  D:07/26/2018 T:07/26/2018 JOB:[id number]

## 2018-07-26 NOTE — Anesthesia Preprocedure Evaluation (Addendum)
Anesthesia Evaluation  Patient identified by MRN, date of birth, ID band Patient awake    Reviewed: Allergy & Precautions, NPO status , Patient's Chart, lab work & pertinent test results  Airway Mallampati: II  TM Distance: >3 FB Neck ROM: Full    Dental no notable dental hx.    Pulmonary sleep apnea and Continuous Positive Airway Pressure Ventilation , former smoker,    Pulmonary exam normal breath sounds clear to auscultation       Cardiovascular hypertension, + CAD, + Past MI, + Cardiac Stents, + CABG and +CHF  + dysrhythmias Atrial Fibrillation + pacemaker  Rhythm:Regular Rate:Normal  ------------------------------------------------------------------- Left ventricle:  Septal and apical akinesis inferior wall hypokinesis. The cavity size was moderately dilated. Wall thickness was increased in a pattern of moderate LVH. The estimated ejection fraction was 30%. The transmitral flow pattern was normal. The deceleration time of the early transmitral flow velocity was normal. The pulmonary vein flow pattern was normal. The tissue Doppler parameters were normal. Left ventricular diastolic function parameters were normal.  ------------------------------------------------------------------- Aortic valve:   Trileaflet; mildly thickened, mildly calcified leaflets.  Doppler:   There was no stenosis.      Peak velocity ratio of LVOT to aortic valve: 0.7. Valve area (Vmax): 2.2 cm^2. Indexed valve area (Vmax): 0.88 cm^2/m^2.    Peak gradient (S): 6 mm Hg.  ------------------------------------------------------------------- Aorta:  Ascending aorta: The ascending aorta was mildly dilated.  ------------------------------------------------------------------- Mitral valve:   Calcified annulus. Mildly thickened leaflets . Doppler:  There was moderate regurgitation.    Peak gradient (D): 2 mm  Hg.  ------------------------------------------------------------------- Left atrium:  The atrium was moderately dilated.  ------------------------------------------------------------------- Atrial septum:  No defect or patent foramen ovale was identified.   ------------------------------------------------------------------- Right ventricle:  The cavity size was normal. Wall thickness was normal. Pacer wire or catheter noted in right ventricle. Systolic function was normal.  ------------------------------------------------------------------- Pulmonic valve:    Doppler:  There was mild regurgitation.  ------------------------------------------------------------------- Tricuspid valve:   Doppler:  There was mild regurgitation.  ------------------------------------------------------------------- Right atrium:  The atrium was normal in size. Pacer wire or catheter noted in right atrium.    Neuro/Psych negative neurological ROS  negative psych ROS   GI/Hepatic negative GI ROS, Neg liver ROS,   Endo/Other  diabetes, Insulin DependentMorbid obesity  Renal/GU Renal InsufficiencyRenal disease  negative genitourinary   Musculoskeletal negative musculoskeletal ROS (+)   Abdominal   Peds negative pediatric ROS (+)  Hematology negative hematology ROS (+)   Anesthesia Other Findings   Reproductive/Obstetrics negative OB ROS                            Anesthesia Physical Anesthesia Plan  ASA: IV  Anesthesia Plan: MAC   Post-op Pain Management:    Induction: Intravenous  PONV Risk Score and Plan: 0  Airway Management Planned: Simple Face Mask  Additional Equipment:   Intra-op Plan:   Post-operative Plan:   Informed Consent: I have reviewed the patients History and Physical, chart, labs and discussed the procedure including the risks, benefits and alternatives for the proposed anesthesia with the patient or authorized representative  who has indicated his/her understanding and acceptance.   Dental advisory given  Plan Discussed with: CRNA and Surgeon  Anesthesia Plan Comments:         Anesthesia Quick Evaluation
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
## 2018-07-26 NOTE — Anesthesia Postprocedure Evaluation (Signed)
Anesthesia Post Note  Patient: Charles [name]  Procedure(s) Performed: CARPAL TUNNEL RELEASE (Left Wrist)     Patient location during evaluation: PACU Anesthesia Type: MAC Level of consciousness: awake and alert, awake and oriented Pain management: pain level controlled Vital Signs Assessment: post-procedure vital signs reviewed and stable Respiratory status: spontaneous breathing, nonlabored ventilation and respiratory function stable Cardiovascular status: stable and blood pressure returned to baseline Postop Assessment: no apparent nausea or vomiting Anesthetic complications: no    Last Vitals:  Vitals:   07/26/18 1745 07/26/18 1800  BP: (!) 141/79 (!) 145/86  Pulse: 69 75  Resp: 12 16  Temp: 36.6 C 36.7 C  SpO2: 99% 98%    Last Pain:  Vitals:   07/26/18 1800  TempSrc:   PainSc: 0-No pain                 Catalina Gravel

## 2018-07-27 ENCOUNTER — Encounter (HOSPITAL_COMMUNITY): Payer: Self-pay | Admitting: Orthopedic Surgery

## 2018-07-28 ENCOUNTER — Other Ambulatory Visit: Payer: Self-pay | Admitting: Endocrinology

## 2018-07-28 ENCOUNTER — Telehealth: Payer: Self-pay

## 2018-07-28 NOTE — Telephone Encounter (Signed)
Is this okay to refill? 

## 2018-07-28 NOTE — Telephone Encounter (Signed)
This is not a controlled substance and you can send it in

## 2018-07-28 NOTE — Telephone Encounter (Signed)
All prescriptions in my name can be refilled

## 2018-07-28 NOTE — Telephone Encounter (Signed)
Can you please refill gabapentin patient called today requesting it

## 2018-07-31 ENCOUNTER — Other Ambulatory Visit: Payer: Self-pay

## 2018-07-31 ENCOUNTER — Other Ambulatory Visit: Payer: Self-pay | Admitting: Endocrinology

## 2018-08-01 DIAGNOSIS — B957 Other staphylococcus as the cause of diseases classified elsewhere: Secondary | ICD-10-CM | POA: Diagnosis not present

## 2018-08-01 DIAGNOSIS — N390 Urinary tract infection, site not specified: Secondary | ICD-10-CM | POA: Diagnosis not present

## 2018-08-01 NOTE — Telephone Encounter (Signed)
This has been ordered 

## 2018-08-07 ENCOUNTER — Ambulatory Visit (INDEPENDENT_AMBULATORY_CARE_PROVIDER_SITE_OTHER): Payer: BLUE CROSS/BLUE SHIELD | Admitting: Pharmacist

## 2018-08-07 VITALS — BP 118/68 | HR 74 | Ht 69.0 in | Wt 287.2 lb

## 2018-08-07 DIAGNOSIS — I255 Ischemic cardiomyopathy: Secondary | ICD-10-CM | POA: Diagnosis not present

## 2018-08-07 DIAGNOSIS — I5042 Chronic combined systolic (congestive) and diastolic (congestive) heart failure: Secondary | ICD-10-CM | POA: Diagnosis not present

## 2018-08-07 DIAGNOSIS — Z79899 Other long term (current) drug therapy: Secondary | ICD-10-CM | POA: Diagnosis not present

## 2018-08-07 DIAGNOSIS — N183 Chronic kidney disease, stage 3 (moderate): Secondary | ICD-10-CM | POA: Diagnosis not present

## 2018-08-07 NOTE — Patient Instructions (Signed)
Return for a follow up appointment in 9 days with Dr. Claiborne Billings  Check your blood pressure at home daily (if able) and keep record of the readings.  Take your BP meds as follows: No changes  Bring all of your meds, your BP cuff and your record of home blood pressures to your next appointment.  Exercise as you're able, try to walk approximately 30 minutes per day.  Keep salt intake to a minimum, especially watch canned and prepared boxed foods.  Eat more fresh fruits and vegetables and fewer canned items.  Avoid eating in fast food restaurants.    HOW TO TAKE YOUR BLOOD PRESSURE: . Rest 5 minutes before taking your blood pressure. .  Don't smoke or drink caffeinated beverages for at least 30 minutes before. . Take your blood pressure before (not after) you eat. . Sit comfortably with your back supported and both feet on the floor (don't cross your legs). . Elevate your arm to heart level on a table or a desk. . Use the proper sized cuff. It should fit smoothly and snugly around your bare upper arm. There should be enough room to slip a fingertip under the cuff. The bottom edge of the cuff should be 1 inch above the crease of the elbow. . Ideally, take 3 measurements at one sitting and record the average.

## 2018-08-07 NOTE — Progress Notes (Signed)
Patient ID: PRATHER FAILLA                 DOB: 08/12/1946                      MRN: [id number]     HPI: Charles [name] is a 72 y.o. male referred by Dr. Claiborne Billings to HTN clinic.Patient is following up for possible Entresto titration. Denies dizziness, headache, fatigue, swelling or SOB. He reported feeling light headed while taking Entresto twice daily so Dr. Claiborne Billings reduced the dose to once daily per patient. Charles [name] is currently recovering from carpal tunnel surgery and reports sharp pain in his wrist occasionally. He is not taking anything for this pain as it is just when he moves his hand a certain way.  Current HTN meds: valsartan/sacubatril  Previously tried:  losartan lisinopril  BP goal: <130/80  Family History: Hypertension in his sister; Lung cancer in his maternal grandfather; Stroke in his maternal grandmother and paternal grandfather.  Social History: Former smoker, denies alcohol use. Married with 1 child.  Diet: Patient reports a low-salt diet and eats   Exercise: Reports walking on a treadmill daily. Notes that this bothers his arthritis in his knees, so he is thinking about bike riding.   Home BP readings: Patient reports home BPs with systolic readings ranging from 120-130.  Wt Readings from Last 3 Encounters:  08/07/18 287 lb 3.2 oz (130.3 kg)  07/26/18 281 lb 4 oz (127.6 kg)  06/26/18 284 lb 3.2 oz (128.9 kg)   BP Readings from Last 3 Encounters:  08/07/18 118/68  07/26/18 (!) 145/86  06/26/18 134/78   Pulse Readings from Last 3 Encounters:  08/07/18 74  07/26/18 75  06/26/18 71    Past Medical History:  Diagnosis Date  . Acute cystitis with hematuria 12/23/2016  . Allergy   . Anemia    on iron  . Arthritis    "knees, hands, lower back" (07/29/2016)  . Asthma    "touch q once & awhile" (02/05/2016)  . Cataract    left eye  . Chronic bronchitis ([place])   . Chronic venous insufficiency    with prior venous stasis ulcers x 1 2013  . Complication of  anesthesia    "when coming out, I choke and get very restless if breathing tube is still in"  . Coronary artery disease    a. history of multiple stents to the LCx, LAD, and RCA b. s/p CABG in 08/2016 with LIMA-LAD, SVG-OM, SVG-PDA, and SVG-D1  . Diabetes mellitus, type II ([place])    type 2  . Dysrhythmia    atrial fib for once few yrs ago  . Elevated creatine kinase level 2018  . Exogenous obesity    severe  . Helicobacter pylori gastritis 2016  . History of blood transfusion ~ 2015   related to "when they went in to get my kidney stones"  . History of kidney stones   . Hx of colonic polyps 09/2006   inflammatory polyp at hepatic flexure. not adenomatous or malignant.   . Hyperlipidemia   . Hypertension   . LBBB (left bundle branch block)    He has developed a native LBBB which was seen on his last visit of March 2014 (From OV note 07/03/13)   . Long term (current) use of anticoagulants   . MI (myocardial infarction) ([place]) 1995   "mild"  . Nephrolithiasis    sees [facility], sees every 4 months  dr. deterding ckd stage 3  . OSA on CPAP    "nasal CPAP" (07/29/2016) patient does not know settings   . Osteoarthritis, knee   . PAF (paroxysmal atrial fibrillation) ([place])    a. on Xarelto  . 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 Zephyr XL DR 5826, dual chamber, rate responsive. No arrhythmias recorded and he has an excellent threshold.  . Rotator cuff tear last 2 years   right   . Sleep apnea   . SSS (sick sinus syndrome) ([place])   . Statin intolerance    Hx of. Now tolerating Zetia & Livalo well.   [name] Unstable angina ([place]) 08/2016   mild, none recent    Current Outpatient Medications on File Prior to Visit  Medication Sig Dispense Refill  . alfuzosin (UROXATRAL) 10 MG 24 hr tablet Take 10 mg by mouth daily with breakfast.    . aspirin EC 81 MG EC tablet Take 1 tablet (81 mg total) by mouth daily.    . Coenzyme Q10 (CO Q-10) 100 MG CAPS Take 100 mg by mouth  daily.     . CONTOUR NEXT TEST test strip CHECK BLOOD SUGAR 3 TIMES DAILY 100 each 5  . dapagliflozin propanediol (FARXIGA) 5 MG TABS tablet Take 5 mg by mouth daily. 30 tablet 2  . ferrous gluconate (IRON 27) 240 (27 FE) MG tablet Take 120 mg by mouth daily.    [name] gabapentin (NEURONTIN) 300 MG capsule TAKE ONE CAPSULE BY MOUTH THREE TIMES DAILY 90 capsule 3  . insulin aspart (NOVOLOG) 100 UNIT/ML injection Inject 15 units at breakfast, 20 at lunch, and 20-25 at supper (Patient taking differently: Inject 15-25 Units into the skin See admin instructions. Inject 15 units SQ at breakfast, inject 20 units SQ at lunch, and inject 25 SQ at supper) 20 mL 11  . Insulin Degludec (TRESIBA) 100 UNIT/ML SOLN Inject 42 Units into the skin daily.     . metFORMIN (GLUCOPHAGE-XR) 500 MG 24 hr tablet Take 3 tablets (1,500 mg total) daily with supper by mouth. (Patient taking differently: Take 1,500 mg by mouth every evening. ) 270 tablet 3  . nitroGLYCERIN (NITROSTAT) 0.4 MG SL tablet Place 1 tablet (0.4 mg total) under the tongue as needed. For chest pain (Patient taking differently: Place 0.4 mg under the tongue as needed for chest pain. ) 25 tablet 3  . rosuvastatin (CRESTOR) 5 MG tablet Take 1.5 tablets (7.5 mg total) by mouth daily. (Patient taking differently: Take 7.5 mg by mouth at bedtime. ) 135 tablet 3  . sacubitril-valsartan (ENTRESTO) 24-26 MG Take 1 tablet by mouth 2 (two) times daily. (Patient taking differently: Take 1 tablet by mouth daily. ) 60 tablet 3  . sotalol (BETAPACE) 120 MG tablet Take 1 tablet (120 mg total) by mouth 2 (two) times daily. 180 tablet 3  . VENTOLIN HFA 108 (90 Base) MCG/ACT inhaler Inhale 2 puffs into the lungs 2 (two) times daily as needed for wheezing or shortness of breath.   0  . warfarin (COUMADIN) 5 MG tablet Take 1 to 2 tablets by mouth daily as directed by coumadin clinic (Patient taking differently: Take 2.5-5 mg by mouth See admin instructions. Take 5 mg by mouth daily  on Monday. Take 2.5 mg by mouth daily on all other days.) 60 tablet 1  . HYDROmorphone (DILAUDID) 2 MG tablet Take 1 tablet (2 mg total) by mouth every 4 (four) hours as needed for severe pain. (Patient not taking:  Reported on 08/07/2018) 42 tablet 0   No current facility-administered medications on file prior to visit.     Allergies  Allergen Reactions  . Chocolate Hives, Shortness Of Breath and Swelling  . Statins Other (See Comments)    Mental changes, muscle aches  . Black Pepper [[place]] Other (See Comments)    Irritates back of throat  . Codeine Itching  . Oxytetracycline Other (See Comments)    Flushing in sunlight  . Tape Rash and Other (See Comments)    SKIN IS VERY SENSITIVE!!    Blood pressure 118/68, pulse 74, height 5\' 9"  (1.753 m), weight 287 lb 3.2 oz (130.3 kg), SpO2 97 %.  Chronic combined systolic and diastolic CHF (congestive heart failure) (HCC) Blood pressure stable at 118/68 with pulse at 74 bpm. Patient had a BMET drawn today before this visit. Will continue to monitor since K was 5.2 on last BMET. He is taking sotalol per cardiac electrophysician orders and is not a candidate for another beta-blocker. Patient is not currently a candidate for spironolactone either due to elevated K level. He reports feeling like he was about to pass out when he increased his Entresto dose to twice daily limiting further titration. Therefore, we recommended keeping Entresto 24/26mg  and splitting it into 2 separate doses to get 24 hour coverage. Patient reports exercising regularly and controlling his diet. There are no further changes at this time, will review today's BMET, and follow-up with patient if necessary.     Jonette Eva  PharmD Candidate 08/07/2018 1:20 PM   Present during office visit. Assessment and plan discussed and approved Renatha Rosen Rodriguez-Guzman PharmD, BCPS, [facility] [address] [place] [address] 08/07/2018 1:20 PM

## 2018-08-07 NOTE — Assessment & Plan Note (Addendum)
Blood pressure stable at 118/68 with pulse at 74 bpm. Patient had a BMET drawn today before this visit. Will continue to monitor since K was 5.2 on last BMET. He is taking sotalol per cardiac electrophysician orders and is not a candidate for another beta-blocker. Patient is not currently a candidate for spironolactone either due to elevated K level. He reports feeling like he was about to pass out when he increased his Entresto dose to twice daily limiting further titration. Therefore, we recommended keeping Entresto 24/26mg  and splitting it into 2 separate doses to get 24 hour coverage. Patient reports exercising regularly and controlling his diet. There are no further changes at this time, will review today's BMET, and follow-up with patient if necessary.

## 2018-08-08 LAB — BASIC METABOLIC PANEL
BUN / CREAT RATIO: 17 (ref 10–24)
BUN: 20 mg/dL (ref 8–27)
CHLORIDE: 106 mmol/L (ref 96–106)
CO2: 19 mmol/L — AB (ref 20–29)
Calcium: 8.9 mg/dL (ref 8.6–10.2)
Creatinine, Ser: 1.16 mg/dL (ref 0.76–1.27)
GFR calc Af Amer: 73 mL/min/{1.73_m2} (ref >59)
GFR calc non Af Amer: 63 mL/min/{1.73_m2} (ref >59)
GLUCOSE: 134 mg/dL — AB (ref 65–99)
Potassium: 5.1 mmol/L (ref 3.5–5.2)
Sodium: 141 mmol/L (ref 134–144)

## 2018-08-08 LAB — PRO B NATRIURETIC PEPTIDE: NT-Pro BNP: 739 pg/mL — ABNORMAL HIGH (ref 0–376)

## 2018-08-11 ENCOUNTER — Telehealth: Payer: Self-pay | Admitting: Cardiovascular Disease

## 2018-08-11 NOTE — Telephone Encounter (Signed)
New message    No message necessary

## 2018-08-12 DIAGNOSIS — G4733 Obstructive sleep apnea (adult) (pediatric): Secondary | ICD-10-CM | POA: Diagnosis not present

## 2018-08-16 ENCOUNTER — Ambulatory Visit: Payer: BLUE CROSS/BLUE SHIELD | Admitting: Cardiovascular Disease

## 2018-08-20 ENCOUNTER — Other Ambulatory Visit: Payer: Self-pay | Admitting: Endocrinology

## 2018-08-23 DIAGNOSIS — N183 Chronic kidney disease, stage 3 (moderate): Secondary | ICD-10-CM | POA: Diagnosis not present

## 2018-08-24 ENCOUNTER — Telehealth: Payer: Self-pay | Admitting: Cardiovascular Disease

## 2018-08-24 MED ORDER — SACUBITRIL-VALSARTAN 24-26 MG PO TABS: 1.0000 | ORAL_TABLET | Freq: Two times a day (BID) | ORAL | 6 refills | Status: DC

## 2018-08-24 NOTE — Telephone Encounter (Signed)
SPOKE TO PATIENT. INFORMED HIM  PRESCRIPTION HAS BEEN SENT TO PHARMACIST.[name] PATIENT STATES ;HE IS TAKING ONE TABLET PER DAY , DR Claiborne Billings 'S ORDER.  PATIENT STATES HE HAS NOT HAD RX ,HE HAD BEEN RECEIVING SAMPLES.

## 2018-08-24 NOTE — Telephone Encounter (Signed)
°*  STAT* If patient is at the pharmacy, call can be transferred to refill team.   1. Which medications need to be refilled? (please list name of each medication and dose if known) Entresto  2. Which pharmacy/location (including street and city if local pharmacy) is medication to be sent to? [phone]  3. Do they need a 30 day or 90 day supply? 30 and refills
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
## 2018-08-29 ENCOUNTER — Ambulatory Visit: Payer: Self-pay | Admitting: Pharmacist Clinician (PhC)/ Clinical Pharmacy Specialist

## 2018-08-29 ENCOUNTER — Ambulatory Visit (INDEPENDENT_AMBULATORY_CARE_PROVIDER_SITE_OTHER): Payer: BLUE CROSS/BLUE SHIELD | Admitting: Physician Assistant

## 2018-08-29 ENCOUNTER — Encounter: Payer: Self-pay | Admitting: Physician Assistant

## 2018-08-29 VITALS — BP 138/70 | HR 69 | Ht 69.0 in | Wt 285.0 lb

## 2018-08-29 DIAGNOSIS — I5022 Chronic systolic (congestive) heart failure: Secondary | ICD-10-CM

## 2018-08-29 DIAGNOSIS — I110 Hypertensive heart disease with heart failure: Secondary | ICD-10-CM | POA: Diagnosis not present

## 2018-08-29 DIAGNOSIS — I2510 Atherosclerotic heart disease of native coronary artery without angina pectoris: Secondary | ICD-10-CM

## 2018-08-29 DIAGNOSIS — I4891 Unspecified atrial fibrillation: Secondary | ICD-10-CM

## 2018-08-29 DIAGNOSIS — I10 Essential (primary) hypertension: Secondary | ICD-10-CM

## 2018-08-29 DIAGNOSIS — I5042 Chronic combined systolic (congestive) and diastolic (congestive) heart failure: Secondary | ICD-10-CM | POA: Diagnosis not present

## 2018-08-29 DIAGNOSIS — I119 Hypertensive heart disease without heart failure: Secondary | ICD-10-CM | POA: Diagnosis not present

## 2018-08-29 DIAGNOSIS — Z7901 Long term (current) use of anticoagulants: Secondary | ICD-10-CM

## 2018-08-29 DIAGNOSIS — R5383 Other fatigue: Secondary | ICD-10-CM | POA: Diagnosis not present

## 2018-08-29 DIAGNOSIS — G4733 Obstructive sleep apnea (adult) (pediatric): Secondary | ICD-10-CM

## 2018-08-29 LAB — TSH: TSH: 1.88 u[IU]/mL (ref 0.450–4.500)

## 2018-08-29 MED ORDER — SACUBITRIL-VALSARTAN 24-26 MG PO TABS: 0.5000 | ORAL_TABLET | Freq: Two times a day (BID) | ORAL | 6 refills | Status: DC

## 2018-08-29 NOTE — Patient Instructions (Signed)
Medication Instructions:  Your physician recommends that you continue on your current medications as directed. Please refer to the Current Medication list given to you today.  If you need a refill on your cardiac medications before your next appointment, please call your pharmacy.   Lab work: Your physician recommends that you return for lab work in: [place] If you have labs (blood work) drawn today and your tests are completely normal, you will receive your results only by: [name] MyChart Message (if you have MyChart) OR . A paper copy in the mail If you have any lab test that is abnormal or we need to change your treatment, we will call you to review the results.  Testing/Procedures: NONE   Follow-Up: At [facility], you and your health needs are our priority.  As part of our continuing mission to provide you with exceptional heart care, we have created designated Provider Care Teams.  These Care Teams include your primary Cardiologist (physician) and Advanced Practice Providers (APPs -  Physician Assistants and Nurse Practitioners) who all work together to provide you with the care you need, when you need it. You will need a follow up appointment in 6 months.  Please call our office 2 months in advance to schedule this appointment.  You may see Shelva Majestic, MD or one of the following Advanced Practice Providers on your designated Care Team: [place], [place] . Fabian Sharp, PA-C  Any Other Special Instructions Will Be Listed Below (If Applicable). Review diets plans below     DASH Eating Plan DASH stands for "Dietary Approaches to Stop Hypertension." The DASH eating plan is a healthy eating plan that has been shown to reduce high blood pressure (hypertension). It may also reduce your risk for type 2 diabetes, heart disease, and stroke. The DASH eating plan may also help with weight loss. What are tips for following this plan? General guidelines  Avoid eating more than 2,300 mg  (milligrams) of salt (sodium) a day. If you have hypertension, you may need to reduce your sodium intake to 1,500 mg a day.  Limit alcohol intake to no more than 1 drink a day for nonpregnant women and 2 drinks a day for men. One drink equals 12 oz of beer, 5 oz of wine, or 1 oz of hard liquor.  Work with your health care provider to maintain a healthy body weight or to lose weight. Ask what an ideal weight is for you.  Get at least 30 minutes of exercise that causes your heart to beat faster (aerobic exercise) most days of the week. Activities may include walking, swimming, or biking.  Work with your health care provider or diet and nutrition specialist (dietitian) to adjust your eating plan to your individual calorie needs. Reading food labels  Check food labels for the amount of sodium per serving. Choose foods with less than 5 percent of the Daily Value of sodium. Generally, foods with less than 300 mg of sodium per serving fit into this eating plan.  To find whole grains, look for the word "whole" as the first word in the ingredient list. Shopping  Buy products labeled as "low-sodium" or "no salt added."  Buy fresh foods. Avoid canned foods and premade or frozen meals. Cooking  Avoid adding salt when cooking. Use salt-free seasonings or herbs instead of table salt or sea salt. Check with your health care provider or pharmacist before using salt substitutes.  Do not fry foods. Cook foods using healthy methods such as  baking, boiling, grilling, and broiling instead.  Cook with heart-healthy oils, such as olive, canola, soybean, or sunflower oil. Meal planning   Eat a balanced diet that includes: ? 5 or more servings of fruits and vegetables each day. At each meal, try to fill half of your plate with fruits and vegetables. ? Up to 6-8 servings of whole grains each day. ? Less than 6 oz of lean meat, poultry, or fish each day. A 3-oz serving of meat is about the same size as a deck  of cards. One egg equals 1 oz. ? 2 servings of low-fat dairy each day. ? A serving of nuts, seeds, or beans 5 times each week. ? Heart-healthy fats. Healthy fats called Omega-3 fatty acids are found in foods such as flaxseeds and coldwater fish, like sardines, salmon, and mackerel.  Limit how much you eat of the following: ? Canned or prepackaged foods. ? Food that is high in trans fat, such as fried foods. ? Food that is high in saturated fat, such as fatty meat. ? Sweets, desserts, sugary drinks, and other foods with added sugar. ? Full-fat dairy products.  Do not salt foods before eating.  Try to eat at least 2 vegetarian meals each week.  Eat more home-cooked food and less restaurant, buffet, and fast food.  When eating at a restaurant, ask that your food be prepared with less salt or no salt, if possible. What foods are recommended? The items listed may not be a complete list. Talk with your dietitian about what dietary choices are best for you. Grains Whole-grain or whole-wheat bread. Whole-grain or whole-wheat pasta. Brown rice. [place]. Bulgur. Whole-grain and low-sodium cereals. Pita bread. Low-fat, low-sodium crackers. Whole-wheat flour tortillas. Vegetables Fresh or frozen vegetables (raw, steamed, roasted, or grilled). Low-sodium or reduced-sodium tomato and vegetable juice. Low-sodium or reduced-sodium tomato sauce and tomato paste. Low-sodium or reduced-sodium canned vegetables. Fruits All fresh, dried, or frozen fruit. Canned fruit in natural juice (without added sugar). Meat and other protein foods Skinless chicken or [place]. Ground chicken or [place]. Pork with fat trimmed off. Fish and seafood. Egg whites. Dried beans, peas, or lentils. Unsalted nuts, nut butters, and seeds. Unsalted canned beans. Lean cuts of beef with fat trimmed off. Low-sodium, lean deli meat. Dairy Low-fat (1%) or fat-free (skim) milk. Fat-free, low-fat, or reduced-fat cheeses. Nonfat,  low-sodium ricotta or cottage cheese. Low-fat or nonfat yogurt. Low-fat, low-sodium cheese. Fats and oils Soft margarine without trans fats. Vegetable oil. Low-fat, reduced-fat, or light mayonnaise and salad dressings (reduced-sodium). Canola, safflower, olive, soybean, and sunflower oils. Avocado. Seasoning and other foods Herbs. Spices. Seasoning mixes without salt. Unsalted popcorn and pretzels. Fat-free sweets. What foods are not recommended? The items listed may not be a complete list. Talk with your dietitian about what dietary choices are best for you. Grains Baked goods made with fat, such as croissants, muffins, or some breads. Dry pasta or rice meal packs. Vegetables Creamed or fried vegetables. Vegetables in a cheese sauce. Regular canned vegetables (not low-sodium or reduced-sodium). Regular canned tomato sauce and paste (not low-sodium or reduced-sodium). Regular tomato and vegetable juice (not low-sodium or reduced-sodium). Angie Fava. Olives. Fruits Canned fruit in a light or heavy syrup. Fried fruit. Fruit in cream or butter sauce. Meat and other protein foods Fatty cuts of meat. Ribs. Fried meat. [place] Salines. Sausage. Bologna and other processed lunch meats. Salami. [place]. Hotdogs. Bratwurst. Salted nuts and seeds. Canned beans with added salt. Canned or smoked fish. Whole eggs or egg  yolks. Chicken or [place] with skin. Dairy Whole or 2% milk, cream, and half-and-half. Whole or full-fat cream cheese. Whole-fat or sweetened yogurt. Full-fat cheese. Nondairy creamers. Whipped toppings. Processed cheese and cheese spreads. Fats and oils Butter. Stick margarine. Lard. Shortening. Ghee. Bacon fat. Tropical oils, such as coconut, palm kernel, or palm oil. Seasoning and other foods Salted popcorn and pretzels. Onion salt, garlic salt, seasoned salt, table salt, and sea salt. Worcestershire sauce. Tartar sauce. Barbecue sauce. Teriyaki sauce. Soy sauce, including reduced-sodium. Steak sauce.  Canned and packaged gravies. Fish sauce. Oyster sauce. Cocktail sauce. Horseradish that you find on the shelf. Ketchup. Mustard. Meat flavorings and tenderizers. Bouillon cubes. Hot sauce and Tabasco sauce. Premade or packaged marinades. Premade or packaged taco seasonings. Relishes. Regular salad dressings. Where to find more information:  National Heart, Lung, and [place]: https://wilson-eaton.com/  American Heart Association: www.heart.org Summary  The DASH eating plan is a healthy eating plan that has been shown to reduce high blood pressure (hypertension). It may also reduce your risk for type 2 diabetes, heart disease, and stroke.  With the DASH eating plan, you should limit salt (sodium) intake to 2,300 mg a day. If you have hypertension, you may need to reduce your sodium intake to 1,500 mg a day.  When on the DASH eating plan, aim to eat more fresh fruits and vegetables, whole grains, lean proteins, low-fat dairy, and heart-healthy fats.  Work with your health care provider or diet and nutrition specialist (dietitian) to adjust your eating plan to your individual calorie needs. This information is not intended to replace advice given to you by your health care provider. Make sure you discuss any questions you have with your health care provider. Document Released: 10/28/2011 Document Revised: 11/01/2016 Document Reviewed: 11/01/2016 Elsevier Interactive Patient Education  2018 [place].  Heart Failure Eating Plan Heart failure, also called congestive heart failure, occurs when your heart does not pump blood well enough to meet your body's needs for oxygen-rich blood. Heart failure is a long-term (chronic) condition. Living with heart failure can be challenging. However, following your health care provider's instructions about a healthy lifestyle and working with a diet and nutrition specialist (dietitian) to choose the right foods may help to improve your symptoms. What are tips  for following this plan? General guidelines  Do not eat more than 2,300 mg of salt (sodium) a day. The amount of sodium that is recommended for you may be lower, depending on your condition.  Maintain a healthy body weight as directed. Ask your health care provider what a healthy weight is for you. ? Check your weight every day. ? Work with your health care provider and dietitian to make a plan that is right for you to lose weight or maintain your current weight.  Limit how much fluid you drink. Ask your health care provider or dietitian how much fluid you can have each day.  Limit or avoid alcohol as told by your health care provider or dietitian. Reading food labels  Check food labels for the amount of sodium per serving. Choose foods that have less than 140 mg (milligrams) of sodium in each serving.  Check food labels for the number of calories per serving. This is important if you need to limit your daily calorie intake to lose weight.  Check food labels for the serving size. If you eat more than one serving, you will be eating more sodium and calories than what is listed on the label.  Look  for foods that are labeled as "sodium-free," "very low sodium," or "low sodium." ? Foods labeled as "reduced sodium" or "lightly salted" may still have more sodium than what is recommended for you. Cooking  Avoid adding salt when cooking. Ask your health care provider or dietitian before using salt substitutes.  Season food with salt-free seasonings, spices, or herbs. Check the label of seasoning mixes to make sure they do not contain salt.  Cook with heart-healthy oils, such as olive, canola, soybean, or sunflower oil.  Do not fry foods. Cook foods using low-fat methods, such as baking, boiling, grilling, and broiling.  Limit unhealthy fats when cooking by: ? Removing the skin from poultry, such as chicken. ? Removing all visible fats from meats. ? Skimming the fat off from stews, soups,  and gravies before serving them. Meal planning  Limit your intake of: ? Processed, canned, or pre-packaged foods. ? Foods that are high in trans fat, such as fried foods. ? Sweets, desserts, sugary drinks, and other foods with added sugar. ? Full-fat dairy products, such as whole milk.  Eat a balanced diet that includes: ? 4-5 servings of fruit each day and 4-5 servings of vegetables each day. At each meal, try to fill half of your plate with fruits and vegetables. ? Up to 6-8 servings of whole grains each day. ? Up to 2 servings of lean meat, poultry, or fish each day. One serving of meat is equal to 3 oz. This is about the same size as a deck of cards. ? 2 servings of low-fat dairy each day. ? Heart-healthy fats. Healthy fats called omega-3 fatty acids are found in foods such as flaxseed and cold-water fish like sardines, salmon, and mackerel.  Aim to eat 25-35 g (grams) of fiber a day. Foods that are high in fiber include apples, broccoli, carrots, beans, peas, and whole grains.  Do not add salt or condiments that contain salt (such as soy sauce) to foods before eating.  When eating at a restaurant, ask that your food be prepared with less salt or no salt, if possible.  Try to eat 2 or more vegetarian meals each week.  Eat more home-cooked food and eat less restaurant, buffet, and fast food. Recommended foods The items listed may not be a complete list. Talk with your dietitian about what dietary choices are best for you. Grains Bread with less than 80 mg of sodium per slice. Whole-wheat pasta, quinoa, and brown rice. Oats and oatmeal. Barley. [place]. Grits and cream of wheat. Whole-grain and whole-wheat cold cereal. Vegetables All fresh vegetables. Vegetables that are frozen without sauce or added salt. Low-sodium or sodium-free canned vegetables. Fruits All fresh, frozen, and canned fruits. Dried fruits, such as raisins, prunes, and cranberries. Meats and other protein  foods Lean cuts of meat. Skinless chicken and [place]. Fish with high omega-3 fatty acids, such as salmon, sardines, and other cold-water fishes. Eggs. Dried beans, peas, and edamame. Unsalted nuts and nut butters. Dairy Low-fat or nonfat (skim) milk and dried milk. Rice milk, soy milk, and almond milk. Low-fat or nonfat yogurt. Small amounts of reduced-sodium block cheese. Low-sodium cottage cheese. Fats and oils Olive, canola, soybean, flaxseed, or sunflower oil. Avocado. Sweets and desserts Apple sauce. Granola bars. Sugar-free pudding and gelatin. Frozen fruit bars. Seasoning and other foods Fresh and dried herbs. Lemon or lime juice. Vinegar. Low-sodium ketchup. Salt-free marinades, salad dressings, sauces, and seasonings. Foods to avoid The items listed may not be a complete list. Talk with  your dietitian about what dietary choices are best for you. Grains Bread with more than 80 mg of sodium per slice. Hot or cold cereal with more than 140 mg sodium per serving. Salted pretzels and crackers. Pre-packaged breadcrumbs. Bagels, croissants, and biscuits. Vegetables Canned vegetables. Frozen vegetables with sauce or seasonings. Creamed vegetables. [place] fries. Onion rings. Pickled vegetables and sauerkraut. Fruits Fruits that are dried with sodium-containing preservatives. Meats and other protein foods Ribs and chicken wings. Bacon, ham, pepperoni, bologna, salami, and packaged luncheon meats. Hot dogs, bratwurst, and sausage. Canned meat. Smoked meat and fish. Salted nuts and seeds. Dairy Whole milk, half-and-half, and cream. Buttermilk. Processed cheese, cheese spreads, and cheese curds. Regular cottage cheese. Feta cheese. Shredded cheese. String cheese. Fats and oils Butter, lard, shortening, ghee, and bacon fat. Canned and packaged gravies. Seasoning and other foods Onion salt, garlic salt, table salt, and sea salt. Marinades. Regular salad dressings. Relishes, pickles, and olives.  Meat flavorings and tenderizers, and bouillon cubes. Horseradish, ketchup, and mustard. Worcestershire sauce. Teriyaki sauce, soy sauce (including reduced sodium). Hot sauce and Tabasco sauce. Steak sauce, fish sauce, oyster sauce, and cocktail sauce. Taco seasonings. Barbecue sauce. Tartar sauce. Summary  A heart failure eating plan includes changes that limit your intake of sodium and unhealthy fat, and it may help you lose weight or maintain a healthy weight. Your health care provider may also recommend limiting how much fluid you drink.  Most people with heart failure should eat no more than 2,300 mg of salt (sodium) a day. The amount of sodium that is recommended for you may be lower, depending on your condition.  Contact your health care provider or dietitian before making any major changes to your diet. This information is not intended to replace advice given to you by your health care provider. Make sure you discuss any questions you have with your health care provider. Document Released: 03/25/2017 Document Revised: 03/25/2017 Document Reviewed: 03/25/2017 Elsevier Interactive Patient Education  2018 [employer].

## 2018-08-30 ENCOUNTER — Other Ambulatory Visit: Payer: Self-pay

## 2018-09-01 ENCOUNTER — Other Ambulatory Visit: Payer: Self-pay | Admitting: Endocrinology

## 2018-09-01 MED ORDER — GABAPENTIN 300 MG PO CAPS: 300.0000 mg | ORAL_CAPSULE | Freq: Three times a day (TID) | ORAL | 2 refills | Status: DC

## 2018-09-01 NOTE — Telephone Encounter (Signed)
Rx Gabapentin 300mg  disp--270, R-2 sent to [place]
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
## 2018-09-05 ENCOUNTER — Telehealth: Payer: Self-pay | Admitting: Physician Assistant

## 2018-09-05 ENCOUNTER — Ambulatory Visit (INDEPENDENT_AMBULATORY_CARE_PROVIDER_SITE_OTHER): Payer: BLUE CROSS/BLUE SHIELD | Admitting: Physician Assistant

## 2018-09-05 VITALS — BP 126/72 | Ht 69.0 in | Wt 287.0 lb

## 2018-09-05 DIAGNOSIS — Z79899 Other long term (current) drug therapy: Secondary | ICD-10-CM

## 2018-09-05 DIAGNOSIS — I428 Other cardiomyopathies: Secondary | ICD-10-CM

## 2018-09-05 DIAGNOSIS — I4891 Unspecified atrial fibrillation: Secondary | ICD-10-CM | POA: Diagnosis not present

## 2018-09-05 DIAGNOSIS — Z950 Presence of cardiac pacemaker: Secondary | ICD-10-CM | POA: Diagnosis not present

## 2018-09-05 DIAGNOSIS — H43811 Vitreous degeneration, right eye: Secondary | ICD-10-CM | POA: Diagnosis not present

## 2018-09-05 DIAGNOSIS — I480 Paroxysmal atrial fibrillation: Secondary | ICD-10-CM | POA: Diagnosis not present

## 2018-09-05 NOTE — Patient Instructions (Signed)
Medication Instructions:  NONE ORDERED  TODAY  If you need a refill on your cardiac medications before your next appointment, please call your pharmacy.   Lab work: NONE ORDERED  TODAY  If you have labs (blood work) drawn today and your tests are completely normal, you will receive your results only by: [name] MyChart Message (if you have MyChart) OR . A paper copy in the mail If you have any lab test that is abnormal or we need to change your treatment, we will call you to review the results.  Testing/Procedures:  NONE ORDERED  TODAY   Follow-Up:  IN 6 WEEKS WITH DR KELLY OR HIS APP FOR FOLLOW UP ON ENTRESTO   At [facility], you and your health needs are our priority.  As part of our continuing mission to provide you with exceptional heart care, we have created designated Provider Care Teams.  These Care Teams include your primary Cardiologist (physician) and Advanced Practice Providers (APPs -  Physician Assistants and Nurse Practitioners) who all work together to provide you with the care you need, when you need it. You will need a follow up appointment in 1 years.  Please call our office 2 months in advance to schedule this appointment.  You may see Dr. Babs Sciara one of the following Advanced Practice Providers on your designated Care Team:   Chanetta Marshall, NP . Tommye Standard, PA-C   6 month with device clinic for pacer check   Any Other Special Instructions Will Be Listed Below (If Applicable).

## 2018-09-06 ENCOUNTER — Encounter: Payer: Self-pay | Admitting: Cardiovascular Disease

## 2018-09-06 LAB — MAGNESIUM: MAGNESIUM: 2.2 mg/dL (ref 1.6–2.3)

## 2018-09-06 NOTE — Telephone Encounter (Signed)
Opened in error

## 2018-09-08 LAB — CUP PACEART INCLINIC DEVICE CHECK
Battery Impedance: 10000 Ohm
Battery Remaining Longevity: 12 mo
Brady Statistic RA Percent Paced: 98 %
Implantable Lead Implant Date: 20091007
Implantable Lead Location: 753859
Implantable Lead Location: 753860
Implantable Pulse Generator Implant Date: 20091007
Lead Channel Pacing Threshold Amplitude: 1 V
Lead Channel Pacing Threshold Pulse Width: 0.4 ms
Lead Channel Sensing Intrinsic Amplitude: 1.1 mV
Lead Channel Sensing Intrinsic Amplitude: 12 mV
Lead Channel Setting Pacing Amplitude: 2 V
Lead Channel Setting Pacing Amplitude: 2.5 V
Lead Channel Setting Pacing Pulse Width: 0.4 ms
MDC IDC LEAD IMPLANT DT: 20091007
MDC IDC MSMT BATTERY VOLTAGE: 2.68 V
MDC IDC MSMT LEADCHNL RA IMPEDANCE VALUE: 373 Ohm
MDC IDC MSMT LEADCHNL RA PACING THRESHOLD PULSEWIDTH: 0.4 ms
MDC IDC MSMT LEADCHNL RV IMPEDANCE VALUE: 542 Ohm
MDC IDC MSMT LEADCHNL RV PACING THRESHOLD AMPLITUDE: 0.75 V
MDC IDC SESS DTM: 20191015171655
MDC IDC SET LEADCHNL RV SENSING SENSITIVITY: 2 mV
MDC IDC STAT BRADY RV PERCENT PACED: 1 % — AB
Pulse Gen Serial Number: 1266370

## 2018-09-11 ENCOUNTER — Encounter (HOSPITAL_COMMUNITY): Payer: Self-pay

## 2018-09-11 DIAGNOSIS — G4733 Obstructive sleep apnea (adult) (pediatric): Secondary | ICD-10-CM | POA: Diagnosis not present

## 2018-09-11 NOTE — Pre-Procedure Instructions (Signed)
The following are in epic: Last office visit note [name]. 09/05/2018 EKG 06/26/2018 PPM check 09/05/2018 ECHO 04/01/2018

## 2018-09-11 NOTE — Patient Instructions (Addendum)
Your procedure is scheduled on: Wednesday, Oct. 23, 2019   Surgery Time: 10:30AM-11:30AM   Report to [facility] Main  Entrance    Report to admitting at 8:30 AM   Call this number if you have problems the morning of surgery [phone]   Bring CPAP mask and tubing day of surgery   Do not eat food or drink liquids :After Midnight.   Brush your teeth the morning of surgery.   Do NOT smoke after Midnight   Take these medicines the morning of surgery with A SIP OF WATER: Alfuzosin, Gabapentin, Sotalol   Take normal Novolog dose for meal times    [place] take 1/2 (half) morning dose day of surgery  DO NOT TAKE ANY DIABETIC MEDICATIONS DAY OF YOUR SURGERY  Bring Asthma Inhaler day of surgery                               You may not have any metal on your body including  jewelry, and body piercings             Do not wear lotions, powders, perfumes/cologne, or deodorant                        Men may shave face and neck.   Do not bring valuables to the hospital. [place].   Contacts, dentures or bridgework may not be worn into surgery.    Patients discharged the day of surgery will not be allowed to drive home.   Special Instructions: Bring a copy of your healthcare power of attorney and living will documents         the day of surgery if you haven't scanned them in before.              Please read over the following fact sheets you were given:  [facility] - Preparing for Surgery Before surgery, you can play an important role.  Because skin is not sterile, your skin needs to be as free of germs as possible.  You can reduce the number of germs on your skin by washing with CHG (chlorahexidine gluconate) soap before surgery.  CHG is an antiseptic cleaner which kills germs and bonds with the skin to continue killing germs even after washing. Please DO NOT use if you have an allergy to CHG or antibacterial soaps.   If your skin becomes reddened/irritated stop using the CHG and inform your nurse when you arrive at Short Stay. Do not shave (including legs and underarms) for at least 48 hours prior to the first CHG shower.  You may shave your face/neck.  Please follow these instructions carefully:  1.  Shower with CHG Soap the night before surgery and the  morning of surgery.  2.  If you choose to wash your hair, wash your hair first as usual with your normal  shampoo.  3.  After you shampoo, rinse your hair and body thoroughly to remove the shampoo.                             4.  Use CHG as you would any other liquid soap.  You can apply chg directly to the skin and wash.  Gently with a scrungie  or clean washcloth.  5.  Apply the CHG Soap to your body ONLY FROM THE NECK DOWN.   Do   not use on face/ open                           Wound or open sores. Avoid contact with eyes, ears mouth and   genitals (private parts).                       Wash face,  Genitals (private parts) with your normal soap.             6.  Wash thoroughly, paying special attention to the area where your    surgery  will be performed.  7.  Thoroughly rinse your body with warm water from the neck down.  8.  DO NOT shower/wash with your normal soap after using and rinsing off the CHG Soap.                9.  Pat yourself dry with a clean towel.            10.  Wear clean pajamas.            11.  Place clean sheets on your bed the night of your first shower and do not  sleep with pets. Day of Surgery : Do not apply any lotions/deodorants the morning of surgery.  Please wear clean clothes to the hospital/surgery center.  FAILURE TO FOLLOW THESE INSTRUCTIONS MAY RESULT IN THE CANCELLATION OF YOUR SURGERY  PATIENT SIGNATURE_________________________________  NURSE SIGNATURE__________________________________  ________________________________________________________________________   Adam Phenix  An incentive spirometer is a  tool that can help keep your lungs clear and active. This tool measures how well you are filling your lungs with each breath. Taking long deep breaths may help reverse or decrease the chance of developing breathing (pulmonary) problems (especially infection) following:  A long period of time when you are unable to move or be active. BEFORE THE PROCEDURE   If the spirometer includes an indicator to show your best effort, your nurse or respiratory therapist will set it to a desired goal.  If possible, sit up straight or lean slightly forward. Try not to slouch.  Hold the incentive spirometer in an upright position. INSTRUCTIONS FOR USE  1. Sit on the edge of your bed if possible, or sit up as far as you can in bed or on a chair. 2. Hold the incentive spirometer in an upright position. 3. Breathe out normally. 4. Place the mouthpiece in your mouth and seal your lips tightly around it. 5. Breathe in slowly and as deeply as possible, raising the piston or the ball toward the top of the column. 6. Hold your breath for 3-5 seconds or for as long as possible. Allow the piston or ball to fall to the bottom of the column. 7. Remove the mouthpiece from your mouth and breathe out normally. 8. Rest for a few seconds and repeat Steps 1 through 7 at least 10 times every 1-2 hours when you are awake. Take your time and take a few normal breaths between deep breaths. 9. The spirometer may include an indicator to show your best effort. Use the indicator as a goal to work toward during each repetition. 10. After each set of 10 deep breaths, practice coughing to be sure your lungs are clear. If you have an incision (the cut made at the  time of surgery), support your incision when coughing by placing a pillow or rolled up towels firmly against it. Once you are able to get out of bed, walk around indoors and cough well. You may stop using the incentive spirometer when instructed by your caregiver.  RISKS AND  COMPLICATIONS  Take your time so you do not get dizzy or light-headed.  If you are in pain, you may need to take or ask for pain medication before doing incentive spirometry. It is harder to take a deep breath if you are having pain. AFTER USE  Rest and breathe slowly and easily.  It can be helpful to keep track of a log of your progress. Your caregiver can provide you with a simple table to help with this. If you are using the spirometer at home, follow these instructions: [place] IF:   You are having difficultly using the spirometer.  You have trouble using the spirometer as often as instructed.  Your pain medication is not giving enough relief while using the spirometer.  You develop fever of 100.5 F (38.1 C) or higher. SEEK IMMEDIATE MEDICAL CARE IF:   You cough up bloody sputum that had not been present before.  You develop fever of 102 F (38.9 C) or greater.  You develop worsening pain at or near the incision site. MAKE SURE YOU:   Understand these instructions.  Will watch your condition.  Will get help right away if you are not doing well or get worse. Document Released: 03/21/2007 Document Revised: 01/31/2012 Document Reviewed: 05/22/2007 [facility] Patient Information 2014 [place], [place].   ________________________________________________________________________

## 2018-09-11 NOTE — Pre-Procedure Instructions (Signed)
Spoke with Dr. Jillyn Hidden Mr. Didonato will not need another cardiac clearance for the right carpal tunnel release procedure scheduled for 09/13/2018.  See cardiac clearance from 07/19/2018 telephone encounter Rosaria Ferries, PA in epic.

## 2018-09-12 ENCOUNTER — Encounter (HOSPITAL_COMMUNITY)
Admission: RE | Admit: 2018-09-12 | Discharge: 2018-09-12 | Disposition: A | Discharge status: Home / Self Care | Payer: BLUE CROSS/BLUE SHIELD | Source: Ambulatory Visit | Attending: Orthopedic Surgery | Admitting: Orthopedic Surgery

## 2018-09-12 ENCOUNTER — Encounter (HOSPITAL_COMMUNITY): Payer: Self-pay

## 2018-09-12 ENCOUNTER — Other Ambulatory Visit: Payer: Self-pay

## 2018-09-12 DIAGNOSIS — Z01812 Encounter for preprocedural laboratory examination: Secondary | ICD-10-CM | POA: Diagnosis not present

## 2018-09-12 HISTORY — DX: Left bundle-branch block, unspecified: I44.7

## 2018-09-12 HISTORY — DX: Carpal tunnel syndrome, unspecified upper limb: G56.00

## 2018-09-12 HISTORY — DX: Chronic kidney disease, stage 3 (moderate): N18.3

## 2018-09-12 HISTORY — DX: Headache, unspecified: R51.9

## 2018-09-12 HISTORY — DX: Ischemic cardiomyopathy: I25.5

## 2018-09-12 HISTORY — DX: Sick sinus syndrome: I49.5

## 2018-09-12 HISTORY — DX: Iron deficiency anemia, unspecified: D50.9

## 2018-09-12 HISTORY — DX: Pneumonia, unspecified organism: J18.9

## 2018-09-12 HISTORY — DX: Heart failure, unspecified: I50.9

## 2018-09-12 HISTORY — DX: Chronic kidney disease, stage 3 unspecified: N18.30

## 2018-09-12 HISTORY — DX: Headache: R51

## 2018-09-12 HISTORY — DX: Unspecified hearing loss, unspecified ear: H91.90

## 2018-09-12 LAB — BASIC METABOLIC PANEL
Anion gap: 7 (ref 5–15)
BUN: 27 mg/dL — AB (ref 8–23)
CHLORIDE: 110 mmol/L (ref 98–111)
CO2: 24 mmol/L (ref 22–32)
Calcium: 8.7 mg/dL — ABNORMAL LOW (ref 8.9–10.3)
Creatinine, Ser: 1.16 mg/dL (ref 0.61–1.24)
GFR calc Af Amer: >60 mL/min (ref >60)
GFR calc non Af Amer: >60 mL/min (ref >60)
GLUCOSE: 150 mg/dL — AB (ref 70–99)
POTASSIUM: 4.7 mmol/L (ref 3.5–5.1)
Sodium: 141 mmol/L (ref 135–145)

## 2018-09-12 LAB — GLUCOSE, CAPILLARY: GLUCOSE-CAPILLARY: 132 mg/dL — AB (ref 70–99)

## 2018-09-12 LAB — CBC
HEMATOCRIT: 44.4 % (ref 39.0–52.0)
Hemoglobin: 13.6 g/dL (ref 13.0–17.0)
MCH: 28.4 pg (ref 26.0–34.0)
MCHC: 30.6 g/dL (ref 30.0–36.0)
MCV: 92.7 fL (ref 80.0–100.0)
PLATELETS: 175 10*3/uL (ref 150–400)
RBC: 4.79 MIL/uL (ref 4.22–5.81)
RDW: 15.2 % (ref 11.5–15.5)
WBC: 6.9 10*3/uL (ref 4.0–10.5)
nRBC: 0 % (ref 0.0–0.2)

## 2018-09-12 MED ORDER — DEXTROSE 5 % IV SOLN
3.0000 g | INTRAVENOUS | Status: AC
  Administered 2018-09-13: 3 g via INTRAVENOUS
  Filled 2018-09-12: qty 3

## 2018-09-13 ENCOUNTER — Other Ambulatory Visit: Payer: Self-pay

## 2018-09-13 ENCOUNTER — Encounter (HOSPITAL_COMMUNITY)
Admission: RE | Disposition: A | Discharge status: Home / Self Care | Payer: Self-pay | Source: Ambulatory Visit | Attending: Orthopedic Surgery

## 2018-09-13 ENCOUNTER — Encounter (HOSPITAL_COMMUNITY): Payer: Self-pay | Admitting: *Deleted

## 2018-09-13 ENCOUNTER — Ambulatory Visit (HOSPITAL_COMMUNITY): Payer: BLUE CROSS/BLUE SHIELD | Admitting: Anesthesiology

## 2018-09-13 ENCOUNTER — Ambulatory Visit (HOSPITAL_COMMUNITY)
Admission: RE | Admit: 2018-09-13 | Discharge: 2018-09-13 | Disposition: A | Discharge status: Home / Self Care | Payer: BLUE CROSS/BLUE SHIELD | Source: Ambulatory Visit | Attending: Orthopedic Surgery | Admitting: Orthopedic Surgery

## 2018-09-13 DIAGNOSIS — G4733 Obstructive sleep apnea (adult) (pediatric): Secondary | ICD-10-CM | POA: Insufficient documentation

## 2018-09-13 DIAGNOSIS — I130 Hypertensive heart and chronic kidney disease with heart failure and stage 1 through stage 4 chronic kidney disease, or unspecified chronic kidney disease: Secondary | ICD-10-CM | POA: Insufficient documentation

## 2018-09-13 DIAGNOSIS — J45909 Unspecified asthma, uncomplicated: Secondary | ICD-10-CM | POA: Insufficient documentation

## 2018-09-13 DIAGNOSIS — Z7901 Long term (current) use of anticoagulants: Secondary | ICD-10-CM | POA: Diagnosis not present

## 2018-09-13 DIAGNOSIS — I495 Sick sinus syndrome: Secondary | ICD-10-CM | POA: Diagnosis not present

## 2018-09-13 DIAGNOSIS — Z87891 Personal history of nicotine dependence: Secondary | ICD-10-CM | POA: Diagnosis not present

## 2018-09-13 DIAGNOSIS — Z955 Presence of coronary angioplasty implant and graft: Secondary | ICD-10-CM | POA: Insufficient documentation

## 2018-09-13 DIAGNOSIS — E1122 Type 2 diabetes mellitus with diabetic chronic kidney disease: Secondary | ICD-10-CM | POA: Insufficient documentation

## 2018-09-13 DIAGNOSIS — I252 Old myocardial infarction: Secondary | ICD-10-CM | POA: Insufficient documentation

## 2018-09-13 DIAGNOSIS — E119 Type 2 diabetes mellitus without complications: Secondary | ICD-10-CM

## 2018-09-13 DIAGNOSIS — Z950 Presence of cardiac pacemaker: Secondary | ICD-10-CM | POA: Diagnosis not present

## 2018-09-13 DIAGNOSIS — E785 Hyperlipidemia, unspecified: Secondary | ICD-10-CM | POA: Insufficient documentation

## 2018-09-13 DIAGNOSIS — I480 Paroxysmal atrial fibrillation: Secondary | ICD-10-CM | POA: Diagnosis not present

## 2018-09-13 DIAGNOSIS — I2510 Atherosclerotic heart disease of native coronary artery without angina pectoris: Secondary | ICD-10-CM | POA: Insufficient documentation

## 2018-09-13 DIAGNOSIS — Z79899 Other long term (current) drug therapy: Secondary | ICD-10-CM | POA: Diagnosis not present

## 2018-09-13 DIAGNOSIS — I5043 Acute on chronic combined systolic (congestive) and diastolic (congestive) heart failure: Secondary | ICD-10-CM | POA: Diagnosis not present

## 2018-09-13 DIAGNOSIS — Z7982 Long term (current) use of aspirin: Secondary | ICD-10-CM | POA: Insufficient documentation

## 2018-09-13 DIAGNOSIS — Z951 Presence of aortocoronary bypass graft: Secondary | ICD-10-CM | POA: Diagnosis not present

## 2018-09-13 DIAGNOSIS — Z794 Long term (current) use of insulin: Secondary | ICD-10-CM | POA: Insufficient documentation

## 2018-09-13 DIAGNOSIS — I509 Heart failure, unspecified: Secondary | ICD-10-CM | POA: Insufficient documentation

## 2018-09-13 DIAGNOSIS — I872 Venous insufficiency (chronic) (peripheral): Secondary | ICD-10-CM | POA: Diagnosis not present

## 2018-09-13 DIAGNOSIS — N183 Chronic kidney disease, stage 3 (moderate): Secondary | ICD-10-CM | POA: Insufficient documentation

## 2018-09-13 DIAGNOSIS — G5601 Carpal tunnel syndrome, right upper limb: Secondary | ICD-10-CM | POA: Insufficient documentation

## 2018-09-13 HISTORY — PX: CARPAL TUNNEL RELEASE: SHX101

## 2018-09-13 LAB — GLUCOSE, CAPILLARY
GLUCOSE-CAPILLARY: 73 mg/dL (ref 70–99)
GLUCOSE-CAPILLARY: 65 mg/dL — AB (ref 70–99)
Glucose-Capillary: 82 mg/dL (ref 70–99)

## 2018-09-13 LAB — PROTIME-INR
INR: 0.97
Prothrombin Time: 12.7 seconds (ref 11.4–15.2)

## 2018-09-13 LAB — HEMOGLOBIN A1C
Hgb A1c MFr Bld: 6.6 % — ABNORMAL HIGH (ref 4.8–5.6)
Mean Plasma Glucose: 143 mg/dL

## 2018-09-13 SURGERY — CARPAL TUNNEL RELEASE
Anesthesia: Regional | Laterality: Right

## 2018-09-13 MED ORDER — SODIUM CHLORIDE 0.9 % IV SOLN
INTRAVENOUS | Status: DC | PRN
  Administered 2018-09-13: 500 mL

## 2018-09-13 MED ORDER — PROPOFOL 10 MG/ML IV BOLUS
INTRAVENOUS | Status: AC
  Filled 2018-09-13: qty 20

## 2018-09-13 MED ORDER — FENTANYL CITRATE (PF) 100 MCG/2ML IJ SOLN: 25.0000 ug | INTRAMUSCULAR | Status: DC | PRN

## 2018-09-13 MED ORDER — DEXAMETHASONE SODIUM PHOSPHATE 10 MG/ML IJ SOLN
INTRAMUSCULAR | Status: DC | PRN
  Administered 2018-09-13: 10 mg via INTRAVENOUS

## 2018-09-13 MED ORDER — 0.9 % SODIUM CHLORIDE (POUR BTL) OPTIME
TOPICAL | Status: DC | PRN
  Administered 2018-09-13: 1000 mL

## 2018-09-13 MED ORDER — OXYCODONE-ACETAMINOPHEN 5-325 MG PO TABS: 1.0000 | ORAL_TABLET | ORAL | 0 refills | Status: DC | PRN

## 2018-09-13 MED ORDER — ONDANSETRON HCL 4 MG/2ML IJ SOLN
INTRAMUSCULAR | Status: AC
  Filled 2018-09-13: qty 2

## 2018-09-13 MED ORDER — ACETAMINOPHEN 10 MG/ML IV SOLN
1000.0000 mg | Freq: Four times a day (QID) | INTRAVENOUS | Status: DC
  Administered 2018-09-13: 1000 mg via INTRAVENOUS
  Filled 2018-09-13: qty 100

## 2018-09-13 MED ORDER — LIDOCAINE 2% (20 MG/ML) 5 ML SYRINGE
INTRAMUSCULAR | Status: AC
  Filled 2018-09-13: qty 5

## 2018-09-13 MED ORDER — DEXAMETHASONE SODIUM PHOSPHATE 10 MG/ML IJ SOLN
INTRAMUSCULAR | Status: AC
  Filled 2018-09-13: qty 1

## 2018-09-13 MED ORDER — FENTANYL CITRATE (PF) 100 MCG/2ML IJ SOLN
INTRAMUSCULAR | Status: AC
  Filled 2018-09-13: qty 2

## 2018-09-13 MED ORDER — FENTANYL CITRATE (PF) 100 MCG/2ML IJ SOLN
INTRAMUSCULAR | Status: DC | PRN
  Administered 2018-09-13: 25 ug via INTRAVENOUS

## 2018-09-13 MED ORDER — MIDAZOLAM HCL 2 MG/2ML IJ SOLN
INTRAMUSCULAR | Status: AC
  Filled 2018-09-13: qty 2

## 2018-09-13 MED ORDER — LIDOCAINE IN DEXTROSE 5-7.5 % INTRASPINAL SOLN: INTRASPINAL | Status: DC | PRN

## 2018-09-13 MED ORDER — ONDANSETRON HCL 4 MG/2ML IJ SOLN
INTRAMUSCULAR | Status: DC | PRN
  Administered 2018-09-13: 4 mg via INTRAVENOUS

## 2018-09-13 MED ORDER — LACTATED RINGERS IV SOLN
INTRAVENOUS | Status: DC
  Administered 2018-09-13: 09:00:00 via INTRAVENOUS

## 2018-09-13 MED ORDER — SODIUM CHLORIDE 0.9 % IV SOLN
INTRAVENOUS | Status: AC
  Filled 2018-09-13: qty 500000

## 2018-09-13 MED ORDER — DEXTROSE 50 % IV SOLN
0.5000 | Freq: Once | INTRAVENOUS | Status: AC
  Administered 2018-09-13: 25 mL via INTRAVENOUS

## 2018-09-13 MED ORDER — DEXTROSE 50 % IV SOLN
INTRAVENOUS | Status: AC
  Administered 2018-09-13: 25 mL via INTRAVENOUS
  Filled 2018-09-13: qty 50

## 2018-09-13 MED ORDER — LIDOCAINE HCL (PF) 0.5 % IJ SOLN
INTRAMUSCULAR | Status: DC | PRN
  Administered 2018-09-13: 30 mL via INTRAVENOUS

## 2018-09-13 MED ORDER — BACITRACIN-NEOMYCIN-POLYMYXIN 400-5-5000 EX OINT
TOPICAL_OINTMENT | CUTANEOUS | Status: DC | PRN
  Administered 2018-09-13: 1 via TOPICAL

## 2018-09-13 MED ORDER — PROPOFOL 10 MG/ML IV BOLUS
INTRAVENOUS | Status: AC
  Filled 2018-09-13: qty 40

## 2018-09-13 MED ORDER — PROPOFOL 500 MG/50ML IV EMUL
INTRAVENOUS | Status: DC | PRN
  Administered 2018-09-13: 75 ug/kg/min via INTRAVENOUS

## 2018-09-13 MED ORDER — BACITRACIN ZINC 500 UNIT/GM EX OINT
TOPICAL_OINTMENT | CUTANEOUS | Status: AC
  Filled 2018-09-13: qty 28.35

## 2018-09-13 MED ORDER — CHLORHEXIDINE GLUCONATE 4 % EX LIQD: 60.0000 mL | Freq: Once | CUTANEOUS | Status: DC

## 2018-09-13 MED ORDER — MIDAZOLAM HCL 5 MG/5ML IJ SOLN
INTRAMUSCULAR | Status: DC | PRN
  Administered 2018-09-13: 2 mg via INTRAVENOUS

## 2018-09-13 SURGICAL SUPPLY — 32 items
BAG ZIPLOCK 12X15 (MISCELLANEOUS) ×2 IMPLANT
BANDAGE ACE 3X5.8 VEL STRL LF (GAUZE/BANDAGES/DRESSINGS) ×2 IMPLANT
BLADE SURG 15 STRL LF DISP TIS (BLADE) ×1 IMPLANT
BLADE SURG 15 STRL SS (BLADE) ×1
BNDG CONFORM 3 STRL LF (GAUZE/BANDAGES/DRESSINGS) ×2 IMPLANT
BNDG GAUZE ELAST 4 BULKY (GAUZE/BANDAGES/DRESSINGS) ×2 IMPLANT
COVER SURGICAL LIGHT HANDLE (MISCELLANEOUS) ×2 IMPLANT
COVER WAND RF STERILE (DRAPES) ×2 IMPLANT
CUFF TOURN SGL QUICK 18 (TOURNIQUET CUFF) ×2 IMPLANT
DECANTER SPIKE VIAL GLASS SM (MISCELLANEOUS) IMPLANT
DRSG EMULSION OIL 3X3 NADH (GAUZE/BANDAGES/DRESSINGS) ×2 IMPLANT
DRSG PAD ABDOMINAL 8X10 ST (GAUZE/BANDAGES/DRESSINGS) ×2 IMPLANT
DURAPREP 26ML APPLICATOR (WOUND CARE) ×2 IMPLANT
ELECT REM PT RETURN 15FT ADLT (MISCELLANEOUS) ×2 IMPLANT
GAUZE 4X4 16PLY RFD (DISPOSABLE) IMPLANT
GAUZE SPONGE 4X4 12PLY STRL (GAUZE/BANDAGES/DRESSINGS) ×2 IMPLANT
GLOVE BIOGEL PI IND STRL 8.5 (GLOVE) ×1 IMPLANT
GLOVE BIOGEL PI INDICATOR 8.5 (GLOVE) ×1
GLOVE ECLIPSE 8.0 STRL XLNG CF (GLOVE) ×4 IMPLANT
GOWN STRL REUS W/TWL LRG LVL3 (GOWN DISPOSABLE) ×2 IMPLANT
GOWN STRL REUS W/TWL XL LVL3 (GOWN DISPOSABLE) ×2 IMPLANT
KIT BASIN OR (CUSTOM PROCEDURE TRAY) ×2 IMPLANT
MANIFOLD NEPTUNE II (INSTRUMENTS) ×2 IMPLANT
NS IRRIG 1000ML POUR BTL (IV SOLUTION) ×2 IMPLANT
PACK ORTHO EXTREMITY (CUSTOM PROCEDURE TRAY) ×2 IMPLANT
PAD CAST 3X4 CTTN HI CHSV (CAST SUPPLIES) ×1 IMPLANT
PADDING CAST COTTON 3X4 STRL (CAST SUPPLIES) ×1
POSITIONER SURGICAL ARM (MISCELLANEOUS) ×2 IMPLANT
SUT ETHILON 3 0 PS 1 (SUTURE) ×4 IMPLANT
SYR CONTROL 10ML LL (SYRINGE) ×2 IMPLANT
TOWEL OR 17X26 10 PK STRL BLUE (TOWEL DISPOSABLE) ×2 IMPLANT
WATER STERILE IRR 1000ML POUR (IV SOLUTION) ×2 IMPLANT

## 2018-09-13 NOTE — Anesthesia Procedure Notes (Addendum)
Date/Time: 09/13/2018 10:33 AM Performed by: Sharlette Dense, CRNA Oxygen Delivery Method: Simple face mask

## 2018-09-13 NOTE — Anesthesia Procedure Notes (Signed)
Anesthesia Regional Block: Bier block (IV Regional)   Pre-Anesthetic Checklist: ,, timeout performed, Correct Patient, Correct Site, Correct Laterality, Correct Procedure, Correct Position, site marked, Risks and benefits discussed,  Surgical consent,  Pre-op evaluation,  At surgeon's request and post-op pain management  Laterality: Right  Prep: chloraprep       Needles:  Injection technique: Single-shot      Needle Gauge: 21     Additional Needles:   Procedures:,,,,, intact distal pulses, Esmarch exsanguination, single tourniquet utilized, #20gu IV placed  Narrative:  Start time: 09/13/2018 10:40 AM End time: 09/13/2018 10:45 AM Injection made incrementally with aspirations every 5 mL.  Performed by: Personally  Anesthesiologist: Effie Berkshire, MD  Additional Notes: Patient tolerated the procedure well. Local anesthetic introduced in an incremental fashion under minimal resistance after negative aspirations. No paresthesias were elicited. After completion of the procedure, no acute issues were identified and patient continued to be monitored by RN.

## 2018-09-13 NOTE — Anesthesia Postprocedure Evaluation (Signed)
Anesthesia Post Note  Patient: Charles [name]  Procedure(s) Performed: CARPAL TUNNEL RELEASE (Right )     Patient location during evaluation: PACU Anesthesia Type: Bier Block Level of consciousness: awake and alert Pain management: pain level controlled Vital Signs Assessment: post-procedure vital signs reviewed and stable Respiratory status: spontaneous breathing, nonlabored ventilation, respiratory function stable and patient connected to nasal cannula oxygen Cardiovascular status: stable and blood pressure returned to baseline Postop Assessment: no apparent nausea or vomiting Anesthetic complications: no    Last Vitals:  Vitals:   09/13/18 1145 09/13/18 1200  BP: 126/79 123/85  Pulse: 69 73  Resp: 11 14  Temp:  (!) 36.4 C  SpO2: 95% 96%    Last Pain:  Vitals:   09/13/18 1200  TempSrc:   PainSc: 0-No pain                 Effie Berkshire

## 2018-09-13 NOTE — Anesthesia Preprocedure Evaluation (Addendum)
Anesthesia Evaluation  Patient identified by MRN, date of birth, ID band Patient awake    Reviewed: Allergy & Precautions, NPO status , Patient's Chart, lab work & pertinent test results  Airway Mallampati: II  TM Distance: >3 FB Neck ROM: Full    Dental  (+) Teeth Intact, Dental Advisory Given   Pulmonary asthma , sleep apnea and Continuous Positive Airway Pressure Ventilation , former smoker,    breath sounds clear to auscultation       Cardiovascular hypertension, Pt. on medications + CAD, + Past MI, + Cardiac Stents, + CABG and +CHF  + dysrhythmias Atrial Fibrillation + pacemaker  Rhythm:Regular Rate:Normal     Neuro/Psych  Headaches,    GI/Hepatic GERD  ,  Endo/Other  diabetes, Type 2, Insulin Dependent, Oral Hypoglycemic Agents  Renal/GU CRFRenal disease     Musculoskeletal   Abdominal   Peds  Hematology   Anesthesia Other Findings - Sick Sinus Syndrome - HLD - LBBB  Reproductive/Obstetrics                            Lab Results  Component Value Date   WBC 6.9 09/12/2018   HGB 13.6 09/12/2018   HCT 44.4 09/12/2018   MCV 92.7 09/12/2018   PLT 175 09/12/2018   Lab Results  Component Value Date   CREATININE 1.16 09/12/2018   BUN 27 (H) 09/12/2018   NA 141 09/12/2018   K 4.7 09/12/2018   CL 110 09/12/2018   CO2 24 09/12/2018   Lab Results  Component Value Date   INR 0.95 07/26/2018   INR 2.40 04/04/2018   INR 2.39 04/03/2018    Echo: - Left ventricle: Septal and apical akinesis inferior wall   hypokinesis. The cavity size was moderately dilated. Wall   thickness was increased in a pattern of moderate LVH. The   estimated ejection fraction was 30%. Left ventricular diastolic   function parameters were normal. - Aortic valve: Valve area (Vmax): 2.2 cm^2. - Mitral valve: Calcified annulus. Mildly thickened leaflets .   There was moderate regurgitation. - Left atrium:  The atrium was moderately dilated. - Atrial septum: No defect or patent foramen ovale was identified  Anesthesia Physical Anesthesia Plan  ASA: IV  Anesthesia Plan: Bier Block and Bier Block-LIDOCAINE ONLY   Post-op Pain Management:    Induction: Intravenous  PONV Risk Score and Plan: 2 and Propofol infusion and Ondansetron  Airway Management Planned: Simple Face Mask  Additional Equipment: None  Intra-op Plan:   Post-operative Plan:   Informed Consent: I have reviewed the patients History and Physical, chart, labs and discussed the procedure including the risks, benefits and alternatives for the proposed anesthesia with the patient or authorized representative who has indicated his/her understanding and acceptance.   Dental advisory given  Plan Discussed with: CRNA  Anesthesia Plan Comments:        Anesthesia Quick Evaluation
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
## 2018-09-13 NOTE — Op Note (Signed)
NAME: Charles [name], Charles [name] MEDICAL RECORD PT:[id number] ACCOUNT [id number] DATE OF BIRTH:Mar 15, 1946 FACILITY: [facility] LOCATION: [facility] PHYSICIAN:Deb Loudin Fransico Setters, MD  OPERATIVE REPORT  DATE OF PROCEDURE:  09/13/2018  SURGEON:  Latanya Maudlin, MD  ASSISTANT:  OR nurse.  PREOPERATIVE DIAGNOSES:  Severe carpal tunnel syndrome on the right.  POSTOPERATIVE DIAGNOSES:  Severe carpal tunnel syndrome on the right.  OPERATION:  Right carpal tunnel release.  DESCRIPTION OF PROCEDURE:  Under a local block, which is referred to as a Bier block, the tourniquet was elevated at 250 mmHg.  Sterile prep and draping was carried out.  Appropriate timeout was carried out.  I also marked the appropriate right arm in  the holding area.  At this time, an incision was made over the palmar aspect of the right wrist.  Bleeders were identified and cauterized with the bipolar.  I then went down and inserted a self-retaining retractor.  I identified the transverse carpal  ligament and protected the underlying median nerve and incised those ligaments all the way down into the palm and proximally.  The nerve now was totally free.  I thoroughly irrigated out the area and closed the skin with 3-0 nylon suture.  Sterile  Neosporin bundle dressing was applied.  One gram of IV Ancef preop.  LN/NUANCE  D:09/13/2018 T:09/13/2018 JOB:[id number]

## 2018-09-13 NOTE — Transfer of Care (Signed)
Immediate Anesthesia Transfer of Care Note  Patient: Charles [name]  Procedure(s) Performed: CARPAL TUNNEL RELEASE (Right )  Patient Location: PACU  Anesthesia Type:MAC and Bier block  Level of Consciousness: awake, sedated and patient cooperative  Airway & Oxygen Therapy: Patient Spontanous Breathing and Patient connected to face mask oxygen  Post-op Assessment: Report given to RN and Post -op Vital signs reviewed and stable  Post vital signs: Reviewed and stable  Last Vitals:  Vitals Value Taken Time  BP 100/62 09/13/2018 11:32 AM  Temp    Pulse 70 09/13/2018 11:35 AM  Resp 15 09/13/2018 11:35 AM  SpO2 100 % 09/13/2018 11:35 AM  Vitals shown include unvalidated device data.  Last Pain:  Vitals:   09/13/18 0859  TempSrc:   PainSc: 0-No pain      Patients Stated Pain Goal: 7 (69/48/54 6270)  Complications: No apparent anesthesia complications

## 2018-09-13 NOTE — Brief Op Note (Signed)
09/13/2018  11:24 AM  PATIENT:  Charles [name]  72 y.o. male  PRE-OPERATIVE DIAGNOSIS:  right carpal tunnel syndrome  POST-OPERATIVE DIAGNOSIS:  right carpal tunnel syndrome  PROCEDURE:  Procedure(s) with comments: CARPAL TUNNEL RELEASE (Right) - 68min  SURGEON:  Surgeon(s) and Role:    Latanya Maudlin, MD - Primary  PHYSICIAN ASSISTANT: OR Nurse  ASSISTANTS:OR Nurse   ANESTHESIA:  Bier Block  EBL: 0cc  BLOOD ADMINISTERED:none  DRAINS: none   LOCAL MEDICATIONS USED:  NONE  SPECIMEN:  No Specimen  DISPOSITION OF SPECIMEN:  N/A  COUNTS:  YES  TOURNIQUET:   Total Tourniquet Time Documented: Upper Arm (Right) - 32 minutes Total: Upper Arm (Right) - 32 minutes   DICTATION: .Other Dictation: Dictation Number [id number]  PLAN OF CARE: Discharge to home after PACU  PATIENT DISPOSITION:  PACU - hemodynamically stable.   Delay start of Pharmacological VTE agent (>24hrs) due to surgical blood loss or risk of bleeding: yes

## 2018-09-13 NOTE — Interval H&P Note (Signed)
History and Physical Interval Note:  09/13/2018 9:44 AM  Charles [name]  has presented today for surgery, with the diagnosis of right carpal tunnel syndrome  The various methods of treatment have been discussed with the patient and family. After consideration of risks, benefits and other options for treatment, the patient has consented to  Procedure(s) with comments: CARPAL TUNNEL RELEASE (Right) - 34min as a surgical intervention .  The patient's history has been reviewed, patient examined, no change in status, stable for surgery.  I have reviewed the patient's chart and labs.  Questions were answered to the patient's satisfaction.     Latanya Maudlin

## 2018-09-13 NOTE — Interval H&P Note (Signed)
History and Physical Interval Note:  09/13/2018 9:44 AM  Charles [name]  has presented today for surgery, with the diagnosis of right carpal tunnel syndrome  The various methods of treatment have been discussed with the patient and family. After consideration of risks, benefits and other options for treatment, the patient has consented to  Procedure(s) with comments: CARPAL TUNNEL RELEASE (Right) - 65min as a surgical intervention .  The patient's history has been reviewed, patient examined, no change in status, stable for surgery.  I have reviewed the patient's chart and labs.  Questions were answered to the patient's satisfaction.     Latanya Maudlin

## 2018-09-14 ENCOUNTER — Encounter (HOSPITAL_COMMUNITY): Payer: Self-pay | Admitting: Orthopedic Surgery

## 2018-09-19 DIAGNOSIS — D631 Anemia in chronic kidney disease: Secondary | ICD-10-CM | POA: Diagnosis not present

## 2018-09-19 DIAGNOSIS — I129 Hypertensive chronic kidney disease with stage 1 through stage 4 chronic kidney disease, or unspecified chronic kidney disease: Secondary | ICD-10-CM | POA: Diagnosis not present

## 2018-09-19 DIAGNOSIS — N2581 Secondary hyperparathyroidism of renal origin: Secondary | ICD-10-CM | POA: Diagnosis not present

## 2018-09-19 DIAGNOSIS — N183 Chronic kidney disease, stage 3 (moderate): Secondary | ICD-10-CM | POA: Diagnosis not present

## 2018-09-25 ENCOUNTER — Other Ambulatory Visit (INDEPENDENT_AMBULATORY_CARE_PROVIDER_SITE_OTHER): Payer: BLUE CROSS/BLUE SHIELD

## 2018-09-25 DIAGNOSIS — Z794 Long term (current) use of insulin: Secondary | ICD-10-CM

## 2018-09-25 DIAGNOSIS — E1165 Type 2 diabetes mellitus with hyperglycemia: Secondary | ICD-10-CM

## 2018-09-25 LAB — BASIC METABOLIC PANEL
BUN: 22 mg/dL (ref 6–23)
CHLORIDE: 107 meq/L (ref 96–112)
CO2: 24 meq/L (ref 19–32)
CREATININE: 1.13 mg/dL (ref 0.40–1.50)
Calcium: 9.1 mg/dL (ref 8.4–10.5)
GFR: 67.84 mL/min (ref >60.00)
Glucose, Bld: 117 mg/dL — ABNORMAL HIGH (ref 70–99)
POTASSIUM: 4.3 meq/L (ref 3.5–5.1)
SODIUM: 138 meq/L (ref 135–145)

## 2018-09-25 LAB — HEMOGLOBIN A1C: HEMOGLOBIN A1C: 6.8 % — AB (ref 4.6–6.5)

## 2018-09-26 ENCOUNTER — Telehealth: Payer: Self-pay | Admitting: Endocrinology

## 2018-09-26 ENCOUNTER — Other Ambulatory Visit: Payer: BLUE CROSS/BLUE SHIELD

## 2018-09-26 NOTE — Telephone Encounter (Signed)
LMTCB to discuss note below 

## 2018-09-26 NOTE — Telephone Encounter (Signed)
He needs to call his insurance to find out what is covered instead

## 2018-09-26 NOTE — Telephone Encounter (Signed)
Please advise 

## 2018-09-26 NOTE — Telephone Encounter (Signed)
Patient can no longer afford [place]. It is costing $400 just on the copay. Patient would like a different RX prescribed. Please Advise.

## 2018-09-28 ENCOUNTER — Ambulatory Visit (INDEPENDENT_AMBULATORY_CARE_PROVIDER_SITE_OTHER): Payer: BLUE CROSS/BLUE SHIELD | Admitting: Endocrinology

## 2018-09-28 ENCOUNTER — Encounter: Payer: Self-pay | Admitting: Endocrinology

## 2018-09-28 ENCOUNTER — Encounter: Payer: BLUE CROSS/BLUE SHIELD | Attending: Endocrinology | Admitting: Dietician

## 2018-09-28 VITALS — BP 122/78 | HR 64 | Ht 69.0 in | Wt 283.0 lb

## 2018-09-28 DIAGNOSIS — Z794 Long term (current) use of insulin: Secondary | ICD-10-CM

## 2018-09-28 DIAGNOSIS — E118 Type 2 diabetes mellitus with unspecified complications: Principal | ICD-10-CM

## 2018-09-28 DIAGNOSIS — E1165 Type 2 diabetes mellitus with hyperglycemia: Secondary | ICD-10-CM

## 2018-09-28 DIAGNOSIS — IMO0002 Reserved for concepts with insufficient information to code with codable children: Secondary | ICD-10-CM

## 2018-09-28 NOTE — Patient Instructions (Addendum)
Check blood sugars on waking up 3 days a week  Also check blood sugars about 2 hours after meals and do this after different meals by rotation  Recommended blood sugar levels on waking up are 90-130 and about 2 hours after meal is 130-160  Please bring your blood sugar monitor to each visit, thank you  Reduce Novolog for lo carb meals
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
## 2018-09-29 NOTE — Progress Notes (Unsigned)
Brief Nutrition Note  Patient is here as a walk in for [employer]. Patient brought the [place] with him and was able to put this on with instruction. Told patient he needed to swipe the sensor with the reader at least every 8 hours.  Reviewed with patient blood sugar goals before and after meals. Discussed what the trending arrows meant, that this is interstitial fluid and is delayed from actual blood glucose and to test when symptoms do not match [place] reading. Discussed symptoms of hypoglycemia as well as treatment. Patient to call for any questions.  Antonieta Iba, RD, LDN, CDE

## 2018-10-12 ENCOUNTER — Telehealth: Payer: Self-pay | Admitting: Endocrinology

## 2018-10-12 ENCOUNTER — Other Ambulatory Visit: Payer: Self-pay

## 2018-10-12 MED ORDER — FREESTYLE LIBRE 14 DAY SENSOR MISC: 1.0000 | 5 refills | Status: DC

## 2018-10-12 NOTE — Telephone Encounter (Signed)
Sent!

## 2018-10-12 NOTE — Telephone Encounter (Signed)
Patient still using this it is not on med list should I refill please advise

## 2018-10-12 NOTE — Telephone Encounter (Signed)
Patient has called in regards to filling sensors for FreeStyle. Pharmacy stated it had been a year since it was filled. Please Advise, thanks [id number] - [place], [place]

## 2018-10-12 NOTE — Telephone Encounter (Signed)
He is starting to use this now and we can refill

## 2018-10-15 DIAGNOSIS — G4733 Obstructive sleep apnea (adult) (pediatric): Secondary | ICD-10-CM | POA: Diagnosis not present
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
## 2018-10-16 ENCOUNTER — Other Ambulatory Visit: Payer: Self-pay | Admitting: Endocrinology

## 2018-10-17 ENCOUNTER — Encounter: Payer: Self-pay | Admitting: Adult Health

## 2018-10-17 ENCOUNTER — Ambulatory Visit (INDEPENDENT_AMBULATORY_CARE_PROVIDER_SITE_OTHER): Payer: BLUE CROSS/BLUE SHIELD | Admitting: Adult Health

## 2018-10-17 VITALS — BP 144/82 | HR 70 | Ht 69.0 in | Wt 288.0 lb

## 2018-10-17 DIAGNOSIS — Z95810 Presence of automatic (implantable) cardiac defibrillator: Secondary | ICD-10-CM

## 2018-10-17 DIAGNOSIS — I2510 Atherosclerotic heart disease of native coronary artery without angina pectoris: Secondary | ICD-10-CM | POA: Diagnosis not present

## 2018-10-17 DIAGNOSIS — I5022 Chronic systolic (congestive) heart failure: Secondary | ICD-10-CM

## 2018-10-17 DIAGNOSIS — I10 Essential (primary) hypertension: Secondary | ICD-10-CM | POA: Diagnosis not present

## 2018-10-17 DIAGNOSIS — I480 Paroxysmal atrial fibrillation: Secondary | ICD-10-CM

## 2018-10-17 NOTE — Patient Instructions (Signed)
Follow-Up: You will need a follow up appointment in [place].   You may see  DR Tiana Loft, DNP, AACC -or- one of the following Advanced Practice Providers on your designated Care Team:  Kerin Ransom, [place]  Medication Instructions:  NO CHANGES- Your physician recommends that you continue on your current medications as directed. Please refer to the Current Medication list given to you today. If you need a refill on your cardiac medications before your next appointment, please call your pharmacy.  Labwork: If you have labs (blood work) drawn today and your tests are completely normal, you will receive your results ONLY by: . MyChart Message (if you have MyChart) -OR- . A paper copy in the mail At [facility], you and your health needs are our priority.  As part of our continuing mission to provide you with exceptional heart care, we have created designated Provider Care Teams.  These Care Teams include your primary Cardiologist (physician) and Advanced Practice Providers (APPs -  Physician Assistants and Nurse Practitioners) who all work together to provide you with the care you need, when you need it.  Thank you for choosing [facility]!!

## 2018-10-28 ENCOUNTER — Other Ambulatory Visit: Payer: Self-pay | Admitting: Endocrinology

## 2018-11-02 DIAGNOSIS — R319 Hematuria, unspecified: Secondary | ICD-10-CM | POA: Diagnosis not present

## 2018-11-02 DIAGNOSIS — Z7901 Long term (current) use of anticoagulants: Secondary | ICD-10-CM | POA: Diagnosis not present

## 2018-11-02 DIAGNOSIS — R350 Frequency of micturition: Secondary | ICD-10-CM | POA: Diagnosis not present

## 2018-11-03 ENCOUNTER — Telehealth: Payer: Self-pay | Admitting: Endocrinology

## 2018-11-03 NOTE — Telephone Encounter (Signed)
Patient requests to be called at ph# [phone]. Patient has question about his [employer] Reader. Please call patient at the ph# listed above to advise.

## 2018-11-06 NOTE — Telephone Encounter (Signed)
Pt stated that he was wondering if the meter would tell him when he needed to change his libre sensor of if he needed to keep track of the days. Pt stated that over the weekend, he found out the answer.

## 2018-11-08 ENCOUNTER — Other Ambulatory Visit: Payer: Self-pay | Admitting: Cardiovascular Disease

## 2018-11-09 DIAGNOSIS — R791 Abnormal coagulation profile: Secondary | ICD-10-CM | POA: Diagnosis not present

## 2018-11-11 DIAGNOSIS — G4733 Obstructive sleep apnea (adult) (pediatric): Secondary | ICD-10-CM | POA: Diagnosis not present

## 2018-11-16 DIAGNOSIS — Z7901 Long term (current) use of anticoagulants: Secondary | ICD-10-CM | POA: Diagnosis not present

## 2018-11-20 ENCOUNTER — Other Ambulatory Visit: Payer: Self-pay | Admitting: Cardiovascular Disease

## 2018-11-24 ENCOUNTER — Other Ambulatory Visit: Payer: Self-pay | Admitting: Endocrinology

## 2018-12-12 DIAGNOSIS — G4733 Obstructive sleep apnea (adult) (pediatric): Secondary | ICD-10-CM | POA: Diagnosis not present

## 2018-12-13 ENCOUNTER — Telehealth: Payer: Self-pay

## 2018-12-13 NOTE — Telephone Encounter (Signed)
PA initiated via [email or web address] for [employer].  PJA:[id number]

## 2018-12-21 DIAGNOSIS — R791 Abnormal coagulation profile: Secondary | ICD-10-CM | POA: Diagnosis not present

## 2018-12-25 ENCOUNTER — Other Ambulatory Visit: Payer: BLUE CROSS/BLUE SHIELD

## 2018-12-25 ENCOUNTER — Other Ambulatory Visit (INDEPENDENT_AMBULATORY_CARE_PROVIDER_SITE_OTHER): Payer: BLUE CROSS/BLUE SHIELD

## 2018-12-25 DIAGNOSIS — Z794 Long term (current) use of insulin: Secondary | ICD-10-CM | POA: Diagnosis not present

## 2018-12-25 DIAGNOSIS — E1165 Type 2 diabetes mellitus with hyperglycemia: Secondary | ICD-10-CM | POA: Diagnosis not present

## 2018-12-25 LAB — COMPREHENSIVE METABOLIC PANEL
ALK PHOS: 53 U/L (ref 39–117)
ALT: 20 U/L (ref 0–53)
AST: 11 U/L (ref 0–37)
Albumin: 4 g/dL (ref 3.5–5.2)
BILIRUBIN TOTAL: 0.4 mg/dL (ref 0.2–1.2)
BUN: 24 mg/dL — ABNORMAL HIGH (ref 6–23)
CALCIUM: 8.8 mg/dL (ref 8.4–10.5)
CO2: 22 meq/L (ref 19–32)
CREATININE: 1.2 mg/dL (ref 0.40–1.50)
Chloride: 105 mEq/L (ref 96–112)
GFR: 59.51 mL/min — AB (ref >60.00)
Glucose, Bld: 189 mg/dL — ABNORMAL HIGH (ref 70–99)
Potassium: 4.7 mEq/L (ref 3.5–5.1)
Sodium: 137 mEq/L (ref 135–145)
TOTAL PROTEIN: 6.9 g/dL (ref 6.0–8.3)

## 2018-12-25 LAB — HEMOGLOBIN A1C: Hgb A1c MFr Bld: 6.6 % — ABNORMAL HIGH (ref 4.6–6.5)

## 2018-12-26 LAB — MICROALBUMIN / CREATININE URINE RATIO
Creatinine,U: 73.3 mg/dL
Microalb Creat Ratio: 14.9 mg/g (ref 0.0–30.0)
Microalb, Ur: 10.9 mg/dL — ABNORMAL HIGH (ref 0.0–1.9)

## 2018-12-27 ENCOUNTER — Other Ambulatory Visit: Payer: Self-pay | Admitting: Cardiovascular Disease
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
## 2018-12-29 ENCOUNTER — Ambulatory Visit: Payer: BLUE CROSS/BLUE SHIELD | Admitting: Endocrinology

## 2018-12-29 ENCOUNTER — Telehealth: Payer: Self-pay | Admitting: Endocrinology

## 2018-12-29 NOTE — Telephone Encounter (Signed)
FYI  Patient had labs done on 12/25/18 and was unable to make appointment on 12/29/18 due to being sick. Dr.Kumar confirmed that appointment on 01/16/19, patient will not have to redo labs.

## 2018-12-29 NOTE — Telephone Encounter (Signed)
Noted  

## 2019-01-03 NOTE — Telephone Encounter (Signed)
PA was initially denied because on PA, it was stated that the patient is on 50 units or more of long acting insulin per day, when in fact, the patient is on 42 units of long acting insulin daily.  Peer to Peer review done with Vinnie Level, a registered pharmacist and PA was approved from the date of original PA, and is approved for 365 days.  PA approval number: [id number]

## 2019-01-04 ENCOUNTER — Ambulatory Visit (INDEPENDENT_AMBULATORY_CARE_PROVIDER_SITE_OTHER): Payer: BLUE CROSS/BLUE SHIELD | Admitting: Cardiovascular Disease

## 2019-01-04 ENCOUNTER — Other Ambulatory Visit: Payer: Self-pay

## 2019-01-04 ENCOUNTER — Encounter: Payer: Self-pay | Admitting: Cardiovascular Disease

## 2019-01-04 VITALS — BP 134/78 | HR 63 | Ht 69.0 in | Wt 287.0 lb

## 2019-01-04 DIAGNOSIS — I10 Essential (primary) hypertension: Secondary | ICD-10-CM

## 2019-01-04 DIAGNOSIS — G4733 Obstructive sleep apnea (adult) (pediatric): Secondary | ICD-10-CM | POA: Diagnosis not present

## 2019-01-04 DIAGNOSIS — Z951 Presence of aortocoronary bypass graft: Secondary | ICD-10-CM | POA: Diagnosis not present

## 2019-01-04 DIAGNOSIS — I255 Ischemic cardiomyopathy: Secondary | ICD-10-CM

## 2019-01-04 DIAGNOSIS — Z9989 Dependence on other enabling machines and devices: Secondary | ICD-10-CM

## 2019-01-04 DIAGNOSIS — E1159 Type 2 diabetes mellitus with other circulatory complications: Secondary | ICD-10-CM

## 2019-01-04 DIAGNOSIS — Z794 Long term (current) use of insulin: Secondary | ICD-10-CM

## 2019-01-04 DIAGNOSIS — I5042 Chronic combined systolic (congestive) and diastolic (congestive) heart failure: Secondary | ICD-10-CM

## 2019-01-04 MED ORDER — SACUBITRIL-VALSARTAN 24-26 MG PO TABS: 1.0000 | ORAL_TABLET | Freq: Two times a day (BID) | ORAL | 0 refills | Status: DC

## 2019-01-04 NOTE — Patient Instructions (Signed)
Medication Instructions:  Take 1 tablet of Entresto 24/26 twice daily.  If you need a refill on your cardiac medications before your next appointment, please call your pharmacy.   Follow-Up: At [facility], you and your health needs are our priority.  As part of our continuing mission to provide you with exceptional heart care, we have created designated Provider Care Teams.  These Care Teams include your primary Cardiologist (physician) and Advanced Practice Providers (APPs -  Physician Assistants and Nurse Practitioners) who all work together to provide you with the care you need, when you need it. . Follow up with PharmD in 4 weeks. [name] Keep appointment with [facility] in April.

## 2019-01-04 NOTE — Progress Notes (Signed)
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 hypopneic events leading to an AHI of 6.8.  We discussed changing him to a fixed pressure versus uptitrating his minimum pressure.  I will first attempt increasing minimum pressure to 16 mm with a range up to 20.  Epworth Sleepiness Scale score calculated in the office today endorsed at 12 consistent with his daytime sleepiness.  I suspect this may be related to his continued events which hopefully will improve with adjustment of his CPAP machine.  His blood pressure today is stable and on repeat by me was 130/76.  I have suggested re-titration of Entresto up to 24/26 mg twice a day.  I have suggested follow-up evaluation with our pharmacist in 4 weeks for medication adjustment.  He has an appointment to see me in April and I will see him for follow-up evaluation at that time.   Medication Adjustments/Labs and Tests Ordered: Current medicines are reviewed at length with the patient today.  Concerns regarding medicines are outlined above.  Medication changes, Labs and Tests ordered today are listed in the Patient Instructions below. Patient  Instructions  Medication Instructions:  Take 1 tablet of Entresto 24/26 twice daily.  If you need a refill on your cardiac medications before your next appointment, please call your pharmacy.   Follow-Up: At [facility], you and your health needs are our priority.  As part of our continuing mission to provide you with exceptional heart care, we have created designated Provider Care Teams.  These Care Teams include your primary Cardiologist (physician) and Advanced Practice Providers (APPs -  Physician Assistants and Nurse Practitioners) who all work together  to provide you with the care you need, when you need it. . Follow up with PharmD in 4 weeks. [name] Keep appointment with [facility] in April.        Signed, Charles Majestic, MD  01/04/2019 8:54 AM    [facility] [address] [place], [place], [place]  [address] Phone: [phone]

## 2019-01-05 DIAGNOSIS — M170 Bilateral primary osteoarthritis of knee: Secondary | ICD-10-CM | POA: Diagnosis not present

## 2019-01-05 DIAGNOSIS — Z79899 Other long term (current) drug therapy: Secondary | ICD-10-CM | POA: Diagnosis not present

## 2019-01-05 DIAGNOSIS — I509 Heart failure, unspecified: Secondary | ICD-10-CM | POA: Diagnosis not present

## 2019-01-05 DIAGNOSIS — Z1331 Encounter for screening for depression: Secondary | ICD-10-CM | POA: Diagnosis not present

## 2019-01-05 DIAGNOSIS — I2510 Atherosclerotic heart disease of native coronary artery without angina pectoris: Secondary | ICD-10-CM | POA: Diagnosis not present

## 2019-01-05 DIAGNOSIS — R791 Abnormal coagulation profile: Secondary | ICD-10-CM | POA: Diagnosis not present

## 2019-01-05 DIAGNOSIS — I4891 Unspecified atrial fibrillation: Secondary | ICD-10-CM | POA: Diagnosis not present

## 2019-01-12 DIAGNOSIS — G4733 Obstructive sleep apnea (adult) (pediatric): Secondary | ICD-10-CM | POA: Diagnosis not present

## 2019-01-15 ENCOUNTER — Other Ambulatory Visit: Payer: Self-pay | Admitting: Internal Medicine

## 2019-01-16 ENCOUNTER — Ambulatory Visit: Payer: BLUE CROSS/BLUE SHIELD | Admitting: Endocrinology

## 2019-01-19 ENCOUNTER — Other Ambulatory Visit: Payer: Self-pay

## 2019-01-19 MED ORDER — "INSULIN SYRINGE-NEEDLE U-100 30G X 1/2"" 0.5 ML MISC": 3 refills | Status: DC

## 2019-01-29 ENCOUNTER — Ambulatory Visit (INDEPENDENT_AMBULATORY_CARE_PROVIDER_SITE_OTHER): Payer: BLUE CROSS/BLUE SHIELD | Admitting: Endocrinology

## 2019-01-29 ENCOUNTER — Encounter: Payer: Self-pay | Admitting: Endocrinology

## 2019-01-29 ENCOUNTER — Other Ambulatory Visit: Payer: Self-pay

## 2019-01-29 VITALS — BP 124/62 | HR 63 | Ht 69.0 in | Wt 295.8 lb

## 2019-01-29 DIAGNOSIS — Z794 Long term (current) use of insulin: Secondary | ICD-10-CM | POA: Diagnosis not present

## 2019-01-29 DIAGNOSIS — E1165 Type 2 diabetes mellitus with hyperglycemia: Secondary | ICD-10-CM | POA: Diagnosis not present
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
## 2019-01-29 NOTE — Patient Instructions (Signed)
Check blood sugars on waking up 4  days a week  Also check blood sugars about 2 hours after meals and do this after different meals by rotation  Recommended blood sugar levels on waking up are 90-130 and about 2 hours after meal is 130-160  Please bring your blood sugar monitor to each visit, thank you  Take 2 Farxiga and next Rx will be 10mg 

## 2019-02-01 ENCOUNTER — Ambulatory Visit: Payer: BLUE CROSS/BLUE SHIELD

## 2019-02-09 ENCOUNTER — Telehealth: Payer: Self-pay | Admitting: Pharmacist Clinician (PhC)/ Clinical Pharmacy Specialist

## 2019-02-09 NOTE — Telephone Encounter (Signed)
   Cardiac Questionnaire: °  ° Since your last visit or hospitalization: ° °  1. Have you been having new or worsening chest pain? *** °  2. Have you been having new or worsening shortness of breath? *** °3. Have you been having new or worsening leg swelling, wt gain, or increase in abdominal girth (pants fitting more tightly)? *** °  4. Have you had any passing out spells? *** °   °*A YES to any of these questions would result in the appointment being kept. °*If all the answers to these questions are NO, we should indicate that given the current situation regarding the worldwide coronarvirus pandemic, at the recommendation of the CDC, we are looking to limit gatherings in our waiting area, and thus will reschedule their appointment beyond four weeks from today.   °_____________  ° °COVID-19 Pre-Screening Questions: ° °• Do you currently have a fever? *** (yes = cancel and refer to pcp for e-visit) °• Have you recently travelled on a cruise, internationally, or to [place], [place], [place], [place], [place], or [place], [place] ([employer]) ? *** (yes = cancel, stay home, monitor symptoms, and contact pcp or initiate e-visit if symptoms develop) °• Have you been in contact with someone that is currently pending confirmation of Covid19 testing or has been confirmed to have the Covid19 virus?  *** (yes = cancel, stay home, away from tested individual, monitor symptoms, and contact pcp or initiate e-visit if symptoms develop) °• Are you currently experiencing fatigue or cough? *** (yes = pt should be prepared to have a mask placed at the time of their visit). ° ° °   ° ° ° ° °

## 2019-02-10 ENCOUNTER — Other Ambulatory Visit: Payer: Self-pay | Admitting: Endocrinology

## 2019-02-10 DIAGNOSIS — G4733 Obstructive sleep apnea (adult) (pediatric): Secondary | ICD-10-CM | POA: Diagnosis not present

## 2019-02-10 DIAGNOSIS — I5042 Chronic combined systolic (congestive) and diastolic (congestive) heart failure: Secondary | ICD-10-CM | POA: Diagnosis not present

## 2019-02-10 DIAGNOSIS — I10 Essential (primary) hypertension: Secondary | ICD-10-CM | POA: Diagnosis not present

## 2019-02-13 ENCOUNTER — Ambulatory Visit: Payer: BLUE CROSS/BLUE SHIELD

## 2019-03-01 ENCOUNTER — Telehealth: Payer: Self-pay | Admitting: Endocrinology

## 2019-03-01 ENCOUNTER — Other Ambulatory Visit: Payer: Self-pay

## 2019-03-01 MED ORDER — DAPAGLIFLOZIN PROPANEDIOL 5 MG PO TABS: 5.0000 mg | ORAL_TABLET | Freq: Every day | ORAL | 2 refills | Status: DC

## 2019-03-01 NOTE — Telephone Encounter (Signed)
Rx sent 

## 2019-03-01 NOTE — Telephone Encounter (Signed)
MEDICATION: Farxiga  [facility]AC[facility] [place] [facility]  IS PATIENT OUT OF MEDICATION: yes  IF NOT; HOW MUCH IS LEFT:   LAST APPOINTMENT DATE: @3 /21/2020  NEXT APPOINTMENT DATE:@6 /12/2018  DO WE HAVE YOUR PERMISSION TO LEAVE A DETAILED MESSAGE: YES  OTHER COMMENTS: per patient his RX needs to be updated to reflect that he taking 2 per day not 1 per day  **Let patient know to contact pharmacy at the end of the day to make sure medication is ready. **  ** Please notify patient to allow 48-72 hours to process**  **Encourage patient to contact the pharmacy for refills or they can request refills through [facility]**

## 2019-03-07 ENCOUNTER — Other Ambulatory Visit: Payer: Self-pay

## 2019-03-07 MED ORDER — INSULIN PEN NEEDLE 29G X 12.7MM MISC: 3 refills | Status: DC

## 2019-03-08 ENCOUNTER — Other Ambulatory Visit: Payer: Self-pay | Admitting: Endocrinology

## 2019-03-12 ENCOUNTER — Telehealth: Payer: Self-pay | Admitting: Cardiovascular Disease

## 2019-03-12 NOTE — Telephone Encounter (Signed)
Spoke to pt to change visit on 4/22 with Dr. Claiborne Billings to virtual. Pt stated he did not want to do telephone or video visit and wanted to reschedule. Pt rescheduled to 07/18/19 at 11:20 AM with Dr. Claiborne Billings. Pt verbalized thanks.

## 2019-03-13 DIAGNOSIS — I10 Essential (primary) hypertension: Secondary | ICD-10-CM | POA: Diagnosis not present

## 2019-03-13 DIAGNOSIS — I5042 Chronic combined systolic (congestive) and diastolic (congestive) heart failure: Secondary | ICD-10-CM | POA: Diagnosis not present

## 2019-03-13 DIAGNOSIS — G4733 Obstructive sleep apnea (adult) (pediatric): Secondary | ICD-10-CM | POA: Diagnosis not present

## 2019-03-14 ENCOUNTER — Ambulatory Visit: Payer: BLUE CROSS/BLUE SHIELD | Admitting: Cardiovascular Disease

## 2019-03-15 ENCOUNTER — Ambulatory Visit: Payer: BLUE CROSS/BLUE SHIELD | Admitting: Cardiovascular Disease

## 2019-03-16 ENCOUNTER — Other Ambulatory Visit: Payer: Self-pay | Admitting: Cardiovascular Disease

## 2019-03-16 DIAGNOSIS — R6884 Jaw pain: Secondary | ICD-10-CM | POA: Diagnosis not present

## 2019-03-16 NOTE — Telephone Encounter (Signed)
Entresto refilled. 

## 2019-03-19 ENCOUNTER — Telehealth: Payer: Self-pay | Admitting: Endocrinology

## 2019-03-19 ENCOUNTER — Other Ambulatory Visit: Payer: Self-pay

## 2019-03-19 MED ORDER — DAPAGLIFLOZIN PROPANEDIOL 5 MG PO TABS: 5.0000 mg | ORAL_TABLET | Freq: Every day | ORAL | 2 refills | Status: DC

## 2019-03-19 NOTE — Telephone Encounter (Signed)
MEDICATION: dapagliflozin propanediol (FARXIGA) 5 MG TABS tablet  [facility]AC[facility]  [id number] [place] [place] -  IS THIS A 90 DAY SUPPLY :   IS PATIENT OUT OF MEDICATION:   IF NOT; HOW MUCH IS LEFT: 1 left  LAST APPOINTMENT DATE: @4 /16/2020  NEXT APPOINTMENT DATE:@6 /12/2018  DO WE HAVE YOUR PERMISSION TO LEAVE A DETAILED MESSAGE:  OTHER COMMENTS:  Patient states that Dr.Kumar changed the dosage to two tablets a day and needs a new RX.  **Let patient know to contact pharmacy at the end of the day to make sure medication is ready. **  ** Please notify patient to allow 48-72 hours to process**  **Encourage patient to contact the pharmacy for refills or they can request refills through [facility]**

## 2019-03-19 NOTE — Telephone Encounter (Signed)
Rx sent 

## 2019-03-20 ENCOUNTER — Telehealth: Payer: Self-pay

## 2019-03-20 NOTE — Telephone Encounter (Signed)
[facility]. Per MD note patient is to.Charles KitchenMarland KitchenTake 2 Farxiga and next Rx will be 10mg - but when prescription was sent it was sent for 5 mg please advise

## 2019-03-20 NOTE — Telephone Encounter (Signed)
Pl. Send 10mg  dose (1 qd)

## 2019-03-21 ENCOUNTER — Other Ambulatory Visit: Payer: Self-pay | Admitting: Internal Medicine

## 2019-03-21 ENCOUNTER — Telehealth: Payer: Self-pay | Admitting: Endocrinology

## 2019-03-21 ENCOUNTER — Other Ambulatory Visit: Payer: Self-pay

## 2019-03-21 MED ORDER — DAPAGLIFLOZIN PROPANEDIOL 10 MG PO TABS: ORAL_TABLET | ORAL | 3 refills | Status: DC

## 2019-03-21 NOTE — Telephone Encounter (Signed)
Farxiga [facility] is [facility] [place] as 1 tablet per day. Patient states that Dr Dwyane Dee told him at his last visit to start taking 2 tablets per day.    Patient is requesting that prescription be update to reflect new dosing instructions.  He is currently out of medication

## 2019-03-21 NOTE — Telephone Encounter (Signed)
Rx corrected and resent.  

## 2019-03-21 NOTE — Telephone Encounter (Signed)
New dose has been sent to pharmacy and d/c old dose

## 2019-03-22 ENCOUNTER — Telehealth: Payer: Self-pay | Admitting: Endocrinology

## 2019-03-22 NOTE — Telephone Encounter (Signed)
Per [facility], "Caller states that he has been trying to get his prescription refilled from the office for several days with no success. He is non-symptomatic and is completely out of this medication now. It is to help with his blood sugar."  Placed in box.

## 2019-03-26 ENCOUNTER — Other Ambulatory Visit: Payer: Self-pay | Admitting: Endocrinology

## 2019-04-05 DIAGNOSIS — R791 Abnormal coagulation profile: Secondary | ICD-10-CM | POA: Diagnosis not present

## 2019-04-05 DIAGNOSIS — M170 Bilateral primary osteoarthritis of knee: Secondary | ICD-10-CM | POA: Diagnosis not present

## 2019-04-05 DIAGNOSIS — E119 Type 2 diabetes mellitus without complications: Secondary | ICD-10-CM | POA: Diagnosis not present

## 2019-04-05 DIAGNOSIS — I4891 Unspecified atrial fibrillation: Secondary | ICD-10-CM | POA: Diagnosis not present

## 2019-04-06 ENCOUNTER — Other Ambulatory Visit: Payer: Self-pay | Admitting: Endocrinology

## 2019-04-12 DIAGNOSIS — R791 Abnormal coagulation profile: Secondary | ICD-10-CM | POA: Diagnosis not present

## 2019-04-12 DIAGNOSIS — G4733 Obstructive sleep apnea (adult) (pediatric): Secondary | ICD-10-CM | POA: Diagnosis not present

## 2019-04-12 DIAGNOSIS — I10 Essential (primary) hypertension: Secondary | ICD-10-CM | POA: Diagnosis not present

## 2019-04-12 DIAGNOSIS — I5042 Chronic combined systolic (congestive) and diastolic (congestive) heart failure: Secondary | ICD-10-CM | POA: Diagnosis not present

## 2019-04-19 DIAGNOSIS — R791 Abnormal coagulation profile: Secondary | ICD-10-CM | POA: Diagnosis not present

## 2019-04-24 ENCOUNTER — Other Ambulatory Visit (INDEPENDENT_AMBULATORY_CARE_PROVIDER_SITE_OTHER): Payer: BLUE CROSS/BLUE SHIELD

## 2019-04-24 ENCOUNTER — Other Ambulatory Visit: Payer: Self-pay

## 2019-04-24 DIAGNOSIS — E1165 Type 2 diabetes mellitus with hyperglycemia: Secondary | ICD-10-CM

## 2019-04-24 DIAGNOSIS — Z794 Long term (current) use of insulin: Secondary | ICD-10-CM

## 2019-04-24 LAB — COMPREHENSIVE METABOLIC PANEL
ALT: 13 U/L (ref 0–53)
AST: 12 U/L (ref 0–37)
Albumin: 3.8 g/dL (ref 3.5–5.2)
Alkaline Phosphatase: 43 U/L (ref 39–117)
BUN: 25 mg/dL — ABNORMAL HIGH (ref 6–23)
CO2: 25 mEq/L (ref 19–32)
Calcium: 8.5 mg/dL (ref 8.4–10.5)
Chloride: 106 mEq/L (ref 96–112)
Creatinine, Ser: 1.1 mg/dL (ref 0.40–1.50)
GFR: 65.74 mL/min (ref >60.00)
Glucose, Bld: 155 mg/dL — ABNORMAL HIGH (ref 70–99)
Potassium: 4.4 mEq/L (ref 3.5–5.1)
Sodium: 137 mEq/L (ref 135–145)
Total Bilirubin: 0.4 mg/dL (ref 0.2–1.2)
Total Protein: 6.7 g/dL (ref 6.0–8.3)

## 2019-04-24 LAB — HEMOGLOBIN A1C: Hgb A1c MFr Bld: 7.5 % — ABNORMAL HIGH (ref 4.6–6.5)

## 2019-04-26 DIAGNOSIS — R791 Abnormal coagulation profile: Secondary | ICD-10-CM | POA: Diagnosis not present

## 2019-05-02 ENCOUNTER — Encounter: Payer: Self-pay | Admitting: Endocrinology

## 2019-05-02 ENCOUNTER — Ambulatory Visit (INDEPENDENT_AMBULATORY_CARE_PROVIDER_SITE_OTHER): Payer: BLUE CROSS/BLUE SHIELD | Admitting: Endocrinology

## 2019-05-02 ENCOUNTER — Other Ambulatory Visit: Payer: Self-pay

## 2019-05-02 VITALS — BP 138/70 | HR 63 | Ht 69.0 in | Wt 289.2 lb

## 2019-05-02 DIAGNOSIS — Z794 Long term (current) use of insulin: Secondary | ICD-10-CM | POA: Diagnosis not present

## 2019-05-02 DIAGNOSIS — E782 Mixed hyperlipidemia: Secondary | ICD-10-CM

## 2019-05-02 DIAGNOSIS — E1165 Type 2 diabetes mellitus with hyperglycemia: Secondary | ICD-10-CM | POA: Diagnosis not present
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
## 2019-05-03 DIAGNOSIS — Z7901 Long term (current) use of anticoagulants: Secondary | ICD-10-CM | POA: Diagnosis not present

## 2019-05-17 DIAGNOSIS — R791 Abnormal coagulation profile: Secondary | ICD-10-CM | POA: Diagnosis not present

## 2019-05-24 DIAGNOSIS — L918 Other hypertrophic disorders of the skin: Secondary | ICD-10-CM | POA: Diagnosis not present

## 2019-05-24 DIAGNOSIS — B079 Viral wart, unspecified: Secondary | ICD-10-CM | POA: Diagnosis not present

## 2019-05-24 DIAGNOSIS — Z6841 Body Mass Index (BMI) 40.0 and over, adult: Secondary | ICD-10-CM | POA: Diagnosis not present

## 2019-05-24 DIAGNOSIS — R110 Nausea: Secondary | ICD-10-CM | POA: Diagnosis not present

## 2019-05-30 ENCOUNTER — Telehealth: Payer: Self-pay | Admitting: Cardiovascular Disease

## 2019-05-30 NOTE — Telephone Encounter (Signed)
  Patient is calling because he is having some off and on side effects from his sotalol (BETAPACE) 120 MG tablet.  Nausea, loss of appetite, muscle weakness, mental confusion, unsteady on his feet are side effects. He stopped taking this medication for 3 days and almost all the symptoms stopped. He would like to discuss changing meds.

## 2019-05-30 NOTE — Telephone Encounter (Signed)
LM2CB 

## 2019-05-31 DIAGNOSIS — R791 Abnormal coagulation profile: Secondary | ICD-10-CM | POA: Diagnosis not present

## 2019-05-31 NOTE — Telephone Encounter (Signed)
Pt called back he states has been having sx of nausea, loss of appetite, muscle weakness, mental confusion, unsteady on his feet are side effects. He states that he has been having this for quite a while and 3 days ago he stopped the sotalol (BETAPACE) 120 MG and now the sx are almost gone. He states that he just wanted to let you know. His BP is running fine 128/78 still a bit high in the morning 150/80 but then goes back down to normal in the afternoon. He states that he is not worried about it just want to let you know.

## 2019-05-31 NOTE — Telephone Encounter (Signed)
LM2CB 

## 2019-06-07 DIAGNOSIS — Z7901 Long term (current) use of anticoagulants: Secondary | ICD-10-CM | POA: Diagnosis not present

## 2019-06-07 NOTE — Telephone Encounter (Signed)
Continue to monitor HR, rhythm and BP

## 2019-06-21 ENCOUNTER — Other Ambulatory Visit: Payer: Self-pay | Admitting: Endocrinology

## 2019-06-21 DIAGNOSIS — R791 Abnormal coagulation profile: Secondary | ICD-10-CM | POA: Diagnosis not present

## 2019-06-25 ENCOUNTER — Encounter (INDEPENDENT_AMBULATORY_CARE_PROVIDER_SITE_OTHER): Payer: Self-pay

## 2019-06-25 ENCOUNTER — Other Ambulatory Visit: Payer: Self-pay

## 2019-06-25 ENCOUNTER — Ambulatory Visit (INDEPENDENT_AMBULATORY_CARE_PROVIDER_SITE_OTHER): Payer: BC Managed Care – PPO | Admitting: *Deleted

## 2019-06-25 DIAGNOSIS — I480 Paroxysmal atrial fibrillation: Secondary | ICD-10-CM | POA: Diagnosis not present

## 2019-06-25 LAB — CUP PACEART INCLINIC DEVICE CHECK
Date Time Interrogation Session: 20200803131301
Implantable Lead Implant Date: 20091007
Implantable Lead Implant Date: 20091007
Implantable Lead Location: 753859
Implantable Lead Location: 753860
Implantable Pulse Generator Implant Date: 20091007
Pulse Gen Model: 5826
Pulse Gen Serial Number: 1266370

## 2019-06-25 NOTE — Progress Notes (Signed)
Pacemaker check in clinic. Normal device function. Thresholds, sensing, impedances consistent with previous measurements. Device programmed to maximize longevity. No high ventricular rates noted. 4 AMS episodes, no available EGMs to review. Mode switch <1%. Device programmed at appropriate safety margins. Histogram distribution appropriate for patient activity level. Device programmed to optimize intrinsic conduction. PPM @ [facility], pt will call to schedule appt (needed to check his calendar when he returned home). Patient education completed.

## 2019-07-05 DIAGNOSIS — H25813 Combined forms of age-related cataract, bilateral: Secondary | ICD-10-CM | POA: Diagnosis not present

## 2019-07-05 DIAGNOSIS — E119 Type 2 diabetes mellitus without complications: Secondary | ICD-10-CM | POA: Diagnosis not present

## 2019-07-05 DIAGNOSIS — R791 Abnormal coagulation profile: Secondary | ICD-10-CM | POA: Diagnosis not present

## 2019-07-05 DIAGNOSIS — H52203 Unspecified astigmatism, bilateral: Secondary | ICD-10-CM | POA: Diagnosis not present

## 2019-07-05 DIAGNOSIS — H43813 Vitreous degeneration, bilateral: Secondary | ICD-10-CM | POA: Diagnosis not present

## 2019-07-05 LAB — HM DIABETES EYE EXAM

## 2019-07-10 DIAGNOSIS — R791 Abnormal coagulation profile: Secondary | ICD-10-CM | POA: Diagnosis not present

## 2019-07-13 ENCOUNTER — Other Ambulatory Visit: Payer: Self-pay | Admitting: Endocrinology

## 2019-07-16 DIAGNOSIS — Z20828 Contact with and (suspected) exposure to other viral communicable diseases: Secondary | ICD-10-CM | POA: Diagnosis not present

## 2019-07-18 ENCOUNTER — Ambulatory Visit: Payer: Self-pay | Admitting: Cardiovascular Disease

## 2019-07-30 ENCOUNTER — Emergency Department (HOSPITAL_COMMUNITY)
Admission: EM | Admit: 2019-07-30 | Discharge: 2019-07-30 | Disposition: A | Discharge status: Home / Self Care | Payer: BC Managed Care – PPO | Attending: Emergency Medicine | Admitting: Emergency Medicine

## 2019-07-30 ENCOUNTER — Other Ambulatory Visit: Payer: Self-pay

## 2019-07-30 ENCOUNTER — Encounter (HOSPITAL_COMMUNITY): Payer: Self-pay | Admitting: Emergency Medicine

## 2019-07-30 ENCOUNTER — Emergency Department (HOSPITAL_COMMUNITY): Payer: BC Managed Care – PPO

## 2019-07-30 DIAGNOSIS — Z87891 Personal history of nicotine dependence: Secondary | ICD-10-CM | POA: Insufficient documentation

## 2019-07-30 DIAGNOSIS — I130 Hypertensive heart and chronic kidney disease with heart failure and stage 1 through stage 4 chronic kidney disease, or unspecified chronic kidney disease: Secondary | ICD-10-CM | POA: Insufficient documentation

## 2019-07-30 DIAGNOSIS — Z8679 Personal history of other diseases of the circulatory system: Secondary | ICD-10-CM

## 2019-07-30 DIAGNOSIS — N183 Chronic kidney disease, stage 3 (moderate): Secondary | ICD-10-CM | POA: Diagnosis not present

## 2019-07-30 DIAGNOSIS — R002 Palpitations: Secondary | ICD-10-CM | POA: Diagnosis not present

## 2019-07-30 DIAGNOSIS — Z950 Presence of cardiac pacemaker: Secondary | ICD-10-CM | POA: Diagnosis not present

## 2019-07-30 DIAGNOSIS — I5043 Acute on chronic combined systolic (congestive) and diastolic (congestive) heart failure: Secondary | ICD-10-CM | POA: Diagnosis not present

## 2019-07-30 DIAGNOSIS — I5042 Chronic combined systolic (congestive) and diastolic (congestive) heart failure: Secondary | ICD-10-CM | POA: Diagnosis not present

## 2019-07-30 DIAGNOSIS — E1122 Type 2 diabetes mellitus with diabetic chronic kidney disease: Secondary | ICD-10-CM | POA: Diagnosis not present

## 2019-07-30 DIAGNOSIS — Z7982 Long term (current) use of aspirin: Secondary | ICD-10-CM | POA: Insufficient documentation

## 2019-07-30 DIAGNOSIS — Z79899 Other long term (current) drug therapy: Secondary | ICD-10-CM | POA: Insufficient documentation

## 2019-07-30 DIAGNOSIS — Z7901 Long term (current) use of anticoagulants: Secondary | ICD-10-CM | POA: Insufficient documentation

## 2019-07-30 DIAGNOSIS — I4819 Other persistent atrial fibrillation: Secondary | ICD-10-CM

## 2019-07-30 DIAGNOSIS — I2510 Atherosclerotic heart disease of native coronary artery without angina pectoris: Secondary | ICD-10-CM | POA: Diagnosis not present

## 2019-07-30 DIAGNOSIS — Z20828 Contact with and (suspected) exposure to other viral communicable diseases: Secondary | ICD-10-CM | POA: Diagnosis not present

## 2019-07-30 DIAGNOSIS — Z794 Long term (current) use of insulin: Secondary | ICD-10-CM | POA: Diagnosis not present

## 2019-07-30 DIAGNOSIS — I4891 Unspecified atrial fibrillation: Secondary | ICD-10-CM | POA: Diagnosis not present

## 2019-07-30 DIAGNOSIS — Z951 Presence of aortocoronary bypass graft: Secondary | ICD-10-CM | POA: Diagnosis not present

## 2019-07-30 DIAGNOSIS — R0789 Other chest pain: Secondary | ICD-10-CM | POA: Diagnosis not present

## 2019-07-30 DIAGNOSIS — R52 Pain, unspecified: Secondary | ICD-10-CM | POA: Diagnosis not present

## 2019-07-30 DIAGNOSIS — R079 Chest pain, unspecified: Secondary | ICD-10-CM | POA: Diagnosis not present

## 2019-07-30 DIAGNOSIS — R0902 Hypoxemia: Secondary | ICD-10-CM | POA: Diagnosis not present

## 2019-07-30 LAB — BASIC METABOLIC PANEL
Anion gap: 8 (ref 5–15)
BUN: 14 mg/dL (ref 8–23)
CO2: 23 mmol/L (ref 22–32)
Calcium: 8.8 mg/dL — ABNORMAL LOW (ref 8.9–10.3)
Chloride: 107 mmol/L (ref 98–111)
Creatinine, Ser: 1.15 mg/dL (ref 0.61–1.24)
GFR calc Af Amer: >60 mL/min (ref >60)
GFR calc non Af Amer: >60 mL/min (ref >60)
Glucose, Bld: 153 mg/dL — ABNORMAL HIGH (ref 70–99)
Potassium: 4.5 mmol/L (ref 3.5–5.1)
Sodium: 138 mmol/L (ref 135–145)

## 2019-07-30 LAB — CBC
HCT: 45.4 % (ref 39.0–52.0)
Hemoglobin: 14.3 g/dL (ref 13.0–17.0)
MCH: 30 pg (ref 26.0–34.0)
MCHC: 31.5 g/dL (ref 30.0–36.0)
MCV: 95.4 fL (ref 80.0–100.0)
Platelets: 209 10*3/uL (ref 150–400)
RBC: 4.76 MIL/uL (ref 4.22–5.81)
RDW: 14.1 % (ref 11.5–15.5)
WBC: 7.8 10*3/uL (ref 4.0–10.5)
nRBC: 0 % (ref 0.0–0.2)

## 2019-07-30 LAB — TROPONIN I (HIGH SENSITIVITY)
Troponin I (High Sensitivity): 14 ng/L (ref <18)
Troponin I (High Sensitivity): 15 ng/L (ref <18)

## 2019-07-30 LAB — PROTIME-INR
INR: 2.2 — ABNORMAL HIGH (ref 0.8–1.2)
Prothrombin Time: 23.7 seconds — ABNORMAL HIGH (ref 11.4–15.2)

## 2019-07-30 MED ORDER — SODIUM CHLORIDE 0.9% FLUSH: 3.0000 mL | Freq: Once | INTRAVENOUS | Status: DC

## 2019-07-30 MED ORDER — AMIODARONE HCL 200 MG PO TABS: 200.0000 mg | ORAL_TABLET | Freq: Two times a day (BID) | ORAL | 0 refills | Status: DC

## 2019-07-30 MED ORDER — AMIODARONE IV BOLUS ONLY 150 MG/100ML
150.0000 mg | Freq: Once | INTRAVENOUS | Status: AC
  Administered 2019-07-30: 150 mg via INTRAVENOUS
  Filled 2019-07-30: qty 100

## 2019-07-30 MED ORDER — ETOMIDATE 2 MG/ML IV SOLN
10.0000 mg | Freq: Once | INTRAVENOUS | Status: DC
  Filled 2019-07-30: qty 10

## 2019-07-30 MED ORDER — ETOMIDATE 2 MG/ML IV SOLN
INTRAVENOUS | Status: AC | PRN
  Administered 2019-07-30: 10 mg via INTRAVENOUS

## 2019-07-30 MED ORDER — METOPROLOL TARTRATE 5 MG/5ML IV SOLN
5.0000 mg | Freq: Once | INTRAVENOUS | Status: AC
  Administered 2019-07-30: 16:00:00 5 mg via INTRAVENOUS
  Filled 2019-07-30: qty 5

## 2019-07-30 MED ORDER — METOPROLOL SUCCINATE ER 25 MG PO TB24
50.0000 mg | ORAL_TABLET | Freq: Every day | ORAL | Status: DC
  Administered 2019-07-30: 50 mg via ORAL
  Filled 2019-07-30: qty 2

## 2019-07-30 MED ORDER — ASPIRIN 81 MG PO CHEW
324.0000 mg | CHEWABLE_TABLET | Freq: Once | ORAL | Status: AC
  Administered 2019-07-30: 324 mg via ORAL
  Filled 2019-07-30: qty 4

## 2019-07-30 NOTE — ED Notes (Signed)
Patient states he woke up at 8am with c/o fluttering in his chest and pain in his lower jaw. States pain subsided after 3 ntg. States his teeth have a "itchy feeling " now. No chest pain. Hx. Of CABG x 4 08/2016. States he felt unsteady on his feet. Denies sob n/v.

## 2019-07-30 NOTE — Discharge Instructions (Signed)
You have been started on amiodarone per discussion with Cardiology. Please follow up with the AFib clinic for further medication adjustments.  You will need to closely monitor your coumadin (INR) while on this medication and may need dose adjustments.

## 2019-07-30 NOTE — ED Provider Notes (Signed)
Physical Exam  BP 116/74   Pulse 73   Temp (!) 97.5 F (36.4 C) (Oral)   Resp 17   SpO2 100%   Physical Exam Vitals signs and nursing note reviewed.  Constitutional:      General: He is not in acute distress.    Appearance: He is well-developed. He is not diaphoretic.  HENT:     Head: Normocephalic and atraumatic.  Eyes:     Conjunctiva/sclera: Conjunctivae normal.  Neck:     Musculoskeletal: Normal range of motion.  Cardiovascular:     Rate and Rhythm: Regular rhythm. Tachycardia present.     Heart sounds: Normal heart sounds. No murmur. No friction rub. No gallop.   Pulmonary:     Effort: Pulmonary effort is normal. No respiratory distress.     Breath sounds: Normal breath sounds. No wheezing or rales.  Abdominal:     General: There is no distension.     Palpations: Abdomen is soft.     Tenderness: There is no abdominal tenderness. There is no guarding.  Skin:    General: Skin is warm and dry.  Neurological:     Mental Status: He is alert and oriented to person, place, and time.     ED Course/Procedures     .Sedation  Date/Time: 07/31/2019 11:07 PM Performed by: Gareth Morgan, MD Authorized by: Gareth Morgan, MD   Consent:    Consent obtained:  Written   Consent given by:  Patient   Risks discussed:  Allergic reaction, dysrhythmia, inadequate sedation, nausea, respiratory compromise necessitating ventilatory assistance and intubation, prolonged hypoxia resulting in organ damage and vomiting   Alternatives discussed:  Analgesia without sedation and anxiolysis Universal protocol:    Procedure explained and questions answered to patient or proxy's satisfaction: yes     Relevant documents present and verified: yes     Test results available and properly labeled: yes     Imaging studies available: yes     Required blood products, implants, devices, and special equipment available: yes     Site/side marked: yes     Immediately prior to procedure a time out  was called: yes     Patient identity confirmation method:  Verbally with patient Indications:    Procedure performed:  Cardioversion   Procedure necessitating sedation performed by:  Different physician Pre-sedation assessment:    Time since last food or drink:  8   ASA classification: class 3 - patient with severe systemic disease     Neck mobility: normal     Mouth opening:  3 or more finger widths   Thyromental distance:  3 finger widths   Mallampati score:  III - soft palate, base of uvula visible   Pre-sedation assessments completed and reviewed: airway patency, cardiovascular function, hydration status, mental status, nausea/vomiting and pain level   Immediate pre-procedure details:    Reassessment: Patient reassessed immediately prior to procedure     Reviewed: vital signs and NPO status     Verified: bag valve mask available, emergency equipment available, intubation equipment available, IV patency confirmed and oxygen available   Procedure details (see MAR for exact dosages):    Preoxygenation:  Nasal cannula   Sedation:  Etomidate   Intra-procedure monitoring:  Blood pressure monitoring, cardiac monitor, continuous capnometry and continuous pulse oximetry   Intra-procedure events: none     Intra-procedure management:  Airway repositioning (OSA)   Total Provider sedation time (minutes):  15 Post-procedure details:    Attendance: Constant attendance by  certified staff until patient recovered     Recovery: Patient returned to pre-procedure baseline     Patient is stable for discharge or admission: yes     Patient tolerance:  Tolerated well, no immediate complications    MDM   Received care of patient from Dr. Sherry Ruffing and Rondel Oh PA-C. Briefly, this is a 73yo male who presents with jaw pain and palpitations, found to be in atrial flutter.  Troponins stable.  Cardiology consulted.  Cardiology recommends Cardioversion. Dr. Oval Linsey looked over last INRs which were therapeutic.     Sedated with 10mg  etomidate and cardioverted 150J to sinus rhythm and tolerated procedure well. Back to baseline and denies any continuing jaw pain.  Had some ectopy following cardioversion and Dr. Oval Linsey had discussed potentially starting amiodarone. Discussed with Dr. Johnsie Cancel and started amio 200 BID with 150mg  bolus in ED. Pt has been off of sotalol.  He is to follow up with afib clinic this week.         Gareth Morgan, MD 07/31/19 [id number]

## 2019-07-30 NOTE — Progress Notes (Signed)
Called by ER regarding post [facility] care Patient cardioverted by Dr Oval Linsey earlier Has pacer needs f/u with Dr [name] for ERI Post conversion had SR with A pacing long PR And frequent PAC;s EF 30% by echo Given 150 mg iv amiodarone bolus To be d/c with outpatient f/u Dr Kelly/[name] PO amiodarone 200 mg bid for 2 weeks then daily He is in quarantine for COVID as wife is positive  [name]

## 2019-07-30 NOTE — Sedation Documentation (Signed)
150 j cardioversion
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
## 2019-07-30 NOTE — ED Triage Notes (Signed)
Pt arrives with [facility] from home where pt called out due to left sided jaw pain and palpations that started at 0800. Pt took 2 Nitro sl that did relieve his pain. Pt has hx of bypass x3, pacemaker & afib.

## 2019-07-30 NOTE — ED Notes (Signed)
Patient verbalizes understanding of discharge instructions. Opportunity for questioning and answers were provided. Armband removed by staff, pt discharged from ED.  

## 2019-07-30 NOTE — CV Procedure (Signed)
Electrical Cardioversion Procedure Note EMMYTT REGGIO [id number] Oct 09, 1946  Procedure: Electrical Cardioversion Indications:  Atrial Flutter  Procedure Details Consent: Risks of procedure as well as the alternatives and risks of each were explained to the (patient/caregiver).  Consent for procedure obtained. Time Out: Verified patient identification, verified procedure, site/side was marked, verified correct patient position, special equipment/implants available, medications/allergies/relevent history reviewed, required imaging and test results available.  Performed  Patient placed on cardiac monitor, pulse oximetry, supplemental oxygen as necessary.  Sedation given: Etomidate Pacer pads placed anterior and posterior chest.  Cardioverted 1 time(s).  Cardioverted at 150J.  Evaluation Findings: Post procedure EKG shows: APVS Complications: None Patient did tolerate procedure well.   Skeet Latch, MD 07/30/2019, 6:37 PM
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
## 2019-07-31 ENCOUNTER — Telehealth: Payer: Self-pay | Admitting: *Deleted

## 2019-07-31 ENCOUNTER — Telehealth: Payer: Self-pay | Admitting: Cardiovascular Disease

## 2019-07-31 ENCOUNTER — Telehealth (HOSPITAL_COMMUNITY): Payer: Self-pay | Admitting: *Deleted

## 2019-07-31 ENCOUNTER — Telehealth: Payer: Self-pay | Admitting: Endocrinology

## 2019-07-31 ENCOUNTER — Encounter: Payer: Self-pay | Admitting: Pharmacist

## 2019-07-31 ENCOUNTER — Telehealth: Payer: Self-pay | Admitting: Internal Medicine

## 2019-07-31 ENCOUNTER — Other Ambulatory Visit: Payer: BLUE CROSS/BLUE SHIELD

## 2019-07-31 LAB — SARS CORONAVIRUS 2 (TAT 6-24 HRS): SARS Coronavirus 2: NEGATIVE

## 2019-07-31 MED ORDER — AMIODARONE HCL 200 MG PO TABS: 200.0000 mg | ORAL_TABLET | Freq: Two times a day (BID) | ORAL | 3 refills | Status: DC

## 2019-07-31 NOTE — Telephone Encounter (Signed)
Noted  

## 2019-07-31 NOTE — Telephone Encounter (Signed)
New Message   Pt c/o medication issue:  1. Name of Medication: Amiodarone  2. How are you currently taking this medication (dosage and times per day)?  200mg   1 tablet 2 times daily   3. Are you having a reaction (difficulty breathing--STAT)? No  4. What is your medication issue? Patient was in the ER over the weekend and entered Afib so the doctor prescribed the medication to him.  Patient would like to make the doctor aware of the medication addition and talk about it.  Please call patient back.

## 2019-07-31 NOTE — Telephone Encounter (Signed)
Returned call to pt he states that he cannot feel the AFIB he states that his BP is fine 127/75 HR 68. He states that he just feels a little fatigued he has not started the amiodarone yet, he states that his wife is going to p/u today and he will start ASAP. He denies any other sx chest pain or pressure, sweating, jaw pain, etc. He states that he has not been in AFIB for quite a few years. He will CB if any other sx develop for fatigue worsens

## 2019-07-31 NOTE — Telephone Encounter (Signed)
Left message to return call 

## 2019-07-31 NOTE — Telephone Encounter (Signed)
  Patient was told at last visit he would soon needs his pacemaker switched out. He is asking if it can be done before the end of September due to insurance coverage. His wife lost her job and insurance will run out at the end of this month.

## 2019-07-31 NOTE — Telephone Encounter (Signed)
New Message     Edwin Cap is calling and says the pharmacy needs clarification on what medication the pt needs to be taking   Please call the pharmacy  [facility] [phone]

## 2019-07-31 NOTE — Progress Notes (Signed)
This encounter was created in error - please disregard.

## 2019-07-31 NOTE — Telephone Encounter (Signed)
Returned call to [place] she states to call the pharmacy and discuss medications.  [facility] he states that he needs direction for amiodarone and coumadin. Please advise on medications

## 2019-07-31 NOTE — Telephone Encounter (Signed)
Called pt and phone went straight to voicemail and left detailed voicemail requesting that the pt call his pharmacy and see if they have another manufacturer that has metformin that has not been recalled, and if they do not, to call another pharmacy that the pt can use and see if they do.

## 2019-07-31 NOTE — Telephone Encounter (Signed)
Patient ph# [phone] called re: Patient received a letter from his PHARM stating that his Metformin has been recalled due to unexpected impurities that nay cause a hazard. Please call patient at the ph# listed above to advise.

## 2019-07-31 NOTE — Telephone Encounter (Signed)
Okay Rx for amiodarone as prescribed by ER physician. Follow up appointment scheduled for 08/09/2019.  F/U INR scheduled with PDP Sept/10 at 10:30am

## 2019-07-31 NOTE — Telephone Encounter (Addendum)
TOC CM received call from [facility] Pharmacist, [phone] with questions about Sotalol and Amiodarone being used together. Message sent to ED provider, Dr Billy Fischer. Pharmacist will follow up with Cardiologist that prescribed Sotalol, Dr [name]. Jonnie Finner RN CCM, [facility] ED TOC CM [phone]  1216 pm Received confirmation from ED provider that pt's Sotalol was discontinued. Contacted Cardiology office and left message with office for Dr Claiborne Billings to follow up on correct medication. Message sent back to Dr Billy Fischer waiting on Cardiology to confirm correct medication. TOC CM will follow up with [facility] and patient once confirmed.  Jonnie Finner RN CCM, [facility] ED TOC CM [phone]  1245 pm Received call back from Dr Evette Georges office, spoke to [place]. Will [facility] to clarify meds. Pt will be on Amiodarone, office will call in 30 day supply. Updating ED provided. [place], [place] ED TOC CM [phone]

## 2019-07-31 NOTE — Telephone Encounter (Signed)
-----   Message from Sherran Needs, NP sent at 07/31/2019 12:18 PM EDT ----- Regarding: FW: AFib Clinic Appt Will forward to Rushie Goltz, RN to schedule appointment. ----- Message ----- From: Darreld Mclean, PA-C Sent: 07/31/2019  12:07 PM EDT To: Sherran Needs, NP Subject: AFib [place],  This patient was seen in the ED last night for atrial fibrillation. He underwent DCCV and then was discharged home. Per Dr. Oval Linsey, he needs close follow-up with EP/Afib clinic this week. He is currently self-quarantining because his wife tested positive for COVID. Therefore, will need to be a virtual visit. Can you help set this up? Trish told me you were a good contact person for this, but if I should reach out to someone else just let me know.  Thanks so much! Callie

## 2019-08-01 DIAGNOSIS — Z7901 Long term (current) use of anticoagulants: Secondary | ICD-10-CM | POA: Diagnosis not present

## 2019-08-01 NOTE — Telephone Encounter (Signed)
Pt has follow up next week with Dr. Claiborne Billings office on 9/17 would prefer to keep that appt and not make appt in afib clinic at this time. Pt instructed to keep follow up.

## 2019-08-02 DIAGNOSIS — I10 Essential (primary) hypertension: Secondary | ICD-10-CM | POA: Diagnosis not present

## 2019-08-02 DIAGNOSIS — G4733 Obstructive sleep apnea (adult) (pediatric): Secondary | ICD-10-CM | POA: Diagnosis not present

## 2019-08-02 NOTE — Telephone Encounter (Signed)
Given EF 30% and LBBB with QRS > 182msec, I would like to talk to him about possible upgrade to biventricular ICD.  Please put him on my schedule for virtual visit the next time I am in clinic.  We should be able to perform procedure during September due to insurance reasons.

## 2019-08-03 ENCOUNTER — Ambulatory Visit: Payer: BLUE CROSS/BLUE SHIELD | Admitting: Endocrinology

## 2019-08-06 ENCOUNTER — Ambulatory Visit: Payer: BC Managed Care – PPO | Admitting: Physician Assistant
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
## 2019-08-09 ENCOUNTER — Ambulatory Visit (INDEPENDENT_AMBULATORY_CARE_PROVIDER_SITE_OTHER): Payer: BC Managed Care – PPO | Admitting: Medical

## 2019-08-09 ENCOUNTER — Encounter: Payer: Self-pay | Admitting: Medical

## 2019-08-09 ENCOUNTER — Telehealth: Payer: Self-pay | Admitting: Cardiovascular Disease

## 2019-08-09 ENCOUNTER — Other Ambulatory Visit: Payer: Self-pay

## 2019-08-09 VITALS — BP 143/72 | HR 69 | Ht 69.0 in | Wt 279.8 lb

## 2019-08-09 DIAGNOSIS — I480 Paroxysmal atrial fibrillation: Secondary | ICD-10-CM | POA: Diagnosis not present

## 2019-08-09 DIAGNOSIS — I5042 Chronic combined systolic (congestive) and diastolic (congestive) heart failure: Secondary | ICD-10-CM | POA: Diagnosis not present

## 2019-08-09 DIAGNOSIS — E1159 Type 2 diabetes mellitus with other circulatory complications: Secondary | ICD-10-CM

## 2019-08-09 DIAGNOSIS — Z794 Long term (current) use of insulin: Secondary | ICD-10-CM

## 2019-08-09 DIAGNOSIS — I10 Essential (primary) hypertension: Secondary | ICD-10-CM | POA: Diagnosis not present

## 2019-08-09 DIAGNOSIS — G4733 Obstructive sleep apnea (adult) (pediatric): Secondary | ICD-10-CM | POA: Diagnosis not present

## 2019-08-09 DIAGNOSIS — Z951 Presence of aortocoronary bypass graft: Secondary | ICD-10-CM

## 2019-08-09 DIAGNOSIS — I2510 Atherosclerotic heart disease of native coronary artery without angina pectoris: Secondary | ICD-10-CM

## 2019-08-09 DIAGNOSIS — Z9989 Dependence on other enabling machines and devices: Secondary | ICD-10-CM

## 2019-08-09 DIAGNOSIS — I495 Sick sinus syndrome: Secondary | ICD-10-CM

## 2019-08-09 DIAGNOSIS — N183 Chronic kidney disease, stage 3 unspecified: Secondary | ICD-10-CM

## 2019-08-09 DIAGNOSIS — I255 Ischemic cardiomyopathy: Secondary | ICD-10-CM

## 2019-08-09 MED ORDER — CARVEDILOL 3.125 MG PO TABS: 3.1250 mg | ORAL_TABLET | Freq: Two times a day (BID) | ORAL | 3 refills | Status: DC

## 2019-08-09 NOTE — Telephone Encounter (Signed)
New message:    Patient is taking a pain medication and needs his sister to come with him to appt.

## 2019-08-09 NOTE — Telephone Encounter (Signed)
FYI, patient states he is on pain medication causing issues with memory, his sister would like to come to appointment this morning at 10:45.  Will route over.

## 2019-08-09 NOTE — Patient Instructions (Signed)
Medication Instructions:  START Carvedilol 3.125 mg twice daily.  Continue Amiodarone 200 mg twice daily. On 08/14/19, start Amiodarone 200 mg once daily.  If you need a refill on your cardiac medications before your next appointment, please call your pharmacy.   Follow-Up: At [facility], you and your health needs are our priority.  As part of our continuing mission to provide you with exceptional heart care, we have created designated Provider Care Teams.  These Care Teams include your primary Cardiologist (physician) and Advanced Practice Providers (APPs -  Physician Assistants and Nurse Practitioners) who all work together to provide you with the care you need, when you need it. You will need a follow up appointment in 3 months. You may see Shelva Majestic, MD or one of the following Advanced Practice Providers on your designated Care Team: [place], [place] . Fabian Sharp, PA-C  Any Other Special Instructions Will Be Listed Below (If Applicable). Your physician has requested that you regularly monitor and record your blood pressure and heart rate readings at home. Please use the same machine at the same time of day to check your readings and record them to bring to your follow-up visit.

## 2019-08-10 ENCOUNTER — Encounter: Payer: Self-pay | Admitting: Internal Medicine

## 2019-08-10 ENCOUNTER — Telehealth: Payer: Self-pay

## 2019-08-10 ENCOUNTER — Telehealth (INDEPENDENT_AMBULATORY_CARE_PROVIDER_SITE_OTHER): Payer: Medicare Other | Admitting: Internal Medicine

## 2019-08-10 VITALS — BP 155/75 | HR 63 | Ht 69.0 in | Wt 275.4 lb

## 2019-08-10 DIAGNOSIS — I255 Ischemic cardiomyopathy: Secondary | ICD-10-CM

## 2019-08-10 DIAGNOSIS — I519 Heart disease, unspecified: Secondary | ICD-10-CM | POA: Diagnosis not present

## 2019-08-10 DIAGNOSIS — I495 Sick sinus syndrome: Secondary | ICD-10-CM | POA: Diagnosis not present

## 2019-08-10 DIAGNOSIS — I5042 Chronic combined systolic (congestive) and diastolic (congestive) heart failure: Secondary | ICD-10-CM

## 2019-08-10 NOTE — Telephone Encounter (Signed)
-----   Message from Thompson Grayer, MD sent at 08/10/2019 11:41 AM EDT ----- Needs an echo... if EF <35%, would schedule upgrade to CRT D.  If EF >35%, would schedule upgrade to CRT P.  Due to insurance he needs prior to the first of October to have his device upgrade.

## 2019-08-12 ENCOUNTER — Emergency Department (HOSPITAL_COMMUNITY): Payer: BC Managed Care – PPO

## 2019-08-12 ENCOUNTER — Other Ambulatory Visit: Payer: Self-pay

## 2019-08-12 ENCOUNTER — Telehealth: Payer: Self-pay | Admitting: Student

## 2019-08-12 ENCOUNTER — Inpatient Hospital Stay (HOSPITAL_COMMUNITY)
Admission: EM | Admit: 2019-08-12 | Discharge: 2019-08-15 | DRG: 243 | Disposition: A | Discharge status: Home / Self Care | Payer: BC Managed Care – PPO | Attending: Internal Medicine | Admitting: Internal Medicine

## 2019-08-12 DIAGNOSIS — M170 Bilateral primary osteoarthritis of knee: Secondary | ICD-10-CM | POA: Diagnosis not present

## 2019-08-12 DIAGNOSIS — N183 Chronic kidney disease, stage 3 (moderate): Secondary | ICD-10-CM | POA: Diagnosis present

## 2019-08-12 DIAGNOSIS — R55 Syncope and collapse: Secondary | ICD-10-CM

## 2019-08-12 DIAGNOSIS — M19041 Primary osteoarthritis, right hand: Secondary | ICD-10-CM | POA: Diagnosis present

## 2019-08-12 DIAGNOSIS — Z951 Presence of aortocoronary bypass graft: Secondary | ICD-10-CM

## 2019-08-12 DIAGNOSIS — E669 Obesity, unspecified: Secondary | ICD-10-CM | POA: Diagnosis present

## 2019-08-12 DIAGNOSIS — I447 Left bundle-branch block, unspecified: Secondary | ICD-10-CM | POA: Diagnosis not present

## 2019-08-12 DIAGNOSIS — I480 Paroxysmal atrial fibrillation: Secondary | ICD-10-CM | POA: Diagnosis not present

## 2019-08-12 DIAGNOSIS — I517 Cardiomegaly: Secondary | ICD-10-CM | POA: Diagnosis not present

## 2019-08-12 DIAGNOSIS — Z794 Long term (current) use of insulin: Secondary | ICD-10-CM

## 2019-08-12 DIAGNOSIS — I252 Old myocardial infarction: Secondary | ICD-10-CM

## 2019-08-12 DIAGNOSIS — E1142 Type 2 diabetes mellitus with diabetic polyneuropathy: Secondary | ICD-10-CM | POA: Diagnosis present

## 2019-08-12 DIAGNOSIS — R42 Dizziness and giddiness: Secondary | ICD-10-CM | POA: Diagnosis not present

## 2019-08-12 DIAGNOSIS — Z888 Allergy status to other drugs, medicaments and biological substances status: Secondary | ICD-10-CM

## 2019-08-12 DIAGNOSIS — E785 Hyperlipidemia, unspecified: Secondary | ICD-10-CM | POA: Diagnosis not present

## 2019-08-12 DIAGNOSIS — I255 Ischemic cardiomyopathy: Secondary | ICD-10-CM | POA: Diagnosis present

## 2019-08-12 DIAGNOSIS — Z959 Presence of cardiac and vascular implant and graft, unspecified: Secondary | ICD-10-CM

## 2019-08-12 DIAGNOSIS — I2510 Atherosclerotic heart disease of native coronary artery without angina pectoris: Secondary | ICD-10-CM | POA: Diagnosis present

## 2019-08-12 DIAGNOSIS — I5022 Chronic systolic (congestive) heart failure: Secondary | ICD-10-CM | POA: Diagnosis present

## 2019-08-12 DIAGNOSIS — I10 Essential (primary) hypertension: Secondary | ICD-10-CM | POA: Diagnosis not present

## 2019-08-12 DIAGNOSIS — I495 Sick sinus syndrome: Secondary | ICD-10-CM | POA: Diagnosis present

## 2019-08-12 DIAGNOSIS — Z950 Presence of cardiac pacemaker: Secondary | ICD-10-CM | POA: Diagnosis not present

## 2019-08-12 DIAGNOSIS — Z881 Allergy status to other antibiotic agents status: Secondary | ICD-10-CM

## 2019-08-12 DIAGNOSIS — Z8249 Family history of ischemic heart disease and other diseases of the circulatory system: Secondary | ICD-10-CM

## 2019-08-12 DIAGNOSIS — Z7901 Long term (current) use of anticoagulants: Secondary | ICD-10-CM | POA: Diagnosis not present

## 2019-08-12 DIAGNOSIS — K219 Gastro-esophageal reflux disease without esophagitis: Secondary | ICD-10-CM | POA: Diagnosis present

## 2019-08-12 DIAGNOSIS — I509 Heart failure, unspecified: Secondary | ICD-10-CM | POA: Diagnosis not present

## 2019-08-12 DIAGNOSIS — Z91040 Latex allergy status: Secondary | ICD-10-CM

## 2019-08-12 DIAGNOSIS — Z7982 Long term (current) use of aspirin: Secondary | ICD-10-CM | POA: Diagnosis not present

## 2019-08-12 DIAGNOSIS — M19042 Primary osteoarthritis, left hand: Secondary | ICD-10-CM | POA: Diagnosis present

## 2019-08-12 DIAGNOSIS — G4733 Obstructive sleep apnea (adult) (pediatric): Secondary | ICD-10-CM | POA: Diagnosis present

## 2019-08-12 DIAGNOSIS — Z45010 Encounter for checking and testing of cardiac pacemaker pulse generator [battery]: Secondary | ICD-10-CM

## 2019-08-12 DIAGNOSIS — Z91048 Other nonmedicinal substance allergy status: Secondary | ICD-10-CM

## 2019-08-12 DIAGNOSIS — Z8371 Family history of colonic polyps: Secondary | ICD-10-CM

## 2019-08-12 DIAGNOSIS — Z20828 Contact with and (suspected) exposure to other viral communicable diseases: Secondary | ICD-10-CM | POA: Diagnosis not present

## 2019-08-12 DIAGNOSIS — I130 Hypertensive heart and chronic kidney disease with heart failure and stage 1 through stage 4 chronic kidney disease, or unspecified chronic kidney disease: Secondary | ICD-10-CM | POA: Diagnosis present

## 2019-08-12 DIAGNOSIS — M479 Spondylosis, unspecified: Secondary | ICD-10-CM | POA: Diagnosis not present

## 2019-08-12 DIAGNOSIS — I340 Nonrheumatic mitral (valve) insufficiency: Secondary | ICD-10-CM | POA: Diagnosis present

## 2019-08-12 DIAGNOSIS — Z87891 Personal history of nicotine dependence: Secondary | ICD-10-CM

## 2019-08-12 DIAGNOSIS — E1122 Type 2 diabetes mellitus with diabetic chronic kidney disease: Secondary | ICD-10-CM | POA: Diagnosis present

## 2019-08-12 DIAGNOSIS — Z801 Family history of malignant neoplasm of trachea, bronchus and lung: Secondary | ICD-10-CM

## 2019-08-12 DIAGNOSIS — Z8261 Family history of arthritis: Secondary | ICD-10-CM

## 2019-08-12 DIAGNOSIS — I519 Heart disease, unspecified: Secondary | ICD-10-CM | POA: Diagnosis not present

## 2019-08-12 DIAGNOSIS — Z6841 Body Mass Index (BMI) 40.0 and over, adult: Secondary | ICD-10-CM

## 2019-08-12 DIAGNOSIS — Z820 Family history of epilepsy and other diseases of the nervous system: Secondary | ICD-10-CM

## 2019-08-12 DIAGNOSIS — Z885 Allergy status to narcotic agent status: Secondary | ICD-10-CM

## 2019-08-12 DIAGNOSIS — Z823 Family history of stroke: Secondary | ICD-10-CM

## 2019-08-12 DIAGNOSIS — G894 Chronic pain syndrome: Secondary | ICD-10-CM | POA: Diagnosis present

## 2019-08-12 LAB — CBC WITH DIFFERENTIAL/PLATELET
Abs Immature Granulocytes: 0.02 10*3/uL (ref 0.00–0.07)
Basophils Absolute: 0 10*3/uL (ref 0.0–0.1)
Basophils Relative: 0 %
Eosinophils Absolute: 0.1 10*3/uL (ref 0.0–0.5)
Eosinophils Relative: 2 %
HCT: 44.3 % (ref 39.0–52.0)
Hemoglobin: 14.5 g/dL (ref 13.0–17.0)
Immature Granulocytes: 0 %
Lymphocytes Relative: 12 %
Lymphs Abs: 0.9 10*3/uL (ref 0.7–4.0)
MCH: 31 pg (ref 26.0–34.0)
MCHC: 32.7 g/dL (ref 30.0–36.0)
MCV: 94.7 fL (ref 80.0–100.0)
Monocytes Absolute: 0.5 10*3/uL (ref 0.1–1.0)
Monocytes Relative: 6 %
Neutro Abs: 6 10*3/uL (ref 1.7–7.7)
Neutrophils Relative %: 80 %
Platelets: 188 10*3/uL (ref 150–400)
RBC: 4.68 MIL/uL (ref 4.22–5.81)
RDW: 13.9 % (ref 11.5–15.5)
WBC: 7.5 10*3/uL (ref 4.0–10.5)
nRBC: 0 % (ref 0.0–0.2)

## 2019-08-12 LAB — BASIC METABOLIC PANEL
Anion gap: 7 (ref 5–15)
BUN: 19 mg/dL (ref 8–23)
CO2: 26 mmol/L (ref 22–32)
Calcium: 8.7 mg/dL — ABNORMAL LOW (ref 8.9–10.3)
Chloride: 106 mmol/L (ref 98–111)
Creatinine, Ser: 1.26 mg/dL — ABNORMAL HIGH (ref 0.61–1.24)
GFR calc Af Amer: >60 mL/min (ref >60)
GFR calc non Af Amer: 57 mL/min — ABNORMAL LOW (ref >60)
Glucose, Bld: 125 mg/dL — ABNORMAL HIGH (ref 70–99)
Potassium: 4.7 mmol/L (ref 3.5–5.1)
Sodium: 139 mmol/L (ref 135–145)

## 2019-08-12 LAB — PROTIME-INR
INR: 1.6 — ABNORMAL HIGH (ref 0.8–1.2)
Prothrombin Time: 19 seconds — ABNORMAL HIGH (ref 11.4–15.2)

## 2019-08-12 LAB — SARS CORONAVIRUS 2 BY RT PCR (HOSPITAL ORDER, PERFORMED IN ~~LOC~~ HOSPITAL LAB): SARS Coronavirus 2: NEGATIVE

## 2019-08-12 LAB — GLUCOSE, CAPILLARY: Glucose-Capillary: 152 mg/dL — ABNORMAL HIGH (ref 70–99)

## 2019-08-12 LAB — MAGNESIUM: Magnesium: 2.1 mg/dL (ref 1.7–2.4)

## 2019-08-12 MED ORDER — INSULIN GLARGINE 100 UNIT/ML ~~LOC~~ SOLN
42.0000 [IU] | Freq: Every day | SUBCUTANEOUS | Status: DC
  Administered 2019-08-13 – 2019-08-15 (×3): 42 [IU] via SUBCUTANEOUS
  Filled 2019-08-12 (×3): qty 0.42

## 2019-08-12 MED ORDER — ALFUZOSIN HCL ER 10 MG PO TB24
10.0000 mg | ORAL_TABLET | Freq: Every day | ORAL | Status: DC
  Administered 2019-08-13 – 2019-08-15 (×3): 10 mg via ORAL
  Filled 2019-08-12 (×3): qty 1

## 2019-08-12 MED ORDER — ROSUVASTATIN CALCIUM 5 MG PO TABS
7.5000 mg | ORAL_TABLET | Freq: Every day | ORAL | Status: DC
  Administered 2019-08-12 – 2019-08-15 (×4): 7.5 mg via ORAL
  Filled 2019-08-12 (×4): qty 2

## 2019-08-12 MED ORDER — WARFARIN SODIUM 7.5 MG PO TABS
7.5000 mg | ORAL_TABLET | Freq: Once | ORAL | Status: AC
  Administered 2019-08-12: 7.5 mg via ORAL
  Filled 2019-08-12: qty 1

## 2019-08-12 MED ORDER — ALBUTEROL SULFATE (2.5 MG/3ML) 0.083% IN NEBU: 3.0000 mL | INHALATION_SOLUTION | Freq: Two times a day (BID) | RESPIRATORY_TRACT | Status: DC | PRN

## 2019-08-12 MED ORDER — AMIODARONE HCL 200 MG PO TABS
200.0000 mg | ORAL_TABLET | Freq: Two times a day (BID) | ORAL | Status: DC
  Administered 2019-08-12 – 2019-08-15 (×6): 200 mg via ORAL
  Filled 2019-08-12 (×6): qty 1

## 2019-08-12 MED ORDER — INSULIN ASPART 100 UNIT/ML ~~LOC~~ SOLN
0.0000 [IU] | Freq: Three times a day (TID) | SUBCUTANEOUS | Status: DC
  Administered 2019-08-13: 2 [IU] via SUBCUTANEOUS
  Administered 2019-08-13: 3 [IU] via SUBCUTANEOUS
  Administered 2019-08-15: 2 [IU] via SUBCUTANEOUS

## 2019-08-12 MED ORDER — WARFARIN - PHARMACIST DOSING INPATIENT: Freq: Every day | Status: DC

## 2019-08-12 MED ORDER — GABAPENTIN 300 MG PO CAPS
300.0000 mg | ORAL_CAPSULE | Freq: Three times a day (TID) | ORAL | Status: DC
  Administered 2019-08-12 – 2019-08-15 (×8): 300 mg via ORAL
  Filled 2019-08-12 (×8): qty 1

## 2019-08-12 MED ORDER — ASPIRIN 81 MG PO CHEW
81.0000 mg | CHEWABLE_TABLET | Freq: Every day | ORAL | Status: DC
  Administered 2019-08-12 – 2019-08-14 (×3): 81 mg via ORAL
  Filled 2019-08-12 (×3): qty 1

## 2019-08-12 MED ORDER — TRAMADOL-ACETAMINOPHEN 37.5-325 MG PO TABS
1.0000 | ORAL_TABLET | Freq: Four times a day (QID) | ORAL | Status: DC | PRN
  Administered 2019-08-15: 1 via ORAL
  Filled 2019-08-12: qty 1

## 2019-08-12 MED ORDER — CARVEDILOL 3.125 MG PO TABS
3.1250 mg | ORAL_TABLET | Freq: Two times a day (BID) | ORAL | Status: DC
  Administered 2019-08-12 – 2019-08-15 (×6): 3.125 mg via ORAL
  Filled 2019-08-12 (×6): qty 1

## 2019-08-12 MED ORDER — SACUBITRIL-VALSARTAN 24-26 MG PO TABS
1.0000 | ORAL_TABLET | Freq: Two times a day (BID) | ORAL | Status: DC
  Administered 2019-08-12 – 2019-08-15 (×6): 1 via ORAL
  Filled 2019-08-12 (×6): qty 1

## 2019-08-12 NOTE — ED Notes (Signed)
Pt wife upset that pt was not given a menu. Pt and his wife were given sandwhich bags and drinks.

## 2019-08-12 NOTE — ED Notes (Signed)
Pacemaker interrogated. 
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
## 2019-08-12 NOTE — ED Triage Notes (Signed)
Pt here for evaluation of pacemaker misfiring. Pt endorses feeling lightheaded. Denies pain. Scheduled for an echo tomorrow.
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
## 2019-08-12 NOTE — Progress Notes (Signed)
Received report from ER nurse. Requested covid test be done before pt is brought to unit.

## 2019-08-12 NOTE — Progress Notes (Signed)
ANTICOAGULATION CONSULT NOTE - Initial Consult  Pharmacy Consult for warfarin Indication: atrial fibrillation  Allergies  Allergen Reactions  . Chocolate Hives, Shortness Of Breath and Swelling  . Statins Other (See Comments)    Mental changes, muscle aches  . Black Pepper [[place]] Other (See Comments)    Irritates back of throat  . Codeine Itching  . Oxytetracycline Other (See Comments)    Flushing in sunlight  . Tape Rash and Other (See Comments)    SKIN IS VERY SENSITIVE!!    Patient Measurements: Height: 5\' 9"  (175.3 cm) Weight: 274 lb (124.3 kg) IBW/kg (Calculated) : 70.7  Vital Signs: Temp: 97.7 F (36.5 C) (09/20 1346) Temp Source: Oral (09/20 1346) BP: 164/84 (09/20 1700) Pulse Rate: 65 (09/20 1700)  Labs: Recent Labs    08/12/19 1553  HGB 14.5  HCT 44.3  PLT 188  LABPROT 19.0*  INR 1.6*  CREATININE 1.26*    Estimated Creatinine Clearance: 69 mL/min (A) (by C-G formula based on SCr of 1.26 mg/dL (H)).   Medical History: Past Medical History:  Diagnosis Date  . Acute cystitis with hematuria 12/23/2016  . Allergy   . Arthritis    "knees, hands, lower back" (07/29/2016)  . Asthma    "touch q once & awhile" (02/05/2016)  . Cardiomyopathy, ischemic   . Carpal tunnel syndrome   . Cataract    left eye  . CHF (congestive heart failure) ([place]) 03/2018   chronic mixed  . Chronic bronchitis ([place])   . Chronic kidney disease (CKD), stage III (moderate) (HCC)   . Chronic venous insufficiency    with prior venous stasis ulcers x 1 2013  . Complication of anesthesia    "when coming out, I choke and get very restless if breathing tube is still in"  . Coronary artery disease    a. history of multiple stents to the LCx, LAD, and RCA b. s/p CABG in 08/2016 with LIMA-LAD, SVG-OM, SVG-PDA, and SVG-D1  . Diabetes mellitus, type II ([place])    type 2  . Dysrhythmia    atrial fib for once few yrs ago  . Elevated creatine kinase level 2018  . Exogenous obesity    severe   . Fatigue 12/2017   occ  . Hearing loss    Left ear  . Helicobacter pylori gastritis 2016  . History of blood transfusion ~ 2015   related to "when they went in to get my kidney stones"  . History of kidney stones   . Hx of colonic polyps 09/2006   inflammatory polyp at hepatic flexure. not adenomatous or malignant.   . Hyperlipidemia   . Hypertension   . Iron deficiency anemia   . LBBB (left bundle branch block)    He has developed a native LBBB which was seen on his last visit of March 2014 (From OV note 07/03/13)   . Left bundle branch block (LBBB)   . Long term (current) use of anticoagulants   . MI (myocardial infarction) ([place]) 1995   "mild"  . Nephrolithiasis    sees [place] [facility], sees every 4 months dr. Jimmy Footman ckd stage 3  . OSA on CPAP    "nasal CPAP" (07/29/2016) patient does not know settings   . Osteoarthritis, knee   . PAF (paroxysmal atrial fibrillation) ([place])    a. on Xarelto  . Pneumonia 03/2018  . 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 Jude Zephyr XL DR 5826, dual chamber, rate responsive. No arrhythmias recorded and  he has an excellent threshold.  . Rotator cuff tear last 2 years   right   . Sinus headache    occ  . SSS (sick sinus syndrome) ([place])   . Statin intolerance    Hx of. Now tolerating Zetia & Livalo well.   . Tachy-brady syndrome ([place])   . Unstable angina ([place]) 08/2016   mild, none recent   Assessment: 42 yom presented to the ED with near syncope. He is on chronic warfarin for history of afib. INR is subtherapeutic at 1.6. No bleeding noted.   PTA warfarin dose: 5mg  daily  Goal of Therapy:  INR 2-3 Monitor platelets by anticoagulation protocol: Yes   Plan:  Warfarin 7.5mg  PO x 1 tonight Daily INR  Kerby Hockley, Rande Lawman 08/12/2019,5:41 PM

## 2019-08-12 NOTE — Telephone Encounter (Signed)
   Patient called the after-hours line reporting dizziness, shortness of breath, and weakness starting this morning. Reports it feels like his prior episode of atrial fibrillation. He did check his vitals this morning and blood pressure was stable at 144/75 with heart rate at 62. He feels like his heart is racing though and is very irregular. He had already called EMS prior to calling the after-hours number and says they were on their way for evaluation. He called to make me aware because he is scheduled for an echocardiogram on 08/13/2019. Will forward today's note to scheduling making them aware that he may be in the ED or admitted tomorrow morning and to follow-up on this before listing him as a "no-show".  Signed, Erma Heritage, PA-C 08/12/2019, 12:05 PM Pager: [phone]

## 2019-08-13 ENCOUNTER — Other Ambulatory Visit (HOSPITAL_COMMUNITY): Payer: BC Managed Care – PPO

## 2019-08-13 ENCOUNTER — Encounter (HOSPITAL_COMMUNITY): Payer: Self-pay | Admitting: *Deleted

## 2019-08-13 ENCOUNTER — Observation Stay (HOSPITAL_BASED_OUTPATIENT_CLINIC_OR_DEPARTMENT_OTHER): Payer: BC Managed Care – PPO

## 2019-08-13 DIAGNOSIS — E785 Hyperlipidemia, unspecified: Secondary | ICD-10-CM | POA: Diagnosis present

## 2019-08-13 DIAGNOSIS — Z20828 Contact with and (suspected) exposure to other viral communicable diseases: Secondary | ICD-10-CM | POA: Diagnosis present

## 2019-08-13 DIAGNOSIS — I130 Hypertensive heart and chronic kidney disease with heart failure and stage 1 through stage 4 chronic kidney disease, or unspecified chronic kidney disease: Secondary | ICD-10-CM | POA: Diagnosis present

## 2019-08-13 DIAGNOSIS — M19042 Primary osteoarthritis, left hand: Secondary | ICD-10-CM | POA: Diagnosis present

## 2019-08-13 DIAGNOSIS — M479 Spondylosis, unspecified: Secondary | ICD-10-CM | POA: Diagnosis present

## 2019-08-13 DIAGNOSIS — E1122 Type 2 diabetes mellitus with diabetic chronic kidney disease: Secondary | ICD-10-CM | POA: Diagnosis present

## 2019-08-13 DIAGNOSIS — Z7901 Long term (current) use of anticoagulants: Secondary | ICD-10-CM | POA: Diagnosis not present

## 2019-08-13 DIAGNOSIS — I255 Ischemic cardiomyopathy: Secondary | ICD-10-CM | POA: Diagnosis present

## 2019-08-13 DIAGNOSIS — N183 Chronic kidney disease, stage 3 (moderate): Secondary | ICD-10-CM | POA: Diagnosis present

## 2019-08-13 DIAGNOSIS — M170 Bilateral primary osteoarthritis of knee: Secondary | ICD-10-CM | POA: Diagnosis present

## 2019-08-13 DIAGNOSIS — I447 Left bundle-branch block, unspecified: Secondary | ICD-10-CM | POA: Diagnosis present

## 2019-08-13 DIAGNOSIS — I2510 Atherosclerotic heart disease of native coronary artery without angina pectoris: Secondary | ICD-10-CM | POA: Diagnosis present

## 2019-08-13 DIAGNOSIS — Z794 Long term (current) use of insulin: Secondary | ICD-10-CM | POA: Diagnosis not present

## 2019-08-13 DIAGNOSIS — I509 Heart failure, unspecified: Secondary | ICD-10-CM | POA: Diagnosis not present

## 2019-08-13 DIAGNOSIS — Z951 Presence of aortocoronary bypass graft: Secondary | ICD-10-CM | POA: Diagnosis not present

## 2019-08-13 DIAGNOSIS — R55 Syncope and collapse: Secondary | ICD-10-CM

## 2019-08-13 DIAGNOSIS — I495 Sick sinus syndrome: Secondary | ICD-10-CM | POA: Diagnosis present

## 2019-08-13 DIAGNOSIS — G4733 Obstructive sleep apnea (adult) (pediatric): Secondary | ICD-10-CM | POA: Diagnosis present

## 2019-08-13 DIAGNOSIS — Z6841 Body Mass Index (BMI) 40.0 and over, adult: Secondary | ICD-10-CM | POA: Diagnosis not present

## 2019-08-13 DIAGNOSIS — E1142 Type 2 diabetes mellitus with diabetic polyneuropathy: Secondary | ICD-10-CM | POA: Diagnosis present

## 2019-08-13 DIAGNOSIS — M19041 Primary osteoarthritis, right hand: Secondary | ICD-10-CM | POA: Diagnosis present

## 2019-08-13 DIAGNOSIS — I5022 Chronic systolic (congestive) heart failure: Secondary | ICD-10-CM | POA: Diagnosis present

## 2019-08-13 DIAGNOSIS — Z45010 Encounter for checking and testing of cardiac pacemaker pulse generator [battery]: Secondary | ICD-10-CM | POA: Diagnosis not present

## 2019-08-13 DIAGNOSIS — I519 Heart disease, unspecified: Secondary | ICD-10-CM | POA: Diagnosis not present

## 2019-08-13 DIAGNOSIS — G894 Chronic pain syndrome: Secondary | ICD-10-CM | POA: Diagnosis present

## 2019-08-13 DIAGNOSIS — I252 Old myocardial infarction: Secondary | ICD-10-CM | POA: Diagnosis not present

## 2019-08-13 DIAGNOSIS — I480 Paroxysmal atrial fibrillation: Secondary | ICD-10-CM | POA: Diagnosis present

## 2019-08-13 DIAGNOSIS — Z7982 Long term (current) use of aspirin: Secondary | ICD-10-CM | POA: Diagnosis not present

## 2019-08-13 LAB — ECHOCARDIOGRAM COMPLETE
Height: 69 in
Weight: 4366.4 oz

## 2019-08-13 LAB — GLUCOSE, CAPILLARY
Glucose-Capillary: 108 mg/dL — ABNORMAL HIGH (ref 70–99)
Glucose-Capillary: 175 mg/dL — ABNORMAL HIGH (ref 70–99)
Glucose-Capillary: 146 mg/dL — ABNORMAL HIGH (ref 70–99)
Glucose-Capillary: 160 mg/dL — ABNORMAL HIGH (ref 70–99)

## 2019-08-13 LAB — PROTIME-INR
INR: 1.9 — ABNORMAL HIGH (ref 0.8–1.2)
Prothrombin Time: 21.5 seconds — ABNORMAL HIGH (ref 11.4–15.2)

## 2019-08-13 LAB — SURGICAL PCR SCREEN
MRSA, PCR: NEGATIVE
Staphylococcus aureus: POSITIVE — AB

## 2019-08-13 MED ORDER — CHLORHEXIDINE GLUCONATE 4 % EX LIQD
60.0000 mL | Freq: Once | CUTANEOUS | Status: AC
  Administered 2019-08-13: 4 via TOPICAL

## 2019-08-13 MED ORDER — POLYETHYLENE GLYCOL 3350 17 G PO PACK
17.0000 g | PACK | Freq: Every day | ORAL | Status: DC | PRN
  Administered 2019-08-13 – 2019-08-14 (×2): 17 g via ORAL
  Filled 2019-08-13: qty 1

## 2019-08-13 MED ORDER — WARFARIN SODIUM 7.5 MG PO TABS: 7.5000 mg | ORAL_TABLET | Freq: Once | ORAL | Status: DC

## 2019-08-13 MED ORDER — WARFARIN SODIUM 5 MG PO TABS: 5.0000 mg | ORAL_TABLET | Freq: Once | ORAL | Status: DC

## 2019-08-13 NOTE — Progress Notes (Addendum)
[place] for warfarin Indication: atrial fibrillation  Allergies  Allergen Reactions  . Chocolate Hives, Shortness Of Breath and Swelling  . Statins Other (See Comments)    Mental changes, muscle aches  . Black Pepper [[place]] Other (See Comments)    Irritates back of throat  . Codeine Itching  . Latex Itching and Other (See Comments)    Sensitive skin  . Oxytetracycline Other (See Comments)    Flushing in sunlight  . Tape Rash and Other (See Comments)    SKIN IS VERY SENSITIVE!!    Patient Measurements: Height: 5\' 9"  (175.3 cm) Weight: 272 lb 14.4 oz (123.8 kg) IBW/kg (Calculated) : 70.7  Vital Signs: Temp: 97.8 F (36.6 C) (09/21 1133) Temp Source: Oral (09/21 1133) BP: 144/82 (09/21 1133) Pulse Rate: 62 (09/21 1133)  Labs: Recent Labs    08/12/19 1553 08/13/19 0537  HGB 14.5  --   HCT 44.3  --   PLT 188  --   LABPROT 19.0* 21.5*  INR 1.6* 1.9*  CREATININE 1.26*  --     Estimated Creatinine Clearance: 68.9 mL/min (A) (by C-G formula based on SCr of 1.26 mg/dL (H)).   Medical History: Past Medical History:  Diagnosis Date  . Acute cystitis with hematuria 12/23/2016  . Allergy   . Arthritis    "knees, hands, lower back" (07/29/2016)  . Asthma    "touch q once & awhile" (02/05/2016)  . Cardiomyopathy, ischemic   . Carpal tunnel syndrome   . Cataract    left eye  . CHF (congestive heart failure) ([place]) 03/2018   chronic mixed  . Chronic bronchitis ([place])   . Chronic kidney disease (CKD), stage III (moderate) (HCC)   . Chronic venous insufficiency    with prior venous stasis ulcers x 1 2013  . Complication of anesthesia    "when coming out, I choke and get very restless if breathing tube is still in"  . Coronary artery disease    a. history of multiple stents to the LCx, LAD, and RCA b. s/p CABG in 08/2016 with LIMA-LAD, SVG-OM, SVG-PDA, and SVG-D1  . Diabetes mellitus, type II ([place])    type 2  . Dysrhythmia     atrial fib for once few yrs ago  . Elevated creatine kinase level 2018  . Exogenous obesity    severe  . Fatigue 12/2017   occ  . Hearing loss    Left ear  . Helicobacter pylori gastritis 2016  . History of blood transfusion ~ 2015   related to "when they went in to get my kidney stones"  . History of kidney stones   . Hx of colonic polyps 09/2006   inflammatory polyp at hepatic flexure. not adenomatous or malignant.   . Hyperlipidemia   . Hypertension   . Iron deficiency anemia   . LBBB (left bundle branch block)    He has developed a native LBBB which was seen on his last visit of March 2014 (From OV note 07/03/13)   . Left bundle branch block (LBBB)   . Long term (current) use of anticoagulants   . MI (myocardial infarction) ([place]) 1995   "mild"  . Nephrolithiasis    sees [facility], sees every 4 months dr. Jimmy Footman ckd stage 3  . OSA on CPAP    "nasal CPAP" (07/29/2016) patient does not know settings   . Osteoarthritis, knee   . PAF (paroxysmal atrial fibrillation) ([place])    a. on  Xarelto  . Pneumonia 03/2018  . 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 Jude Zephyr XL DR 5826, dual chamber, rate responsive. No arrhythmias recorded and he has an excellent threshold.  . Rotator cuff tear last 2 years   right   . Sinus headache    occ  . SSS (sick sinus syndrome) ([place])   . Statin intolerance    Hx of. Now tolerating Zetia & Livalo well.   . Tachy-brady syndrome ([place])   . Unstable angina ([place]) 08/2016   mild, none recent   Assessment: 15 yom presented to the ED with near syncope. He is on chronic warfarin for history of afib. INR is subtherapeutic at 1.9, but rising. No bleeding noted.   PTA warfarin dose: 5mg  daily  Goal of Therapy:  INR 2-3 Monitor platelets by anticoagulation protocol: Yes   Plan:  Spoke to Tommye Standard, PA  from EP, will hold warfarin tonight in anticipation of pacer upgrade tomorrow. F/u after procedure about when to  resume.  Marguerite Olea, [facility] Clinical Pharmacist Phone [phone]  08/13/2019 1:28 PM

## 2019-08-13 NOTE — Progress Notes (Signed)
  Echocardiogram 2D Echocardiogram has been performed.  Charles [name] 08/13/2019, 10:31 AM
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
## 2019-08-13 NOTE — Plan of Care (Signed)
  Problem: Education: Goal: Knowledge of General Education information will improve Description: Including pain rating scale, medication(s)/side effects and non-pharmacologic comfort measures 08/13/2019 0034 by Mickie Kay, RN Outcome: Progressing 08/13/2019 0028 by Mickie Kay, RN Outcome: Progressing   Problem: Health Behavior/Discharge Planning: Goal: Ability to manage health-related needs will improve 08/13/2019 0034 by Mickie Kay, RN Outcome: Progressing 08/13/2019 0028 by Mickie Kay, RN Outcome: Progressing   Problem: Clinical Measurements: Goal: Ability to maintain clinical measurements within normal limits will improve 08/13/2019 0034 by Mickie Kay, RN Outcome: Progressing 08/13/2019 0028 by Mickie Kay, RN Outcome: Progressing Goal: Will remain free from infection 08/13/2019 0034 by Mickie Kay, RN Outcome: Progressing 08/13/2019 0028 by Mickie Kay, RN Outcome: Progressing Goal: Diagnostic test results will improve 08/13/2019 0034 by Mickie Kay, RN Outcome: Progressing 08/13/2019 0028 by Mickie Kay, RN Outcome: Progressing Goal: Respiratory complications will improve 08/13/2019 0034 by Mickie Kay, RN Outcome: Progressing 08/13/2019 0028 by Mickie Kay, RN Outcome: Progressing Goal: Cardiovascular complication will be avoided 08/13/2019 0034 by Mickie Kay, RN Outcome: Progressing 08/13/2019 0028 by Mickie Kay, RN Outcome: Progressing   Problem: Activity: Goal: Risk for activity intolerance will decrease 08/13/2019 0034 by Mickie Kay, RN Outcome: Progressing 08/13/2019 0028 by Mickie Kay, RN Outcome: Progressing   Problem: Nutrition: Goal: Adequate nutrition will be maintained 08/13/2019 0034 by Mickie Kay, RN Outcome: Progressing 08/13/2019 0028 by Mickie Kay, RN Outcome: Progressing   Problem: Coping: Goal: Level of anxiety will decrease 08/13/2019 0034 by Mickie Kay, RN Outcome: Progressing 08/13/2019 0028 by Mickie Kay, RN Outcome: Progressing   Problem: Elimination: Goal: Will not experience complications related to bowel motility 08/13/2019 0034 by Mickie Kay, RN Outcome: Progressing 08/13/2019 0028 by Mickie Kay, RN Outcome: Progressing Goal: Will not experience complications related to urinary retention 08/13/2019 0034 by Mickie Kay, RN Outcome: Progressing 08/13/2019 0028 by Mickie Kay, RN Outcome: Progressing   Problem: Pain Managment: Goal: General experience of comfort will improve 08/13/2019 0034 by Mickie Kay, RN Outcome: Progressing 08/13/2019 0028 by Mickie Kay, RN Outcome: Progressing   Problem: Safety: Goal: Ability to remain free from injury will improve 08/13/2019 0034 by Mickie Kay, RN Outcome: Progressing 08/13/2019 0028 by Mickie Kay, RN Outcome: Progressing   Problem: Skin Integrity: Goal: Risk for impaired skin integrity will decrease 08/13/2019 0034 by Mickie Kay, RN Outcome: Progressing 08/13/2019 0028 by Mickie Kay, RN Outcome: Progressing   Problem: Education: Goal: Ability to demonstrate management of disease process will improve 08/13/2019 0034 by Mickie Kay, RN Outcome: Progressing 08/13/2019 0028 by Mickie Kay, RN Outcome: Progressing Goal: Ability to verbalize understanding of medication therapies will improve 08/13/2019 0034 by Mickie Kay, RN Outcome: Progressing 08/13/2019 0028 by Mickie Kay, RN Outcome: Progressing Goal: Individualized Educational Video(s) 08/13/2019 0034 by Mickie Kay, RN Outcome: Progressing 08/13/2019 0028 by Mickie Kay, RN Outcome: Progressing   Problem: Activity: Goal: Capacity to carry out activities will improve 08/13/2019 0034 by Mickie Kay, RN Outcome: Progressing 08/13/2019 0028 by Mickie Kay, RN Outcome: Progressing   Problem: Cardiac: Goal: Ability to  achieve and maintain adequate cardiopulmonary perfusion will improve 08/13/2019 0034 by Mickie Kay, RN Outcome: Progressing 08/13/2019 0028 by Mickie Kay, RN Outcome: Progressing

## 2019-08-13 NOTE — Telephone Encounter (Signed)
Pt scheduled for procedure during current hospitalization.  No further action necessary.

## 2019-08-13 NOTE — Plan of Care (Signed)

## 2019-08-13 NOTE — Progress Notes (Addendum)
Progress Note  Patient Name: Charles [name] Date of Encounter: 08/13/2019  Primary Cardiologist: Shelva Majestic, MD  EP: Dr. [name]  Subjective   Continues to feel better  Inpatient Medications    Scheduled Meds: . alfuzosin  10 mg Oral Q breakfast  . amiodarone  200 mg Oral BID  . aspirin  81 mg Oral Daily  . carvedilol  3.125 mg Oral BID WC  . gabapentin  300 mg Oral TID  . insulin aspart  0-15 Units Subcutaneous TID WC  . insulin glargine  42 Units Subcutaneous Daily  . rosuvastatin  7.5 mg Oral Daily  . sacubitril-valsartan  1 tablet Oral BID  . Warfarin - Pharmacist Dosing Inpatient   Does not apply q1800   Continuous Infusions:  PRN Meds: albuterol, traMADol-acetaminophen   Vital Signs    Vitals:   08/12/19 1930 08/12/19 2021 08/12/19 2332 08/13/19 0441  BP: (!) 149/71 (!) 168/79 (!) 153/78 (!) 141/71  Pulse: 62 61 61 61  Resp: 13 16 17 17   Temp:  97.6 F (36.4 C) 98.2 F (36.8 C) 97.8 F (36.6 C)  TempSrc:  Oral Oral Oral  SpO2: 96% 98% 97% 97%  Weight:    123.8 kg  Height:        Intake/Output Summary (Last 24 hours) at 08/13/2019 0852 Last data filed at 08/13/2019 0805 Gross per 24 hour  Intake 600 ml  Output 1200 ml  Net -600 ml   Last 3 Weights 08/13/2019 08/12/2019 08/10/2019  Weight (lbs) 272 lb 14.4 oz 274 lb 275 lb 6.4 oz  Weight (kg) 123.787 kg 124.286 kg 124.921 kg      Telemetry    AP, V sensed - Personally Reviewed  ECG    No new EKGs - Personally Reviewed  Physical Exam   GEN: No acute distress.   Neck: No JVD Cardiac: RRR, no murmurs, rubs, or gallops.  Respiratory: CTA b/l. GI: Soft, nontender, non-distended  MS: No edema; No deformity. Neuro:  Nonfocal  Psych: Normal affect   Labs    High Sensitivity Troponin:   Recent Labs  Lab 07/30/19 1151 07/30/19 1405  TROPONINIHS 14 15      Chemistry Recent Labs  Lab 08/12/19 1553  NA 139  K 4.7  CL 106  CO2 26  GLUCOSE 125*  BUN 19  CREATININE 1.26*   CALCIUM 8.7*  GFRNONAA 57*  GFRAA >60  ANIONGAP 7     Hematology Recent Labs  Lab 08/12/19 1553  WBC 7.5  RBC 4.68  HGB 14.5  HCT 44.3  MCV 94.7  MCH 31.0  MCHC 32.7  RDW 13.9  PLT 188    BNPNo results for input(s): BNP, PROBNP in the last 168 hours.   DDimer No results for input(s): DDIMER in the last 168 hours.   Radiology    Dg Chest Portable 1 View Result Date: 08/12/2019 CLINICAL DATA:  Pacemaker misfiring, lightheadedness EXAM: PORTABLE CHEST 1 VIEW COMPARISON:  07/30/2019 FINDINGS: Cardiomegaly status post median sternotomy and CABG with left chest multi lead pacer. Aortic atherosclerosis. Both lungs are clear. The visualized skeletal structures are unremarkable. IMPRESSION: Cardiomegaly without acute abnormality of the lungs in AP portable projection. Electronically Signed   By: Eddie Candle M.D.   On: 08/12/2019 16:12    Cardiac Studies   Echo is ordered  04/01/18: TTE Study Conclusions - Left ventricle: Septal and apical akinesis inferior wall   hypokinesis. The cavity size was moderately dilated. Wall   thickness was  increased in a pattern of moderate LVH. The   estimated ejection fraction was 30%. Left ventricular diastolic   function parameters were normal. - Aortic valve: Valve area (Vmax): 2.2 cm^2. - Mitral valve: Calcified annulus. Mildly thickened leaflets .   There was moderate regurgitation. - Left atrium: The atrium was moderately dilated. - Atrial septum: No defect or patent foramen ovale was identified.   2017 TTE with LVEF 45-50%, severe LVH   Patient Profile     73 y.o. male w/PMHx of CAD (CABG 2017), chronic CHF (mixed), Paroxysmal AFib, SSSx w/PPM, DM, OSA, HTN, HLD, CKD (III), chronic venous insufficiency, GERD admitted to [facility] yesterday with new onset palpitations, SOB found in AFib  Assessment & Plan    1. Paroxysmal AFib     CHA2DS2VAsc is 4, on warfarin     INR today is 1.9 (yesterday 1.6)     Had DCCV on 07/30/2019 started on  amio then     Symptomatic with his AFib, decreased exertional capacity, fatigue, SOB, lightheaded.     Since in his room has maintained SR/(A paced) he does not feel completely back to his "best" but can tell the difference)       Resumed on 200mg  BID amiodarone   NO WARFAIN TONIGHT PLEASE, pending procedure tomorrow with INR trending upwards   2. PPM     Known to have reached ERI, on 12/31/2018, VP <1%     was planned for echo to decide about possible CRT upgrade     Pending his echo for device decisions   Echo is done, pending read, will plan for generator change (possible upgrade of device pending echo findings) tomorrow with Dr. [name]   3. CAD     No anginal complaints     Just saw cards team this month, felt to be doing very well   4. ICM 4. LBBB     Not overtly volume OL, minimal edema, lings clear     Echo read is pending   Dr. Lovena Le will see later today.       For questions or updates, please contact [place] Please consult [email or web address] for contact info under        Signed, Baldwin Jamaica, PA-C  08/13/2019, 8:52 AM    EP Attending  Patient seen and examined. Agree with the findings as noted above. The patient is pending a 2D echo and will undergo biv PPM upgrade tomorrow followed by uptitration of his AA drug therapy for the prevention of atrial fib.  Mikle Bosworth.D.

## 2019-08-13 NOTE — Telephone Encounter (Signed)
Outreach  Made to Pt to schedule upgrade.  Spoke with wife.  Pt is currently hospitalized.  Sent message to EP APP to advise nurse if Pt having procedure done while inpatient vs being discharged and trying to schedule outpatient

## 2019-08-14 ENCOUNTER — Other Ambulatory Visit: Payer: Self-pay

## 2019-08-14 ENCOUNTER — Encounter (HOSPITAL_COMMUNITY)
Admission: EM | Disposition: A | Discharge status: Home / Self Care | Payer: Self-pay | Source: Home / Self Care | Attending: Internal Medicine

## 2019-08-14 DIAGNOSIS — Z45010 Encounter for checking and testing of cardiac pacemaker pulse generator [battery]: Secondary | ICD-10-CM

## 2019-08-14 DIAGNOSIS — I495 Sick sinus syndrome: Secondary | ICD-10-CM

## 2019-08-14 DIAGNOSIS — I509 Heart failure, unspecified: Secondary | ICD-10-CM

## 2019-08-14 HISTORY — PX: BIV PACEMAKER INSERTION CRT-P: EP1199

## 2019-08-14 LAB — GLUCOSE, CAPILLARY
Glucose-Capillary: 122 mg/dL — ABNORMAL HIGH (ref 70–99)
Glucose-Capillary: 100 mg/dL — ABNORMAL HIGH (ref 70–99)
Glucose-Capillary: 75 mg/dL (ref 70–99)
Glucose-Capillary: 101 mg/dL — ABNORMAL HIGH (ref 70–99)
Glucose-Capillary: 298 mg/dL — ABNORMAL HIGH (ref 70–99)

## 2019-08-14 LAB — PROTIME-INR
INR: 2 — ABNORMAL HIGH (ref 0.8–1.2)
Prothrombin Time: 22.8 seconds — ABNORMAL HIGH (ref 11.4–15.2)

## 2019-08-14 SURGERY — BIV PACEMAKER INSERTION CRT-P

## 2019-08-14 MED ORDER — SODIUM CHLORIDE 0.9% FLUSH
3.0000 mL | INTRAVENOUS | Status: DC | PRN
  Administered 2019-08-15: 3 mL via INTRAVENOUS
  Filled 2019-08-14: qty 3

## 2019-08-14 MED ORDER — FENTANYL CITRATE (PF) 100 MCG/2ML IJ SOLN
INTRAMUSCULAR | Status: AC
  Filled 2019-08-14: qty 2

## 2019-08-14 MED ORDER — ONDANSETRON HCL 4 MG/2ML IJ SOLN: 4.0000 mg | Freq: Four times a day (QID) | INTRAMUSCULAR | Status: DC | PRN

## 2019-08-14 MED ORDER — SODIUM CHLORIDE 0.9 % IV SOLN: 250.0000 mL | INTRAVENOUS | Status: DC | PRN

## 2019-08-14 MED ORDER — CHLORHEXIDINE GLUCONATE CLOTH 2 % EX PADS
6.0000 | MEDICATED_PAD | Freq: Every day | CUTANEOUS | Status: DC
  Administered 2019-08-14: 6 via TOPICAL

## 2019-08-14 MED ORDER — CEFAZOLIN SODIUM-DEXTROSE 1-4 GM/50ML-% IV SOLN
1.0000 g | Freq: Four times a day (QID) | INTRAVENOUS | Status: AC
  Administered 2019-08-14 – 2019-08-15 (×3): 1 g via INTRAVENOUS
  Filled 2019-08-14 (×4): qty 50

## 2019-08-14 MED ORDER — MIDAZOLAM HCL 5 MG/5ML IJ SOLN
INTRAMUSCULAR | Status: AC
  Filled 2019-08-14: qty 5

## 2019-08-14 MED ORDER — IOHEXOL 350 MG/ML SOLN
INTRAVENOUS | Status: DC | PRN
  Administered 2019-08-14: 5 mL
  Administered 2019-08-14: 16:00:00 15 mL

## 2019-08-14 MED ORDER — MIDAZOLAM HCL 5 MG/5ML IJ SOLN
INTRAMUSCULAR | Status: DC | PRN
  Administered 2019-08-14 (×3): 1 mg via INTRAVENOUS

## 2019-08-14 MED ORDER — SODIUM CHLORIDE 0.9% FLUSH
3.0000 mL | Freq: Two times a day (BID) | INTRAVENOUS | Status: DC
  Administered 2019-08-15: 3 mL via INTRAVENOUS

## 2019-08-14 MED ORDER — DEXTROSE 5 % IV SOLN
3.0000 g | INTRAVENOUS | Status: AC
  Administered 2019-08-14: 3 g via INTRAVENOUS
  Filled 2019-08-14 (×3): qty 3000

## 2019-08-14 MED ORDER — HEPARIN (PORCINE) IN NACL 1000-0.9 UT/500ML-% IV SOLN
INTRAVENOUS | Status: AC
  Filled 2019-08-14: qty 500

## 2019-08-14 MED ORDER — MUPIROCIN 2 % EX OINT
1.0000 "application " | TOPICAL_OINTMENT | Freq: Two times a day (BID) | CUTANEOUS | Status: DC
  Administered 2019-08-14 (×3): 1 via NASAL
  Filled 2019-08-14: qty 22

## 2019-08-14 MED ORDER — LIDOCAINE HCL (PF) 1 % IJ SOLN
INTRAMUSCULAR | Status: AC
  Filled 2019-08-14: qty 60

## 2019-08-14 MED ORDER — LIDOCAINE HCL (PF) 1 % IJ SOLN
INTRAMUSCULAR | Status: DC | PRN
  Administered 2019-08-14: 40 mL

## 2019-08-14 MED ORDER — SODIUM CHLORIDE 0.9 % IV SOLN
80.0000 mg | INTRAVENOUS | Status: AC
  Administered 2019-08-14: 80 mg
  Filled 2019-08-14: qty 2

## 2019-08-14 MED ORDER — ACETAMINOPHEN 325 MG PO TABS
325.0000 mg | ORAL_TABLET | ORAL | Status: DC | PRN
  Administered 2019-08-14: 650 mg via ORAL
  Filled 2019-08-14: qty 2

## 2019-08-14 MED ORDER — SODIUM CHLORIDE 0.9 % IV SOLN: 250.0000 mL | INTRAVENOUS | Status: DC

## 2019-08-14 MED ORDER — SODIUM CHLORIDE 0.9 % IV SOLN
INTRAVENOUS | Status: AC
  Filled 2019-08-14: qty 2

## 2019-08-14 MED ORDER — FENTANYL CITRATE (PF) 100 MCG/2ML IJ SOLN
INTRAMUSCULAR | Status: DC | PRN
  Administered 2019-08-14: 12.5 ug via INTRAVENOUS
  Administered 2019-08-14: 25 ug via INTRAVENOUS
  Administered 2019-08-14 (×3): 12.5 ug via INTRAVENOUS

## 2019-08-14 MED ORDER — SODIUM CHLORIDE 0.9 % IV SOLN
INTRAVENOUS | Status: DC
  Administered 2019-08-14: 01:00:00 via INTRAVENOUS

## 2019-08-14 MED ORDER — HEPARIN (PORCINE) IN NACL 1000-0.9 UT/500ML-% IV SOLN
INTRAVENOUS | Status: DC | PRN
  Administered 2019-08-14: 500 mL

## 2019-08-14 MED ORDER — SODIUM CHLORIDE 0.9% FLUSH: 3.0000 mL | INTRAVENOUS | Status: DC | PRN

## 2019-08-14 MED ORDER — CHLORHEXIDINE GLUCONATE 4 % EX LIQD: 60.0000 mL | Freq: Once | CUTANEOUS | Status: DC

## 2019-08-14 MED ORDER — HYDROCODONE-ACETAMINOPHEN 5-325 MG PO TABS
1.0000 | ORAL_TABLET | ORAL | Status: DC | PRN
  Administered 2019-08-15: 1 via ORAL
  Filled 2019-08-14: qty 1
  Filled 2019-08-14: qty 2

## 2019-08-14 MED ORDER — SODIUM CHLORIDE 0.9% FLUSH: 3.0000 mL | Freq: Two times a day (BID) | INTRAVENOUS | Status: DC

## 2019-08-14 SURGICAL SUPPLY — 19 items
ADAPTER SEALING SSA-EW-09 (MISCELLANEOUS) ×2 IMPLANT
CABLE SURGICAL S-101-97-12 (CABLE) ×2 IMPLANT
CATH ATTAIN COM SURV 6250V-MB2 (CATHETERS) ×2 IMPLANT
CATH ATTAIN SEL SURV 6248V-130 (CATHETERS) ×2 IMPLANT
CATH HEX JOSEPH 2-5-2 65CM 6F (CATHETERS) ×2 IMPLANT
GUIDEWIRE ANGLED .035X150CM (WIRE) ×2 IMPLANT
KIT ESSENTIALS PG (KITS) ×2 IMPLANT
KIT MICROPUNCTURE NIT STIFF (SHEATH) ×2 IMPLANT
LEAD QUARTET 1456Q-86 (Lead) ×1 IMPLANT
PACEMAKER QUDR ALLR CRT PM3562 (Pacemaker) ×1 IMPLANT
PAD PRO RADIOLUCENT 2001M-C (PAD) ×2 IMPLANT
PMKR QUADRA ALLURE CRT PM3562 (Pacemaker) ×2 IMPLANT
QUARTET 1456Q-86 (Lead) ×2 IMPLANT
SHEATH 9.5FR PRELUDE SNAP 13 (SHEATH) ×2 IMPLANT
SHEATH PINNACLE 6F 10CM (SHEATH) ×2 IMPLANT
SLITTER 6232ADJ (MISCELLANEOUS) ×2 IMPLANT
TRAY PACEMAKER INSERTION (PACKS) ×2 IMPLANT
WIRE ACUITY WHISPER EDS 4648 (WIRE) ×6 IMPLANT
WIRE MAILMAN 182CM (WIRE) ×2 IMPLANT

## 2019-08-14 NOTE — Interval H&P Note (Signed)
History and Physical Interval Note:  08/14/2019 12:26 PM  Charles [name]  has presented today for surgery, with the diagnosis of battery deplition - cardiomyopathy.  The various methods of treatment have been discussed with the patient and family. After consideration of risks, benefits and other options for treatment, the patient has consented to  Procedure(s): BIV PACEMAKER INSERTION CRT-P (N/A) as a surgical intervention.  The patient's history has been reviewed, patient examined, no change in status, stable for surgery.  I have reviewed the patient's chart and labs.  Questions were answered to the patient's satisfaction.     Thompson Grayer

## 2019-08-14 NOTE — Discharge Summary (Addendum)
DISCHARGE SUMMARY    Patient ID: Charles [name],  MRN: [id number], DOB/AGE: 1946-05-01 73 y.o.  Admit date: 08/12/2019 Discharge date: 08/15/2019  Primary Care Physician: Cyndi Bender, PA-C  Primary Cardiologist: Dr. Claiborne Billings Electrophysiologist: Dr. [name]  Primary Discharge Diagnosis:  1. Paroxysmal Afib     CHA2DS2Vasc is 4, on warfarin 2. Tachy-brady, PPM battery at [facility]  Secondary Discharge Diagnosis:  1. CAD     h/o CABG 2. ICM 3. LBBB 4. HTN   Allergies  Allergen Reactions  . Chocolate Hives, Shortness Of Breath and Swelling  . Statins Other (See Comments)    Mental changes, muscle aches  . Black Pepper [[place]] Other (See Comments)    Irritates back of throat  . Codeine Itching  . Latex Itching and Other (See Comments)    Sensitive skin  . Oxytetracycline Other (See Comments)    Flushing in sunlight  . Tape Rash and Other (See Comments)    SKIN IS VERY SENSITIVE!!     Procedures This Admission:  1.  Implantation/upgrade of a dual chamber PPM to CRT-P on 08/14/2019 by Dr [name].  The patient received a [employer]ist model [phone] (serial number [id number]) lead, [place] MP model [id number] (serial  Number [id number]) biventricular pacemaker There were no immediate post procedure complications. 2.  CXR on 08/15/2019 demonstrated no pneumothorax status post device implantation.   Brief HPI: Charles [name] is a 73 y.o. male sought medical attention 08/12/2019 with palpitations associated with SOB and dizziness/near syncope, he was found in AFib in the ER with intermittent SR as well.  He was admitted for further management.   Hospital Course:  The patient was admitted his home amiodarone was up titrated to 200mg  BID.  It was noted outpatient that his PPM had reached ERI, and was pending an echo to re-evaluate his LVEF and decide if device upgrade was warranted given known ICM and LBBB.  TTE was done noted his LVEF 35-40% and  planned to proceed with PPM upgrade to CRT-P.  He maintained SR/A paced rhythm with PACs that he was aware of.  He did not have any exam findings or CXR findings of fluid OL. He underwent upgrade of his PPM to CRT-P, full details are in his procedure report.  He  was monitored on telemetry throughout his stay, maintaining SR since his admission (A paced rhythm), overnight post procedure noted AV pacing .  Left chest had small-mod sized hematoma.  New pressure dressing was applied and will have early follow up in 2 days for re-evaluation in the office.  The device was interrogated and found to be functioning normally.  CXR was obtained and demonstrated no pneumothorax status post device implantation.  Wound care, arm mobility, and restrictions were reviewed with the patient.  The patient feels well, no CP or SOB, he was examined by Dr. [name] and considered stable for discharge to home.   The patient mentioned his INRs have been historically pretty labile with need for dosing adjustments, case management was asked to evaluate Xarelto financially, he will have no copay, no prior auth required.  We will stop warfarin and have his start the Xarelto On Sunday if site stable at his check on Friday.  I have asked him to hold his ASA until his visit Friday and cleared to resume.  Will have him remain on BID amiodarone for 1 week then resume daily dosing.   Physical Exam: Vitals:  08/14/19 1758 08/15/19 0054 08/15/19 0204 08/15/19 0411  BP: (!) 153/95 131/81  122/85  Pulse: 76 76  74  Resp: 14 18  18   Temp:  98.1 F (36.7 C)  97.6 F (36.4 C)  TempSrc:  Oral  Oral  SpO2:  97%  99%  Weight:   125.4 kg   Height:         GEN- The patient is well appearing, alert and oriented x 3 today.   HEENT: normocephalic, atraumatic; sclera clear, conjunctiva pink; hearing intact; oropharynx clear; neck supple, no JVP Lungs- CTA b/l, normal work of breathing.  No wheezes, rales, rhonchi Heart- RRR, no murmurs,  rubs or gallops, PMI not laterally displaced GI- soft, non-tender, non-distended, bowel sounds present Extremities- no clubbing, cyanosis, or edema MS- no significant deformity or atrophy Skin- warm and dry, no rash or lesion,  left chest small-mod hematoma, more so medially, small area of ecchymosis laterally Psych- euthymic mood, full affect Neuro- no gross deficits   Labs:   Lab Results  Component Value Date   WBC 7.5 08/12/2019   HGB 14.5 08/12/2019   HCT 44.3 08/12/2019   MCV 94.7 08/12/2019   PLT 188 08/12/2019    Recent Labs  Lab 08/15/19 0423  NA 138  K 4.0  CL 105  CO2 25  BUN 19  CREATININE 1.36*  CALCIUM 8.5*  GLUCOSE 151*    Discharge Medications:  Allergies as of 08/15/2019      Reactions   Chocolate Hives, Shortness Of Breath, Swelling   Statins Other (See Comments)   Mental changes, muscle aches   Black Pepper [piper] Other (See Comments)   Irritates back of throat   Codeine Itching   Latex Itching, Other (See Comments)   Sensitive skin   Oxytetracycline Other (See Comments)   Flushing in sunlight   Tape Rash, Other (See Comments)   SKIN IS VERY SENSITIVE!!      Medication List    STOP taking these medications   warfarin 5 MG tablet Commonly known as: COUMADIN     TAKE these medications   alfuzosin 10 MG 24 hr tablet Commonly known as: UROXATRAL Take 10 mg by mouth every evening.   amiodarone 200 MG tablet Commonly known as: Pacerone Take 1 tablet (200 mg total) by mouth daily for 14 days. What changed: when to take this Notes to patient: Take twice daily for 7 days THEN back to once daily as we discussed   aspirin 81 MG EC tablet Take 1 tablet (81 mg total) by mouth daily. What changed: when to take this Notes to patient: Hold until your wound check visit Friday, and cleared to restart   BD Insulin Syringe U/F 30G X 1/2" 0.5 ML Misc Generic drug: Insulin Syringe-Needle U-100 USE TO INJECT INSULIN 3 TIMES DAILY What changed: See  the new instructions.   carvedilol 3.125 MG tablet Commonly known as: COREG Take 1 tablet (3.125 mg total) by mouth 2 (two) times daily.   cetirizine 10 MG tablet Commonly known as: ZYRTEC Take 10 mg by mouth daily as needed for allergies.   CoQ-10 400 MG Caps Take 400 mg by mouth daily.   diclofenac sodium 1 % Gel Commonly known as: VOLTAREN Apply 1 application topically as needed (pain).   Entresto 24-26 MG Generic drug: sacubitril-valsartan Take 1 tablet by mouth 2 (two) times daily.   Farxiga 5 MG Tabs tablet Generic drug: dapagliflozin propanediol Take 2.5 mg by mouth 2 (two) times daily. Take  1 tablet by mouth once daily.   FreeStyle Libre 14 Day Sensor Misc USE 1 DEVICE BY TOPICAL ROUTE ROUTE EVERY 14 (FOURTEEN) DAYS. DX CODE E11.9 What changed: See the new instructions.   furosemide 40 MG tablet Commonly known as: LASIX Take 40 mg by mouth daily as needed.   gabapentin 300 MG capsule Commonly known as: NEURONTIN Take 1 capsule (300 mg total) by mouth 3 (three) times daily.   insulin aspart 100 UNIT/ML injection Commonly known as: NovoLOG INJECT 15-30 units under the skin three times daily before meals.   Insulin Pen Needle 29G X 12.7MM Misc Use daily to inject Tresiba insulin   Iron 27 240 (27 FE) MG tablet Generic drug: ferrous gluconate Take 120 mg by mouth daily.   metFORMIN 500 MG 24 hr tablet Commonly known as: GLUCOPHAGE-XR Take 1,500 mg by mouth every evening.   nitroGLYCERIN 0.4 MG SL tablet Commonly known as: NITROSTAT Place 0.4 mg under the tongue every 5 (five) minutes as needed for chest pain.   promethazine 25 MG tablet Commonly known as: PHENERGAN Take 1 tablet by mouth as needed for nausea.   rivaroxaban 20 MG Tabs tablet Commonly known as: Xarelto Take 1 tablet (20 mg total) by mouth daily with supper. Notes to patient: DO NOT START UNTIL Sunday 08/19/2019   rosuvastatin 5 MG tablet Commonly known as: CRESTOR Take 7.5 mg by  mouth every evening.   traMADol-acetaminophen 37.5-325 MG tablet Commonly known as: ULTRACET Take 1 tablet by mouth every 6 (six) hours as needed for moderate pain.   Tresiba 100 UNIT/ML Soln Generic drug: Insulin Degludec Inject 42 Units into the skin daily.   Ventolin HFA 108 (90 Base) MCG/ACT inhaler Generic drug: albuterol Inhale 2 puffs into the lungs 2 (two) times daily as needed for wheezing or shortness of breath.       Disposition:  Home  Discharge Instructions    Diet - low sodium heart healthy   Complete by: As directed    Increase activity slowly   Complete by: As directed      Follow-up Information    [place] [facility] Follow up.   Specialty: Cardiology Why: 08/17/2019 @ 10:30AM, wound check Contact information: [address], [place] [phone]       Thompson Grayer, MD Follow up.   Specialty: Cardiology Why: 11/19/2019 @ 1:45PM Contact information: [place] [address] [phone]           Duration of Discharge Encounter: Greater than 30 minutes including physician time.  Signed, Tommye Standard, PA-C 08/15/2019 11:00 AM   Doing well s/p CRT-P upgrade Device interrogation is reviewed and normal CXR reveals stable leads, no ptx  Small hematoma is noted Pressure dressing applied  Resume anticoagulation in 72 hours.  Follow-up this Friday in device clinic to remove pressure dressing and reassess  Thompson Grayer MD, [facility]

## 2019-08-14 NOTE — Progress Notes (Addendum)
Progress Note  Patient Name: Charles [name] Date of Encounter: 08/14/2019  Primary Cardiologist: Shelva Majestic, MD  EP: Dr. [name]  Subjective   Had some palpitations and associated SOB this AM briefly, no CP, and not ongoing.  Inpatient Medications    Scheduled Meds: . alfuzosin  10 mg Oral Q breakfast  . amiodarone  200 mg Oral BID  . aspirin  81 mg Oral Daily  . carvedilol  3.125 mg Oral BID WC  . chlorhexidine  60 mL Topical Once  . Chlorhexidine Gluconate Cloth  6 each Topical Daily  . gabapentin  300 mg Oral TID  . gentamicin irrigation  80 mg Irrigation On Call  . insulin aspart  0-15 Units Subcutaneous TID WC  . insulin glargine  42 Units Subcutaneous Daily  . mupirocin ointment  1 application Nasal BID  . rosuvastatin  7.5 mg Oral Daily  . sacubitril-valsartan  1 tablet Oral BID  . sodium chloride flush  3 mL Intravenous Q12H   Continuous Infusions: . sodium chloride 50 mL/hr at 08/14/19 0100  . sodium chloride    .  ceFAZolin (ANCEF) IV     PRN Meds: albuterol, polyethylene glycol, sodium chloride flush, traMADol-acetaminophen   Vital Signs    Vitals:   08/13/19 1931 08/14/19 0024 08/14/19 0400 08/14/19 0812  BP: 121/66 134/72 105/62 (!) 129/103  Pulse: 64 61 62 62  Resp: 16 18 19 20   Temp: 98.8 F (37.1 C) 97.9 F (36.6 C) 98.1 F (36.7 C) (!) 97.5 F (36.4 C)  TempSrc: Oral Oral Oral Oral  SpO2: 100% 96% 98% 99%  Weight:  125.1 kg    Height:        Intake/Output Summary (Last 24 hours) at 08/14/2019 0844 Last data filed at 08/14/2019 0818 Gross per 24 hour  Intake 1350 ml  Output 1545 ml  Net -195 ml   Last 3 Weights 08/14/2019 08/13/2019 08/13/2019  Weight (lbs) 275 lb 14.4 oz (No Data) 272 lb 14.4 oz  Weight (kg) 125.147 kg (No Data) 123.787 kg      Telemetry    AP, V sensed, PACs - Personally Reviewed  ECG    No new EKGs - Personally Reviewed  Physical Exam   GEN: No acute distress.   Neck: No JVD Cardiac: RRR, no murmurs,  rubs, or gallops.  Respiratory: CTA b/l. GI: Soft, nontender, non-distended  MS: No edema; No deformity. Neuro:  Nonfocal  Psych: Normal affect   Labs    High Sensitivity Troponin:   Recent Labs  Lab 07/30/19 1151 07/30/19 1405  TROPONINIHS 14 15      Chemistry Recent Labs  Lab 08/12/19 1553  NA 139  K 4.7  CL 106  CO2 26  GLUCOSE 125*  BUN 19  CREATININE 1.26*  CALCIUM 8.7*  GFRNONAA 57*  GFRAA >60  ANIONGAP 7     Hematology Recent Labs  Lab 08/12/19 1553  WBC 7.5  RBC 4.68  HGB 14.5  HCT 44.3  MCV 94.7  MCH 31.0  MCHC 32.7  RDW 13.9  PLT 188    BNPNo results for input(s): BNP, PROBNP in the last 168 hours.   DDimer No results for input(s): DDIMER in the last 168 hours.   Radiology    Dg Chest Portable 1 View Result Date: 08/12/2019 CLINICAL DATA:  Pacemaker misfiring, lightheadedness EXAM: PORTABLE CHEST 1 VIEW COMPARISON:  07/30/2019 FINDINGS: Cardiomegaly status post median sternotomy and CABG with left chest multi lead pacer. Aortic atherosclerosis.  Both lungs are clear. The visualized skeletal structures are unremarkable. IMPRESSION: Cardiomegaly without acute abnormality of the lungs in AP portable projection. Electronically Signed   By: Eddie Candle M.D.   On: 08/12/2019 16:12    Cardiac Studies    08/13/2019: TTE IMPRESSIONS  1. Left ventricular ejection fraction, by visual estimation, is 35 to 40%. The left ventricle has normal function. Normal left ventricular size. There is mildly increased left ventricular hypertrophy.  2. Left ventricular diastolic Doppler parameters are consistent with impaired relaxation pattern of LV diastolic filling.  3. Global right ventricle has normal systolic function.The right ventricular size is normal. No increase in right ventricular wall thickness.  4. Left atrial size was mildly dilated.  5. Right atrial size was mildly dilated.  6. The mitral valve is grossly normal. Moderate mitral valve regurgitation.  No evidence of mitral stenosis.  7. The tricuspid valve is normal in structure. Tricuspid valve regurgitation is trivial.  8. The aortic valve is normal in structure. Aortic valve regurgitation was not visualized by color flow Doppler. Structurally normal aortic valve, with no evidence of sclerosis or stenosis.  9. The pulmonic valve was normal in structure. Pulmonic valve regurgitation is not visualized by color flow Doppler. 10. Normal pulmonary artery systolic pressure. 11. A pacer wire is visualized. 12. The inferior vena cava is normal in size with greater than 50% respiratory variability, suggesting right atrial pressure of 3 mmHg.   04/01/18: TTE Study Conclusions - Left ventricle: Septal and apical akinesis inferior wall   hypokinesis. The cavity size was moderately dilated. Wall   thickness was increased in a pattern of moderate LVH. The   estimated ejection fraction was 30%. Left ventricular diastolic   function parameters were normal. - Aortic valve: Valve area (Vmax): 2.2 cm^2. - Mitral valve: Calcified annulus. Mildly thickened leaflets .   There was moderate regurgitation. - Left atrium: The atrium was moderately dilated. - Atrial septum: No defect or patent foramen ovale was identified.   2017 TTE with LVEF 45-50%, severe LVH   Patient Profile     73 y.o. male w/PMHx of CAD (CABG 2017), chronic CHF (mixed), Paroxysmal AFib, SSSx w/PPM, DM, OSA, HTN, HLD, CKD (III), chronic venous insufficiency, GERD admitted to [facility] yesterday with new onset palpitations, SOB found in AFib  Assessment & Plan    1. Paroxysmal AFib     CHA2DS2VAsc is 4, on warfarin     INR today is 2.0 (no warfarin yesterday)     Had DCCV on 07/30/2019 started on amio then     Symptomatic with his AFib, decreased exertional capacity, fatigue, SOB, lightheaded                    Resumed on 200mg  BID amiodarone   2. PPM     Known to have reached ERI, on 12/31/2018, VP <1% 3. ICM 4. LBBB      Exam does  not note any evidence of fluid OL, lungs are clear, trace if any edema           LVEF 35-40% by his echo yesterday Planned for dual chamber PPM upgrade to CRT-P today with Dr. [name] Answered the patient's follow up questions this morning, he remains agreeable to proceed.  3. CAD     No anginal complaints     Just saw cards team this month, felt to be doing very well  For questions or updates, please contact [place] Please consult [email or web address] for contact info under        Signed, Baldwin Jamaica, PA-C  08/14/2019, 8:44 AM     I have seen, examined the patient, and reviewed the above assessment and plan.  Changes to above are made where necessary.  On exam, RRR.  Maintaining sinus rhythm. Device is at ERI.  EF >35%.  He therefore does not meet criteria for ICD.  He does have LBBB with QRS > 150 msec and NYHA Class III CHF.  I therefore would advise upgrade to CRT-P at this time.  Risks, benefits, and alternatives to upgrade to CRT-P were discussed in detail today.  The patient understands that risks include but are not limited to bleeding, infection, pneumothorax, perforation, tamponade, vascular damage, renal failure, MI, stroke, death, damage to his existing leads, and lead dislodgement and wishes to proceed.  We will therefore plan to proceed this afternoon.   Co Sign: Thompson Grayer, MD 08/14/2019 10:30 AM

## 2019-08-14 NOTE — Progress Notes (Signed)
Received from Cath lab. Pt alert and oriented. Left chest  Pressure dressing CDI. Will monitor.

## 2019-08-14 NOTE — H&P (View-Only) (Signed)
Progress Note  Patient Name: Charles [name] Date of Encounter: 08/14/2019  Primary Cardiologist: Shelva Majestic, MD  EP: Dr. [name]  Subjective   Had some palpitations and associated SOB this AM briefly, no CP, and not ongoing.  Inpatient Medications    Scheduled Meds: . alfuzosin  10 mg Oral Q breakfast  . amiodarone  200 mg Oral BID  . aspirin  81 mg Oral Daily  . carvedilol  3.125 mg Oral BID WC  . chlorhexidine  60 mL Topical Once  . Chlorhexidine Gluconate Cloth  6 each Topical Daily  . gabapentin  300 mg Oral TID  . gentamicin irrigation  80 mg Irrigation On Call  . insulin aspart  0-15 Units Subcutaneous TID WC  . insulin glargine  42 Units Subcutaneous Daily  . mupirocin ointment  1 application Nasal BID  . rosuvastatin  7.5 mg Oral Daily  . sacubitril-valsartan  1 tablet Oral BID  . sodium chloride flush  3 mL Intravenous Q12H   Continuous Infusions: . sodium chloride 50 mL/hr at 08/14/19 0100  . sodium chloride    .  ceFAZolin (ANCEF) IV     PRN Meds: albuterol, polyethylene glycol, sodium chloride flush, traMADol-acetaminophen   Vital Signs    Vitals:   08/13/19 1931 08/14/19 0024 08/14/19 0400 08/14/19 0812  BP: 121/66 134/72 105/62 (!) 129/103  Pulse: 64 61 62 62  Resp: 16 18 19 20   Temp: 98.8 F (37.1 C) 97.9 F (36.6 C) 98.1 F (36.7 C) (!) 97.5 F (36.4 C)  TempSrc: Oral Oral Oral Oral  SpO2: 100% 96% 98% 99%  Weight:  125.1 kg    Height:        Intake/Output Summary (Last 24 hours) at 08/14/2019 0844 Last data filed at 08/14/2019 0818 Gross per 24 hour  Intake 1350 ml  Output 1545 ml  Net -195 ml   Last 3 Weights 08/14/2019 08/13/2019 08/13/2019  Weight (lbs) 275 lb 14.4 oz (No Data) 272 lb 14.4 oz  Weight (kg) 125.147 kg (No Data) 123.787 kg      Telemetry    AP, V sensed, PACs - Personally Reviewed  ECG    No new EKGs - Personally Reviewed  Physical Exam   GEN: No acute distress.   Neck: No JVD Cardiac: RRR, no murmurs,  rubs, or gallops.  Respiratory: CTA b/l. GI: Soft, nontender, non-distended  MS: No edema; No deformity. Neuro:  Nonfocal  Psych: Normal affect   Labs    High Sensitivity Troponin:   Recent Labs  Lab 07/30/19 1151 07/30/19 1405  TROPONINIHS 14 15      Chemistry Recent Labs  Lab 08/12/19 1553  NA 139  K 4.7  CL 106  CO2 26  GLUCOSE 125*  BUN 19  CREATININE 1.26*  CALCIUM 8.7*  GFRNONAA 57*  GFRAA >60  ANIONGAP 7     Hematology Recent Labs  Lab 08/12/19 1553  WBC 7.5  RBC 4.68  HGB 14.5  HCT 44.3  MCV 94.7  MCH 31.0  MCHC 32.7  RDW 13.9  PLT 188    BNPNo results for input(s): BNP, PROBNP in the last 168 hours.   DDimer No results for input(s): DDIMER in the last 168 hours.   Radiology    Dg Chest Portable 1 View Result Date: 08/12/2019 CLINICAL DATA:  Pacemaker misfiring, lightheadedness EXAM: PORTABLE CHEST 1 VIEW COMPARISON:  07/30/2019 FINDINGS: Cardiomegaly status post median sternotomy and CABG with left chest multi lead pacer. Aortic atherosclerosis.  Both lungs are clear. The visualized skeletal structures are unremarkable. IMPRESSION: Cardiomegaly without acute abnormality of the lungs in AP portable projection. Electronically Signed   By: Eddie Candle M.D.   On: 08/12/2019 16:12    Cardiac Studies    08/13/2019: TTE IMPRESSIONS  1. Left ventricular ejection fraction, by visual estimation, is 35 to 40%. The left ventricle has normal function. Normal left ventricular size. There is mildly increased left ventricular hypertrophy.  2. Left ventricular diastolic Doppler parameters are consistent with impaired relaxation pattern of LV diastolic filling.  3. Global right ventricle has normal systolic function.The right ventricular size is normal. No increase in right ventricular wall thickness.  4. Left atrial size was mildly dilated.  5. Right atrial size was mildly dilated.  6. The mitral valve is grossly normal. Moderate mitral valve regurgitation.  No evidence of mitral stenosis.  7. The tricuspid valve is normal in structure. Tricuspid valve regurgitation is trivial.  8. The aortic valve is normal in structure. Aortic valve regurgitation was not visualized by color flow Doppler. Structurally normal aortic valve, with no evidence of sclerosis or stenosis.  9. The pulmonic valve was normal in structure. Pulmonic valve regurgitation is not visualized by color flow Doppler. 10. Normal pulmonary artery systolic pressure. 11. A pacer wire is visualized. 12. The inferior vena cava is normal in size with greater than 50% respiratory variability, suggesting right atrial pressure of 3 mmHg.   04/01/18: TTE Study Conclusions - Left ventricle: Septal and apical akinesis inferior wall   hypokinesis. The cavity size was moderately dilated. Wall   thickness was increased in a pattern of moderate LVH. The   estimated ejection fraction was 30%. Left ventricular diastolic   function parameters were normal. - Aortic valve: Valve area (Vmax): 2.2 cm^2. - Mitral valve: Calcified annulus. Mildly thickened leaflets .   There was moderate regurgitation. - Left atrium: The atrium was moderately dilated. - Atrial septum: No defect or patent foramen ovale was identified.   2017 TTE with LVEF 45-50%, severe LVH   Patient Profile     73 y.o. male w/PMHx of CAD (CABG 2017), chronic CHF (mixed), Paroxysmal AFib, SSSx w/PPM, DM, OSA, HTN, HLD, CKD (III), chronic venous insufficiency, GERD admitted to [facility] yesterday with new onset palpitations, SOB found in AFib  Assessment & Plan    1. Paroxysmal AFib     CHA2DS2VAsc is 4, on warfarin     INR today is 2.0 (no warfarin yesterday)     Had DCCV on 07/30/2019 started on amio then     Symptomatic with his AFib, decreased exertional capacity, fatigue, SOB, lightheaded                    Resumed on 200mg  BID amiodarone   2. PPM     Known to have reached ERI, on 12/31/2018, VP <1% 3. ICM 4. LBBB      Exam does  not note any evidence of fluid OL, lungs are clear, trace if any edema           LVEF 35-40% by his echo yesterday Planned for dual chamber PPM upgrade to CRT-P today with Dr. [name] Answered the patient's follow up questions this morning, he remains agreeable to proceed.  3. CAD     No anginal complaints     Just saw cards team this month, felt to be doing very well  For questions or updates, please contact [place] Please consult [email or web address] for contact info under        Signed, Baldwin Jamaica, PA-C  08/14/2019, 8:44 AM     I have seen, examined the patient, and reviewed the above assessment and plan.  Changes to above are made where necessary.  On exam, RRR.  Maintaining sinus rhythm. Device is at ERI.  EF >35%.  He therefore does not meet criteria for ICD.  He does have LBBB with QRS > 150 msec and NYHA Class III CHF.  I therefore would advise upgrade to CRT-P at this time.  Risks, benefits, and alternatives to upgrade to CRT-P were discussed in detail today.  The patient understands that risks include but are not limited to bleeding, infection, pneumothorax, perforation, tamponade, vascular damage, renal failure, MI, stroke, death, damage to his existing leads, and lead dislodgement and wishes to proceed.  We will therefore plan to proceed this afternoon.   Co Sign: Thompson Grayer, MD 08/14/2019 10:30 AM

## 2019-08-15 ENCOUNTER — Encounter (HOSPITAL_COMMUNITY): Payer: Self-pay | Admitting: Internal Medicine

## 2019-08-15 ENCOUNTER — Other Ambulatory Visit: Payer: Self-pay

## 2019-08-15 ENCOUNTER — Inpatient Hospital Stay (HOSPITAL_COMMUNITY): Payer: BC Managed Care – PPO

## 2019-08-15 DIAGNOSIS — I447 Left bundle-branch block, unspecified: Secondary | ICD-10-CM

## 2019-08-15 DIAGNOSIS — I519 Heart disease, unspecified: Secondary | ICD-10-CM

## 2019-08-15 LAB — BASIC METABOLIC PANEL
Anion gap: 8 (ref 5–15)
BUN: 19 mg/dL (ref 8–23)
CO2: 25 mmol/L (ref 22–32)
Calcium: 8.5 mg/dL — ABNORMAL LOW (ref 8.9–10.3)
Chloride: 105 mmol/L (ref 98–111)
Creatinine, Ser: 1.36 mg/dL — ABNORMAL HIGH (ref 0.61–1.24)
GFR calc Af Amer: 60 mL/min — ABNORMAL LOW (ref >60)
GFR calc non Af Amer: 52 mL/min — ABNORMAL LOW (ref >60)
Glucose, Bld: 151 mg/dL — ABNORMAL HIGH (ref 70–99)
Potassium: 4 mmol/L (ref 3.5–5.1)
Sodium: 138 mmol/L (ref 135–145)

## 2019-08-15 LAB — GLUCOSE, CAPILLARY
Glucose-Capillary: 135 mg/dL — ABNORMAL HIGH (ref 70–99)
Glucose-Capillary: 205 mg/dL — ABNORMAL HIGH (ref 70–99)

## 2019-08-15 LAB — PROTIME-INR
INR: 1.7 — ABNORMAL HIGH (ref 0.8–1.2)
Prothrombin Time: 19.8 seconds — ABNORMAL HIGH (ref 11.4–15.2)

## 2019-08-15 MED ORDER — RIVAROXABAN 20 MG PO TABS: 20.0000 mg | ORAL_TABLET | Freq: Every day | ORAL | 6 refills | Status: DC

## 2019-08-15 MED ORDER — AMIODARONE HCL 200 MG PO TABS: 200.0000 mg | ORAL_TABLET | Freq: Every day | ORAL | 6 refills | Status: DC

## 2019-08-15 NOTE — Care Management (Signed)
Per Howard Pouch. W/ [facility] [employer] Co-pay for Xarelto 20 mg. daily NO CO-PAY. No PA required Pharmacy : [facility]

## 2019-08-15 NOTE — TOC Progression Note (Signed)
Transition of Care [facility]) - Progression Note    Patient Details  Name: Charles [name] MRN: [id number] Date of Birth: Oct 15, 1946  Transition of [facility]) CM/SW Contact  Zenon Mayo, RN Phone Number: 08/15/2019, 8:49 AM  Clinical Narrative:    Benefit check in process for xarelto.         Expected Discharge Plan and Services           Expected Discharge Date: 08/15/19                                     Social Determinants of Health (SDOH) Interventions    Readmission Risk Interventions No flowsheet data found.

## 2019-08-15 NOTE — Progress Notes (Signed)
Pt has orders to be discharged. Discharge instructions given and pt has no additional questions at this time. Medication regimen reviewed and pt educated. Pt verbalized understanding and has no additional questions. Telemetry box removed. IV removed and site in good condition. Pt stable and waiting for transportation. 

## 2019-08-15 NOTE — Discharge Instructions (Addendum)
After Your Pacemaker   You have a [employer] Pacemaker   Do not lift your arm above shoulder height for 1 week after your procedure. After 7 days, you may progress as below.     Tuesday August 21, 2019  Wednesday August 22, 2019 Thursday August 23, 2019 Friday August 24, 2019    Do not lift, push, pull, or carry anything over 10 pounds with the affected arm until 6 weeks (Tuesday September 25, 2019) after your procedure.    Do not drive until your SECOND wound check visit, or until instructed by your healthcare provider that you are safe to do so.    Monitor your pacemaker site for redness, swelling, and drainage. Call the device clinic at [phone] if you experience these symptoms or fever/chills.  LEAVE CURRENT BULKY DRESSING IN PLACE UNTIL YOUR VISIT ON Friday, DO NOT REMOVE   Do not shower until cleared to at [place] your wound check visit.   Avoid lotions, ointments, or perfumes over your incision until it is well-healed.   You may use a hot tub or a pool AFTER your wound check appointment if the incision is completely closed.   Your Pacemaker may be MRI compatible. We will discuss this at your first follow up/wound check. .    Remote monitoring is used to monitor your pacemaker from home. This monitoring is scheduled every 91 days by our office. It allows [place] to keep an eye on the functioning of your device to ensure it is working properly. You will routinely see your Electrophysiologist annually (more often if necessary).    Pacemaker Implantation, Care After This sheet gives you information about how to care for yourself after your procedure. Your health care provider may also give you more specific instructions. If you have problems or questions, contact your health care provider. What can I expect after the procedure? After the procedure, it is common to have:  Mild pain.  Slight bruising.  Some swelling over the incision.  A slight bump over the skin  where the device was placed. Sometimes, it is possible to feel the device under the skin. This is normal.  You should received your Pacemaker ID card within 4-8 weeks. Follow these instructions at home: Medicines  Take over-the-counter and prescription medicines only as told by your health care provider.  If you were prescribed an antibiotic medicine, take it as told by your health care provider. Do not stop taking the antibiotic even if you start to feel better. Wound care     Do not remove the bandage on your chest until directed to do so by your health care provider.  After your bandage is removed, you may see pieces of tape called skin adhesive strips over the area where the cut was made (incision site). Let them fall off on their own.  Check the incision site every day to make sure it is not infected, bleeding, or starting to pull apart.  Do not use lotions or ointments near the incision site unless directed to do so.  Keep the incision area clean and dry for 7 days after the procedure or as directed by your health care provider. It takes several weeks for the incision site to completely heal.  Do not take baths, swim, or use a hot tub for 7-10 days or as otherwise directed by your health care provider. Activity  Do not drive or use heavy machinery while taking prescription pain medicine.  Do not drive for 24 hours  if you were given a medicine to help you relax (sedative).  Check with your health care provider before you start to drive or play sports.  Avoid sudden jerking, pulling, or chopping movements that pull your upper arm far away from your body. Avoid these movements for at least 6 weeks or as long as told by your health care provider.  Do not lift your upper arm above your shoulders for at least 6 weeks or as long as told by your health care provider. This means no tennis, golf, or swimming.  You may go back to work when your health care provider says it is  okay. Pacemaker care  You may be shown how to transfer data from your pacemaker through the phone to your health care provider.  Always let all health care providers know about your pacemaker before you have any medical procedures or tests.  Wear a medical ID bracelet or necklace stating that you have a pacemaker. Carry a pacemaker ID card with you at all times.  Your pacemaker battery will last for 5-15 years. Routine checks by your health care provider will let the health care provider know when the battery is starting to run down. The pacemaker will need to be replaced when the battery starts to run down.  Do not use amateur Chief of Staff. Other electrical devices are safe to use, including power tools, lawn mowers, and speakers. If you are unsure of whether something is safe to use, ask your health care provider.  When using your cell phone, hold it to the ear opposite the pacemaker. Do not leave your cell phone in a pocket over the pacemaker.  Avoid places or objects that have a strong electric or magnetic field, including: ? Airport Herbalist. When at the airport, let officials know that you have a pacemaker. ? Power plants. ? Large electrical generators. ? Radiofrequency transmission towers, such as cell phone and radio towers. General instructions  Weigh yourself every day. If you suddenly gain weight, fluid may be building up in your body.  Keep all follow-up visits as told by your health care provider. This is important. Contact a health care provider if:  You gain weight suddenly.  Your legs or feet swell.  It feels like your heart is fluttering or skipping beats (heart palpitations).  You have chills or a fever.  You have more redness, swelling, or pain around your incisions.  You have more fluid or blood coming from your incisions.  Your incisions feel warm to the touch.  You have pus or a bad smell coming from your  incisions. Get help right away if:  You have chest pain.  You have trouble breathing or are short of breath.  You become extremely tired.  You are light-headed or you faint. This information is not intended to replace advice given to you by your health care provider. Make sure you discuss any questions you have with your health care provider.  Information on my medicine - XARELTO (Rivaroxaban)  This medication education was reviewed with me or my healthcare representative as part of my discharge preparation.    Why was Xarelto prescribed for you? Xarelto was prescribed for you to reduce the risk of a blood clot forming that can cause a stroke if you have a medical condition called atrial fibrillation (a type of irregular heartbeat).  What do you need to know about xarelto ? Take your Xarelto ONCE DAILY at the same time every  day with your evening meal. If you have difficulty swallowing the tablet whole, you may crush it and mix in applesauce just prior to taking your dose.  Take Xarelto exactly as prescribed by your doctor and DO NOT stop taking Xarelto without talking to the doctor who prescribed the medication.  Stopping without other stroke prevention medication to take the place of Xarelto may increase your risk of developing a clot that causes a stroke.  Refill your prescription before you run out.  After discharge, you should have regular check-up appointments with your healthcare provider that is prescribing your Xarelto.  In the future your dose may need to be changed if your kidney function or weight changes by a significant amount.  What do you do if you miss a dose? If you are taking Xarelto ONCE DAILY and you miss a dose, take it as soon as you remember on the same day then continue your regularly scheduled once daily regimen the next day. Do not take two doses of Xarelto at the same time or on the same day.   Important Safety Information A possible side effect  of Xarelto is bleeding. You should call your healthcare provider right away if you experience any of the following: ? Bleeding from an injury or your nose that does not stop. ? Unusual colored urine (red or dark brown) or unusual colored stools (red or black). ? Unusual bruising for unknown reasons. ? A serious fall or if you hit your head (even if there is no bleeding).  Some medicines may interact with Xarelto and might increase your risk of bleeding while on Xarelto. To help avoid this, consult your healthcare provider or pharmacist prior to using any new prescription or non-prescription medications, including herbals, vitamins, non-steroidal anti-inflammatory drugs (NSAIDs) and supplements.  This website has more information on Xarelto: [email or web address]-benson.[email or web address]

## 2019-08-17 ENCOUNTER — Ambulatory Visit (INDEPENDENT_AMBULATORY_CARE_PROVIDER_SITE_OTHER): Payer: Medicare Other | Admitting: Student

## 2019-08-17 ENCOUNTER — Ambulatory Visit: Payer: BC Managed Care – PPO | Admitting: Endocrinology

## 2019-08-17 ENCOUNTER — Other Ambulatory Visit: Payer: Self-pay

## 2019-08-17 DIAGNOSIS — T148XXA Other injury of unspecified body region, initial encounter: Secondary | ICD-10-CM

## 2019-08-17 DIAGNOSIS — I495 Sick sinus syndrome: Secondary | ICD-10-CM

## 2019-08-17 NOTE — Progress Notes (Signed)
Wound/hematoma checked per Dr. Ewell Poe  Site looks stable and much more comfortable per patient. No drainage or bleeding. Pt has small, soft area of edema circumferential to his device/pocket. He feels much less stiff, and is non-tender.   Discussed with Dr. [name].   Will continue to hold Xarelto until Wednesday, 08/22/2019.   Pt will continue to observe for swelling. Knows to call back immediately for any worsening of his hematoma, before, or after resuming Xarelto.   Legrand Como [address]" [place], PA-C  08/17/2019 2:12 PM

## 2019-08-17 NOTE — Patient Instructions (Signed)
Medication Instructions:   WED START BACK TAKING XARELTO   If you need a refill on your cardiac medications before your next appointment, please call your pharmacy.   Lab work: NONE ORDERED  TODAY   If you have labs (blood work) drawn today and your tests are completely normal, you will receive your results only by: [name] MyChart Message (if you have MyChart) OR . A paper copy in the mail If you have any lab test that is abnormal or we need to change your treatment, we will call you to review the results.  Testing/Procedures: NONE ORDERED  TODAY    Follow-Up:  AS SCHEDULED    Any Other Special Instructions Will Be Listed Below (If Applicable).

## 2019-08-28 ENCOUNTER — Other Ambulatory Visit: Payer: Self-pay

## 2019-08-28 ENCOUNTER — Ambulatory Visit (INDEPENDENT_AMBULATORY_CARE_PROVIDER_SITE_OTHER): Payer: Medicare Other | Admitting: *Deleted

## 2019-08-28 DIAGNOSIS — I495 Sick sinus syndrome: Secondary | ICD-10-CM | POA: Diagnosis not present

## 2019-08-28 NOTE — Patient Instructions (Addendum)
Call office on Wednesday 08/29/2019 to set up remote monitor. Call device clinic if swelling increases at wound site, you develop drainage from site or if you develop a fever.

## 2019-09-07 LAB — CUP PACEART INCLINIC DEVICE CHECK
Battery Remaining Longevity: 73 mo
Battery Voltage: 3.02 V
Brady Statistic RA Percent Paced: 92 %
Brady Statistic RV Percent Paced: 99.64 %
Date Time Interrogation Session: 20201006193647
Implantable Lead Implant Date: 20091007
Implantable Lead Implant Date: 20091007
Implantable Lead Location: 753859
Implantable Lead Location: 753860
Implantable Pulse Generator Implant Date: 20091007
Lead Channel Impedance Value: 1125 Ohm
Lead Channel Impedance Value: 412.5 Ohm
Lead Channel Impedance Value: 612.5 Ohm
Lead Channel Pacing Threshold Amplitude: 0.75 V
Lead Channel Pacing Threshold Amplitude: 0.75 V
Lead Channel Pacing Threshold Amplitude: 0.75 V
Lead Channel Pacing Threshold Amplitude: 0.75 V
Lead Channel Pacing Threshold Pulse Width: 0.4 ms
Lead Channel Pacing Threshold Pulse Width: 0.4 ms
Lead Channel Pacing Threshold Pulse Width: 0.5 ms
Lead Channel Pacing Threshold Pulse Width: 0.5 ms
Lead Channel Sensing Intrinsic Amplitude: 1 mV
Lead Channel Sensing Intrinsic Amplitude: 12 mV
Lead Channel Setting Pacing Amplitude: 2 V
Lead Channel Setting Pacing Amplitude: 2.5 V
Lead Channel Setting Pacing Amplitude: 3.5 V
Lead Channel Setting Pacing Pulse Width: 0.4 ms
Lead Channel Setting Pacing Pulse Width: 0.5 ms
Lead Channel Setting Sensing Sensitivity: 2 mV
Pulse Gen Model: 5826
Pulse Gen Serial Number: 9157656

## 2019-09-07 NOTE — Progress Notes (Signed)
Wound check appointment. Steri-strips removed. Wound without redness, decreased edema from previous wound check.  Incision edges approximated, wound well healed. Normal device function. Thresholds, sensing, and impedances consistent with implant measurements. Device programmed at chronic settings due to mature leads .Histogram distribution appropriate for patient and level of activity. No mode switches or high ventricular rates noted. Patient educated about wound care.  ROV with Dr [name] on 11/19/19. Next remote due 11/28/19

## 2019-09-14 ENCOUNTER — Other Ambulatory Visit: Payer: Self-pay | Admitting: Cardiovascular Disease

## 2019-09-17 ENCOUNTER — Other Ambulatory Visit: Payer: Self-pay | Admitting: Endocrinology

## 2019-09-21 ENCOUNTER — Telehealth: Payer: Self-pay

## 2019-09-21 NOTE — Telephone Encounter (Signed)
-----   Message from Elayne Snare, MD sent at 09/18/2019  9:04 AM EDT ----- Regarding: Follow-up as needed Please schedule follow-up with labs for diabetes

## 2019-09-21 NOTE — Telephone Encounter (Signed)
Left message with wife for patient to call back.

## 2019-10-03 ENCOUNTER — Telehealth: Payer: Self-pay

## 2019-10-03 NOTE — Telephone Encounter (Signed)
Spoke with patient and he has taken a Lasix 40 mg 1/2 tablet over the last 3 days because he felt that he was retaining fluid around his belly. Patient is feeling better now and feels like waiting until next weeks appointment is ok. Per patient he does not feel this is urgent and feeling of holding on to fluid improved. He will weigh himself daily each am and call back if weight gain of 3 pounds in 24 hours/5 pounds in a week or shortness of breath worse.

## 2019-10-03 NOTE — Telephone Encounter (Signed)
Patient reports he has SOB after waking up in the morning that improves after a few hours. @ weeks ago he got up to urinate and became dizzy and passed out. Unknown how long he was unconscious. He reports dizziness with position change intermittently since beginning pacerone and Coreg. He has spaced out the time between taking each med which seems to have helped decrease the dizziness. Remote transmission was sent and reviewed in [place] and showed no episodes or events, AT/AF burden is 0%, device function is WNL, AP 96% and BiVP 99%. Patient has follow up with cardiologist Dr Claiborne Billings 10/08/19.

## 2019-10-03 NOTE — Telephone Encounter (Signed)
The pt states when he wakes up in the mornings he has SOB just going to urinate. The pt woke up on the floor a week ago and did not know how he got on the floor. The pt states he been feeling SOB for about 2 weeks now. I had the pt to send a manual transmission with his home monitor for the nurse to review. Transmission received. I let the pt speak with Jenny Reichmann, Rn.

## 2019-10-04 ENCOUNTER — Ambulatory Visit: Payer: Medicare Other | Admitting: Internal Medicine

## 2019-10-08 ENCOUNTER — Encounter: Payer: Self-pay | Admitting: Cardiovascular Disease

## 2019-10-08 ENCOUNTER — Other Ambulatory Visit: Payer: Self-pay

## 2019-10-08 ENCOUNTER — Ambulatory Visit (INDEPENDENT_AMBULATORY_CARE_PROVIDER_SITE_OTHER): Payer: Medicare Other | Admitting: Cardiovascular Disease

## 2019-10-08 VITALS — BP 138/79 | HR 70 | Temp 97.7°F | Ht 69.0 in | Wt 282.0 lb

## 2019-10-08 DIAGNOSIS — Z951 Presence of aortocoronary bypass graft: Secondary | ICD-10-CM

## 2019-10-08 DIAGNOSIS — I255 Ischemic cardiomyopathy: Secondary | ICD-10-CM

## 2019-10-08 DIAGNOSIS — I5022 Chronic systolic (congestive) heart failure: Secondary | ICD-10-CM

## 2019-10-08 DIAGNOSIS — I2510 Atherosclerotic heart disease of native coronary artery without angina pectoris: Secondary | ICD-10-CM | POA: Diagnosis not present

## 2019-10-08 DIAGNOSIS — I10 Essential (primary) hypertension: Secondary | ICD-10-CM | POA: Diagnosis not present

## 2019-10-08 DIAGNOSIS — R600 Localized edema: Secondary | ICD-10-CM

## 2019-10-08 DIAGNOSIS — Z7901 Long term (current) use of anticoagulants: Secondary | ICD-10-CM

## 2019-10-08 DIAGNOSIS — G4733 Obstructive sleep apnea (adult) (pediatric): Secondary | ICD-10-CM

## 2019-10-08 DIAGNOSIS — Z9989 Dependence on other enabling machines and devices: Secondary | ICD-10-CM

## 2019-10-08 NOTE — Patient Instructions (Signed)
Medication Instructions:  DECREASE LASIX 20MG  DAILY-AS-NEEDED  The current medical regimen is effective;  continue present plan and medications as directed. Please refer to the Current Medication list given to you today. If you need a refill on your cardiac medications before your next appointment, please call your pharmacy.  Special Instructions: CONTINUE TO WEAR YOUR COMPRESSION STOCKINGS  Follow-Up: IN 6 months Please call our office 2 months in advance, FEB 2021 to schedule this MAY 2021 appointment. In Person Shelva Majestic, MD.    At [facility], you and your health needs are our priority.  As part of our continuing mission to provide you with exceptional heart care, we have created designated Provider Care Teams.  These Care Teams include your primary Cardiologist (physician) and Advanced Practice Providers (APPs -  Physician Assistants and Nurse Practitioners) who all work together to provide you with the care you need, when you need it.  Thank you for choosing [facility] at [facility]!!

## 2019-10-08 NOTE — Progress Notes (Signed)
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 in addition to insulin and metformin.  He states his blood pressures have been low in the morning and dose reduction of his Metformin may be necessary but I will defer this to Dr. Dwyane Dee.  He is on Xarelto with his chads vas score of 4.  He continues to be on rosuvastatin 5 mg for hyperlipidemia.  He continues to use CPAP with 100% compliance for his obstructive sleep apnea.  Most recent download is improved with his pressure adjustments made at his last visit.  He is requiring high pressures with a 95th percentile pressure at 19.4 cm.  AHI is 3.9.  I will see him in 6 months for reevaluation.    Medication Adjustments/Labs and Tests Ordered: Current medicines are reviewed at length with the patient today.  Concerns regarding medicines are outlined  above.  Medication changes, Labs and Tests ordered today are listed in the Patient Instructions below. Patient Instructions  Medication Instructions:  DECREASE LASIX 20MG DAILY-AS-NEEDED  The current medical regimen is effective;  continue present plan and medications as directed. Please refer to the Current Medication list given to you today. If you need a refill on your cardiac medications before your next appointment, please call your pharmacy.  Special Instructions: CONTINUE TO WEAR YOUR COMPRESSION STOCKINGS  Follow-Up: IN 6 months  Please call our office 2 months in advance, FEB 2021 to schedule this MAY 2021 appointment. In Person Shelva Majestic, MD.    At [facility], you and your health needs are our priority.  As part of our continuing mission to provide you with exceptional heart care, we have created designated Provider Care Teams.  These Care Teams include your primary Cardiologist (physician) and Advanced Practice Providers (APPs -  Physician Assistants and Nurse Practitioners) who all work together to provide you with the care you need, when you need it.  Thank you for choosing [facility]!!          Signed, Shelva Majestic, MD  10/14/2019 10:30 AM    [facility] [address], [place], [place], [place]  [address] Phone: [phone]

## 2019-10-09 ENCOUNTER — Telehealth: Payer: Self-pay | Admitting: Internal Medicine

## 2019-10-09 ENCOUNTER — Telehealth: Payer: Self-pay | Admitting: Cardiovascular Disease

## 2019-10-09 NOTE — Telephone Encounter (Signed)
Spoke with patient and confirmed the one change of Lasix only, verbalized understanding. Confirmed with Meredeth Ide LPN who worked with patient/Dr Claiborne Billings yesterday

## 2019-10-09 NOTE — Telephone Encounter (Signed)
New Message  Pt would like to know if he could go back to boflex to work out  Please call back to discuss

## 2019-10-09 NOTE — Telephone Encounter (Signed)
Left message for patient, device implanted September this year and will probably need to wait to start working out until follow up appt 11/19/19 with allred but will forward this message to dr allred to confirm.

## 2019-10-09 NOTE — Telephone Encounter (Signed)
Patient is needing clarifications on medication changes from last appointment.

## 2019-10-10 NOTE — Telephone Encounter (Signed)
Spoke with patient. Advised that as it has been >6 weeks since his BiV upgrade procedure, safe to progress activity. Advised to start low and progress weight as tolerated. Pt verbalizes understanding and agreement with plan.

## 2019-10-11 ENCOUNTER — Other Ambulatory Visit (INDEPENDENT_AMBULATORY_CARE_PROVIDER_SITE_OTHER): Payer: Medicare Other

## 2019-10-11 ENCOUNTER — Telehealth: Payer: Self-pay

## 2019-10-11 DIAGNOSIS — E1165 Type 2 diabetes mellitus with hyperglycemia: Secondary | ICD-10-CM

## 2019-10-11 DIAGNOSIS — E782 Mixed hyperlipidemia: Secondary | ICD-10-CM | POA: Diagnosis not present

## 2019-10-11 DIAGNOSIS — Z794 Long term (current) use of insulin: Secondary | ICD-10-CM | POA: Diagnosis not present

## 2019-10-11 LAB — COMPREHENSIVE METABOLIC PANEL
ALT: 12 U/L (ref 0–53)
AST: 11 U/L (ref 0–37)
Albumin: 3.9 g/dL (ref 3.5–5.2)
Alkaline Phosphatase: 48 U/L (ref 39–117)
BUN: 19 mg/dL (ref 6–23)
CO2: 24 mEq/L (ref 19–32)
Calcium: 8.6 mg/dL (ref 8.4–10.5)
Chloride: 106 mEq/L (ref 96–112)
Creatinine, Ser: 1.12 mg/dL (ref 0.40–1.50)
GFR: 64.3 mL/min (ref >60.00)
Glucose, Bld: 151 mg/dL — ABNORMAL HIGH (ref 70–99)
Potassium: 4.3 mEq/L (ref 3.5–5.1)
Sodium: 140 mEq/L (ref 135–145)
Total Bilirubin: 0.4 mg/dL (ref 0.2–1.2)
Total Protein: 6.7 g/dL (ref 6.0–8.3)

## 2019-10-11 LAB — LIPID PANEL
Cholesterol: 97 mg/dL (ref 0–200)
HDL: 44.9 mg/dL (ref >39.00)
LDL Cholesterol: 34 mg/dL (ref 0–99)
NonHDL: 51.97
Total CHOL/HDL Ratio: 2
Triglycerides: 89 mg/dL (ref 0.0–149.0)
VLDL: 17.8 mg/dL (ref 0.0–40.0)

## 2019-10-11 LAB — HEMOGLOBIN A1C: Hgb A1c MFr Bld: 6.3 % (ref 4.6–6.5)

## 2019-10-11 NOTE — Telephone Encounter (Signed)
Call returned to Pt.  Advised ok to return to working out with [employer].  Pt thanked nurse for call.

## 2019-10-14 ENCOUNTER — Encounter: Payer: Self-pay | Admitting: Cardiovascular Disease

## 2019-10-15 ENCOUNTER — Telehealth: Payer: Self-pay | Admitting: Internal Medicine

## 2019-10-15 ENCOUNTER — Other Ambulatory Visit: Payer: Self-pay | Admitting: Endocrinology

## 2019-10-15 MED ORDER — ROSUVASTATIN CALCIUM 5 MG PO TABS: 5.0000 mg | ORAL_TABLET | Freq: Every evening | ORAL | 3 refills | Status: DC

## 2019-10-15 NOTE — Telephone Encounter (Signed)
Spoke with pt, he is willing to try taking the crestor again and will titrate to a dose that he can tolerate.

## 2019-10-15 NOTE — Telephone Encounter (Signed)
Spoke with pt, he reports he has tried zocor, lipitor and now crestor and they all cause joint pain. Will forward to dr Claiborne Billings to review and advise.

## 2019-10-15 NOTE — Telephone Encounter (Signed)
Most recent LDL cholesterol was excellent.  Try reducing rosuvastatin to 5 mg at least 2 times per week and if able to tolerate 2 times per week then try increasing to 3 times per week or every other day as tolerated.  If unable to take then transition to Zetia 10 mg daily

## 2019-10-15 NOTE — Telephone Encounter (Signed)
New Message     Pt c/o medication issue:  1. Name of Medication: Rosuvastatin   2. How are you currently taking this medication (dosage and times per day)? Stopped taking   3. Are you having a reaction (difficulty breathing--STAT)? No   4. What is your medication issue? Pt says he stopped taking his cholesterol medication for 3 days and the pain in his knees went away  He is wondering if there is something else he can take   Please call

## 2019-10-16 ENCOUNTER — Encounter: Payer: Self-pay | Admitting: Endocrinology

## 2019-10-16 ENCOUNTER — Ambulatory Visit (INDEPENDENT_AMBULATORY_CARE_PROVIDER_SITE_OTHER): Payer: Medicare Other | Admitting: Endocrinology

## 2019-10-16 VITALS — BP 136/72 | HR 72 | Ht 69.0 in | Wt 285.6 lb

## 2019-10-16 DIAGNOSIS — E1165 Type 2 diabetes mellitus with hyperglycemia: Secondary | ICD-10-CM

## 2019-10-16 DIAGNOSIS — Z794 Long term (current) use of insulin: Secondary | ICD-10-CM | POA: Diagnosis not present

## 2019-10-16 DIAGNOSIS — I255 Ischemic cardiomyopathy: Secondary | ICD-10-CM
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
## 2019-10-16 NOTE — Patient Instructions (Signed)
Tresiba 40 units daily  Compare Libre to fingerstick reading at times

## 2019-10-29 ENCOUNTER — Other Ambulatory Visit: Payer: Self-pay

## 2019-10-29 ENCOUNTER — Telehealth: Payer: Self-pay | Admitting: Endocrinology

## 2019-10-29 MED ORDER — XIGDUO XR 5-500 MG PO TB24: 2.0000 | ORAL_TABLET | Freq: Two times a day (BID) | ORAL | 1 refills | Status: DC

## 2019-10-29 NOTE — Telephone Encounter (Signed)
Called pt and gave him MD message. Pt verbalized understanding and Rx was sent.

## 2019-10-29 NOTE — Telephone Encounter (Signed)
Called pt and gave him MD message. Pt verbalized understanding, but did state that the same thing happened to his spouse and she was placed on Xigduo. Pt questions whether that is an option for him.

## 2019-10-29 NOTE — Telephone Encounter (Signed)
He has been on Metformin for quite some time and there is no alternative.  He can reduce the dose to 1 tablet at dinnertime for 7 days and then try to take 1 twice a day after that

## 2019-10-29 NOTE — Telephone Encounter (Signed)
He can certainly try Xigduo 5/500 twice daily and stop Metformin

## 2019-10-29 NOTE — Telephone Encounter (Signed)
Patient requests to be called at ph# [phone] re: Since starting Metformin - patient has been experiencing chronic diarrhea (he once stopped taking Metformin for a couple of days and symptoms improved). Patient would like to know if there is an alternative to the Metformin. Please call patient at the ph# listed above to advise.

## 2019-11-07 ENCOUNTER — Ambulatory Visit: Payer: BC Managed Care – PPO | Admitting: Cardiovascular Disease

## 2019-11-08 ENCOUNTER — Other Ambulatory Visit: Payer: Self-pay | Admitting: Medical

## 2019-11-12 ENCOUNTER — Other Ambulatory Visit: Payer: Self-pay | Admitting: Endocrinology

## 2019-11-19 ENCOUNTER — Ambulatory Visit (INDEPENDENT_AMBULATORY_CARE_PROVIDER_SITE_OTHER): Payer: Medicare Other | Admitting: *Deleted

## 2019-11-19 ENCOUNTER — Telehealth: Payer: Self-pay

## 2019-11-19 ENCOUNTER — Telehealth (INDEPENDENT_AMBULATORY_CARE_PROVIDER_SITE_OTHER): Payer: Medicare Other | Admitting: Internal Medicine

## 2019-11-19 ENCOUNTER — Other Ambulatory Visit: Payer: Self-pay

## 2019-11-19 ENCOUNTER — Encounter: Payer: Self-pay | Admitting: Internal Medicine

## 2019-11-19 VITALS — BP 138/80 | HR 70 | Ht 69.0 in | Wt 278.0 lb

## 2019-11-19 DIAGNOSIS — I10 Essential (primary) hypertension: Secondary | ICD-10-CM

## 2019-11-19 DIAGNOSIS — I2510 Atherosclerotic heart disease of native coronary artery without angina pectoris: Secondary | ICD-10-CM | POA: Diagnosis not present

## 2019-11-19 DIAGNOSIS — I255 Ischemic cardiomyopathy: Secondary | ICD-10-CM

## 2019-11-19 DIAGNOSIS — Z9989 Dependence on other enabling machines and devices: Secondary | ICD-10-CM

## 2019-11-19 DIAGNOSIS — G4733 Obstructive sleep apnea (adult) (pediatric): Secondary | ICD-10-CM

## 2019-11-19 DIAGNOSIS — I495 Sick sinus syndrome: Secondary | ICD-10-CM

## 2019-11-19 DIAGNOSIS — I5022 Chronic systolic (congestive) heart failure: Secondary | ICD-10-CM

## 2019-11-19 DIAGNOSIS — I110 Hypertensive heart disease with heart failure: Secondary | ICD-10-CM

## 2019-11-19 LAB — CUP PACEART REMOTE DEVICE CHECK
Battery Remaining Longevity: 76 mo
Battery Remaining Percentage: 95.5 %
Battery Voltage: 2.99 V
Brady Statistic AP VP Percent: 93 %
Brady Statistic AP VS Percent: 1 %
Brady Statistic AS VP Percent: 6 %
Brady Statistic AS VS Percent: 1 %
Brady Statistic RA Percent Paced: 92 %
Date Time Interrogation Session: 20201228135805
Implantable Lead Implant Date: 20091007
Implantable Lead Implant Date: 20091007
Implantable Lead Implant Date: 20200922
Implantable Lead Location: 753858
Implantable Lead Location: 753859
Implantable Lead Location: 753860
Implantable Pulse Generator Implant Date: 20200922
Lead Channel Impedance Value: 1150 Ohm
Lead Channel Impedance Value: 440 Ohm
Lead Channel Impedance Value: 630 Ohm
Lead Channel Pacing Threshold Amplitude: 0.75 V
Lead Channel Pacing Threshold Amplitude: 0.75 V
Lead Channel Pacing Threshold Amplitude: 0.75 V
Lead Channel Pacing Threshold Pulse Width: 0.4 ms
Lead Channel Pacing Threshold Pulse Width: 0.4 ms
Lead Channel Pacing Threshold Pulse Width: 0.5 ms
Lead Channel Sensing Intrinsic Amplitude: 1.5 mV
Lead Channel Sensing Intrinsic Amplitude: 12 mV
Lead Channel Setting Pacing Amplitude: 2 V
Lead Channel Setting Pacing Amplitude: 2.5 V
Lead Channel Setting Pacing Amplitude: 3.5 V
Lead Channel Setting Pacing Pulse Width: 0.4 ms
Lead Channel Setting Pacing Pulse Width: 0.5 ms
Lead Channel Setting Sensing Sensitivity: 2 mV
Pulse Gen Serial Number: 9157656

## 2019-11-19 NOTE — Telephone Encounter (Signed)
-----   Message from Thompson Grayer, MD sent at 11/19/2019  1:56 PM EST ----- Please schedule followup with Jonni Sanger with an echo prior to the visit.
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
## 2019-11-19 NOTE — Telephone Encounter (Signed)
Order placed for ECHO.  Will need f/u with AT after echo.  Forwarding to scheduler.

## 2019-11-26 ENCOUNTER — Telehealth: Payer: Self-pay

## 2019-11-26 NOTE — Telephone Encounter (Signed)
Referred to ICM clinic by Dr [name].  Spoke with patient and provided ICM intro.  Patient agreed to monthly ICM follow up.  He feels fine at this time and takes Lasix PRN.  Provided ICM phone number and encouraged to call if experiencing any fluid symptoms.  St Jude monitor is at bedside and explained the device report will automatically be transmitted between midnight and 6 AM when scheduled.  Voices no complaints today.  ICM remote transmission scheduled for 12/10/2019.

## 2019-11-28 ENCOUNTER — Ambulatory Visit (HOSPITAL_COMMUNITY): Payer: Medicare Other | Attending: Cardiovascular Disease

## 2019-11-28 ENCOUNTER — Other Ambulatory Visit: Payer: Self-pay

## 2019-11-28 DIAGNOSIS — I5022 Chronic systolic (congestive) heart failure: Secondary | ICD-10-CM | POA: Diagnosis present

## 2019-11-28 MED ORDER — PERFLUTREN LIPID MICROSPHERE
1.0000 mL | INTRAVENOUS | Status: AC | PRN
  Administered 2019-11-28: 2 mL via INTRAVENOUS

## 2019-12-10 ENCOUNTER — Ambulatory Visit (INDEPENDENT_AMBULATORY_CARE_PROVIDER_SITE_OTHER): Payer: Medicare Other

## 2019-12-10 DIAGNOSIS — Z95810 Presence of automatic (implantable) cardiac defibrillator: Secondary | ICD-10-CM | POA: Diagnosis not present

## 2019-12-10 DIAGNOSIS — I5022 Chronic systolic (congestive) heart failure: Secondary | ICD-10-CM | POA: Diagnosis not present

## 2019-12-12 NOTE — Progress Notes (Signed)
EPIC Encounter for ICM Monitoring  Patient Name: Charles [name] is a 74 y.o. male Date: 12/12/2019 Primary Care Physican: Cyndi Bender, PA-C Primary Cardiologist: Claiborne Billings Electrophysiologist: [name] Bi-V Pacing:  >99%  12/12/2019 Weight: 278 lbs        1st remote transmission. Heart Failure questions reviewed.  Pt took PRN Furosemide this morning due to he felt he had some fluid.  BP has been running higher than normal, 140-150/70 and pulse in 70's.  He exercises at home using Bow flex.   Report: Thoracic impedance normal but was suggesting possible fluid accumulation from 12/02/2019 - 12/06/2019.   Prescribed: Furosemide 40 mg take 20 mg by mouth as needed   Recommendations: .  Encouraged to call if experiencing fluid symptoms.  Follow-up plan: ICM clinic phone appointment on 01/14/2020.   91 day device clinic remote transmission 02/18/2020.  Office appt 12/24/2019 with Oda Kilts, PA.    Copy of ICM check sent to Dr. [name].   3 month ICM trend: 12/10/2019    1 Year ICM trend:       Rosalene Billings, RN 12/12/2019 3:50 PM

## 2019-12-17 ENCOUNTER — Other Ambulatory Visit: Payer: Self-pay | Admitting: Student

## 2019-12-17 DIAGNOSIS — I5022 Chronic systolic (congestive) heart failure: Secondary | ICD-10-CM

## 2019-12-24 ENCOUNTER — Encounter: Payer: Medicare Other | Admitting: Student

## 2019-12-26 ENCOUNTER — Ambulatory Visit (HOSPITAL_COMMUNITY): Payer: Medicare Other | Attending: Student

## 2019-12-26 ENCOUNTER — Other Ambulatory Visit: Payer: Self-pay

## 2019-12-26 ENCOUNTER — Ambulatory Visit (INDEPENDENT_AMBULATORY_CARE_PROVIDER_SITE_OTHER): Payer: Medicare Other | Admitting: Student

## 2019-12-26 DIAGNOSIS — Z95810 Presence of automatic (implantable) cardiac defibrillator: Secondary | ICD-10-CM | POA: Diagnosis not present

## 2019-12-26 DIAGNOSIS — I5022 Chronic systolic (congestive) heart failure: Secondary | ICD-10-CM | POA: Diagnosis present

## 2019-12-26 MED ORDER — CARVEDILOL 3.125 MG PO TABS: 6.2500 mg | ORAL_TABLET | Freq: Two times a day (BID) | ORAL | 3 refills | Status: DC

## 2019-12-26 NOTE — Progress Notes (Addendum)
    Electrophysiology CRT AV optimization   Date: 12/26/2019  ID:  Charles [name], DOB [birth year], MRN [id number]  PCP: Cyndi Bender, PA-C Primary Cardiologist: Shelva Majestic, MD Electrophysiologist: Dr. [name]  CC: Heart failure despite LV lead placement  St Jude Pacemaker implanted 2009, upgraded to CRT 08/14/2019 for LBBB and chronic systolic dysfunction History of appropriate therapy: No History of AAD therapy: No  LV lead History: Model: Quartet [id number] Location: Distal coronary sinus branch Threshold 0.75 V @ 0.5 ms Vector D1-M2 Revisions/CXR: Stable on post op CXR 08/15/2019 Diaphragmatic Stim: Not noted VV timing LV->RV 60 ms Prior optimzation/changes N/A  Echocardiogram: Pre-device implant: 08/13/2019 LVEF 35-40% Post-device implant: 11/27/18 LVEF 35-40%  EKG: Pre-device implant: 08/12/2019 QRS 146 Post-device implant: 08/15/2019 am QRS 136 ms  ICD interrogation: CRT pacing: >99% AF: 0% Thoracic impedence: slightly decreased today.  Activity Level: ~ 2 hours daily.  HR excursion: stable. See PaceArt report for full details.   EKG:  EKG is not ordered today. Consider VV optimization at later date based on response to AV optimization.   Assessment and Plan:  1.  Chronic systolic heart failure Patient reports improved status post CRT implant, but continues to have SOB with moderate exertion. Device interrogation today demonstrates stable device function.  Intrinsic PR interval 200 ms. Intrinsic PACED interval 281 ms.  AV delay was taken from initial of 190 ms, down by 10 ms increments. Showed improvement with a sharper A wave uptake and less truncation at 170 ms. No further improvement by decreasing down to 150 ms.   AV delay temporarily increased up to 225 ms which showed worsening waveforms.    AV optimization performed today.  Initial AV delay 190 ms -> new programming 170 ms. LV->RV 60 ms unchanged.   Disposition:   FU with EP-APP in 6 weeks for follow up  and VV optimization consideration as needed.  Jacalyn Lefevre, PA-C  12/26/2019 12:18 PM  [facility] [address] [place] [address] [phone] (office) [phone] (fax

## 2019-12-26 NOTE — Patient Instructions (Signed)
Medication Instructions:  Increase carvedilol to 6.25 TWICE Daily *If you need a refill on your cardiac medications before your next appointment, please call your pharmacy*  Lab Work: none If you have labs (blood work) drawn today and your tests are completely normal, you will receive your results only by: [name] MyChart Message (if you have MyChart) OR . A paper copy in the mail If you have any lab test that is abnormal or we need to change your treatment, we will call you to review the results.  Testing/Procedures: none  Follow-Up: At [facility], you and your health needs are our priority.  As part of our continuing mission to provide you with exceptional heart care, we have created designated Provider Care Teams.  These Care Teams include your primary Cardiologist (physician) and Advanced Practice Providers (APPs -  Physician Assistants and Nurse Practitioners) who all work together to provide you with the care you need, when you need it.  Your next appointment:   6 week(s)  The format for your next appointment:   In Person  Provider:   Oda Kilts, PA  Other Instructions

## 2019-12-27 LAB — CUP PACEART INCLINIC DEVICE CHECK
Date Time Interrogation Session: 20210203112058
Implantable Lead Implant Date: 20091006200000
Implantable Lead Implant Date: 20091006200000
Implantable Lead Implant Date: 20200921200000
Implantable Lead Location: 753858
Implantable Lead Location: 753859
Implantable Lead Location: 753860
Implantable Pulse Generator Implant Date: 20200921200000
Pulse Gen Serial Number: 9157656

## 2020-01-03 ENCOUNTER — Other Ambulatory Visit: Payer: Self-pay | Admitting: Endocrinology

## 2020-01-07 ENCOUNTER — Other Ambulatory Visit: Payer: Medicare Other

## 2020-01-09 ENCOUNTER — Other Ambulatory Visit: Payer: Self-pay | Admitting: Endocrinology

## 2020-01-11 ENCOUNTER — Other Ambulatory Visit: Payer: Self-pay | Admitting: Physician Assistant

## 2020-01-11 NOTE — Telephone Encounter (Signed)
Prescription refill request for Xarelto received.   Last office visit: 12/26/2019, [place] Weight: 126.1 kg Age: 74 y.o. Scr: 1.36, 08/15/2019 CrCl: 86 ml/min   Prescription refill sent.

## 2020-01-14 ENCOUNTER — Ambulatory Visit (INDEPENDENT_AMBULATORY_CARE_PROVIDER_SITE_OTHER): Payer: Medicare Other

## 2020-01-14 DIAGNOSIS — I5022 Chronic systolic (congestive) heart failure: Secondary | ICD-10-CM

## 2020-01-14 DIAGNOSIS — Z95810 Presence of automatic (implantable) cardiac defibrillator: Secondary | ICD-10-CM | POA: Diagnosis not present

## 2020-01-15 ENCOUNTER — Other Ambulatory Visit: Payer: Medicare Other

## 2020-01-16 ENCOUNTER — Other Ambulatory Visit: Payer: Self-pay

## 2020-01-16 ENCOUNTER — Other Ambulatory Visit (INDEPENDENT_AMBULATORY_CARE_PROVIDER_SITE_OTHER): Payer: Medicare Other

## 2020-01-16 DIAGNOSIS — E1165 Type 2 diabetes mellitus with hyperglycemia: Secondary | ICD-10-CM

## 2020-01-16 DIAGNOSIS — Z794 Long term (current) use of insulin: Secondary | ICD-10-CM | POA: Diagnosis not present

## 2020-01-16 LAB — COMPREHENSIVE METABOLIC PANEL
ALT: 15 U/L (ref 0–53)
AST: 12 U/L (ref 0–37)
Albumin: 3.9 g/dL (ref 3.5–5.2)
Alkaline Phosphatase: 48 U/L (ref 39–117)
BUN: 20 mg/dL (ref 6–23)
CO2: 23 mEq/L (ref 19–32)
Calcium: 9 mg/dL (ref 8.4–10.5)
Chloride: 107 mEq/L (ref 96–112)
Creatinine, Ser: 1.3 mg/dL (ref 0.40–1.50)
GFR: 54.1 mL/min — ABNORMAL LOW (ref >60.00)
Glucose, Bld: 161 mg/dL — ABNORMAL HIGH (ref 70–99)
Potassium: 4.2 mEq/L (ref 3.5–5.1)
Sodium: 139 mEq/L (ref 135–145)
Total Bilirubin: 0.3 mg/dL (ref 0.2–1.2)
Total Protein: 6.9 g/dL (ref 6.0–8.3)

## 2020-01-16 LAB — URINALYSIS, ROUTINE W REFLEX MICROSCOPIC
Bilirubin Urine: NEGATIVE
Hgb urine dipstick: NEGATIVE
Ketones, ur: NEGATIVE
Leukocytes,Ua: NEGATIVE
Nitrite: NEGATIVE
RBC / HPF: NONE SEEN (ref 0–?)
Specific Gravity, Urine: 1.02 (ref 1.000–1.030)
Total Protein, Urine: NEGATIVE
Urine Glucose: >=1000 — AB
Urobilinogen, UA: 0.2 (ref 0.0–1.0)
pH: 5 (ref 5.0–8.0)

## 2020-01-16 LAB — TSH: TSH: 1.06 u[IU]/mL (ref 0.35–4.50)

## 2020-01-16 LAB — MICROALBUMIN / CREATININE URINE RATIO
Creatinine,U: 59.8 mg/dL
Microalb Creat Ratio: 6 mg/g (ref 0.0–30.0)
Microalb, Ur: 3.6 mg/dL — ABNORMAL HIGH (ref 0.0–1.9)

## 2020-01-16 NOTE — Progress Notes (Signed)
EPIC Encounter for ICM Monitoring  Patient Name: Charles [name] is a 74 y.o. male Date: 01/16/2020 Primary Care Physican: Cyndi Bender, PA-C Primary Cardiologist: Claiborne Billings Electrophysiologist: [name] Bi-V Pacing:  >99%         12/12/2019 Weight: 278 lbs                                                            Heart Failure questions reviewed.  He is doing well and denies fluid symptoms.   Report: Thoracic impedance normal.  Prescribed: Furosemide 40 mg take 20 mg by mouth as needed   Recommendations: .  Encouraged to call if experiencing fluid symptoms.  Follow-up plan: ICM clinic phone appointment on 02/19/2020.   91 day device clinic remote transmission 02/18/2020.  Office appt 02/04/2020 with Oda Kilts, PA.    Copy of ICM check sent to Dr. [name].   3 month ICM trend: 01/14/2020    1 Year ICM trend:       Rosalene Billings, RN 01/16/2020 5:12 PM

## 2020-01-17 ENCOUNTER — Telehealth: Payer: Self-pay | Admitting: Endocrinology

## 2020-01-17 ENCOUNTER — Other Ambulatory Visit: Payer: Self-pay

## 2020-01-17 LAB — HEMOGLOBIN A1C: Hgb A1c MFr Bld: 6.5 % (ref 4.6–6.5)

## 2020-01-17 MED ORDER — FREESTYLE PRECISION NEO TEST VI STRP: ORAL_STRIP | 2 refills | Status: DC

## 2020-01-17 NOTE — Telephone Encounter (Signed)
Patient called stating his levels are now normal and wanted me to let Olen Cordial know.

## 2020-01-17 NOTE — Telephone Encounter (Signed)
Called pt and gave him MD message. Pt verbalized understanding. 

## 2020-01-17 NOTE — Telephone Encounter (Signed)
Called pt to check on him and he stated that his blood sugar did come up to 84, and he has been doing okay since then.

## 2020-01-17 NOTE — Telephone Encounter (Signed)
Pt called and stated that when he woke up this am, his blood sugar was 85, he took his medications and ate and has been doing yard work ever since. Pt came inside, checked his blood sugar and Freestyle libre stated "low" and pt had a chicken sandwich and a clementine. After eating this, Elenor Legato still read "low". Pt then called office. PT was first instructed to check his blood sugar by finger stick, but he does not have strips to do this. Pt stated that he was "not really" symptomatic but his head was a little "swimmy". Pt stated that he did not have any juice, glucose tablets, or sugar available in a hypoglycemic event. Pt did have jelly, which has 14g of carbs. Pt was instructed to eat 1 serving (1 tbsp) of jelly and wait 15 minutes and then recheck blood sugar.  Pt stated that he would do this and then call office back after his sugar comes up. Test strips sent to [facility] for [place] meter.

## 2020-01-17 NOTE — Telephone Encounter (Signed)
NA

## 2020-01-17 NOTE — Telephone Encounter (Signed)
If he is going to be very active he can reduce his NovoLog by 5 to 8 units for that meal.  His freestyle Elenor Legato was previously reading up to 30 mg lower than the actual reading and needs to confirm with fingersticks periodically

## 2020-01-18 ENCOUNTER — Other Ambulatory Visit: Payer: Self-pay

## 2020-01-18 ENCOUNTER — Encounter: Payer: Self-pay | Admitting: Endocrinology

## 2020-01-18 ENCOUNTER — Ambulatory Visit (INDEPENDENT_AMBULATORY_CARE_PROVIDER_SITE_OTHER): Payer: Medicare Other | Admitting: Endocrinology

## 2020-01-18 VITALS — BP 140/80 | HR 73 | Ht 69.0 in | Wt 288.4 lb

## 2020-01-18 DIAGNOSIS — E1165 Type 2 diabetes mellitus with hyperglycemia: Secondary | ICD-10-CM

## 2020-01-18 DIAGNOSIS — N1831 Chronic kidney disease, stage 3a: Secondary | ICD-10-CM | POA: Diagnosis not present

## 2020-01-18 DIAGNOSIS — Z794 Long term (current) use of insulin: Secondary | ICD-10-CM | POA: Diagnosis not present

## 2020-01-18 MED ORDER — FREESTYLE LIBRE 2 SENSOR MISC: 2.0000 | 3 refills | Status: DC

## 2020-01-18 MED ORDER — FREESTYLE LIBRE 2 READER DEVI: 1.0000 | Freq: Once | 0 refills | Status: AC
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
## 2020-01-18 NOTE — Patient Instructions (Addendum)
Check blood sugars on waking up 4-5 days a week  Also check blood sugars about 2 hours after meals and do this after different meals by rotation  Recommended blood sugar levels on waking up are 90-130 and about 2 hours after meal is 130-160  Please bring your blood sugar monitor to each visit, thank you   You may register to get the vaccine at either of the 2 options:  [place] website   [email or web address]  Telephone number: [phone], Option 2   Also can register at the [facility] health website, wait listing may be an option  [email or web address] or call [phone]

## 2020-01-28 ENCOUNTER — Other Ambulatory Visit: Payer: Self-pay

## 2020-01-28 MED ORDER — CARVEDILOL 6.25 MG PO TABS: 6.2500 mg | ORAL_TABLET | Freq: Two times a day (BID) | ORAL | 3 refills | Status: DC
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
## 2020-02-04 ENCOUNTER — Other Ambulatory Visit: Payer: Self-pay

## 2020-02-04 ENCOUNTER — Ambulatory Visit (INDEPENDENT_AMBULATORY_CARE_PROVIDER_SITE_OTHER): Payer: Medicare Other | Admitting: Student

## 2020-02-04 ENCOUNTER — Encounter: Payer: Self-pay | Admitting: Student

## 2020-02-04 ENCOUNTER — Telehealth: Payer: Self-pay | Admitting: Student

## 2020-02-04 VITALS — BP 124/60 | HR 71 | Ht 69.0 in | Wt 287.1 lb

## 2020-02-04 DIAGNOSIS — I5022 Chronic systolic (congestive) heart failure: Secondary | ICD-10-CM | POA: Diagnosis not present

## 2020-02-04 DIAGNOSIS — Z95810 Presence of automatic (implantable) cardiac defibrillator: Secondary | ICD-10-CM

## 2020-02-04 DIAGNOSIS — I2510 Atherosclerotic heart disease of native coronary artery without angina pectoris: Secondary | ICD-10-CM

## 2020-02-04 DIAGNOSIS — I10 Essential (primary) hypertension: Secondary | ICD-10-CM

## 2020-02-04 DIAGNOSIS — I480 Paroxysmal atrial fibrillation: Secondary | ICD-10-CM

## 2020-02-04 LAB — CUP PACEART INCLINIC DEVICE CHECK
Battery Remaining Longevity: 84 mo
Battery Voltage: 2.99 V
Brady Statistic RA Percent Paced: 95 %
Brady Statistic RV Percent Paced: 99.09 %
Date Time Interrogation Session: 20210315112301
Implantable Lead Implant Date: 20091007
Implantable Lead Implant Date: 20091007
Implantable Lead Implant Date: 20200922
Implantable Lead Location: 753858
Implantable Lead Location: 753859
Implantable Lead Location: 753860
Implantable Pulse Generator Implant Date: 20200922
Lead Channel Impedance Value: 1225 Ohm
Lead Channel Impedance Value: 437.5 Ohm
Lead Channel Impedance Value: 612.5 Ohm
Lead Channel Pacing Threshold Amplitude: 0.75 V
Lead Channel Pacing Threshold Amplitude: 0.75 V
Lead Channel Pacing Threshold Amplitude: 1 V
Lead Channel Pacing Threshold Amplitude: 1 V
Lead Channel Pacing Threshold Amplitude: 1.25 V
Lead Channel Pacing Threshold Amplitude: 1.25 V
Lead Channel Pacing Threshold Pulse Width: 0.4 ms
Lead Channel Pacing Threshold Pulse Width: 0.4 ms
Lead Channel Pacing Threshold Pulse Width: 0.4 ms
Lead Channel Pacing Threshold Pulse Width: 0.4 ms
Lead Channel Pacing Threshold Pulse Width: 0.6 ms
Lead Channel Pacing Threshold Pulse Width: 0.6 ms
Lead Channel Sensing Intrinsic Amplitude: 12 mV
Lead Channel Sensing Intrinsic Amplitude: 3 mV
Lead Channel Setting Pacing Amplitude: 2 V
Lead Channel Setting Pacing Amplitude: 2.25 V
Lead Channel Setting Pacing Amplitude: 2.5 V
Lead Channel Setting Pacing Pulse Width: 0.4 ms
Lead Channel Setting Pacing Pulse Width: 0.5 ms
Lead Channel Setting Sensing Sensitivity: 2 mV
Pulse Gen Model: 3562
Pulse Gen Serial Number: 9157656

## 2020-02-04 MED ORDER — CARVEDILOL 6.25 MG PO TABS: 12.5000 mg | ORAL_TABLET | Freq: Two times a day (BID) | ORAL | 3 refills | Status: DC

## 2020-02-04 NOTE — Telephone Encounter (Signed)
Follow Up:   Pt said he saw Charles [name] this morning.  He told him to call back and let him know how much Entresto he had left. He says he have 74 tablets left.

## 2020-02-04 NOTE — Patient Instructions (Signed)
Medication Instructions:  INCREASE CARVEDILOL to 12.5 mg TWICE Daily *If you need a refill on your cardiac medications before your next appointment, please call your pharmacy*   Lab Work: none If you have labs (blood work) drawn today and your tests are completely normal, you will receive your results only by: [name] MyChart Message (if you have MyChart) OR . A paper copy in the mail If you have any lab test that is abnormal or we need to change your treatment, we will call you to review the results.   Testing/Procedures: none   Follow-Up: At [facility], you and your health needs are our priority.  As part of our continuing mission to provide you with exceptional heart care, we have created designated Provider Care Teams.  These Care Teams include your primary Cardiologist (physician) and Advanced Practice Providers (APPs -  Physician Assistants and Nurse Practitioners) who all work together to provide you with the care you need, when you need it.  We recommend signing up for the patient portal called "MyChart".  Sign up information is provided on this After Visit Summary.  MyChart is used to connect with patients for Virtual Visits (Telemedicine).  Patients are able to view lab/test results, encounter notes, upcoming appointments, etc.  Non-urgent messages can be sent to your provider as well.   To learn more about what you can do with MyChart, go to [email or web address].    Your next appointment:   6 month(s)  The format for your next appointment:   In Person  Provider:   Oda Kilts, PA   Other Instructions Remote monitoring is used to monitor your Pacemaker from home. This monitoring reduces the number of office visits required to check your device to one time per year. It allows [place] to keep an eye on the functioning of your device to ensure it is working properly. You are scheduled for a device check from home on 02/18/20. You may send your transmission at any time that day.  If you have a wireless device, the transmission will be sent automatically. After your physician reviews your transmission, you will receive a postcard with your next transmission date.

## 2020-02-04 NOTE — Telephone Encounter (Signed)
Patient states that he has 74 tablets of Entresto left and he would like to finish taking these before his dose is increased.

## 2020-02-06 NOTE — Telephone Encounter (Signed)
Would like to increase his Entresto to 49/51 mg BID in 1 months time. As he has about 34 doses days of his current dose left and wishes to avoid paying for another batch.  Would plan to start 03/10/2020

## 2020-02-14 ENCOUNTER — Telehealth: Payer: Self-pay

## 2020-02-14 NOTE — Telephone Encounter (Signed)
Transmission reviewed. Normal BiV PPM function. No episodes. AP 94%, BiVP >99%. Presenting rhythm AP/BiVP 70s w/PACs. CorVue suggests no fluid accumulation.   Spoke with patient. He reports his SOB has improved some since his initial call. Sudden onset of SOB earlier today, denies chest discomfort, syncope, or sustained palpitations. Took SL nitro x2 (5 minutes apart) more than 30 minutes ago. Feels weak and shaky with a "touch" of nausea, but he is unsure if these symptoms were present prior to taking nitro. Reports CBG is "normal." BP after nitro 157/92, repeat BP while on the phone 156/92. Pt ambulated to bathroom during our conversation and reports that while he still felt weak and shaky, his SOB has improved some, but he still does not feel that SOB is at baseline.  Reports compliance with all medications. Took PRN furosemide 20mg  today. Explained normal transmission findings. Briefly reviewed with Dr. [name], not felt to be device-related. Noted after discussing with Dr. [name] that pt also follows with Dr. Claiborne Billings.   Called pt back. Reports dizziness is now only noticeable when he "turns [his] head too quickly" and SOB has mostly resolved. States dizziness may be related to vertigo. Explained will forward message to Dr. Claiborne Billings and NL triage team for further advisement regarding symptoms. ED precautions reviewed for any worsening or persistent symptoms, pt verbalizes understanding and agreement with plan.

## 2020-02-14 NOTE — Telephone Encounter (Signed)
The pt is having some SOB. The pt took a nitro 5 minutes ago. The pt did send a transmission. I told him as soon as the nurse review it she will give him a call back.The best number for the pt is [phone].

## 2020-02-15 NOTE — Telephone Encounter (Signed)
Agree, doubt dizziness with turning his head is not cardiac mediated

## 2020-02-15 NOTE — Telephone Encounter (Signed)
Called pt back to review Dr.Kelly's advice notified that Dr.Kelly did not think the dizziness with turning his head was cardiac related. Pt is still complaining of some nausea and a little breathlessness every once in a while but that it seems to be disappearing. He states he took a promethazine this morning and this has helped with the nausea. He reports that his breathlessness is not bad today but when it happened yesterday it was very sudden. He does not believe this to be Covid related and denies fever and cough. Also denies chest pain. States he has a runny nose but suffers from allergies. He states that he was told that his transmission had no issues. I notified to let his PA know as well to see if they have any advice. But that I would also let Dr.Kelly know and contact him if He had any other advice. Notified that if he begins to have more SOB or chest pain that he needs to seek immediate care in his nearest ER. Pt verbalized understanding. Stated he was going to "try to wait it out until Monday". Pt thankful for the call and had no further questions at this time.

## 2020-02-18 ENCOUNTER — Ambulatory Visit (INDEPENDENT_AMBULATORY_CARE_PROVIDER_SITE_OTHER): Payer: Medicare Other | Admitting: *Deleted

## 2020-02-18 DIAGNOSIS — I495 Sick sinus syndrome: Secondary | ICD-10-CM

## 2020-02-18 LAB — CUP PACEART REMOTE DEVICE CHECK
Battery Remaining Longevity: 88 mo
Battery Remaining Percentage: 95.5 %
Battery Voltage: 2.99 V
Brady Statistic AP VP Percent: 94 %
Brady Statistic AP VS Percent: 1 %
Brady Statistic AS VP Percent: 4.8 %
Brady Statistic AS VS Percent: 1 %
Brady Statistic RA Percent Paced: 94 %
Date Time Interrogation Session: 20210329084039
Implantable Lead Implant Date: 20091007
Implantable Lead Implant Date: 20091007
Implantable Lead Implant Date: 20200922
Implantable Lead Location: 753858
Implantable Lead Location: 753859
Implantable Lead Location: 753860
Implantable Pulse Generator Implant Date: 20200922
Lead Channel Impedance Value: 1200 Ohm
Lead Channel Impedance Value: 410 Ohm
Lead Channel Impedance Value: 640 Ohm
Lead Channel Pacing Threshold Amplitude: 0.75 V
Lead Channel Pacing Threshold Amplitude: 1 V
Lead Channel Pacing Threshold Amplitude: 1.25 V
Lead Channel Pacing Threshold Pulse Width: 0.4 ms
Lead Channel Pacing Threshold Pulse Width: 0.4 ms
Lead Channel Pacing Threshold Pulse Width: 0.6 ms
Lead Channel Sensing Intrinsic Amplitude: 12 mV
Lead Channel Sensing Intrinsic Amplitude: 3.5 mV
Lead Channel Setting Pacing Amplitude: 2 V
Lead Channel Setting Pacing Amplitude: 2.25 V
Lead Channel Setting Pacing Amplitude: 2.5 V
Lead Channel Setting Pacing Pulse Width: 0.4 ms
Lead Channel Setting Pacing Pulse Width: 0.5 ms
Lead Channel Setting Sensing Sensitivity: 2 mV
Pulse Gen Model: 3562
Pulse Gen Serial Number: 9157656

## 2020-02-18 NOTE — Telephone Encounter (Signed)
noted 

## 2020-02-18 NOTE — Telephone Encounter (Signed)
Agree with recommendations.  

## 2020-02-19 ENCOUNTER — Ambulatory Visit (INDEPENDENT_AMBULATORY_CARE_PROVIDER_SITE_OTHER): Payer: Medicare Other

## 2020-02-19 DIAGNOSIS — I5022 Chronic systolic (congestive) heart failure: Secondary | ICD-10-CM | POA: Diagnosis not present

## 2020-02-19 DIAGNOSIS — Z95810 Presence of automatic (implantable) cardiac defibrillator: Secondary | ICD-10-CM

## 2020-02-19 NOTE — Progress Notes (Signed)
PPM Remote  

## 2020-02-21 ENCOUNTER — Encounter: Payer: Self-pay | Admitting: Neurology

## 2020-02-22 ENCOUNTER — Telehealth: Payer: Self-pay

## 2020-02-22 NOTE — Progress Notes (Signed)
EPIC Encounter for ICM Monitoring  Patient Name: Charles [name] is a 74 y.o. male Date: 02/22/2020 Primary Care Physican: Cyndi Bender, PA-C Primary Cardiologist:Kelly Electrophysiologist:[name] Bi-V Pacing:>99% 12/12/2019 Weight:278lbs    Attempted call to patient and unable to reach.  Left detailed message per DPR regarding transmission. Transmission reviewed.   CorVue thoracic impedance normal.  Prescribed: Furosemide40 mg take 20 mg by mouth as needed  Recommendations:Left voice mail with ICM number and encouraged to call if experiencing any fluid symptoms.  Follow-up plan: ICM clinic phone appointment on5/02/2020. 91 day device clinic remote transmission 05/19/2020.   Copy of ICM check sent to Dr.[name].   3 month ICM trend: 02/18/2020    1 Year ICM trend:       Rosalene Billings, RN 02/22/2020 4:08 PM

## 2020-02-22 NOTE — Telephone Encounter (Signed)
Remote ICM transmission received.  Attempted call to patient regarding ICM remote transmission and left detailed message per DPR.  Advised to return call for any fluid symptoms or questions.  

## 2020-03-05 ENCOUNTER — Other Ambulatory Visit: Payer: Self-pay | Admitting: Endocrinology

## 2020-03-07 ENCOUNTER — Other Ambulatory Visit: Payer: Self-pay | Admitting: Cardiovascular Disease

## 2020-03-10 ENCOUNTER — Telehealth: Payer: Self-pay

## 2020-03-10 ENCOUNTER — Other Ambulatory Visit: Payer: Self-pay

## 2020-03-10 MED ORDER — ENTRESTO 49-51 MG PO TABS: 1.0000 | ORAL_TABLET | Freq: Two times a day (BID) | ORAL | 3 refills | Status: DC

## 2020-03-10 NOTE — Telephone Encounter (Signed)
I spoke to the patient and informed him that Charles [name] would like to increase his Entresto to 49/51 mg bid, effective 4/19.  He verbalized understanding.

## 2020-03-14 ENCOUNTER — Telehealth: Payer: Self-pay | Admitting: Cardiovascular Disease

## 2020-03-14 MED ORDER — AMIODARONE HCL 200 MG PO TABS: 200.0000 mg | ORAL_TABLET | Freq: Every day | ORAL | 3 refills | Status: DC

## 2020-03-14 NOTE — Telephone Encounter (Signed)
Pt c/o medication issue:  1. Name of Medication: amiodarone (PACERONE) 200 MG tablet  2. How are you currently taking this medication (dosage and times per day)? One tablet once a day  3. Are you having a reaction (difficulty breathing--STAT)? no  4. What is your medication issue? Patient states he was taking the medication twice a day and believed he is supposed to now start taking it one tablet once a day. He states the old instructions of taking the medication twice a day are still on it. He would like to know how he is supposed to take the medication.

## 2020-03-14 NOTE — Telephone Encounter (Signed)
Spoke with pt, aware according to the chart the twice daily amiodarone was supposed to be just for 14 days and now he should be taking 1 tablet once daily. New script sent to the pharmacy

## 2020-03-21 NOTE — Progress Notes (Signed)
Assessment/Plan:   1.  Tremor  -I did tell the patient that while he may have essential tremor (and likely does), because he is also on amiodarone it would be difficult to rule out that as a contributing source of tremor.  I told the patient that about 1/3 of patients who are on amiodarone will have some degree of tremor.  I did not advise the patient stop or alter the medication in any way, but did tell the patient that if the medication is stopped for any reason, it can take up to 6 months to know if the tremor was from the medication.    -Independent of the amiodarone, the patient and I discussed that I would not recommend treatment at this time.  Part of that is because his tremor is fairly mild.  Most of it is because of the fact that many of the tremor drugs would interact with current medications.  Primidone would interact with his Xarelto.  He is already on a beta-blocker, so propranolol would not be indicated.  He cannot have topiramate because of his long history of nephrolithiasis.  Age and neuropathy/ataxia would prevent anticholinergics.  Patient was agreeable to holding on treatment for right now.  2.  Gait instability  -Patient does have physical exam evidence of peripheral neuropathy, likely secondary to diabetes, even though that is well treated now.  Records indicate that this has been difficult to control in the past.  We discussed that exercise/physical therapy is really the only thing that helps this.  He does note that he did better when he was back at the gym, but stopped going at the beginning of the pandemic.  He is thinking about getting back.  He will let me know if he wants a referral to physical therapy.  -We will go ahead and check his B12 to make sure we are not missing that.  3.  We will follow up on an as-needed basis.   Subjective:   Charles [name] was seen today in the movement disorders clinic for neurologic consultation at the request of Cyndi Bender, Hershal Coria.   The consultation is for the evaluation of tremor, gait instability and to rule out Parkinson's.  Medical records made available to me have been reviewed.  Is a 74 year old male with history of difficult to control diabetes (last A1c was well-controlled at 6.5), atrial fibrillation on Xarelto who presents with gait instability and tremor.  Tremor: Yes.     How long has it been going on? About 1 year ago  At rest or with activation?  Activation  When is it noted the most?  When eating with fork/spoon as it approaches the mouth - worse with a light weight as opposed to holding a heavy weight  Fam hx of tremor?  Yes.  , Family history of essential tremor (mom and maternal aunt)  Located where?  Bilateral upper extremities  Affected by caffeine:  No. (only drinks 1 cup tea per day)  Affected by alcohol:  Doesn't drink EtOH now  Affected by stress:  No.  Affected by fatigue:  Yes.    Spills soup if on spoon:  No.  Spills glass of liquid if full:  Yes.  (and doesn't fill that full for that reason)  Affects ADL's (tying shoes, brushing teeth, etc):  No.  Tremor inducing meds: amiodarone  Other Specific Symptoms:  Voice: no Sleep: sleeps like baby on cpap  Vivid Dreams:  No.  Acting out dreams:  No.  Postural symptoms:  Yes.  , esp if turns fast.  Not sure if has DM neuropathy - "I have a little buzz in my feet but no pain".  Does have back issues.  Does put hands on walls/furniture when walks for balance  Falls?  Did fall down stairs about a year ago - missed step and fell and had hematoma and went to hospital at [facility] Bradykinesia symptoms: no bradykinesia noted Loss of smell:  No. Loss of taste:  No. Urinary Incontinence:  No. but has frequency and on med for that Difficulty Swallowing:  No. Handwriting, micrographia: No. but it is getting sloppier  Depression:  Yes.   and anxiety - related to house/house work and trying to upkeep the house Memory changes:  Yes.  , mild N/V:  Does have  some nausea after AM meal Lightheaded:  Yes.  , esp if BS gets too low and "everything will start to glow around the edges"  Syncope: No. Diplopia:  No.  Neuroimaging of the brain has not previously been performed in our system but reports had CT in [facility] hospital after fall a year ago.     ALLERGIES:   Allergies  Allergen Reactions  . Black Pepper [[place]] Other (See Comments)    Irritates back of throat  . Chocolate Hives, Shortness Of Breath and Swelling  . Codeine Itching  . Oxytetracycline Other (See Comments)    Flushing in sunlight  . Statins Other (See Comments)    Mental changes, muscle aches  . Latex Itching and Other (See Comments)    Sensitive skin  . Tape Rash and Other (See Comments)    SKIN IS VERY SENSITIVE!!    CURRENT MEDICATIONS:  Current Outpatient Medications  Medication Instructions  . alfuzosin (UROXATRAL) 10 mg, Oral, Every evening  . amiodarone (PACERONE) 200 mg, Oral, Daily  . aspirin 81 mg, Oral, Daily  . BD INSULIN SYRINGE U/F 30G X 1/2" 0.5 ML MISC USE TO INJECT INSULIN 3 TIMES DAILY  . carvedilol (COREG) 6.25 mg, Oral, 2 times daily with meals  . cetirizine (ZYRTEC) 10 mg, Oral, Daily PRN  . Continuous Blood Gluc Sensor (FREESTYLE LIBRE 14 DAY SENSOR) MISC USE 1 DEVICE BY TOPICAL ROUTE ROUTE EVERY 14 (FOURTEEN) DAYS. DX CODE E11.9  . Continuous Blood Gluc Sensor (FREESTYLE LIBRE 2 SENSOR) MISC 2 Devices, Does not apply, Every 14 days  . CoQ-10 400 mg, Oral, Daily  . dapagliflozin propanediol (FARXIGA) 2.5 mg, Oral, 2 times daily, Take 1 tablet by mouth once daily.  . diclofenac sodium (VOLTAREN) 1 % GEL 1 application, Topical, As needed  . ferrous gluconate (IRON 27) 120 mg, Oral, Daily  . furosemide (LASIX) 20 mg, Oral, As needed  . gabapentin (NEURONTIN) 300 MG capsule TAKE 1 CAPSULE BY MOUTH THREE TIMES A DAY  . glucose blood (FREESTYLE PRECISION NEO TEST) test strip Use Freestyle neo test strips as instructed to check blood sugar with libre  meter up to three times daily as needed.   . insulin aspart (NOVOLOG) 100 UNIT/ML injection INJECT 15-30 units under the skin three times daily before meals.  . Insulin Pen Needle 29G X 12.7MM MISC Use daily to inject Tresiba insulin   . metFORMIN (GLUCOPHAGE-XR) 500 mg, Oral, 3 times daily  . nitroGLYCERIN (NITROSTAT) 0.4 mg, Sublingual, Every 5 min PRN  . oxymetazoline (AFRIN) 0.05 % nasal spray 2 sprays, Each Nare, 2 times daily  . promethazine (PHENERGAN) 25 MG tablet 1 tablet, Oral, As needed  . rosuvastatin (  CRESTOR) 2.5 mg, Oral, Every other day  . sacubitril-valsartan (ENTRESTO) 49-51 MG 1 tablet, Oral, 2 times daily  . TRESIBA FLEXTOUCH 100 UNIT/ML SOPN FlexTouch Pen INJECT 40 UNITS (0.4MLS) INTO THE SKIN DAILY AS DIRECTED  . VENTOLIN HFA 108 (90 Base) MCG/ACT inhaler 2 puffs, Inhalation, 2 times daily PRN  . XARELTO 20 MG TABS tablet TAKE 1 TABLET BY MOUTH DAILY WITH SUPPER.    Objective:   PHYSICAL EXAMINATION:    VITALS:   Vitals:   03/24/20 0849  BP: 134/83  Pulse: 74  SpO2: 96%  Weight: 289 lb (131.1 kg)  Height: 5\' 9"  (1.753 m)    GEN:  The patient appears stated age and is in NAD. HEENT:  Normocephalic, atraumatic.  The mucous membranes are moist. The superficial temporal arteries are without ropiness or tenderness. CV:  RRR Lungs:  CTAB Neck/HEME:  There are no carotid bruits bilaterally.  Neurological examination:  Orientation: The patient is alert and oriented x3.  Cranial nerves: There is good facial symmetry.  Extraocular muscles are intact. The visual fields are full to confrontational testing. The speech is fluent and clear. Soft palate rises symmetrically and there is no tongue deviation. Hearing is intact to conversational tone. Sensation: Sensation is intact to light touch throughout (facial, trunk, extremities). Vibration is absent at the bilateral big toe and overall decreased distally. There is no extinction with double simultaneous stimulation.    Motor: Strength is 5/5 in the bilateral upper and lower extremities.   Shoulder shrug is equal and symmetric.  There is no pronator drift. Deep tendon reflexes: Deep tendon reflexes are 0-1/4 at the bilateral biceps, triceps, brachioradialis, patella and achilles. Plantar responses are downgoing bilaterally.  Movement examination: Tone: There is normal tone in the bilateral upper extremities.  The tone in the lower extremities is normal.  Abnormal movements: no rest tremor.  There is R>L postural tremor.  He has mild trouble with archimedes spirals and the L actually shows more tremor than the R.  he has no difficulty when asked to pour a full glass of water from one glass to another but there is some tremor evident. Coordination:  There is no decremation with RAM's, with any form of RAMS, including alternating supination and pronation of the forearm, hand opening and closing, finger taps, heel taps and toe taps. Gait and Station: The patient has no difficulty arising out of a deep-seated chair without the use of the hands. The patient's stride length is good but he is wide based and somewhat slow.     I have reviewed and interpreted the following labs independently   Chemistry      Component Value Date/Time   NA 139 01/16/2020 1034   NA 141 08/07/2018 0912   K 4.2 01/16/2020 1034   CL 107 01/16/2020 1034   CO2 23 01/16/2020 1034   BUN 20 01/16/2020 1034   BUN 20 08/07/2018 0912   CREATININE 1.30 01/16/2020 1034   CREATININE 1.30 (H) 10/06/2016 1444      Component Value Date/Time   CALCIUM 9.0 01/16/2020 1034   ALKPHOS 48 01/16/2020 1034   AST 12 01/16/2020 1034   ALT 15 01/16/2020 1034   BILITOT 0.3 01/16/2020 1034      Lab Results  Component Value Date   TSH 1.06 01/16/2020   Lab Results  Component Value Date   WBC 7.5 08/12/2019   HGB 14.5 08/12/2019   HCT 44.3 08/12/2019   MCV 94.7 08/12/2019   PLT 188  08/12/2019   No results found for: VITAMINB12   Patient also  had lab work done through primary care on February 19, 2020.  Sodium was 140, potassium 4.7, chloride 105, CO2 21, BUN 16, creatinine 1.15, glucose 108, TSH 1.410.  AST 16, ALT 13, white blood cells 5.2, hemoglobin 15.2, hematocrit 45.1 and platelets 175.   Total time spent on today's visit was 45 minutes, including both face-to-face time and nonface-to-face time.  Time included that spent on review of records (prior notes available to me/labs/imaging if pertinent), discussing treatment and goals, answering patient's questions and coordinating care.  Cc:  Cyndi Bender, PA-C

## 2020-03-24 ENCOUNTER — Encounter: Payer: Self-pay | Admitting: Neurology

## 2020-03-24 ENCOUNTER — Other Ambulatory Visit (INDEPENDENT_AMBULATORY_CARE_PROVIDER_SITE_OTHER): Payer: Medicare Other

## 2020-03-24 ENCOUNTER — Ambulatory Visit (INDEPENDENT_AMBULATORY_CARE_PROVIDER_SITE_OTHER): Payer: Medicare Other | Admitting: Neurology

## 2020-03-24 ENCOUNTER — Other Ambulatory Visit: Payer: Self-pay | Admitting: Endocrinology

## 2020-03-24 ENCOUNTER — Other Ambulatory Visit: Payer: Self-pay

## 2020-03-24 VITALS — BP 134/83 | HR 74 | Ht 69.0 in | Wt 289.0 lb

## 2020-03-24 DIAGNOSIS — R6889 Other general symptoms and signs: Secondary | ICD-10-CM | POA: Diagnosis not present

## 2020-03-24 DIAGNOSIS — I2510 Atherosclerotic heart disease of native coronary artery without angina pectoris: Secondary | ICD-10-CM

## 2020-03-24 DIAGNOSIS — R5383 Other fatigue: Secondary | ICD-10-CM

## 2020-03-24 DIAGNOSIS — G250 Essential tremor: Secondary | ICD-10-CM | POA: Diagnosis not present

## 2020-03-24 DIAGNOSIS — E1142 Type 2 diabetes mellitus with diabetic polyneuropathy: Secondary | ICD-10-CM

## 2020-03-24 LAB — VITAMIN B12: Vitamin B-12: 178 pg/mL — ABNORMAL LOW (ref 211–911)

## 2020-03-24 NOTE — Patient Instructions (Signed)
Good to see you today!  We decided to hold on treatment for tremor.  Your amiodarone could be contributing.  If it gets worse in the future, you should let me know.   You can let me know if you want a referral to PT for your balance.  The physicians and staff at [facility] are committed to providing excellent care. You may receive a survey requesting feedback about your experience at our office. We strive to receive "very good" responses to the survey questions. If you feel that your experience would prevent you from giving the office a "very good " response, please contact our office to try to remedy the situation. We may be reached at [phone]. Thank you for taking the time out of your busy day to complete the survey.

## 2020-03-25 ENCOUNTER — Other Ambulatory Visit: Payer: Self-pay

## 2020-03-25 ENCOUNTER — Telehealth: Payer: Self-pay | Admitting: Neurology

## 2020-03-25 ENCOUNTER — Ambulatory Visit (INDEPENDENT_AMBULATORY_CARE_PROVIDER_SITE_OTHER): Payer: Medicare Other

## 2020-03-25 DIAGNOSIS — I5022 Chronic systolic (congestive) heart failure: Secondary | ICD-10-CM

## 2020-03-25 DIAGNOSIS — Z95810 Presence of automatic (implantable) cardiac defibrillator: Secondary | ICD-10-CM

## 2020-03-25 MED ORDER — FARXIGA 5 MG PO TABS: 5.0000 mg | ORAL_TABLET | Freq: Every day | ORAL | 2 refills | Status: DC

## 2020-03-25 NOTE — Telephone Encounter (Signed)
Patient left message with [employer] 03/24/20 @ 7:13pm.  "Caller states he is returning a call from the office about his test results."

## 2020-03-25 NOTE — Telephone Encounter (Signed)
Called patients with results.

## 2020-03-26 NOTE — Progress Notes (Signed)
EPIC Encounter for ICM Monitoring  Patient Name: Charles [name] is a 73 y.o. male Date: 03/26/2020 Primary Care Physican: Cyndi Bender, PA-C Primary Cardiologist:Kelly Electrophysiologist:[name] Bi-V Pacing:>99% 03/24/2020 Office Weight:289lbs   AT/AF Burden: 0%   Spoke with patient and reports feeling well at this time.  Denies fluid symptoms.    CorVue thoracic impedance normal but was suggesting possible fluid accumulation 4/28 - 5/2.  Prescribed:  Furosemide40 mg take 20 mg by mouth as needed Xarelto 20 mg take 1 tablet at supper daily  Recommendations: No changes and encouraged to call if experiencing any fluid symptoms.  Follow-up plan: ICM clinic phone appointment on6/05/2020. 91 day device clinic remote transmission 05/19/2020.   Copy of ICM check sent to Dr.[name].  3 month ICM trend: 03/25/2020    1 Year ICM trend:       Rosalene Billings, RN 03/26/2020 9:23 AM

## 2020-04-09 ENCOUNTER — Other Ambulatory Visit: Payer: Self-pay | Admitting: Endocrinology

## 2020-04-16 ENCOUNTER — Other Ambulatory Visit: Payer: Medicare Other

## 2020-04-18 ENCOUNTER — Ambulatory Visit: Payer: Medicare Other | Admitting: Endocrinology

## 2020-04-18 ENCOUNTER — Telehealth: Payer: Self-pay

## 2020-04-18 NOTE — Telephone Encounter (Signed)
Patient called in regards to making sure his remote transmission was received. Merlin transmission received, reviewed and no significant findings noted. Patient reports of feeling fatigued after working outside for the past few days and shortness of breath with exhertion. Rate Response currently programed on with a max track rate of 120 bpm. Currently compliant with Lasix.  Also report of "shaking or tremors" in hands. States he was wondering if this was a possible side effect from Amiodarone. Currently taking Amiodarone 200 mg daily. Informed patient I will forward this information to Dr. [name] and if he has any further questions or concerns to please call [facility] back. Verbalizes understanding.

## 2020-04-22 MED ORDER — AMIODARONE HCL 100 MG PO TABS
100.0000 mg | ORAL_TABLET | Freq: Every day | ORAL | 3 refills | Status: DC
Start: 2020-04-22 — End: 2021-04-06

## 2020-04-22 NOTE — Telephone Encounter (Signed)
°  In PCP note his tremors are described as mild.   He can continue current course until follow up in September and we can discuss further, OR we can decrease amiodarone to 100 mg daily for maintenance if he would like (may or may not help his tremors).

## 2020-04-22 NOTE — Telephone Encounter (Signed)
I spoke to the patient and he would like to decrease his Amiodarone to 100 mg Daily and monitor symptoms.  He will update [place] in the next few weeks.

## 2020-04-28 ENCOUNTER — Ambulatory Visit (INDEPENDENT_AMBULATORY_CARE_PROVIDER_SITE_OTHER): Payer: Medicare Other

## 2020-04-28 DIAGNOSIS — I5022 Chronic systolic (congestive) heart failure: Secondary | ICD-10-CM | POA: Diagnosis not present

## 2020-04-28 DIAGNOSIS — Z95810 Presence of automatic (implantable) cardiac defibrillator: Secondary | ICD-10-CM | POA: Diagnosis not present

## 2020-04-30 ENCOUNTER — Telehealth: Payer: Self-pay

## 2020-04-30 NOTE — Progress Notes (Signed)
EPIC Encounter for ICM Monitoring  Patient Name: MAIJOR HORNIG is a 74 y.o. male Date: 04/30/2020 Primary Care Physican: Cyndi Bender, PA-C Primary Cardiologist:Kelly Electrophysiologist:[name] Bi-V Pacing:>99% 03/24/2020 Office Weight:289lbs   AT/AF Burden: 0%   Attempted call to patient and unable to reach.  Left detailed message per DPR regarding transmission. Transmission reviewed.   CorVue thoracic impedance normal.  Prescribed:  Furosemide40 mg take 20 mg by mouth as needed Xarelto 20 mg take 1 tablet at supper daily  Recommendations: Left voice mail with ICM number and encouraged to call if experiencing any fluid symptoms.  Follow-up plan: ICM clinic phone appointment on 06/02/2020. 91 day device clinic remote transmission6/28/2021.   Copy of ICM check sent to Dr.[name].  3 month ICM trend: 04/28/2020    1 Year ICM trend:       Rosalene Billings, RN 04/30/2020 10:00 AM

## 2020-04-30 NOTE — Telephone Encounter (Signed)
Remote ICM transmission received.  Attempted call to patient regarding ICM remote transmission and left detailed message per DPR.  Advised to return call for any fluid symptoms or questions. Next ICM remote transmission scheduled 06/02/2020.     

## 2020-05-08 ENCOUNTER — Other Ambulatory Visit: Payer: Self-pay

## 2020-05-08 ENCOUNTER — Other Ambulatory Visit (INDEPENDENT_AMBULATORY_CARE_PROVIDER_SITE_OTHER): Payer: Medicare Other

## 2020-05-08 DIAGNOSIS — E1165 Type 2 diabetes mellitus with hyperglycemia: Secondary | ICD-10-CM | POA: Diagnosis not present

## 2020-05-08 DIAGNOSIS — Z794 Long term (current) use of insulin: Secondary | ICD-10-CM | POA: Diagnosis not present

## 2020-05-08 LAB — COMPREHENSIVE METABOLIC PANEL
ALT: 14 U/L (ref 0–53)
AST: 11 U/L (ref 0–37)
Albumin: 4 g/dL (ref 3.5–5.2)
Alkaline Phosphatase: 43 U/L (ref 39–117)
BUN: 21 mg/dL (ref 6–23)
CO2: 25 mEq/L (ref 19–32)
Calcium: 8.5 mg/dL (ref 8.4–10.5)
Chloride: 106 mEq/L (ref 96–112)
Creatinine, Ser: 1.22 mg/dL (ref 0.40–1.50)
GFR: 58.17 mL/min — ABNORMAL LOW (ref >60.00)
Glucose, Bld: 189 mg/dL — ABNORMAL HIGH (ref 70–99)
Potassium: 4.4 mEq/L (ref 3.5–5.1)
Sodium: 137 mEq/L (ref 135–145)
Total Bilirubin: 0.4 mg/dL (ref 0.2–1.2)
Total Protein: 6.9 g/dL (ref 6.0–8.3)

## 2020-05-08 LAB — HEMOGLOBIN A1C: Hgb A1c MFr Bld: 6.5 % (ref 4.6–6.5)

## 2020-05-13 ENCOUNTER — Ambulatory Visit (INDEPENDENT_AMBULATORY_CARE_PROVIDER_SITE_OTHER): Payer: Medicare Other | Admitting: Endocrinology

## 2020-05-13 ENCOUNTER — Other Ambulatory Visit: Payer: Self-pay

## 2020-05-13 ENCOUNTER — Encounter: Payer: Self-pay | Admitting: Endocrinology

## 2020-05-13 VITALS — BP 138/78 | HR 70 | Ht 69.0 in | Wt 293.0 lb

## 2020-05-13 DIAGNOSIS — I10 Essential (primary) hypertension: Secondary | ICD-10-CM | POA: Diagnosis not present

## 2020-05-13 DIAGNOSIS — E1165 Type 2 diabetes mellitus with hyperglycemia: Secondary | ICD-10-CM

## 2020-05-13 DIAGNOSIS — I2510 Atherosclerotic heart disease of native coronary artery without angina pectoris: Secondary | ICD-10-CM | POA: Diagnosis not present

## 2020-05-13 DIAGNOSIS — Z794 Long term (current) use of insulin: Secondary | ICD-10-CM | POA: Diagnosis not present

## 2020-05-13 LAB — POCT GLYCOSYLATED HEMOGLOBIN (HGB A1C)

## 2020-05-13 MED ORDER — FREESTYLE PRECISION NEO TEST VI STRP: ORAL_STRIP | 2 refills | Status: DC
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
## 2020-05-19 ENCOUNTER — Ambulatory Visit (INDEPENDENT_AMBULATORY_CARE_PROVIDER_SITE_OTHER): Payer: Medicare Other | Admitting: *Deleted

## 2020-05-19 DIAGNOSIS — I495 Sick sinus syndrome: Secondary | ICD-10-CM | POA: Diagnosis not present

## 2020-05-19 LAB — CUP PACEART REMOTE DEVICE CHECK
Battery Remaining Longevity: 93 mo
Battery Remaining Percentage: 95.5 %
Battery Voltage: 2.99 V
Brady Statistic AP VP Percent: 97 %
Brady Statistic AP VS Percent: 1 %
Brady Statistic AS VP Percent: 2.4 %
Brady Statistic AS VS Percent: 1 %
Brady Statistic RA Percent Paced: 96 %
Date Time Interrogation Session: 20210628020015
Implantable Lead Implant Date: 20091007
Implantable Lead Implant Date: 20091007
Implantable Lead Implant Date: 20200922
Implantable Lead Location: 753858
Implantable Lead Location: 753859
Implantable Lead Location: 753860
Implantable Pulse Generator Implant Date: 20200922
Lead Channel Impedance Value: 1250 Ohm
Lead Channel Impedance Value: 440 Ohm
Lead Channel Impedance Value: 640 Ohm
Lead Channel Pacing Threshold Amplitude: 0.75 V
Lead Channel Pacing Threshold Amplitude: 1 V
Lead Channel Pacing Threshold Amplitude: 1.25 V
Lead Channel Pacing Threshold Pulse Width: 0.4 ms
Lead Channel Pacing Threshold Pulse Width: 0.4 ms
Lead Channel Pacing Threshold Pulse Width: 0.6 ms
Lead Channel Sensing Intrinsic Amplitude: 1.2 mV
Lead Channel Sensing Intrinsic Amplitude: 12 mV
Lead Channel Setting Pacing Amplitude: 2 V
Lead Channel Setting Pacing Amplitude: 2.25 V
Lead Channel Setting Pacing Amplitude: 2.5 V
Lead Channel Setting Pacing Pulse Width: 0.4 ms
Lead Channel Setting Pacing Pulse Width: 0.5 ms
Lead Channel Setting Sensing Sensitivity: 2 mV
Pulse Gen Model: 3562
Pulse Gen Serial Number: 9157656

## 2020-05-20 NOTE — Progress Notes (Signed)
Remote pacemaker transmission.   

## 2020-06-02 ENCOUNTER — Ambulatory Visit (INDEPENDENT_AMBULATORY_CARE_PROVIDER_SITE_OTHER): Payer: Medicare Other

## 2020-06-02 DIAGNOSIS — Z95810 Presence of automatic (implantable) cardiac defibrillator: Secondary | ICD-10-CM

## 2020-06-02 DIAGNOSIS — I5022 Chronic systolic (congestive) heart failure: Secondary | ICD-10-CM

## 2020-06-04 ENCOUNTER — Telehealth: Payer: Self-pay

## 2020-06-04 NOTE — Progress Notes (Signed)
EPIC Encounter for ICM Monitoring  Patient Name: Charles [name] is a 74 y.o. male Date: 06/04/2020 Primary Care Physican: Cyndi Bender, PA-C Primary Cardiologist:Kelly Electrophysiologist:[name] Bi-V Pacing:>99% 5/3/2021OfficeWeight:289lbs  AT/AF Burden: 0%   Attempted call to patient and unable to reach.  Left detailed message per DPR regarding transmission. Transmission reviewed.   CorVue thoracic impedance trending close to baseline normal.  Prescribed:  Furosemide40 mg take 20 mg by mouth as needed Xarelto 20 mg take 1 tablet at supper daily  Recommendations:Left voice mail with ICM number and encouraged to call if experiencing any fluid symptoms.  Follow-up plan: ICM clinic phone appointment on 07/07/2020. 91 day device clinic remote transmission9/27/2021.   EP/Cardiology Office Visits: 08/12/2020 with Oda Kilts, PA.    Copy of ICM check sent to Dr. [name].   3 month ICM trend: 06/02/2020    1 Year ICM trend:       Rosalene Billings, RN 06/04/2020 2:37 PM

## 2020-06-04 NOTE — Telephone Encounter (Signed)
Remote ICM transmission received.  Attempted call to patient regarding ICM remote transmission and left detailed message per DPR.  Advised to return call for any fluid symptoms or questions. Next ICM remote transmission scheduled 07/07/2020.

## 2020-06-10 ENCOUNTER — Other Ambulatory Visit: Payer: Self-pay | Admitting: Physician Assistant

## 2020-06-10 ENCOUNTER — Other Ambulatory Visit: Payer: Self-pay | Admitting: Endocrinology

## 2020-06-10 NOTE — Telephone Encounter (Signed)
Xarelto 20mg  refill request received. Pt is 74 years old, weight-132.9kg, Crea-1.22 on 05/08/2020, last seen by Oda Kilts 02/04/2020, Diagnosis-Afib, CrCl-101.65ml/min; Dose is appropriate based on dosing criteria. Will send in refill to requested pharmacy.

## 2020-06-15 ENCOUNTER — Other Ambulatory Visit: Payer: Self-pay | Admitting: Endocrinology

## 2020-06-28 ENCOUNTER — Other Ambulatory Visit: Payer: Self-pay | Admitting: Endocrinology

## 2020-06-30 ENCOUNTER — Other Ambulatory Visit: Payer: Self-pay | Admitting: *Deleted

## 2020-07-01 ENCOUNTER — Other Ambulatory Visit: Payer: Self-pay | Admitting: Cardiovascular Disease

## 2020-07-01 NOTE — Telephone Encounter (Signed)
*  STAT* If patient is at the pharmacy, call can be transferred to refill team.   1. Which medications need to be refilled? (please list name of each medication and dose if known) new prescription for Nitroglycerin  2. Which pharmacy/location (including street and city if local pharmacy) is medication to be sent to?[facility]  3. Do they need a 30 day or 90 day supply?

## 2020-07-07 ENCOUNTER — Ambulatory Visit (INDEPENDENT_AMBULATORY_CARE_PROVIDER_SITE_OTHER): Payer: Medicare Other

## 2020-07-07 DIAGNOSIS — I5022 Chronic systolic (congestive) heart failure: Secondary | ICD-10-CM | POA: Diagnosis not present

## 2020-07-07 DIAGNOSIS — Z95810 Presence of automatic (implantable) cardiac defibrillator: Secondary | ICD-10-CM

## 2020-07-07 LAB — HM DIABETES EYE EXAM

## 2020-07-09 ENCOUNTER — Encounter: Payer: Self-pay | Admitting: *Deleted

## 2020-07-09 MED ORDER — NITROGLYCERIN 0.4 MG SL SUBL: 0.4000 mg | SUBLINGUAL_TABLET | SUBLINGUAL | 2 refills | Status: DC | PRN

## 2020-07-09 NOTE — Telephone Encounter (Signed)
Patient calling to check on status of his refill.

## 2020-07-09 NOTE — Telephone Encounter (Signed)
Rx has been sent to the pharmacy electronically. ° °

## 2020-07-11 NOTE — Progress Notes (Signed)
EPIC Encounter for ICM Monitoring  Patient Name: Charles [name] is a 74 y.o. male Date: 07/11/2020 Primary Care Physican: Cyndi Bender, PA-C Primary Cardiologist:Kelly Electrophysiologist:[name] Bi-V Pacing:99% 5/3/2021OfficeWeight:289lbs  AT/AF Burden: 0%  Transmission reviewed.  CorVue thoracic impedance trending close to baseline normal.  Prescribed:  Furosemide40 mg take 20 mg by mouth as needed Xarelto 20 mg take 1 tablet at supper daily  Recommendations:No changes.  Follow-up plan: ICM clinic phone appointment on9/20/2021. 91 day device clinic remote transmission9/27/2021.   EP/Cardiology Office Visits: 08/04/2020 with Oda Kilts, PA.    Copy of ICM check sent to Dr. [name].   3 month ICM trend: 07/07/2020    1 Year ICM trend:       Rosalene Billings, RN 07/11/2020 2:49 PM

## 2020-07-25 ENCOUNTER — Other Ambulatory Visit: Payer: Self-pay

## 2020-07-25 MED ORDER — FUROSEMIDE 40 MG PO TABS
20.0000 mg | ORAL_TABLET | ORAL | 3 refills | Status: DC | PRN
Start: 2020-07-25 — End: 2022-02-12

## 2020-07-25 NOTE — Telephone Encounter (Signed)
This is Dr. Kelly's pt. °

## 2020-07-29 ENCOUNTER — Telehealth: Payer: Self-pay | Admitting: Internal Medicine

## 2020-07-29 NOTE — Telephone Encounter (Signed)
Assisted patient with sending manual transmission.  There does not appear to be any heart rate issues that would explain pt symptoms occurring both today (after breakfast) and yesterday (late morning).    He did have an episode recorded as PMT at 10:02 yesterday

## 2020-07-29 NOTE — Telephone Encounter (Signed)
Could ya'll please have him send a manual transmission?

## 2020-07-29 NOTE — Telephone Encounter (Signed)
Patient returning call. He wants to know if the transmission went through.

## 2020-07-29 NOTE — Telephone Encounter (Signed)
BP reading: 11:20   127/49  HR 72 11:23   96/48 11:40   93/54 11:50   125/60   HR 76  12:05   118/45   HR 76 12:30   142/58    12:45   148/52   HR 73   Patient stated that he started suddenly feeling bad as if he was going to pass out. He was sitting at his computer reading emails.  Yesterday 9/6 similar episode occurred standing outside with his neighbor talking.  Only medication change is doxycycline hyclate 50 mg.   Will forward to Dr. [name], Oda Kilts PA, and Pharmacy for advisement.  Of note Jonni Sanger sees the patient on 08/04/20.

## 2020-07-29 NOTE — Telephone Encounter (Signed)
Pt c/o BP issue: STAT if pt c/o blurred vision, one-sided weakness or slurred speech  1. What are your last 5 BP readings?  9/7 11:20am 127/49 11:23am 96/48 11:40am 93/54  2. Are you having any other symptoms (ex. Dizziness, headache, blurred vision, passed out)? He did feel like headed like he was going to pass out, and sick to his stomach.   3. What is your BP issue? He is concerned about the low BP readings.

## 2020-07-29 NOTE — Telephone Encounter (Signed)
LMOVM for pt to send manual transmission or return my call.

## 2020-07-30 NOTE — Telephone Encounter (Signed)
Patient notified of recommendation to hold Lasix x 2 days. Patient reports he only takes his Lasix prn and has not taken any for the past 5 days. Encouraged patient to weigh daily and to change position slowly due to dizziness. Confirmed appointment for 08/04/20 with Gretchen Short PA . ED precautions provided.

## 2020-07-30 NOTE — Telephone Encounter (Signed)
If patients weight is stable would have him hold lasix x 2 days with soft BPs at times and lightheadedness.    Nothing on device/HR rise to describe symptoms.  Please encourage him to to keep his appointment for next week.   Legrand Como [address]" [place], PA-C  07/30/2020 12:55 PM

## 2020-08-04 ENCOUNTER — Encounter: Payer: Self-pay | Admitting: Student

## 2020-08-04 ENCOUNTER — Ambulatory Visit (INDEPENDENT_AMBULATORY_CARE_PROVIDER_SITE_OTHER): Payer: Medicare Other | Admitting: Student

## 2020-08-04 ENCOUNTER — Other Ambulatory Visit: Payer: Self-pay

## 2020-08-04 VITALS — BP 146/80 | HR 70 | Ht 69.0 in | Wt 298.0 lb

## 2020-08-04 DIAGNOSIS — I2510 Atherosclerotic heart disease of native coronary artery without angina pectoris: Secondary | ICD-10-CM | POA: Diagnosis not present

## 2020-08-04 DIAGNOSIS — I10 Essential (primary) hypertension: Secondary | ICD-10-CM

## 2020-08-04 DIAGNOSIS — I5022 Chronic systolic (congestive) heart failure: Secondary | ICD-10-CM

## 2020-08-04 DIAGNOSIS — Z95810 Presence of automatic (implantable) cardiac defibrillator: Secondary | ICD-10-CM | POA: Diagnosis not present

## 2020-08-04 DIAGNOSIS — Z79899 Other long term (current) drug therapy: Secondary | ICD-10-CM

## 2020-08-04 NOTE — Patient Instructions (Signed)
Medication Instructions:  *If you need a refill on your cardiac medications before your next appointment, please call your pharmacy*  Lab Work: Your physician has recommended that you have lab work today: CMET, CBC, TSH  If you have labs (blood work) drawn today and your tests are completely normal, you will receive your results only by: [name] MyChart Message (if you have MyChart) OR . A paper copy in the mail If you have any lab test that is abnormal or we need to change your treatment, we will call you to review the results.  Follow-Up: At [facility], you and your health needs are our priority.  As part of our continuing mission to provide you with exceptional heart care, we have created designated Provider Care Teams.  These Care Teams include your primary Cardiologist (physician) and Advanced Practice Providers (APPs -  Physician Assistants and Nurse Practitioners) who all work together to provide you with the care you need, when you need it.  We recommend signing up for the patient portal called "MyChart".  Sign up information is provided on this After Visit Summary.  MyChart is used to connect with patients for Virtual Visits (Telemedicine).  Patients are able to view lab/test results, encounter notes, upcoming appointments, etc.  Non-urgent messages can be sent to your provider as well.   To learn more about what you can do with MyChart, go to [email or web address].    Your next appointment:   Your physician wants you to follow-up in: 6 MONTHS with Oda Kilts, PA-C. You will receive a reminder letter in the mail two months in advance. If you don't receive a letter, please call our office to schedule the follow-up appointment.  Remote monitoring is used to monitor your ICD from home. This monitoring reduces the number of office visits required to check your device to one time per year. It allows [place] to keep an eye on the functioning of your device to ensure it is working properly. You  are scheduled for a device check from home on 08/18/20. You may send your transmission at any time that day. If you have a wireless device, the transmission will be sent automatically. After your physician reviews your transmission, you will receive a postcard with your next transmission date.  The format for your next appointment:   In Person with Legrand Como "Jonni Sanger" Chalmers Cater, PA-C
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
## 2020-08-05 LAB — CBC WITH DIFFERENTIAL/PLATELET
Basophils Absolute: 0 10*3/uL (ref 0.0–0.2)
Basos: 1 %
EOS (ABSOLUTE): 0.3 10*3/uL (ref 0.0–0.4)
Eos: 4 %
Hematocrit: 44.8 % (ref 37.5–51.0)
Hemoglobin: 14.9 g/dL (ref 13.0–17.7)
Immature Grans (Abs): 0 10*3/uL (ref 0.0–0.1)
Immature Granulocytes: 0 %
Lymphocytes Absolute: 1.2 10*3/uL (ref 0.7–3.1)
Lymphs: 19 %
MCH: 30.9 pg (ref 26.6–33.0)
MCHC: 33.3 g/dL (ref 31.5–35.7)
MCV: 93 fL (ref 79–97)
Monocytes Absolute: 0.4 10*3/uL (ref 0.1–0.9)
Monocytes: 7 %
Neutrophils Absolute: 4.3 10*3/uL (ref 1.4–7.0)
Neutrophils: 69 %
Platelets: 197 10*3/uL (ref 150–450)
RBC: 4.82 x10E6/uL (ref 4.14–5.80)
RDW: 13.1 % (ref 11.6–15.4)
WBC: 6.2 10*3/uL (ref 3.4–10.8)

## 2020-08-05 LAB — COMPREHENSIVE METABOLIC PANEL
ALT: 11 IU/L (ref 0–44)
AST: 10 IU/L (ref 0–40)
Albumin/Globulin Ratio: 1.6 (ref 1.2–2.2)
Albumin: 4.2 g/dL (ref 3.7–4.7)
Alkaline Phosphatase: 66 IU/L (ref 44–121)
BUN/Creatinine Ratio: 12 (ref 10–24)
BUN: 16 mg/dL (ref 8–27)
Bilirubin Total: 0.3 mg/dL (ref 0.0–1.2)
CO2: 22 mmol/L (ref 20–29)
Calcium: 8.9 mg/dL (ref 8.6–10.2)
Chloride: 106 mmol/L (ref 96–106)
Creatinine, Ser: 1.32 mg/dL — ABNORMAL HIGH (ref 0.76–1.27)
GFR calc Af Amer: 61 mL/min/{1.73_m2} (ref >59)
GFR calc non Af Amer: 53 mL/min/{1.73_m2} — ABNORMAL LOW (ref >59)
Globulin, Total: 2.6 g/dL (ref 1.5–4.5)
Glucose: 166 mg/dL — ABNORMAL HIGH (ref 65–99)
Potassium: 4.9 mmol/L (ref 3.5–5.2)
Sodium: 141 mmol/L (ref 134–144)
Total Protein: 6.8 g/dL (ref 6.0–8.5)

## 2020-08-05 LAB — TSH: TSH: 1.34 u[IU]/mL (ref 0.450–4.500)

## 2020-08-11 ENCOUNTER — Other Ambulatory Visit: Payer: Medicare Other

## 2020-08-11 ENCOUNTER — Ambulatory Visit (INDEPENDENT_AMBULATORY_CARE_PROVIDER_SITE_OTHER): Payer: Medicare Other

## 2020-08-11 DIAGNOSIS — I5022 Chronic systolic (congestive) heart failure: Secondary | ICD-10-CM

## 2020-08-11 DIAGNOSIS — Z95810 Presence of automatic (implantable) cardiac defibrillator: Secondary | ICD-10-CM

## 2020-08-12 ENCOUNTER — Other Ambulatory Visit (INDEPENDENT_AMBULATORY_CARE_PROVIDER_SITE_OTHER): Payer: Medicare Other

## 2020-08-12 ENCOUNTER — Other Ambulatory Visit: Payer: Self-pay

## 2020-08-12 DIAGNOSIS — Z794 Long term (current) use of insulin: Secondary | ICD-10-CM

## 2020-08-12 DIAGNOSIS — E1165 Type 2 diabetes mellitus with hyperglycemia: Secondary | ICD-10-CM | POA: Diagnosis not present

## 2020-08-12 LAB — COMPREHENSIVE METABOLIC PANEL
ALT: 13 U/L (ref 0–53)
AST: 11 U/L (ref 0–37)
Albumin: 4 g/dL (ref 3.5–5.2)
Alkaline Phosphatase: 54 U/L (ref 39–117)
BUN: 21 mg/dL (ref 6–23)
CO2: 24 mEq/L (ref 19–32)
Calcium: 8.9 mg/dL (ref 8.4–10.5)
Chloride: 106 mEq/L (ref 96–112)
Creatinine, Ser: 1.33 mg/dL (ref 0.40–1.50)
GFR: 52.61 mL/min — ABNORMAL LOW (ref >60.00)
Glucose, Bld: 62 mg/dL — ABNORMAL LOW (ref 70–99)
Potassium: 4.1 mEq/L (ref 3.5–5.1)
Sodium: 139 mEq/L (ref 135–145)
Total Bilirubin: 0.5 mg/dL (ref 0.2–1.2)
Total Protein: 7 g/dL (ref 6.0–8.3)

## 2020-08-12 LAB — HEMOGLOBIN A1C: Hgb A1c MFr Bld: 6.5 % (ref 4.6–6.5)

## 2020-08-13 NOTE — Progress Notes (Signed)
EPIC Encounter for ICM Monitoring  Patient Name: Charles [name] is a 74 y.o. male Date: 08/13/2020 Primary Care Physican: Cyndi Bender, PA-C Primary Cardiologist:Kelly Electrophysiologist:[name] Bi-V Pacing:99% 9/13/2021OfficeWeight:298lbs  AT/AF Burden: 0%  Spoke with patient and reports feeling well at this time.  Denies fluid symptoms.    CorVue thoracic impedancenormal but was suggesting possible fluid accumulation from 9/11-9/18.  Prescribed:  Furosemide40 mg take 20 mg by mouth as needed Xarelto 20 mg take 1 tablet at supper daily  Recommendations:No changes and encouraged to call if experiencing any fluid symptoms.  Follow-up plan: ICM clinic phone appointment on10/25/2021. 91 day device clinic remote transmission9/27/2021.   EP/Cardiology Office Visits:Recall for 01/31/2021 [name].   Copy of ICM check sent to Dr.[name].    3 month ICM trend: 08/11/2020    1 Year ICM trend:       Rosalene Billings, RN 08/13/2020 8:04 AM

## 2020-08-14 ENCOUNTER — Encounter: Payer: Self-pay | Admitting: Endocrinology

## 2020-08-14 ENCOUNTER — Other Ambulatory Visit: Payer: Self-pay

## 2020-08-14 ENCOUNTER — Ambulatory Visit (INDEPENDENT_AMBULATORY_CARE_PROVIDER_SITE_OTHER): Payer: Medicare Other | Admitting: Endocrinology

## 2020-08-14 VITALS — BP 164/102 | HR 74 | Ht 69.0 in | Wt 299.0 lb

## 2020-08-14 DIAGNOSIS — I10 Essential (primary) hypertension: Secondary | ICD-10-CM | POA: Diagnosis not present

## 2020-08-14 DIAGNOSIS — I2510 Atherosclerotic heart disease of native coronary artery without angina pectoris: Secondary | ICD-10-CM | POA: Diagnosis not present

## 2020-08-14 DIAGNOSIS — E1165 Type 2 diabetes mellitus with hyperglycemia: Secondary | ICD-10-CM | POA: Diagnosis not present

## 2020-08-14 DIAGNOSIS — Z794 Long term (current) use of insulin: Secondary | ICD-10-CM

## 2020-08-14 DIAGNOSIS — E782 Mixed hyperlipidemia: Secondary | ICD-10-CM | POA: Diagnosis not present

## 2020-08-14 MED ORDER — GLUCOSE BLOOD VI STRP: ORAL_STRIP | 12 refills | Status: DC
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
## 2020-08-14 NOTE — Patient Instructions (Signed)
12 at Bfst if eating eggs 15 at lunch unless large meal

## 2020-08-15 ENCOUNTER — Telehealth: Payer: Self-pay

## 2020-08-15 NOTE — Telephone Encounter (Signed)
New message    Seen on 9.23.21 has some questions concerning prescription test strips .

## 2020-08-18 ENCOUNTER — Ambulatory Visit (INDEPENDENT_AMBULATORY_CARE_PROVIDER_SITE_OTHER): Payer: Medicare Other | Admitting: Emergency Medicine

## 2020-08-18 DIAGNOSIS — I495 Sick sinus syndrome: Secondary | ICD-10-CM | POA: Diagnosis not present

## 2020-08-18 LAB — CUP PACEART REMOTE DEVICE CHECK
Battery Remaining Longevity: 92 mo
Battery Remaining Percentage: 95.5 %
Battery Voltage: 2.98 V
Brady Statistic AP VP Percent: 94 %
Brady Statistic AP VS Percent: 1 %
Brady Statistic AS VP Percent: 5.3 %
Brady Statistic AS VS Percent: 1 %
Brady Statistic RA Percent Paced: 93 %
Date Time Interrogation Session: 20210927020012
Implantable Lead Implant Date: 20091007
Implantable Lead Implant Date: 20091007
Implantable Lead Implant Date: 20200922
Implantable Lead Location: 753858
Implantable Lead Location: 753859
Implantable Lead Location: 753860
Implantable Pulse Generator Implant Date: 20200922
Lead Channel Impedance Value: 1250 Ohm
Lead Channel Impedance Value: 440 Ohm
Lead Channel Impedance Value: 630 Ohm
Lead Channel Pacing Threshold Amplitude: 0.75 V
Lead Channel Pacing Threshold Amplitude: 1 V
Lead Channel Pacing Threshold Amplitude: 1.25 V
Lead Channel Pacing Threshold Pulse Width: 0.4 ms
Lead Channel Pacing Threshold Pulse Width: 0.4 ms
Lead Channel Pacing Threshold Pulse Width: 0.5 ms
Lead Channel Sensing Intrinsic Amplitude: 12 mV
Lead Channel Sensing Intrinsic Amplitude: 2.4 mV
Lead Channel Setting Pacing Amplitude: 2 V
Lead Channel Setting Pacing Amplitude: 2.25 V
Lead Channel Setting Pacing Amplitude: 2.5 V
Lead Channel Setting Pacing Pulse Width: 0.4 ms
Lead Channel Setting Pacing Pulse Width: 0.5 ms
Lead Channel Setting Sensing Sensitivity: 2 mV
Pulse Gen Model: 3562
Pulse Gen Serial Number: 9157656

## 2020-08-20 NOTE — Progress Notes (Signed)
Remote pacemaker transmission.   

## 2020-08-20 NOTE — Telephone Encounter (Signed)
Left a vm for patient to callback regarding test strips

## 2020-09-15 ENCOUNTER — Ambulatory Visit (INDEPENDENT_AMBULATORY_CARE_PROVIDER_SITE_OTHER): Payer: Medicare Other

## 2020-09-15 DIAGNOSIS — Z95810 Presence of automatic (implantable) cardiac defibrillator: Secondary | ICD-10-CM

## 2020-09-15 DIAGNOSIS — I5022 Chronic systolic (congestive) heart failure: Secondary | ICD-10-CM

## 2020-09-17 ENCOUNTER — Telehealth: Payer: Self-pay

## 2020-09-17 NOTE — Progress Notes (Signed)
EPIC Encounter for ICM Monitoring  Patient Name: Charles [name] is a 74 y.o. male Date: 09/17/2020 Primary Care Physican: Cyndi Bender, PA-C Primary Cardiologist:Kelly Electrophysiologist:[name] Bi-V Pacing:99% 9/13/2021OfficeWeight:298lbs  AT/AF Burden: 0%  Attempted call to patient and unable to reach.  Left detailed message per DPR regarding transmission. Transmission reviewed.     CorVue thoracic impedancenormal.  Prescribed:  Furosemide40 mg take 20 mg by mouth as needed Xarelto 20 mg take 1 tablet at supper daily  Recommendations:Left voice mail with ICM number and encouraged to call if experiencing any fluid symptoms.  Follow-up plan: ICM clinic phone appointment on11/29/2021. 91 day device clinic remote transmission12/27/2021.   EP/Cardiology Office Visits:Recall for 01/31/2021 [name].   Copy of ICM check sent to Dr.[name].    3 month ICM trend: 09/15/2020    1 Year ICM trend:       Rosalene Billings, RN 09/17/2020 2:14 PM

## 2020-09-17 NOTE — Telephone Encounter (Signed)
Remote ICM transmission received.  Attempted call to patient regarding ICM remote transmission and left detailed message per DPR.  Advised to return call for any fluid symptoms or questions. Next ICM remote transmission scheduled 10/20/2020.

## 2020-09-28 ENCOUNTER — Other Ambulatory Visit: Payer: Self-pay | Admitting: Endocrinology

## 2020-10-02 ENCOUNTER — Telehealth: Payer: Self-pay | Admitting: Cardiovascular Disease

## 2020-10-02 NOTE — Telephone Encounter (Signed)
Called and left message for patient to call his dme to ask for the recall number to ask if his machine was one of the affected ones. Patient has a follow up appointment with andy Tillery in office.

## 2020-10-02 NOTE — Telephone Encounter (Signed)
° ° °  Pt would like to know if he needs to f/u with Dr. Claiborne Billings since he is now seeing Dr. [name]. Also, he said he saw it on TV there are CPAP machines being recalled, he wanted to make sure his CPAP is not one of them.

## 2020-10-17 ENCOUNTER — Other Ambulatory Visit: Payer: Self-pay | Admitting: Internal Medicine

## 2020-10-19 ENCOUNTER — Other Ambulatory Visit: Payer: Self-pay | Admitting: Student

## 2020-10-20 ENCOUNTER — Ambulatory Visit (INDEPENDENT_AMBULATORY_CARE_PROVIDER_SITE_OTHER): Payer: Medicare Other

## 2020-10-20 ENCOUNTER — Telehealth: Payer: Self-pay | Admitting: Endocrinology

## 2020-10-20 DIAGNOSIS — Z95810 Presence of automatic (implantable) cardiac defibrillator: Secondary | ICD-10-CM

## 2020-10-20 DIAGNOSIS — Z950 Presence of cardiac pacemaker: Secondary | ICD-10-CM | POA: Diagnosis not present

## 2020-10-20 DIAGNOSIS — I5022 Chronic systolic (congestive) heart failure: Secondary | ICD-10-CM

## 2020-10-20 NOTE — Telephone Encounter (Signed)
Patient getting his labs done at [facility] in [place] prior to his appt with Dr Dwyane Dee on 12/23 - asked if we could send the orders to them so he could get those done.  Does not have fax # but had their phone # - [phone]

## 2020-10-20 NOTE — Telephone Encounter (Signed)
Labs have been faxed per patient request.

## 2020-10-20 NOTE — Telephone Encounter (Signed)
Pt last saw Barrington Ellison, [place] on 08/04/20, last labs 08/12/20 Creat 1.33, age 74, weight 135.6kg, CrCl 94.87, based on CrCl pt is on appropriate dosage of Xarelto 20mg  QD.  Will refill rx.

## 2020-10-21 NOTE — Telephone Encounter (Signed)
LVM informing patient of labs sent.

## 2020-10-22 ENCOUNTER — Telehealth: Payer: Self-pay

## 2020-10-22 NOTE — Telephone Encounter (Signed)
Remote ICM transmission received.  Attempted call to patient regarding ICM remote transmission and left detailed message per DPR.  Advised to return call for any fluid symptoms or questions. Next ICM remote transmission scheduled 11/25/2020.

## 2020-10-22 NOTE — Progress Notes (Signed)
EPIC Encounter for ICM Monitoring  Patient Name: Charles [name] is a 74 y.o. male Date: 10/22/2020 Primary Care Physican: Cyndi Bender, PA-C Primary Cardiologist:Kelly Electrophysiologist:[name] Bi-V Pacing:99% 9/13/2021OfficeWeight:298lbs  AT/AF Burden: 0%  Attempted call to patient and unable to reach.  Left detailed message per DPR regarding transmission. Transmission reviewed.    CorVue thoracic impedancenormal.  Prescribed:  Furosemide40 mg take 20 mg by mouth as needed Xarelto 20 mg take 1 tablet at supper daily  Recommendations:Left voice mail with ICM number and encouraged to call if experiencing any fluid symptoms.  Follow-up plan: ICM clinic phone appointment on1/02/2021. 91 day device clinic remote transmission12/27/2021.   EP/Cardiology Office Visits:Recall for 3/12/2022withAndy Tillery,PA.   Copy of ICM check sent to Dr.[name].  3 month ICM trend: 10/20/2020    1 Year ICM trend:       Rosalene Billings, RN 10/22/2020 2:51 PM

## 2020-10-22 NOTE — Telephone Encounter (Signed)
Pt called back and said they did not receive fax for order. Pt said he has 2 fax numbers for the location. Primary fax [phone] and back up fax [phone].

## 2020-10-22 NOTE — Telephone Encounter (Signed)
Labs were faxed to this number [phone] the first time,  Today they were faxed to both numbers provided by patient

## 2020-10-27 ENCOUNTER — Encounter: Payer: Self-pay | Admitting: Internal Medicine

## 2020-10-27 ENCOUNTER — Ambulatory Visit (INDEPENDENT_AMBULATORY_CARE_PROVIDER_SITE_OTHER): Payer: Medicare Other | Admitting: Internal Medicine

## 2020-10-27 VITALS — BP 112/60 | HR 72 | Ht 69.0 in | Wt 286.0 lb

## 2020-10-27 DIAGNOSIS — I2510 Atherosclerotic heart disease of native coronary artery without angina pectoris: Secondary | ICD-10-CM

## 2020-10-27 DIAGNOSIS — K5900 Constipation, unspecified: Secondary | ICD-10-CM

## 2020-10-27 DIAGNOSIS — R110 Nausea: Secondary | ICD-10-CM | POA: Diagnosis not present

## 2020-10-27 DIAGNOSIS — R197 Diarrhea, unspecified: Secondary | ICD-10-CM

## 2020-10-27 DIAGNOSIS — E538 Deficiency of other specified B group vitamins: Secondary | ICD-10-CM

## 2020-10-27 DIAGNOSIS — R198 Other specified symptoms and signs involving the digestive system and abdomen: Secondary | ICD-10-CM

## 2020-10-27 DIAGNOSIS — R143 Flatulence: Secondary | ICD-10-CM

## 2020-10-27 DIAGNOSIS — Z6841 Body Mass Index (BMI) 40.0 and over, adult: Secondary | ICD-10-CM

## 2020-10-27 DIAGNOSIS — Z79899 Other long term (current) drug therapy: Secondary | ICD-10-CM

## 2020-10-27 MED ORDER — METRONIDAZOLE 250 MG PO TABS: 250.0000 mg | ORAL_TABLET | Freq: Three times a day (TID) | ORAL | 0 refills | Status: DC

## 2020-10-27 NOTE — Patient Instructions (Addendum)
We have sent the following medications to your pharmacy for you to pick up at your convenience: Generic Flagyl , this could cause alittle nausea  Due to recent changes in healthcare laws, you may see the results of your imaging and laboratory studies on MyChart before your provider has had a chance to review them.  We understand that in some cases there may be results that are confusing or concerning to you. Not all laboratory results come back in the same time frame and the provider may be waiting for multiple results in order to interpret others.  Please give [place] 48 hours in order for your provider to thoroughly review all the results before contacting the office for clarification of your results.    Dr Carlean Purl is going to speak with your heart Dr. About your medicine.   Please check into getting the Obesity Code Book by Dr Sharman Cheek. Also Therapist, [email or web address] and go to the low carb section.  Stay on your Metamucil per Dr Carlean Purl.   We are providing you with orders to have blood drawn at your PCP office.   Follow up with Dr Carlean Purl in a month.   I appreciate the opportunity to care for you. Silvano Rusk, MD, [facility]
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
## 2020-10-28 ENCOUNTER — Telehealth: Payer: Self-pay | Admitting: Internal Medicine

## 2020-10-28 NOTE — Telephone Encounter (Signed)
Patient informed and said he went today for the lab work.

## 2020-10-28 NOTE — Telephone Encounter (Signed)
As per documentation in other box, please contact the patient and tell him I have received clearance to stop his amiodarone for now and I will see how he was doing in January when he returns.  I am also waiting to see the results of the  labs he had done in [place] and will contact him again after I see those.

## 2020-10-28 NOTE — Telephone Encounter (Signed)
-----   Message from Shirley Friar, PA-C sent at 10/28/2020  2:31 PM EST ----- Regarding: RE: Question hold amiodarone at some point I talked to Dr [name] and we're fine to stop it since it's for AF!    I'll make an appointment to check on him in 6-8 weeks to see how his AF is doing.  We're doing great! Hope you are as well! ----- Message ----- From: Gatha Mayer, MD Sent: 10/27/2020   1:14 PM EST To: Shirley Friar, PA-C Subject: Question hold amiodarone at some point         [facility],  This poor man suffers from a lot of symptoms and I suppose amiodarone could be contributing to some of them.  I am going to treat him with some metronidazole for question of small intestinal bacterial overgrowth.  I was wondering about holding amiodarone to see how he feels.  I do not feel strongly about this but was wondering about the possibility of that.  Perhaps you could check with your EP team after looking at this and let me know if that is possible.  I certainly would not do that without double checking again.  Hope all is well with you and your family and in your professional life.  Glendell Docker

## 2020-11-13 ENCOUNTER — Ambulatory Visit (INDEPENDENT_AMBULATORY_CARE_PROVIDER_SITE_OTHER): Payer: Medicare Other | Admitting: Endocrinology

## 2020-11-13 ENCOUNTER — Encounter: Payer: Self-pay | Admitting: Endocrinology

## 2020-11-13 VITALS — BP 120/68 | HR 76 | Ht 69.0 in | Wt 291.6 lb

## 2020-11-13 DIAGNOSIS — I10 Essential (primary) hypertension: Secondary | ICD-10-CM

## 2020-11-13 DIAGNOSIS — I2510 Atherosclerotic heart disease of native coronary artery without angina pectoris: Secondary | ICD-10-CM | POA: Diagnosis not present

## 2020-11-13 DIAGNOSIS — K529 Noninfective gastroenteritis and colitis, unspecified: Secondary | ICD-10-CM | POA: Diagnosis not present

## 2020-11-13 DIAGNOSIS — Z794 Long term (current) use of insulin: Secondary | ICD-10-CM

## 2020-11-13 DIAGNOSIS — N289 Disorder of kidney and ureter, unspecified: Secondary | ICD-10-CM

## 2020-11-13 DIAGNOSIS — E1165 Type 2 diabetes mellitus with hyperglycemia: Secondary | ICD-10-CM | POA: Diagnosis not present
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
## 2020-11-13 NOTE — Patient Instructions (Signed)
Tresiba 42 and keep am sugar 120-130  Stop Metformin for 2 weeks and see if stools better

## 2020-11-17 ENCOUNTER — Ambulatory Visit (INDEPENDENT_AMBULATORY_CARE_PROVIDER_SITE_OTHER): Payer: Medicare Other

## 2020-11-17 DIAGNOSIS — I495 Sick sinus syndrome: Secondary | ICD-10-CM | POA: Diagnosis not present

## 2020-11-17 LAB — CUP PACEART REMOTE DEVICE CHECK
Battery Remaining Longevity: 94 mo
Battery Remaining Percentage: 95.5 %
Battery Voltage: 2.98 V
Brady Statistic AP VP Percent: 87 %
Brady Statistic AP VS Percent: 1 %
Brady Statistic AS VP Percent: 12 %
Brady Statistic AS VS Percent: 1 %
Brady Statistic RA Percent Paced: 85 %
Date Time Interrogation Session: 20211227020009
Implantable Lead Implant Date: 20091007
Implantable Lead Implant Date: 20091007
Implantable Lead Implant Date: 20200922
Implantable Lead Location: 753858
Implantable Lead Location: 753859
Implantable Lead Location: 753860
Implantable Pulse Generator Implant Date: 20200922
Lead Channel Impedance Value: 1275 Ohm
Lead Channel Impedance Value: 450 Ohm
Lead Channel Impedance Value: 640 Ohm
Lead Channel Pacing Threshold Amplitude: 0.75 V
Lead Channel Pacing Threshold Amplitude: 1 V
Lead Channel Pacing Threshold Amplitude: 1.25 V
Lead Channel Pacing Threshold Pulse Width: 0.4 ms
Lead Channel Pacing Threshold Pulse Width: 0.4 ms
Lead Channel Pacing Threshold Pulse Width: 0.5 ms
Lead Channel Sensing Intrinsic Amplitude: 1.4 mV
Lead Channel Sensing Intrinsic Amplitude: 12 mV
Lead Channel Setting Pacing Amplitude: 2 V
Lead Channel Setting Pacing Amplitude: 2.25 V
Lead Channel Setting Pacing Amplitude: 2.5 V
Lead Channel Setting Pacing Pulse Width: 0.4 ms
Lead Channel Setting Pacing Pulse Width: 0.5 ms
Lead Channel Setting Sensing Sensitivity: 2 mV
Pulse Gen Model: 3562
Pulse Gen Serial Number: 9157656

## 2020-11-18 ENCOUNTER — Ambulatory Visit (INDEPENDENT_AMBULATORY_CARE_PROVIDER_SITE_OTHER): Payer: Medicare Other | Admitting: Emergency Medicine

## 2020-11-18 ENCOUNTER — Other Ambulatory Visit: Payer: Self-pay

## 2020-11-18 ENCOUNTER — Telehealth: Payer: Self-pay | Admitting: Internal Medicine

## 2020-11-18 DIAGNOSIS — I255 Ischemic cardiomyopathy: Secondary | ICD-10-CM | POA: Diagnosis not present

## 2020-11-18 LAB — CUP PACEART INCLINIC DEVICE CHECK
Brady Statistic RA Percent Paced: 85 %
Brady Statistic RV Percent Paced: 99 %
Date Time Interrogation Session: 20211228154217
Implantable Lead Implant Date: 20091007
Implantable Lead Implant Date: 20091007
Implantable Lead Implant Date: 20200922
Implantable Lead Location: 753858
Implantable Lead Location: 753859
Implantable Lead Location: 753860
Implantable Pulse Generator Implant Date: 20200922
Lead Channel Pacing Threshold Amplitude: 0.75 V
Lead Channel Pacing Threshold Amplitude: 0.75 V
Lead Channel Pacing Threshold Amplitude: 1.25 V
Lead Channel Pacing Threshold Pulse Width: 0.4 ms
Lead Channel Pacing Threshold Pulse Width: 0.4 ms
Lead Channel Pacing Threshold Pulse Width: 0.5 ms
Lead Channel Sensing Intrinsic Amplitude: 1.6 mV
Lead Channel Sensing Intrinsic Amplitude: 12 mV
Pulse Gen Model: 3562
Pulse Gen Serial Number: 9157656

## 2020-11-18 NOTE — Patient Instructions (Signed)
Please call if any further symptoms present. Please follow up in regards to any other concerns you may have.   Device Clinic [phone]

## 2020-11-18 NOTE — Telephone Encounter (Signed)
Patient states these symptoms developed about 1 hour ago and he sent in a transmission so he can be evaluated.   STAT if patient feels like he/she is going to faint   1) Are you dizzy now? Patient states he is experiencing mild dizziness  2) Do you feel faint or have you passed out? No  3) Do you have any other symptoms? SOB, irregular HR  4) Have you checked your HR and BP (record if available)?  BP: 143/76  BP: 187/82 HR: 88 BP: 139/78 HR: 75 BP: 184/87 HR: 80 BP: 140/89 HR: 87   Pt c/o Shortness Of Breath: STAT if SOB developed within the last 24 hours or pt is noticeably SOB on the phone  1. Are you currently SOB (can you hear that pt is SOB on the phone)? No  2. How long have you been experiencing SOB? Patient states SOB developed about 1 hour ago  3. Are you SOB when sitting or when up moving around? Both   4. Are you currently experiencing any other symptoms? Dizziness, irregular HR   Patient c/o Palpitations:  High priority if patient c/o lightheadedness, shortness of breath, or chest pain  1) How long have you had palpitations/irregular HR/ Afib? Are you having the symptoms now? Patient states his HR currently feels abnormal  2) Are you currently experiencing lightheadedness, SOB or CP? SOB, lightheadedness  3) Do you have a history of afib (atrial fibrillation) or irregular heart rhythm? Yes    4) Have you checked your BP or HR? (document readings if available):  BP: 143/76  BP: 187/82 HR: 88 BP: 139/78 HR: 75 BP: 184/87 HR: 80 BP: 140/89 HR: 87  5) Are you experiencing any other symptoms? No

## 2020-11-18 NOTE — Telephone Encounter (Addendum)
Returning phone call.   Patient reports around 10:00-10:30 patient reports he experienced lightheadedness and experienced near syncopal event. Patient reports at this time he experiences no symptoms.   [facility] remote reviewed. 1 PMT event did occur around time. Advised patient we will need to bring him in for testing. Device Clinic apt. Made today at 3:20. ED precautions given.

## 2020-11-18 NOTE — Telephone Encounter (Signed)
Spoke with the patient who states that he was hanging some curtains and became very dizzy. He said that he also felt that his heart rate was irregular, he felt jittery, and had some SOB. He also checked his blood sugar which was 253. He has taken his medications for DM.  He also took Engineer, maintenance and promethaline. He states that his symptoms have improved. Most recent BP was 143/67. HR 76.  He has sent in a remote transmission from his device. I will route message to device team to check on.

## 2020-11-18 NOTE — Telephone Encounter (Signed)
Device clinic note from today reviewed. He should contact our office if symptoms persist.

## 2020-11-18 NOTE — Progress Notes (Signed)
CRT-P device check in clinic. Normal device function. Thresholds, sensing, impedance consistent with previous measurements. Histograms appropriate for patient and level of activity. AT/AF burden <1%. 18 PMT events since 08/04/20. No ventricular high rate episodes. Patient bi-ventricularly pacing 99% of the time. Device programmed with appropriate safety margins. Device heart failure diagnostics are within normal limits and stable over time. Estimated longevity 7.5-8.2 years. Patient enrolled in remote follow-up 11/25/2020.  Patient brought into device clinic for PMT event that occurred today, patient was symptomatic around time frame. While running capture, another event of PMT occurred. Retrograde was 336 ms. PVARP extended to 375 ms and Rate Response AV Delay programmed to high. [employer] rep. Donnald Garre was present.  Patient denies any events after this am. States he has a hx of dizziness and nausea and takes medication for both. Patient advised to follow up in regards to those symptoms. ED precautions given. Advised I will forward to Dr. Johney Frame to make him aware. Patient verbalized understanding.

## 2020-11-19 NOTE — Telephone Encounter (Signed)
Spoke with Charles [name], advised Dr. Johney Frame has reviewed report.  Requests Charles [name] call us if symptoms persist or worsen.  Charles [name] v/u of instructions.

## 2020-11-24 ENCOUNTER — Telehealth: Payer: Self-pay | Admitting: Endocrinology

## 2020-11-24 NOTE — Telephone Encounter (Signed)
Noted, patient states that he had nausea and brain fog when his sugar dropped low, he just took it and it's back up to 219. I told him you said he could take his insulin as before but to monitor his sugar and let me know if it goes low again.

## 2020-11-24 NOTE — Telephone Encounter (Signed)
Likely the blood sugar of 30 was erroneous.  Did he have any symptoms?  He can take his insulin as before

## 2020-11-25 ENCOUNTER — Ambulatory Visit (INDEPENDENT_AMBULATORY_CARE_PROVIDER_SITE_OTHER): Payer: Medicare Other

## 2020-11-25 DIAGNOSIS — Z95810 Presence of automatic (implantable) cardiac defibrillator: Secondary | ICD-10-CM

## 2020-11-25 DIAGNOSIS — I5022 Chronic systolic (congestive) heart failure: Secondary | ICD-10-CM | POA: Diagnosis not present

## 2020-11-28 NOTE — Progress Notes (Signed)
EPIC Encounter for ICM Monitoring  Patient Name: Charles [name] is a 75 y.o. male Date: 11/28/2020 Primary Care Physican: Cyndi Bender, PA-C Primary Cardiologist:Kelly Electrophysiologist:[name] Bi-V Pacing:98% 9/13/2021OfficeWeight:298lbs  AT/AF Burden: 0%  Transmission reviewed.  CorVue thoracic impedancenormal.  Prescribed:  Furosemide40 mg take 20 mg by mouth as needed Xarelto 20 mg take 1 tablet at supper daily  Recommendations:No changes  Follow-up plan: ICM clinic phone appointment on2/06/2021. 91 day device clinic remote transmission3/28/2022.   EP/Cardiology Office Visits:12/10/2020 [name].   Copy of ICM check sent to Dr.[name].  3 month ICM trend: 11/26/2020.    1 Year ICM trend:       Rosalene Billings, RN 11/28/2020 1:48 PM

## 2020-12-01 ENCOUNTER — Other Ambulatory Visit: Payer: Self-pay | Admitting: Endocrinology

## 2020-12-01 NOTE — Progress Notes (Signed)
Remote pacemaker transmission.   

## 2020-12-03 ENCOUNTER — Telehealth: Payer: Self-pay | Admitting: Endocrinology

## 2020-12-03 NOTE — Telephone Encounter (Signed)
Per Patient's insurance and PHARM-PA's are required for Rx's Continuous Blood Gluc Receiver (FREESTYLE LIBRE 14 DAY READER) DEVI  Continuous Blood Gluc Sensor (FREESTYLE LIBRE 14 DAY SENSOR) MISC  Also, Medicare Part B requires that Dr. Dwyane Dee put on the PA's for the above that Patient already has had the Freestyle Libre 14 Day Meter for 2 years-previously covered under Medicare Part D  Patient's PHARM listed above told Patient that they have sent forms to Dr. Dwyane Dee with no response re: the above-PHARM is [id number] [address], [place] Phone:  [phone]  Fax:  [phone]

## 2020-12-03 NOTE — Telephone Encounter (Signed)
Okay to do PA 

## 2020-12-04 ENCOUNTER — Ambulatory Visit (INDEPENDENT_AMBULATORY_CARE_PROVIDER_SITE_OTHER): Payer: Medicare Other | Admitting: Internal Medicine

## 2020-12-04 ENCOUNTER — Encounter: Payer: Self-pay | Admitting: Internal Medicine

## 2020-12-04 VITALS — BP 128/78 | HR 75 | Ht 69.0 in | Wt 287.0 lb

## 2020-12-04 DIAGNOSIS — K5900 Constipation, unspecified: Secondary | ICD-10-CM

## 2020-12-04 DIAGNOSIS — R110 Nausea: Secondary | ICD-10-CM

## 2020-12-04 DIAGNOSIS — E538 Deficiency of other specified B group vitamins: Secondary | ICD-10-CM | POA: Diagnosis not present

## 2020-12-04 DIAGNOSIS — R143 Flatulence: Secondary | ICD-10-CM

## 2020-12-04 DIAGNOSIS — T887XXA Unspecified adverse effect of drug or medicament, initial encounter: Secondary | ICD-10-CM
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
## 2020-12-04 NOTE — Patient Instructions (Signed)
We are providing you with a handout to read on Kiwi fruit and how they can help chronic constipation.  Please take a dose of over the counter Miralax daily.  We are going to request a copy of your December labs from Cyndi Bender, [place] for Dr Carlean Purl to review.   I appreciate the opportunity to care for you. Silvano Rusk, MD, [facility]

## 2020-12-05 NOTE — Telephone Encounter (Signed)
Charles [name] is usually not covered by part D at the pharmacy. He should contact the mail order supply companies such as [employer] or [employer] to get it

## 2020-12-05 NOTE — Telephone Encounter (Signed)
[facility] - [phone]  [facility] - [phone]   LVM for pt to call back for names and number of DME companies to help with the cost of continuous glucose monitoring.

## 2020-12-05 NOTE — Telephone Encounter (Signed)
Call made to pharmacy to verify pt insurance information.   Pharmacy stated that the [facility] Reader/ Sensor is ran through part B.   BIN - [id number] PCN - [id number]  ID - [id number]

## 2020-12-10 ENCOUNTER — Encounter: Payer: Medicare Other | Admitting: Student

## 2020-12-20 ENCOUNTER — Other Ambulatory Visit: Payer: Self-pay | Admitting: Student

## 2020-12-20 ENCOUNTER — Other Ambulatory Visit: Payer: Self-pay | Admitting: Endocrinology

## 2020-12-22 ENCOUNTER — Other Ambulatory Visit: Payer: Self-pay | Admitting: Student

## 2020-12-22 ENCOUNTER — Other Ambulatory Visit: Payer: Self-pay | Admitting: Endocrinology

## 2020-12-26 ENCOUNTER — Other Ambulatory Visit: Payer: Self-pay | Admitting: Endocrinology

## 2020-12-30 ENCOUNTER — Ambulatory Visit (INDEPENDENT_AMBULATORY_CARE_PROVIDER_SITE_OTHER): Payer: Medicare Other

## 2020-12-30 DIAGNOSIS — Z950 Presence of cardiac pacemaker: Secondary | ICD-10-CM | POA: Diagnosis not present

## 2020-12-30 DIAGNOSIS — I5022 Chronic systolic (congestive) heart failure: Secondary | ICD-10-CM

## 2020-12-30 DIAGNOSIS — Z95810 Presence of automatic (implantable) cardiac defibrillator: Secondary | ICD-10-CM

## 2021-01-05 NOTE — Progress Notes (Signed)
EPIC Encounter for ICM Monitoring  Patient Name: Charles [name] is a 75 y.o. male Date: 01/05/2021 Primary Care Physican: Cyndi Bender, PA-C Primary Cardiologist:Kelly Electrophysiologist:[name] Bi-V Pacing:97% 9/13/2021OfficeWeight:298lbs  AT/AF Burden: 0%  Transmission reviewed.  CorVue thoracic impedancenormal.  Prescribed:  Furosemide40 mg take 20 mg by mouth as needed Xarelto 20 mg take 1 tablet at supper daily  Recommendations:No changes  Follow-up plan: ICM clinic phone appointment on3/14/2022. 91 day device clinic remote transmission3/28/2022.   EP/Cardiology Office Visits:01/08/2021 [name].   Copy of ICM check sent to Dr.[name].  3 month ICM trend: 12/30/2020.    1 Year ICM trend:       Rosalene Billings, RN 01/05/2021 4:40 PM

## 2021-01-08 ENCOUNTER — Other Ambulatory Visit: Payer: Self-pay | Admitting: Endocrinology

## 2021-01-08 ENCOUNTER — Ambulatory Visit (INDEPENDENT_AMBULATORY_CARE_PROVIDER_SITE_OTHER): Payer: Medicare Other | Admitting: Student

## 2021-01-08 ENCOUNTER — Other Ambulatory Visit: Payer: Self-pay

## 2021-01-08 ENCOUNTER — Encounter: Payer: Self-pay | Admitting: Student

## 2021-01-08 VITALS — BP 122/60 | HR 97 | Ht 69.0 in | Wt 293.0 lb

## 2021-01-08 DIAGNOSIS — I480 Paroxysmal atrial fibrillation: Secondary | ICD-10-CM | POA: Diagnosis not present

## 2021-01-08 DIAGNOSIS — I5022 Chronic systolic (congestive) heart failure: Secondary | ICD-10-CM | POA: Diagnosis not present

## 2021-01-08 DIAGNOSIS — I495 Sick sinus syndrome: Secondary | ICD-10-CM | POA: Diagnosis not present

## 2021-01-08 DIAGNOSIS — I255 Ischemic cardiomyopathy: Secondary | ICD-10-CM

## 2021-01-08 LAB — CUP PACEART INCLINIC DEVICE CHECK
Battery Remaining Longevity: 91 mo
Battery Voltage: 2.99 V
Brady Statistic RA Percent Paced: 80 %
Brady Statistic RV Percent Paced: 97 %
Date Time Interrogation Session: 20220217131646
Implantable Lead Implant Date: 20091007
Implantable Lead Implant Date: 20091007
Implantable Lead Implant Date: 20200922
Implantable Lead Location: 753858
Implantable Lead Location: 753859
Implantable Lead Location: 753860
Implantable Pulse Generator Implant Date: 20200922
Lead Channel Impedance Value: 1287.5 Ohm
Lead Channel Impedance Value: 450 Ohm
Lead Channel Impedance Value: 662.5 Ohm
Lead Channel Pacing Threshold Amplitude: 0.75 V
Lead Channel Pacing Threshold Amplitude: 0.75 V
Lead Channel Pacing Threshold Amplitude: 0.75 V
Lead Channel Pacing Threshold Amplitude: 0.75 V
Lead Channel Pacing Threshold Amplitude: 1.25 V
Lead Channel Pacing Threshold Amplitude: 1.25 V
Lead Channel Pacing Threshold Pulse Width: 0.4 ms
Lead Channel Pacing Threshold Pulse Width: 0.4 ms
Lead Channel Pacing Threshold Pulse Width: 0.4 ms
Lead Channel Pacing Threshold Pulse Width: 0.4 ms
Lead Channel Pacing Threshold Pulse Width: 0.5 ms
Lead Channel Pacing Threshold Pulse Width: 0.5 ms
Lead Channel Sensing Intrinsic Amplitude: 12 mV
Lead Channel Sensing Intrinsic Amplitude: 2 mV
Lead Channel Setting Pacing Amplitude: 2 V
Lead Channel Setting Pacing Amplitude: 2.25 V
Lead Channel Setting Pacing Amplitude: 2.5 V
Lead Channel Setting Pacing Pulse Width: 0.4 ms
Lead Channel Setting Pacing Pulse Width: 0.5 ms
Lead Channel Setting Sensing Sensitivity: 2 mV
Pulse Gen Model: 3562
Pulse Gen Serial Number: 9157656

## 2021-01-08 NOTE — Patient Instructions (Signed)
Medication Instructions:  Your physician recommends that you continue on your current medications as directed. Please refer to the Current Medication list given to you today.  *If you need a refill on your cardiac medications before your next appointment, please call your pharmacy*   Lab Work: TODAY: BMET, MAG  If you have labs (blood work) drawn today and your tests are completely normal, you will receive your results only by: [name] MyChart Message (if you have MyChart) OR . A paper copy in the mail If you have any lab test that is abnormal or we need to change your treatment, we will call you to review the results.   Follow-Up: At [facility], you and your health needs are our priority.  As part of our continuing mission to provide you with exceptional heart care, we have created designated Provider Care Teams.  These Care Teams include your primary Cardiologist (physician) and Advanced Practice Providers (APPs -  Physician Assistants and Nurse Practitioners) who all work together to provide you with the care you need, when you need it.  We recommend signing up for the patient portal called "MyChart".  Sign up information is provided on this After Visit Summary.  MyChart is used to connect with patients for Virtual Visits (Telemedicine).  Patients are able to view lab/test results, encounter notes, upcoming appointments, etc.  Non-urgent messages can be sent to your provider as well.   To learn more about what you can do with MyChart, go to [email or web address].    Your next appointment:   6 month(s)  The format for your next appointment:   In Person  Provider:   You may see Thompson Grayer, MD or one of the following Advanced Practice Providers on your designated Care Team:    Chanetta Marshall, NP  Tommye Standard, PA-C  Legrand Como "[place]" [place], [place]
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
## 2021-01-11 LAB — CBC
Hematocrit: 44.4 % (ref 37.5–51.0)
Hemoglobin: 14.7 g/dL (ref 13.0–17.7)
MCH: 30.3 pg (ref 26.6–33.0)
MCHC: 33.1 g/dL (ref 31.5–35.7)
MCV: 92 fL (ref 79–97)
Platelets: 205 10*3/uL (ref 150–450)
RBC: 4.85 x10E6/uL (ref 4.14–5.80)
RDW: 13.5 % (ref 11.6–15.4)
WBC: 5.8 10*3/uL (ref 3.4–10.8)

## 2021-01-11 LAB — BASIC METABOLIC PANEL
BUN/Creatinine Ratio: 18 (ref 10–24)
BUN: 21 mg/dL (ref 8–27)
CO2: 16 mmol/L — ABNORMAL LOW (ref 20–29)
Calcium: 8.9 mg/dL (ref 8.6–10.2)
Chloride: 104 mmol/L (ref 96–106)
Creatinine, Ser: 1.2 mg/dL (ref 0.76–1.27)
GFR calc Af Amer: 68 mL/min/{1.73_m2} (ref >59)
GFR calc non Af Amer: 59 mL/min/{1.73_m2} — ABNORMAL LOW (ref >59)
Glucose: 227 mg/dL — ABNORMAL HIGH (ref 65–99)
Potassium: 4.7 mmol/L (ref 3.5–5.2)
Sodium: 139 mmol/L (ref 134–144)

## 2021-01-12 ENCOUNTER — Encounter: Payer: Self-pay | Admitting: Endocrinology

## 2021-01-12 LAB — HM DIABETES EYE EXAM

## 2021-01-14 ENCOUNTER — Other Ambulatory Visit: Payer: Self-pay | Admitting: Endocrinology

## 2021-02-02 ENCOUNTER — Ambulatory Visit (INDEPENDENT_AMBULATORY_CARE_PROVIDER_SITE_OTHER): Payer: Medicare Other

## 2021-02-02 DIAGNOSIS — Z950 Presence of cardiac pacemaker: Secondary | ICD-10-CM | POA: Diagnosis not present

## 2021-02-02 DIAGNOSIS — I5022 Chronic systolic (congestive) heart failure: Secondary | ICD-10-CM | POA: Diagnosis not present

## 2021-02-03 NOTE — Progress Notes (Signed)
EPIC Encounter for ICM Monitoring  Patient Name: Charles [name] is a 75 y.o. male Date: 02/03/2021 Primary Care Physican: Cyndi Bender, PA-C Primary Cardiologist:Kelly Electrophysiologist:[name] Bi-V Pacing:96% 2/15/2022Weight:293lbs  AT/AF Burden: 0%  Spoke with patient and reports feeling well at this time.  Denies fluid symptoms.    CorVue thoracic impedancenormal.  Prescribed:  Furosemide40 mg take 20 mg by mouth as needed Xarelto 20 mg take 1 tablet at supper daily  Recommendations:No changes and encouraged to call if experiencing any fluid symptoms.  Follow-up plan: ICM clinic phone appointment on4/18/2022. 91 day device clinic remote transmission3/28/2022.   EP/Cardiology Office Visits: Recall 3/12/2022withAndy Tillery,PA. Recall 07/07/2021 with Dr [name].  Copy of ICM check sent to Dr.[name].  3 month ICM trend: 02/02/2021.    1 Year ICM trend:       Rosalene Billings, RN 02/03/2021 9:44 AM

## 2021-02-12 ENCOUNTER — Other Ambulatory Visit: Payer: Self-pay | Admitting: Internal Medicine

## 2021-02-12 NOTE — Telephone Encounter (Signed)
Prescription refill request for Xarelto received.   Indication: afib Last office visit: 01/08/2021, T[place] Weight: 132.9 kg Age: 75 yo  Scr: 1.20, 01/08/2021 CrCl: 101.52 ml/min   Pt is on the correct dose of Xarelto per dosing criteria, prescription refill sent for Xarelto 20 mg daily. Prescription refill sent.

## 2021-02-16 ENCOUNTER — Ambulatory Visit (INDEPENDENT_AMBULATORY_CARE_PROVIDER_SITE_OTHER): Payer: Medicare Other

## 2021-02-16 DIAGNOSIS — I495 Sick sinus syndrome: Secondary | ICD-10-CM

## 2021-02-16 LAB — CUP PACEART REMOTE DEVICE CHECK
Battery Remaining Longevity: 95 mo
Battery Remaining Percentage: 95.5 %
Battery Voltage: 2.98 V
Brady Statistic AP VP Percent: 82 %
Brady Statistic AP VS Percent: 1 %
Brady Statistic AS VP Percent: 14 %
Brady Statistic AS VS Percent: 1.3 %
Brady Statistic RA Percent Paced: 78 %
Date Time Interrogation Session: 20220328024600
Implantable Lead Implant Date: 20091007
Implantable Lead Implant Date: 20091007
Implantable Lead Implant Date: 20200922
Implantable Lead Location: 753858
Implantable Lead Location: 753859
Implantable Lead Location: 753860
Implantable Pulse Generator Implant Date: 20200922
Lead Channel Impedance Value: 1275 Ohm
Lead Channel Impedance Value: 460 Ohm
Lead Channel Impedance Value: 640 Ohm
Lead Channel Pacing Threshold Amplitude: 0.75 V
Lead Channel Pacing Threshold Amplitude: 0.75 V
Lead Channel Pacing Threshold Amplitude: 1.25 V
Lead Channel Pacing Threshold Pulse Width: 0.4 ms
Lead Channel Pacing Threshold Pulse Width: 0.4 ms
Lead Channel Pacing Threshold Pulse Width: 0.5 ms
Lead Channel Sensing Intrinsic Amplitude: 1.6 mV
Lead Channel Sensing Intrinsic Amplitude: 12 mV
Lead Channel Setting Pacing Amplitude: 2 V
Lead Channel Setting Pacing Amplitude: 2.25 V
Lead Channel Setting Pacing Amplitude: 2.5 V
Lead Channel Setting Pacing Pulse Width: 0.4 ms
Lead Channel Setting Pacing Pulse Width: 0.5 ms
Lead Channel Setting Sensing Sensitivity: 2 mV
Pulse Gen Model: 3562
Pulse Gen Serial Number: 9157656

## 2021-02-27 NOTE — Progress Notes (Signed)
Remote pacemaker transmission.   

## 2021-03-09 ENCOUNTER — Ambulatory Visit (INDEPENDENT_AMBULATORY_CARE_PROVIDER_SITE_OTHER): Payer: Medicare Other

## 2021-03-09 ENCOUNTER — Telehealth: Payer: Self-pay | Admitting: Endocrinology

## 2021-03-09 ENCOUNTER — Other Ambulatory Visit: Payer: Self-pay | Admitting: Endocrinology

## 2021-03-09 DIAGNOSIS — I5022 Chronic systolic (congestive) heart failure: Secondary | ICD-10-CM | POA: Diagnosis not present

## 2021-03-09 DIAGNOSIS — Z95810 Presence of automatic (implantable) cardiac defibrillator: Secondary | ICD-10-CM | POA: Diagnosis not present

## 2021-03-09 DIAGNOSIS — Z794 Long term (current) use of insulin: Secondary | ICD-10-CM

## 2021-03-09 DIAGNOSIS — E1165 Type 2 diabetes mellitus with hyperglycemia: Secondary | ICD-10-CM

## 2021-03-09 NOTE — Telephone Encounter (Signed)
Patient called and requested that their labs be faxed to their PCP - [facility] in [place] so that they can be drawn and results returned to [place] before upcoming visit with Dr Caleen Jobs # [phone] FAX # [phone]

## 2021-03-10 NOTE — Telephone Encounter (Signed)
Labs faxed on 03/09/21

## 2021-03-13 ENCOUNTER — Other Ambulatory Visit: Payer: Self-pay | Admitting: Student

## 2021-03-13 LAB — LIPID PANEL
Cholesterol: 199 (ref 0–200)
HDL: 47 (ref 35–70)
LDL Cholesterol: 121
LDl/HDL Ratio: 2.6
Triglycerides: 177 — AB (ref 40–160)

## 2021-03-13 LAB — HEMOGLOBIN A1C: Hemoglobin A1C: 7.6

## 2021-03-13 LAB — BASIC METABOLIC PANEL
BUN: 21 (ref 4–21)
Creatinine: 1.4 — AB (ref <=1.3)

## 2021-03-13 NOTE — Progress Notes (Signed)
EPIC Encounter for ICM Monitoring  Patient Name: Charles [name] is a 75 y.o. male Date: 03/13/2021 Primary Care Physican: Cyndi Bender, PA-C Primary Cardiologist:Kelly Electrophysiologist:[name] Bi-V Pacing:96% 4/22/2022Weight:293lbs  AT/AF Burden: 0%  Spoke with patient and reports feeling well at this time.  Denies fluid symptoms.    CorVue thoracic impedancenormal.  Prescribed:  Furosemide40 mg take 20 mg by mouth as needed Xarelto 20 mg take 1 tablet at supper daily  Recommendations: No changes and encouraged to call if experiencing any fluid symptoms.  Follow-up plan: ICM clinic phone appointment on5/31/2022. 91 day device clinic remote transmission6/27/2022.   EP/Cardiology Office Visits: Advised to call Dr Evette Georges office for overdue appointment. Recall 3/12/2022withAndy Tillery,PA. Recall 07/07/2021 with Dr [name].  Copy of ICM check sent to Dr.[name].  3 month ICM trend: 03/09/2021.    1 Year ICM trend:       Rosalene Billings, RN 03/13/2021 10:16 AM

## 2021-03-17 ENCOUNTER — Encounter: Payer: Self-pay | Admitting: Endocrinology

## 2021-03-19 ENCOUNTER — Ambulatory Visit: Payer: Medicare Other | Admitting: Endocrinology

## 2021-03-23 ENCOUNTER — Encounter: Payer: Self-pay | Admitting: Endocrinology

## 2021-03-26 ENCOUNTER — Other Ambulatory Visit: Payer: Self-pay

## 2021-03-26 ENCOUNTER — Telehealth: Payer: Self-pay | Admitting: Endocrinology

## 2021-03-26 ENCOUNTER — Ambulatory Visit (INDEPENDENT_AMBULATORY_CARE_PROVIDER_SITE_OTHER): Payer: Medicare Other | Admitting: Endocrinology

## 2021-03-26 VITALS — BP 118/74 | HR 80 | Ht 69.0 in | Wt 290.6 lb

## 2021-03-26 DIAGNOSIS — N289 Disorder of kidney and ureter, unspecified: Secondary | ICD-10-CM | POA: Diagnosis not present

## 2021-03-26 DIAGNOSIS — E1165 Type 2 diabetes mellitus with hyperglycemia: Secondary | ICD-10-CM

## 2021-03-26 DIAGNOSIS — I255 Ischemic cardiomyopathy: Secondary | ICD-10-CM | POA: Diagnosis not present

## 2021-03-26 DIAGNOSIS — R55 Syncope and collapse: Secondary | ICD-10-CM | POA: Diagnosis not present

## 2021-03-26 DIAGNOSIS — Z794 Long term (current) use of insulin: Secondary | ICD-10-CM | POA: Diagnosis not present

## 2021-03-26 NOTE — Patient Instructions (Signed)
[name] 1/2 twice daily

## 2021-03-26 NOTE — Telephone Encounter (Signed)
Patient is scheduled for follow up appointment with Dr. Dwyane Dee on 05/28/21. Patient requests prior lab orders be sent to the following PCP facility at least 5 business days before appointment with Dr. Dwyane Dee:   [facility] [place] # [phone] FAX # [phone]
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
## 2021-03-27 ENCOUNTER — Other Ambulatory Visit: Payer: Self-pay | Admitting: Endocrinology

## 2021-03-27 LAB — MICROALBUMIN / CREATININE URINE RATIO
Creatinine,U: 37.7 mg/dL
Microalb Creat Ratio: 9.9 mg/g (ref 0.0–30.0)
Microalb, Ur: 3.7 mg/dL — ABNORMAL HIGH (ref 0.0–1.9)

## 2021-03-27 NOTE — Addendum Note (Signed)
Addended by: Kaylyn Lim I on: 03/27/2021 11:40 AM   Modules accepted: Orders

## 2021-03-30 ENCOUNTER — Telehealth: Payer: Self-pay | Admitting: Endocrinology

## 2021-03-30 NOTE — Telephone Encounter (Signed)
Please let him know that I reviewed his freestyle libre.  Since he is having morning readings over 130 he should go up to 42 units of Tresiba instead of 40.  He needs to check more readings at bedtime to see how evening insulin works.  Also if his blood sugars are high from forgetting the insulin before eating he should not take it more than 20 minutes later

## 2021-03-30 NOTE — Telephone Encounter (Signed)
No answer when I called him just now.

## 2021-03-31 ENCOUNTER — Telehealth: Payer: Self-pay | Admitting: Cardiovascular Disease

## 2021-03-31 NOTE — Telephone Encounter (Signed)
FYI:  On your desk to sign and decide what labs you want.

## 2021-03-31 NOTE — Telephone Encounter (Signed)
Called patient. He states that he is having some dizziness,off- balance, shortness of breath with exertion and not feeling well. He states it is when he is doing activities and he is moving around. He wants to make sure he is coming to the right doctor. He has not seen Dr.Kelly in a while, but I advised he could be orthostatic and how to check this at home and bring that log with him to the visit. Patient was also given ED precautions if symptoms worsen before appointment.  Patient verbalized understanding.  I advised I would route to MD and advise of any recommendations before upcoming appointment on 04/06/21. Thanks!

## 2021-03-31 NOTE — Telephone Encounter (Signed)
Patient stated that he is having physical problems and he wanted to make sure he coming to the right dr on 5/16. Patient states he does feel some pain and would like for the nurse to give him a call back.

## 2021-03-31 NOTE — Telephone Encounter (Signed)
Noted, patient is aware and do as you requested.

## 2021-03-31 NOTE — Telephone Encounter (Signed)
Please contact him regarding message below

## 2021-04-01 NOTE — Telephone Encounter (Signed)
Agree with recommendations.  

## 2021-04-06 ENCOUNTER — Other Ambulatory Visit: Payer: Self-pay

## 2021-04-06 ENCOUNTER — Ambulatory Visit (INDEPENDENT_AMBULATORY_CARE_PROVIDER_SITE_OTHER): Payer: Medicare Other | Admitting: Cardiovascular Disease

## 2021-04-06 ENCOUNTER — Other Ambulatory Visit: Payer: Self-pay | Admitting: Endocrinology

## 2021-04-06 VITALS — BP 124/82 | HR 70 | Ht 69.0 in | Wt 290.8 lb

## 2021-04-06 DIAGNOSIS — I5022 Chronic systolic (congestive) heart failure: Secondary | ICD-10-CM

## 2021-04-06 DIAGNOSIS — Z950 Presence of cardiac pacemaker: Secondary | ICD-10-CM

## 2021-04-06 DIAGNOSIS — I2510 Atherosclerotic heart disease of native coronary artery without angina pectoris: Secondary | ICD-10-CM | POA: Diagnosis not present

## 2021-04-06 DIAGNOSIS — Z951 Presence of aortocoronary bypass graft: Secondary | ICD-10-CM

## 2021-04-06 DIAGNOSIS — I255 Ischemic cardiomyopathy: Secondary | ICD-10-CM

## 2021-04-06 DIAGNOSIS — E1159 Type 2 diabetes mellitus with other circulatory complications: Secondary | ICD-10-CM

## 2021-04-06 DIAGNOSIS — I480 Paroxysmal atrial fibrillation: Secondary | ICD-10-CM

## 2021-04-06 DIAGNOSIS — G4733 Obstructive sleep apnea (adult) (pediatric): Secondary | ICD-10-CM

## 2021-04-06 DIAGNOSIS — Z9989 Dependence on other enabling machines and devices: Secondary | ICD-10-CM

## 2021-04-06 DIAGNOSIS — Z794 Long term (current) use of insulin: Secondary | ICD-10-CM

## 2021-04-06 DIAGNOSIS — Z7901 Long term (current) use of anticoagulants: Secondary | ICD-10-CM

## 2021-04-06 MED ORDER — CARVEDILOL 6.25 MG PO TABS: 9.3750 mg | ORAL_TABLET | Freq: Two times a day (BID) | ORAL | 3 refills | Status: DC

## 2021-04-06 NOTE — Progress Notes (Signed)
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 35 to 40% with mild LVH grade 1 diastolic dysfunction, and moderate mitral regurgitation.  Most recently, he is on a medical regimen consisting of carvedilol 6.25 mg twice a day, Entresto 49/51 mg twice a day, Farxiga which she now takes 2.5 mg twice a day, and the has a prescription for furosemide to take on an as-needed basis.  He has not had a recent echo Doppler study.  I have recommended follow-up echocardiography on his current medical regimen and hopefully LV function will have improved.  He admits to experiencing occasional palpitations.  He denies any associated presyncope.  I have suggested slight titration of carvedilol to 9.375 mg twice a day.  He has been on amiodarone but this was discontinued 6 months ago due to GI issues and his heart rate has slightly increased.  He has been maintaining sinus rhythm he was on rosuvastatin 5 mg but he apparently is no longer taking this.  Laboratory in April 2022 showed total cholesterol 199.  His HDL cholesterol had  increased to 121 off therapy and triglycerides were now 177.  I discussed improve diet.  Since he did not tolerate statin I am adding Zetia 10 mg.  He may be a candidate for future PSK 9 inhibition or incliseran.  He continues to use CPAP with excellent compliance with average use over 9 hours per night.  His increased AHI is significantly contributed by his mask leak.  We discussed new mask options and cleansing of his mask daily to reduce facial oils on the cushion which contribute to his mask leak.  He is diabetic and will be following up with Dr. Dwyane Dee.  He undergoes remote  pacemaker checks.  He continues to be on Xarelto for anticoagulation with a CHA2DS2-VASc score of 4.  I will see him in 6 months for cardiology reevaluation.   Medication Adjustments/Labs and Tests Ordered: Current medicines are reviewed at length with the patient today.  Concerns regarding medicines are outlined above.  Medication changes, Labs and Tests ordered today are listed in the Patient Instructions below. Patient Instructions  Medication Instructions:  INCREASE carvedilol to 9.375 (1.5 tablets) twice daily.  *If you need a refill on your cardiac medications before your next appointment, please call your pharmacy*   Lab Work: None ordered.    Testing/Procedures: Your physician has requested that you have an echocardiogram. Echocardiography is a painless test that uses sound waves to create images of your heart. It provides your doctor with information about the size and shape of your heart and how well your heart's chambers and valves are working. This procedure takes approximately one hour. There are no restrictions for this procedure.     Follow-Up: At [facility], you and your health needs are our priority.  As part of our continuing mission to provide you with exceptional heart care, we have created designated Provider Care Teams.  These Care Teams include your primary Cardiologist (physician) and  Advanced Practice Providers (APPs -  Physician Assistants and Nurse Practitioners) who all work together to provide you with the care you need, when you need it.  We recommend signing up for the patient portal called "MyChart".  Sign up information is provided on this After Visit Summary.  MyChart is used to connect with patients for Virtual Visits (Telemedicine).  Patients are able to view lab/test results, encounter notes, upcoming appointments, etc.  Non-urgent messages can be sent to your provider as well.   To learn more about what you can do with MyChart, go to [email or web address].    Your next appointment:   6 month(s)  The format for your next appointment:   In Person  Provider:   Shelva Majestic, MD       Signed, Shelva Majestic, MD  04/13/2021 8:26 PM    [facility] [address] [place], [place], [place]  [address] Phone: [phone]

## 2021-04-06 NOTE — Patient Instructions (Signed)
Medication Instructions:  INCREASE carvedilol to 9.375 (1.5 tablets) twice daily.  *If you need a refill on your cardiac medications before your next appointment, please call your pharmacy*   Lab Work: None ordered.    Testing/Procedures: Your physician has requested that you have an echocardiogram. Echocardiography is a painless test that uses sound waves to create images of your heart. It provides your doctor with information about the size and shape of your heart and how well your heart's chambers and valves are working. This procedure takes approximately one hour. There are no restrictions for this procedure.     Follow-Up: At [facility], you and your health needs are our priority.  As part of our continuing mission to provide you with exceptional heart care, we have created designated Provider Care Teams.  These Care Teams include your primary Cardiologist (physician) and Advanced Practice Providers (APPs -  Physician Assistants and Nurse Practitioners) who all work together to provide you with the care you need, when you need it.  We recommend signing up for the patient portal called "MyChart".  Sign up information is provided on this After Visit Summary.  MyChart is used to connect with patients for Virtual Visits (Telemedicine).  Patients are able to view lab/test results, encounter notes, upcoming appointments, etc.  Non-urgent messages can be sent to your provider as well.   To learn more about what you can do with MyChart, go to [email or web address].    Your next appointment:   6 month(s)  The format for your next appointment:   In Person  Provider:   Shelva Majestic, MD

## 2021-04-07 ENCOUNTER — Other Ambulatory Visit: Payer: Self-pay | Admitting: Endocrinology

## 2021-04-13 ENCOUNTER — Encounter: Payer: Self-pay | Admitting: Cardiovascular Disease

## 2021-04-21 ENCOUNTER — Ambulatory Visit (INDEPENDENT_AMBULATORY_CARE_PROVIDER_SITE_OTHER): Payer: Medicare Other

## 2021-04-21 DIAGNOSIS — I5022 Chronic systolic (congestive) heart failure: Secondary | ICD-10-CM | POA: Diagnosis not present

## 2021-04-21 DIAGNOSIS — Z95810 Presence of automatic (implantable) cardiac defibrillator: Secondary | ICD-10-CM | POA: Diagnosis not present

## 2021-04-22 ENCOUNTER — Telehealth: Payer: Self-pay

## 2021-04-22 NOTE — Progress Notes (Signed)
EPIC Encounter for ICM Monitoring  Patient Name: Charles [name] is a 75 y.o. male Date: 04/22/2021 Primary Care Physican: Cyndi Bender, PA-C Primary Cardiologist:Kelly Electrophysiologist:[name] Bi-V Pacing:96% 4/22/2022Weight:293lbs  AT/AF Burden: 0%  Attempted call to patient and unable to reach.  Left detailed message per DPR regarding transmission. Transmission reviewed. .    CorVue thoracic impedancenormal but was suggesting possible fluid accumulation from 5/13-5/22.  Prescribed:  Furosemide40 mg take 20 mg by mouth as needed Xarelto 20 mg take 1 tablet at supper daily  Recommendations: Left voice mail with ICM number and encouraged to call if experiencing any fluid symptoms.  Follow-up plan: ICM clinic phone appointment on7/03/2021. 91 day device clinic remote transmission6/27/2022.   EP/Cardiology Office Visits: Patient aware to contact Dr Evette Georges office for overdue appointment. Recall 3/12/2022withAndy Tillery,PA. Recall 07/07/2021 with Dr [name].  Copy of ICM check sent to Dr.[name]  3 month ICM trend: 04/21/2021.    1 Year ICM trend:       Rosalene Billings, RN 04/22/2021 1:43 PM

## 2021-04-22 NOTE — Telephone Encounter (Signed)
Remote ICM transmission received.  Attempted call to patient regarding ICM remote transmission and left detailed message per DPR.  Advised to return call for any fluid symptoms or questions. Next ICM remote transmission scheduled 05/26/2021.

## 2021-04-23 ENCOUNTER — Telehealth: Payer: Self-pay | Admitting: Cardiovascular Disease

## 2021-04-23 NOTE — Telephone Encounter (Signed)
Spoke to patient he stated he recently saw Dr.Kelly.He increased Carvedilol 6.25 mg 1&1/2 tablets twice a day.Stated his B/P 143/87,143/84,182/92.Pulse 89,70,77.Stated he has had another episode the first of week in which he felt faint.He did not faint.Stated he told Dr.Kelly about these episodes.He is scheduled to have a echo 05/07/21. He wanted to let Dr.Kelly know he is not any better.He started taking beet chews 2 weeks ago he wanted to know if ok to take.Advised Dr.Kelly is out of office.I will send message to him.Advised to go to ED if needed. Advised I will send message to our pharmacist to see if ok to take beet chews.

## 2021-04-23 NOTE — Telephone Encounter (Signed)
   Pt c/o medication issue:  1. Name of Medication: total beets supplement, collagen and zinc  2. How are you currently taking this medication (dosage and times per day)?   3. Are you having a reaction (difficulty breathing--STAT)?   4. What is your medication issue? Pt would like to ask Dr. Claiborne Billings if its ok to take theses supplements with his heart meds

## 2021-04-24 NOTE — Telephone Encounter (Signed)
Should not be an issue taking beet chews

## 2021-04-24 NOTE — Telephone Encounter (Signed)
Spoke to patient pharmacist advised ok to take beet chews.B/P this morning 115/60 pulse 76.Advised we will call back after Dr.Kelly reviews message.

## 2021-04-28 ENCOUNTER — Telehealth: Payer: Self-pay | Admitting: Internal Medicine

## 2021-04-28 NOTE — Telephone Encounter (Signed)
Spoke to patient he stated he is not feeling good.Stated he feels weaker since he increased Carvedilol 6.25 mg 1&1/2 tablets twice a day.B/P has been ranging 122/73 to 127/67 pulse 82,85.This morning B/P 96/50 pulse 82.Advised if systolic B/P below 220 this afternoon to hold pm dose of Carvedilol.Stated he had another episode this past Sunday he felt faint.Advised I will send message to [facility] for advice.

## 2021-04-28 NOTE — Telephone Encounter (Signed)
    Pt c/o Shortness Of Breath: STAT if SOB developed within the last 24 hours or pt is noticeably SOB on the phone  1. Are you currently SOB (can you hear that pt is SOB on the phone)? No  2. How long have you been experiencing SOB? Since after visit on 04/06/21  3. Are you SOB when sitting or when up moving around? Moving around  4. Are you currently experiencing any other symptoms? Pt said since his visit with Dr. Claiborne Billings he gets SOB when he got outside, he said it gets so bad that he feels lightheadedness and and feels like he is going to pass out. He wanted to speak with Sharman Cheek

## 2021-04-28 NOTE — Telephone Encounter (Signed)
Spoke with patient of Dr. Claiborne Billings - he reports shortness of breath for about 1 month, occurs with activities. He does work out with his [employer]. He can experience lightheadedness with position changes.   He has already spoke with Malachy Mood LPN - advised him a message has been sent to MD

## 2021-04-28 NOTE — Telephone Encounter (Addendum)
Spoke with patient and reviewed remote transmission.  Advised report suggesting possible dryness since 04/23/2021. He reports taking PRN Furosemide 20 mg on 6/4 which caused more suggested dryness per report.  Advised to drink a minimum of 64 oz fluid and for the next 2 days to drink about 70-74 oz to help with hydration. Advised to avoid taking PRN Furosemide for the next couple of days.    Advised Dr Evette Georges office will call him back for any recommendations and the office will be able to review the current remote transmission.

## 2021-04-28 NOTE — Telephone Encounter (Addendum)
Spoke with patient.  He reports feeling SOB, weak, jittery, and lightheaded. Hand tremor is worse than normal.  He says symptoms occur during normal exertion of activities.  He thinks his pulse rate is in the 80's, BP and BS are normal.  Requested he send a remote transmission for review.  He said he has discussed these symptoms at OV with Dr Claiborne Billings last month and Dr Claiborne Billings advised him to call the office if they continue to occur which is reason for call today.    04/28/2021 Merlin report  Corvue thoracic impedance suggesting possible dryness since 04/23/2021.

## 2021-05-07 ENCOUNTER — Ambulatory Visit (HOSPITAL_COMMUNITY): Payer: Medicare Other | Attending: Internal Medicine

## 2021-05-07 ENCOUNTER — Other Ambulatory Visit: Payer: Self-pay

## 2021-05-07 DIAGNOSIS — I255 Ischemic cardiomyopathy: Secondary | ICD-10-CM

## 2021-05-07 LAB — ECHOCARDIOGRAM COMPLETE
Area-P 1/2: 2.72 cm2
MV M vel: 4.78 m/s
MV Peak grad: 91.4 mmHg
S' Lateral: 3.2 cm

## 2021-05-11 ENCOUNTER — Telehealth: Payer: Self-pay | Admitting: Cardiovascular Disease

## 2021-05-11 NOTE — Telephone Encounter (Signed)
Pt updated with ECHO results and verbalized understanding.  

## 2021-05-11 NOTE — Telephone Encounter (Signed)
Patient returning a call to discuss Echo results

## 2021-05-11 NOTE — Telephone Encounter (Signed)
   Pt is returning call, he is just requesting copy of his echo result send to his address

## 2021-05-11 NOTE — Telephone Encounter (Signed)
Returned call to patient, left message advising that a copy of patients Echo results have been mailed to address on file. Advised to call back with any issues, questions, or concerns.

## 2021-05-15 ENCOUNTER — Telehealth: Payer: Self-pay | Admitting: Endocrinology

## 2021-05-15 ENCOUNTER — Encounter: Payer: Self-pay | Admitting: Endocrinology

## 2021-05-15 NOTE — Telephone Encounter (Signed)
Patient called to request his Lab Orders be faxed to [facility] in [place] (fax #'s listed below) for Patient's Lab appointment  at [facility] scheduled for 05/20/21.  Lab Orders will need to be faxed to both Fax#'s listed below:  [facility] in [place] # [phone] FAX # [phone]

## 2021-05-18 ENCOUNTER — Ambulatory Visit (INDEPENDENT_AMBULATORY_CARE_PROVIDER_SITE_OTHER): Payer: Medicare Other

## 2021-05-18 ENCOUNTER — Other Ambulatory Visit: Payer: Self-pay

## 2021-05-18 DIAGNOSIS — E1165 Type 2 diabetes mellitus with hyperglycemia: Secondary | ICD-10-CM

## 2021-05-18 DIAGNOSIS — I495 Sick sinus syndrome: Secondary | ICD-10-CM | POA: Diagnosis not present

## 2021-05-18 DIAGNOSIS — E782 Mixed hyperlipidemia: Secondary | ICD-10-CM

## 2021-05-18 DIAGNOSIS — Z794 Long term (current) use of insulin: Secondary | ICD-10-CM

## 2021-05-18 NOTE — Telephone Encounter (Signed)
Spoke to patient Dr.Kelly's advice given.Stated he only takes Lasix daily if needed.Stated since he called he has 2 more episodes in which he feels very weak feels like he is going to pass out.Stated he has not passed out.He has been having these episodes for the past 2 months.Appointment scheduled with Laurann Montana PA at [place] location 7/11 at 10:00 am.Advised to go to ED if needed.Advised I will make Dr.Kelly aware.

## 2021-05-18 NOTE — Telephone Encounter (Signed)
If patient does not have any edema, recommend discontinuing Lasix and change to a as needed basis rather than daily.

## 2021-05-19 LAB — CUP PACEART REMOTE DEVICE CHECK
Battery Remaining Longevity: 71 mo
Battery Remaining Percentage: 78 %
Battery Voltage: 2.99 V
Brady Statistic AP VP Percent: 90 %
Brady Statistic AP VS Percent: 1 %
Brady Statistic AS VP Percent: 6.1 %
Brady Statistic AS VS Percent: 1 %
Brady Statistic RA Percent Paced: 87 %
Date Time Interrogation Session: 20220627020026
Implantable Lead Implant Date: 20091007
Implantable Lead Implant Date: 20091007
Implantable Lead Implant Date: 20200922
Implantable Lead Location: 753858
Implantable Lead Location: 753859
Implantable Lead Location: 753860
Implantable Pulse Generator Implant Date: 20200922
Lead Channel Impedance Value: 1300 Ohm
Lead Channel Impedance Value: 450 Ohm
Lead Channel Impedance Value: 650 Ohm
Lead Channel Pacing Threshold Amplitude: 0.75 V
Lead Channel Pacing Threshold Amplitude: 0.75 V
Lead Channel Pacing Threshold Amplitude: 1.25 V
Lead Channel Pacing Threshold Pulse Width: 0.4 ms
Lead Channel Pacing Threshold Pulse Width: 0.4 ms
Lead Channel Pacing Threshold Pulse Width: 0.5 ms
Lead Channel Sensing Intrinsic Amplitude: 12 mV
Lead Channel Sensing Intrinsic Amplitude: 2.4 mV
Lead Channel Setting Pacing Amplitude: 2 V
Lead Channel Setting Pacing Amplitude: 2.25 V
Lead Channel Setting Pacing Amplitude: 2.5 V
Lead Channel Setting Pacing Pulse Width: 0.4 ms
Lead Channel Setting Pacing Pulse Width: 0.5 ms
Lead Channel Setting Sensing Sensitivity: 2 mV
Pulse Gen Model: 3562
Pulse Gen Serial Number: 9157656

## 2021-05-20 LAB — LIPID PANEL
Cholesterol: 165 (ref 0–200)
HDL: 42 (ref 35–70)
LDL Cholesterol: 86
LDl/HDL Ratio: 2
Triglycerides: 217 — AB (ref 40–160)

## 2021-05-20 LAB — HEMOGLOBIN A1C: Hemoglobin A1C: 6.9

## 2021-05-22 ENCOUNTER — Other Ambulatory Visit: Payer: Self-pay | Admitting: Endocrinology

## 2021-05-22 ENCOUNTER — Encounter: Payer: Self-pay | Admitting: Endocrinology

## 2021-05-22 ENCOUNTER — Other Ambulatory Visit: Payer: Self-pay

## 2021-05-22 NOTE — Telephone Encounter (Signed)
Sorry for delay just seen message. Labs were faxed.

## 2021-05-26 ENCOUNTER — Ambulatory Visit (INDEPENDENT_AMBULATORY_CARE_PROVIDER_SITE_OTHER): Payer: Medicare Other

## 2021-05-26 DIAGNOSIS — I5022 Chronic systolic (congestive) heart failure: Secondary | ICD-10-CM | POA: Diagnosis not present

## 2021-05-26 DIAGNOSIS — Z95810 Presence of automatic (implantable) cardiac defibrillator: Secondary | ICD-10-CM | POA: Diagnosis not present

## 2021-05-27 NOTE — Progress Notes (Signed)
EPIC Encounter for ICM Monitoring  Patient Name: Charles [name] is a 75 y.o. male Date: 05/27/2021 Primary Care Physican: Cyndi Bender, PA-C Primary Cardiologist: Claiborne Billings Electrophysiologist: [name] Bi-V Pacing:  96%         05/27/2021 Weight: 293 lbs    AT/AF Burden: 0%                                                          Spoke with patient and heart failure questions reviewed.  Pt reports having an episodes of weakness and trembling in right hands worse.  He has been evaluated for Parkinson's but he does not have it.     CorVue thoracic impedance normal.   Prescribed: Furosemide 40 mg take 0.5 tablet (20 mg total) as needed Xarelto 20 mg take 1 tablet at supper daily   Recommendations:  No changes and encouraged to call if experiencing any fluid symptoms.   Follow-up plan: ICM clinic phone appointment on 06/29/2021.   91 day device clinic remote transmission 08/17/2021.   EP/Cardiology Office Visits:  06/01/2021 with Terie Purser, NP. Recall 07/07/2021 with Dr [name].   10/03/2021 with Dr Claiborne Billings.   Copy of ICM check sent to Dr. [name].   3 month ICM trend: 05/25/2021.    1 Year ICM trend:       Rosalene Billings, RN 05/27/2021 12:00 PM

## 2021-05-28 ENCOUNTER — Ambulatory Visit (INDEPENDENT_AMBULATORY_CARE_PROVIDER_SITE_OTHER): Payer: Medicare Other | Admitting: Endocrinology

## 2021-05-28 ENCOUNTER — Other Ambulatory Visit: Payer: Self-pay

## 2021-05-28 VITALS — BP 128/84 | HR 74 | Ht 69.0 in | Wt 291.0 lb

## 2021-05-28 DIAGNOSIS — E1165 Type 2 diabetes mellitus with hyperglycemia: Secondary | ICD-10-CM | POA: Diagnosis not present

## 2021-05-28 DIAGNOSIS — I255 Ischemic cardiomyopathy: Secondary | ICD-10-CM | POA: Diagnosis not present

## 2021-05-28 DIAGNOSIS — N289 Disorder of kidney and ureter, unspecified: Secondary | ICD-10-CM | POA: Diagnosis not present

## 2021-05-28 DIAGNOSIS — Z794 Long term (current) use of insulin: Secondary | ICD-10-CM

## 2021-05-28 MED ORDER — METFORMIN HCL ER 500 MG PO TB24: 1000.0000 mg | ORAL_TABLET | Freq: Every day | ORAL | 3 refills | Status: DC

## 2021-05-28 NOTE — Patient Instructions (Addendum)
Tresiba 44 units and keep am sugar 120-140  25 Novolog for Cereal  Metfomin 1 tab at supper for 1 week then 2 daily  More sugars at bedtime
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
## 2021-06-01 ENCOUNTER — Other Ambulatory Visit: Payer: Self-pay

## 2021-06-01 ENCOUNTER — Ambulatory Visit (INDEPENDENT_AMBULATORY_CARE_PROVIDER_SITE_OTHER): Payer: Medicare Other | Admitting: Family

## 2021-06-01 ENCOUNTER — Encounter (HOSPITAL_BASED_OUTPATIENT_CLINIC_OR_DEPARTMENT_OTHER): Payer: Self-pay | Admitting: Family

## 2021-06-01 VITALS — BP 130/70 | HR 70 | Ht 69.0 in | Wt 295.2 lb

## 2021-06-01 DIAGNOSIS — I6523 Occlusion and stenosis of bilateral carotid arteries: Secondary | ICD-10-CM | POA: Diagnosis not present

## 2021-06-01 DIAGNOSIS — R55 Syncope and collapse: Secondary | ICD-10-CM | POA: Diagnosis not present

## 2021-06-01 DIAGNOSIS — Z951 Presence of aortocoronary bypass graft: Secondary | ICD-10-CM | POA: Diagnosis not present

## 2021-06-01 DIAGNOSIS — Z950 Presence of cardiac pacemaker: Secondary | ICD-10-CM

## 2021-06-01 DIAGNOSIS — I5022 Chronic systolic (congestive) heart failure: Secondary | ICD-10-CM | POA: Diagnosis not present

## 2021-06-01 DIAGNOSIS — Z7901 Long term (current) use of anticoagulants: Secondary | ICD-10-CM

## 2021-06-01 DIAGNOSIS — I480 Paroxysmal atrial fibrillation: Secondary | ICD-10-CM
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
## 2021-06-01 NOTE — Patient Instructions (Addendum)
Medication Instructions:   Your physician has recommended you make the following change in your medication:   Check blood pressure prior to medications  IF your blood pressure before medications is less than 110 for the top number - hold Entresto  Try to take your medications as close to 12 hours apart as possible  *If you need a refill on your cardiac medications before your next appointment, please call your pharmacy*  Lab Work: None ordered today   Testing/Procedures: Your physician has requested that you have a carotid duplex on Monday, July 18 @ 2:30 pm.  This test is an ultrasound of the carotid arteries in your neck. It looks at blood flow through these arteries that supply the brain with blood. Allow one hour for this exam. There are no restrictions or special instructions.    Follow-Up: At [facility], you and your health needs are our priority.  As part of our continuing mission to provide you with exceptional heart care, we have created designated Provider Care Teams.  These Care Teams include your primary Cardiologist (physician) and Advanced Practice Providers (APPs -  Physician Assistants and Nurse Practitioners) who all work together to provide you with the care you need, when you need it.  We recommend signing up for the patient portal called "MyChart".  Sign up information is provided on this After Visit Summary.  MyChart is used to connect with patients for Virtual Visits (Telemedicine).  Patients are able to view lab/test results, encounter notes, upcoming appointments, etc.  Non-urgent messages can be sent to your provider as well.   To learn more about what you can do with MyChart, go to [email or web address].    Your next appointment:    After carotid duplex  The format for your next appointment:   In Person with Dr.Kelly on Friday, August 26 @ 4:00 pm.   Provider:   You may see Shelva Majestic, MD or one of the following Advanced Practice Providers on your  designated Care Team:      Other Instructions  Bring your blood pressure cuff to your next office visit.   Heart Healthy Diet Recommendations: A low-salt diet is recommended. Meats should be grilled, baked, or boiled. Avoid fried foods. Focus on lean protein sources like fish or chicken with vegetables and fruits. The American Heart Association is a Microbiologist!  American Heart Association Diet and Lifeystyle Recommendations   Exercise recommendations: The American Heart Association recommends 150 minutes of moderate intensity exercise weekly. Try 30 minutes of moderate intensity exercise 4-5 times per week. This could include walking, jogging, or swimming.  Orthostatic Hypotension Blood pressure is a measurement of how strongly, or weakly, your blood is pressing against the walls of your arteries. Orthostatic hypotension is a sudden drop in blood pressure that happens when you quickly change positions,such as when you get up from sitting or lying down. Arteries are blood vessels that carry blood from your heart throughout your body. When blood pressure is too low, you may not get enough blood to your brain or to the rest of your organs. This can cause weakness, light-headedness, rapid heartbeat, and fainting. This can last for just a few seconds or for up to a few minutes. Orthostatic hypotension is usually not a serious problem. However, if it happens frequently or gets worse, it may be a sign of somethingmore serious. What are the causes? This condition may be caused by: Sudden changes in posture, such as standing up quickly after you have been  sitting or lying down. Blood loss. Loss of body fluids (dehydration). Heart problems. Hormone (endocrine) problems. Pregnancy. Severe infection. Lack of certain nutrients. Severe allergic reactions (anaphylaxis). Certain medicines, such as blood pressure medicine or medicines that make the body lose excess fluids (diuretics). Sometimes,  this condition can be caused by not taking medicine as directed, such as taking too much of a certain medicine. What increases the risk? The following factors may make you more likely to develop this condition: Age. Risk increases as you get older. Conditions that affect the heart or the central nervous system. Taking certain medicines, such as blood pressure medicine or diuretics. Being pregnant. What are the signs or symptoms? Symptoms of this condition may include: Weakness. Light-headedness. Dizziness. Blurred vision. Fatigue. Rapid heartbeat. Fainting, in severe cases. How is this diagnosed? This condition is diagnosed based on: Your medical history. Your symptoms. Your blood pressure measurement. Your health care provider will check your blood pressure when you are: Lying down. Sitting. Standing. A blood pressure reading is recorded as two numbers, such as "120 over 80" (or 120/80). The first ("top") number is called the systolic pressure. It is a measure of the pressure in your arteries as your heart beats. The second ("bottom") number is called the diastolic pressure. It is a measure of the pressure in your arteries when your heart relaxes between beats. Blood pressure is measured in a unit called mm Hg. Healthy blood pressure for most adults is 120/80. If your blood pressure is below 90/60, you may be diagnosed withhypotension. Other information or tests that may be used to diagnose orthostatic hypotension include: Your other vital signs, such as your heart rate and temperature. Blood tests. Tilt table test. For this test, you will be safely secured to a table that moves you from a lying position to an upright position. Your heart rhythm and blood pressure will be monitored during the test. How is this treated? This condition may be treated by: Changing your diet. This may involve eating more salt (sodium) or drinking more water. Taking medicines to raise your blood  pressure. Changing the dosage of certain medicines you are taking that might be lowering your blood pressure. Wearing compression stockings. These stockings help to prevent blood clots and reduce swelling in your legs. In some cases, you may need to go to the hospital for: Fluid replacement. This means you will receive fluids through an IV. Blood replacement. This means you will receive donated blood through an IV (transfusion). Treating an infection or heart problems, if this applies. Monitoring. You may need to be monitored while medicines that you are taking wear off. Follow these instructions at home: Eating and drinking  Drink enough fluid to keep your urine pale yellow. Eat a healthy diet, and follow instructions from your health care provider about eating or drinking restrictions. A healthy diet includes: Fresh fruits and vegetables. Whole grains. Lean meats. Low-fat dairy products. Eat extra salt only as directed. Do not add extra salt to your diet unless your health care provider told you to do that. Eat frequent, small meals. Avoid standing up suddenly after eating.  Medicines Take over-the-counter and prescription medicines only as told by your health care provider. Follow instructions from your health care provider about changing the dosage of your current medicines, if this applies. Do not stop or adjust any of your medicines on your own. General instructions  Wear compression stockings as told by your health care provider. Get up slowly from lying down or  sitting positions. This gives your blood pressure a chance to adjust. Avoid hot showers and excessive heat as directed by your health care provider. Return to your normal activities as told by your health care provider. Ask your health care provider what activities are safe for you. Do not use any products that contain nicotine or tobacco, such as cigarettes, e-cigarettes, and chewing tobacco. If you need help quitting,  ask your health care provider. Keep all follow-up visits as told by your health care provider. This is important.  Contact a health care provider if you: Vomit. Have diarrhea. Have a fever for more than 2-3 days. Feel more thirsty than usual. Feel weak and tired. Get help right away if you: Have chest pain. Have a fast or irregular heartbeat. Develop numbness in any part of your body. Cannot move your arms or your legs. Have trouble speaking. Become sweaty or feel light-headed. Faint. Feel short of breath. Have trouble staying awake. Feel confused. Summary Orthostatic hypotension is a sudden drop in blood pressure that happens when you quickly change positions. Orthostatic hypotension is usually not a serious problem. It is diagnosed by having your blood pressure taken lying down, sitting, and then standing. It may be treated by changing your diet or adjusting your medicines. This information is not intended to replace advice given to you by your health care provider. Make sure you discuss any questions you have with your healthcare provider. Document Revised: 05/04/2018 Document Reviewed: 05/04/2018 Elsevier Patient Education  [place].

## 2021-06-05 NOTE — Progress Notes (Signed)
Remote pacemaker transmission.   

## 2021-06-08 ENCOUNTER — Other Ambulatory Visit (HOSPITAL_COMMUNITY): Payer: Self-pay | Admitting: Family

## 2021-06-08 ENCOUNTER — Ambulatory Visit (HOSPITAL_COMMUNITY)
Admission: RE | Admit: 2021-06-08 | Discharge: 2021-06-08 | Disposition: A | Discharge status: Home / Self Care | Payer: Medicare Other | Source: Ambulatory Visit | Attending: Cardiovascular Disease | Admitting: Cardiovascular Disease

## 2021-06-08 ENCOUNTER — Other Ambulatory Visit: Payer: Self-pay

## 2021-06-08 DIAGNOSIS — Z951 Presence of aortocoronary bypass graft: Secondary | ICD-10-CM | POA: Diagnosis not present

## 2021-06-08 DIAGNOSIS — R55 Syncope and collapse: Secondary | ICD-10-CM | POA: Diagnosis present

## 2021-06-08 DIAGNOSIS — I6523 Occlusion and stenosis of bilateral carotid arteries: Secondary | ICD-10-CM

## 2021-06-08 DIAGNOSIS — I5022 Chronic systolic (congestive) heart failure: Secondary | ICD-10-CM | POA: Insufficient documentation

## 2021-06-12 ENCOUNTER — Telehealth: Payer: Self-pay | Admitting: Cardiovascular Disease

## 2021-06-12 ENCOUNTER — Telehealth: Payer: Self-pay

## 2021-06-12 MED ORDER — ENTRESTO 24-26 MG PO TABS
1.0000 | ORAL_TABLET | Freq: Two times a day (BID) | ORAL | 0 refills | Status: DC
Start: 2021-06-12 — End: 2021-10-13

## 2021-06-12 NOTE — Telephone Encounter (Signed)
Patient called, asked if the office had prescribed his alfuzosin, as he needed a refill. Advised patient nurse could not find any evidence the office had prescribed him this medication. Pt verbalized understanding, reports he will call his other doctor for a refill.

## 2021-06-12 NOTE — Telephone Encounter (Signed)
Device check with no significant arrhthymias.   Amy - thank you for collecting!  Mable Fill - will you please let Mr. Jupiter know his device showed no significant arrhythmia? Proceed with plan as previously discussed.  Best,  Loel Dubonnet, NP

## 2021-06-12 NOTE — Telephone Encounter (Signed)
Called patient, relayed information from Laurann Montana, NP:  Loel Dubonnet, NP   Nurse Practitioner  Specialty:  Cardiology  Telephone Encounter  Signed  Creation Time:  06/12/2021  1:10 PM           Signed      Device check with no significant arrhthymias        Proceed with plan as previously discussed.  Pt verbalized understanding. Nurse reminded pt again that if he has chest pain, shortness of breath, syncope, or other concerning symptoms he should be taken to the emergency department or call 911. Pt verbalized understanding.

## 2021-06-12 NOTE — Telephone Encounter (Signed)
He does have a device - can we reach out to device clinic to see if they can pull a report to assess for any arrhythmia as contributory to near syncope?  Recommend reducing Entresto to 24-'26mg'$  BID in case orthostatic hypotension is contributory.   Follow up as scheduled with Dr. Claiborne Billings.   Loel Dubonnet, NP

## 2021-06-12 NOTE — Telephone Encounter (Signed)
Pt is returning a call  

## 2021-06-12 NOTE — Telephone Encounter (Signed)
Called patient back, relayed orders from Laurann Montana:  Loel Dubonnet, NP   Nurse Practitioner  Specialty:  Cardiology  Telephone Encounter  Signed  Creation Time:  06/12/2021 11:10 AM           Signed      He does have a device - can we reach out to device clinic to see if they can pull a report to assess for any arrhythmia as contributory to near syncope?   Recommend reducing Entresto to 24-'26mg'$  BID in case orthostatic hypotension is contributory.   Follow up as scheduled with Dr. Claiborne Billings.    Loel Dubonnet, NP          Pt verbalized understanding. Orders for [facility] sent to pharmacy. Pt verbalized understanding of medication instructions and to keep follow up. Nurse to contact device clinic.

## 2021-06-12 NOTE — Telephone Encounter (Signed)
Spoke with pt, requested manual transmission.    Manual transmission received/ reviewed: Normal device function.  Presenting rate/ rhythm AP/BiVP 90s.  No arrythmias logged

## 2021-06-12 NOTE — Telephone Encounter (Signed)
This RN called pt back, relayed result from Laurann Montana, NP:  Loel Dubonnet, NP  06/10/2021  7:27 AM EDT      Bilateral mild carotid stenosis stable compared to last study 2019. Good result! Not significant enough to cause symptoms. Repeat carotid duplex in 2years for monitoring.    Pt verbalized understanding. Pt reports, however that he has continued to have episodes of dizziness and lightheadedness. He reports these usually occur outside and that there is no discernable patterns for when they occur. His blood pressure today is 130/71, HR 69. Pt denies weight gain, reports swelling of feet "at the end of the day", he reports he does take his PRN Lasix when this occurs. He reports he is staying adequately hydrated and is monitoring salt intake. He also reports he had an episode yesterday, and afterwards felt "wiped out", he is not clear if he passed out or fell asleep. Nurse advised she would consult Laurann Montana for further advice, told patient in the interim if he experiences chest pain, syncope/near syncope, shortness of breath or other concerning symptoms to have someone take him to the emergency room or call 911.

## 2021-06-20 ENCOUNTER — Other Ambulatory Visit: Payer: Self-pay | Admitting: Endocrinology

## 2021-06-29 ENCOUNTER — Telehealth: Payer: Self-pay | Admitting: Cardiovascular Disease

## 2021-06-29 ENCOUNTER — Ambulatory Visit (INDEPENDENT_AMBULATORY_CARE_PROVIDER_SITE_OTHER): Payer: Medicare Other

## 2021-06-29 DIAGNOSIS — I5022 Chronic systolic (congestive) heart failure: Secondary | ICD-10-CM | POA: Diagnosis not present

## 2021-06-29 DIAGNOSIS — Z950 Presence of cardiac pacemaker: Secondary | ICD-10-CM

## 2021-06-29 NOTE — Telephone Encounter (Signed)
I contacted patient, he states that on Saturday 08/06 he was working outside when he became weak and his vision went foggy. He states he sat down on his well outdoors and waited, and it got better but then got back to doing what he was doing before and it came back again and he passed out. He states that it only lasted a few moments and got better. He was bending over during these episodes spraying weed killer on his yard. He did mention that his BP that day was 109/58 in the morning, and then after these episodes was 91/43 HR 68. He did not take his Carvedilol this day as his numbers were already low and he was feeling badly. He denied any other symptoms before, during or after his episode but just mentions being fatigued after. His BP today 08/08 this morning was 93/47 No HR was given this morning. He has not taken his BP medications as of yet today. He has been monitoring his BP throughout the day. He feels okay today but states he is just tired feeling. I did advise it sounded like a possible vasovagal syncope episode occurred on Saturday. We did discuss what this was, I did advise I would route to MD to advise further of any adjustments to medications and/or testing options. Patient verbalized understanding. He will continue to monitor BP, we also discussed ways to get the BP to increase slightly to see if it made him feel better.  Will route to MD and will call back patient with response.

## 2021-06-29 NOTE — Telephone Encounter (Signed)
Pt c/o Syncope: STAT if syncope occurred within 30 minutes and pt complains of lightheadedness High Priority if episode of passing out, completely, today or in last 24 hours   Did you pass out today?  No    When is the last time you passed out?  06/27/21   Has this occurred multiple times? Patient states this occurred twice on 06/27/21   Did you have any symptoms prior to passing out?  No

## 2021-06-29 NOTE — Telephone Encounter (Signed)
Try reducing carvedilol down to 6.25 mg twice a day.

## 2021-06-30 NOTE — Telephone Encounter (Signed)
Called patient, LVM to call back to discuss change in medication.  Left call back number.

## 2021-07-01 MED ORDER — CARVEDILOL 6.25 MG PO TABS: 6.2500 mg | ORAL_TABLET | Freq: Two times a day (BID) | ORAL | 1 refills | Status: DC

## 2021-07-01 NOTE — Telephone Encounter (Signed)
Called patient, advised of message from MD.  Patient verbalized understanding. Will call back with any issues.

## 2021-07-01 NOTE — Progress Notes (Signed)
EPIC Encounter for ICM Monitoring  Patient Name: Charles [name] is a 75 y.o. male Date: 07/01/2021 Primary Care Physican: Cyndi Bender, PA-C Primary Cardiologist: Claiborne Billings Electrophysiologist: [name] Bi-V Pacing:  96%         05/27/2021 Weight: 293 lbs    AT/AF Burden: 0%                                                          Spoke with patient and heart failure questions reviewed. Pt contacted Dr Evette Georges office on 8/8 regarding new syncope episodes on 8/6.  Reviewed dehydration symptoms and advised feeling like passing out may be related to dryness episodes as well it can lower BP.  Pt felt like he had some fluid yesterday and took 20 mg Furosemide tablet.   CorVue thoracic impedance suggesting episodes of dryness on 7/21-7/27, 7/29-81 and 8/6.  Also suggesting possible fluid accumulation episodes on 7/11-7/18 and 8/4-8/5.   Prescribed: Furosemide 40 mg take 0.5 tablet (20 mg total) as needed Xarelto 20 mg take 1 tablet at supper daily   Recommendations  Advised to keep fluid intake at 64 oz and will need to increase if in the heat working/sweating.    Follow-up plan: ICM clinic phone appointment on 07/15/2021 to recheck prior to Dr Evette Georges OV.   91 day device clinic remote transmission 08/17/2021.   EP/Cardiology Office Visits:  Recall 07/07/2021 with Dr [name].  07/17/2021 with Dr Claiborne Billings.   Copy of ICM check sent to Dr. [name] and sent to Dr Claiborne Billings as Juluis Rainier regarding dryness episodes.   3 month ICM trend: 06/29/2021.    1 Year ICM trend:       Rosalene Billings, RN 07/01/2021 3:10 PM

## 2021-07-15 ENCOUNTER — Ambulatory Visit (INDEPENDENT_AMBULATORY_CARE_PROVIDER_SITE_OTHER): Payer: Medicare Other

## 2021-07-15 DIAGNOSIS — I5022 Chronic systolic (congestive) heart failure: Secondary | ICD-10-CM

## 2021-07-15 DIAGNOSIS — Z950 Presence of cardiac pacemaker: Secondary | ICD-10-CM

## 2021-07-15 NOTE — Progress Notes (Signed)
EPIC Encounter for ICM Monitoring  Patient Name: Charles [name] is a 75 y.o. male Date: 07/15/2021 Primary Care Physican: Cyndi Bender, PA-C Primary Cardiologist: Claiborne Billings Electrophysiologist: [name] Bi-V Pacing:  96%         05/27/2021 Weight: 293 lbs    AT/AF Burden: 0%                                                          Transmission reviewed.   CorVue thoracic impedance normal but was suggesting possible fluid accumulation 8/7-8/19   Prescribed: Furosemide 40 mg take 0.5 tablet (20 mg total) as needed Xarelto 20 mg take 1 tablet at supper daily   Recommendations   No changes   Follow-up plan: ICM clinic phone appointment on 08/03/2021.   91 day device clinic remote transmission 08/17/2021.   EP/Cardiology Office Visits:  Recall 07/07/2021 with Dr [name].  07/17/2021 with Dr Claiborne Billings.   Copy of ICM check sent to Dr. [name].   3 month ICM trend: 07/15/2021.    1 Year ICM trend:       Rosalene Billings, RN 07/15/2021 5:13 PM

## 2021-07-17 ENCOUNTER — Other Ambulatory Visit: Payer: Self-pay

## 2021-07-17 ENCOUNTER — Encounter: Payer: Self-pay | Admitting: Cardiovascular Disease

## 2021-07-17 ENCOUNTER — Ambulatory Visit (INDEPENDENT_AMBULATORY_CARE_PROVIDER_SITE_OTHER): Payer: Medicare Other | Admitting: Cardiovascular Disease

## 2021-07-17 DIAGNOSIS — I255 Ischemic cardiomyopathy: Secondary | ICD-10-CM | POA: Diagnosis not present

## 2021-07-17 DIAGNOSIS — Z794 Long term (current) use of insulin: Secondary | ICD-10-CM

## 2021-07-17 DIAGNOSIS — I2510 Atherosclerotic heart disease of native coronary artery without angina pectoris: Secondary | ICD-10-CM

## 2021-07-17 DIAGNOSIS — G4733 Obstructive sleep apnea (adult) (pediatric): Secondary | ICD-10-CM | POA: Diagnosis not present

## 2021-07-17 DIAGNOSIS — E1159 Type 2 diabetes mellitus with other circulatory complications: Secondary | ICD-10-CM

## 2021-07-17 DIAGNOSIS — Z951 Presence of aortocoronary bypass graft: Secondary | ICD-10-CM | POA: Diagnosis not present

## 2021-07-17 DIAGNOSIS — E785 Hyperlipidemia, unspecified: Secondary | ICD-10-CM

## 2021-07-17 DIAGNOSIS — I480 Paroxysmal atrial fibrillation: Secondary | ICD-10-CM | POA: Diagnosis not present

## 2021-07-17 DIAGNOSIS — Z9989 Dependence on other enabling machines and devices: Secondary | ICD-10-CM

## 2021-07-17 NOTE — Progress Notes (Signed)
Cardiology Office Note    Date:  07/18/2021   ID:  Charles [name], Charles [name] January 07, 1946, MRN [id number]  PCP:  Cyndi Bender, PA-C  Cardiologist:  Shelva Majestic, MD    F/U cardiomyopathy  History of Present Illness:  Charles [name] is a 75 y.o. male who presents for a 3 month follow-up cardiology and sleep evaluation.  Charles [name] has a history of significant CAD since 1995 and has undergone numerous initial PTCAs and ultimate stent placements to his circumflex, LAD and RCA.  He has a history of PAF, sick sinus syndrome, and is status post permanent pacemaker placement.  He has a history of diabetes mellitus, hypertension, hyperlipidemia, chronic venous insufficiency,  and GERD.  Recently admitted in September 2017 with a non-STEMI and treated with PTCA by Dr. Irish Lack to his diagonal 1 and mid circumflex vessel for in-stent restenosis and was readmitted several days later.  His ejection fraction by echo was 35-40%.  He developed recurrent symptomatology leading to readmission and repeat cardiac catheterization in October was done by Dr. Tamala Julian which showed apid development of in-stent restenosis in each of the 3 sites treated in September and significant multivessel CAD, CABG revascularization surgery was recommended.  This was done by Dr. Servando Snare 09/15/2016 and he had a LIMA to the LAD, SVG to the OM, SVG to the PDA, and SVG to the diagonal.  Saw Dr. Roxy Horseman in follow-up on 10/28/2016.  He had developed a cough on lisinopril, which was discontinued.  He was continued on Xarelto for anticoagulation.  He is now taking Lasix on an as-needed basis.  He is on insulin for his diabetes.  He is no longer taking lisinopril.  He is on sotalol 120 mg twice a day and low-dose Crestor 5 mg on Monday, Wednesday and Fridays.    Since his evaluation in April 2018 he had lost approximately 30 pounds prior to his evaluaon  in January 2019.  In February 2019 he apparently had fallen and hit his head.  He had torn his  rotator cuff.   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  He is followed by [facility] for his renal insufficiency.  He is not been using his CPAP since his hospitalization.  Laboratory during this evaluation had shown a creatinine of 1.69.  An echo Doppler study on Apr 01, 2018 showed an EF of 30% there is mitral annular calcification with moderate mitral regurgitation.  Left atrium was moderately dilated.  Presently, he is unaware of any recurrent atrial fibrillation and continues to be on sotalol 120 mg twice a day in addition to warfarin for anticoagulation.  His blood pressure has been low and he has experienced episodes of dizziness.  Since May his legs and feet have been swollen.  He is diabetic on for Farxiga, insulin, and metformin.  He has been on rosuvastatin for hyperlipidemia.   When I saw him on Apr 14, 2018 furosemide was reduced to 20 mg due to his renal insufficiency and he was on losartan 25 mg daily he recently saw Dr. Jimmy Footman.  His renal function has improved to 1.90-monthago.  His BNP was 295.  Dr. Detterding recently increase his furosemide to 40 mg.  He underwent an echo Doppler study on Apr 01, 2018.  Ejection fraction was reduced at 30% and reportedly left ventricular diastolic parameters were normal.   When I saw him on June 07, 2018 he denied any chest pain, PND orthopnea or recent swelling.  I discussed with him data regarding improved survival and reduction CHF and recommended discontinuance of losartan and initiated low-dose Entresto at 24/26 mg twice a day.  I recommended he decrease furosemide down to 20 mg from his dose of 40 mg.  He apparently had called the office stating he felt weak with the Entresto.  He complained of some mild blurred vision.  He called the office after stopping Entresto and his blood pressures typically were running from 124 up to 119  systolically.    In addition to his heart issues, he has had difficulty with his CPAP machine.  His CPAP machine is making noises was malfunctioning.  Red light has been on.  At times it is intermittently stopped working.  In the office we obtained a download and he has been on  AutoCPAP 11 to 20 cm of water pressure CPAP unit and is meeting compliance averaging 8 hours and 6 minutes per night.  His 30-day 90 percentile pressure is 15.6 with an AHI of 3.0.  When I  saw him I recommended that he get a new CPAP machine.  CPAP set up date was July 12, 2018 he received a ResMed air sense 10 AutoSet unit.  He was originally set at a minimum pressure of 13 and maximum pressure of 20.  A new download was obtained in the office today from January 13 through January 02, 2019.  AHI is 6.8.  His 95th percentile pressure is 18.2 with a maximum average pressure of 19.  There is mild mask leak.  He was told by pharmacist that he must take sotalol and alfuzosin 6 hours apart.  When I last saw him in February 2020 he had self reduced his Delene Loll and was only  taking one half of a 24/26 mg twice daily regimen.  He denied associated hypotension but had experience of episodes of a dizziness leading to his dose reduction.  He is followed by Dr. [name] for his pacemaker.  During my evaluation, I attempted to rechallenge him with reinstitution of Entresto 24/26 twice daily but recommended discontinuance of furosemide for blood pressure stability.  He was using CPAP with 100% compliance but AHI remained elevated.  I adjusted CPAP settings and increase his minimum pressure to 16 with a maximum up to 20 from a previous minimum of 13.  I saw him on October 08, 2019.  At the time he was feeling well and had undergone implantation/upgrade of a dual-chamber PPM to CRT-P on August 14, 2019 by Dr. [name].  He admits to continued CPAP use and a new download was obtained from October 17 to October 07, 2019.  He is 100% compliant.   His CPAP has been set at a range of 16 -20 cm and his 95th percentile pressure was 19.4 with him average pressure of 19.8.  AHI was improved at 3.9.  He tells me that 2 weeks ago he had fallen and hit his head on the floor.  He denied any loss of consciousness.  He has admitted to some mild leg edema.    I last saw him in May 2022 and since his prior evaluation 19 months previously he admitted to development of shortness of breath over the past several months.  He was evaluated in February 2022 by Barrington Ellison, PA-C who was seen for Dr. [name].  At his May office visit, he brought with him a blood pressure records and most  of the time his blood pressure seems to be in the 469-629 range systolically but there have been occurrences where his systolic blood pressure was as high as 166 and is low was 97.  He denies any anginal symptoms.  He continues to use CPAP with 100% compliance.  His AutoSet is at a pressure range of 6 to 20 cm.  95th percentile pressure is 17.8 with a maximum average of 18.8.  AHI was increased at 13.2, but I suspect this is contributed by his significant mask leak on a daily basis.  He was on Entresto 49/51 mg twice a day, carvedilol 6.25 mg twice a day, Farxiga at a reduced dose of 2.5 mg twice a day and has a prescription for furosemide to take on an as-needed basis.  He was experiencing some palpitations and I recommended slight titration of carvedilol to 9.375 mg twice a day.  His LDL cholesterol had increased to 121 and since he did not tolerate statin I added Zetia 10 mg.  Over the past several months, he has felt improved.  He is not having any palpitations or lightheadedness.  He underwent an echo Doppler study on May 07, 2021 on [place] which now reveals improved LV function with EF at 55 to 60%.  There were no regional wall motion abnormalities.  There was grade 1 diastolic dysfunction.  There was mild dilation of his aortic root at 40 mm with mild dilation of the ascending  aorta at 45 mm.  He continues to use CPAP.  I obtained a new download from July 27 through July 16, 2021.  Compliance is excellent with average use at 10 hours and 5 minutes per night.  His most recent CPAP is set at a range of 16 to 20 cm.  AHI is increased at 9.9 and his 95 percentile average is 19.7 with maximum average 19.8 cm of water.  There is mild mask leak.  He feels well and presents for evaluation.   Past Medical History:  Diagnosis Date   Acute cystitis with hematuria 12/23/2016   Allergy    Arthritis    "knees, hands, lower back" (07/29/2016)   Asthma    "touch q once & awhile" (02/05/2016)   Cardiomyopathy, ischemic    Carpal tunnel syndrome    Cataract    left eye   CHF (congestive heart failure) ([place]) 03/2018   chronic mixed   Chronic bronchitis (HCC)    Chronic kidney disease (CKD), stage III (moderate) (HCC)    Chronic venous insufficiency    with prior venous stasis ulcers x 1 [phone]   Complication of anesthesia    "when coming out, I choke and get very restless if breathing tube is still in"   Coronary artery disease    a. history of multiple stents to the LCx, LAD, and RCA b. s/p CABG in 08/2016 with LIMA-LAD, SVG-OM, SVG-PDA, and SVG-D1   Diabetes mellitus, type II ([place])    type 2   Elevated creatine kinase level 2018   Exogenous obesity    severe   Hearing loss    Left ear   Helicobacter pylori gastritis 2016   History of blood transfusion ~ 2015   related to "when they went in to get my kidney stones"   History of kidney stones    Hx of colonic polyps 09/2006   inflammatory polyp at hepatic flexure. not adenomatous or malignant.    Hyperlipidemia    Hypertension    Iron deficiency anemia  Left bundle branch block (LBBB)    Nephrolithiasis    sees [facility], sees every 4 months dr. Jimmy Footman ckd stage 3   OSA on CPAP    "nasal CPAP" (07/29/2016) patient does not know settings    Osteoarthritis, knee    PAF (paroxysmal atrial fibrillation) ([place])     a. on Xarelto   Pneumonia 03/2018   Rotator cuff tear last 2 years   right    Sinus headache    occ   SSS (sick sinus syndrome) (HCC)    Statin intolerance    Hx of. Now tolerating Zetia & Livalo well.     Past Surgical History:  Procedure Laterality Date   APPENDECTOMY  1962   BIV PACEMAKER INSERTION CRT-P N/A 08/14/2019   upgrade to CRT-P [facility] Jude) for CHF   CARDIAC CATHETERIZATION     "a couple times they didn't do any stents" (07/29/2016)   CARDIAC CATHETERIZATION N/A 07/29/2016   Procedure: Left Heart Cath and Coronary Angiography;  Surgeon: Jettie Booze, MD;  Location: [facility];  Service: Cardiovascular;  Laterality: N/A;   CARDIAC CATHETERIZATION N/A 07/29/2016   Procedure: Coronary Balloon Angioplasty;  Surgeon: Jettie Booze, MD;  Location: [facility];  Service: Cardiovascular;  Laterality: N/A;   CARDIAC CATHETERIZATION N/A 09/08/2016   Procedure: Left Heart Cath and Coronary Angiography;  Surgeon: Belva Crome, MD;  Location: [facility];  Service: Cardiovascular;  Laterality: N/A;   CARDIAC CATHETERIZATION N/A 09/08/2016   Procedure: Intravascular Pressure Wire/FFR Study;  Surgeon: Belva Crome, MD;  Location: [facility];  Service: Cardiovascular;  Laterality: N/A;   CARPAL TUNNEL RELEASE Left 07/26/2018   Procedure: CARPAL TUNNEL RELEASE;  Surgeon: Latanya Maudlin, MD;  [facility]: [facility];  Service: Orthopedics;  Laterality: Left;   CARPAL TUNNEL RELEASE Right 09/13/2018   Procedure: CARPAL TUNNEL RELEASE;  Surgeon: Latanya Maudlin, MD;  [facility];  Service: Orthopedics;  Laterality: Right;  6mn   CHOLECYSTECTOMY  02/09/2016   Procedure: LAPAROSCOPIC CHOLECYSTECTOMY;  Surgeon: DCoralie Keens MD;  Location: [facility]  Service: General;;   COLONOSCOPY     CORONARY ANGIOPLASTY  07/28/2016   CORONARY ANGIOPLASTY WITH STENT PLACEMENT  1998 & 2008   Last cath in 2008, remote LAD stenting: Cx/OM bifurcation, proximal right  coronary.    CORONARY ANGIOPLASTY WITH STENT PLACEMENT     "I think I have 7 stents" (07/29/2016)   CORONARY ARTERY BYPASS GRAFT N/A 09/15/2016   Procedure: CORONARY ARTERY BYPASS GRAFTING (CABG) x four, using left internal mammary artery and right leg greater saphenous vein harvested endscopically;  Surgeon: EGrace Isaac MD;  Location: [facility]  Service: Open Heart Surgery;  Laterality: N/A;   CYSTOSCOPY W/ URETERAL STENT PLACEMENT Left 06/16/2009; 06/26/2009   Left proximal ureteral stone/notes 03/23/2011   CYSTOSCOPY W/ URETERAL STENT PLACEMENT Right 01/01/2017   Procedure: CYSTOSCOPY WITH RETROGRADE PYELOGRAM/ RIGHT URETERAL STENT PLACEMENT;  Surgeon: SFranchot Gallo MD;  [facility];  Service: Urology;  Laterality: Right;   ESOPHAGOGASTRODUODENOSCOPY  09/2015   w/biopsy   EUS N/A 02/06/2016   Procedure: UPPER ENDOSCOPIC ULTRASOUND (EUS) RADIAL;  Surgeon: DMilus Banister MD;  Location: [facility]  Service: Endoscopy;  Laterality: N/A;   INSERT / REPLACE / REMOVE PACEMAKER  08/2016   [facility] Zephyr XL DR 5826, dual chamber, rate responsive. No arrhythmias recorded and he has an excellent threshold.   KNEE ARTHROSCOPY Bilateral    "twice on the  right from [place]"   [place]   TEE WITHOUT CARDIOVERSION N/A 09/15/2016   Procedure: TRANSESOPHAGEAL ECHOCARDIOGRAM (TEE);  Surgeon: Grace Isaac, MD;  Location: [facility];  Service: Open Heart Surgery;  Laterality: N/A;   UMBILICAL HERNIA REPAIR  01/2016   "when I had my gallbladder removed"   UPPER GASTROINTESTINAL ENDOSCOPY     URETEROSCOPY WITH HOLMIUM LASER LITHOTRIPSY Right 01/06/2017   Procedure: RIGHT URETEROSCOPY STONE EXTRACTION WITH HOLMIUM LASER and STENT REMOVAL ;  Surgeon: Irine Seal, MD;  [facility];  Service: Urology;  Laterality: Right;    Current Medications: Outpatient Medications Prior to Visit  Medication Sig Dispense Refill   alfuzosin (UROXATRAL) 10 MG  24 hr tablet Take 10 mg by mouth every evening.      aspirin EC 81 MG EC tablet Take 1 tablet (81 mg total) by mouth daily.     BD INSULIN SYRINGE U/F 30G X 1/2" 0.5 ML MISC USE TO INJECT INSULIN 3 TIMES DAILY 300 each 3   carvedilol (COREG) 6.25 MG tablet Take 1 tablet (6.25 mg total) by mouth 2 (two) times daily. 180 tablet 1   cetirizine (ZYRTEC) 10 MG tablet Take 10 mg by mouth daily as needed for allergies.     Coenzyme Q10 (COQ-10) 400 MG CAPS Take 400 mg by mouth daily.     Continuous Blood Gluc Receiver (FREESTYLE LIBRE 14 DAY READER) DEVI USE TO MONITOR BLOOD SUGAR 1 each 1   Continuous Blood Gluc Sensor (FREESTYLE LIBRE 14 DAY SENSOR) MISC APPLY 1 SENSOR EVERY 14 DAYS AS DIRECTED 2 each 5   cyanocobalamin 1000 MCG tablet Take 1,000 mcg by mouth daily.     dapagliflozin propanediol (FARXIGA) 5 MG TABS tablet Take by mouth daily. Take 1/2 tablet by mouth in the morning and 1/2 tablet in the evening     diclofenac sodium (VOLTAREN) 1 % GEL Apply 1 application topically as needed (pain).     furosemide (LASIX) 40 MG tablet Take 0.5 tablets (20 mg total) by mouth as needed. 30 tablet 3   gabapentin (NEURONTIN) 300 MG capsule TAKE 1 CAPSULE BY MOUTH THREE TIMES A DAY 270 capsule 2   glucose blood (FREESTYLE PRECISION NEO TEST) test strip Use Freestyle neo test strips as instructed to check blood sugar with libre meter up to three times daily as needed. 150 strip 2   glucose blood test strip Check sugar  3x daily 100 each 12   insulin degludec (TRESIBA FLEXTOUCH) 100 UNIT/ML FlexTouch Pen Inject 44 Units into the skin daily.     Insulin Pen Needle (BD ULTRA-FINE PEN NEEDLES) 29G X 12.7MM MISC USE DAILY TO INJECT TRESIBA INSULIN 100 each 3   metFORMIN (GLUCOPHAGE-XR) 500 MG 24 hr tablet Take 500 mg tablet by mouth for two weeks     metroNIDAZOLE (FLAGYL) 250 MG tablet Take 1 tablet (250 mg total) by mouth 3 (three) times daily. 30 tablet 0   nitroGLYCERIN (NITROSTAT) 0.4 MG SL tablet Place 1  tablet (0.4 mg total) under the tongue every 5 (five) minutes as needed for chest pain. 25 tablet 2   NOVOLOG 100 UNIT/ML injection INJECT 15-30 UNITS UNDER THE SKIN THREE TIMES DAILY BEFORE MEALS. 30 mL 11   oxymetazoline (AFRIN) 0.05 % nasal spray Place 2 sprays into both nostrils 2 (two) times daily.     promethazine (PHENERGAN) 25 MG tablet Take 1 tablet by mouth as needed for nausea.  sacubitril-valsartan (ENTRESTO) 24-26 MG Take 1 tablet by mouth 2 (two) times daily. 180 tablet 0   VENTOLIN HFA 108 (90 Base) MCG/ACT inhaler Inhale 2 puffs into the lungs 2 (two) times daily as needed for wheezing or shortness of breath.  0   XARELTO 20 MG TABS tablet TAKE 1 TABLET BY MOUTH DAILY WITH SUPPER 30 tablet 5   No facility-administered medications prior to visit.     Allergies:   Black pepper [piper], Chocolate, Codeine, Oxytetracycline, Statins, Latex, and Tape   Social History   Socioeconomic History   Marital status: Married    Spouse name: Not on file   Number of children: 1   Years of education: Not on file   Highest education level: Not on file  Occupational History   Occupation: Naval architect    Comment[place] [employer]  Tobacco Use   Smoking status: Former    Packs/day: 1.00    Years: 5.00    Pack years: 5.00    Types: Cigarettes    Quit date: 11/22/1974    Years since quitting: 46.6   Smokeless tobacco: Never  Vaping Use   Vaping Use: Never used  Substance and Sexual Activity   Alcohol use: No    Alcohol/week: 0.0 standard drinks   Drug use: Never   Sexual activity: Not Currently  Other Topics Concern   Not on file  Social History Narrative   Married lives with wife 1 grown child   Retired  Development worker, 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  He is one child.  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  Family History:  The patient's family history includes Arthritis in his sister; Epilepsy in his brother; Heart attack (age of onset: 34) in his mother; Lung cancer in his maternal grandfather; Neuropathy in his brother; Stroke in his maternal grandmother and paternal grandfather.   ROS General: Negative; No fevers, chills, or night sweats;  HEENT: Negative; No changes in vision or hearing, sinus congestion, difficulty swallowing Pulmonary: Negative; No cough, wheezing, shortness of breath, hemoptysis Cardiovascular: See history of present illness GI: Negative; No nausea, vomiting, diarrhea, or abdominal pain GU: Negative; No dysuria, hematuria, or difficulty voiding Musculoskeletal: Right knee discomfort, left carpal tunnel. Hematologic/Oncology: Negative; no easy bruising, bleeding Endocrine: Positive for diabetes mellitus. Neuro: Negative; no changes in balance, headaches Skin: Negative; No rashes or skin lesions Psychiatric: Negative; No behavioral problems, depression Sleep: Positive for obstructive sleep apnea on CPAP; nobruxism, restless legs, hypnogognic hallucinations, no cataplexy Other comprehensive 14 point system review is negative.   PHYSICAL EXAM:   VS:  BP 118/70 (BP Location: Right Arm)   Pulse 70   Ht 5' 9"  (1.753 m)   Wt 295 lb 3.2 oz (133.9 kg)   SpO2 97%   BMI 43.59 kg/m     Repeat blood pressure by me was 122/70  Wt Readings from Last 3 Encounters:  07/17/21 295 lb 3.2 oz (133.9 kg)  06/01/21 295 lb 3.2 oz (133.9 kg)  05/28/21 291  lb (132 kg)    General: Alert, oriented, no distress.  Skin: normal turgor, no rashes, warm and dry HEENT: Normocephalic, atraumatic. Pupils equal round and reactive to light; sclera  anicteric; extraocular muscles intact;  Nose without nasal septal hypertrophy Mouth/Parynx benign; Mallinpatti scale 3 Neck: No JVD, no carotid bruits; normal carotid upstroke Lungs: clear to ausculatation and percussion; no wheezing or rales Chest wall: without tenderness to palpitation Heart: PMI not displaced, RRR, s1 s2 normal, 1/6 systolic murmur, no diastolic murmur, no rubs, gallops, thrills, or heaves Abdomen: soft, nontender; no hepatosplenomehaly, BS+; abdominal aorta nontender and not dilated by palpation. Back: no CVA tenderness Pulses 2+ Musculoskeletal: full range of motion, normal strength, no joint deformities Extremities: no clubbing cyanosis or edema, Homan's sign negative  Neurologic: grossly nonfocal; Cranial nerves grossly wnl Psychologic: Normal mood and affect   Studies/Labs Reviewed:   Apr 06, 2021 ECG (independently read by me): AV paced at 70; PR 214 msec    November 2020 ECG (independently read by me): AV paced rhythm at 70 bpm with prolonged AV conduction.  PR interval 224 ms.  January 04, 2019 ECG (independently read by me): Atrially paced rhythm at 65 bpm with prolonged AV conduction with a peroneal a 254 ms.  Left bundle branch block.  June 26, 2018 ECG (independently read by me):  Atrial paced at 71, prolonged AV conduction PR 250 msec; [facility]  June 07, 2018 ECG (independently read by me): Atrially paced rhythm at 70 bpm with prolonged AV conduction with PR interval at 238.  Left bundle branch block.  May 2019 ECG (independently read by me): Atrially paced rhythm at 76 bpm with prolonged AV conduction with a PR interval 236 ms.  Left bundle branch block.  January 2019 ECG (independently read by me): Atrial paced rhythm at 70 bpm.  Prolonged AV conduction with a PR interval 248.  Left bundle branch block.  QTc interval 494 ms.  April 2018 ECG (independently read by me): Atrially paced rhythm at 74 bpm, prolonged A-V conduction with a PR 246.    ECG  (independently read by me): Atrial paced rhythm at 76 bpm alternating with atrial sensing in a bigeminal pattern.  There is left bundle branch block.  Recent Labs: BMP Latest Ref Rng & Units 03/13/2021 01/08/2021 08/12/2020  Glucose 65 - 99 mg/dL - 227(H) 62(L)  BUN 4 - 21 21 21 21   Creatinine 0.6 - 1.3 1.4(A) 1.20 1.33  BUN/Creat Ratio 10 - 24 - 18 -  Sodium 134 - 144 mmol/L - 139 139  Potassium 3.5 - 5.2 mmol/L - 4.7 4.1  Chloride 96 - 106 mmol/L - 104 106  CO2 20 - 29 mmol/L - 16(L) 24  Calcium 8.6 - 10.2 mg/dL - 8.9 8.9     Hepatic Function Latest Ref Rng & Units 08/12/2020 08/04/2020 05/08/2020  Total Protein 6.0 - 8.3 g/dL 7.0 6.8 6.9  Albumin 3.5 - 5.2 g/dL 4.0 4.2 4.0  AST 0 - 37 U/L 11 10 11   ALT 0 - 53 U/L 13 11 14   Alk Phosphatase 39 - 117 U/L 54 66 43  Total Bilirubin 0.2 - 1.2 mg/dL 0.5 0.3 0.4  Bilirubin, Direct 0.1 - 0.5 mg/dL - - -    CBC Latest Ref Rng & Units 01/08/2021 08/04/2020 08/12/2019  WBC 3.4 - 10.8 x10E3/uL 5.8 6.2 7.5  Hemoglobin 13.0 - 17.7 g/dL 14.7 14.9 14.5  Hematocrit 37.5 - 51.0 % 44.4 44.8 44.3  Platelets 150 - 450 x10E3/uL  205 197 188   Lab Results  Component Value Date   MCV 92 01/08/2021   MCV 93 08/04/2020   MCV 94.7 08/12/2019   Lab Results  Component Value Date   TSH 1.340 08/04/2020   Lab Results  Component Value Date   HGBA1C 6.9 05/20/2021     BNP    Component Value Date/Time   BNP 295.5 (H) 04/14/2018 1413   BNP 203.4 (H) 03/31/2018 1110   BNP 77.0 07/03/2013 1604    ProBNP    Component Value Date/Time   PROBNP 739 (H) 08/07/2018 0912   PROBNP 364.0 (H) 10/21/2015 0932     Lipid Panel     Component Value Date/Time   CHOL 165 05/20/2021 0000   TRIG 217 (A) 05/20/2021 0000   HDL 42 05/20/2021 0000   CHOLHDL 2 10/11/2019 0932   VLDL 17.8 10/11/2019 0932   LDLCALC 86 05/20/2021 0000     RADIOLOGY: No results found.   Additional studies/ records that were reviewed today include:  I reviewed the patient's  hospitalizations, office visits evaluations, notes from Dr. Servando Snare, laboratory, and  catheterization data, I reviewed his echo Doppler study as well as his recent evaluation from [facility]. Recent hospitalization for ICD implant. A new download was obtained of his CPAP unit from October 17 through October 07, 2019.  EP records were reviewed.  Download was obtained from April 14 through Apr 03, 2021 which shows excellent compliance with average use at 9 hours and 16 minutes.  There is significant mask leak.  AutoSet pressure range was from 16 to 20 cm.  AHI 13.2.  ASSESSMENT:    1. CAD in native artery: Status post multiple interventions   2. S/P CABG x 4   3. OSA on CPAP   4. PAF (paroxysmal atrial fibrillation) (HCC)   5. Cardiomyopathy, ischemic   6. Hyperlipidemia with target LDL less than 71   7. Type 2 diabetes mellitus with other circulatory complication, with long-term current use of insulin ([place])   8. Morbid obesity ([place])      PLAN:  Mr. Rushlow is a 75 year old gentleman who had previously undergone multiple coronary interventions since 1995 for significant coronary obstructive disease.  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 Servando Snare on 09/15/2016.  He has a history of PAF and continues to maintain sinus rhythm with atrial pacing.  He is on Xarelto for anticoagulation.   His pacemaker is followed by Dr. [name] and in September 2020 had implantation/upgrade of a dual chamber PPM to CRT-P on 08/14/2019 by Dr [name]. and received a [employer]ist model [id number] (serial number [id number]) lead, [place] MP model [id number] (serial  Number [id number]) biventricular pacemaker.  His most echo on August 13, 2019 revealed an EF of 35 to 40% with mild LVH grade 1 diastolic dysfunction, and moderate mitral regurgitation.  Most recently, he is now on Entresto 24/26 mg twice a day, Farxiga 2.5 mg twice a day,  carvedilol 6.25 mg twice a day, and as needed furosemide.  I reviewed his most recent echo study from May 07, 2021 which now shows normalization of LV function with EF at 55 to 60%.  There were no wall motion abnormalities.  There was grade 1 diastolic dysfunction.  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  Clinically he feels well.  Blood pressure today is well  controlled and on repeat by me 122/70.  I reviewed his most recent CPAP data.  His compliance is excellent and average sleep is over 10 hours per night.  His 95th percentile pressure is 19.7.  As result I will change his CPAP setting to a fixed set pressure at 20 cm.  We again discussed proper mask cleaning to avoid leak.  BMI is 43.59 consistent with morbid obesity.  Weight loss and improved exercise was recommended.  He is diabetic on metformin in addition to insulin followed by Dr. Dwyane Dee.  Laboratory by Cyndi Bender in June 2022 showed LDL cholesterol at 86.  He did not tolerate statin.  I had recommended initiation of Zetia but did not see this on his med list.  He may be a candidate for PCSK9 inhibition if unable to tolerate.  He is on gabapentin for neuropathy.  He will continue current therapy.  I will see him in 6 months for reevaluation or sooner as needed.   Medication Adjustments/Labs and Tests Ordered: Current medicines are reviewed at length with the patient today.  Concerns regarding medicines are outlined above.  Medication changes, Labs and Tests ordered today are listed in the Patient Instructions below. Patient Instructions  Medication Instructions:   No changes  Other instructions  pressure changes were done to your C-PAP    Lab Work: Not needed   Testing/Procedures: Not needed   Follow-Up: At [facility], you and your health needs are our priority.  As part of our continuing mission to provide you with exceptional heart care, we have created designated  Provider Care Teams.  These Care Teams include your primary Cardiologist (physician) and Advanced Practice Providers (APPs -  Physician Assistants and Nurse Practitioners) who all work together to provide you with the care you need, when you need it.  We recommend signing up for the patient portal called "MyChart".  Sign up information is provided on this After Visit Summary.  MyChart is used to connect with patients for Virtual Visits (Telemedicine).  Patients are able to view lab/test results, encounter notes, upcoming appointments, etc.  Non-urgent messages can be sent to your provider as well.   To learn more about what you can do with MyChart, go to [email or web address].    Your next appointment:   6 month(s)  The format for your next appointment:   In Person  Provider:   Shelva Majestic, MD       Signed, Shelva Majestic, MD  07/18/2021 4:57 PM    [facility] [address], [place], [place], [place]  [address] Phone: [phone]

## 2021-07-17 NOTE — Patient Instructions (Signed)
Medication Instructions:   No changes  Other instructions  pressure changes were done to your C-PAP    Lab Work: Not needed   Testing/Procedures: Not needed   Follow-Up: At [facility], you and your health needs are our priority.  As part of our continuing mission to provide you with exceptional heart care, we have created designated Provider Care Teams.  These Care Teams include your primary Cardiologist (physician) and Advanced Practice Providers (APPs -  Physician Assistants and Nurse Practitioners) who all work together to provide you with the care you need, when you need it.  We recommend signing up for the patient portal called "MyChart".  Sign up information is provided on this After Visit Summary.  MyChart is used to connect with patients for Virtual Visits (Telemedicine).  Patients are able to view lab/test results, encounter notes, upcoming appointments, etc.  Non-urgent messages can be sent to your provider as well.   To learn more about what you can do with MyChart, go to [email or web address].    Your next appointment:   6 month(s)  The format for your next appointment:   In Person  Provider:   Shelva Majestic, MD

## 2021-07-18 ENCOUNTER — Encounter: Payer: Self-pay | Admitting: Cardiovascular Disease

## 2021-07-24 ENCOUNTER — Other Ambulatory Visit: Payer: Self-pay | Admitting: Endocrinology

## 2021-07-24 ENCOUNTER — Other Ambulatory Visit: Payer: Self-pay | Admitting: Internal Medicine

## 2021-07-24 NOTE — Telephone Encounter (Signed)
Prescription refill request for Xarelto received.   Indication: afib  Last office visit: kelly, 07/17/2021 Weight: 133.9 kg  Age: 75 yo  Scr: 1.17, 05/20/2021 CrCl: 104.91 ml/min   Refill sent.

## 2021-08-03 ENCOUNTER — Ambulatory Visit (INDEPENDENT_AMBULATORY_CARE_PROVIDER_SITE_OTHER): Payer: Medicare Other

## 2021-08-03 DIAGNOSIS — I5022 Chronic systolic (congestive) heart failure: Secondary | ICD-10-CM | POA: Diagnosis not present

## 2021-08-03 DIAGNOSIS — Z950 Presence of cardiac pacemaker: Secondary | ICD-10-CM

## 2021-08-04 NOTE — Progress Notes (Signed)
EPIC Encounter for ICM Monitoring  Patient Name: Charles [name] is a 75 y.o. male Date: 08/04/2021 Primary Care Physican: Cyndi Bender, PA-C Primary Cardiologist: Claiborne Billings Electrophysiologist: [name] Bi-V Pacing:  96%         07/17/2021 Office Weight: 295 lbs    AT/AF Burden: 0%                                                          Spoke with patient and heart failure questions reviewed.  Pt asymptomatic for fluid accumulation and feeling well.  He has taken a couple of PRN Furosemide in the last week.    CorVue thoracic impedance normal but was suggesting possible fluid accumulation 8/7-8/19   Prescribed: Furosemide 40 mg take 0.5 tablet (20 mg total) as needed Xarelto 20 mg take 1 tablet at supper daily   Recommendations   No changes and encouraged to call if experiencing any fluid symptoms.   Follow-up plan: ICM clinic phone appointment on 09/07/2021.   91 day device clinic remote transmission 08/17/2021.   EP/Cardiology Office Visits:  Recall 07/07/2021 with Dr [name].  01/12/2022 with Dr Claiborne Billings.   Copy of ICM check sent to Dr. [name].   3 month ICM trend: 08/03/2021.    1 Year ICM trend:       Rosalene Billings, RN 08/04/2021 3:04 PM

## 2021-08-17 ENCOUNTER — Ambulatory Visit (INDEPENDENT_AMBULATORY_CARE_PROVIDER_SITE_OTHER): Payer: Medicare Other

## 2021-08-17 DIAGNOSIS — I255 Ischemic cardiomyopathy: Secondary | ICD-10-CM | POA: Diagnosis not present

## 2021-08-17 LAB — CUP PACEART REMOTE DEVICE CHECK
Battery Remaining Longevity: 68 mo
Battery Remaining Percentage: 74 %
Battery Voltage: 2.99 V
Brady Statistic AP VP Percent: 92 %
Brady Statistic AP VS Percent: 1 %
Brady Statistic AS VP Percent: 3.9 %
Brady Statistic AS VS Percent: 1 %
Brady Statistic RA Percent Paced: 87 %
Date Time Interrogation Session: 20220926020014
Implantable Lead Implant Date: 20091007
Implantable Lead Implant Date: 20091007
Implantable Lead Implant Date: 20200922
Implantable Lead Location: 753858
Implantable Lead Location: 753859
Implantable Lead Location: 753860
Implantable Pulse Generator Implant Date: 20200922
Lead Channel Impedance Value: 1300 Ohm
Lead Channel Impedance Value: 440 Ohm
Lead Channel Impedance Value: 640 Ohm
Lead Channel Pacing Threshold Amplitude: 0.75 V
Lead Channel Pacing Threshold Amplitude: 0.75 V
Lead Channel Pacing Threshold Amplitude: 1.25 V
Lead Channel Pacing Threshold Pulse Width: 0.4 ms
Lead Channel Pacing Threshold Pulse Width: 0.4 ms
Lead Channel Pacing Threshold Pulse Width: 0.5 ms
Lead Channel Sensing Intrinsic Amplitude: 1.9 mV
Lead Channel Sensing Intrinsic Amplitude: 12 mV
Lead Channel Setting Pacing Amplitude: 2 V
Lead Channel Setting Pacing Amplitude: 2.25 V
Lead Channel Setting Pacing Amplitude: 2.5 V
Lead Channel Setting Pacing Pulse Width: 0.4 ms
Lead Channel Setting Pacing Pulse Width: 0.5 ms
Lead Channel Setting Sensing Sensitivity: 2 mV
Pulse Gen Model: 3562
Pulse Gen Serial Number: 9157656

## 2021-08-24 NOTE — Progress Notes (Signed)
Remote pacemaker transmission.   

## 2021-08-27 ENCOUNTER — Encounter: Payer: Self-pay | Admitting: Endocrinology

## 2021-09-03 ENCOUNTER — Other Ambulatory Visit: Payer: Self-pay

## 2021-09-03 ENCOUNTER — Encounter: Payer: Self-pay | Admitting: Endocrinology

## 2021-09-03 ENCOUNTER — Ambulatory Visit (INDEPENDENT_AMBULATORY_CARE_PROVIDER_SITE_OTHER): Payer: Medicare Other | Admitting: Endocrinology

## 2021-09-03 VITALS — BP 100/64 | HR 58 | Ht 69.0 in | Wt 294.8 lb

## 2021-09-03 DIAGNOSIS — Z794 Long term (current) use of insulin: Secondary | ICD-10-CM

## 2021-09-03 DIAGNOSIS — E1165 Type 2 diabetes mellitus with hyperglycemia: Secondary | ICD-10-CM | POA: Diagnosis not present

## 2021-09-03 DIAGNOSIS — I255 Ischemic cardiomyopathy: Secondary | ICD-10-CM | POA: Diagnosis not present

## 2021-09-03 DIAGNOSIS — N289 Disorder of kidney and ureter, unspecified: Secondary | ICD-10-CM

## 2021-09-03 LAB — POCT GLYCOSYLATED HEMOGLOBIN (HGB A1C): Hemoglobin A1C: 6.2 % — AB (ref 4.0–5.6)

## 2021-09-03 LAB — POCT GLUCOSE (DEVICE FOR HOME USE): POC Glucose: 77 mg/dl (ref 70–99)

## 2021-09-03 MED ORDER — FREESTYLE LIBRE 2 SENSOR MISC: 2.0000 | 3 refills | Status: DC
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
## 2021-09-03 NOTE — Patient Instructions (Addendum)
No Novolog at bedtime  Novolog 15 with eggs/toast  Take Novolog 15-20 based on meal size and Carbs  Tresiba 40 units

## 2021-09-07 ENCOUNTER — Ambulatory Visit (INDEPENDENT_AMBULATORY_CARE_PROVIDER_SITE_OTHER): Payer: Medicare Other

## 2021-09-07 DIAGNOSIS — Z950 Presence of cardiac pacemaker: Secondary | ICD-10-CM | POA: Diagnosis not present

## 2021-09-07 DIAGNOSIS — I5022 Chronic systolic (congestive) heart failure: Secondary | ICD-10-CM

## 2021-09-09 NOTE — Progress Notes (Signed)
EPIC Encounter for ICM Monitoring  Patient Name: Charles [name] is a 75 y.o. male Date: 09/09/2021 Primary Care Physican: Cyndi Bender, PA-C Primary Cardiologist: Claiborne Billings Electrophysiologist: [name] Bi-V Pacing:  95%         07/17/2021 Office Weight: 295 lbs    AT/AF Burden: 0%                                                          Spoke with patient and heart failure questions reviewed.  Pt asymptomatic for fluid accumulation and feeling well.     CorVue thoracic impedance normal.   Prescribed: Furosemide 40 mg take 0.5 tablet (20 mg total) as needed Xarelto 20 mg take 1 tablet at supper daily   Recommendations   No changes and encouraged to call if experiencing any fluid symptoms.   Follow-up plan: ICM clinic phone appointment on 10/12/2021.   91 day device clinic remote transmission 11/17/2021.   EP/Cardiology Office Visits:  Recall 07/07/2021 with Dr [name].  01/12/2022 with Dr Claiborne Billings.   Copy of ICM check sent to Dr. [name].   3 month ICM trend: 09/07/2021.    1 Year ICM trend:       Rosalene Billings, RN 09/09/2021 1:09 PM

## 2021-09-15 ENCOUNTER — Telehealth: Payer: Self-pay | Admitting: Cardiovascular Disease

## 2021-09-15 NOTE — Telephone Encounter (Signed)
Patient would like for someone to give him a call back with his last cholesterol readings.

## 2021-09-15 NOTE — Telephone Encounter (Signed)
Spoke with patient about last labs, done by Dr. Dwyane Dee in June. Reviewed results, explained LDL should be less than 70 (was better controlled in the past) but he states he cannot tolerate statins. Reviewed other options - PCSK9i (likely cost prohibitive) and Leqvio (patient has optimal plan for this - [employer] supplement plan F). He said he will think about things, review with Dr. Claiborne Billings at visit in February

## 2021-09-16 ENCOUNTER — Other Ambulatory Visit: Payer: Self-pay | Admitting: Endocrinology

## 2021-09-24 ENCOUNTER — Telehealth: Payer: Self-pay | Admitting: Cardiovascular Disease

## 2021-09-24 ENCOUNTER — Telehealth: Payer: Self-pay

## 2021-09-24 NOTE — Telephone Encounter (Signed)
After speaking with Dr. Claiborne Billings about pt's symptoms. Called pt, he states I only take the Lasix as needed and I don't really take it. I'm not dizzy anymore but I do still feel the palpations. Will relay message to Dr. Claiborne Billings.

## 2021-09-24 NOTE — Telephone Encounter (Signed)
STAT if patient feels like he/she is going to faint   Are you dizzy now? no  Do you feel faint or have you passed out? no  Do you have any other symptoms? Sob, lightheaded, feels his heart beating   Have you checked your HR and BP (record if available)? 10:43 am 127/65 70 bpm, 10:49 am 138/80 hr 74, 10:53 am 141/72, 11:03 163/44 hr 75  Pt c/o Shortness Of Breath: STAT if SOB developed within the last 24 hours or pt is noticeably SOB on the phone  1. Are you currently SOB (can you hear that pt is SOB on the phone)? No   2. How long have you been experiencing SOB? The last half hour   3. Are you SOB when sitting or when up moving around? Both   4. Are you currently experiencing any other symptoms? Lightheaded, dizzy, heart racing

## 2021-09-24 NOTE — Telephone Encounter (Signed)
Called pt, BP 113/62 HR 75. Lightheaded, almost passing out, sob all of a sudden this morning. Pt has a pacemaker, "I don't know if it will show up or not." Right at the moment pt feels fine. It all started today.  "I've had this before but last time was working outside today I wasn't doing anything. When he cut my carvedilol down it helped. I know some medications have these side effects." Will call pt back with advise from Dr. Claiborne Billings.

## 2021-09-25 ENCOUNTER — Telehealth: Payer: Self-pay | Admitting: Cardiovascular Disease

## 2021-09-25 NOTE — Telephone Encounter (Signed)
Pt c/o medication issue:  1. Name of Medication: alfuzosin (UROXATRAL) 10 MG 24 hr tablet  carvedilol (COREG) 6.25 MG tablet  dapagliflozin propanediol (FARXIGA) 5 MG TABS tablet  2. How are you currently taking this medication (dosage and times per day)?  Take 10 mg by mouth every evening.   Take 1 tablet (6.25 mg total) by mouth 2 (two) times daily.  Take by mouth daily. Take 1/2 tablet by mouth in the morning and 1/2 tablet in the evening  3. Are you having a reaction (difficulty breathing--STAT)? no  4. What is your medication issue?  Patient called in asking for the pharmacist to tell him the side effects of taking these 3 medication

## 2021-09-25 NOTE — Telephone Encounter (Signed)
Called pt. He states he thinks he may be having side effects from taking some medications at the same time. He was not sure. 11:00 B/P 158/85 HR 75, 11:35 B/P 146/87 HR 71. Pt denies pain, has "a little" shortness of breath, is experiencing palpations. Will route message to PhramD since Dr. Claiborne Billings is out off office.

## 2021-09-28 NOTE — Telephone Encounter (Signed)
Spoke with patient. I did Alfuzosin can increase BP lowering effect of carvedilol, but not typically a big issue.  He states that yesterday his BG was 40 after we ate breakfast and went to the grocery store. He had discomfort of left shoulder blade took 2 nitroglycerin and felt better.  AM BG usually ~100 Takes 20 units novolog with meals and 40 units of long acting insulin  Fasting BG102, 97, 72,   Before lunch: 93,95,126,100,100, 40  Before dinner: 93, 95, 126,100,100,110  Feels poorly with BG <100. I advised that he decrease his novolog to 17 units and follow up with his endocrinologist. He should decrease long acting if fasting starts running <80 again.

## 2021-09-30 ENCOUNTER — Other Ambulatory Visit: Payer: Self-pay | Admitting: Endocrinology

## 2021-09-30 DIAGNOSIS — E1165 Type 2 diabetes mellitus with hyperglycemia: Secondary | ICD-10-CM

## 2021-10-02 ENCOUNTER — Telehealth: Payer: Self-pay | Admitting: Endocrinology

## 2021-10-02 NOTE — Telephone Encounter (Signed)
Try to have a pacer check

## 2021-10-03 ENCOUNTER — Other Ambulatory Visit: Payer: Self-pay | Admitting: Endocrinology

## 2021-10-05 ENCOUNTER — Other Ambulatory Visit: Payer: Self-pay | Admitting: Endocrinology

## 2021-10-07 ENCOUNTER — Other Ambulatory Visit: Payer: Self-pay

## 2021-10-07 DIAGNOSIS — Z794 Long term (current) use of insulin: Secondary | ICD-10-CM

## 2021-10-07 LAB — HM DIABETES FOOT EXAM: HM Diabetic Foot Exam: ABNORMAL

## 2021-10-07 MED ORDER — FREESTYLE LIBRE 14 DAY SENSOR MISC: 3 refills | Status: DC

## 2021-10-08 ENCOUNTER — Other Ambulatory Visit: Payer: Self-pay | Admitting: Endocrinology

## 2021-10-08 ENCOUNTER — Telehealth: Payer: Self-pay | Admitting: Endocrinology

## 2021-10-08 NOTE — Telephone Encounter (Signed)
Patient called to request a new RX for:  New RX for [employer] 14 Day Test Strips  Be sent to:  [id number] - [place], [place] Phone:  [phone]  Fax:  [phone]

## 2021-10-08 NOTE — Telephone Encounter (Signed)
Freestyle Libre 14 day sensors were sent on 10/07/21.

## 2021-10-12 ENCOUNTER — Ambulatory Visit (INDEPENDENT_AMBULATORY_CARE_PROVIDER_SITE_OTHER): Payer: Medicare Other

## 2021-10-12 DIAGNOSIS — I5022 Chronic systolic (congestive) heart failure: Secondary | ICD-10-CM | POA: Diagnosis not present

## 2021-10-12 DIAGNOSIS — Z950 Presence of cardiac pacemaker: Secondary | ICD-10-CM

## 2021-10-12 NOTE — Telephone Encounter (Signed)
Pacer check scheduled for 12/27.  Thanks!

## 2021-10-13 ENCOUNTER — Other Ambulatory Visit: Payer: Self-pay

## 2021-10-13 ENCOUNTER — Telehealth: Payer: Self-pay | Admitting: Endocrinology

## 2021-10-13 ENCOUNTER — Other Ambulatory Visit: Payer: Self-pay | Admitting: Endocrinology

## 2021-10-13 ENCOUNTER — Other Ambulatory Visit: Payer: Self-pay | Admitting: Family

## 2021-10-13 DIAGNOSIS — Z794 Long term (current) use of insulin: Secondary | ICD-10-CM

## 2021-10-13 DIAGNOSIS — E1165 Type 2 diabetes mellitus with hyperglycemia: Secondary | ICD-10-CM

## 2021-10-13 MED ORDER — FREESTYLE PRECISION NEO TEST VI STRP: ORAL_STRIP | 3 refills | Status: DC

## 2021-10-13 NOTE — Telephone Encounter (Signed)
Called patient. Left detail message with previous message from Dr Dwyane Dee. Waiting to hear from patient.

## 2021-10-13 NOTE — Telephone Encounter (Signed)
Rx request sent to pharmacy.  

## 2021-10-13 NOTE — Progress Notes (Signed)
EPIC Encounter for ICM Monitoring  Patient Name: Charles [name] is a 75 y.o. male Date: 10/13/2021 Primary Care Physican: Cyndi Bender, PA-C Primary Cardiologist: Claiborne Billings Electrophysiologist: [name] Bi-V Pacing:  95%         10/13/2021 Weight: 290 lbs    AT/AF Burden: <1%                                                          Spoke with patient and heart failure questions reviewed.  Pt asymptomatic for fluid accumulation .  He has a heaviness in his legs but not sure what is causing the feeling.    CorVue thoracic impedance normal.   Prescribed: Furosemide 40 mg take 0.5 tablet (20 mg total) as needed Xarelto 20 mg take 1 tablet at supper daily   Recommendations   No changes and encouraged to call if experiencing any fluid symptoms.   Follow-up plan: ICM clinic phone appointment on 11/24/2021.   91 day device clinic remote transmission 11/17/2021.   EP/Cardiology Office Visits:  Recall 07/07/2021 with Dr [name].  01/12/2022 with Dr Claiborne Billings.   Copy of ICM check sent to Dr. [name].  3 month ICM trend: 10/12/2021.    12-14 Month ICM trend:       Rosalene Billings, RN 10/13/2021 4:24 PM

## 2021-10-13 NOTE — Telephone Encounter (Signed)
Patient called  re: Patient saw commercial on TV for Ozempic and would really like to try it, if Dr. Dwyane Dee feels it would benefit and be safe for Patient to take. If so, Patient requests new RX for Ozempic be sent to:  [id number] - [place], [place] Phone:  [phone]  Fax:  [phone]

## 2021-10-14 ENCOUNTER — Other Ambulatory Visit: Payer: Self-pay | Admitting: Endocrinology

## 2021-10-14 MED ORDER — OZEMPIC (0.25 OR 0.5 MG/DOSE) 2 MG/1.5ML ~~LOC~~ SOPN: 0.5000 mg | PEN_INJECTOR | SUBCUTANEOUS | 1 refills | Status: DC

## 2021-10-14 NOTE — Telephone Encounter (Signed)
Spoke with the patient regarding Ozempic, says that he was informed by his pharmacy that he will only need to pay $50 out of pocket no matter the dosage.

## 2021-10-14 NOTE — Telephone Encounter (Signed)
Pt is still waiting for confirmation from Dr. Dwyane Dee concerning [place].

## 2021-10-19 NOTE — Telephone Encounter (Signed)
Attempted to contact the patient in regards to Ozempic and dosage changes, LVM for a call back

## 2021-10-19 NOTE — Telephone Encounter (Signed)
Attempted to contact the patient to inform him to continue medications, LVM for a call back

## 2021-10-20 NOTE — Telephone Encounter (Signed)
Informed patient to continue taking Novolog and [place] along with Ozempic and to reduce only if sugars are coming down significantly. Patient expressed understanding. Advised to give [place] a call with any further questions or concerns.

## 2021-10-27 ENCOUNTER — Telehealth: Payer: Self-pay | Admitting: Endocrinology

## 2021-10-27 NOTE — Telephone Encounter (Signed)
Patient has been informed.

## 2021-10-27 NOTE — Telephone Encounter (Signed)
PT called having side effects from using Semaglutide,0.25 or 0.5MG /DOS, (OZEMPIC, 0.25 OR 0.5 MG/DOSE,) 2 MG/1.5ML SOPN. Diarrhea, lack of balance, BS 160>, and loss of appetite. PT request call back concerning this. [phone]

## 2021-10-28 NOTE — Telephone Encounter (Signed)
NA

## 2021-11-02 ENCOUNTER — Telehealth: Payer: Self-pay | Admitting: Endocrinology

## 2021-11-02 NOTE — Telephone Encounter (Signed)
Pt called and needs direction fro Provider on if he should start back on Tresible and Novolog. He stopped taking Semaglutide,0.25 or 0.5MG /DOS, (OZEMPIC, 0.25 OR 0.5 MG/DOSE,) 2 MG/1.5ML SOPN due to side effects.   Please call PT at [phone]

## 2021-11-02 NOTE — Telephone Encounter (Signed)
Informed patient per last note he is to continue taking Novolog and [place]. Patient expressed understanding

## 2021-11-17 ENCOUNTER — Ambulatory Visit (INDEPENDENT_AMBULATORY_CARE_PROVIDER_SITE_OTHER): Payer: Medicare Other

## 2021-11-17 DIAGNOSIS — I255 Ischemic cardiomyopathy: Secondary | ICD-10-CM | POA: Diagnosis not present

## 2021-11-17 LAB — CUP PACEART REMOTE DEVICE CHECK
Battery Remaining Longevity: 65 mo
Battery Remaining Percentage: 71 %
Battery Voltage: 2.99 V
Brady Statistic AP VP Percent: 91 %
Brady Statistic AP VS Percent: 1 %
Brady Statistic AS VP Percent: 4.3 %
Brady Statistic AS VS Percent: 1 %
Brady Statistic RA Percent Paced: 87 %
Date Time Interrogation Session: 20221226020012
Implantable Lead Implant Date: 20091007
Implantable Lead Implant Date: 20091007
Implantable Lead Implant Date: 20200922
Implantable Lead Location: 753858
Implantable Lead Location: 753859
Implantable Lead Location: 753860
Implantable Pulse Generator Implant Date: 20200922
Lead Channel Impedance Value: 1250 Ohm
Lead Channel Impedance Value: 440 Ohm
Lead Channel Impedance Value: 630 Ohm
Lead Channel Pacing Threshold Amplitude: 0.75 V
Lead Channel Pacing Threshold Amplitude: 0.75 V
Lead Channel Pacing Threshold Amplitude: 1.25 V
Lead Channel Pacing Threshold Pulse Width: 0.4 ms
Lead Channel Pacing Threshold Pulse Width: 0.4 ms
Lead Channel Pacing Threshold Pulse Width: 0.5 ms
Lead Channel Sensing Intrinsic Amplitude: 12 mV
Lead Channel Sensing Intrinsic Amplitude: 2.5 mV
Lead Channel Setting Pacing Amplitude: 2 V
Lead Channel Setting Pacing Amplitude: 2.25 V
Lead Channel Setting Pacing Amplitude: 2.5 V
Lead Channel Setting Pacing Pulse Width: 0.4 ms
Lead Channel Setting Pacing Pulse Width: 0.5 ms
Lead Channel Setting Sensing Sensitivity: 2 mV
Pulse Gen Model: 3562
Pulse Gen Serial Number: 9157656

## 2021-11-20 ENCOUNTER — Encounter: Payer: Self-pay | Admitting: Internal Medicine

## 2021-11-20 ENCOUNTER — Ambulatory Visit (INDEPENDENT_AMBULATORY_CARE_PROVIDER_SITE_OTHER): Payer: Medicare Other | Admitting: Internal Medicine

## 2021-11-20 ENCOUNTER — Other Ambulatory Visit: Payer: Self-pay

## 2021-11-20 VITALS — BP 124/66 | HR 71 | Ht 69.0 in | Wt 286.0 lb

## 2021-11-20 DIAGNOSIS — I4892 Unspecified atrial flutter: Secondary | ICD-10-CM

## 2021-11-20 DIAGNOSIS — I255 Ischemic cardiomyopathy: Secondary | ICD-10-CM

## 2021-11-20 DIAGNOSIS — I10 Essential (primary) hypertension: Secondary | ICD-10-CM

## 2021-11-20 DIAGNOSIS — G4733 Obstructive sleep apnea (adult) (pediatric): Secondary | ICD-10-CM

## 2021-11-20 DIAGNOSIS — I5022 Chronic systolic (congestive) heart failure: Secondary | ICD-10-CM | POA: Diagnosis not present

## 2021-11-20 DIAGNOSIS — I2510 Atherosclerotic heart disease of native coronary artery without angina pectoris: Secondary | ICD-10-CM

## 2021-11-20 DIAGNOSIS — I495 Sick sinus syndrome: Secondary | ICD-10-CM

## 2021-11-20 DIAGNOSIS — Z9989 Dependence on other enabling machines and devices: Secondary | ICD-10-CM

## 2021-11-20 DIAGNOSIS — I447 Left bundle-branch block, unspecified: Secondary | ICD-10-CM | POA: Diagnosis not present

## 2021-11-20 DIAGNOSIS — I4891 Unspecified atrial fibrillation: Secondary | ICD-10-CM

## 2021-11-20 NOTE — Patient Instructions (Addendum)
Medication Instructions:  Your physician recommends that you continue on your current medications as directed. Please refer to the Current Medication list given to you today. *If you need a refill on your cardiac medications before your next appointment, please call your pharmacy*  Lab Work: None. If you have labs (blood work) drawn today and your tests are completely normal, you will receive your results only by: [place] (if you have MyChart) OR A paper copy in the mail If you have any lab test that is abnormal or we need to change your treatment, we will call you to review the results.  Testing/Procedures: None.  Follow-Up: At [facility], you and your health needs are our priority.  As part of our continuing mission to provide you with exceptional heart care, we have created designated Provider Care Teams.  These Care Teams include your primary Cardiologist (physician) and Advanced Practice Providers (APPs -  Physician Assistants and Nurse Practitioners) who all work together to provide you with the care you need, when you need it.  Your physician wants you to follow-up in: 12 months with   one of the following Advanced Practice Providers on your designated Care Team:     Legrand Como "Jonni Sanger" Chalmers Cater, [place]   You will receive a reminder letter in the mail two months in advance. If you don't receive a letter, please call our office to schedule the follow-up appointment.  We recommend signing up for the patient portal called "MyChart".  Sign up information is provided on this After Visit Summary.  MyChart is used to connect with patients for Virtual Visits (Telemedicine).  Patients are able to view lab/test results, encounter notes, upcoming appointments, etc.  Non-urgent messages can be sent to your provider as well.   To learn more about what you can do with MyChart, go to [email or web address].    Any Other Special Instructions Will Be Listed Below (If Applicable).
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
## 2021-11-24 ENCOUNTER — Ambulatory Visit (INDEPENDENT_AMBULATORY_CARE_PROVIDER_SITE_OTHER): Payer: Medicare Other

## 2021-11-24 DIAGNOSIS — I5022 Chronic systolic (congestive) heart failure: Secondary | ICD-10-CM

## 2021-11-24 DIAGNOSIS — Z950 Presence of cardiac pacemaker: Secondary | ICD-10-CM | POA: Diagnosis not present

## 2021-11-27 NOTE — Progress Notes (Signed)
EPIC Encounter for ICM Monitoring  Patient Name: Charles [name] is a 76 y.o. male Date: 11/27/2021 Primary Care Physican: Cyndi Bender, PA-C Primary Cardiologist: Claiborne Billings Electrophysiologist: [name] Bi-V Pacing:  99%         11/27/2020 Weight: 286.4 lbs    AT/AF Burden: <1%                                                          Spoke with patient and heart failure questions reviewed.  Pt asymptomatic for fluid accumulation.  He may be drinking more than 64 oz during decreased impedance.  He currently has the flu and sinus congestion.   CorVue thoracic impedance normal suggesting possible fluid accumulation 12/12-12/24 followed by possible dryness from 12/28-1/2.   Prescribed: Furosemide 40 mg take 0.5 tablet (20 mg total) as needed Xarelto 20 mg take 1 tablet at supper daily   Recommendations  Advised to drink 64 oz of fluid daily.   No changes and encouraged to call if experiencing any fluid symptoms.   Follow-up plan: ICM clinic phone appointment on 12/28/2021.   91 day device clinic remote transmission 02/15/2022.   EP/Cardiology Office Visits:  Recall 12/16/2021 with Oda Kilts, [place].  01/12/2022 with Dr Claiborne Billings.   Copy of ICM check sent to Dr. [name].  3 month ICM trend: 11/24/2021.    12-14 Month ICM trend:     Rosalene Billings, RN 11/27/2021 1:18 PM

## 2021-11-27 NOTE — Progress Notes (Signed)
Remote pacemaker transmission.   

## 2021-12-01 ENCOUNTER — Other Ambulatory Visit: Payer: Self-pay | Admitting: Endocrinology

## 2021-12-01 ENCOUNTER — Other Ambulatory Visit (INDEPENDENT_AMBULATORY_CARE_PROVIDER_SITE_OTHER): Payer: Medicare Other

## 2021-12-01 ENCOUNTER — Other Ambulatory Visit: Payer: Self-pay

## 2021-12-01 DIAGNOSIS — E1165 Type 2 diabetes mellitus with hyperglycemia: Secondary | ICD-10-CM

## 2021-12-01 DIAGNOSIS — Z794 Long term (current) use of insulin: Secondary | ICD-10-CM

## 2021-12-01 LAB — COMPREHENSIVE METABOLIC PANEL
ALT: 14 U/L (ref 0–53)
AST: 12 U/L (ref 0–37)
Albumin: 4.1 g/dL (ref 3.5–5.2)
Alkaline Phosphatase: 68 U/L (ref 39–117)
BUN: 18 mg/dL (ref 6–23)
CO2: 24 mEq/L (ref 19–32)
Calcium: 9 mg/dL (ref 8.4–10.5)
Chloride: 106 mEq/L (ref 96–112)
Creatinine, Ser: 1.08 mg/dL (ref 0.40–1.50)
GFR: 67.4 mL/min (ref >60.00)
Glucose, Bld: 81 mg/dL (ref 70–99)
Potassium: 4.1 mEq/L (ref 3.5–5.1)
Sodium: 138 mEq/L (ref 135–145)
Total Bilirubin: 0.6 mg/dL (ref 0.2–1.2)
Total Protein: 7 g/dL (ref 6.0–8.3)

## 2021-12-01 LAB — HEMOGLOBIN A1C: Hgb A1c MFr Bld: 6.8 % — ABNORMAL HIGH (ref 4.6–6.5)

## 2021-12-04 ENCOUNTER — Ambulatory Visit: Payer: Medicare Other | Admitting: Endocrinology

## 2021-12-08 ENCOUNTER — Ambulatory Visit (INDEPENDENT_AMBULATORY_CARE_PROVIDER_SITE_OTHER): Payer: Medicare Other | Admitting: Endocrinology

## 2021-12-08 ENCOUNTER — Encounter: Payer: Self-pay | Admitting: Endocrinology

## 2021-12-08 ENCOUNTER — Other Ambulatory Visit: Payer: Self-pay

## 2021-12-08 VITALS — BP 142/82 | HR 69 | Ht 69.0 in | Wt 291.6 lb

## 2021-12-08 DIAGNOSIS — Z794 Long term (current) use of insulin: Secondary | ICD-10-CM | POA: Diagnosis not present

## 2021-12-08 DIAGNOSIS — I10 Essential (primary) hypertension: Secondary | ICD-10-CM | POA: Diagnosis not present

## 2021-12-08 DIAGNOSIS — E1165 Type 2 diabetes mellitus with hyperglycemia: Secondary | ICD-10-CM | POA: Diagnosis not present
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
## 2021-12-08 NOTE — Patient Instructions (Addendum)
Tresiba 46 or 48 to keep am sugar <130  Take extra 4 units for every 50 mg over 150  Check on Libre 2 meter and sensor

## 2021-12-09 ENCOUNTER — Other Ambulatory Visit: Payer: Self-pay | Admitting: Endocrinology

## 2021-12-15 ENCOUNTER — Ambulatory Visit: Payer: Medicare Other | Admitting: Internal Medicine

## 2021-12-23 ENCOUNTER — Telehealth: Payer: Self-pay

## 2021-12-23 DIAGNOSIS — Z79899 Other long term (current) drug therapy: Secondary | ICD-10-CM

## 2021-12-23 DIAGNOSIS — I5022 Chronic systolic (congestive) heart failure: Secondary | ICD-10-CM

## 2021-12-23 NOTE — Telephone Encounter (Signed)
Pt called to report after a workout at the gym yesterday 9 sets 20 reps using Bowflex yesterday without any SOB or sx.    Last night and today he experienced SOB when climbing stairs and lightheadedness.   He has not missed any dosages of his medications. He has taken 20 mg of PRN Lasix today and a 2nd dose of Carvedilol today thinking a 2nd dose would help with BP but has not.  BP readings from today: 163/93   HR 74 144/94   HR 70 165/75   HR 70 164/106 HR 72 176/100 HR 70 175/90   HR 70 174-105 HR 70  Recommendations:  Advised to take an additional PRN Lasix 20 mg PRN tomorrow.   Advised I will send copy to Dr Evette Georges office for triage to review and consult with Dr Claiborne Billings regarding BP readings and recommendations.  2/1 Thoracic impedance suggesting possible fluid accumulation starting 1/19.  No abnormal episodes noted on report other than Corvue Impedance.  Will recheck fluid levels 2/6.

## 2021-12-23 NOTE — Telephone Encounter (Signed)
Will route to [facility] to review and give recommendations.

## 2021-12-23 NOTE — Telephone Encounter (Signed)
Plan to increase entresto to 49/51 mg bid and repeat Bmet, BNP in 2 weeks

## 2021-12-24 MED ORDER — ENTRESTO 49-51 MG PO TABS: 1.0000 | ORAL_TABLET | Freq: Two times a day (BID) | ORAL | 3 refills | Status: DC

## 2021-12-24 NOTE — Telephone Encounter (Signed)
-   Pt updated with MD's recommendations and verbalized understanding -Medication list updated - Lab orders placed

## 2021-12-24 NOTE — Telephone Encounter (Signed)
Left message to call back  

## 2021-12-28 ENCOUNTER — Ambulatory Visit (INDEPENDENT_AMBULATORY_CARE_PROVIDER_SITE_OTHER): Payer: Medicare Other

## 2021-12-28 DIAGNOSIS — I5022 Chronic systolic (congestive) heart failure: Secondary | ICD-10-CM

## 2021-12-28 DIAGNOSIS — Z950 Presence of cardiac pacemaker: Secondary | ICD-10-CM | POA: Diagnosis not present

## 2021-12-30 ENCOUNTER — Telehealth: Payer: Self-pay

## 2021-12-30 NOTE — Progress Notes (Signed)
EPIC Encounter for ICM Monitoring  Patient Name: Charles [name] is a 76 y.o. male Date: 12/30/2021 Primary Care Physican: Cyndi Bender, PA-C Primary Cardiologist: Claiborne Billings Electrophysiologist: [name] Bi-V Pacing:  99%         11/27/2020 Weight: 286.4 lbs    AT/AF Burden: 0%                                                          Attempted call to patient and unable to reach.  Left detailed message per DPR regarding transmission. Transmission reviewed.    CorVue thoracic impedance normal but was suggesting possible fluid accumulation 1/23-2/1.   Prescribed: Furosemide 40 mg take 0.5 tablet (20 mg total) as needed Xarelto 20 mg take 1 tablet at supper daily   Recommendations Left voice mail with ICM number and encouraged to call if experiencing any fluid symptoms.   Follow-up plan: ICM clinic phone appointment on 02/01/2022.   91 day device clinic remote transmission 02/15/2022.   EP/Cardiology Office Visits:  Recall 12/16/2021 with Oda Kilts, [place].  01/12/2022 with Dr Claiborne Billings.   Copy of ICM check sent to Dr. [name].  3 month ICM trend: 12/28/2021.    12-14 Month ICM trend:     Rosalene Billings, RN 12/30/2021 4:45 PM

## 2021-12-30 NOTE — Telephone Encounter (Signed)
Remote ICM transmission received.  Attempted call to patient regarding ICM remote transmission and left detailed message per DPR.  Advised to return call for any fluid symptoms or questions.  

## 2022-01-06 LAB — BASIC METABOLIC PANEL
BUN/Creatinine Ratio: 16 (ref 10–24)
BUN: 18 mg/dL (ref 8–27)
CO2: 23 mmol/L (ref 20–29)
Calcium: 9.6 mg/dL (ref 8.6–10.2)
Chloride: 100 mmol/L (ref 96–106)
Creatinine, Ser: 1.11 mg/dL (ref 0.76–1.27)
Glucose: 169 mg/dL — ABNORMAL HIGH (ref 70–99)
Potassium: 4.8 mmol/L (ref 3.5–5.2)
Sodium: 138 mmol/L (ref 134–144)
eGFR: 69 mL/min/{1.73_m2} (ref >59)

## 2022-01-06 LAB — BRAIN NATRIURETIC PEPTIDE: BNP: 168.6 pg/mL — ABNORMAL HIGH (ref 0.0–100.0)

## 2022-01-11 ENCOUNTER — Ambulatory Visit (INDEPENDENT_AMBULATORY_CARE_PROVIDER_SITE_OTHER): Payer: Medicare Other

## 2022-01-11 DIAGNOSIS — Z950 Presence of cardiac pacemaker: Secondary | ICD-10-CM

## 2022-01-11 DIAGNOSIS — I5022 Chronic systolic (congestive) heart failure: Secondary | ICD-10-CM

## 2022-01-11 NOTE — Progress Notes (Signed)
EPIC Encounter for ICM Monitoring  Patient Name: Charles [name] is a 76 y.o. male Date: 01/11/2022 Primary Care Physican: Cyndi Bender, PA-C Primary Cardiologist: Claiborne Billings Electrophysiologist: [name] Bi-V Pacing:  98%         11/27/2020 Weight: 286.4 lbs    AT/AF Burden: 0%                                                          Spoke with patient and heart failure questions reviewed.  Pt is getting over a virus and been drinking extra fluids this past weekend.   Does not have any fluid symptoms.    CorVue thoracic impedance suggesting possible fluid accumulation starting 2/17 and returning close to baseline normal.   Prescribed: Furosemide 40 mg take 0.5 tablet (20 mg total) as needed Xarelto 20 mg take 1 tablet at supper daily   Recommendations:  No changes and encouraged to call if experiencing any fluid symptoms.   Follow-up plan: ICM clinic phone appointment on 02/08/2022.   91 day device clinic remote transmission 02/15/2022.   EP/Cardiology Office Visits:  Recall 12/16/2021 with Oda Kilts, [place].  01/12/2022 with Dr Claiborne Billings.   Copy of ICM check sent to Dr. [name].  3 month ICM trend: 01/11/2022.    12-14 Month ICM trend:     Rosalene Billings, RN 01/11/2022 2:00 PM

## 2022-01-12 ENCOUNTER — Other Ambulatory Visit: Payer: Self-pay

## 2022-01-12 ENCOUNTER — Encounter: Payer: Self-pay | Admitting: Cardiovascular Disease

## 2022-01-12 ENCOUNTER — Ambulatory Visit (INDEPENDENT_AMBULATORY_CARE_PROVIDER_SITE_OTHER): Payer: Medicare Other | Admitting: Cardiovascular Disease

## 2022-01-12 ENCOUNTER — Telehealth: Payer: Self-pay | Admitting: *Deleted

## 2022-01-12 VITALS — BP 130/77 | HR 97 | Ht 69.0 in | Wt 295.0 lb

## 2022-01-12 DIAGNOSIS — Z951 Presence of aortocoronary bypass graft: Secondary | ICD-10-CM

## 2022-01-12 DIAGNOSIS — E1159 Type 2 diabetes mellitus with other circulatory complications: Secondary | ICD-10-CM

## 2022-01-12 DIAGNOSIS — I2510 Atherosclerotic heart disease of native coronary artery without angina pectoris: Secondary | ICD-10-CM | POA: Diagnosis not present

## 2022-01-12 DIAGNOSIS — I255 Ischemic cardiomyopathy: Secondary | ICD-10-CM

## 2022-01-12 DIAGNOSIS — I4891 Unspecified atrial fibrillation: Secondary | ICD-10-CM

## 2022-01-12 DIAGNOSIS — Z7901 Long term (current) use of anticoagulants: Secondary | ICD-10-CM

## 2022-01-12 DIAGNOSIS — Z950 Presence of cardiac pacemaker: Secondary | ICD-10-CM

## 2022-01-12 DIAGNOSIS — I10 Essential (primary) hypertension: Secondary | ICD-10-CM

## 2022-01-12 DIAGNOSIS — G4733 Obstructive sleep apnea (adult) (pediatric): Secondary | ICD-10-CM | POA: Diagnosis not present

## 2022-01-12 DIAGNOSIS — E785 Hyperlipidemia, unspecified: Secondary | ICD-10-CM

## 2022-01-12 DIAGNOSIS — Z794 Long term (current) use of insulin: Secondary | ICD-10-CM

## 2022-01-12 DIAGNOSIS — Z9989 Dependence on other enabling machines and devices: Secondary | ICD-10-CM

## 2022-01-12 MED ORDER — CARVEDILOL 12.5 MG PO TABS: 12.5000 mg | ORAL_TABLET | Freq: Two times a day (BID) | ORAL | 3 refills | Status: DC

## 2022-01-12 NOTE — Telephone Encounter (Signed)
Per VO from Dr Bertha Stakes Airfit F30i mask ordered from [employer] via [employer] portal.

## 2022-01-12 NOTE — Progress Notes (Signed)
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   BNP 203.4 (H) 03/31/2018 1110   BNP 77.0 07/03/2013 1604    ProBNP    Component Value Date/Time   PROBNP 739 (H) 08/07/2018 0912   PROBNP 364.0 (H) 10/21/2015 0932     Lipid Panel     Component Value Date/Time   CHOL 165 05/20/2021 0000   TRIG 217 (A) 05/20/2021 0000   HDL 42 05/20/2021 0000   CHOLHDL 2 10/11/2019 0932   VLDL 17.8 10/11/2019 0932   LDLCALC 86 05/20/2021 0000     RADIOLOGY: No results found.   Additional studies/ records that were reviewed today include:  I reviewed the patient's hospitalizations, office visits evaluations, notes from Dr. Servando Snare, laboratory, and  catheterization data, I reviewed his echo Doppler study as well as his recent evaluation from [facility]. Recent hospitalization for ICD implant. A new download was obtained of his CPAP unit from October 17 through October 07, 2019.  EP records were reviewed.  Download was obtained from April 14 through Apr 03, 2021 which shows excellent compliance with average use at 9 hours and 16  minutes.  There is significant mask leak.  AutoSet pressure range was from 16 to 20 cm.  AHI 13.2.  A new download was obtained from January 22 through January 11, 2022 as noted above  ASSESSMENT:    1. Atrial fibrillation, unspecified type ([place])   2. CAD in native artery: Status post multiple interventions   3. S/P CABG x 4  4. OSA on CPAP   5. Essential hypertension   6. Hyperlipidemia with target LDL less than 70   7. Status post biventricular CRT-P   8. Chronic anticoagulation   9. Morbid obesity ([place])   10. Cardiomyopathy, ischemic   11. Type 2 diabetes mellitus with other circulatory complication, with long-term current use of insulin (HCC)     PLAN:  Mr. Dingley is a 76 year-old gentleman who had previously undergone multiple coronary interventions since 1995 for significant coronary obstructive disease.  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 Servando Snare on 09/15/2016.  He has a history of PAF and was maintaining sinus rhythm with atrial pacing.  He is on Xarelto for anticoagulation.   His pacemaker is followed by Dr. [name] and in September 2020 had implantation/upgrade of a dual chamber PPM to CRT-P on 08/14/2019 by Dr [name]. and received a Economist model [phone] (serial number [id number]) lead, Beaver MP model [id number] (serial  Number [id number]) biventricular pacemaker.  His most echo on August 13, 2019 revealed an EF of 35 to 40% with mild LVH grade 1 diastolic dysfunction, and moderate mitral regurgitation.  Most recently, he is now on Entresto 24/26 mg twice a day, Farxiga 2.5 mg twice a day, carvedilol 6.25 mg twice a day, and as needed furosemide.  His echo study from May 07, 2021 demonstrated normalization of LV function with EF at 55 to 60%.  There were no wall motion abnormalities.  There was grade 1 diastolic dysfunction.  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  Mr. Schachter had recently seen Dr. [name] prior to his change of positions.  At that time he was maintaining sinus rhythm.  Presently, Mr. Suess is in atrial fibrillation again with a ventricular rate in the upper 90s.  He was unaware of this irregularity.  I have recommended he titrate carvedilol from his present dose of 6.25 mg twice a day up to 12.5 mg twice a day.  He will continue Entresto 49/51 mg twice a day at the present dose, [place] and he takes furosemide as needed.  I am recommending he undergo a follow-up echo Doppler study for reassessment.  Since Dr. [name] is no longer in regular practice, I will refer him to EP to reestablish care.  I reviewed his most recent CPAP download and AHI is elevated at 14.1 and he is at maximum CPAP pressure of 20 cm.  This may be contributed to his his significant mask leak.  He is compliant with over 9 hours and 10 minutes of CPAP use per night.  He has been using a nasal mask which may not be appropriate particularly with his high pressure requirements and will change him to a ResMed AirFit F30i mask which I believe he will tolerate and do better.  If he continues to have significantly elevated AHI we will need to change him to BiPAP therapy.  He will monitor his blood pressure closely.  I reviewed laboratory from January 05, 2022 which showed an elevated glucose of 169.  Creatinine was 1.11, potassium 4.8.  BNP was improved but remain mildly increased to 168.  If blood pressure remains elevated and laboratory remains stable spironolactone and or further titration of Entresto can be undertaken.  He is anticoagulated on Xarelto 20 mg.  Remotely, he did not tolerate statins even very low-dose and I  recommended addition of Zetia.  Target LDL is less than 70 and if he cannot achieve goal consider candidacy for PCSK9 inhibition or inclisiran. He has a follow-up appointment to see Dr. Dwyane Dee for endocrine follow-up  Time spent 40  minutes  Medication Adjustments/Labs and Tests Ordered: Current medicines are reviewed at length with the patient today.  Concerns regarding medicines are outlined above.  Medication changes, Labs and Tests ordered today are listed in the Patient Instructions below. Patient Instructions  Medication Instructions:  INCREASE carvedilol to 12.71m twice daily  *If you need a refill on your cardiac medications before your next appointment, please call your pharmacy*  Testing/Procedures: Your physician has requested that you have an echocardiogram. Echocardiography is a painless test that uses sound waves to create images of your heart. It provides your doctor with information about the size and shape of your heart and how well your hearts chambers and valves are working. This procedure takes approximately one hour. There are no restrictions for this procedure. [facility] [facility]  Follow-Up: At [facility] you and your health needs are our priority.  As part of our continuing mission to provide you with exceptional heart care, we have created designated Provider Care Teams.  These Care Teams include your primary Cardiologist (physician) and Advanced Practice Providers (APPs -  Physician Assistants and Nurse Practitioners) who all work together to provide you with the care you need, when you need it.  We recommend signing up for the patient portal called "MyChart".  Sign up information is provided on this After Visit Summary.  MyChart is used to connect with patients for Virtual Visits (Telemedicine).  Patients are able to view lab/test results, encounter notes, upcoming appointments, etc.  Non-urgent messages can be sent to your provider as well.   To learn more about what you can do with MyChart, go to [email or web address]    Your next appointment:    [facility] Follow up with  Dr. KClaiborne Billingsin 6 months     Signed, TShelva Majestic MD  01/17/2022 11:50 AM    [facility] [address] [place] [place]  [id number]: [phone]

## 2022-01-12 NOTE — Patient Instructions (Signed)
Medication Instructions:  INCREASE carvedilol to 12.5mg  twice daily  *If you need a refill on your cardiac medications before your next appointment, please call your pharmacy*  Testing/Procedures: Your physician has requested that you have an echocardiogram. Echocardiography is a painless test that uses sound waves to create images of your heart. It provides your doctor with information about the size and shape of your heart and how well your hearts chambers and valves are working. This procedure takes approximately one hour. There are no restrictions for this procedure. This will be done at [address] [employer]   Follow-Up: At [facility], you and your health needs are our priority.  As part of our continuing mission to provide you with exceptional heart care, we have created designated Provider Care Teams.  These Care Teams include your primary Cardiologist (physician) and Advanced Practice Providers (APPs -  Physician Assistants and Nurse Practitioners) who all work together to provide you with the care you need, when you need it.  We recommend signing up for the patient portal called "MyChart".  Sign up information is provided on this After Visit Summary.  MyChart is used to connect with patients for Virtual Visits (Telemedicine).  Patients are able to view lab/test results, encounter notes, upcoming appointments, etc.  Non-urgent messages can be sent to your provider as well.   To learn more about what you can do with MyChart, go to [email or web address].    Your next appointment:    Schedule appointment with electrophysiologist at [facility]  Follow up with Dr. Claiborne Billings in 6 months

## 2022-01-14 ENCOUNTER — Other Ambulatory Visit (INDEPENDENT_AMBULATORY_CARE_PROVIDER_SITE_OTHER): Payer: Medicare Other

## 2022-01-14 ENCOUNTER — Other Ambulatory Visit: Payer: Self-pay

## 2022-01-14 ENCOUNTER — Other Ambulatory Visit: Payer: Self-pay | Admitting: Endocrinology

## 2022-01-14 DIAGNOSIS — E1165 Type 2 diabetes mellitus with hyperglycemia: Secondary | ICD-10-CM

## 2022-01-14 DIAGNOSIS — Z794 Long term (current) use of insulin: Secondary | ICD-10-CM

## 2022-01-14 LAB — BASIC METABOLIC PANEL
BUN: 24 mg/dL — ABNORMAL HIGH (ref 6–23)
CO2: 31 mEq/L (ref 19–32)
Calcium: 8.9 mg/dL (ref 8.4–10.5)
Chloride: 104 mEq/L (ref 96–112)
Creatinine, Ser: 1.32 mg/dL (ref 0.40–1.50)
GFR: 52.93 mL/min — ABNORMAL LOW (ref >60.00)
Glucose, Bld: 111 mg/dL — ABNORMAL HIGH (ref 70–99)
Potassium: 5.1 mEq/L (ref 3.5–5.1)
Sodium: 141 mEq/L (ref 135–145)

## 2022-01-14 LAB — HEMOGLOBIN A1C: Hgb A1c MFr Bld: 6.6 % — ABNORMAL HIGH (ref 4.6–6.5)

## 2022-01-15 ENCOUNTER — Telehealth: Payer: Self-pay

## 2022-01-15 NOTE — Telephone Encounter (Signed)
Spoke with patient.  He was asking who would be setting up the echo that Dr Claiborne Billings ordered.  Advised it would be Dr Evette Georges nurse and to call the office to discuss if needed.  He verbalized understanding.

## 2022-01-15 NOTE — Telephone Encounter (Signed)
Attempted return call to patient per voice mail request.  Person answering phone stated he was still sleeping and will have him give me a call.

## 2022-01-17 ENCOUNTER — Encounter: Payer: Self-pay | Admitting: Cardiovascular Disease

## 2022-01-20 ENCOUNTER — Other Ambulatory Visit: Payer: Self-pay

## 2022-01-20 ENCOUNTER — Ambulatory Visit (INDEPENDENT_AMBULATORY_CARE_PROVIDER_SITE_OTHER): Payer: Medicare Other | Admitting: Endocrinology

## 2022-01-20 ENCOUNTER — Telehealth: Payer: Self-pay

## 2022-01-20 ENCOUNTER — Encounter: Payer: Self-pay | Admitting: Endocrinology

## 2022-01-20 VITALS — BP 140/78 | HR 80 | Ht 69.0 in | Wt 296.2 lb

## 2022-01-20 DIAGNOSIS — I255 Ischemic cardiomyopathy: Secondary | ICD-10-CM

## 2022-01-20 DIAGNOSIS — I10 Essential (primary) hypertension: Secondary | ICD-10-CM | POA: Diagnosis not present

## 2022-01-20 DIAGNOSIS — E1165 Type 2 diabetes mellitus with hyperglycemia: Secondary | ICD-10-CM

## 2022-01-20 DIAGNOSIS — Z794 Long term (current) use of insulin: Secondary | ICD-10-CM | POA: Diagnosis not present

## 2022-01-20 LAB — MICROALBUMIN / CREATININE URINE RATIO
Creatinine,U: 104.2 mg/dL
Microalb Creat Ratio: 15.8 mg/g (ref 0.0–30.0)
Microalb, Ur: 16.4 mg/dL — ABNORMAL HIGH (ref 0.0–1.9)
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
## 2022-01-20 NOTE — Patient Instructions (Addendum)
15 Novolog in am ? ?Check blood sugars on waking up 7 days a week ? ?Also check blood sugars about 2 hours after meals and do this after different meals by rotation ? ?Recommended blood sugar levels on waking up are 90-130 and about 2 hours after meal is 130-160 ? ?Please bring your blood sugar monitor to each visit, thank you ? ?

## 2022-01-20 NOTE — Telephone Encounter (Signed)
Lets get him in to the AF clinic over the next few weeks.  No urgent. ?

## 2022-01-20 NOTE — Telephone Encounter (Signed)
Repeat Abbott alert for exceed AT/AF ?Ongoing AF from 2/20 with overall controlled ventricular rates ?Hx of PAF, burden 14%, Coreg, Xarelto.  ?Corvue trending down ?Re-route for ongoing arrhythmia ? ?Pt has history of PAF on Xarelto.  Episode now persistent lasting >7 days.   ? ?Spoke with patient, he is aware of being in AFIB as he was seen by Dr. Claiborne Billings on 2/21.  Dr. Claiborne Billings had him increase his Carvedilol to 12.5 mg BID.  He reports he is asymptomatic.  He is concerned about risk for stroke.  Advised patient of importance of compliance with Xarelto for this reason.   ? ?Advised I would forward to Dr. [name] for review and recommendation.   ? ? ? ? ? ?

## 2022-01-25 ENCOUNTER — Telehealth: Payer: Self-pay | Admitting: Cardiovascular Disease

## 2022-01-25 NOTE — Telephone Encounter (Signed)
Patient returning call.

## 2022-01-25 NOTE — Telephone Encounter (Signed)
Information given to Dr. Percival Spanish DOD. He ordered afib clinic appointment for patient. Afib clinic gave patient appointment for 3/8. Patient advised of afib and afib clinic appointment. Recommended that if sob or dizziness increases, for him to go to the ED. Pt voiced understanding of this conversation. He had no other questions or concerns at this time. ?

## 2022-01-25 NOTE — Telephone Encounter (Signed)
Device RN requested to watch for transmission as patient called gen cards c/o increased fatigue, dizziness, and shortness of breath. Presenting rhythm AF. Appears to also be volume overloaded per CorVue. Routing back to RN and Dr. Claiborne Billings high alert. Would benefit from AF clinic.  ? ? ? ? ? ? ? ? ? ?

## 2022-01-25 NOTE — Telephone Encounter (Signed)
Patient's sister answered phone. Gave her message to have patient call clinic back at [phone]; that we were returning his call to [place]. ?

## 2022-01-25 NOTE — Telephone Encounter (Signed)
Patient c/o Palpitations:  High priority if patient c/o lightheadedness, shortness of breath, or chest pain ? ?How long have you had palpitations/irregular HR/ Afib? About a week , since he saw Dr. Claiborne Billings ? ?Are you having the symptoms now? yes ? ?Are you currently experiencing lightheadedness, SOB or CP? SOB ? ?Do you have a history of afib (atrial fibrillation) or irregular heart rhythm? yes ? ?Have you checked your BP or HR? (document readings if available):  ? 01/24/22: 134/69 HR 81 ? Patient has not taken it yet today  ? ?Are you experiencing any other symptoms? Fatigue, SOB ? ? ?Pt c/o Shortness Of Breath: STAT if SOB developed within the last 24 hours or pt is noticeably SOB on the phone ? ?1. Are you currently SOB (can you hear that pt is SOB on the phone)? Yes a little ? ?2. How long have you been experiencing SOB? About a week ? ?3. Are you SOB when sitting or when up moving around? Up and moving around ? ? ?4. Are you currently experiencing any other symptoms? See above ?

## 2022-01-25 NOTE — Telephone Encounter (Signed)
ok 

## 2022-01-25 NOTE — Telephone Encounter (Signed)
Patient reports sob, especially with exertion, dizziness, and lightheadedness. This has increased since his last visit with Dr. Claiborne Billings on 2/21. SOB is noticeable over the phone. This am BP 125/55, P 81. Had him do a pacer download. Denies chest pain. Feels "pooped". Constantly tired. Recommended to patient that if symptoms worsen, to go to the ED. Informed patient we will notify him the results of pacer download once we have them. ?

## 2022-01-26 NOTE — Telephone Encounter (Signed)
Appt made

## 2022-01-27 ENCOUNTER — Other Ambulatory Visit: Payer: Self-pay

## 2022-01-27 ENCOUNTER — Encounter (HOSPITAL_COMMUNITY): Payer: Self-pay | Admitting: Physician Assistant

## 2022-01-27 ENCOUNTER — Ambulatory Visit (HOSPITAL_COMMUNITY)
Admission: RE | Admit: 2022-01-27 | Discharge: 2022-01-27 | Disposition: A | Discharge status: Home / Self Care | Payer: Medicare Other | Source: Ambulatory Visit | Attending: Physician Assistant | Admitting: Physician Assistant

## 2022-01-27 VITALS — BP 110/78 | HR 87 | Ht 69.0 in | Wt 294.8 lb

## 2022-01-27 DIAGNOSIS — I5042 Chronic combined systolic (congestive) and diastolic (congestive) heart failure: Secondary | ICD-10-CM | POA: Insufficient documentation

## 2022-01-27 DIAGNOSIS — I2510 Atherosclerotic heart disease of native coronary artery without angina pectoris: Secondary | ICD-10-CM | POA: Insufficient documentation

## 2022-01-27 DIAGNOSIS — D6869 Other thrombophilia: Secondary | ICD-10-CM | POA: Diagnosis not present

## 2022-01-27 DIAGNOSIS — R001 Bradycardia, unspecified: Secondary | ICD-10-CM | POA: Diagnosis not present

## 2022-01-27 DIAGNOSIS — I130 Hypertensive heart and chronic kidney disease with heart failure and stage 1 through stage 4 chronic kidney disease, or unspecified chronic kidney disease: Secondary | ICD-10-CM | POA: Insufficient documentation

## 2022-01-27 DIAGNOSIS — N189 Chronic kidney disease, unspecified: Secondary | ICD-10-CM | POA: Diagnosis not present

## 2022-01-27 DIAGNOSIS — G4733 Obstructive sleep apnea (adult) (pediatric): Secondary | ICD-10-CM | POA: Diagnosis not present

## 2022-01-27 DIAGNOSIS — I4819 Other persistent atrial fibrillation: Secondary | ICD-10-CM | POA: Diagnosis present

## 2022-01-27 DIAGNOSIS — Z6841 Body Mass Index (BMI) 40.0 and over, adult: Secondary | ICD-10-CM | POA: Insufficient documentation

## 2022-01-27 DIAGNOSIS — E669 Obesity, unspecified: Secondary | ICD-10-CM | POA: Diagnosis not present

## 2022-01-27 DIAGNOSIS — Z7901 Long term (current) use of anticoagulants: Secondary | ICD-10-CM | POA: Insufficient documentation

## 2022-01-27 HISTORY — DX: Other thrombophilia: D68.69
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
## 2022-01-27 NOTE — Patient Instructions (Addendum)
Stop by the lab at [facility] while there for your echo  ? ?Cardioversion scheduled for Wednesday, March 29th ? - Arrive at the [facility] and go to admitting at 630am ? - Do not eat or drink anything after midnight the night prior to your procedure. ? - Take all your morning medication (except diabetic medications) with a sip of water prior to arrival. ? - You will not be able to drive home after your procedure. ? - Do NOT miss any doses of your blood thinner - if you should miss a dose please notify our office immediately. ? - If you feel as if you go back into normal rhythm prior to scheduled cardioversion, please notify our office immediately. If your procedure is canceled in the cardioversion suite you will be charged a cancellation fee. ? ?
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
## 2022-02-08 ENCOUNTER — Telehealth: Payer: Self-pay

## 2022-02-08 ENCOUNTER — Ambulatory Visit (INDEPENDENT_AMBULATORY_CARE_PROVIDER_SITE_OTHER): Payer: Medicare Other

## 2022-02-08 DIAGNOSIS — I5022 Chronic systolic (congestive) heart failure: Secondary | ICD-10-CM | POA: Diagnosis not present

## 2022-02-08 DIAGNOSIS — Z950 Presence of cardiac pacemaker: Secondary | ICD-10-CM | POA: Diagnosis not present

## 2022-02-08 NOTE — Telephone Encounter (Signed)
Patient left voicemail stating he needs glucose tabs sent to pharmacy. He is having a procedure coming up and cannot drink glucerna. Please advise ?

## 2022-02-08 NOTE — Progress Notes (Signed)
EPIC Encounter for ICM Monitoring ? ?Patient Name: Charles [name] is a 76 y.o. male ?Date: 02/08/2022 ?Primary Care Physican: Cyndi Bender, PA-C ?Primary Cardiologist: Claiborne Billings ?Electrophysiologist: [name] ?Bi-V Pacing:  72% (was 98% on 2/20 report)        ?11/27/2020 Weight: 286.4 lbs  ?  ?AT/AF Burden: 34% ?                                                         ?Spoke with patient and heart failure questions reviewed.  Pt asymptomatic for fluid accumulation.  He is getting SOB on walking down the driveway and feels that is related to afib.   Pt seen in Afib clinic on 3/8 and scheduled for cardioversion 3/29.  ?  ?CorVue thoracic impedance suggesting possible fluid accumulation starting 2/19 which correlates with possible start of afib ?  ?Prescribed: ?Furosemide 40 mg take 0.5 tablet (20 mg total) as needed ?Xarelto 20 mg take 1 tablet at supper daily ?  ?Recommendations:  He took 40 mg of Furosemide 3/19 and 20 mg this AM.  Encouraged to call for changes in condition. ?  ?Follow-up plan: ICM clinic phone appointment on 02/15/2022 to recheck fluid levels.   91 day device clinic remote transmission 02/15/2022. ?  ?EP/Cardiology Office Visits:  Recall 11/15/2022 with Oda Kilts, PA.  Recall 07/11/2022 with Dr Claiborne Billings. ?  ?Copy of ICM check sent to Dr. [name]. ? ?3 month ICM trend: 02/08/2022. ? ? ? ?12-14 Month ICM trend:  ? ? ? ?Rosalene Billings, RN ?02/08/2022 ?8:42 AM ? ?

## 2022-02-09 ENCOUNTER — Encounter (HOSPITAL_COMMUNITY): Payer: Self-pay | Admitting: Internal Medicine

## 2022-02-11 ENCOUNTER — Other Ambulatory Visit (HOSPITAL_COMMUNITY): Payer: Self-pay | Admitting: *Deleted

## 2022-02-11 ENCOUNTER — Ambulatory Visit (HOSPITAL_COMMUNITY): Payer: Medicare Other | Attending: Cardiovascular Disease

## 2022-02-11 ENCOUNTER — Other Ambulatory Visit: Payer: Medicare Other

## 2022-02-11 ENCOUNTER — Other Ambulatory Visit: Payer: Self-pay

## 2022-02-11 DIAGNOSIS — I4819 Other persistent atrial fibrillation: Secondary | ICD-10-CM

## 2022-02-11 DIAGNOSIS — I4891 Unspecified atrial fibrillation: Secondary | ICD-10-CM | POA: Diagnosis present

## 2022-02-11 LAB — BASIC METABOLIC PANEL
Anion gap: 9 (ref 5–15)
BUN: 21 mg/dL (ref 8–23)
CO2: 24 mmol/L (ref 22–32)
Calcium: 9.1 mg/dL (ref 8.9–10.3)
Chloride: 108 mmol/L (ref 98–111)
Creatinine, Ser: 1.4 mg/dL — ABNORMAL HIGH (ref 0.61–1.24)
GFR, Estimated: 52 mL/min — ABNORMAL LOW (ref >60)
Glucose, Bld: 150 mg/dL — ABNORMAL HIGH (ref 70–99)
Potassium: 4.3 mmol/L (ref 3.5–5.1)
Sodium: 141 mmol/L (ref 135–145)

## 2022-02-11 LAB — CBC
HCT: 43.3 % (ref 39.0–52.0)
Hemoglobin: 13.6 g/dL (ref 13.0–17.0)
MCH: 30.3 pg (ref 26.0–34.0)
MCHC: 31.4 g/dL (ref 30.0–36.0)
MCV: 96.4 fL (ref 80.0–100.0)
Platelets: 187 10*3/uL (ref 150–400)
RBC: 4.49 MIL/uL (ref 4.22–5.81)
RDW: 14.4 % (ref 11.5–15.5)
WBC: 6 10*3/uL (ref 4.0–10.5)
nRBC: 0 % (ref 0.0–0.2)

## 2022-02-11 LAB — ECHOCARDIOGRAM COMPLETE: S' Lateral: 4.3 cm

## 2022-02-15 ENCOUNTER — Other Ambulatory Visit (HOSPITAL_COMMUNITY): Payer: Medicare Other | Admitting: Physician Assistant

## 2022-02-15 ENCOUNTER — Ambulatory Visit (INDEPENDENT_AMBULATORY_CARE_PROVIDER_SITE_OTHER): Payer: Medicare Other

## 2022-02-15 DIAGNOSIS — Z950 Presence of cardiac pacemaker: Secondary | ICD-10-CM

## 2022-02-15 DIAGNOSIS — I5022 Chronic systolic (congestive) heart failure: Secondary | ICD-10-CM

## 2022-02-15 NOTE — Progress Notes (Signed)
EPIC Encounter for ICM Monitoring ? ?Patient Name: Charles [name] is a 76 y.o. male ?Date: 02/15/2022 ?Primary Care Physican: Cyndi Bender, PA-C ?Primary Cardiologist: Claiborne Billings ?Electrophysiologist: [name] ?Bi-V Pacing:  68% (was 98% on 2/20 report)        ?11/27/2020 Weight: 286.4 lbs  ?  ?AT/AF Burden: 39% ? ?                                                         ?Spoke with patient and heart failure questions reviewed.  Pt asymptomatic for fluid accumulation.  Reports feeling well at this time and voices no complaints.   Cardioversion scheduled for 3/29.  ?  ?CorVue thoracic impedance suggesting fluid levels returned to normal after taking PRN Furosemide. ?  ?Prescribed: ?Furosemide 40 mg take 0.5 tablet (20 mg total) as needed ?Xarelto 20 mg take 1 tablet at supper daily ?  ?Recommendations:  No changes and encouraged to call if experiencing any fluid symptoms. ?  ?Follow-up plan: ICM clinic phone appointment on 03/01/2022 to recheck fluid levels.   91 day device clinic remote transmission 05/17/2022. ?  ?EP/Cardiology Office Visits:  Recall 11/15/2022 with Oda Kilts, PA.  Recall 07/11/2022 with Dr Claiborne Billings. ?  ?Copy of ICM check sent to Dr. [name]. ? ?3 month ICM trend: 02/15/2022. ? ? ? ?12-14 Month ICM trend:  ? ? ? ? ?Rosalene Billings, RN ?02/15/2022 ?10:48 AM ? ?

## 2022-02-16 LAB — CUP PACEART REMOTE DEVICE CHECK
Battery Remaining Longevity: 64 mo
Battery Remaining Percentage: 68 %
Battery Voltage: 2.99 V
Brady Statistic AP VP Percent: 98 %
Brady Statistic AP VS Percent: 1 %
Brady Statistic AS VP Percent: 1 %
Brady Statistic AS VS Percent: 1 %
Brady Statistic RA Percent Paced: 57 %
Date Time Interrogation Session: 20230327020008
Implantable Lead Implant Date: 20091007
Implantable Lead Implant Date: 20091007
Implantable Lead Implant Date: 20200922
Implantable Lead Location: 753858
Implantable Lead Location: 753859
Implantable Lead Location: 753860
Implantable Pulse Generator Implant Date: 20200922
Lead Channel Impedance Value: 1200 Ohm
Lead Channel Impedance Value: 410 Ohm
Lead Channel Impedance Value: 590 Ohm
Lead Channel Pacing Threshold Amplitude: 0.75 V
Lead Channel Pacing Threshold Amplitude: 0.75 V
Lead Channel Pacing Threshold Amplitude: 1.25 V
Lead Channel Pacing Threshold Pulse Width: 0.4 ms
Lead Channel Pacing Threshold Pulse Width: 0.4 ms
Lead Channel Pacing Threshold Pulse Width: 0.5 ms
Lead Channel Sensing Intrinsic Amplitude: 1.9 mV
Lead Channel Sensing Intrinsic Amplitude: 12 mV
Lead Channel Setting Pacing Amplitude: 2 V
Lead Channel Setting Pacing Amplitude: 2.25 V
Lead Channel Setting Pacing Amplitude: 2.5 V
Lead Channel Setting Pacing Pulse Width: 0.4 ms
Lead Channel Setting Pacing Pulse Width: 0.5 ms
Lead Channel Setting Sensing Sensitivity: 2 mV
Pulse Gen Model: 3562
Pulse Gen Serial Number: 9157656

## 2022-02-16 NOTE — Anesthesia Preprocedure Evaluation (Addendum)
Anesthesia Evaluation  ?Patient identified by MRN, date of birth, ID band ?Patient awake ? ? ? ?Reviewed: ?Allergy & Precautions, NPO status , Patient's Chart, lab work & pertinent test results ? ?Airway ?Mallampati: II ? ?TM Distance: >3 FB ?Neck ROM: Full ? ? ? Dental ? ?(+) Dental Advisory Given ?  ?Pulmonary ?asthma , sleep apnea , pneumonia, former smoker,  ?  ?Pulmonary exam normal ?breath sounds clear to auscultation ? ? ? ? ? ? Cardiovascular ?hypertension, + angina + CAD, + Past MI and +CHF  ?Normal cardiovascular exam+ dysrhythmias + Valvular Problems/Murmurs  ?Rhythm:Regular Rate:Normal ? ?Echo 01/2022 ??1. Left ventricular ejection fraction, by estimation, is 45 to 50%. The left ventricle has mildly decreased function. The left ventricle demonstrates global hypokinesis. The left ventricular internal cavity size was mildly dilated. Left ventricular diastolic function could not be evaluated.  ??2. Right ventricular systolic function is mildly reduced. The right ventricular size is normal.  ??3. Left atrial size was severely dilated.  ??4. Right atrial size was severely dilated.  ??5. The mitral valve is normal in structure. Mild mitral valve  ?regurgitation.  ??6. The aortic valve is tricuspid. Aortic valve regurgitation is not visualized. No aortic stenosis is present.  ??7. There is mild dilatation of the ascending aorta, measuring 43 mm.  ?  ?Neuro/Psych ?negative neurological ROS ?   ? GI/Hepatic ?Neg liver ROS, GERD  ,  ?Endo/Other  ?diabetesMorbid obesity ? Renal/GU ?Renal disease  ? ?  ?Musculoskeletal ?negative musculoskeletal ROS ?(+) Arthritis ,  ? Abdominal ?(+) + obese,   ?Peds ? Hematology ? ?(+) Blood dyscrasia, anemia ,   ?Anesthesia Other Findings ? ? Reproductive/Obstetrics ? ?  ? ? ? ? ? ? ? ? ? ? ? ? ? ?  ?  ? ? ? ? ? ? ? ?Anesthesia Physical ?Anesthesia Plan ? ?ASA: 3 ? ?Anesthesia Plan: General  ? ?Post-op Pain Management: Minimal or no pain anticipated   ? ?Induction: Intravenous ? ?PONV Risk Score and Plan: Propofol infusion and Treatment may vary due to age or medical condition ? ?Airway Management Planned: Natural Airway and Simple Face Mask ? ?Additional Equipment:  ? ?Intra-op Plan:  ? ?Post-operative Plan:  ? ?Informed Consent: I have reviewed the patients History and Physical, chart, labs and discussed the procedure including the risks, benefits and alternatives for the proposed anesthesia with the patient or authorized representative who has indicated his/her understanding and acceptance.  ? ? ? ?Dental advisory given ? ?Plan Discussed with: CRNA ? ?Anesthesia Plan Comments:   ? ? ? ? ? ?Anesthesia Quick Evaluation ? ?

## 2022-02-17 ENCOUNTER — Encounter (HOSPITAL_COMMUNITY)
Admission: RE | Disposition: A | Discharge status: Home / Self Care | Payer: Self-pay | Source: Home / Self Care | Attending: Internal Medicine

## 2022-02-17 ENCOUNTER — Ambulatory Visit (HOSPITAL_BASED_OUTPATIENT_CLINIC_OR_DEPARTMENT_OTHER): Payer: Medicare Other | Admitting: Anesthesiology

## 2022-02-17 ENCOUNTER — Ambulatory Visit (HOSPITAL_COMMUNITY)
Admission: RE | Admit: 2022-02-17 | Discharge: 2022-02-17 | Disposition: A | Discharge status: Home / Self Care | Payer: Medicare Other | Attending: Internal Medicine | Admitting: Internal Medicine

## 2022-02-17 ENCOUNTER — Encounter (HOSPITAL_COMMUNITY): Payer: Self-pay | Admitting: Internal Medicine

## 2022-02-17 ENCOUNTER — Ambulatory Visit (HOSPITAL_COMMUNITY): Payer: Medicare Other | Admitting: Anesthesiology

## 2022-02-17 ENCOUNTER — Other Ambulatory Visit: Payer: Self-pay | Admitting: Endocrinology

## 2022-02-17 ENCOUNTER — Other Ambulatory Visit: Payer: Self-pay | Admitting: Internal Medicine

## 2022-02-17 DIAGNOSIS — Z955 Presence of coronary angioplasty implant and graft: Secondary | ICD-10-CM | POA: Insufficient documentation

## 2022-02-17 DIAGNOSIS — Z79899 Other long term (current) drug therapy: Secondary | ICD-10-CM | POA: Insufficient documentation

## 2022-02-17 DIAGNOSIS — Z7984 Long term (current) use of oral hypoglycemic drugs: Secondary | ICD-10-CM | POA: Insufficient documentation

## 2022-02-17 DIAGNOSIS — I25119 Atherosclerotic heart disease of native coronary artery with unspecified angina pectoris: Secondary | ICD-10-CM | POA: Diagnosis not present

## 2022-02-17 DIAGNOSIS — N183 Chronic kidney disease, stage 3 unspecified: Secondary | ICD-10-CM | POA: Insufficient documentation

## 2022-02-17 DIAGNOSIS — D6869 Other thrombophilia: Secondary | ICD-10-CM | POA: Diagnosis not present

## 2022-02-17 DIAGNOSIS — Z794 Long term (current) use of insulin: Secondary | ICD-10-CM | POA: Diagnosis not present

## 2022-02-17 DIAGNOSIS — I4891 Unspecified atrial fibrillation: Secondary | ICD-10-CM | POA: Diagnosis not present

## 2022-02-17 DIAGNOSIS — I5042 Chronic combined systolic (congestive) and diastolic (congestive) heart failure: Secondary | ICD-10-CM | POA: Diagnosis not present

## 2022-02-17 DIAGNOSIS — Z7982 Long term (current) use of aspirin: Secondary | ICD-10-CM | POA: Diagnosis not present

## 2022-02-17 DIAGNOSIS — I2510 Atherosclerotic heart disease of native coronary artery without angina pectoris: Secondary | ICD-10-CM | POA: Insufficient documentation

## 2022-02-17 DIAGNOSIS — G4733 Obstructive sleep apnea (adult) (pediatric): Secondary | ICD-10-CM | POA: Diagnosis not present

## 2022-02-17 DIAGNOSIS — Z951 Presence of aortocoronary bypass graft: Secondary | ICD-10-CM | POA: Insufficient documentation

## 2022-02-17 DIAGNOSIS — I110 Hypertensive heart disease with heart failure: Secondary | ICD-10-CM

## 2022-02-17 DIAGNOSIS — Z87891 Personal history of nicotine dependence: Secondary | ICD-10-CM | POA: Diagnosis not present

## 2022-02-17 DIAGNOSIS — Z6841 Body Mass Index (BMI) 40.0 and over, adult: Secondary | ICD-10-CM | POA: Diagnosis not present

## 2022-02-17 DIAGNOSIS — I495 Sick sinus syndrome: Secondary | ICD-10-CM | POA: Insufficient documentation

## 2022-02-17 DIAGNOSIS — E669 Obesity, unspecified: Secondary | ICD-10-CM | POA: Diagnosis not present

## 2022-02-17 DIAGNOSIS — E1122 Type 2 diabetes mellitus with diabetic chronic kidney disease: Secondary | ICD-10-CM | POA: Diagnosis not present

## 2022-02-17 DIAGNOSIS — I252 Old myocardial infarction: Secondary | ICD-10-CM | POA: Diagnosis not present

## 2022-02-17 DIAGNOSIS — I130 Hypertensive heart and chronic kidney disease with heart failure and stage 1 through stage 4 chronic kidney disease, or unspecified chronic kidney disease: Secondary | ICD-10-CM | POA: Insufficient documentation

## 2022-02-17 DIAGNOSIS — I4819 Other persistent atrial fibrillation: Secondary | ICD-10-CM | POA: Diagnosis not present

## 2022-02-17 DIAGNOSIS — Z7901 Long term (current) use of anticoagulants: Secondary | ICD-10-CM | POA: Insufficient documentation

## 2022-02-17 DIAGNOSIS — Z950 Presence of cardiac pacemaker: Secondary | ICD-10-CM | POA: Insufficient documentation

## 2022-02-17 HISTORY — PX: CARDIOVERSION: SHX1299

## 2022-02-17 LAB — GLUCOSE, CAPILLARY: Glucose-Capillary: 136 mg/dL — ABNORMAL HIGH (ref 70–99)

## 2022-02-17 SURGERY — CARDIOVERSION
Anesthesia: General

## 2022-02-17 MED ORDER — LIDOCAINE 2% (20 MG/ML) 5 ML SYRINGE
INTRAMUSCULAR | Status: DC | PRN
  Administered 2022-02-17: 60 mg via INTRAVENOUS

## 2022-02-17 MED ORDER — PROPOFOL 10 MG/ML IV BOLUS
INTRAVENOUS | Status: DC | PRN
Start: 2022-02-17 — End: 2022-02-17
  Administered 2022-02-17: 60 mg via INTRAVENOUS

## 2022-02-17 MED ORDER — SODIUM CHLORIDE 0.9 % IV SOLN: INTRAVENOUS | Status: DC

## 2022-02-17 NOTE — CV Procedure (Addendum)
? ?  CARDIOVERSION NOTE ? ?Procedure: Electrical Cardioversion ?Indications:  Atrial Fibrillation ? ?Procedure Details: ? ?Consent: Risks of procedure as well as the alternatives and risks of each were explained to the (patient/caregiver).  Consent for procedure obtained. ? ?Time Out: Verified patient identification, verified procedure, site/side was marked, verified correct patient position, special equipment/implants available, medications/allergies/relevent history reviewed, required imaging and test results available.  Performed ? ?Patient placed on cardiac monitor, pulse oximetry, supplemental oxygen as necessary.  ?Sedation given:  propofol per anesthesia ?Pacer pads placed anterior and posterior chest. ? ?Cardioverted 2 time(s).  ?Cardioverted at 120J and 150J biphasic. ? ?Impression: ?Findings: Post procedure EKG shows:  paced rhythm ?Complications: None ?Patient did tolerate procedure well. ? ?Plan: ?Successful DCCV to AS/Bi-VP rhythm. ? ?Time Spent Directly with the Patient: ? ?30 minutes  ? ?Pixie Casino, MD, [facility], FACP  ?[place]  ?Medical Director of the [facility] &  ?Cardiovascular Risk Reduction Clinic ?Diplomate of the [employer] of Clinical Lipidology ?Attending Cardiologist  ?Direct Dial: [phone]  Fax: [phone]  ?Website:  [email or web address] ? ?Nadean Corwin Keilani Terrance ?02/17/2022, 8:04 AM ? ? ? ? ?

## 2022-02-17 NOTE — Transfer of Care (Signed)
Immediate Anesthesia Transfer of Care Note ? ?Patient: Charles [name] ? ?Procedure(s) Performed: CARDIOVERSION ? ?Patient Location: PACU ? ?Anesthesia Type:MAC ? ?Level of Consciousness: drowsy and patient cooperative ? ?Airway & Oxygen Therapy: Patient Spontanous Breathing ? ?Post-op Assessment: Report given to RN and Post -op Vital signs reviewed and stable ? ?Post vital signs: Reviewed and stable ? ?Last Vitals:  ?Vitals Value Taken Time  ?BP    ?Temp    ?Pulse    ?Resp    ?SpO2    ? ? ?Last Pain:  ?Vitals:  ? 02/17/22 0700  ?TempSrc: Temporal  ?PainSc: 0-No pain  ?   ? ?  ? ?Complications: No notable events documented. ?

## 2022-02-17 NOTE — Anesthesia Postprocedure Evaluation (Signed)
Anesthesia Post Note ? ?Patient: Charles [name] ? ?Procedure(s) Performed: CARDIOVERSION ? ?  ? ?Patient location during evaluation: Endoscopy ?Anesthesia Type: General ?Level of consciousness: sedated and patient cooperative ?Pain management: pain level controlled ?Vital Signs Assessment: post-procedure vital signs reviewed and stable ?Respiratory status: spontaneous breathing ?Cardiovascular status: stable ?Anesthetic complications: no ? ? ?No notable events documented. ? ?Last Vitals:  ?Vitals:  ? 02/17/22 0810 02/17/22 0820  ?BP: 129/71 112/74  ?Pulse: 70 71  ?Resp: 15 18  ?Temp:    ?SpO2: 94% 96%  ?  ?Last Pain:  ?Vitals:  ? 02/17/22 0820  ?TempSrc:   ?PainSc: 0-No pain  ? ? ?  ?  ?  ?  ?  ?  ? ?Nolon Nations ? ? ? ? ?

## 2022-02-17 NOTE — Discharge Instructions (Signed)

## 2022-02-17 NOTE — Interval H&P Note (Signed)
History and Physical Interval Note: ? ?02/17/2022 ?7:46 AM ? ?Charles [name]  has presented today for surgery, with the diagnosis of AFIB.  The various methods of treatment have been discussed with the patient and family. After consideration of risks, benefits and other options for treatment, the patient has consented to  Procedure(s): ?CARDIOVERSION (N/A) as a surgical intervention.  The patient's history has been reviewed, patient examined, no change in status, stable for surgery.  I have reviewed the patient's chart and labs.  Questions were answered to the patient's satisfaction.   ? ? ?[name] ? ? ?

## 2022-02-17 NOTE — Telephone Encounter (Signed)
Prescription refill request for Xarelto received.  ?Indication:Afib ?Last office visit:2/23 ?Weight:131.5 kg ?Age:76 ?Scr:1.4 ?CrCl:84.8 ml/min ? ?Prescription refilled ? ?

## 2022-02-18 ENCOUNTER — Encounter (HOSPITAL_COMMUNITY): Payer: Self-pay | Admitting: Internal Medicine

## 2022-02-24 ENCOUNTER — Ambulatory Visit (HOSPITAL_COMMUNITY)
Admission: RE | Admit: 2022-02-24 | Discharge: 2022-02-24 | Disposition: A | Discharge status: Home / Self Care | Payer: Medicare Other | Source: Ambulatory Visit | Attending: Physician Assistant | Admitting: Physician Assistant

## 2022-02-24 ENCOUNTER — Encounter (HOSPITAL_COMMUNITY): Payer: Self-pay | Admitting: Physician Assistant

## 2022-02-24 VITALS — BP 132/74 | HR 74 | Ht 69.0 in | Wt 288.8 lb

## 2022-02-24 DIAGNOSIS — G4733 Obstructive sleep apnea (adult) (pediatric): Secondary | ICD-10-CM | POA: Diagnosis not present

## 2022-02-24 DIAGNOSIS — E1122 Type 2 diabetes mellitus with diabetic chronic kidney disease: Secondary | ICD-10-CM | POA: Diagnosis not present

## 2022-02-24 DIAGNOSIS — I495 Sick sinus syndrome: Secondary | ICD-10-CM | POA: Insufficient documentation

## 2022-02-24 DIAGNOSIS — I5042 Chronic combined systolic (congestive) and diastolic (congestive) heart failure: Secondary | ICD-10-CM | POA: Diagnosis not present

## 2022-02-24 DIAGNOSIS — Z7901 Long term (current) use of anticoagulants: Secondary | ICD-10-CM | POA: Insufficient documentation

## 2022-02-24 DIAGNOSIS — Z79899 Other long term (current) drug therapy: Secondary | ICD-10-CM | POA: Diagnosis not present

## 2022-02-24 DIAGNOSIS — Z6841 Body Mass Index (BMI) 40.0 and over, adult: Secondary | ICD-10-CM | POA: Diagnosis not present

## 2022-02-24 DIAGNOSIS — D6869 Other thrombophilia: Secondary | ICD-10-CM | POA: Diagnosis not present

## 2022-02-24 DIAGNOSIS — Z950 Presence of cardiac pacemaker: Secondary | ICD-10-CM | POA: Diagnosis not present

## 2022-02-24 DIAGNOSIS — I130 Hypertensive heart and chronic kidney disease with heart failure and stage 1 through stage 4 chronic kidney disease, or unspecified chronic kidney disease: Secondary | ICD-10-CM | POA: Diagnosis not present

## 2022-02-24 DIAGNOSIS — N189 Chronic kidney disease, unspecified: Secondary | ICD-10-CM | POA: Diagnosis not present

## 2022-02-24 DIAGNOSIS — Z951 Presence of aortocoronary bypass graft: Secondary | ICD-10-CM | POA: Insufficient documentation

## 2022-02-24 DIAGNOSIS — I2510 Atherosclerotic heart disease of native coronary artery without angina pectoris: Secondary | ICD-10-CM | POA: Insufficient documentation

## 2022-02-24 DIAGNOSIS — E669 Obesity, unspecified: Secondary | ICD-10-CM | POA: Diagnosis not present

## 2022-02-24 DIAGNOSIS — D6859 Other primary thrombophilia: Secondary | ICD-10-CM | POA: Diagnosis not present

## 2022-02-24 DIAGNOSIS — I4819 Other persistent atrial fibrillation: Secondary | ICD-10-CM | POA: Insufficient documentation
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
## 2022-02-25 ENCOUNTER — Telehealth: Payer: Self-pay | Admitting: Cardiovascular Disease

## 2022-02-25 NOTE — Telephone Encounter (Signed)
Contacted patient, aware of results.  ?Patient verbalized understanding. ? ?

## 2022-02-25 NOTE — Telephone Encounter (Signed)
Patient returned call for his echo results 

## 2022-02-26 NOTE — Progress Notes (Signed)
Remote pacemaker transmission.   

## 2022-03-01 ENCOUNTER — Ambulatory Visit (INDEPENDENT_AMBULATORY_CARE_PROVIDER_SITE_OTHER): Payer: Medicare Other

## 2022-03-01 DIAGNOSIS — I5022 Chronic systolic (congestive) heart failure: Secondary | ICD-10-CM

## 2022-03-01 DIAGNOSIS — Z950 Presence of cardiac pacemaker: Secondary | ICD-10-CM

## 2022-03-02 NOTE — Progress Notes (Signed)
EPIC Encounter for ICM Monitoring ? ?Patient Name: Charles [name] is a 76 y.o. male ?Date: 03/02/2022 ?Primary Care Physican: Cyndi Bender, PA-C ?Primary Cardiologist: Claiborne Billings ?Electrophysiologist: [name] ?Bi-V Pacing:  98%  since 02/17/2022      ?11/27/2020 Weight: 286.4 lbs  ?  ?AT/AF Burden: 4.6% since 02/17/2022 ?                                                         ?Attempted call to patient and unable to reach.  Left detailed message per DPR regarding transmission. Transmission reviewed.  Cardioversion completed 3/29.  ?  ?CorVue thoracic impedance changed from possible fluid accumulation to possible dryness on 4/5.  AT/AF Burden decreased since cardioversion ?  ?Prescribed: ?Furosemide 40 mg take 0.5 tablet (20 mg total) as needed ?Xarelto 20 mg take 1 tablet at supper daily ?  ?Recommendations:  Left voice mail encouraged to increase fluid intake today and left ICM number for return call. ?  ?Follow-up plan: ICM clinic phone appointment on 03/08/2022 to recheck fluid levels.   91 day device clinic remote transmission 05/17/2022. ?  ?EP/Cardiology Office Visits:  Recall 11/15/2022 with Oda Kilts, PA.  Recall 07/11/2022 with Dr Claiborne Billings. ?  ?Copy of ICM check sent to Dr. [name].  ? ?3 month ICM trend: 03/01/2022. ? ? ? ?12-14 Month ICM trend:  ? ? ? ?Rosalene Billings, RN ?03/02/2022 ?12:18 PM ? ?

## 2022-03-08 ENCOUNTER — Ambulatory Visit (INDEPENDENT_AMBULATORY_CARE_PROVIDER_SITE_OTHER): Payer: Medicare Other

## 2022-03-08 DIAGNOSIS — Z950 Presence of cardiac pacemaker: Secondary | ICD-10-CM

## 2022-03-08 DIAGNOSIS — I5022 Chronic systolic (congestive) heart failure: Secondary | ICD-10-CM

## 2022-03-08 NOTE — Progress Notes (Signed)
EPIC Encounter for ICM Monitoring ? ?Patient Name: Charles [name] is a 76 y.o. male ?Date: 03/08/2022 ?Primary Care Physican: Cyndi Bender, PA-C ?Primary Cardiologist: Claiborne [name] ?Electrophysiologist: [name] ?Bi-V Pacing:  98%  since 02/17/2022      ?11/27/2020 Weight: 286.4 lbs  ?  ?AT/AF Burden: 2.9% since 02/17/2022 ?                                                         ?Attempted call to patient and unable to reach.  Left detailed message per DPR regarding transmission. Transmission reviewed. Cardioversion completed 3/29.  ?  ?CorVue thoracic impedance continues to suggesting possible dryness starting 4/5.  AT/AF Burden decreased since cardioversion ?  ?Prescribed: ?Furosemide 40 mg take 0.5 tablet (20 mg total) as needed ?Xarelto 20 mg take 1 tablet at supper daily ?  ?Recommendations:  Left voice mail with ICM number and encouraged to call if experiencing any fluid symptoms. ?  ?Follow-up plan: ICM clinic phone appointment on 03/15/2022 to recheck fluid levels.   91 day device clinic remote transmission 05/17/2022. ?  ?EP/Cardiology Office Visits:  Recall 11/15/2022 with Oda Kilts, PA.  Recall 07/11/2022 with Dr Claiborne [name]. ?  ?Copy of ICM check sent to Dr. [name]. ? ?3 month ICM trend: 03/08/2022. ? ? ? ?12-14 Month ICM trend:  ? ? ? ?Charles Billings, RN ?03/08/2022 ?8:14 AM ? ?

## 2022-03-09 ENCOUNTER — Telehealth: Payer: Self-pay

## 2022-03-09 NOTE — Telephone Encounter (Signed)
Remote ICM transmission received.  Attempted call to patient regarding ICM remote transmission and left detailed message per DPR.  Advised to return call for any fluid symptoms or questions. Next ICM remote transmission scheduled 03/15/2022.   ? ?

## 2022-03-11 ENCOUNTER — Telehealth: Payer: Self-pay | Admitting: Internal Medicine

## 2022-03-11 NOTE — Telephone Encounter (Signed)
patient is requesting to speak with you or Dr. Debara Pickett about his cardioversion procedure ?

## 2022-03-11 NOTE — Telephone Encounter (Signed)
Transmission received no AF since cardioversion, fluid levels ok, auto threshold is off on leads have contacted [employer] rep Bryan Small to check device at apt with Emelia Loron NP tomorrow  ?

## 2022-03-11 NOTE — Telephone Encounter (Addendum)
Patient reports that this past Saturday felt palpitations around 10 pm. He has "not felt good since." He has "lost endurance and feels lightheaded and dizzy." BP 139/79, P 72. After going upstairs to do a device download, he became "short of breath" (audible over phone) and "lightheaded and unsteady on feet. Had him sit down to do the download. He went back downstairs and"got his breath back". Someone is at home with him. Spoke with Dr. Percival Spanish (DOD) who recommends patient be seen in clinic. Appointment made with Brunetta Genera, [place] 4/21. ?

## 2022-03-12 ENCOUNTER — Encounter (HOSPITAL_BASED_OUTPATIENT_CLINIC_OR_DEPARTMENT_OTHER): Payer: Self-pay | Admitting: Family

## 2022-03-12 ENCOUNTER — Ambulatory Visit (INDEPENDENT_AMBULATORY_CARE_PROVIDER_SITE_OTHER): Payer: Medicare Other | Admitting: Family

## 2022-03-12 VITALS — BP 122/74 | HR 70 | Ht 69.0 in | Wt 288.4 lb

## 2022-03-12 DIAGNOSIS — R5383 Other fatigue: Secondary | ICD-10-CM

## 2022-03-12 DIAGNOSIS — E785 Hyperlipidemia, unspecified: Secondary | ICD-10-CM

## 2022-03-12 DIAGNOSIS — I4819 Other persistent atrial fibrillation: Secondary | ICD-10-CM | POA: Diagnosis not present

## 2022-03-12 DIAGNOSIS — I5022 Chronic systolic (congestive) heart failure: Secondary | ICD-10-CM

## 2022-03-12 DIAGNOSIS — I25118 Atherosclerotic heart disease of native coronary artery with other forms of angina pectoris: Secondary | ICD-10-CM

## 2022-03-12 DIAGNOSIS — D6859 Other primary thrombophilia: Secondary | ICD-10-CM
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
## 2022-03-12 NOTE — Patient Instructions (Signed)
Medication Instructions:  ?Continue your current medications.  ? ?*If you need a refill on your cardiac medications before your next appointment, please call your pharmacy* ? ? ?Lab Work: ?Your physician recommends that you return for lab work today: CMP, direct LDL, CBC, thyroid panel ?If you have labs (blood work) drawn today and your tests are completely normal, you will receive your results only by: ?MyChart Message (if you have MyChart) OR ?A paper copy in the mail ?If you have any lab test that is abnormal or we need to change your treatment, we will call you to review the results. ? ? ?Testing/Procedures: ?Your device is working well! ? ?Follow-Up: ?At [facility], you and your health needs are our priority.  As part of our continuing mission to provide you with exceptional heart care, we have created designated Provider Care Teams.  These Care Teams include your primary Cardiologist (physician) and Advanced Practice Providers (APPs -  Physician Assistants and Nurse Practitioners) who all work together to provide you with the care you need, when you need it. ? ?We recommend signing up for the patient portal called "MyChart".  Sign up information is provided on this After Visit Summary.  MyChart is used to connect with patients for Virtual Visits (Telemedicine).  Patients are able to view lab/test results, encounter notes, upcoming appointments, etc.  Non-urgent messages can be sent to your provider as well.   ?To learn more about what you can do with MyChart, go to [email or web address].   ? ?Your next appointment:   ?2-3 month(s) ? ?The format for your next appointment:   ?In Person ? ?Provider:   ?Shelva Majestic, MD or Loel Dubonnet, NP   ? ? ?Other Instructions ? ?If you are interested in participating in cardiac rehab at [facility], let us know and we will place referral.  ? ?Recommend weighing daily and keeping a log. Please call our office if you have weight gain of 2 pounds overnight or 5  pounds in 1 week. This is a day to take your as needed Lasix. ? ?Date ? Time Weight  ? ?    ? ?    ? ?    ? ?    ? ?    ? ?    ? ?    ? ?    ?  ? ?Important Information About Sugar ? ? ? ? ?  ?

## 2022-03-13 LAB — COMPREHENSIVE METABOLIC PANEL
ALT: 16 IU/L (ref 0–44)
AST: 12 IU/L (ref 0–40)
Albumin/Globulin Ratio: 1.4 (ref 1.2–2.2)
Albumin: 4.2 g/dL (ref 3.7–4.7)
Alkaline Phosphatase: 78 IU/L (ref 44–121)
BUN/Creatinine Ratio: 17 (ref 10–24)
BUN: 21 mg/dL (ref 8–27)
Bilirubin Total: 0.3 mg/dL (ref 0.0–1.2)
CO2: 21 mmol/L (ref 20–29)
Calcium: 8.9 mg/dL (ref 8.6–10.2)
Chloride: 105 mmol/L (ref 96–106)
Creatinine, Ser: 1.22 mg/dL (ref 0.76–1.27)
Globulin, Total: 3 g/dL (ref 1.5–4.5)
Glucose: 223 mg/dL — ABNORMAL HIGH (ref 70–99)
Potassium: 4.6 mmol/L (ref 3.5–5.2)
Sodium: 141 mmol/L (ref 134–144)
Total Protein: 7.2 g/dL (ref 6.0–8.5)
eGFR: 62 mL/min/{1.73_m2} (ref >59)

## 2022-03-13 LAB — THYROID PANEL WITH TSH
Free Thyroxine Index: 1.7 (ref 1.2–4.9)
T3 Uptake Ratio: 30 % (ref 24–39)
T4, Total: 5.6 ug/dL (ref 4.5–12.0)
TSH: 1.93 u[IU]/mL (ref 0.450–4.500)

## 2022-03-13 LAB — CBC
Hematocrit: 45.4 % (ref 37.5–51.0)
Hemoglobin: 14.9 g/dL (ref 13.0–17.7)
MCH: 30 pg (ref 26.6–33.0)
MCHC: 32.8 g/dL (ref 31.5–35.7)
MCV: 91 fL (ref 79–97)
Platelets: 223 10*3/uL (ref 150–450)
RBC: 4.97 x10E6/uL (ref 4.14–5.80)
RDW: 13.1 % (ref 11.6–15.4)
WBC: 6.9 10*3/uL (ref 3.4–10.8)

## 2022-03-13 LAB — LDL CHOLESTEROL, DIRECT: LDL Direct: 108 mg/dL — ABNORMAL HIGH (ref 0–99)

## 2022-03-14 ENCOUNTER — Encounter (HOSPITAL_BASED_OUTPATIENT_CLINIC_OR_DEPARTMENT_OTHER): Payer: Self-pay | Admitting: Family

## 2022-03-14 NOTE — Telephone Encounter (Signed)
ok 

## 2022-03-15 ENCOUNTER — Ambulatory Visit (INDEPENDENT_AMBULATORY_CARE_PROVIDER_SITE_OTHER): Payer: Medicare Other

## 2022-03-15 ENCOUNTER — Telehealth (HOSPITAL_BASED_OUTPATIENT_CLINIC_OR_DEPARTMENT_OTHER): Payer: Self-pay

## 2022-03-15 DIAGNOSIS — Z950 Presence of cardiac pacemaker: Secondary | ICD-10-CM

## 2022-03-15 DIAGNOSIS — I5022 Chronic systolic (congestive) heart failure: Secondary | ICD-10-CM | POA: Diagnosis not present

## 2022-03-15 DIAGNOSIS — E785 Hyperlipidemia, unspecified: Secondary | ICD-10-CM

## 2022-03-15 MED ORDER — SPIRONOLACTONE 25 MG PO TABS: 12.5000 mg | ORAL_TABLET | Freq: Every day | ORAL | 3 refills | Status: DC

## 2022-03-15 NOTE — Telephone Encounter (Addendum)
Results called to patient who verbalizes understanding!  ? ?Labs ordered and mailed, prescriptions updated and sent to pharmacy on file  ? ?Patient does not take Rosuvastatin to muscle pain and cramps. Referred to lipid clinic according to recommendations below, NP notified.  ? ? ?----- Message from Loel Dubonnet, NP sent at 03/14/2022  4:49 PM EDT ----- ?Normal kidney, liver, electrolytes, thyroid. CBC with no evidence of anemia nor infection.   ? ?Direct LDL (bad cholesterol) above goal. Please ensure taking rosuvastatin '5mg'$  daily. If taking, refer to lipid clinic. If not, resume and repeat labs in 2 mos.  ? ?No abnormalities by labs that would cause fatigue. Given recent echo with reduced heart pumping function, start Spironolactone 12.'5mg'$  QD for optimization of heart failure therapy with repeat BMP in 2 weeks.  ?

## 2022-03-17 ENCOUNTER — Telehealth: Payer: Self-pay | Admitting: Cardiovascular Disease

## 2022-03-17 ENCOUNTER — Telehealth: Payer: Self-pay

## 2022-03-17 NOTE — Telephone Encounter (Signed)
Patietn advised of device download results. He stated he is not as dizzy. At 11:48 am 152/82, P 92; 1 pm 66/48, P 75; 1:15 129/67, P 70. Stated the 152/82 was after going up stairs. Patient teaching on taking bp 1-2 hours after bp meds, rest for five minutes and sit in chair with feet flat on floor, and place cuff 1 inch above bend in elbow. Patient then said he uses a wrist cuff. Encouraged him to obtain an arm cuff and send in readings from that. ?

## 2022-03-17 NOTE — Telephone Encounter (Signed)
-----   Message from Simone Curia, RN sent at 03/17/2022 11:46 AM EDT ----- ?Regarding: RE: device download ?Normal device function. No episodes noted on device. ? ?Thanks, ?Marliss Czar, RN ?----- Message ----- ?From: Kathreen Devoid, RN ?Sent: 03/17/2022  11:43 AM EDT ?To: Kathreen Devoid, RN, [facility] ?Subject: device download                               ? ?Patient is doing a Engineer, manufacturing. Feeling dizzy. SPB 85-92, DBP 47, P 70s ? ? ?

## 2022-03-17 NOTE — Telephone Encounter (Signed)
LMTCB.  ?Inform patient about device download. ?

## 2022-03-17 NOTE — Progress Notes (Signed)
EPIC Encounter for ICM Monitoring ? ?Patient Name: Charles [name] is a 76 y.o. male ?Date: 03/17/2022 ?Primary Care Physican: Cyndi Bender, PA-C ?Primary Cardiologist: Claiborne Billings ?Electrophysiologist: [name] ?Bi-V Pacing:  98%        ?11/27/2020 Weight: 286.4 lbs  ?03/17/2022 Weight: 281 lbs ?  ?AT/AF Burden: 2.2% since Feb 17, 2022 ?                                                         ?Spoke with patient and he continues to feels some lightheadedness and fatigue since 4/21 OV.  He listed BP readings taken today after he took his BP medication this AM.  He is not taking any PRN Furosemide and drinking 80-100 oz fluid daily.    ?All BP readings taken today 4/26:   ?85/47 ?90/54 HR 72 ?85/47 HR 72 lightheaded ?90/54 ?89/52 HR 89 ?97/48 HR 70 ?92/45 HR 73 ?107/42 HR 74 ?  ?Diet: Drinking > 80 -100 oz daily ? ?03/15/2022 CorVue thoracic impedance suggesting possible dryness from 4/4-4/13, 4/14-4/18, 4/19 and 4/22. ?  ?Prescribed: ?Furosemide 20 mg take 0.5 tablet (10 mg total) as needed ?Spironolactone  ?Farxiga 50 mg take 1 tablet daily ?Xarelto 20 mg take 1 tablet at supper daily ?  ?Labs: ?03/12/2022 Creatinine 1.22, BUN 21, Potassium 4.6, Sodium 141, GFR 62 ?02/11/2022 Creatinine 1.40, BUN 21, Potassium 4.3, Sodium 141 ?A complete set of results can be found in Results Review. ? ?Recommendations:  Advised to continue with fluid intake at this time.  Copy sent to Terie Purser, NP since she saw patient on 4/21 for symptoms.  ?  ?Follow-up plan: ICM clinic phone appointment on 03/29/2022 to recheck fluid levels for dryness.   91 day device clinic remote transmission 05/17/2022. ?  ?EP/Cardiology Office Visits:   04/23/2022 with Terie Purser NP.  Recall 11/15/2022 with Oda Kilts, PA.  Recall 07/11/2022 with Dr Claiborne Billings. ?  ?Copy of ICM check sent to Dr. [name]. ? ?3 month ICM trend: 03/15/2022. ? ? ? ?12-14 Month ICM trend:  ? ? ? ?Rosalene Billings, RN ?03/17/2022 ?10:59 AM ? ?

## 2022-03-17 NOTE — Telephone Encounter (Signed)
Patient requesting that I mail the new orders for entresto and BP to his home. Letter mailed out. ?

## 2022-03-17 NOTE — Telephone Encounter (Addendum)
Informed patient of the following orders from C. Gilford Rile, NP.Marland KitchenMarland Kitchen"Recommend him holding [employer]. Tomorrow reduce Entresto to half tablet twice daily.  ?Report if SBP consistently <110  ?Laurann Montana, NP" ?Patient repeated the above in his own words, voicing understanding.  ?

## 2022-03-17 NOTE — Telephone Encounter (Signed)
Pt c/o BP issue: STAT if pt c/o blurred vision, one-sided weakness or slurred speech ? ?1. What are your last 5 BP readings?  ?03/18/22 9:30 AM 85/47 HR 72 10:00 AM 90/54 HR 89 right after 10:00 AM 99/52 HR 70 10:15 AM 97/48 HR 70 10:24 AM 92/47 HR 71 10:45 AM 92/45 HR 73 ? ?2. Are you having any other symptoms (ex. Dizziness, headache, blurred vision, passed out)? Tired and lightheadedness  ? ?3. What is your BP issue? Hypotension that started this morning.   ?

## 2022-03-17 NOTE — Telephone Encounter (Signed)
Patient stated that today his SBP is 85-92, DBP 47 with pulse in the 70s. He is feeling light headed and dizzy. Did a Engineer, manufacturing. Stays hydrated (84 ounces a day), voiding "normally". Denies chest pain or palpitations. Recommended that he go to the ed if bp goes down any further or dizziness increases. No current changes in medications. Please advise. ?

## 2022-03-18 ENCOUNTER — Other Ambulatory Visit: Payer: Self-pay

## 2022-03-18 MED ORDER — ENTRESTO 49-51 MG PO TABS: 0.5000 | ORAL_TABLET | Freq: Two times a day (BID) | ORAL | 1 refills | Status: DC

## 2022-03-18 MED ORDER — ENTRESTO 24-26 MG PO TABS: 1.0000 | ORAL_TABLET | Freq: Two times a day (BID) | ORAL | 11 refills | Status: DC

## 2022-03-18 NOTE — Progress Notes (Signed)
Thank you for sending! I have reduced his Entresto on separate phone encounter so hopeful that will help with the dryness, hypotension ? ?Loel Dubonnet, NP  ?

## 2022-03-18 NOTE — Telephone Encounter (Signed)
If sending refills - can send Entresto 24-'26mg'$  one tablet daily. He can use up his 49-'51mg'$  tablets by taking half tablet twice daily. ? ?Loel Dubonnet, NP  ?

## 2022-03-18 NOTE — Telephone Encounter (Signed)
Sorry - Delene Loll 24-'26mg'$  one tablet TWICE daily. ? ?Thank you!! ?Loel Dubonnet, NP  ?

## 2022-03-18 NOTE — Progress Notes (Signed)
Attempted call to patient and unable to reach.  Left message regarding update from Terie Purser, NP.  Advise if still symptomatic with low BP next week to contact Caitlyn's office for further recommendations.  Will recheck fluid levels on 5/8.  ?

## 2022-03-29 ENCOUNTER — Ambulatory Visit (INDEPENDENT_AMBULATORY_CARE_PROVIDER_SITE_OTHER): Payer: Medicare Other

## 2022-03-29 DIAGNOSIS — I5022 Chronic systolic (congestive) heart failure: Secondary | ICD-10-CM

## 2022-03-29 DIAGNOSIS — Z950 Presence of cardiac pacemaker: Secondary | ICD-10-CM

## 2022-03-29 NOTE — Progress Notes (Signed)
EPIC Encounter for ICM Monitoring ? ?Patient Name: Charles [name] is a 76 y.o. male ?Date: 03/29/2022 ?Primary Care Physican: Cyndi Bender, PA-C ?Primary Cardiologist: Claiborne Billings ?Electrophysiologist: [name] ?Bi-V Pacing:  97%        ?11/27/2020 Weight: 286.4 lbs  ?03/17/2022 Weight: 281 lbs ?  ?AT/AF Burden: 1.4% since Feb 17, 2022 ?                                                         ?Attempted call to patient and unable to reach.  Left detailed message per DPR regarding transmission. Transmission reviewed.  Pt working with [employer] office regarding low BP. ?  ?Diet: Drinking > 80 -100 oz daily ?  ?CorVue thoracic impedance suggesting possible fluid accumulation starting 5/5. ?  ?Prescribed: ?Furosemide 20 mg take 0.5 tablet (10 mg total) as needed ?Spironolactone 25 mg take 0.5 tablet (12.5 mg total) my mouth daily ?Farxiga 50 mg take 1 tablet daily ?Xarelto 20 mg take 1 tablet at supper daily ?  ?Labs: ?03/12/2022 Creatinine 1.22, BUN 21, Potassium 4.6, Sodium 141, GFR 62 ?02/11/2022 Creatinine 1.40, BUN 21, Potassium 4.3, Sodium 141 ?A complete set of results can be found in Results Review. ?  ?Recommendations:  Left voice mail with ICM number and encouraged to call if experiencing any fluid symptoms. ?  ?Follow-up plan: ICM clinic phone appointment on 04/05/2022 to recheck fluid levels.   91 day device clinic remote transmission 05/17/2022. ?  ?EP/Cardiology Office Visits:   04/23/2022 with Terie Purser NP.  Recall 11/15/2022 with Oda Kilts, PA.  Recall 07/11/2022 with Dr Claiborne Billings. ?  ?Copy of ICM check sent to Dr. [name]. ? ?3 month ICM trend: 03/29/2022. ? ? ? ?12-14 Month ICM trend:  ? ? ? ?Rosalene Billings, RN ?03/29/2022 ?8:58 AM ? ?

## 2022-03-30 ENCOUNTER — Telehealth: Payer: Self-pay

## 2022-03-30 NOTE — Telephone Encounter (Signed)
Remote ICM transmission received.  Attempted call to patient regarding ICM remote transmission and left detailed message per DPR.  Advised to return call for any fluid symptoms or questions. Next ICM remote transmission scheduled 04/05/2022.   ? ?

## 2022-03-31 ENCOUNTER — Other Ambulatory Visit: Payer: Self-pay | Admitting: Endocrinology

## 2022-04-01 ENCOUNTER — Telehealth (HOSPITAL_BASED_OUTPATIENT_CLINIC_OR_DEPARTMENT_OTHER): Payer: Self-pay | Admitting: Family

## 2022-04-01 ENCOUNTER — Other Ambulatory Visit: Payer: Self-pay

## 2022-04-01 DIAGNOSIS — I5022 Chronic systolic (congestive) heart failure: Secondary | ICD-10-CM

## 2022-04-01 NOTE — Telephone Encounter (Signed)
Spoke with patient. He reports he has the following symptoms since starting Spironolactone on 4/28:  ?Nausea ?Lightheadedness and feeling like he is going to faint ?Balance issues ?No endurance ?Brain fog ?Extreme pain around knee caps and back.  Having difficulty walking due to pain ?SOB ?Weight gain of 13 lbs within last 10 days---weight was 277 lbs and 290 lbs today.  ? ?He has not taken any PRN Furosemide and advised to take prescribed dosage of 10 mg daily x 4 days.   Advised to limit fluid intake to 64 oz daily and salt intake. ? ?Copy sent to Dr Evette Georges office for further evaluation and recommendations.  Advised patient I will not be in the office tomorrow and he should call Dr Evette Georges office directly if needed or use local ER.   ? ?04/01/2022 Corvue suggesting possible fluid accumulation starting 5/7.   ? ? ?

## 2022-04-01 NOTE — Telephone Encounter (Signed)
STAT if patient feels like he/she is going to faint  ? ?Are you dizzy now? no ? ?Do you feel faint or have you passed out? no ? ?Do you have any other symptoms? Joint and muscle pain in knees, short term memory loss  ? ?Have you checked your HR and BP (record if available)? Patient said readings were fine . He does not have the readings with him at the time of call  ? ? ?

## 2022-04-01 NOTE — Telephone Encounter (Signed)
Spoke with pt and notes several side effects since starting Spironolactone B/P has been okay  Per pt fluid not coming off like when had taken Lasix Per pt still had prn Lasix and will take for a couple of days and will see if this helps Pt notes belly feeling full Will forward to Owens Shark NP for review and recommendations ./cy ?

## 2022-04-01 NOTE — Telephone Encounter (Signed)
Spoke with the patient who was returning Laurie's call in regards to symptoms.  ?He reports he has had increased joint pain in his knees. He has had to use a walker the past few days. He also reports some SOB when moving around and reports that if he stands up too quickly his vision will fade a bit. ?He keeps up with his BP and HR. He states that they have been normal but does not have any readings on hand to report.  ?

## 2022-04-01 NOTE — Telephone Encounter (Signed)
Spoke with the patient and gave recommendations from Coletta Memos, NP. He will stop taking spironolactone and take lasix 10 mg for 4 days. He will come in for lab work next week.  ?He is currently scheduled to see Urban Gibson back on 5/23 but will let him know if we can find an opening sooner. He will call back if things do not improve and is also aware of ER precautions.  ?

## 2022-04-02 ENCOUNTER — Telehealth (HOSPITAL_BASED_OUTPATIENT_CLINIC_OR_DEPARTMENT_OTHER): Payer: Self-pay

## 2022-04-02 DIAGNOSIS — I5022 Chronic systolic (congestive) heart failure: Secondary | ICD-10-CM

## 2022-04-02 LAB — BASIC METABOLIC PANEL
BUN/Creatinine Ratio: 17 (ref 10–24)
BUN: 23 mg/dL (ref 8–27)
CO2: 17 mmol/L — ABNORMAL LOW (ref 20–29)
Calcium: 8.8 mg/dL (ref 8.6–10.2)
Chloride: 104 mmol/L (ref 96–106)
Creatinine, Ser: 1.34 mg/dL — ABNORMAL HIGH (ref 0.76–1.27)
Glucose: 233 mg/dL — ABNORMAL HIGH (ref 70–99)
Potassium: 4.8 mmol/L (ref 3.5–5.2)
Sodium: 139 mmol/L (ref 134–144)
eGFR: 55 mL/min/{1.73_m2} — ABNORMAL LOW (ref >59)

## 2022-04-02 NOTE — Telephone Encounter (Addendum)
Results called to patient who verbalizes understanding!  ? ? ? ?----- Message from Deberah Pelton, NP sent at 04/02/2022  7:06 AM EDT ----- ?Please contact Mr. Dollar and let him know that his lab work from yesterday has been reviewed.  His creatinine was slightly elevated at 1.34.  This should improve with the discontinuation of his spironolactone.  His glucose is elevated at 233.  Please ask him to avoid simple carbohydrate foods (white bread, white rice, pasta, sweets, sugars, EtOH etc.)  We will repeat his BMP in 2 weeks.  Thank you. ?

## 2022-04-05 ENCOUNTER — Ambulatory Visit (INDEPENDENT_AMBULATORY_CARE_PROVIDER_SITE_OTHER): Payer: Medicare Other

## 2022-04-05 DIAGNOSIS — I5022 Chronic systolic (congestive) heart failure: Secondary | ICD-10-CM

## 2022-04-05 DIAGNOSIS — Z950 Presence of cardiac pacemaker: Secondary | ICD-10-CM

## 2022-04-06 ENCOUNTER — Other Ambulatory Visit: Payer: Self-pay

## 2022-04-06 ENCOUNTER — Emergency Department (HOSPITAL_BASED_OUTPATIENT_CLINIC_OR_DEPARTMENT_OTHER): Payer: Medicare Other

## 2022-04-06 ENCOUNTER — Emergency Department (HOSPITAL_BASED_OUTPATIENT_CLINIC_OR_DEPARTMENT_OTHER)
Admission: EM | Admit: 2022-04-06 | Discharge: 2022-04-06 | Disposition: A | Discharge status: Home / Self Care | Payer: Medicare Other | Attending: Emergency Medicine | Admitting: Emergency Medicine

## 2022-04-06 ENCOUNTER — Encounter (HOSPITAL_BASED_OUTPATIENT_CLINIC_OR_DEPARTMENT_OTHER): Payer: Self-pay

## 2022-04-06 DIAGNOSIS — N189 Chronic kidney disease, unspecified: Secondary | ICD-10-CM | POA: Insufficient documentation

## 2022-04-06 DIAGNOSIS — Z91040 Latex allergy status: Secondary | ICD-10-CM | POA: Diagnosis not present

## 2022-04-06 DIAGNOSIS — Z794 Long term (current) use of insulin: Secondary | ICD-10-CM | POA: Insufficient documentation

## 2022-04-06 DIAGNOSIS — R0602 Shortness of breath: Secondary | ICD-10-CM | POA: Diagnosis present

## 2022-04-06 DIAGNOSIS — I130 Hypertensive heart and chronic kidney disease with heart failure and stage 1 through stage 4 chronic kidney disease, or unspecified chronic kidney disease: Secondary | ICD-10-CM | POA: Diagnosis not present

## 2022-04-06 DIAGNOSIS — I5042 Chronic combined systolic (congestive) and diastolic (congestive) heart failure: Secondary | ICD-10-CM | POA: Insufficient documentation

## 2022-04-06 DIAGNOSIS — I480 Paroxysmal atrial fibrillation: Secondary | ICD-10-CM | POA: Insufficient documentation

## 2022-04-06 DIAGNOSIS — Z79899 Other long term (current) drug therapy: Secondary | ICD-10-CM | POA: Diagnosis not present

## 2022-04-06 DIAGNOSIS — R0600 Dyspnea, unspecified: Secondary | ICD-10-CM

## 2022-04-06 DIAGNOSIS — Z7982 Long term (current) use of aspirin: Secondary | ICD-10-CM | POA: Insufficient documentation

## 2022-04-06 DIAGNOSIS — Z7901 Long term (current) use of anticoagulants: Secondary | ICD-10-CM | POA: Diagnosis not present

## 2022-04-06 DIAGNOSIS — Z7984 Long term (current) use of oral hypoglycemic drugs: Secondary | ICD-10-CM | POA: Diagnosis not present

## 2022-04-06 DIAGNOSIS — R42 Dizziness and giddiness: Secondary | ICD-10-CM | POA: Insufficient documentation

## 2022-04-06 DIAGNOSIS — E1122 Type 2 diabetes mellitus with diabetic chronic kidney disease: Secondary | ICD-10-CM | POA: Diagnosis not present

## 2022-04-06 LAB — BASIC METABOLIC PANEL
BUN/Creatinine Ratio: 21 (ref 10–24)
BUN: 32 mg/dL — ABNORMAL HIGH (ref 8–27)
CO2: 20 mmol/L (ref 20–29)
Calcium: 8.8 mg/dL (ref 8.6–10.2)
Chloride: 104 mmol/L (ref 96–106)
Creatinine, Ser: 1.54 mg/dL — ABNORMAL HIGH (ref 0.76–1.27)
Glucose: 154 mg/dL — ABNORMAL HIGH (ref 70–99)
Potassium: 4.2 mmol/L (ref 3.5–5.2)
Sodium: 140 mmol/L (ref 134–144)
eGFR: 47 mL/min/{1.73_m2} — ABNORMAL LOW (ref >59)

## 2022-04-06 LAB — CBC
HCT: 44.8 % (ref 39.0–52.0)
Hemoglobin: 14.2 g/dL (ref 13.0–17.0)
MCH: 29.5 pg (ref 26.0–34.0)
MCHC: 31.7 g/dL (ref 30.0–36.0)
MCV: 92.9 fL (ref 80.0–100.0)
Platelets: 187 10*3/uL (ref 150–400)
RBC: 4.82 MIL/uL (ref 4.22–5.81)
RDW: 14 % (ref 11.5–15.5)
WBC: 6.7 10*3/uL (ref 4.0–10.5)
nRBC: 0 % (ref 0.0–0.2)

## 2022-04-06 LAB — COMPREHENSIVE METABOLIC PANEL
ALT: 32 U/L (ref 0–44)
AST: 18 U/L (ref 15–41)
Albumin: 4.2 g/dL (ref 3.5–5.0)
Alkaline Phosphatase: 62 U/L (ref 38–126)
Anion gap: 10 (ref 5–15)
BUN: 34 mg/dL — ABNORMAL HIGH (ref 8–23)
CO2: 25 mmol/L (ref 22–32)
Calcium: 9.2 mg/dL (ref 8.9–10.3)
Chloride: 104 mmol/L (ref 98–111)
Creatinine, Ser: 1.55 mg/dL — ABNORMAL HIGH (ref 0.61–1.24)
GFR, Estimated: 46 mL/min — ABNORMAL LOW (ref >60)
Glucose, Bld: 172 mg/dL — ABNORMAL HIGH (ref 70–99)
Potassium: 4.3 mmol/L (ref 3.5–5.1)
Sodium: 139 mmol/L (ref 135–145)
Total Bilirubin: 0.6 mg/dL (ref 0.3–1.2)
Total Protein: 7.4 g/dL (ref 6.5–8.1)

## 2022-04-06 LAB — BRAIN NATRIURETIC PEPTIDE: B Natriuretic Peptide: 361.5 pg/mL — ABNORMAL HIGH (ref 0.0–100.0)

## 2022-04-06 LAB — TROPONIN I (HIGH SENSITIVITY): Troponin I (High Sensitivity): 13 ng/L (ref <18)

## 2022-04-06 LAB — CBG MONITORING, ED: Glucose-Capillary: 144 mg/dL — ABNORMAL HIGH (ref 70–99)

## 2022-04-06 MED ORDER — LACTATED RINGERS IV BOLUS
500.0000 mL | Freq: Once | INTRAVENOUS | Status: AC
  Administered 2022-04-06: 500 mL via INTRAVENOUS

## 2022-04-06 NOTE — Progress Notes (Signed)
EPIC Encounter for ICM Monitoring ? ?Patient Name: Charles [name] is a 76 y.o. male ?Date: 04/06/2022 ?Primary Care Physican: Cyndi Bender, PA-C ?Primary Cardiologist: Claiborne [name] ?Electrophysiologist: [name] ?Bi-V Pacing:  88% (previously 97% on 5/8%)  ?11/27/2020 Weight: 286.4 lbs  ?03/17/2022 Weight: 281 lbs ?  ?AT/AF Burden: 15 % on 5/16 report.  Was 1.4% on 5/8 report ? ?                                                         ?Spoke with patient and heart failure questions reviewed.  Pt extreme dizziness, SOB, feeling like he is going to pass out after climbing stairs, and nausea.  BP today 98/44, 94/83, 111/53.   Pt has decreased fluid intake to 64 oz daily. ?  ?CorVue thoracic impedance suggesting fluid levels returned to normal.  Message sent to device clinic triage to review AT/AF burden.  Pt had cardioversion 3/29. ?  ?Prescribed: ?Furosemide 20 mg take 0.5 tablet (10 mg total) as needed ?Spironolactone 25 mg take 0.5 tablet (12.5 mg total) my mouth daily ?Farxiga 50 mg take 1 tablet daily ?Xarelto 20 mg take 1 tablet at supper daily ?  ?Labs: ?04/05/2022 Creatinine 1.54, BUN 32, Potassium 4.2, Sodium 140, GFR 47 ?04/01/2022 Creatinine 1.34, BUN 23, Potassium 4.8, Sodium 139, GFR 55 ?03/12/2022 Creatinine 1.22, BUN 21, Potassium 4.6, Sodium 141, GFR 62 ?02/11/2022 Creatinine 1.40, BUN 21, Potassium 4.3, Sodium 141 ?A complete set of results can be found in Results Review. ?  ?Recommendations:  Advised to call afib clinic to report symptoms and advise patient's report is showing AT/AF.  He agreed to call today.  Advised to call 911 if symptoms become urgent. ?  ?Follow-up plan: ICM clinic phone appointment on 04/26/2022.   91 day device clinic remote transmission 05/17/2022. ?  ?EP/Cardiology Office Visits:  04/13/2022 with Terie Purser NP.  Recall 11/15/2022 with Oda Kilts, PA.  Recall 07/11/2022 with Dr Claiborne [name]. ?  ?Copy of ICM check sent to Dr. [name], Dr Claiborne [name] and Terie Purser, NP. ? ?3 month ICM trend:  04/05/2022. ? ? ? ?12-14 Month ICM trend:  ? ? ? ?Charles Billings, RN ?04/06/2022 ?8:55 AM ? ?

## 2022-04-06 NOTE — Progress Notes (Signed)
Message ?Received: Today ?Charlie Pitter, PA-C  Annsley Akkerman Panda, RN; Thompson Grayer, MD; Troy Sine, MD; Loel Dubonnet, NP ?I am covering Caitlin's inbox today. Given severity of symptoms, I would advise patient be seen more urgently today. With his recent labs showing rising Cr, as well as softer BP, and severe dizziness and SOB, needs in-person eval today with labs to exclude other medical cause such as anemia or infection.  ?

## 2022-04-06 NOTE — Progress Notes (Signed)
Device clinic triage message:  ? ?Simone Curia, RN  Johanny Segers Panda, RN ?Good morning,  ? ?Yes, I do agree that he has an increase in AF burden. His rates are controlled so I am not sure why he feels like he is going to pass out. May want to find out more about that.  I dont see anything on device report that would go along with that symptom. Yes, AF clinic is where I would send him back to.  ? ?Thanks for you help!  ? ?Leigh  ?

## 2022-04-06 NOTE — ED Provider Notes (Signed)
?[facility]ERGENC[facility] DEPT ?Provider Note ? ? ?CSN: [id number] ?Arrival date & time: 04/06/22  1309 ? ?  ? ?History ? ?Chief Complaint  ?Patient presents with  ? Shortness of Breath  ? ? ?Charles [name] is a 76 y.o. male. ? ?HPI ? ?76 year old male history of obesity, hyperlipidemia, hypertension, paroxysmal atrial fibrillation, type 2 diabetes, history of NSTEMI with inferior wall myocardial infarction, combined systolic and diastolic congestive heart failure, obstructive sleep apnea on CPAP, CKD, presents today with increased dyspnea, weakness, and lightheadedness.  Patient states that he has been on Lasix for some volume overload.  This was changed to but changed back to Lasix 5 days ago.  He completed 5 days of Lasix.  He had some increased weakness while he was working outside in the yard yesterday.  Today he felt generally weak and states his systolic blood pressure was 90.  He comes in due to increased dyspnea and generalized weakness.  He denies any chest pain or fever he has had some increased cough but has not had fever with this and it is nonproductive.  He states that he was seen and evaluated with labs yesterday.  He reports recent episode of atrial fibrillation and cardioversion.  He is started on blood thinners last night. ?  ? ?Home Medications ?Prior to Admission medications   ?Medication Sig Start Date End Date Taking? Authorizing Provider  ?alfuzosin (UROXATRAL) 10 MG 24 hr tablet Take 10 mg by mouth every evening.     [provider]  ?aspirin EC 81 MG EC tablet Take 1 tablet (81 mg total) by mouth daily. 09/21/16   John Giovanni, PA-C  ?BD INSULIN SYRINGE U/F 30G X 1/2" 0.5 ML MISC USE TO INJECT INSULIN 3 TIMES DAILY 04/07/21   Elayne Snare, MD  ?BD ULTRA-FINE PEN NEEDLES 29G X 12.7MM MISC USE DAILY TO INJECT TRESIBA INSULIN 07/24/21   Elayne Snare, MD  ?carvedilol (COREG) 12.5 MG tablet Take 1 tablet (12.5 mg total) by mouth 2 (two) times daily. 01/12/22   Charles Sine, MD   ?Coenzyme Q10 (COQ-10) 400 MG CAPS Take 400 mg by mouth daily.    [provider]  ?Continuous Blood Gluc Receiver (FREESTYLE LIBRE 14 DAY READER) DEVI USE TO MONITOR BLOOD SUGAR 12/01/20   Elayne Snare, MD  ?Continuous Blood Gluc Sensor (FREESTYLE LIBRE 14 DAY SENSOR) MISC APPLY 1 SENSOR EVERY 14 DAYS AS DIRECTED 10/07/21   Elayne Snare, MD  ?cyanocobalamin 1000 MCG tablet Take 1,000 mcg by mouth daily.    [provider]  ?FARXIGA 5 MG TABS tablet TAKE 1 TABLET BY MOUTH EVERY DAY 10/13/21   Elayne Snare, MD  ?fluticasone (FLONASE) 50 MCG/ACT nasal spray Place 1 spray into both nostrils daily as needed for allergies or rhinitis.    [provider]  ?FREESTYLE PRECISION NEO TEST test strip CHECK SUGAR 3X DAILY 09/30/21   Elayne Snare, MD  ?furosemide (LASIX) 20 MG tablet Take 10 mg by mouth daily as needed for fluid or edema.    [provider]  ?gabapentin (NEURONTIN) 300 MG capsule TAKE 1 CAPSULE BY MOUTH THREE TIMES A DAY 12/10/21   Elayne Snare, MD  ?glucose blood (FREESTYLE PRECISION NEO TEST) test strip Use Freestyle neo test strips as instructed to check blood sugar with libre meter up to three times daily as needed. 05/13/20   Elayne Snare, MD  ?glucose blood (FREESTYLE PRECISION NEO TEST) test strip CHECK SUGAR 3X DAILY 10/13/21   Elayne Snare, MD  ?  meclizine (ANTIVERT) 25 MG tablet Take 25 mg by mouth 3 (three) times daily as needed (vertigo). 01/01/22   [provider]  ?metFORMIN (GLUCOPHAGE-XR) 500 MG 24 hr tablet TAKE 3 TABLETS BY MOUTH EVERY DAY WITH SUPPER 04/01/22   Elayne Snare, MD  ?nitroGLYCERIN (NITROSTAT) 0.4 MG SL tablet Place 1 tablet (0.4 mg total) under the tongue every 5 (five) minutes as needed for chest pain. 07/09/20   Charles Sine, MD  ?NOVOLOG 100 UNIT/ML injection INJECT 15-30 UNITS UNDER THE SKIN THREE TIMES DAILY BEFORE MEALS. ?Patient taking differently: Inject 0-40 Units into the skin 3 (three) times daily with meals. Dose per sliding scale  06/22/21   Elayne Snare, MD  ?oxymetazoline (AFRIN) 0.05 % nasal spray Place 2 sprays into both nostrils 2 (two) times daily as needed for congestion.    [provider]  ?promethazine (PHENERGAN) 25 MG tablet Take 1 tablet by mouth every 8 (eight) hours as needed for nausea. 06/25/19   [provider]  ?sacubitril-valsartan (ENTRESTO) 24-26 MG Take 1 tablet by mouth 2 (two) times daily. 03/18/22   Loel Dubonnet, NP  ?TRESIBA FLEXTOUCH 100 UNIT/ML FlexTouch Pen INJECT 40 UNITS (0.4MLS) INTO THE SKIN DAILY AS DIRECTED 02/22/22   Elayne Snare, MD  ?triamcinolone cream (KENALOG) 0.1 % Apply 1 application. topically 2 (two) times daily as needed (rash). 12/07/21   [provider]  ?Alveda Reasons 20 MG TABS tablet TAKE 1 TABLET BY MOUTH DAILY WITH SUPPER 02/17/22   Charles Sine, MD  ?   ? ?Allergies    ?Black pepper [piper], Chocolate, Codeine, Oxytetracycline, Statins, Latex, and Tape   ? ?Review of Systems   ?Review of Systems ? ?Physical Exam ?Updated Vital Signs ?BP 127/78   Pulse 88   Temp 97.7 ?F (36.5 ?C)   Resp 16   Ht 1.753 m ('5\' 9"'$ )   Wt 129 kg   SpO2 96%   BMI 42.01 kg/m?  ?Physical Exam ? ?ED Results / Procedures / Treatments   ?Labs ?(all labs ordered are listed, but only abnormal results are displayed) ?Labs Reviewed  ?COMPREHENSIVE METABOLIC PANEL - Abnormal; Notable for the following components:  ?    Result Value  ? Glucose, Bld 172 (*)   ? BUN 34 (*)   ? Creatinine, Ser 1.55 (*)   ? GFR, Estimated 46 (*)   ? All other components within normal limits  ?BRAIN NATRIURETIC PEPTIDE - Abnormal; Notable for the following components:  ? B Natriuretic Peptide 361.5 (*)   ? All other components within normal limits  ?CBC  ?TROPONIN I (HIGH SENSITIVITY)  ?TROPONIN I (HIGH SENSITIVITY)  ? ? ?EKG ?EKG Interpretation ? ?Date/Time:  Tuesday Apr 06 2022 13:19:36 EDT ?Ventricular Rate:  86 ?PR Interval:  160 ?QRS Duration: 140 ?QT Interval:  412 ?QTC Calculation: 493 ?R Axis:   -67 ?Text  Interpretation: Sinus rhythm with occasional Premature ventricular complexes and Premature atrial complexes Left axis deviation Non-specific intra-ventricular conduction block Inferior infarct , age undetermined Possible Anterolateral infarct , age undetermined Abnormal ECG When compared with ECG of 24-Feb-2022 09:45, Sinus rhythm has replaced Electronic ventricular pacemaker No significant change from EKG of January 27, 2022 Confirmed by Pattricia Boss [phone]) on 04/06/2022 3:25:06 PM ? ?Radiology ?DG Chest Port 1 View ? ?Result Date: 04/06/2022 ?CLINICAL DATA:  Cough, dyspnea EXAM: PORTABLE CHEST 1 VIEW COMPARISON:  08/15/2019 FINDINGS: Transverse diameter of heart is increased. Pacemaker battery is seen in the left infraclavicular region. Biventricular pacer leads are  noted. There is evidence of previous coronary bypass surgery. Are no signs of alveolar pulmonary edema or focal pulmonary consolidation. There is no pleural effusion or pneumothorax. IMPRESSION: Cardiomegaly. There are no signs of pulmonary edema or focal pulmonary consolidation. Electronically Signed   By: Elmer Picker M.D.   On: 04/06/2022 14:48   ? ?Procedures ?Procedures  ? ? ?Medications Ordered in ED ?Medications  ?lactated ringers bolus 500 mL (500 mLs Intravenous New Bag/Given 04/06/22 1406)  ? ? ?ED Course/ Medical Decision Making/ A&P ?Clinical Course as of 04/06/22 1527  ?Tue Apr 06, 2022  ?1414 CBC reviewed interpreted and within normal limit [DR]  ?0998 Complete metabolic panel reviewed and interpreted and creatinine elevated at 1.55 from prior of 1.3 consistent with volume contraction [DR]  ?1444 CBC ?CBC reviewed interpreted within normal [DR]  ?1445 Troponin is 13 which is stable from first prior [DR]  ?1457 BNP is mildly elevated at 361 ? [DR]  ?1457 Chest x-Keaira Whitehurst reviewed and interpreted with cardiomegaly but no signs of pulmonary edema or focal pulmonary consolidation noted and radiologist interpretation concurs [DR]  ?  ?Clinical  Course User Index ?[DR] Pattricia Boss, MD  ? ?                        ?Medical Decision Making ?76 yo male with known paroxysmal a fib, chf,with recent increased diuresis with spironolactone f/b lasix for past 5 days. Fraser Din

## 2022-04-06 NOTE — ED Triage Notes (Signed)
Pt reports for one week he's been having increasing DOE when performing ADLs. Pt also c/o increasing joint pain affecting this as well. Pt states that he has also been having near syncopal and dizziness episodes. Taken meclizine without relief. Denies CP, palpitations, abdominal pain.  ?

## 2022-04-06 NOTE — Discharge Instructions (Signed)
Please continue your home medications as prescribed ?Return if you are having worsening symptoms especially chest pain or increased shortness of breath ?Please keep your follow-up appointments with cardiology. ?

## 2022-04-06 NOTE — Progress Notes (Signed)
Spoke with patient and and advised of Melina Copa, PA recommendation to be evaluated in the ER today.  Explained given his symptoms and recent rising of Creatinine that he may have an underlying problem other than just being in Afib.  Advised he may go to [facility] ER but if he needs to be admitted he will be transferred to [facility]. He asked if he should take his BP meds today and explained he can inform ER about his meds and low BP so they can evaluate.  Pt has agreed to go to ER.  He an appointment with Afib clinic on 5/18 and advised he should keep that appointment if patient is not admitted today.   He verbalized understanding of recommendation to proceed to ER.   ?

## 2022-04-08 ENCOUNTER — Encounter (HOSPITAL_COMMUNITY): Payer: Self-pay | Admitting: Physician Assistant

## 2022-04-08 ENCOUNTER — Ambulatory Visit (HOSPITAL_COMMUNITY)
Admission: RE | Admit: 2022-04-08 | Discharge: 2022-04-08 | Disposition: A | Discharge status: Home / Self Care | Payer: Medicare Other | Source: Ambulatory Visit | Attending: Physician Assistant | Admitting: Physician Assistant

## 2022-04-08 ENCOUNTER — Encounter (HOSPITAL_COMMUNITY): Payer: Self-pay | Admitting: Cardiovascular Disease

## 2022-04-08 VITALS — BP 98/70 | HR 80 | Ht 69.0 in | Wt 289.0 lb

## 2022-04-08 DIAGNOSIS — I4892 Unspecified atrial flutter: Secondary | ICD-10-CM | POA: Insufficient documentation

## 2022-04-08 DIAGNOSIS — I4819 Other persistent atrial fibrillation: Secondary | ICD-10-CM

## 2022-04-08 DIAGNOSIS — Z951 Presence of aortocoronary bypass graft: Secondary | ICD-10-CM | POA: Diagnosis not present

## 2022-04-08 DIAGNOSIS — N183 Chronic kidney disease, stage 3 unspecified: Secondary | ICD-10-CM | POA: Diagnosis not present

## 2022-04-08 DIAGNOSIS — I5042 Chronic combined systolic (congestive) and diastolic (congestive) heart failure: Secondary | ICD-10-CM | POA: Insufficient documentation

## 2022-04-08 DIAGNOSIS — Z6841 Body Mass Index (BMI) 40.0 and over, adult: Secondary | ICD-10-CM | POA: Insufficient documentation

## 2022-04-08 DIAGNOSIS — Z8679 Personal history of other diseases of the circulatory system: Secondary | ICD-10-CM | POA: Diagnosis not present

## 2022-04-08 DIAGNOSIS — E669 Obesity, unspecified: Secondary | ICD-10-CM | POA: Diagnosis not present

## 2022-04-08 DIAGNOSIS — I252 Old myocardial infarction: Secondary | ICD-10-CM | POA: Diagnosis not present

## 2022-04-08 DIAGNOSIS — R001 Bradycardia, unspecified: Secondary | ICD-10-CM | POA: Insufficient documentation

## 2022-04-08 DIAGNOSIS — Z79899 Other long term (current) drug therapy: Secondary | ICD-10-CM | POA: Diagnosis not present

## 2022-04-08 DIAGNOSIS — I2510 Atherosclerotic heart disease of native coronary artery without angina pectoris: Secondary | ICD-10-CM | POA: Diagnosis not present

## 2022-04-08 DIAGNOSIS — G4733 Obstructive sleep apnea (adult) (pediatric): Secondary | ICD-10-CM | POA: Diagnosis not present

## 2022-04-08 DIAGNOSIS — I130 Hypertensive heart and chronic kidney disease with heart failure and stage 1 through stage 4 chronic kidney disease, or unspecified chronic kidney disease: Secondary | ICD-10-CM | POA: Insufficient documentation

## 2022-04-08 DIAGNOSIS — D6869 Other thrombophilia: Secondary | ICD-10-CM | POA: Insufficient documentation

## 2022-04-08 DIAGNOSIS — Z7901 Long term (current) use of anticoagulants: Secondary | ICD-10-CM | POA: Diagnosis not present

## 2022-04-08 DIAGNOSIS — E1122 Type 2 diabetes mellitus with diabetic chronic kidney disease: Secondary | ICD-10-CM | POA: Diagnosis not present
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
## 2022-04-08 NOTE — Patient Instructions (Addendum)
Cardioversion scheduled for Friday, June 2nd  - Arrive at the [facility] and go to admitting at [facility] not eat or drink anything after midnight the night prior to your procedure.  - Take all your morning medication (except diabetic medications) with a sip of water prior to arrival.  - You will not be able to drive home after your procedure.  - Do NOT miss any doses of your blood thinner - if you should miss a dose please notify our office immediately.  - If you feel as if you go back into normal rhythm prior to scheduled cardioversion, please notify our office immediately. If your procedure is canceled in the cardioversion suite you will be charged a cancellation fee.

## 2022-04-13 ENCOUNTER — Ambulatory Visit (HOSPITAL_BASED_OUTPATIENT_CLINIC_OR_DEPARTMENT_OTHER): Payer: Medicare Other | Admitting: Family

## 2022-04-21 LAB — BASIC METABOLIC PANEL
BUN/Creatinine Ratio: 21 (ref 10–24)
BUN: 28 mg/dL — ABNORMAL HIGH (ref 8–27)
CO2: 20 mmol/L (ref 20–29)
Calcium: 9.1 mg/dL (ref 8.6–10.2)
Chloride: 104 mmol/L (ref 96–106)
Creatinine, Ser: 1.34 mg/dL — ABNORMAL HIGH (ref 0.76–1.27)
Glucose: 158 mg/dL — ABNORMAL HIGH (ref 70–99)
Potassium: 4.7 mmol/L (ref 3.5–5.2)
Sodium: 140 mmol/L (ref 134–144)
eGFR: 55 mL/min/{1.73_m2} — ABNORMAL LOW (ref >59)

## 2022-04-23 ENCOUNTER — Ambulatory Visit (HOSPITAL_COMMUNITY): Payer: Medicare Other | Admitting: Anesthesiology

## 2022-04-23 ENCOUNTER — Other Ambulatory Visit: Payer: Self-pay

## 2022-04-23 ENCOUNTER — Encounter (HOSPITAL_COMMUNITY)
Admission: RE | Disposition: A | Discharge status: Home / Self Care | Payer: Self-pay | Source: Home / Self Care | Attending: Cardiovascular Disease

## 2022-04-23 ENCOUNTER — Encounter (HOSPITAL_COMMUNITY): Payer: Self-pay | Admitting: Cardiovascular Disease

## 2022-04-23 ENCOUNTER — Ambulatory Visit (HOSPITAL_BASED_OUTPATIENT_CLINIC_OR_DEPARTMENT_OTHER): Payer: Medicare Other | Admitting: Anesthesiology

## 2022-04-23 ENCOUNTER — Ambulatory Visit (HOSPITAL_COMMUNITY)
Admission: RE | Admit: 2022-04-23 | Discharge: 2022-04-23 | Disposition: A | Discharge status: Home / Self Care | Payer: Medicare Other | Attending: Cardiovascular Disease | Admitting: Cardiovascular Disease

## 2022-04-23 DIAGNOSIS — E785 Hyperlipidemia, unspecified: Secondary | ICD-10-CM | POA: Insufficient documentation

## 2022-04-23 DIAGNOSIS — I2510 Atherosclerotic heart disease of native coronary artery without angina pectoris: Secondary | ICD-10-CM | POA: Diagnosis not present

## 2022-04-23 DIAGNOSIS — M1990 Unspecified osteoarthritis, unspecified site: Secondary | ICD-10-CM | POA: Diagnosis not present

## 2022-04-23 DIAGNOSIS — I252 Old myocardial infarction: Secondary | ICD-10-CM | POA: Diagnosis not present

## 2022-04-23 DIAGNOSIS — I480 Paroxysmal atrial fibrillation: Secondary | ICD-10-CM | POA: Diagnosis present

## 2022-04-23 DIAGNOSIS — Z87891 Personal history of nicotine dependence: Secondary | ICD-10-CM | POA: Diagnosis not present

## 2022-04-23 DIAGNOSIS — I110 Hypertensive heart disease with heart failure: Secondary | ICD-10-CM

## 2022-04-23 DIAGNOSIS — Z950 Presence of cardiac pacemaker: Secondary | ICD-10-CM | POA: Insufficient documentation

## 2022-04-23 DIAGNOSIS — I951 Orthostatic hypotension: Secondary | ICD-10-CM | POA: Diagnosis not present

## 2022-04-23 DIAGNOSIS — E1122 Type 2 diabetes mellitus with diabetic chronic kidney disease: Secondary | ICD-10-CM | POA: Insufficient documentation

## 2022-04-23 DIAGNOSIS — Z8679 Personal history of other diseases of the circulatory system: Secondary | ICD-10-CM

## 2022-04-23 DIAGNOSIS — G4733 Obstructive sleep apnea (adult) (pediatric): Secondary | ICD-10-CM | POA: Insufficient documentation

## 2022-04-23 DIAGNOSIS — I77810 Thoracic aortic ectasia: Secondary | ICD-10-CM | POA: Diagnosis not present

## 2022-04-23 DIAGNOSIS — I4892 Unspecified atrial flutter: Secondary | ICD-10-CM | POA: Diagnosis not present

## 2022-04-23 DIAGNOSIS — D6859 Other primary thrombophilia: Secondary | ICD-10-CM | POA: Insufficient documentation

## 2022-04-23 DIAGNOSIS — Z79899 Other long term (current) drug therapy: Secondary | ICD-10-CM | POA: Insufficient documentation

## 2022-04-23 DIAGNOSIS — I5022 Chronic systolic (congestive) heart failure: Secondary | ICD-10-CM | POA: Diagnosis not present

## 2022-04-23 DIAGNOSIS — I495 Sick sinus syndrome: Secondary | ICD-10-CM | POA: Diagnosis not present

## 2022-04-23 DIAGNOSIS — I6523 Occlusion and stenosis of bilateral carotid arteries: Secondary | ICD-10-CM | POA: Diagnosis not present

## 2022-04-23 DIAGNOSIS — N183 Chronic kidney disease, stage 3 unspecified: Secondary | ICD-10-CM | POA: Insufficient documentation

## 2022-04-23 DIAGNOSIS — J45909 Unspecified asthma, uncomplicated: Secondary | ICD-10-CM | POA: Insufficient documentation

## 2022-04-23 DIAGNOSIS — I255 Ischemic cardiomyopathy: Secondary | ICD-10-CM | POA: Diagnosis not present

## 2022-04-23 DIAGNOSIS — Z7982 Long term (current) use of aspirin: Secondary | ICD-10-CM | POA: Insufficient documentation

## 2022-04-23 DIAGNOSIS — I130 Hypertensive heart and chronic kidney disease with heart failure and stage 1 through stage 4 chronic kidney disease, or unspecified chronic kidney disease: Secondary | ICD-10-CM | POA: Insufficient documentation

## 2022-04-23 DIAGNOSIS — Z951 Presence of aortocoronary bypass graft: Secondary | ICD-10-CM | POA: Diagnosis not present

## 2022-04-23 DIAGNOSIS — K219 Gastro-esophageal reflux disease without esophagitis: Secondary | ICD-10-CM | POA: Insufficient documentation

## 2022-04-23 HISTORY — PX: CARDIOVERSION: SHX1299

## 2022-04-23 LAB — GLUCOSE, CAPILLARY: Glucose-Capillary: 136 mg/dL — ABNORMAL HIGH (ref 70–99)

## 2022-04-23 SURGERY — CARDIOVERSION
Anesthesia: General

## 2022-04-23 MED ORDER — LIDOCAINE 2% (20 MG/ML) 5 ML SYRINGE
INTRAMUSCULAR | Status: DC | PRN
  Administered 2022-04-23: 80 mg via INTRAVENOUS

## 2022-04-23 MED ORDER — PROPOFOL 10 MG/ML IV BOLUS
INTRAVENOUS | Status: DC | PRN
  Administered 2022-04-23: 50 mg via INTRAVENOUS

## 2022-04-23 MED ORDER — SODIUM CHLORIDE 0.9 % IV SOLN: INTRAVENOUS | Status: DC

## 2022-04-23 NOTE — Anesthesia Preprocedure Evaluation (Addendum)
Anesthesia Evaluation  Patient identified by MRN, date of birth, ID band Patient awake    Reviewed: Allergy & Precautions, NPO status , Patient's Chart, lab work & pertinent test results  Airway Mallampati: II  TM Distance: <3 FB Neck ROM: Full    Dental   Pulmonary asthma , sleep apnea and Continuous Positive Airway Pressure Ventilation , former smoker,    Pulmonary exam normal breath sounds clear to auscultation       Cardiovascular hypertension, + CAD, + Past MI and +CHF  + dysrhythmias (LBBB) Atrial Fibrillation  Rhythm:Irregular Rate:Abnormal  ECHO 01/2022 1. Left ventricular ejection fraction, by estimation, is 45 to 50%. The  left ventricle has mildly decreased function. The left ventricle  demonstrates global hypokinesis. The left ventricular internal cavity size  was mildly dilated. Left ventricular  diastolic function could not be evaluated.  2. Right ventricular systolic function is mildly reduced. The right  ventricular size is normal.  3. Left atrial size was severely dilated.  4. Right atrial size was severely dilated.  5. The mitral valve is normal in structure. Mild mitral valve  regurgitation.  6. The aortic valve is tricuspid. Aortic valve regurgitation is not  visualized. No aortic stenosis is present.  7. There is mild dilatation of the ascending aorta, measuring 43 mm.    Neuro/Psych  Headaches,  Neuromuscular disease (diabetic neuropathy) negative psych ROS   GI/Hepatic Neg liver ROS, GERD  Medicated,  Endo/Other  negative endocrine ROSdiabetes  Renal/GU Renal InsufficiencyRenal disease (nephrolithiasis)  negative genitourinary   Musculoskeletal  (+) Arthritis ,   Abdominal   Peds negative pediatric ROS (+)  Hematology  (+) Blood dyscrasia, anemia ,   Anesthesia Other Findings   Reproductive/Obstetrics negative OB ROS                           Anesthesia  Physical Anesthesia Plan  ASA: 3  Anesthesia Plan: General   Post-op Pain Management:    Induction: Intravenous  PONV Risk Score and Plan: 2 and Propofol infusion, Treatment may vary due to age or medical condition and Ondansetron  Airway Management Planned: Mask  Additional Equipment:   Intra-op Plan:   Post-operative Plan: Extubation in OR  Informed Consent: I have reviewed the patients History and Physical, chart, labs and discussed the procedure including the risks, benefits and alternatives for the proposed anesthesia with the patient or authorized representative who has indicated his/her understanding and acceptance.     Dental advisory given  Plan Discussed with: CRNA, Anesthesiologist and Surgeon  Anesthesia Plan Comments:        Anesthesia Quick Evaluation

## 2022-04-23 NOTE — CV Procedure (Signed)
Electrical Cardioversion Procedure Note JAELYNN CURRIER [id number] [birth year]  Procedure: Electrical Cardioversion Indications:  Atrial Flutter  Procedure Details Consent: Risks of procedure as well as the alternatives and risks of each were explained to the (patient/caregiver).  Consent for procedure obtained. Time Out: Verified patient identification, verified procedure, site/side was marked, verified correct patient position, special equipment/implants available, medications/allergies/relevent history reviewed, required imaging and test results available.  Performed  Patient placed on cardiac monitor, pulse oximetry, supplemental oxygen as necessary.  Sedation given:  propofol Pacer pads placed anterior and posterior chest.  Cardioverted 1 time(s).  Cardioverted at [place].  Evaluation Findings: Post procedure EKG shows:  APVP Complications: None Patient did tolerate procedure well.   Skeet Latch, MD 04/23/2022, 11:43 AM

## 2022-04-23 NOTE — Discharge Instructions (Signed)

## 2022-04-23 NOTE — Anesthesia Postprocedure Evaluation (Signed)
Anesthesia Post Note  Patient: Charles [name]  Procedure(s) Performed: CARDIOVERSION     Patient location during evaluation: PACU Anesthesia Type: General Level of consciousness: awake Pain management: pain level controlled Vital Signs Assessment: post-procedure vital signs reviewed and stable Respiratory status: spontaneous breathing and respiratory function stable Cardiovascular status: stable Postop Assessment: no apparent nausea or vomiting Anesthetic complications: no   No notable events documented.  Last Vitals:  Vitals:   04/23/22 1142 04/23/22 1150  BP: (!) 150/88 104/67  Pulse: 73 70  Resp: 11 16  Temp:    SpO2: 99% 95%    Last Pain:  Vitals:   04/23/22 1200  TempSrc:   PainSc: 0-No pain                 Merlinda Frederick

## 2022-04-23 NOTE — Transfer of Care (Signed)
Immediate Anesthesia Transfer of Care Note  Patient: Charles [name]  Procedure(s) Performed: CARDIOVERSION  Patient Location: PACU and Endoscopy Unit  Anesthesia Type:General  Level of Consciousness: drowsy  Airway & Oxygen Therapy: Patient Spontanous Breathing and Patient connected to nasal cannula oxygen  Post-op Assessment: Report given to RN and Post -op Vital signs reviewed and stable  Post vital signs: Reviewed and stable  Last Vitals:  Vitals Value Taken Time  BP    Temp    Pulse    Resp    SpO2      Last Pain:  Vitals:   04/23/22 1116  TempSrc: Temporal  PainSc: 0-No pain         Complications: No notable events documented.

## 2022-04-26 ENCOUNTER — Encounter (HOSPITAL_COMMUNITY): Payer: Self-pay | Admitting: Cardiovascular Disease

## 2022-04-26 ENCOUNTER — Ambulatory Visit (INDEPENDENT_AMBULATORY_CARE_PROVIDER_SITE_OTHER): Payer: Medicare Other

## 2022-04-26 DIAGNOSIS — Z950 Presence of cardiac pacemaker: Secondary | ICD-10-CM

## 2022-04-26 DIAGNOSIS — I5022 Chronic systolic (congestive) heart failure: Secondary | ICD-10-CM | POA: Diagnosis not present

## 2022-04-27 ENCOUNTER — Telehealth: Payer: Self-pay

## 2022-04-27 NOTE — Progress Notes (Signed)
EPIC Encounter for ICM Monitoring  Patient Name: Charles [name] is a 76 y.o. male Date: 04/27/2022 Primary Care Physican: Cyndi Bender, PA-C Primary Cardiologist: Claiborne Billings Electrophysiologist: [name] Bi-V Pacing:  74% (previously 97% on 5/8%)  11/27/2020 Weight: 286.4 lbs  03/17/2022 Weight: 281 lbs   AT/AF Burden: 36 % on 6/5 report                                                          Attempted call to patient and unable to reach.  Left detailed message per DPR regarding transmission. Transmission reviewed.  DCCV completed 6/2.     CorVue thoracic impedance suggesting possible fluid accumulation starting 5/30 and returned to baseline 6/5.   Prescribed: Furosemide 40 mg take 1 tablet (40 mg total) by mouth daily as needed Farxiga 50 mg take 1 tablet daily Xarelto 20 mg take 1 tablet at supper daily   Labs: 04/20/2022 Creatinine 1.34, BUN 28, Potassium 4.7, Sodium 140, GFR 55 04/06/2022 Creatinine 1.55, BUN 34, Potassium 4.3, Sodium 139 04/05/2022 Creatinine 1.54, BUN 32, Potassium 4.2, Sodium 140, GFR 47 04/01/2022 Creatinine 1.34, BUN 23, Potassium 4.8, Sodium 139, GFR 55 03/12/2022 Creatinine 1.22, BUN 21, Potassium 4.6, Sodium 141, GFR 62 02/11/2022 Creatinine 1.40, BUN 21, Potassium 4.3, Sodium 141 A complete set of results can be found in Results Review.   Recommendations:  Left voice mail with ICM number and encouraged to call if experiencing any fluid symptoms.   Follow-up plan: ICM clinic phone appointment on 05/31/2022.   91 day device clinic remote transmission 05/17/2022.   EP/Cardiology Office Visits:  Afib clinic 04/29/2022.   05/14/2022 with Terie Purser NP.  Recall 11/15/2022 with Oda Kilts, PA.  Recall 07/11/2022 with Dr Claiborne Billings.   Copy of ICM check sent to Dr. [name]    3 month ICM trend: 04/26/2022.    12-14 Month ICM trend:     Rosalene Billings, RN 04/27/2022 2:46 PM

## 2022-04-27 NOTE — Telephone Encounter (Signed)
Remote ICM transmission received.  Attempted call to patient regarding ICM remote transmission and left detailed message per DPR.  Advised to return call for any fluid symptoms or questions. Next ICM remote transmission scheduled 05/31/2022.

## 2022-04-29 ENCOUNTER — Ambulatory Visit (HOSPITAL_COMMUNITY)
Admission: RE | Admit: 2022-04-29 | Discharge: 2022-04-29 | Disposition: A | Discharge status: Home / Self Care | Payer: Medicare Other | Source: Ambulatory Visit | Attending: Physician Assistant | Admitting: Physician Assistant

## 2022-04-29 ENCOUNTER — Encounter (HOSPITAL_COMMUNITY): Payer: Self-pay | Admitting: Physician Assistant

## 2022-04-29 VITALS — BP 130/80 | HR 72 | Ht 69.0 in | Wt 288.0 lb

## 2022-04-29 DIAGNOSIS — I4892 Unspecified atrial flutter: Secondary | ICD-10-CM | POA: Diagnosis not present

## 2022-04-29 DIAGNOSIS — E669 Obesity, unspecified: Secondary | ICD-10-CM | POA: Insufficient documentation

## 2022-04-29 DIAGNOSIS — D6869 Other thrombophilia: Secondary | ICD-10-CM | POA: Diagnosis not present

## 2022-04-29 DIAGNOSIS — R001 Bradycardia, unspecified: Secondary | ICD-10-CM | POA: Insufficient documentation

## 2022-04-29 DIAGNOSIS — N189 Chronic kidney disease, unspecified: Secondary | ICD-10-CM | POA: Diagnosis not present

## 2022-04-29 DIAGNOSIS — I4819 Other persistent atrial fibrillation: Secondary | ICD-10-CM | POA: Insufficient documentation

## 2022-04-29 DIAGNOSIS — G4733 Obstructive sleep apnea (adult) (pediatric): Secondary | ICD-10-CM | POA: Insufficient documentation

## 2022-04-29 DIAGNOSIS — Z6841 Body Mass Index (BMI) 40.0 and over, adult: Secondary | ICD-10-CM | POA: Insufficient documentation

## 2022-04-29 DIAGNOSIS — I130 Hypertensive heart and chronic kidney disease with heart failure and stage 1 through stage 4 chronic kidney disease, or unspecified chronic kidney disease: Secondary | ICD-10-CM | POA: Insufficient documentation

## 2022-04-29 DIAGNOSIS — I2510 Atherosclerotic heart disease of native coronary artery without angina pectoris: Secondary | ICD-10-CM | POA: Insufficient documentation

## 2022-04-29 DIAGNOSIS — I5042 Chronic combined systolic (congestive) and diastolic (congestive) heart failure: Secondary | ICD-10-CM | POA: Diagnosis not present

## 2022-04-29 DIAGNOSIS — I495 Sick sinus syndrome: Secondary | ICD-10-CM | POA: Diagnosis not present

## 2022-04-29 DIAGNOSIS — Z7901 Long term (current) use of anticoagulants: Secondary | ICD-10-CM | POA: Insufficient documentation

## 2022-04-29 DIAGNOSIS — Z951 Presence of aortocoronary bypass graft: Secondary | ICD-10-CM | POA: Diagnosis not present

## 2022-04-29 DIAGNOSIS — E1122 Type 2 diabetes mellitus with diabetic chronic kidney disease: Secondary | ICD-10-CM | POA: Insufficient documentation
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
## 2022-05-05 ENCOUNTER — Other Ambulatory Visit: Payer: Self-pay | Admitting: Endocrinology

## 2022-05-08 ENCOUNTER — Other Ambulatory Visit: Payer: Self-pay | Admitting: Cardiovascular Disease

## 2022-05-14 ENCOUNTER — Encounter (HOSPITAL_BASED_OUTPATIENT_CLINIC_OR_DEPARTMENT_OTHER): Payer: Self-pay | Admitting: Family

## 2022-05-14 ENCOUNTER — Ambulatory Visit (INDEPENDENT_AMBULATORY_CARE_PROVIDER_SITE_OTHER): Payer: Medicare Other | Admitting: Family

## 2022-05-14 VITALS — BP 108/74 | HR 66 | Ht 69.0 in | Wt 283.6 lb

## 2022-05-14 DIAGNOSIS — I4819 Other persistent atrial fibrillation: Secondary | ICD-10-CM

## 2022-05-14 DIAGNOSIS — I25118 Atherosclerotic heart disease of native coronary artery with other forms of angina pectoris: Secondary | ICD-10-CM | POA: Diagnosis not present

## 2022-05-14 DIAGNOSIS — E785 Hyperlipidemia, unspecified: Secondary | ICD-10-CM

## 2022-05-14 DIAGNOSIS — I5022 Chronic systolic (congestive) heart failure: Secondary | ICD-10-CM | POA: Diagnosis not present

## 2022-05-14 DIAGNOSIS — D6859 Other primary thrombophilia: Secondary | ICD-10-CM | POA: Diagnosis not present

## 2022-05-15 ENCOUNTER — Encounter (HOSPITAL_BASED_OUTPATIENT_CLINIC_OR_DEPARTMENT_OTHER): Payer: Self-pay | Admitting: Family

## 2022-05-17 ENCOUNTER — Ambulatory Visit (INDEPENDENT_AMBULATORY_CARE_PROVIDER_SITE_OTHER): Payer: Medicare Other

## 2022-05-17 DIAGNOSIS — I255 Ischemic cardiomyopathy: Secondary | ICD-10-CM

## 2022-05-18 LAB — CUP PACEART REMOTE DEVICE CHECK
Battery Remaining Longevity: 64 mo
Battery Remaining Percentage: 64 %
Battery Voltage: 2.98 V
Brady Statistic AP VP Percent: 92 %
Brady Statistic AP VS Percent: 1 %
Brady Statistic AS VP Percent: 5.3 %
Brady Statistic AS VS Percent: 1.4 %
Brady Statistic RA Percent Paced: 65 %
Date Time Interrogation Session: 20230626020021
Implantable Lead Implant Date: 20091007
Implantable Lead Implant Date: 20091007
Implantable Lead Implant Date: 20200922
Implantable Lead Location: 753858
Implantable Lead Location: 753859
Implantable Lead Location: 753860
Implantable Pulse Generator Implant Date: 20200922
Lead Channel Impedance Value: 1275 Ohm
Lead Channel Impedance Value: 450 Ohm
Lead Channel Impedance Value: 630 Ohm
Lead Channel Pacing Threshold Amplitude: 0.75 V
Lead Channel Pacing Threshold Amplitude: 0.75 V
Lead Channel Pacing Threshold Amplitude: 1.25 V
Lead Channel Pacing Threshold Pulse Width: 0.4 ms
Lead Channel Pacing Threshold Pulse Width: 0.4 ms
Lead Channel Pacing Threshold Pulse Width: 0.5 ms
Lead Channel Sensing Intrinsic Amplitude: 12 mV
Lead Channel Sensing Intrinsic Amplitude: 4 mV
Lead Channel Setting Pacing Amplitude: 1.75 V
Lead Channel Setting Pacing Amplitude: 2 V
Lead Channel Setting Pacing Amplitude: 2.25 V
Lead Channel Setting Pacing Pulse Width: 0.4 ms
Lead Channel Setting Pacing Pulse Width: 0.5 ms
Lead Channel Setting Sensing Sensitivity: 2 mV
Pulse Gen Model: 3562
Pulse Gen Serial Number: 9157656

## 2022-05-28 ENCOUNTER — Other Ambulatory Visit: Payer: Self-pay | Admitting: Endocrinology

## 2022-05-31 ENCOUNTER — Ambulatory Visit (INDEPENDENT_AMBULATORY_CARE_PROVIDER_SITE_OTHER): Payer: Medicare Other

## 2022-05-31 DIAGNOSIS — Z950 Presence of cardiac pacemaker: Secondary | ICD-10-CM

## 2022-05-31 DIAGNOSIS — I5022 Chronic systolic (congestive) heart failure: Secondary | ICD-10-CM

## 2022-06-03 NOTE — Progress Notes (Signed)
EPIC Encounter for ICM Monitoring  Patient Name: Charles [name] is a 76 y.o. male Date: 06/03/2022 Primary Care Physican: Cyndi Bender, PA-C Primary Cardiologist: Claiborne Billings Electrophysiologist: [name] Bi-V Pacing: 82%  11/27/2020 Weight: 286.4 lbs  03/17/2022 Weight: 281 lbs 05/14/2022 Office Weight: 283 lbs   AT/AF Burden: 24 %                                                          Spoke with patient and heart failure questions reviewed.  Pt asymptomatic for fluid accumulation.  Reports feeling well at this time and voices no complaints.    CorVue thoracic impedance suggesting normal fluid levels.   Prescribed: Furosemide 40 mg take 1 tablet (40 mg total) by mouth daily as needed Farxiga 50 mg take 1 tablet daily Xarelto 20 mg take 1 tablet at supper daily   Labs: 04/20/2022 Creatinine 1.34, BUN 28, Potassium 4.7, Sodium 140, GFR 55 04/06/2022 Creatinine 1.55, BUN 34, Potassium 4.3, Sodium 139 04/05/2022 Creatinine 1.54, BUN 32, Potassium 4.2, Sodium 140, GFR 47 04/01/2022 Creatinine 1.34, BUN 23, Potassium 4.8, Sodium 139, GFR 55 03/12/2022 Creatinine 1.22, BUN 21, Potassium 4.6, Sodium 141, GFR 62 02/11/2022 Creatinine 1.40, BUN 21, Potassium 4.3, Sodium 141 A complete set of results can be found in Results Review.   Recommendations:  No changes and encouraged to call if experiencing any fluid symptoms.   Follow-up plan: ICM clinic phone appointment on 07/05/2022.   91 day device clinic remote transmission 08/16/2022.   EP/Cardiology Office Visits:   Recall 11/15/2022 with Oda Kilts, [place].  11/05/2022 with Dr Claiborne Billings.   Copy of ICM check sent to Dr. [name].  Direct Trend through 06/03/2022   3 month ICM trend: 05/31/2022.    12-14 Month ICM trend:     Rosalene Billings, RN 06/03/2022 10:58 AM

## 2022-06-07 ENCOUNTER — Encounter: Payer: Self-pay | Admitting: Internal Medicine

## 2022-06-08 ENCOUNTER — Ambulatory Visit (HOSPITAL_COMMUNITY)
Admission: RE | Admit: 2022-06-08 | Discharge: 2022-06-08 | Disposition: A | Discharge status: Home / Self Care | Payer: Medicare Other | Source: Ambulatory Visit | Attending: Cardiology | Admitting: Cardiology

## 2022-06-08 DIAGNOSIS — I6523 Occlusion and stenosis of bilateral carotid arteries: Secondary | ICD-10-CM | POA: Diagnosis present

## 2022-06-10 ENCOUNTER — Telehealth (HOSPITAL_BASED_OUTPATIENT_CLINIC_OR_DEPARTMENT_OTHER): Payer: Self-pay

## 2022-06-10 DIAGNOSIS — I6523 Occlusion and stenosis of bilateral carotid arteries: Secondary | ICD-10-CM

## 2022-06-10 NOTE — Progress Notes (Signed)
Remote pacemaker transmission.   

## 2022-06-10 NOTE — Telephone Encounter (Addendum)
Results called to patient who verbalizes understanding! Repeat testing ordered.       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 Stable compared to previous. Repeat duplex in 1 year for monitoring

## 2022-06-30 ENCOUNTER — Telehealth: Payer: Self-pay

## 2022-06-30 ENCOUNTER — Other Ambulatory Visit: Payer: Self-pay

## 2022-06-30 ENCOUNTER — Other Ambulatory Visit: Payer: Self-pay | Admitting: Endocrinology

## 2022-06-30 MED ORDER — GVOKE HYPOPEN 2-PACK 1 MG/0.2ML ~~LOC~~ SOAJ: SUBCUTANEOUS | 1 refills | Status: AC

## 2022-06-30 NOTE — Telephone Encounter (Signed)
Patient wife called in states that patient has experienced some low blood sugars this week (in the 40's) and patient has been extremely confused and sleeping a lot. Monday morning blood sugar was 41. Yesterday am-114, lunch-100, and dinner-180. Today am-41, lunch-101, currently reading low. Patient took 20 units of Novolog and 42 units of Tresiba this morning. Please advise.

## 2022-06-30 NOTE — Telephone Encounter (Signed)
Called patient back states that he took 4 glucose tablets and blood sugar is still low. He will drink coke and see if it goes up.

## 2022-07-05 ENCOUNTER — Ambulatory Visit (INDEPENDENT_AMBULATORY_CARE_PROVIDER_SITE_OTHER): Payer: Medicare Other

## 2022-07-05 DIAGNOSIS — I5022 Chronic systolic (congestive) heart failure: Secondary | ICD-10-CM

## 2022-07-05 DIAGNOSIS — Z950 Presence of cardiac pacemaker: Secondary | ICD-10-CM

## 2022-07-08 ENCOUNTER — Other Ambulatory Visit: Payer: Self-pay | Admitting: Endocrinology

## 2022-07-08 NOTE — Progress Notes (Signed)
EPIC Encounter for ICM Monitoring  Patient Name: Charles [name] is a 76 y.o. male Date: 07/08/2022 Primary Care Physican: Cyndi Bender, PA-C Primary Cardiologist: Claiborne Billings Electrophysiologist: [name] Bi-V Pacing: 86%  11/27/2020 Weight: 286.4 lbs  03/17/2022 Weight: 281 lbs 05/14/2022 Office Weight: 283 lbs   AT/AF Burden: 18 %                                                          Spoke with wife (per DPR) due pt was sleeping and heart failure questions reviewed.  Pt asymptomatic for fluid accumulation. She reports his blood sugars running low but other wise doing well.    CorVue thoracic impedance suggesting normal fluid levels with exception of possible fluid accumulation from 8/8-8/11.   Prescribed: Furosemide 40 mg take 1 tablet (40 mg total) by mouth daily as needed Farxiga 50 mg take 1 tablet daily Xarelto 20 mg take 1 tablet at supper daily   Labs: 04/20/2022 Creatinine 1.34, BUN 28, Potassium 4.7, Sodium 140, GFR 55 04/06/2022 Creatinine 1.55, BUN 34, Potassium 4.3, Sodium 139 04/05/2022 Creatinine 1.54, BUN 32, Potassium 4.2, Sodium 140, GFR 47 04/01/2022 Creatinine 1.34, BUN 23, Potassium 4.8, Sodium 139, GFR 55 03/12/2022 Creatinine 1.22, BUN 21, Potassium 4.6, Sodium 141, GFR 62 02/11/2022 Creatinine 1.40, BUN 21, Potassium 4.3, Sodium 141 A complete set of results can be found in Results Review.   Recommendations:  No changes and encouraged to call if experiencing any fluid symptoms.   Follow-up plan: ICM clinic phone appointment on 08/09/2022.   91 day device clinic remote transmission 08/16/2022.   EP/Cardiology Office Visits:   Recall 11/15/2022 with Oda Kilts, [place].  11/05/2022 with Dr Claiborne Billings.   Copy of ICM check sent to Dr. [name].   3 month ICM trend: 07/05/2022.    12-14 Month ICM trend:     Rosalene Billings, RN 07/08/2022 3:21 PM

## 2022-07-21 ENCOUNTER — Other Ambulatory Visit (INDEPENDENT_AMBULATORY_CARE_PROVIDER_SITE_OTHER): Payer: Medicare Other

## 2022-07-21 ENCOUNTER — Other Ambulatory Visit: Payer: Self-pay | Admitting: Endocrinology

## 2022-07-21 DIAGNOSIS — E1165 Type 2 diabetes mellitus with hyperglycemia: Secondary | ICD-10-CM | POA: Diagnosis not present

## 2022-07-21 DIAGNOSIS — Z794 Long term (current) use of insulin: Secondary | ICD-10-CM

## 2022-07-21 DIAGNOSIS — E782 Mixed hyperlipidemia: Secondary | ICD-10-CM

## 2022-07-21 LAB — LIPID PANEL
Cholesterol: 170 mg/dL (ref 0–200)
HDL: 44.2 mg/dL (ref >39.00)
LDL Cholesterol: 101 mg/dL — ABNORMAL HIGH (ref 0–99)
NonHDL: 125.9
Total CHOL/HDL Ratio: 4
Triglycerides: 124 mg/dL (ref 0.0–149.0)
VLDL: 24.8 mg/dL (ref 0.0–40.0)

## 2022-07-21 LAB — COMPREHENSIVE METABOLIC PANEL
ALT: 16 U/L (ref 0–53)
AST: 14 U/L (ref 0–37)
Albumin: 3.8 g/dL (ref 3.5–5.2)
Alkaline Phosphatase: 70 U/L (ref 39–117)
BUN: 25 mg/dL — ABNORMAL HIGH (ref 6–23)
CO2: 23 mEq/L (ref 19–32)
Calcium: 8.7 mg/dL (ref 8.4–10.5)
Chloride: 107 mEq/L (ref 96–112)
Creatinine, Ser: 1.19 mg/dL (ref 0.40–1.50)
GFR: 59.72 mL/min — ABNORMAL LOW (ref >60.00)
Glucose, Bld: 104 mg/dL — ABNORMAL HIGH (ref 70–99)
Potassium: 4.3 mEq/L (ref 3.5–5.1)
Sodium: 138 mEq/L (ref 135–145)
Total Bilirubin: 0.5 mg/dL (ref 0.2–1.2)
Total Protein: 7.1 g/dL (ref 6.0–8.3)

## 2022-07-21 LAB — HEMOGLOBIN A1C: Hgb A1c MFr Bld: 7.4 % — ABNORMAL HIGH (ref 4.6–6.5)

## 2022-07-27 ENCOUNTER — Encounter: Payer: Self-pay | Admitting: Endocrinology

## 2022-07-27 ENCOUNTER — Other Ambulatory Visit: Payer: Self-pay | Admitting: Endocrinology

## 2022-07-27 ENCOUNTER — Ambulatory Visit (INDEPENDENT_AMBULATORY_CARE_PROVIDER_SITE_OTHER): Payer: Medicare Other | Admitting: Endocrinology

## 2022-07-27 VITALS — BP 144/68 | HR 77 | Ht 69.0 in | Wt 288.8 lb

## 2022-07-27 DIAGNOSIS — E782 Mixed hyperlipidemia: Secondary | ICD-10-CM

## 2022-07-27 DIAGNOSIS — Z794 Long term (current) use of insulin: Secondary | ICD-10-CM | POA: Diagnosis not present

## 2022-07-27 DIAGNOSIS — E1165 Type 2 diabetes mellitus with hyperglycemia: Secondary | ICD-10-CM

## 2022-07-27 DIAGNOSIS — I255 Ischemic cardiomyopathy: Secondary | ICD-10-CM
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
## 2022-07-27 NOTE — Patient Instructions (Addendum)
Reduce Novolog to 12 in am and 12-15 at lunch  Restart Ezetemibe for cholesterol  Check blood sugars on waking up   Also check blood sugars about 2 hours after meals and do this after different meals by rotation  Recommended blood sugar levels on waking up are 90-130 and about 2 hours after meal is 130-160  Please bring your blood sugar monitor to each visit, thank you

## 2022-07-28 ENCOUNTER — Ambulatory Visit: Payer: Medicare Other | Admitting: Physician Assistant

## 2022-07-28 ENCOUNTER — Other Ambulatory Visit: Payer: Self-pay

## 2022-07-28 DIAGNOSIS — E782 Mixed hyperlipidemia: Secondary | ICD-10-CM

## 2022-07-28 DIAGNOSIS — E1165 Type 2 diabetes mellitus with hyperglycemia: Secondary | ICD-10-CM

## 2022-07-28 MED ORDER — EZETIMIBE 10 MG PO TABS: 10.0000 mg | ORAL_TABLET | Freq: Every day | ORAL | 2 refills | Status: DC

## 2022-07-29 ENCOUNTER — Encounter (HOSPITAL_COMMUNITY): Payer: Self-pay | Admitting: Physician Assistant

## 2022-07-29 ENCOUNTER — Ambulatory Visit (HOSPITAL_COMMUNITY)
Admission: RE | Admit: 2022-07-29 | Discharge: 2022-07-29 | Disposition: A | Discharge status: Home / Self Care | Payer: Medicare Other | Source: Ambulatory Visit | Attending: Physician Assistant | Admitting: Physician Assistant

## 2022-07-29 VITALS — BP 148/88 | HR 79 | Ht 69.0 in | Wt 286.2 lb

## 2022-07-29 DIAGNOSIS — I2510 Atherosclerotic heart disease of native coronary artery without angina pectoris: Secondary | ICD-10-CM | POA: Insufficient documentation

## 2022-07-29 DIAGNOSIS — D6869 Other thrombophilia: Secondary | ICD-10-CM | POA: Insufficient documentation

## 2022-07-29 DIAGNOSIS — N183 Chronic kidney disease, stage 3 unspecified: Secondary | ICD-10-CM | POA: Diagnosis not present

## 2022-07-29 DIAGNOSIS — Z951 Presence of aortocoronary bypass graft: Secondary | ICD-10-CM | POA: Insufficient documentation

## 2022-07-29 DIAGNOSIS — I5042 Chronic combined systolic (congestive) and diastolic (congestive) heart failure: Secondary | ICD-10-CM | POA: Insufficient documentation

## 2022-07-29 DIAGNOSIS — Z6841 Body Mass Index (BMI) 40.0 and over, adult: Secondary | ICD-10-CM | POA: Diagnosis not present

## 2022-07-29 DIAGNOSIS — E1122 Type 2 diabetes mellitus with diabetic chronic kidney disease: Secondary | ICD-10-CM | POA: Diagnosis not present

## 2022-07-29 DIAGNOSIS — I130 Hypertensive heart and chronic kidney disease with heart failure and stage 1 through stage 4 chronic kidney disease, or unspecified chronic kidney disease: Secondary | ICD-10-CM | POA: Insufficient documentation

## 2022-07-29 DIAGNOSIS — Z7901 Long term (current) use of anticoagulants: Secondary | ICD-10-CM | POA: Insufficient documentation

## 2022-07-29 DIAGNOSIS — E669 Obesity, unspecified: Secondary | ICD-10-CM | POA: Insufficient documentation

## 2022-07-29 DIAGNOSIS — I4892 Unspecified atrial flutter: Secondary | ICD-10-CM | POA: Diagnosis not present

## 2022-07-29 DIAGNOSIS — G4733 Obstructive sleep apnea (adult) (pediatric): Secondary | ICD-10-CM | POA: Diagnosis not present

## 2022-07-29 DIAGNOSIS — Z79899 Other long term (current) drug therapy: Secondary | ICD-10-CM | POA: Insufficient documentation

## 2022-07-29 DIAGNOSIS — I4819 Other persistent atrial fibrillation: Secondary | ICD-10-CM | POA: Insufficient documentation
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
## 2022-07-30 ENCOUNTER — Encounter: Payer: Self-pay | Admitting: Endocrinology

## 2022-08-02 ENCOUNTER — Telehealth: Payer: Self-pay

## 2022-08-02 NOTE — Telephone Encounter (Signed)
Patient called states that he has arthritis in Left hip. Wants to know if you have any reservations on getting a shot to help with the pain. States last time it shot blood sugar up.

## 2022-08-09 ENCOUNTER — Ambulatory Visit (INDEPENDENT_AMBULATORY_CARE_PROVIDER_SITE_OTHER): Payer: Medicare Other

## 2022-08-09 DIAGNOSIS — I5022 Chronic systolic (congestive) heart failure: Secondary | ICD-10-CM

## 2022-08-09 DIAGNOSIS — Z950 Presence of cardiac pacemaker: Secondary | ICD-10-CM

## 2022-08-13 NOTE — Progress Notes (Signed)
EPIC Encounter for ICM Monitoring  Patient Name: Charles [name] is a 76 y.o. male Date: 08/13/2022 Primary Care Physican: Cyndi Bender, PA-C Primary Cardiologist: Claiborne Billings Electrophysiologist: Mealor Bi-V Pacing: 88%  11/27/2020 Weight: 286.4 lbs  03/17/2022 Weight: 281 lbs 05/14/2022 Office Weight: 283 lbs 08/13/2022 Weight: 279 lbs   AT/AF Burden: 14 %                                                          Spoke with wife (per DPR) due pt was sleeping and heart failure questions reviewed.  Pt asymptomatic for fluid accumulation.  He will be taking a class on Mediterranean diet.     CorVue thoracic impedance suggesting possible fluid accumulation from 8/29-9/12 followed by possible dryness starting 9/13.   Prescribed: Furosemide 40 mg take 1 tablet (40 mg total) by mouth daily as needed Farxiga 50 mg take 1 tablet daily Xarelto 20 mg take 1 tablet at supper daily   Labs: 07/21/2022 Creatinine 1.19, BUN 25, Potassium 4.3, Sodium 138, GFR 59.72 04/20/2022 Creatinine 1.34, BUN 28, Potassium 4.7, Sodium 140, GFR 55 04/06/2022 Creatinine 1.55, BUN 34, Potassium 4.3, Sodium 139 04/05/2022 Creatinine 1.54, BUN 32, Potassium 4.2, Sodium 140, GFR 47 04/01/2022 Creatinine 1.34, BUN 23, Potassium 4.8, Sodium 139, GFR 55 03/12/2022 Creatinine 1.22, BUN 21, Potassium 4.6, Sodium 141, GFR 62 02/11/2022 Creatinine 1.40, BUN 21, Potassium 4.3, Sodium 141 A complete set of results can be found in Results Review.   Recommendations:  He reports drinking plenty of fluids.  No changes and encouraged to call if experiencing any fluid symptoms.   Follow-up plan: ICM clinic phone appointment on 09/13/2022.   91 day device clinic remote transmission 11/16/2022.   EP/Cardiology Office Visits:   11/17/2022 with Dr Myles Gip.  11/05/2022 with Dr Claiborne Billings   Copy of ICM check sent to Dr. Myles Gip (former [facility]).   3 month ICM trend: 08/09/2022.    12-14 Month ICM trend:     Rosalene Billings,  RN 08/13/2022 3:18 PM

## 2022-08-16 ENCOUNTER — Ambulatory Visit (INDEPENDENT_AMBULATORY_CARE_PROVIDER_SITE_OTHER): Payer: Medicare Other

## 2022-08-16 DIAGNOSIS — I495 Sick sinus syndrome: Secondary | ICD-10-CM | POA: Diagnosis not present

## 2022-08-17 LAB — CUP PACEART REMOTE DEVICE CHECK
Battery Remaining Longevity: 61 mo
Battery Remaining Percentage: 61 %
Battery Voltage: 2.98 V
Brady Statistic AP VP Percent: 90 %
Brady Statistic AP VS Percent: 1 %
Brady Statistic AS VP Percent: 7.2 %
Brady Statistic AS VS Percent: 1.4 %
Brady Statistic RA Percent Paced: 75 %
Date Time Interrogation Session: 20230925020014
Implantable Lead Implant Date: 20091007
Implantable Lead Implant Date: 20091007
Implantable Lead Implant Date: 20200922
Implantable Lead Location: 753858
Implantable Lead Location: 753859
Implantable Lead Location: 753860
Implantable Pulse Generator Implant Date: 20200922
Lead Channel Impedance Value: 1275 Ohm
Lead Channel Impedance Value: 460 Ohm
Lead Channel Impedance Value: 650 Ohm
Lead Channel Pacing Threshold Amplitude: 0.625 V
Lead Channel Pacing Threshold Amplitude: 0.75 V
Lead Channel Pacing Threshold Amplitude: 1.375 V
Lead Channel Pacing Threshold Pulse Width: 0.4 ms
Lead Channel Pacing Threshold Pulse Width: 0.4 ms
Lead Channel Pacing Threshold Pulse Width: 0.5 ms
Lead Channel Sensing Intrinsic Amplitude: 12 mV
Lead Channel Sensing Intrinsic Amplitude: 4.6 mV
Lead Channel Setting Pacing Amplitude: 1.75 V
Lead Channel Setting Pacing Amplitude: 2 V
Lead Channel Setting Pacing Amplitude: 2.375
Lead Channel Setting Pacing Pulse Width: 0.4 ms
Lead Channel Setting Pacing Pulse Width: 0.5 ms
Lead Channel Setting Sensing Sensitivity: 2 mV
Pulse Gen Model: 3562
Pulse Gen Serial Number: 9157656

## 2022-08-26 ENCOUNTER — Encounter (HOSPITAL_BASED_OUTPATIENT_CLINIC_OR_DEPARTMENT_OTHER): Payer: Self-pay | Admitting: Internal Medicine

## 2022-08-26 ENCOUNTER — Other Ambulatory Visit: Payer: Self-pay | Admitting: Endocrinology

## 2022-08-26 ENCOUNTER — Telehealth: Payer: Self-pay | Admitting: Internal Medicine

## 2022-08-26 ENCOUNTER — Ambulatory Visit (INDEPENDENT_AMBULATORY_CARE_PROVIDER_SITE_OTHER): Payer: Medicare Other | Admitting: Internal Medicine

## 2022-08-26 VITALS — BP 124/72 | HR 70 | Ht 69.0 in | Wt 288.4 lb

## 2022-08-26 DIAGNOSIS — Z951 Presence of aortocoronary bypass graft: Secondary | ICD-10-CM

## 2022-08-26 DIAGNOSIS — I5022 Chronic systolic (congestive) heart failure: Secondary | ICD-10-CM

## 2022-08-26 DIAGNOSIS — I255 Ischemic cardiomyopathy: Secondary | ICD-10-CM

## 2022-08-26 DIAGNOSIS — E785 Hyperlipidemia, unspecified: Secondary | ICD-10-CM

## 2022-08-26 DIAGNOSIS — I6523 Occlusion and stenosis of bilateral carotid arteries: Secondary | ICD-10-CM | POA: Diagnosis not present

## 2022-08-26 NOTE — Telephone Encounter (Signed)
Pt c/o medication issue:  1. Name of Medication: Coenzyme Q10 (COQ-10) 400 MG CAPS  FREESTYLE PRECISION NEO TEST test strip  glucose blood (FREESTYLE PRECISION NEO TEST) test strip  glucose blood (FREESTYLE PRECISION NEO TEST) test strip   2. How are you currently taking this medication (dosage and times per day)? No  3. Are you having a reaction (difficulty breathing--STAT)?   4. What is your medication issue? Pt's wife states they were in office today and gave a list to the nurse to remove these from patients med list and states that once home they noticed this was not changed and would like this to be updated and an updated list be mailed to them. Please advice.

## 2022-08-26 NOTE — Telephone Encounter (Signed)
Items removed from med list and updated list mailed to patient per request.
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
## 2022-08-26 NOTE — Patient Instructions (Signed)
Medication Instructions:  NO CHANGES today   *If you need a refill on your cardiac medications before your next appointment, please call your pharmacy*   Lab Work: FASTING lab work to check cholesterol in 3-4 months   If you have labs (blood work) drawn today and your tests are completely normal, you will receive your results only by: [place] (if you have MyChart) OR A paper copy in the mail If you have any lab test that is abnormal or we need to change your treatment, we will call you to review the results.   Follow-Up: At [facility], you and your health needs are our priority.  As part of our continuing mission to provide you with exceptional heart care, we have created designated Provider Care Teams.  These Care Teams include your primary Cardiologist (physician) and Advanced Practice Providers (APPs -  Physician Assistants and Nurse Practitioners) who all work together to provide you with the care you need, when you need it.  We recommend signing up for the patient portal called "MyChart".  Sign up information is provided on this After Visit Summary.  MyChart is used to connect with patients for Virtual Visits (Telemedicine).  Patients are able to view lab/test results, encounter notes, upcoming appointments, etc.  Non-urgent messages can be sent to your provider as well.   To learn more about what you can do with MyChart, go to [email or web address].    Your next appointment:    AS NEEDED with Dr. Gery Pray follow up with Dr. Claiborne Billings

## 2022-08-31 NOTE — Progress Notes (Signed)
Remote pacemaker transmission.   

## 2022-09-02 ENCOUNTER — Ambulatory Visit: Payer: Medicare Other | Admitting: Physician Assistant

## 2022-09-06 ENCOUNTER — Other Ambulatory Visit: Payer: Self-pay | Admitting: Cardiovascular Disease

## 2022-09-06 NOTE — Telephone Encounter (Signed)
Prescription refill request for Xarelto received.  Indication: Last office visit: 08/26/22  Alma Friendly MD Weight: 130.8kg Age: 76 Scr: 1.19 on 07/21/22 CrCl: 99.23  Based on above findings Xarelto '20mg'$  daily is the appropriate dose.  Refill approved.

## 2022-09-08 DIAGNOSIS — M25552 Pain in left hip: Secondary | ICD-10-CM

## 2022-09-08 HISTORY — DX: Pain in left hip: M25.552

## 2022-09-13 ENCOUNTER — Telehealth: Payer: Self-pay

## 2022-09-13 ENCOUNTER — Ambulatory Visit (INDEPENDENT_AMBULATORY_CARE_PROVIDER_SITE_OTHER): Payer: Medicare Other

## 2022-09-13 DIAGNOSIS — I5022 Chronic systolic (congestive) heart failure: Secondary | ICD-10-CM | POA: Diagnosis not present

## 2022-09-13 DIAGNOSIS — Z950 Presence of cardiac pacemaker: Secondary | ICD-10-CM | POA: Diagnosis not present

## 2022-09-13 NOTE — Progress Notes (Signed)
EPIC Encounter for ICM Monitoring  Patient Name: Charles [name] is a 76 y.o. male Date: 09/13/2022 Primary Care Physican: Cyndi Bender, PA-C Primary Cardiologist: Claiborne Billings Electrophysiologist: Mealor Bi-V Pacing: 89%  11/27/2020 Weight: 286.4 lbs  03/17/2022 Weight: 281 lbs 05/14/2022 Office Weight: 283 lbs 08/13/2022 Weight: 279 lbs   AT/AF Burden: 12% (taking Xarelto)                                                          Attempted call to patient and unable to reach.  Left detailed message per DPR regarding transmission. Transmission reviewed.    CorVue thoracic impedance trending close to normal baseline.   Prescribed: Furosemide 40 mg take 1 tablet (40 mg total) by mouth daily as needed Farxiga 50 mg take 1 tablet daily Xarelto 20 mg take 1 tablet at supper daily   Labs: 07/21/2022 Creatinine 1.19, BUN 25, Potassium 4.3, Sodium 138, GFR 59.72 04/20/2022 Creatinine 1.34, BUN 28, Potassium 4.7, Sodium 140, GFR 55 04/06/2022 Creatinine 1.55, BUN 34, Potassium 4.3, Sodium 139 04/05/2022 Creatinine 1.54, BUN 32, Potassium 4.2, Sodium 140, GFR 47 04/01/2022 Creatinine 1.34, BUN 23, Potassium 4.8, Sodium 139, GFR 55 03/12/2022 Creatinine 1.22, BUN 21, Potassium 4.6, Sodium 141, GFR 62 02/11/2022 Creatinine 1.40, BUN 21, Potassium 4.3, Sodium 141 A complete set of results can be found in Results Review.   Recommendations:  Left voice mail with ICM number and encouraged to call if experiencing any fluid symptoms.   Follow-up plan: ICM clinic phone appointment on 10/18/2022.   91 day device clinic remote transmission 11/16/2022.   EP/Cardiology Office Visits:   11/17/2022 with Dr Myles Gip.  11/05/2022 with Dr Claiborne Billings   Copy of ICM check sent to Dr. Myles Gip.  3 month ICM trend: 09/13/2022.    12-14 Month ICM trend:     Rosalene Billings, RN 09/13/2022 7:50 AM

## 2022-09-13 NOTE — Telephone Encounter (Signed)
Remote ICM transmission received.  Attempted call to patient regarding ICM remote transmission and left detailed message per DPR.  Advised to return call for any fluid symptoms or questions. Next ICM remote transmission scheduled 10/18/2022.

## 2022-09-25 ENCOUNTER — Other Ambulatory Visit: Payer: Self-pay | Admitting: Endocrinology

## 2022-09-29 ENCOUNTER — Telehealth: Payer: Self-pay | Admitting: Cardiovascular Disease

## 2022-09-29 NOTE — Telephone Encounter (Signed)
Patient stated he was prescribed tizanidine '2mg'$  tablets for back pain and he would like to know if it would be OK to take this medication as he read it may affect his blood pressure.

## 2022-09-29 NOTE — Telephone Encounter (Signed)
Will forward to PharmD for review and advice.

## 2022-09-30 NOTE — Telephone Encounter (Signed)
Returned call to patient and made him aware of PharmD's advice. Patient verbalized understanding.

## 2022-09-30 NOTE — Telephone Encounter (Signed)
More likely to lower blood pressure with continued use. Since it is a muscle relaxer, recommend using the lowest dose for the shortest period of time

## 2022-10-11 ENCOUNTER — Other Ambulatory Visit: Payer: Self-pay | Admitting: Endocrinology

## 2022-10-18 ENCOUNTER — Ambulatory Visit (INDEPENDENT_AMBULATORY_CARE_PROVIDER_SITE_OTHER): Payer: Medicare Other

## 2022-10-18 DIAGNOSIS — I5022 Chronic systolic (congestive) heart failure: Secondary | ICD-10-CM

## 2022-10-18 DIAGNOSIS — Z950 Presence of cardiac pacemaker: Secondary | ICD-10-CM | POA: Diagnosis not present

## 2022-10-22 NOTE — Progress Notes (Signed)
EPIC Encounter for ICM Monitoring  Patient Name: Charles [name] is a 76 y.o. male Date: 10/22/2022 Primary Care Physican: Cyndi Bender, PA-C Primary Cardiologist: Claiborne Billings Electrophysiologist: Mealor Bi-V Pacing: 90%  11/27/2020 Weight: 286.4 lbs  03/17/2022 Weight: 281 lbs 05/14/2022 Office Weight: 283 lbs 08/13/2022 Weight: 279 lbs 10/22/2022 Weigh: 277.1 lbs   AT/AF Burden: 10% (taking Xarelto)                                                          Spoke with patient and heart failure questions reviewed.  Transmission results reviewed.  Pt asymptomatic for fluid accumulation.  Reports feeling well at this time and voices no complaints.  Sister has been dx with stage 4 cancer.     CorVue thoracic impedance suggesting normal fluid levels.   Prescribed: Furosemide 40 mg take 1 tablet (40 mg total) by mouth daily as needed Farxiga 50 mg take 1 tablet daily Xarelto 20 mg take 1 tablet at supper daily   Labs: 07/21/2022 Creatinine 1.19, BUN 25, Potassium 4.3, Sodium 138, GFR 59.72 04/20/2022 Creatinine 1.34, BUN 28, Potassium 4.7, Sodium 140, GFR 55 04/06/2022 Creatinine 1.55, BUN 34, Potassium 4.3, Sodium 139 04/05/2022 Creatinine 1.54, BUN 32, Potassium 4.2, Sodium 140, GFR 47 04/01/2022 Creatinine 1.34, BUN 23, Potassium 4.8, Sodium 139, GFR 55 03/12/2022 Creatinine 1.22, BUN 21, Potassium 4.6, Sodium 141, GFR 62 02/11/2022 Creatinine 1.40, BUN 21, Potassium 4.3, Sodium 141 A complete set of results can be found in Results Review.   Recommendations:  Left voice mail with ICM number and encouraged to call if experiencing any fluid symptoms.   Follow-up plan: ICM clinic phone appointment on 11/29/2022.   91 day device clinic remote transmission 11/16/2022.   EP/Cardiology Office Visits:   11/17/2022 with Dr Myles Gip.  11/05/2022 with Dr Claiborne Billings   Copy of ICM check sent to Dr. Myles Gip.   3 month ICM trend: 10/18/2022.    12-14 Month ICM trend:     Rosalene Billings,  RN 10/22/2022 12:44 PM

## 2022-10-28 LAB — MICROALBUMIN / CREATININE URINE RATIO: Microalb Creat Ratio: 170

## 2022-10-28 LAB — PROTEIN / CREATININE RATIO, URINE
Albumin, U: 116.8
Creatinine, Urine: 68.9

## 2022-10-28 LAB — HEMOGLOBIN A1C: Hemoglobin A1C: 7.1

## 2022-10-29 ENCOUNTER — Encounter: Payer: Self-pay | Admitting: Endocrinology

## 2022-10-31 ENCOUNTER — Other Ambulatory Visit: Payer: Self-pay | Admitting: Cardiovascular Disease

## 2022-11-04 ENCOUNTER — Encounter: Payer: Self-pay | Admitting: Endocrinology

## 2022-11-04 ENCOUNTER — Telehealth: Payer: Self-pay | Admitting: Endocrinology

## 2022-11-04 ENCOUNTER — Ambulatory Visit (INDEPENDENT_AMBULATORY_CARE_PROVIDER_SITE_OTHER): Payer: Medicare Other | Admitting: Endocrinology

## 2022-11-04 VITALS — BP 108/62 | HR 69 | Ht 69.0 in | Wt 282.0 lb

## 2022-11-04 DIAGNOSIS — Z794 Long term (current) use of insulin: Secondary | ICD-10-CM

## 2022-11-04 DIAGNOSIS — I10 Essential (primary) hypertension: Secondary | ICD-10-CM | POA: Diagnosis not present

## 2022-11-04 DIAGNOSIS — I255 Ischemic cardiomyopathy: Secondary | ICD-10-CM

## 2022-11-04 DIAGNOSIS — E1165 Type 2 diabetes mellitus with hyperglycemia: Secondary | ICD-10-CM | POA: Diagnosis not present

## 2022-11-04 DIAGNOSIS — E782 Mixed hyperlipidemia: Secondary | ICD-10-CM | POA: Diagnosis not present

## 2022-11-04 NOTE — Telephone Encounter (Signed)
After patients visit wife came back into the office and wanted lab orders for next visit to be printer and given to them so that they have the orders to take to the PCP office for lab draw.  Labs not entered as of this request, asked provider and was advised that he will put orders in and have them faxed to the PCP office. Mrs Charles [name] objected to this method and has stated that they MUST be available for pick up when she comes for her visit with provider on 11/30/22.Does not want them faxed because "this never works out" (noting wife's record as well)

## 2022-11-04 NOTE — Patient Instructions (Addendum)
Am Novolog 20 for cereal  Supper dose 12 -15 Novolog

## 2022-11-04 NOTE — Telephone Encounter (Signed)
Orders are printed and released. Awaiting pickup
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
## 2022-11-05 ENCOUNTER — Ambulatory Visit: Payer: Medicare Other | Admitting: Cardiovascular Disease

## 2022-11-08 ENCOUNTER — Encounter: Payer: Self-pay | Admitting: Cardiovascular Disease

## 2022-11-08 ENCOUNTER — Other Ambulatory Visit: Payer: Self-pay | Admitting: Cardiovascular Disease

## 2022-11-08 ENCOUNTER — Ambulatory Visit: Payer: Medicare Other | Attending: Cardiovascular Disease | Admitting: Cardiovascular Disease

## 2022-11-08 DIAGNOSIS — Z951 Presence of aortocoronary bypass graft: Secondary | ICD-10-CM

## 2022-11-08 DIAGNOSIS — Z794 Long term (current) use of insulin: Secondary | ICD-10-CM | POA: Insufficient documentation

## 2022-11-08 DIAGNOSIS — G4733 Obstructive sleep apnea (adult) (pediatric): Secondary | ICD-10-CM

## 2022-11-08 DIAGNOSIS — I2510 Atherosclerotic heart disease of native coronary artery without angina pectoris: Secondary | ICD-10-CM

## 2022-11-08 DIAGNOSIS — I255 Ischemic cardiomyopathy: Secondary | ICD-10-CM

## 2022-11-08 DIAGNOSIS — Z950 Presence of cardiac pacemaker: Secondary | ICD-10-CM

## 2022-11-08 DIAGNOSIS — E1159 Type 2 diabetes mellitus with other circulatory complications: Secondary | ICD-10-CM | POA: Diagnosis present

## 2022-11-08 DIAGNOSIS — I495 Sick sinus syndrome: Secondary | ICD-10-CM

## 2022-11-08 DIAGNOSIS — D6869 Other thrombophilia: Secondary | ICD-10-CM | POA: Diagnosis not present

## 2022-11-08 NOTE — Patient Instructions (Addendum)
Medication Instructions:  Your physician recommends that you continue on your current medications as directed. Please refer to the Current Medication list given to you today.  *If you need a refill on your cardiac medications before your next appointment, please call your pharmacy*   Lab Work: NONE ordered at this time of appointment   If you have labs (blood work) drawn today and your tests are completely normal, you will receive your results only by: [place] (if you have MyChart) OR A paper copy in the mail If you have any lab test that is abnormal or we need to change your treatment, we will call you to review the results.   Testing/Procedures: BPAP Titration Study    Follow-Up: At [facility], you and your health needs are our priority.  As part of our continuing mission to provide you with exceptional heart care, we have created designated Provider Care Teams.  These Care Teams include your primary Cardiologist (physician) and Advanced Practice Providers (APPs -  Physician Assistants and Nurse Practitioners) who all work together to provide you with the care you need, when you need it.  We recommend signing up for the patient portal called "MyChart".  Sign up information is provided on this After Visit Summary.  MyChart is used to connect with patients for Virtual Visits (Telemedicine).  Patients are able to view lab/test results, encounter notes, upcoming appointments, etc.  Non-urgent messages can be sent to your provider as well.   To learn more about what you can do with MyChart, go to [email or web address].    Your next appointment:   4-5 month(s)  The format for your next appointment:   In Person  Provider:   [facility]  Other Instructions   Important Information About Sugar

## 2022-11-08 NOTE — Progress Notes (Signed)
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 (independently read by me):   Atrial paced at 71, prolonged AV conduction PR 250 msec; [facility]  June 07, 2018 ECG (independently read by me): Atrially paced rhythm at 70 bpm with prolonged AV conduction with PR interval at 238.  Left bundle branch block.  May 2019 ECG (independently read by me): Atrially paced rhythm at 76 bpm with prolonged AV conduction with a PR interval 236 ms.  Left bundle branch block.  January 2019 ECG (independently read by me): Atrial paced rhythm at 70 bpm.  Prolonged AV conduction with a PR interval 248.  Left bundle branch block.  QTc interval 494 ms.  April 2018 ECG (independently read by me): Atrially paced rhythm at 74 bpm, prolonged A-V conduction with a PR 246.    ECG (independently read by me): Atrial paced rhythm at 76 bpm alternating with atrial sensing in a bigeminal pattern.  There is left bundle branch block.  Recent Labs:    Latest Ref Rng & Units 07/21/2022   10:17 AM 04/20/2022    9:55 AM 04/06/2022    2:00 PM  BMP  Glucose 70 - 99 mg/dL 104  158  172   BUN 6 - 23 mg/dL 25  28  34   Creatinine 0.40 - 1.50 mg/dL 1.19  1.34  1.55   BUN/Creat Ratio 10 - 24  21    Sodium 135 - 145 mEq/L 138  140  139   Potassium 3.5 - 5.1 mEq/L 4.3  4.7  4.3   Chloride 96 - 112 mEq/L 107  104  104   CO2 19 - 32 mEq/L _0 Calcium 8.4 - 10.5 mg/dL 8.7  9.1  9.2         Latest Ref Rng & Units 07/21/2022   10:17 AM 04/06/2022    2:00 PM 03/12/2022    9:20 AM  Hepatic Function  Total Protein 6.0 - 8.3 g/dL 7.1  7.4  7.2   Albumin 3.5 - 5.2 g/dL 3.8  4.2  4.2   AST 0 - 37 U/L _1 ALT 0 - 53 U/L 16  32  16   Alk Phosphatase 39 - 117 U/L 70  62  78   Total Bilirubin 0.2 - 1.2 mg/dL 0.5  0.6  0.3        Latest Ref Rng & Units 04/06/2022    2:00 PM 03/12/2022    9:20 AM 02/11/2022   12:10 PM  CBC  WBC 4.0 - 10.5 K/uL 6.7  6.9  6.0   Hemoglobin 13.0 - 17.0 g/dL 14.2  14.9  13.6   Hematocrit 39.0 - 52.0 % 44.8  45.4  43.3   Platelets 150 - 400 K/uL 187  223  187    Lab  Results  Component Value Date   MCV 92.9 04/06/2022   MCV 91 03/12/2022   MCV 96.4 02/11/2022   Lab Results  Component Value Date   TSH 1.930 03/12/2022   Lab Results  Component Value Date   HGBA1C 7.1 10/28/2022     BNP    Component Value Date/Time   BNP 361.5 (H) 04/06/2022 1400   BNP 77.0 07/03/2013 1604    ProBNP    Component Value Date/Time   PROBNP 739 (H) 08/07/2018 0912   PROBNP 364.0 (H) 10/21/2015 0932     Lipid Panel     Component Value Date/Time   CHOL 170 07/21/2022 1017   TRIG 124.0 07/21/2022 1017   HDL 44.20 07/21/2022 1017   CHOLHDL 4 07/21/2022 1017   VLDL 24.8 07/21/2022 1017   LDLCALC 101 (H) 07/21/2022 1017   LDLDIRECT 108 (H) 03/12/2022 0920     RADIOLOGY: No results found.   Additional studies/ records that were reviewed today include:  I reviewed the patient's hospitalizations, office visits evaluations, notes from Dr. Servando Snare, laboratory, and  catheterization data, I reviewed his echo Doppler study as well as his recent evaluation from [facility]. Recent hospitalization for ICD implant. A new download was obtained of his CPAP unit from October 17 through October 07, 2019.  EP records were reviewed.  Download was obtained from April 14 through Apr 03, 2021 which shows excellent compliance with average use at 9 hours and 16 minutes.  There is significant mask leak.  AutoSet pressure range was from 16 to 20 cm.  AHI 13.2.  A new download was obtained from January 22 through January 11, 2022 as noted above  ASSESSMENT:    1. CAD in native artery   2. S/P CABG x 4   3. Secondary hypercoagulable state ([place])   4. Sick sinus syndrome (HCC)   5. Cardiac pacemaker in situ   6. OSA on CPAP   7. Morbid obesity ([place])   8. Type 2 diabetes mellitus with other circulatory complication, with long-term current use of insulin (HCC)     PLAN:  Charles [name] is a 76 year-old gentleman who had previously undergone multiple coronary  interventions since 1995 for significant coronary obstructive disease.  Due to progressive in-stent restenosis Charles [name] underwent successful CABG surgery 4 by Dr. Servando Snare on 09/15/2016.  Charles [name] has a history of PAF and was maintaining sinus rhythm with atrial pacing.  Charles [name] is on Xarelto for anticoagulation.   His pacemaker is followed by Dr. [name] and in September 2020 had implantation/upgrade of a dual chamber PPM to CRT-P on 08/14/2019 by Dr [name]. and received a Economist model [id number] (serial number [id number]) lead, Barnard MP model [id number] (serial  Number [id number]) biventricular pacemaker.  His most echo on August 13, 2019 revealed an EF of 35 to 40% with mild LVH grade 1 diastolic dysfunction, and moderate mitral regurgitation.  Most recently, Charles [name] is now on Entresto 24/26 mg twice a day, Farxiga 2.5 mg twice a day, carvedilol 6.25 mg twice a day, and as needed furosemide.  His echo study from May 07, 2021 demonstrated normalization of LV function with EF at 55 to 60%.  There were no wall motion abnormalities.  There was grade 1 diastolic dysfunction.  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  Charles [name] was last evaluated in A-fib clinic in September 2023 and was maintaining sinus rhythm and  Charles [name] was maintained on carvedilol 12.5 mg twice a day and Xarelto 20 mg daily.  When last seen by me in February 2023 Charles [name] was in atrial fibrillation.  At that time I recommended Charles [name] titrate carvedilol from 6.25 mg twice a day to 12.5 mg twice a day.  Follow-up EP evaluation was recommended.  His blood pressure today is stable and rechecked by me was 116/76 on his regimen of carvedilol 12.5 mg twice a day, Entresto 49/51 mg twice a day, Farxiga 5 mg, furosemide 40 mg daily.  Charles [name] is diabetic on Farxiga in addition to NovoLog and [employer].  Charles [name] is statin intolerant and has been on Zetia 10 mg for hyperlipidemia.  Target LDL is less than 55.   Charles [name] had undergone laboratory at [facility].  If unable to reach target Charles [name] may be a candidate for Repatha or or alternatively a trial of bempedoic acid combined with Zetia.  Charles [name] has continued to use CPAP.  I reviewed his most recent download which I obtained today Charles [name] is meeting compliance standards with average use at 8 hours and 50 minutes.  However AHI is elevated at 25.6 although Charles [name] has significant mask leak.  I am scheduling him for a BiPAP titration study.  Charles [name] continues to be morbidly obese with a BMI at 41.  Weight loss and increased exercise was recommended.  I will see him in 4 to 5 months for follow-up evaluation or sooner as needed.   Medication Adjustments/Labs and Tests Ordered: Current medicines are reviewed at length with the patient today.  Concerns regarding medicines are outlined above.  Medication changes, Labs and Tests ordered today are listed in the Patient Instructions below. Patient Instructions  Medication Instructions:  Your physician recommends that you continue on your current medications as directed. Please refer to the Current Medication list given to you today.  *If you need a refill on your cardiac medications before your next appointment, please call your pharmacy*   Lab Work: NONE ordered at this time of appointment   If you have labs (blood work) drawn today and your tests are completely normal, you will receive your results only by: [facility] (if you have MyChart) OR A paper copy in the mail If you have any lab test that is abnormal or we need to change your treatment, we will call you to review the results.   Testing/Procedures: BPAP Titration Study    Follow-Up: At [facility], you and your health needs are our priority.  As part of our continuing mission to provide you with exceptional heart care, we have created designated Provider Care Teams.  These Care Teams include your primary Cardiologist (physician) and Advanced Practice  Providers (APPs -  Physician Assistants and Nurse Practitioners) who all work together to provide you with the care you need, when you need it.  We recommend signing up for the patient portal called "MyChart".  Sign up information is provided on this After Visit Summary.  MyChart is used to connect with patients for Virtual Visits (Telemedicine).  Patients are able to view lab/test results, encounter notes, upcoming appointments, etc.  Non-urgent messages can be sent to your provider as well.   To learn more about what you can do with MyChart, go to [email or web address].    Your next appointment:   4-5 month(s)  The format for your next appointment:   In Person  Provider:   [facility]  Other Instructions   Important Information About Sugar  Signed, Shelva Majestic, MD  11/12/2022 9:58 AM    [facility] [address], [place], [place], [place]  [address] Phone: [phone]

## 2022-11-09 ENCOUNTER — Encounter: Payer: Self-pay | Admitting: Endocrinology

## 2022-11-12 ENCOUNTER — Encounter: Payer: Self-pay | Admitting: Cardiovascular Disease

## 2022-11-16 ENCOUNTER — Ambulatory Visit (INDEPENDENT_AMBULATORY_CARE_PROVIDER_SITE_OTHER): Payer: Medicare Other

## 2022-11-16 DIAGNOSIS — I495 Sick sinus syndrome: Secondary | ICD-10-CM | POA: Diagnosis not present

## 2022-11-16 LAB — CUP PACEART REMOTE DEVICE CHECK
Battery Remaining Longevity: 56 mo
Battery Remaining Percentage: 57 %
Battery Voltage: 2.98 V
Brady Statistic AP VP Percent: 88 %
Brady Statistic AP VS Percent: 1 %
Brady Statistic AS VP Percent: 8.6 %
Brady Statistic AS VS Percent: 1.8 %
Brady Statistic RA Percent Paced: 77 %
Date Time Interrogation Session: 20231226020013
Implantable Lead Connection Status: 753985
Implantable Lead Connection Status: 753985
Implantable Lead Connection Status: 753985
Implantable Lead Implant Date: 20091007
Implantable Lead Implant Date: 20091007
Implantable Lead Implant Date: 20200922
Implantable Lead Location: 753858
Implantable Lead Location: 753859
Implantable Lead Location: 753860
Implantable Pulse Generator Implant Date: 20200922
Lead Channel Impedance Value: 1275 Ohm
Lead Channel Impedance Value: 450 Ohm
Lead Channel Impedance Value: 640 Ohm
Lead Channel Pacing Threshold Amplitude: 0.625 V
Lead Channel Pacing Threshold Amplitude: 0.75 V
Lead Channel Pacing Threshold Amplitude: 1.375 V
Lead Channel Pacing Threshold Pulse Width: 0.4 ms
Lead Channel Pacing Threshold Pulse Width: 0.4 ms
Lead Channel Pacing Threshold Pulse Width: 0.5 ms
Lead Channel Sensing Intrinsic Amplitude: 12 mV
Lead Channel Sensing Intrinsic Amplitude: 4.4 mV
Lead Channel Setting Pacing Amplitude: 1.75 V
Lead Channel Setting Pacing Amplitude: 2 V
Lead Channel Setting Pacing Amplitude: 2.375
Lead Channel Setting Pacing Pulse Width: 0.4 ms
Lead Channel Setting Pacing Pulse Width: 0.5 ms
Lead Channel Setting Sensing Sensitivity: 2 mV
Pulse Gen Model: 3562
Pulse Gen Serial Number: 9157656

## 2022-11-17 ENCOUNTER — Ambulatory Visit: Payer: Medicare Other | Admitting: Cardiovascular Disease

## 2022-11-25 ENCOUNTER — Telehealth: Payer: Self-pay | Admitting: Cardiovascular Disease

## 2022-11-25 LAB — NMR, LIPOPROFILE
Cholesterol, Total: 151 mg/dL (ref 100–199)
HDL Particle Number: 28.5 umol/L — ABNORMAL LOW (ref >=30.5)
HDL-C: 49 mg/dL (ref >39)
LDL Particle Number: 672 nmol/L (ref <1000)
LDL Size: 20.3 nm — ABNORMAL LOW (ref >20.5)
LDL-C (NIH Calc): 78 mg/dL (ref 0–99)
LP-IR Score: 45 (ref <=45)
Small LDL Particle Number: 372 nmol/L (ref <=527)
Triglycerides: 134 mg/dL (ref 0–149)

## 2022-11-25 LAB — LIPOPROTEIN A (LPA): Lipoprotein (a): 23.1 nmol/L (ref <75.0)

## 2022-11-25 NOTE — Telephone Encounter (Signed)
Charles [name], CMA  You2 minutes ago (2:38 PM)   [employer] He needs to contact the sleep lab. [phone]. They handle all scheduling.   Returned call to patient and made him aware of number to call and reschedule. Advised patient to call back to office with any issues, questions, or concerns. Patient verbalized understanding.

## 2022-11-25 NOTE — Telephone Encounter (Signed)
Patient stated he is sick and will need to reschedule his BIPAP TITRATION test scheduled on 1/16.

## 2022-11-29 ENCOUNTER — Encounter: Payer: Self-pay | Admitting: Internal Medicine

## 2022-11-29 ENCOUNTER — Ambulatory Visit (INDEPENDENT_AMBULATORY_CARE_PROVIDER_SITE_OTHER): Payer: Medicare Other

## 2022-11-29 DIAGNOSIS — Z950 Presence of cardiac pacemaker: Secondary | ICD-10-CM | POA: Diagnosis not present

## 2022-11-29 DIAGNOSIS — I5022 Chronic systolic (congestive) heart failure: Secondary | ICD-10-CM

## 2022-12-02 ENCOUNTER — Telehealth: Payer: Self-pay

## 2022-12-02 NOTE — Telephone Encounter (Signed)
Remote ICM transmission received.  Attempted call to patient regarding ICM remote transmission and left detailed message per DPR.  Advised to return call for any fluid symptoms or questions. Next ICM remote transmission scheduled 01/03/2023.

## 2022-12-02 NOTE — Progress Notes (Signed)
EPIC Encounter for ICM Monitoring  Patient Name: Charles [name] is a 77 y.o. male Date: 12/02/2022 Primary Care Physican: Cyndi Bender, PA-C Primary Cardiologist: Claiborne Billings Electrophysiologist: Mealor Bi-V Pacing: 91%  11/27/2020 Weight: 286.4 lbs  03/17/2022 Weight: 281 lbs 05/14/2022 Office Weight: 283 lbs 08/13/2022 Weight: 279 lbs 10/22/2022 Weigh: 277.1 lbs   AT/AF Burden: 8.6% (taking Xarelto)                                                          Attempted call to patient and unable to reach.  Left detailed message per DPR regarding transmission. Transmission reviewed.     CorVue thoracic impedance suggesting normal fluid levels with exception of possible dryness 12/29-1/7 and possible fluid accumulation from 12/11-12/18.   Prescribed: Furosemide 40 mg take 1 tablet (40 mg total) by mouth daily as needed Farxiga 50 mg take 1 tablet daily Xarelto 20 mg take 1 tablet at supper daily   Labs: 07/21/2022 Creatinine 1.19, BUN 25, Potassium 4.3, Sodium 138, GFR 59.72 04/20/2022 Creatinine 1.34, BUN 28, Potassium 4.7, Sodium 140, GFR 55 04/06/2022 Creatinine 1.55, BUN 34, Potassium 4.3, Sodium 139 04/05/2022 Creatinine 1.54, BUN 32, Potassium 4.2, Sodium 140, GFR 47 04/01/2022 Creatinine 1.34, BUN 23, Potassium 4.8, Sodium 139, GFR 55 03/12/2022 Creatinine 1.22, BUN 21, Potassium 4.6, Sodium 141, GFR 62 02/11/2022 Creatinine 1.40, BUN 21, Potassium 4.3, Sodium 141 A complete set of results can be found in Results Review.   Recommendations:  Left voice mail with ICM number and encouraged to call if experiencing any fluid symptoms.   Follow-up plan: ICM clinic phone appointment on 01/03/2023.   91 day device clinic remote transmission 02/15/2023.   EP/Cardiology Office Visits:   12/06/2022 with Dr Myles Gip.  03/07/2023 with Dr Claiborne Billings   Copy of ICM check sent to Dr. Myles Gip.    3 month ICM trend: 12/02/2022.    12-14 Month ICM trend:     Rosalene Billings, RN 12/02/2022 7:37 AM

## 2022-12-06 ENCOUNTER — Ambulatory Visit: Payer: Medicare Other | Attending: Cardiovascular Disease | Admitting: Cardiovascular Disease

## 2022-12-06 ENCOUNTER — Encounter: Payer: Self-pay | Admitting: Cardiovascular Disease

## 2022-12-06 VITALS — BP 132/84 | HR 73 | Ht 69.0 in | Wt 270.4 lb

## 2022-12-06 DIAGNOSIS — Z950 Presence of cardiac pacemaker: Secondary | ICD-10-CM

## 2022-12-06 DIAGNOSIS — I480 Paroxysmal atrial fibrillation: Secondary | ICD-10-CM | POA: Diagnosis not present

## 2022-12-06 DIAGNOSIS — I5042 Chronic combined systolic (congestive) and diastolic (congestive) heart failure: Secondary | ICD-10-CM | POA: Diagnosis not present

## 2022-12-06 DIAGNOSIS — I5022 Chronic systolic (congestive) heart failure: Secondary | ICD-10-CM | POA: Diagnosis not present

## 2022-12-06 DIAGNOSIS — I495 Sick sinus syndrome: Secondary | ICD-10-CM | POA: Diagnosis present

## 2022-12-06 NOTE — Patient Instructions (Signed)
Medication Instructions:  Your physician recommends that you continue on your current medications as directed. Please refer to the Current Medication list given to you today.  *If you need a refill on your cardiac medications before your next appointment, please call your pharmacy*  Lab Work: None ordered  Testing/Procedures: None ordered  Follow-Up: At [facility], you and your health needs are our priority.  As part of our continuing mission to provide you with exceptional heart care, we have created designated Provider Care Teams.  These Care Teams include your primary Cardiologist (physician) and Advanced Practice Providers (APPs -  Physician Assistants and Nurse Practitioners) who all work together to provide you with the care you need, when you need it.   Your next appointment:   1 year(s)  The format for your next appointment:   In Person  Provider:   You will see one of the following Advanced Practice Providers on your designated Care Team:   Renee Ursuy, PA-C Michael "Andy" Tillery, PA-C Suzann Riddle, NP  
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
## 2022-12-07 ENCOUNTER — Encounter (HOSPITAL_BASED_OUTPATIENT_CLINIC_OR_DEPARTMENT_OTHER): Payer: Medicare Other | Admitting: Cardiovascular Disease

## 2022-12-09 NOTE — Progress Notes (Signed)
Remote pacemaker transmission.   

## 2022-12-10 ENCOUNTER — Other Ambulatory Visit: Payer: Self-pay | Admitting: Cardiovascular Disease

## 2022-12-14 ENCOUNTER — Encounter: Payer: Self-pay | Admitting: Cardiovascular Disease

## 2023-01-03 ENCOUNTER — Encounter: Payer: Self-pay | Admitting: Gastroenterology

## 2023-01-03 ENCOUNTER — Ambulatory Visit (INDEPENDENT_AMBULATORY_CARE_PROVIDER_SITE_OTHER): Payer: Medicare Other | Admitting: Gastroenterology

## 2023-01-03 ENCOUNTER — Ambulatory Visit: Payer: Medicare Other

## 2023-01-03 VITALS — BP 116/68 | HR 72 | Ht 69.0 in | Wt 271.0 lb

## 2023-01-03 DIAGNOSIS — K5900 Constipation, unspecified: Secondary | ICD-10-CM | POA: Diagnosis not present

## 2023-01-03 DIAGNOSIS — Z950 Presence of cardiac pacemaker: Secondary | ICD-10-CM | POA: Diagnosis not present

## 2023-01-03 DIAGNOSIS — I5042 Chronic combined systolic (congestive) and diastolic (congestive) heart failure: Secondary | ICD-10-CM

## 2023-01-03 HISTORY — DX: Constipation, unspecified: K59.00
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
## 2023-01-03 NOTE — Patient Instructions (Signed)
_______________________________________________________  If your blood pressure at your visit was 140/90 or greater, please contact your primary care physician to follow up on this.  _______________________________________________________  If you are age 76 or older, your body mass index should be between 23-30. Your Body mass index is 40.02 kg/m. If this is out of the aforementioned range listed, please consider follow up with your Primary Care Provider.  If you are age 47 or younger, your body mass index should be between 19-25. Your Body mass index is 40.02 kg/m. If this is out of the aformentioned range listed, please consider follow up with your Primary Care Provider.   ________________________________________________________  The [place] [facility] providers would like to encourage you to use [facility] to communicate with providers for non-urgent requests or questions.  Due to long hold times on the telephone, sending your provider a message by [facility] may be a faster and more efficient way to get a response.  Please allow 48 business hours for a response.  Please remember that this is for non-urgent requests.  _______________________________________________________  Start Miralax 1 capful daily in 8 ounces of liquid.  Start Benefiber 2 teaspoons in 8 ounces of liquid daily.  Call back in 3-4 weeks.

## 2023-01-06 ENCOUNTER — Telehealth: Payer: Self-pay

## 2023-01-06 NOTE — Progress Notes (Signed)
EPIC Encounter for ICM Monitoring  Patient Name: Charles [name] is a 77 y.o. male Date: 01/06/2023 Primary Care Physican: Cyndi Bender, PA-C Primary Cardiologist: Claiborne Billings Electrophysiologist: Mealor Bi-V Pacing: 92%  11/27/2020 Weight: 286.4 lbs  03/17/2022 Weight: 281 lbs 05/14/2022 Office Weight: 283 lbs 08/13/2022 Weight: 279 lbs 10/22/2022 Weigh: 277.1 lbs   AT/AF Burden: 7.5% (taking Xarelto)                                                          Attempted call to patient and unable to reach.  Left detailed message per DPR regarding transmission. Transmission reviewed.     CorVue thoracic impedance suggesting normal fluid levels with exception of possible fluid accumulation from 1/26-1/29 and 1/31-2/4.   Prescribed: Furosemide 40 mg take 1 tablet (40 mg total) by mouth daily as needed Farxiga 50 mg take 1 tablet daily Xarelto 20 mg take 1 tablet at supper daily   Labs: 07/21/2022 Creatinine 1.19, BUN 25, Potassium 4.3, Sodium 138, GFR 59.72 04/20/2022 Creatinine 1.34, BUN 28, Potassium 4.7, Sodium 140, GFR 55 04/06/2022 Creatinine 1.55, BUN 34, Potassium 4.3, Sodium 139 04/05/2022 Creatinine 1.54, BUN 32, Potassium 4.2, Sodium 140, GFR 47 04/01/2022 Creatinine 1.34, BUN 23, Potassium 4.8, Sodium 139, GFR 55 03/12/2022 Creatinine 1.22, BUN 21, Potassium 4.6, Sodium 141, GFR 62 02/11/2022 Creatinine 1.40, BUN 21, Potassium 4.3, Sodium 141 A complete set of results can be found in Results Review.   Recommendations:  Left voice mail with ICM number and encouraged to call if experiencing any fluid symptoms.   Follow-up plan: ICM clinic phone appointment on 02/07/2023.   91 day device clinic remote transmission 02/15/2023.   EP/Cardiology Office Visits:    03/07/2023 with Dr Claiborne Billings   Copy of ICM check sent to Dr. Myles Gip.    3 month ICM trend: 01/03/2023.    12-14 Month ICM trend:     Rosalene Billings, RN 01/06/2023 12:38 PM

## 2023-01-06 NOTE — Telephone Encounter (Signed)
Remote ICM transmission received.  Attempted call to patient regarding ICM remote transmission and left detailed message per DPR.  Advised to return call for any fluid symptoms or questions. Next ICM remote transmission scheduled 02/07/2023.

## 2023-01-19 ENCOUNTER — Telehealth: Payer: Self-pay

## 2023-01-19 NOTE — Telephone Encounter (Signed)
Pts spouse will be coming to the office to pick up lab orders

## 2023-01-19 NOTE — Telephone Encounter (Signed)
No note needed 

## 2023-01-27 ENCOUNTER — Ambulatory Visit: Payer: Medicare Other | Admitting: Endocrinology

## 2023-02-01 LAB — COMPREHENSIVE METABOLIC PANEL
ALT: 23 IU/L (ref 0–44)
AST: 16 IU/L (ref 0–40)
Albumin/Globulin Ratio: 1.6 (ref 1.2–2.2)
Albumin: 4.2 g/dL (ref 3.8–4.8)
Alkaline Phosphatase: 77 IU/L (ref 44–121)
BUN/Creatinine Ratio: 17 (ref 10–24)
BUN: 19 mg/dL (ref 8–27)
Bilirubin Total: 0.4 mg/dL (ref 0.0–1.2)
CO2: 25 mmol/L (ref 20–29)
Calcium: 9.1 mg/dL (ref 8.6–10.2)
Chloride: 104 mmol/L (ref 96–106)
Creatinine, Ser: 1.14 mg/dL (ref 0.76–1.27)
Globulin, Total: 2.7 g/dL (ref 1.5–4.5)
Glucose: 98 mg/dL (ref 70–99)
Potassium: 5.1 mmol/L (ref 3.5–5.2)
Sodium: 143 mmol/L (ref 134–144)
Total Protein: 6.9 g/dL (ref 6.0–8.5)
eGFR: 67 mL/min/{1.73_m2} (ref >59)

## 2023-02-01 LAB — LDL CHOLESTEROL, DIRECT: LDL Direct: 76 mg/dL (ref 0–99)

## 2023-02-01 LAB — HEMOGLOBIN A1C
Est. average glucose Bld gHb Est-mCnc: 160 mg/dL
Hgb A1c MFr Bld: 7.2 % — ABNORMAL HIGH (ref 4.8–5.6)

## 2023-02-03 ENCOUNTER — Ambulatory Visit: Payer: Medicare Other | Admitting: Endocrinology

## 2023-02-07 ENCOUNTER — Ambulatory Visit: Payer: Medicare Other | Attending: Cardiovascular Disease

## 2023-02-07 DIAGNOSIS — I5042 Chronic combined systolic (congestive) and diastolic (congestive) heart failure: Secondary | ICD-10-CM | POA: Diagnosis not present

## 2023-02-07 DIAGNOSIS — Z950 Presence of cardiac pacemaker: Secondary | ICD-10-CM

## 2023-02-08 ENCOUNTER — Encounter: Payer: Self-pay | Admitting: Endocrinology

## 2023-02-08 ENCOUNTER — Ambulatory Visit (INDEPENDENT_AMBULATORY_CARE_PROVIDER_SITE_OTHER): Payer: Medicare Other | Admitting: Endocrinology

## 2023-02-08 VITALS — BP 142/90 | HR 84 | Ht 69.0 in | Wt 288.2 lb

## 2023-02-08 DIAGNOSIS — Z794 Long term (current) use of insulin: Secondary | ICD-10-CM | POA: Diagnosis not present

## 2023-02-08 DIAGNOSIS — E1165 Type 2 diabetes mellitus with hyperglycemia: Secondary | ICD-10-CM

## 2023-02-08 DIAGNOSIS — E782 Mixed hyperlipidemia: Secondary | ICD-10-CM | POA: Diagnosis not present
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
## 2023-02-08 NOTE — Patient Instructions (Signed)
Max dose Novolog in am is 22 units

## 2023-02-10 ENCOUNTER — Encounter: Payer: Self-pay | Admitting: Endocrinology

## 2023-02-11 NOTE — Progress Notes (Signed)
EPIC Encounter for ICM Monitoring  Patient Name: Charles [name] is a 77 y.o. male Date: 02/11/2023 Primary Care Physican: Cyndi Bender, PA-C Primary Cardiologist: Claiborne Billings Electrophysiologist: Mealor Bi-V Pacing: 93%  08/13/2022 Weight: 279 lbs 10/22/2022 Weigh: 277.1 lbs   AT/AF Burden: 6.9% (taking Xarelto)                                                          Spoke with patient and heart failure questions reviewed.  Transmission results reviewed.  Pt asymptomatic for fluid accumulation.  Reports feeling well at this time and voices no complaints.     CorVue thoracic impedance suggesting normal fluid levels with exception of possible fluid accumulation from 3/14-3/17 and 3/3-3/7.   Prescribed: Furosemide 40 mg take 1 tablet (40 mg total) by mouth daily as needed Farxiga 50 mg take 1 tablet daily Xarelto 20 mg take 1 tablet at supper daily   Labs: 02/03/2023 Creatinine 1.14, BUN 19, Potassium 5.1, Sodium 143, GFR 67 07/21/2022 Creatinine 1.19, BUN 25, Potassium 4.3, Sodium 138, GFR 59.72 04/20/2022 Creatinine 1.34, BUN 28, Potassium 4.7, Sodium 140, GFR 55 A complete set of results can be found in Results Review.   Recommendations:  No changes and encouraged to call if experiencing any fluid symptoms.   Follow-up plan: ICM clinic phone appointment on 03/14/2023.   91 day device clinic remote transmission 02/15/2023.   EP/Cardiology Office Visits:  03/07/2023 with Dr Claiborne Billings   Copy of ICM check sent to Dr. Myles Gip.     Direct Trend through 3/21   3 month ICM trend: 02/07/2023.    12-14 Month ICM trend:     Rosalene Billings, RN 02/11/2023 2:58 PM

## 2023-02-14 LAB — CUP PACEART REMOTE DEVICE CHECK
Battery Remaining Longevity: 53 mo
Battery Remaining Percentage: 54 %
Battery Voltage: 2.98 V
Brady Statistic AP VP Percent: 90 %
Brady Statistic AP VS Percent: 1 %
Brady Statistic AS VP Percent: 7.4 %
Brady Statistic AS VS Percent: 1.7 %
Brady Statistic RA Percent Paced: 80 %
Date Time Interrogation Session: 20240325020009
Implantable Lead Connection Status: 753985
Implantable Lead Connection Status: 753985
Implantable Lead Connection Status: 753985
Implantable Lead Implant Date: 20091007
Implantable Lead Implant Date: 20091007
Implantable Lead Implant Date: 20200922
Implantable Lead Location: 753858
Implantable Lead Location: 753859
Implantable Lead Location: 753860
Implantable Pulse Generator Implant Date: 20200922
Lead Channel Impedance Value: 1225 Ohm
Lead Channel Impedance Value: 440 Ohm
Lead Channel Impedance Value: 640 Ohm
Lead Channel Pacing Threshold Amplitude: 0.5 V
Lead Channel Pacing Threshold Amplitude: 0.75 V
Lead Channel Pacing Threshold Amplitude: 1.375 V
Lead Channel Pacing Threshold Pulse Width: 0.4 ms
Lead Channel Pacing Threshold Pulse Width: 0.4 ms
Lead Channel Pacing Threshold Pulse Width: 0.5 ms
Lead Channel Sensing Intrinsic Amplitude: 1.6 mV
Lead Channel Sensing Intrinsic Amplitude: 9.7 mV
Lead Channel Setting Pacing Amplitude: 1.75 V
Lead Channel Setting Pacing Amplitude: 2 V
Lead Channel Setting Pacing Amplitude: 2.375
Lead Channel Setting Pacing Pulse Width: 0.4 ms
Lead Channel Setting Pacing Pulse Width: 0.5 ms
Lead Channel Setting Sensing Sensitivity: 2 mV
Pulse Gen Model: 3562
Pulse Gen Serial Number: 9157656

## 2023-02-15 ENCOUNTER — Ambulatory Visit (INDEPENDENT_AMBULATORY_CARE_PROVIDER_SITE_OTHER): Payer: Medicare Other

## 2023-02-15 DIAGNOSIS — I495 Sick sinus syndrome: Secondary | ICD-10-CM | POA: Diagnosis not present

## 2023-03-06 ENCOUNTER — Other Ambulatory Visit: Payer: Self-pay | Admitting: Endocrinology

## 2023-03-06 DIAGNOSIS — E1165 Type 2 diabetes mellitus with hyperglycemia: Secondary | ICD-10-CM

## 2023-03-07 ENCOUNTER — Encounter: Payer: Self-pay | Admitting: Cardiovascular Disease

## 2023-03-07 ENCOUNTER — Ambulatory Visit: Payer: Medicare Other | Attending: Cardiovascular Disease | Admitting: Cardiovascular Disease

## 2023-03-07 VITALS — BP 136/76 | HR 78 | Ht 69.0 in | Wt 286.6 lb

## 2023-03-07 DIAGNOSIS — G4733 Obstructive sleep apnea (adult) (pediatric): Secondary | ICD-10-CM

## 2023-03-07 DIAGNOSIS — E785 Hyperlipidemia, unspecified: Secondary | ICD-10-CM

## 2023-03-07 DIAGNOSIS — I480 Paroxysmal atrial fibrillation: Secondary | ICD-10-CM

## 2023-03-07 DIAGNOSIS — R600 Localized edema: Secondary | ICD-10-CM | POA: Diagnosis present

## 2023-03-07 DIAGNOSIS — Z951 Presence of aortocoronary bypass graft: Secondary | ICD-10-CM | POA: Diagnosis present

## 2023-03-07 DIAGNOSIS — Z7901 Long term (current) use of anticoagulants: Secondary | ICD-10-CM

## 2023-03-07 DIAGNOSIS — Z794 Long term (current) use of insulin: Secondary | ICD-10-CM

## 2023-03-07 DIAGNOSIS — E1159 Type 2 diabetes mellitus with other circulatory complications: Secondary | ICD-10-CM

## 2023-03-07 DIAGNOSIS — I2510 Atherosclerotic heart disease of native coronary artery without angina pectoris: Secondary | ICD-10-CM

## 2023-03-07 NOTE — Progress Notes (Signed)
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 (independently read by me): Atrial fibrillation at 97, LBBB  Apr 06, 2021 ECG (independently read by me): AV paced at 70; PR 214 msec    November 2020 ECG (independently read by me): AV paced rhythm at 70 bpm with prolonged AV conduction.  PR interval 224 ms.  January 04, 2019 ECG (independently read by me): Atrially paced rhythm at 65 bpm with prolonged AV conduction with a peroneal a 254 ms.  Left bundle branch block.  June 26, 2018 ECG (independently read by me):  Atrial paced at 71, prolonged AV conduction PR 250 msec; [facility]  June 07, 2018 ECG (independently read by me): Atrially paced rhythm at 70 bpm with prolonged AV conduction with PR interval at 238.  Left bundle branch block.  May 2019 ECG (independently read by me): Atrially paced rhythm at 76 bpm with prolonged AV conduction with a PR interval 236 ms.  Left bundle branch block.  January 2019 ECG (independently read by me): Atrial paced rhythm at 70 bpm.  Prolonged AV conduction with a PR interval 248.  Left bundle branch block.  QTc interval 494 ms.  April 2018 ECG (independently read by me): Atrially paced rhythm at 74 bpm, prolonged A-V conduction with a PR 246.    ECG (independently read by me): Atrial paced rhythm at 76 bpm alternating with atrial  sensing in a bigeminal pattern.  There is left bundle branch block.  Recent Labs:    Latest Ref Rng & Units 01/31/2023    8:11 AM 07/21/2022   10:17 AM 04/20/2022    9:55 AM  BMP  Glucose 70 - 99 mg/dL 98  098  119   BUN 8 - 27 mg/dL 19  25  28    Creatinine 0.76 - 1.27 mg/dL 1.47  8.29  5.62   BUN/Creat Ratio 10 - 24 17  21   Sodium 134 - 144 mmol/L 143  138  140   Potassium 3.5 - 5.2 mmol/L 5.1  4.3  4.7   Chloride 96 - 106 mmol/L 104  107  104   CO2 20 - 29 mmol/L 25  23  20    Calcium 8.6 - 10.2 mg/dL 9.1  8.7  9.1         Latest Ref Rng & Units 01/31/2023    8:11 AM 07/21/2022   10:17 AM 04/06/2022    2:00 PM  Hepatic Function  Total Protein 6.0 - 8.5 g/dL 6.9  7.1  7.4   Albumin 3.8 - 4.8 g/dL 4.2  3.8  4.2   AST 0 - 40 IU/L 16  14  18    ALT 0 - 44 IU/L 23  16  32   Alk Phosphatase 44 - 121 IU/L 77  70  62   Total Bilirubin 0.0 - 1.2 mg/dL 0.4  0.5  0.6        Latest Ref Rng & Units 04/06/2022    2:00 PM 03/12/2022    9:20 AM 02/11/2022   12:10 PM  CBC  WBC 4.0 - 10.5 K/uL 6.7  6.9  6.0   Hemoglobin 13.0 - 17.0 g/dL 16.1  09.6  04.5   Hematocrit 39.0 - 52.0 % 44.8  45.4  43.3   Platelets 150 - 400 K/uL 187  223  187    Lab Results  Component Value Date   MCV 92.9 04/06/2022   MCV 91 03/12/2022   MCV 96.4 02/11/2022   Lab Results  Component Value Date   TSH 1.930 03/12/2022   Lab Results  Component Value Date   HGBA1C 7.2 (H) 01/31/2023     BNP    Component Value Date/Time   BNP 361.5 (H) 04/06/2022 1400   BNP 77.0 07/03/2013 1604    ProBNP    Component Value Date/Time   PROBNP 739 (H) 08/07/2018 0912   PROBNP 364.0 (H) 10/21/2015 0932     Lipid Panel     Component Value Date/Time   CHOL 170 07/21/2022 1017   TRIG 124.0 07/21/2022 1017   HDL 44.20 07/21/2022 1017   CHOLHDL 4 07/21/2022 1017   VLDL 24.8 07/21/2022 1017   LDLCALC 101 (H) 07/21/2022 1017   LDLDIRECT 76 01/31/2023 0811     RADIOLOGY: CUP PACEART REMOTE DEVICE  CHECK  Result Date: 02/14/2023 Scheduled remote reviewed. Normal device function.  Known PAF, on OAC, AF burden is 6.7% of the time Next remote 91 days. Hassell Halim, RN, CCDS, [employer]    Additional studies/ records that were reviewed today include:  I reviewed the patient's hospitalizations, office visits evaluations, notes from Dr. Tyrone Sage, laboratory, and  catheterization data, I reviewed his echo Doppler study as well as his recent evaluation from [facility]. Recent hospitalization for ICD implant. A new download was obtained of his CPAP unit from October 17 through October 07, 2019.  EP records were reviewed.  Download was obtained from April 14 through Apr 03, 2021 which shows excellent compliance with average use at 9 hours and 16 minutes.  There is significant mask leak.  AutoSet pressure range was from 16 to 20 cm.  AHI 13.2.  A new download was obtained from January 22 through January 11, 2022 as noted above  A download was obtained from March 14 through March 04, 2023.  ASSESSMENT:    1. OSA on CPAP   2. CAD  in native artery   3. S/P CABG x 4   4. PAF (paroxysmal atrial fibrillation)   5. Type 2 diabetes mellitus with other circulatory complication, with long-term current use of insulin   6. Morbid obesity   7. Hyperlipidemia LDL goal <55   8. Lower extremity edema   9. Chronic anticoagulation      PLAN:  Charles [name] is a 77 year-old gentleman who had previously undergone multiple coronary interventions since 1995 for significant coronary obstructive disease.  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 Tyrone Sage on 09/15/2016.  He has a history of PAF and was maintaining sinus rhythm with atrial pacing.  He is on Xarelto for anticoagulation.   His pacemaker was followed by Dr. Johney Frame and in September 2020 had implantation/upgrade of a dual chamber PPM to CRT-P on 08/14/2019 by Dr Johney Frame. and received a Dentist model [id number] (serial number [id number]) lead, [employer] MP model [id number] (serial  Number [id number]) biventricular pacemaker.  His echo on August 13, 2019 revealed an EF of 35 to 40% with mild LVH grade 1 diastolic dysfunction, and moderate mitral regurgitation.  Most recently, he is now on Entresto 24/26 mg twice a day, Farxiga 2.5 mg twice a day, carvedilol 6.25 mg twice a day, and as needed furosemide.  His echo study from May 07, 2021 demonstrated normalization of LV function with EF at 55 to 60%.  There were no wall motion abnormalities.  There was grade 1 diastolic dysfunction.  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  He was last evaluated in A-fib clinic in September 2023 and was maintaining sinus rhythm and he was maintained on carvedilol 12.5 mg twice a day and Xarelto 20 mg daily.  When last seen by me in Feb 12, 2023he was in atrial fibrillation.  At that time I recommended he titrate carvedilol from 6.25 mg twice a day to 12.5 mg twice a day.  Follow-up EP evaluation was recommended.  His blood pressure today is stable and rechecked by me was 116/76 on his regimen of carvedilol 12.5 mg twice a day, Entresto 49/51 mg twice a day, Farxiga 5 mg, furosemide 40 mg daily.  He is diabetic on Farxiga in addition to NovoLog and [employer].  He is statin intolerant and has been on Zetia 10 mg for hyperlipidemia.  He had undergone repeat laboratory on January 31, 2023.  LDL cholesterol had improved from 108 down to 76.  Ultimate target is less than 55.  If unable to reach, he may be a candidate for trial of bempedoic acid or possible Repatha.  He continues to have significant elevation of his AHI despite maximal dose of CPAP at 20 cm of water.  He had been scheduled for a BiPAP titration but unfortunately this had to be canceled secondary to death of 2 of his siblings in January 03, 2023.  We will try to  expeditiously reschedule a BiPAP titration.  His blood pressure today is stable on his guideline directed medical therapy including Entresto 49/51 mg twice a day, carvedilol 12.5 mg twice a day, as needed furosemide.  He does have mild leg swelling at the end of the day.  I suggest that he wear 20 to 30 mm pressure support stockings.  He is diabetic on Farxiga, metformin, NovoLog and Tresiba insulin.  He continues to be anticoagulated on Xarelto.  I will see him in 4 to 5 months for follow-up evaluation and at that time he should have had BiPAP therapy hopefully for several months.   Medication Adjustments/Labs and Tests Ordered: Current medicines are reviewed at length with the patient today.  Concerns regarding medicines are outlined above.  Medication changes, Labs and Tests ordered today are listed in the Patient Instructions below. Patient Instructions  Medication Instructions:  Continue same medications *If you need a refill on your cardiac medications before your next appointment, please call your pharmacy*   Lab Work: None ordered   Testing/Procedures: B-Pap Titration    Follow-Up: At [facility], you and your health needs are our priority.  As part of our continuing mission to provide you with exceptional heart care, we have created designated Provider Care Teams.  These Care Teams include your primary Cardiologist (physician) and Advanced Practice Providers (APPs -  Physician Assistants and Nurse Practitioners) who all work together to provide you with the care you need, when you need it.  We recommend signing up for the patient portal called "MyChart".  Sign up information is provided on this After Visit Summary.  MyChart is used to connect with patients for Virtual Visits (Telemedicine).  Patients are able to view lab/test results, encounter notes, upcoming appointments, etc.  Non-urgent messages can be sent to your provider as well.   To learn more about what you can do  with MyChart, go to [email or web address].    Your next appointment:  3 months    Provider:  Kandice Robinsons     Signed, Nicki Guadalajara, MD  03/12/2023 6:44 AM    [facility] [address], [place], [place]  [address] Phone: [phone]

## 2023-03-07 NOTE — Patient Instructions (Signed)
Medication Instructions:  Continue same medications *If you need a refill on your cardiac medications before your next appointment, please call your pharmacy*   Lab Work: None ordered   Testing/Procedures: B-Pap Titration    Follow-Up: At [facility], you and your health needs are our priority.  As part of our continuing mission to provide you with exceptional heart care, we have created designated Provider Care Teams.  These Care Teams include your primary Cardiologist (physician) and Advanced Practice Providers (APPs -  Physician Assistants and Nurse Practitioners) who all work together to provide you with the care you need, when you need it.  We recommend signing up for the patient portal called "MyChart".  Sign up information is provided on this After Visit Summary.  MyChart is used to connect with patients for Virtual Visits (Telemedicine).  Patients are able to view lab/test results, encounter notes, upcoming appointments, etc.  Non-urgent messages can be sent to your provider as well.   To learn more about what you can do with MyChart, go to [email or web address].    Your next appointment:  3 months    Provider:  [facility]

## 2023-03-08 ENCOUNTER — Telehealth: Payer: Self-pay | Admitting: Cardiovascular Disease

## 2023-03-08 NOTE — Telephone Encounter (Signed)
  Pt is following up his sleep study, he said, he was told by Dr. Tresa Endo , Burna Mortimer will call him today. He said, he will be available tomorrow after 12:30 pm

## 2023-03-10 ENCOUNTER — Other Ambulatory Visit: Payer: Self-pay | Admitting: Cardiovascular Disease

## 2023-03-10 DIAGNOSIS — G4733 Obstructive sleep apnea (adult) (pediatric): Secondary | ICD-10-CM

## 2023-03-12 ENCOUNTER — Encounter: Payer: Self-pay | Admitting: Cardiovascular Disease

## 2023-03-13 ENCOUNTER — Other Ambulatory Visit: Payer: Self-pay | Admitting: Internal Medicine

## 2023-03-14 ENCOUNTER — Other Ambulatory Visit: Payer: Self-pay | Admitting: Endocrinology

## 2023-03-14 ENCOUNTER — Ambulatory Visit: Payer: Medicare Other | Attending: Cardiovascular Disease

## 2023-03-14 DIAGNOSIS — I5042 Chronic combined systolic (congestive) and diastolic (congestive) heart failure: Secondary | ICD-10-CM | POA: Diagnosis not present

## 2023-03-14 DIAGNOSIS — Z950 Presence of cardiac pacemaker: Secondary | ICD-10-CM | POA: Diagnosis not present

## 2023-03-14 NOTE — Telephone Encounter (Signed)
Pt last saw Dr Tresa Endo 03/07/23, last labs 01/31/23 Creat 1.14, age 77, weight 130kg, CrCl 101.36, based on CrCl pt is on appropriate dosage of Xarelto  QD.  Will refill rx.

## 2023-03-16 LAB — HM DIABETES EYE EXAM

## 2023-03-18 NOTE — Progress Notes (Signed)
EPIC Encounter for ICM Monitoring  Patient Name: IVOR KISHI is a 77 y.o. male Date: 03/18/2023 Primary Care Physican: [name], PA-C Primary Cardiologist: Tresa Endo Electrophysiologist: Mealor Bi-V Pacing: 93%  08/13/2022 Weight: 279 lbs 10/22/2022 Weigh: 277.1 lbs   AT/AF Burden: 4.7% (taking Xarelto)                                                          Attempted call to patient and unable to reach.  Transmission reviewed.    CorVue thoracic impedance suggesting normal fluid levels.   Prescribed: Furosemide 40 mg take 1 tablet (40 mg total) by mouth daily as needed Farxiga 50 mg take 1 tablet daily Xarelto 20 mg take 1 tablet at supper daily   Labs: 01/31/2023 Creatinine 1.14, BUN 19, Potassium 5.1, Sodium 143, GFR 67 A complete set of results can be found in Results Review.   Recommendations:  Unable to reach.     Follow-up plan: ICM clinic phone appointment on 04/19/2023.   91 day device clinic remote transmission 05/17/2023.   EP/Cardiology Office Visits:  Recall 12/01/2023 with Otilio Saber, PA   Copy of ICM check sent to Dr. Nelly Laurence.     3 month ICM trend: 03/14/2023.    12-14 Month ICM trend:     [name], RN 03/18/2023 2:58 PM

## 2023-03-20 ENCOUNTER — Encounter: Payer: Self-pay | Admitting: Endocrinology

## 2023-03-24 ENCOUNTER — Telehealth: Payer: Self-pay | Admitting: Cardiovascular Disease

## 2023-03-24 NOTE — Telephone Encounter (Signed)
LVM for patient to call office.

## 2023-03-24 NOTE — Telephone Encounter (Signed)
Pt c/o medication issue:  1. Name of Medication:   XARELTO 20 MG TABS tablet    2. How are you currently taking this medication (dosage and times per day)?   TAKE 1 TABLET BY MOUTH DAILY WITH SUPPER    3. Are you having a reaction (difficulty breathing--STAT)? No  4. What is your medication issue? Patient is calling in regards to him continuing his xarelto. Please advise

## 2023-03-29 NOTE — Progress Notes (Signed)
Remote pacemaker transmission.   

## 2023-03-29 NOTE — Telephone Encounter (Signed)
LVM to please call our office 

## 2023-03-30 ENCOUNTER — Ambulatory Visit (HOSPITAL_BASED_OUTPATIENT_CLINIC_OR_DEPARTMENT_OTHER): Payer: Medicare Other | Attending: Cardiovascular Disease | Admitting: Cardiovascular Disease

## 2023-03-30 VITALS — Ht 69.0 in | Wt 280.0 lb

## 2023-03-30 DIAGNOSIS — G4733 Obstructive sleep apnea (adult) (pediatric): Secondary | ICD-10-CM | POA: Diagnosis present

## 2023-03-31 NOTE — Telephone Encounter (Signed)
Called patient to clarify, he states it is all good, he does not need any further assistance.   Thanks!

## 2023-03-31 NOTE — Telephone Encounter (Signed)
Patient states having cataract surgery and OK to have surgery continue Xarelto through surgery.  Surgery is next week

## 2023-04-05 ENCOUNTER — Other Ambulatory Visit: Payer: Self-pay | Admitting: Endocrinology

## 2023-04-05 DIAGNOSIS — E782 Mixed hyperlipidemia: Secondary | ICD-10-CM

## 2023-04-12 ENCOUNTER — Other Ambulatory Visit: Payer: Self-pay | Admitting: Endocrinology

## 2023-04-15 ENCOUNTER — Encounter (HOSPITAL_BASED_OUTPATIENT_CLINIC_OR_DEPARTMENT_OTHER): Payer: Self-pay | Admitting: Cardiovascular Disease

## 2023-04-15 NOTE — Procedures (Signed)
Patient Name: Charles [name], Charles [name] Date: 03/30/2023 Gender: Male D.O.B: January 28, 1946 Age (years): 57 Referring Provider: Nicki Guadalajara MD, ABSM Height (inches): 69 Interpreting Physician: Nicki Guadalajara MD, ABSM Weight (lbs): 280 RPSGT: Lowry Ram BMI: 41 MRN: [id number] Neck Size: 17.25  CLINICAL INFORMATION The patient is referred for a BiPAP titration to treat sleep apnea.  On CPAP therapy with last machine set up July 12, 2018: currently at maximal pressure with continued events.  SLEEP STUDY TECHNIQUE As per the AASM Manual for the Scoring of Sleep and Associated Events v2.3 (April 2016) with a hypopnea requiring 4% desaturations.  The channels recorded and monitored were frontal, central and occipital EEG, electrooculogram (EOG), submentalis EMG (chin), nasal and oral airflow, thoracic and abdominal wall motion, anterior tibialis EMG, snore microphone, electrocardiogram, and pulse oximetry. Bilevel positive airway pressure (BPAP) was initiated at the beginning of the study and titrated to treat sleep-disordered breathing.  MEDICATIONS acetaminophen (TYLENOL) 650 MG CR tablet alfuzosin (UROXATRAL) 10 MG 24 hr tablet aspirin EC 81 MG EC tablet BD INSULIN SYRINGE U/F 30G X 1/2" 0.5 ML MISC BD ULTRA-FINE PEN NEEDLES 29G X 12.7MM MISC carvedilol (COREG) 12.5 MG tablet cephALEXin (KEFLEX) 500 MG capsule COLLAGEN PO Continuous Blood Gluc Receiver (FREESTYLE LIBRE 14 DAY READER) DEVI Continuous Blood Gluc Sensor (FREESTYLE LIBRE 14 DAY SENSOR) MISC cyanocobalamin 1000 MCG tablet ENTRESTO 49-51 MG ezetimibe (ZETIA) 10 MG tablet FARXIGA 5 MG TABS tablet fluticasone (FLONASE) 50 MCG/ACT nasal spray furosemide (LASIX) 40 MG tablet gabapentin (NEURONTIN) 300 MG capsule Glucagon (GVOKE HYPOPEN 2-PACK) 1 MG/0.2ML SOAJ insulin degludec (TRESIBA FLEXTOUCH) 100 UNIT/ML FlexTouch Pen latanoprost (XALATAN) 0.005 % ophthalmic solution meclizine (ANTIVERT) 25 MG  tablet metFORMIN (GLUCOPHAGE-XR) 500 MG 24 hr tablet nitroGLYCERIN (NITROSTAT) 0.4 MG SL tablet NOVOLOG 100 UNIT/ML injection promethazine (PHENERGAN) 25 MG tablet tiZANidine (ZANAFLEX) 2 MG tablet triamcinolone cream (KENALOG) 0.1 % Vibegron (GEMTESA PO) XARELTO 20 MG TABS tablet zinc gluconate 50 MG tablet Medications self-administered by patient taken the night of the study : LATANOPROST  RESPIRATORY PARAMETERS Optimal IPAP Pressure (cm): 20 AHI at Optimal Pressure (/hr) 0.5 Optimal EPAP Pressure (cm): 16   Overall Minimal O2 (%): 84.0 Minimal O2 at Optimal Pressure (%): 91.0  SLEEP ARCHITECTURE Start Time: 10:21:09 PM Stop Time: 4:29:56 AM Total Time (min): 368.8 Total Sleep Time (min): 357 Sleep Latency (min): 2.8 Sleep Efficiency (%): 96.8% REM Latency (min): 178.5 WASO (min): 9.0 Stage N1 (%): 0.7% Stage N2 (%): 95.4% Stage N3 (%): 0.0% Stage R (%): 3.9 Supine (%): 100.00 Arousal Index (/hr): 5.5   CARDIAC DATA The 2 lead EKG demonstrated pacemaker generated. The mean heart rate was 73.0 beats per minute. Other EKG findings include: PVCs.  LEG MOVEMENT DATA The total Periodic Limb Movements of Sleep (PLMS) were 0. The PLMS index was 0.0. A PLMS index of <15 is considered normal in adults.  IMPRESSIONS - BiPAP was initiated at 8/4and was titrated to 20/16 cm (AHI 0.5/h, O2 nadir 92% with absent REM sleep at 20/16. - Central sleep apnea was not noted during this titration (CAI 1.2/h). - Moderete oxygen desaturations were observed during this titration to a nadir of 84% at 11/7. - The patient snored with moderate snoring volume. - 2-lead EKG demonstrated: PVCs, paced rhythm - Clinically significant periodic limb movements were not noted during this study. Arousals associated with PLMs were rare.  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  A large size Resmed Full Face Quattro FX mask was used for the titration. - Effort should be made to optimize nasal and oropharyngeal patency. - Consider an oral guard for Bruxism. - Avoid alcohol, sedatives and other CNS depressants that may worsen sleep apnea and disrupt normal sleep architecture. - Sleep hygiene should be reviewed to assess factors that may improve sleep quality. - Weight management (BMI 41) and regular exercise should be initiated or continued. - Recommend a download and sleep clinic evaluation after 4 - 6 weeks of therapy  [Electronically signed] 04/15/2023 06:26 PM  Nicki Guadalajara MD, [facility], ABSM Diplomate, American Board of Sleep Medicine  NPI: [id number]  [facility] PH: [phone]   FX: [phone] ACCREDITED BY THE AMERICAN ACADEMY OF SLEEP MEDICINE

## 2023-04-19 ENCOUNTER — Other Ambulatory Visit: Payer: Self-pay | Admitting: Endocrinology

## 2023-04-19 ENCOUNTER — Ambulatory Visit: Payer: Medicare Other | Attending: Cardiovascular Disease

## 2023-04-19 DIAGNOSIS — Z950 Presence of cardiac pacemaker: Secondary | ICD-10-CM | POA: Diagnosis not present

## 2023-04-19 DIAGNOSIS — E782 Mixed hyperlipidemia: Secondary | ICD-10-CM

## 2023-04-19 DIAGNOSIS — I5042 Chronic combined systolic (congestive) and diastolic (congestive) heart failure: Secondary | ICD-10-CM | POA: Diagnosis not present

## 2023-04-20 ENCOUNTER — Telehealth: Payer: Self-pay

## 2023-04-20 NOTE — Telephone Encounter (Signed)
Left detailed VM per DPR. BiPAP machine and supplies ordered today 04/20/23 through [facility].

## 2023-04-22 NOTE — Progress Notes (Signed)
EPIC Encounter for ICM Monitoring  Patient Name: Charles [name] is a 77 y.o. male Date: 04/22/2023 Primary Care Physican: [name], PA-C Primary Cardiologist: Tresa Endo Electrophysiologist: Mealor Bi-V Pacing: 93%  03/30/2023 Office weight: 280 lbs   AT/AF Burden: 4.5% (taking Xarelto)                                                          Spoke with wife and heart failure questions reviewed.  Transmission results reviewed.  Pt asymptomatic for fluid accumulation.  Reports feeling well at this time and voices no complaints.     CorVue thoracic impedance suggesting normal fluid levels.   Prescribed: Furosemide 40 mg take 1 tablet (40 mg total) by mouth daily as needed Farxiga 50 mg take 1 tablet daily Xarelto 20 mg take 1 tablet at supper daily   Labs: 01/31/2023 Creatinine 1.14, BUN 19, Potassium 5.1, Sodium 143, GFR 67 A complete set of results can be found in Results Review.   Recommendations:  No changes and encouraged to call if experiencing any fluid symptoms.   Follow-up plan: ICM clinic phone appointment on 05/23/2023.   91 day device clinic remote transmission 05/17/2023.   EP/Cardiology Office Visits:  Recall 12/01/2023 with Otilio Saber, PA   Copy of ICM check sent to Dr. Nelly Laurence.     3 month ICM trend: 04/19/2023.    12-14 Month ICM trend:     [name], RN 04/22/2023 2:09 PM

## 2023-04-26 ENCOUNTER — Other Ambulatory Visit: Payer: Self-pay | Admitting: Endocrinology

## 2023-04-26 DIAGNOSIS — E782 Mixed hyperlipidemia: Secondary | ICD-10-CM

## 2023-05-02 ENCOUNTER — Telehealth: Payer: Self-pay

## 2023-05-02 DIAGNOSIS — E1165 Type 2 diabetes mellitus with hyperglycemia: Secondary | ICD-10-CM

## 2023-05-02 NOTE — Telephone Encounter (Signed)
Spoke with patient.  He is aware of the order for sensor has been placed.

## 2023-05-02 NOTE — Telephone Encounter (Signed)
Order has been placed through [employer] to [facility].

## 2023-05-17 ENCOUNTER — Ambulatory Visit (INDEPENDENT_AMBULATORY_CARE_PROVIDER_SITE_OTHER): Payer: Medicare Other

## 2023-05-17 DIAGNOSIS — I495 Sick sinus syndrome: Secondary | ICD-10-CM | POA: Diagnosis not present

## 2023-05-18 LAB — CUP PACEART REMOTE DEVICE CHECK
Battery Remaining Longevity: 50 mo
Battery Remaining Percentage: 51 %
Battery Voltage: 2.98 V
Brady Statistic AP VP Percent: 89 %
Brady Statistic AP VS Percent: 1 %
Brady Statistic AS VP Percent: 7.5 %
Brady Statistic AS VS Percent: 1.8 %
Brady Statistic RA Percent Paced: 82 %
Date Time Interrogation Session: 20240625020008
Implantable Lead Connection Status: 753985
Implantable Lead Connection Status: 753985
Implantable Lead Connection Status: 753985
Implantable Lead Implant Date: 20091007
Implantable Lead Implant Date: 20091007
Implantable Lead Implant Date: 20200922
Implantable Lead Location: 753858
Implantable Lead Location: 753859
Implantable Lead Location: 753860
Implantable Pulse Generator Implant Date: 20200922
Lead Channel Impedance Value: 1275 Ohm
Lead Channel Impedance Value: 460 Ohm
Lead Channel Impedance Value: 660 Ohm
Lead Channel Pacing Threshold Amplitude: 0.5 V
Lead Channel Pacing Threshold Amplitude: 0.75 V
Lead Channel Pacing Threshold Amplitude: 1.5 V
Lead Channel Pacing Threshold Pulse Width: 0.4 ms
Lead Channel Pacing Threshold Pulse Width: 0.4 ms
Lead Channel Pacing Threshold Pulse Width: 0.5 ms
Lead Channel Sensing Intrinsic Amplitude: 1.7 mV
Lead Channel Sensing Intrinsic Amplitude: 12 mV
Lead Channel Setting Pacing Amplitude: 1.75 V
Lead Channel Setting Pacing Amplitude: 2 V
Lead Channel Setting Pacing Amplitude: 2.5 V
Lead Channel Setting Pacing Pulse Width: 0.4 ms
Lead Channel Setting Pacing Pulse Width: 0.5 ms
Lead Channel Setting Sensing Sensitivity: 2 mV
Pulse Gen Model: 3562
Pulse Gen Serial Number: 9157656

## 2023-05-23 ENCOUNTER — Ambulatory Visit: Payer: Medicare Other | Attending: Cardiovascular Disease

## 2023-05-23 DIAGNOSIS — Z950 Presence of cardiac pacemaker: Secondary | ICD-10-CM | POA: Diagnosis not present

## 2023-05-23 DIAGNOSIS — I5042 Chronic combined systolic (congestive) and diastolic (congestive) heart failure: Secondary | ICD-10-CM

## 2023-05-24 NOTE — Progress Notes (Signed)
EPIC Encounter for ICM Monitoring  Patient Name: Charles [name] is a 77 y.o. male Date: 05/24/2023 Primary Care Physican: [name], PA-C rimary Cardiologist: Tresa Endo Electrophysiologist: Mealor Bi-V Pacing: 93%  03/30/2023 Office weight: 280 lbs 05/24/2023 Weight:  271 lbs   AT/AF Burden: 4.4% (taking Xarelto)                                                          Spoke with wife and heart failure questions reviewed.  Transmission results reviewed.  Pt currently has a UTI and taking meds to resolve but not feeling well.     CorVue thoracic impedance suggesting possible fluid accumulation starting 6/27.   Prescribed: Furosemide 40 mg take 1 tablet (40 mg total) by mouth daily as needed Farxiga 50 mg take 1 tablet daily Xarelto 20 mg take 1 tablet at supper daily   Labs: 01/31/2023 Creatinine 1.14, BUN 19, Potassium 5.1, Sodium 143, GFR 67 A complete set of results can be found in Results Review.   Recommendations:  Advised to take 0.5 or 1 tablet PRN Furosemide x 2 days and will recheck fluid levels.   Follow-up plan: ICM clinic phone appointment on 05/30/2023 to recheck fluid levels.   91 day device clinic remote transmission 08/16/2023.   EP/Cardiology Office Visits:  Recall 12/01/2023 with Otilio Saber, PA   Copy of ICM check sent to Dr. Nelly Laurence and Dr Tresa Endo for [facility].     3 month ICM trend: 05/23/2023.    12-14 Month ICM trend:     [name], RN 05/24/2023 1:13 PM

## 2023-05-25 ENCOUNTER — Telehealth (HOSPITAL_BASED_OUTPATIENT_CLINIC_OR_DEPARTMENT_OTHER): Payer: Self-pay | Admitting: Family
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
## 2023-05-30 ENCOUNTER — Ambulatory Visit: Payer: Medicare Other

## 2023-05-30 DIAGNOSIS — I5042 Chronic combined systolic (congestive) and diastolic (congestive) heart failure: Secondary | ICD-10-CM

## 2023-05-30 DIAGNOSIS — Z950 Presence of cardiac pacemaker: Secondary | ICD-10-CM

## 2023-05-31 ENCOUNTER — Telehealth: Payer: Self-pay

## 2023-05-31 NOTE — Telephone Encounter (Signed)
Remote ICM transmission received.  Attempted call to patient regarding ICM remote transmission and left detailed message per DPR.  Left ICM phone number and advised to return call for any fluid symptoms or questions. Next ICM remote transmission scheduled 06/27/2023.    

## 2023-05-31 NOTE — Progress Notes (Signed)
EPIC Encounter for ICM Monitoring  Patient Name: Charles [name] is a 77 y.o. male Date: 05/31/2023 Primary Care Physican: [name], PA-C Primary Cardiologist: Tresa Endo Electrophysiologist: Mealor Bi-V Pacing: 93%  03/30/2023 Office weight: 280 lbs 05/24/2023 Weight:  271 lbs   AT/AF Burden: 4.4% (taking Xarelto)                                                          Attempted call to patient and unable to reach.  Left detailed message per DPR regarding transmission. Transmission reviewed.    CorVue thoracic impedance suggesting fluid levels returned to normal after recommending to take PRN Furosemide x 1-2 days.   Prescribed: Furosemide 40 mg take 1 tablet (40 mg total) by mouth daily as needed Farxiga 50 mg take 1 tablet daily Xarelto 20 mg take 1 tablet at supper daily   Labs: 01/31/2023 Creatinine 1.14, BUN 19, Potassium 5.1, Sodium 143, GFR 67 A complete set of results can be found in Results Review.   Recommendations:  Left voice mail with ICM number and encouraged to call if experiencing any fluid symptoms.   Follow-up plan: ICM clinic phone appointment on 06/27/2023.   91 day device clinic remote transmission 08/16/2023.   EP/Cardiology Office Visits:  Recall 12/01/2023 with Otilio Saber, PA   Copy of ICM check sent to Dr. Nelly Laurence.   3 month ICM trend: 05/30/2023.    12-14 Month ICM trend:     [name], RN 05/31/2023 2:59 PM

## 2023-06-01 ENCOUNTER — Other Ambulatory Visit: Payer: Self-pay

## 2023-06-01 ENCOUNTER — Telehealth: Payer: Self-pay | Admitting: Internal Medicine

## 2023-06-01 ENCOUNTER — Emergency Department (HOSPITAL_COMMUNITY)
Admission: EM | Admit: 2023-06-01 | Discharge: 2023-06-02 | Disposition: A | Discharge status: Home / Self Care | Payer: Medicare Other | Attending: Emergency Medicine | Admitting: Emergency Medicine

## 2023-06-01 DIAGNOSIS — Z7982 Long term (current) use of aspirin: Secondary | ICD-10-CM | POA: Diagnosis not present

## 2023-06-01 DIAGNOSIS — K5900 Constipation, unspecified: Secondary | ICD-10-CM | POA: Insufficient documentation

## 2023-06-01 DIAGNOSIS — R109 Unspecified abdominal pain: Secondary | ICD-10-CM | POA: Diagnosis present

## 2023-06-01 DIAGNOSIS — Z7901 Long term (current) use of anticoagulants: Secondary | ICD-10-CM | POA: Diagnosis not present

## 2023-06-01 DIAGNOSIS — R7309 Other abnormal glucose: Secondary | ICD-10-CM | POA: Diagnosis not present

## 2023-06-01 DIAGNOSIS — Z91040 Latex allergy status: Secondary | ICD-10-CM | POA: Insufficient documentation

## 2023-06-01 LAB — CBC
HCT: 47.4 % (ref 39.0–52.0)
Hemoglobin: 15.7 g/dL (ref 13.0–17.0)
MCH: 30.9 pg (ref 26.0–34.0)
MCHC: 33.1 g/dL (ref 30.0–36.0)
MCV: 93.3 fL (ref 80.0–100.0)
Platelets: 179 10*3/uL (ref 150–400)
RBC: 5.08 MIL/uL (ref 4.22–5.81)
RDW: 13.9 % (ref 11.5–15.5)
WBC: 10.1 10*3/uL (ref 4.0–10.5)
nRBC: 0 % (ref 0.0–0.2)

## 2023-06-01 LAB — COMPREHENSIVE METABOLIC PANEL
ALT: 35 U/L (ref 0–44)
AST: 22 U/L (ref 15–41)
Albumin: 3.9 g/dL (ref 3.5–5.0)
Alkaline Phosphatase: 78 U/L (ref 38–126)
Anion gap: 10 (ref 5–15)
BUN: 35 mg/dL — ABNORMAL HIGH (ref 8–23)
CO2: 20 mmol/L — ABNORMAL LOW (ref 22–32)
Calcium: 9 mg/dL (ref 8.9–10.3)
Chloride: 106 mmol/L (ref 98–111)
Creatinine, Ser: 1.36 mg/dL — ABNORMAL HIGH (ref 0.61–1.24)
GFR, Estimated: 54 mL/min — ABNORMAL LOW (ref >60)
Glucose, Bld: 190 mg/dL — ABNORMAL HIGH (ref 70–99)
Potassium: 4.6 mmol/L (ref 3.5–5.1)
Sodium: 136 mmol/L (ref 135–145)
Total Bilirubin: 0.7 mg/dL (ref 0.3–1.2)
Total Protein: 7.5 g/dL (ref 6.5–8.1)

## 2023-06-01 LAB — LIPASE, BLOOD: Lipase: 28 U/L (ref 11–51)

## 2023-06-01 NOTE — Telephone Encounter (Signed)
Called patient to inform of Dr.Pyrtle's recommendations. Asked patient if he was passing gas, initially states he was not passing gas, then changed his answer and stated he was passing some gas. Wife informed the nurse the patient was having other issues and referred back to the patient, which then stated he was having a red muddy stool. Instructed the patient to go to the ED. Informed him there were too many variations and he needed to be checked and again instructed him to go to the ED. Patient agreed. Intially spoke to the patient in reference to being constipated and not having a stool for 3 days. Message sent to Dr. Rhea Belton and a response was sent back for Miralax administration. Then patient and his wife began speaking about other issues. Refused to make appt and also stated will find out what to do once he has made visit to the ED.

## 2023-06-01 NOTE — ED Triage Notes (Signed)
Pt arrives to ED c/o constipation x 3 days. Pt reports that last bowel movement had bright red blood in stool. Pt endorses over the counter stool softeners with no relief. Pt reports lower abdominal discomfort.

## 2023-06-01 NOTE — Telephone Encounter (Signed)
Patient called states he has not be able to have a bowel movement for about 3 days now and is seeking further advise.

## 2023-06-01 NOTE — Telephone Encounter (Signed)
Patient has not had a BM in 3 days, seeking further advise.

## 2023-06-01 NOTE — Telephone Encounter (Signed)
Would recommend he take 3 doses of MiraLAX today and then 17 g twice daily until resumption of more regular bowel movements He should continue the Metamucil previously recommended by Dr. Leone Payor

## 2023-06-02 ENCOUNTER — Emergency Department (HOSPITAL_COMMUNITY): Payer: Medicare Other

## 2023-06-02 DIAGNOSIS — K5900 Constipation, unspecified: Secondary | ICD-10-CM | POA: Diagnosis not present

## 2023-06-02 LAB — COMPREHENSIVE METABOLIC PANEL
ALT: 34 IU/L (ref 0–44)
AST: 19 IU/L (ref 0–40)
Albumin: 4.2 g/dL (ref 3.8–4.8)
Alkaline Phosphatase: 89 IU/L (ref 44–121)
BUN/Creatinine Ratio: 25 — ABNORMAL HIGH (ref 10–24)
BUN: 31 mg/dL — ABNORMAL HIGH (ref 8–27)
Bilirubin Total: 0.7 mg/dL (ref 0.0–1.2)
CO2: 17 mmol/L — ABNORMAL LOW (ref 20–29)
Calcium: 8.9 mg/dL (ref 8.6–10.2)
Chloride: 104 mmol/L (ref 96–106)
Creatinine, Ser: 1.25 mg/dL (ref 0.76–1.27)
Globulin, Total: 2.7 g/dL (ref 1.5–4.5)
Glucose: 164 mg/dL — ABNORMAL HIGH (ref 70–99)
Potassium: 4.3 mmol/L (ref 3.5–5.2)
Sodium: 137 mmol/L (ref 134–144)
Total Protein: 6.9 g/dL (ref 6.0–8.5)
eGFR: 60 mL/min/{1.73_m2} (ref >59)

## 2023-06-02 LAB — URINALYSIS, ROUTINE W REFLEX MICROSCOPIC
Bilirubin Urine: NEGATIVE
Glucose, UA: >=500 mg/dL — AB
Hgb urine dipstick: NEGATIVE
Ketones, ur: NEGATIVE mg/dL
Leukocytes,Ua: NEGATIVE
Nitrite: NEGATIVE
Protein, ur: NEGATIVE mg/dL
RBC / HPF: >50 RBC/hpf (ref 0–5)
Specific Gravity, Urine: 1.026 (ref 1.005–1.030)
pH: 5 (ref 5.0–8.0)

## 2023-06-02 LAB — HEMOGLOBIN A1C
Est. average glucose Bld gHb Est-mCnc: 160 mg/dL
Hgb A1c MFr Bld: 7.2 % — ABNORMAL HIGH (ref 4.8–5.6)

## 2023-06-02 LAB — CBG MONITORING, ED: Glucose-Capillary: 179 mg/dL — ABNORMAL HIGH (ref 70–99)

## 2023-06-02 LAB — MICROALBUMIN / CREATININE URINE RATIO
Creatinine, Urine: 92.3 mg/dL
Microalb/Creat Ratio: 36 mg/g creat — ABNORMAL HIGH (ref 0–29)
Microalbumin, Urine: 33.2 ug/mL

## 2023-06-02 MED ORDER — POLYETHYLENE GLYCOL 3350 17 G PO PACK: 17.0000 g | PACK | Freq: Two times a day (BID) | ORAL | 0 refills | Status: AC | PRN

## 2023-06-02 MED ORDER — AMOXICILLIN-POT CLAVULANATE 875-125 MG PO TABS
1.0000 | ORAL_TABLET | Freq: Once | ORAL | Status: AC
  Administered 2023-06-02: 1 via ORAL
  Filled 2023-06-02: qty 1

## 2023-06-02 MED ORDER — IOHEXOL 350 MG/ML SOLN
75.0000 mL | Freq: Once | INTRAVENOUS | Status: AC | PRN
  Administered 2023-06-02: 75 mL via INTRAVENOUS

## 2023-06-02 MED ORDER — AMOXICILLIN-POT CLAVULANATE 875-125 MG PO TABS: 1.0000 | ORAL_TABLET | Freq: Two times a day (BID) | ORAL | 0 refills | Status: DC

## 2023-06-02 MED ORDER — PANTOPRAZOLE SODIUM 20 MG PO TBEC: 20.0000 mg | DELAYED_RELEASE_TABLET | Freq: Every day | ORAL | 0 refills | Status: DC

## 2023-06-02 MED ORDER — LACTATED RINGERS IV BOLUS
1000.0000 mL | Freq: Once | INTRAVENOUS | Status: AC
  Administered 2023-06-02: 1000 mL via INTRAVENOUS

## 2023-06-02 MED ORDER — PANTOPRAZOLE SODIUM 40 MG PO TBEC
40.0000 mg | DELAYED_RELEASE_TABLET | Freq: Once | ORAL | Status: AC
  Administered 2023-06-02: 40 mg via ORAL
  Filled 2023-06-02: qty 1

## 2023-06-02 MED ORDER — DOCUSATE SODIUM 100 MG PO CAPS: 100.0000 mg | ORAL_CAPSULE | Freq: Two times a day (BID) | ORAL | 0 refills | Status: DC

## 2023-06-02 NOTE — Telephone Encounter (Signed)
Please let patient know I reviewed his ER visit.  He had a fecal impaction and associated rectal inflammation.  I hope he is feeling better.   Please ask him if he has moved his bowels and get an idea of how much.  It certainly possible he may see some more bleeding.   I do not think he needs the antibiotics for this but the ER doctor recommended.   I think he should take at least 2 doses of MiraLAX for the next several days and take that regularly to keep his bowels moving.  Once I see the update I will advise further.

## 2023-06-02 NOTE — ED Notes (Addendum)
Patient's visitor stopped this RN while attempting to clean the feces out of patient's shorts, off of patient's buttocks and listening to patient speak. Patient visitor stated she is dissatisfied with how long the wait has been, that triage told them they would get a scan and that they haven't had anything to eat in a very long time. At the same time the patient was talking about his age and recent loss of family members. This RN explained to patient and visitor that it is difficult to understand while both of them spoke at once and unfortunately there have been long waits and that we as a facility are sorry for the inconvenience. Visitor stated she had "never seen anything like it" and that she doesn't like this [employer] fund[employer]. This RN asked visitor to please stop yelling. Patient interceded and asked visitor to be quiet in order to proceed with care. Visitor stopped talking and remained quiet. IV start and CT scan explained to patient. Patient cleaned and provided with brief, absorbant pads and warm blanket.

## 2023-06-02 NOTE — ED Notes (Signed)
Patient' wife came out of room and stared at this RN while working with PTAR to discharge a hallway patient. This RN asked if visitor staring needed something. Patient's visitor stated "his blood sugar drops fast and he feels like its dropping." This RN stated to visitor ok I will be there in a minute after I am finished with this patient. Blood sugar checked 179.

## 2023-06-02 NOTE — Telephone Encounter (Signed)
Left message for patient to call back  

## 2023-06-02 NOTE — ED Provider Notes (Signed)
[place] EMERGENCY DEPARTMENT AT [facility] Provider Note   CSN: [id number] Arrival date & time: 06/01/23  2012     History  Chief Complaint  Patient presents with   Rectal Bleeding   Constipation    Charles [name] is a 77 y.o. male.  77 year old male who presents ER today secondary to abdominal discomfort and decreased bowel movements.  Patient states he tried some Senokot at home but is increased abdominal pain with it.  Has had some drops of blood and is on Xarelto.  Still passing gas.  No emesis.  No fevers.   Rectal Bleeding Constipation Associated symptoms: hematochezia        Home Medications Prior to Admission medications   Medication Sig Start Date End Date Taking? Authorizing Provider  amoxicillin-clavulanate (AUGMENTIN) 875-125 MG tablet Take 1 tablet by mouth every 12 (twelve) hours. 06/02/23  Yes Raheel Kunkle, Barbara Cower, MD  docusate sodium (COLACE) 100 MG capsule Take 1 capsule (100 mg total) by mouth every 12 (twelve) hours. 06/02/23  Yes Destane Speas, Barbara Cower, MD  pantoprazole (PROTONIX) 20 MG tablet Take 1 tablet (20 mg total) by mouth daily for 14 days. 06/02/23 06/16/23 Yes Randell Teare, Barbara Cower, MD  polyethylene glycol (MIRALAX / GLYCOLAX) 17 g packet Take 17 g by mouth 2 (two) times daily as needed for severe constipation. 06/02/23  Yes Brailyn Delman, Barbara Cower, MD  acetaminophen (TYLENOL) 650 MG CR tablet Take 1,300 mg by mouth every 8 (eight) hours as needed (Arthritis).    [provider]  alfuzosin (UROXATRAL) 10 MG 24 hr tablet Take 10 mg by mouth every evening.     [provider]  aspirin EC 81 MG EC tablet Take 1 tablet (81 mg total) by mouth daily. 09/21/16   Gold, Deniece Portela E, PA-C  BD INSULIN SYRINGE U/F 30G X 1/2" 0.5 ML MISC USE TO INJECT INSULIN 3 TIMES DAILY 05/31/22   Reather Littler, MD  BD ULTRA-FINE PEN NEEDLES 29G X 12.7MM MISC USE DAILY TO INJECT TRESIBA INSULIN 09/27/22   Reather Littler, MD  carvedilol (COREG) 12.5 MG tablet TAKE 1 TABLET BY MOUTH 2 TIMES  DAILY. 12/10/22   Mealor, Roberts Gaudy, MD  cephALEXin (KEFLEX) 500 MG capsule Take by mouth. 12/24/22   [provider]  COLLAGEN PO Take 2,500 mg by mouth daily. With vitamin C    [provider]  Continuous Blood Gluc Receiver (FREESTYLE LIBRE 14 DAY READER) DEVI USE TO MONITOR BLOOD SUGAR 12/01/20   Reather Littler, MD  Continuous Blood Gluc Sensor (FREESTYLE LIBRE 14 DAY SENSOR) MISC APPLY 1 SENSOR EVERY 14 DAYS AS DIRECTED 10/07/21   Reather Littler, MD  cyanocobalamin 1000 MCG tablet Take 1,000 mcg by mouth daily.    [provider]  ENTRESTO 49-51 MG TAKE 1 TABLET BY MOUTH TWICE A DAY 11/03/22   Lennette Bihari, MD  ezetimibe (ZETIA) 10 MG tablet TAKE 1 TABLET BY MOUTH EVERY DAY 04/26/23   Reather Littler, MD  FARXIGA 5 MG TABS tablet TAKE 1 TABLET BY MOUTH EVERY DAY 04/12/23   Reather Littler, MD  fluticasone (FLONASE) 50 MCG/ACT nasal spray Place 1 spray into both nostrils daily as needed for allergies or rhinitis.    [provider]  furosemide (LASIX) 40 MG tablet Take 40 mg by mouth daily as needed for fluid or edema.    [provider]  gabapentin (NEURONTIN) 300 MG capsule TAKE 1 CAPSULE BY MOUTH THREE TIMES A DAY 12/10/21   Reather Littler, MD  Glucagon (GVOKE HYPOPEN  2-PACK) 1 MG/0.2ML SOAJ Inject to treat severe low blood sugars 06/30/22   Reather Littler, MD  insulin degludec (TRESIBA FLEXTOUCH) 100 UNIT/ML FlexTouch Pen INJECT 32 UNITS DAILY IN [facility] 03/14/23   Reather Littler, MD  latanoprost (XALATAN) 0.005 % ophthalmic solution Place 1 drop into both eyes at bedtime. 08/17/22   [provider]  meclizine (ANTIVERT) 25 MG tablet Take 25 mg by mouth 3 (three) times daily as needed (vertigo). 01/01/22   [provider]  metFORMIN (GLUCOPHAGE-XR) 500 MG 24 hr tablet TAKE 3 TABLETS BY MOUTH EVERY DAY WITH SUPPER 04/01/22   Reather Littler, MD  nitroGLYCERIN (NITROSTAT) 0.4 MG SL tablet PLACE 1 TABLET UNDER THE TONGUE EVERY 5 MINUTES AS NEEDED FOR CHEST PAIN 05/10/22    Lennette Bihari, MD  NOVOLOG 100 UNIT/ML injection INJECT 15-30 UNITS UNDER THE SKIN THREE TIMES DAILY BEFORE MEALS. Patient taking differently: 15 Units. 07/08/22   Reather Littler, MD  promethazine (PHENERGAN) 25 MG tablet Take 1 tablet by mouth every 8 (eight) hours as needed for nausea. 06/25/19   [provider]  tiZANidine (ZANAFLEX) 2 MG tablet TAKE 1 TABLET BY MOUTH EVERY 6 HOURS AS NEEDED FOR 10 DAYS 09/27/22   [provider]  triamcinolone cream (KENALOG) 0.1 % Apply 1 application. topically 2 (two) times daily as needed (rash). 12/07/21   [provider]  Vibegron (GEMTESA PO) Take by mouth.    [provider]  XARELTO 20 MG TABS tablet TAKE 1 TABLET BY MOUTH DAILY WITH SUPPER 03/14/23   Lennette Bihari, MD  zinc gluconate 50 MG tablet Take 50 mg by mouth daily.    [provider]      Allergies    Black pepper [piper], Chocolate, Codeine, Oxytetracycline, Statins, Latex, Other, and Tape    Review of Systems   Review of Systems  Gastrointestinal:  Positive for constipation and hematochezia.    Physical Exam Updated Vital Signs BP 135/77   Pulse 83   Temp 97.8 F (36.6 C) (Oral)   Resp 17   Ht 5\' 9"  (1.753 m)   Wt 124.3 kg   SpO2 97%   BMI 40.46 kg/m  Physical Exam Vitals and nursing note reviewed. Exam conducted with a chaperone present.  Constitutional:      Appearance: He is well-developed.  HENT:     Head: Normocephalic and atraumatic.  Cardiovascular:     Rate and Rhythm: Normal rate.  Pulmonary:     Effort: Pulmonary effort is normal. No respiratory distress.  Abdominal:     General: There is no distension.  Genitourinary:    Comments: Rectal exam with light brown diarrhea actively coming out. Has some denuded skin likely from poor hygiene. No impaction. Musculoskeletal:        General: Normal range of motion.     Cervical back: Normal range of motion.  Skin:    General: Skin is warm and dry.  Neurological:      General: No focal deficit present.     Mental Status: He is alert.     ED Results / Procedures / Treatments   Labs (all labs ordered are listed, but only abnormal results are displayed) Labs Reviewed  COMPREHENSIVE METABOLIC PANEL - Abnormal; Notable for the following components:      Result Value   CO2 20 (*)    Glucose, Bld 190 (*)    BUN 35 (*)    Creatinine, Ser 1.36 (*)    GFR, Estimated 54 (*)  All other components within normal limits  URINALYSIS, ROUTINE W REFLEX MICROSCOPIC - Abnormal; Notable for the following components:   Glucose, UA >=500 (*)    Bacteria, UA RARE (*)    All other components within normal limits  CBG MONITORING, ED - Abnormal; Notable for the following components:   Glucose-Capillary 179 (*)    All other components within normal limits  LIPASE, BLOOD  CBC    EKG EKG Interpretation Date/Time:  Thursday June 02 2023 05:54:20 EDT Ventricular Rate:  85 PR Interval:    QRS Duration:  164 QT Interval:  389 QTC Calculation: 438 R Axis:   175  Text Interpretation: Atrial fibrillation Right bundle branch block Lateral infarct, age indeterminate Confirmed by Marily Memos [phone]) on 06/02/2023 6:30:37 AM  Radiology CT ABDOMEN PELVIS W CONTRAST  Result Date: 06/02/2023 CLINICAL DATA:  77 year old male with bright red blood in stool and constipation for several days. EXAM: CT ABDOMEN AND PELVIS WITH CONTRAST TECHNIQUE: Multidetector CT imaging of the abdomen and pelvis was performed using the standard protocol following bolus administration of intravenous contrast. RADIATION DOSE REDUCTION: This exam was performed according to the departmental dose-optimization program which includes automated exposure control, adjustment of the mA and/or kV according to patient size and/or use of iterative reconstruction technique. CONTRAST:  75mL OMNIPAQUE IOHEXOL 350 MG/ML SOLN COMPARISON:  Noncontrast CT Abdomen and Pelvis 12/24/2022. CT Abdomen and Pelvis with  contrast 10/21/2015. FINDINGS: Lower chest: Cardiac pacemaker leads and extensive coronary artery stents. Mild cardiomegaly. No pericardial or pleural effusion. Lung bases are clear. Hepatobiliary: Chronic cholecystectomy. Liver otherwise stable since 2016 with scattered benign calcified hepatic granulomas and occasional small benign hepatic cysts (series 3, image 18 - no follow-up imaging recommended). Pancreas: Partially atrophied. Spleen: Stable.  Benign splenic calcified granulomas. Adrenals/Urinary Tract: Normal adrenal glands. Large chronic left renal midpole calculus, 15 mm on series 3, image 48. But no hydronephrosis. And symmetric renal contrast excretion from both kidneys. No other nephrolithiasis. Chronic nonspecific pararenal space stranding or fluid, present since 2016. Decompressed ureters. Mildly distended urinary bladder is otherwise unremarkable. Multiple pelvic phleboliths. Stomach/Bowel: Retained stool in the rectum with new mild perirectal fat stranding in the pelvis (series 3, image 85). Upstream distal sigmoid colon is decompressed. Redundant sigmoid. There is retained stool from the junction of the descending and sigmoid colon segments, throughout the upstream descending colon and transverse colon. Occasional diverticula in the descending colon. But no other large bowel inflammation. Diminutive or absent appendix. No dilated small bowel. Duodenal diverticulum of the 2nd portion without active inflammation on series 3, image 37. Small chronic hiatal hernia. No free air or free fluid. Vascular/Lymphatic: Aortoiliac calcified atherosclerosis. Suboptimal intravascular contrast bolus, but the major arterial structures in the abdomen and pelvis appear to remain patent. Grossly patent portal venous system. No lymphadenopathy. Reproductive: Negative. Other: No pelvis free fluid. Musculoskeletal: Previous sternotomy. Spine degeneration superimposed on chronic lower lumbar interbody fusion. No acute  osseous abnormality identified. IMPRESSION: 1. Constellation suspicious for rectal fecal impaction and Stercoral Colitis. Upstream redundant large bowel with additional retained stool and occasional diverticula. But no other bowel inflammation. 2. No other acute inflammatory process identified in the abdomen or pelvis. Chronic 15 mm left renal calculus. Aortic Atherosclerosis (ICD10-I70.0). Electronically Signed   By: Odessa Fleming M.D.   On: 06/02/2023 04:20    Procedures Procedures    Medications Ordered in ED Medications  lactated ringers bolus 1,000 mL (0 mLs Intravenous Stopped 06/02/23 0437)  iohexol (OMNIPAQUE) 350 MG/ML injection  75 mL (75 mLs Intravenous Contrast Given 06/02/23 0359)  pantoprazole (PROTONIX) EC tablet 40 mg (40 mg Oral Given 06/02/23 0604)  amoxicillin-clavulanate (AUGMENTIN) 875-125 MG per tablet 1 tablet (1 tablet Oral Given 06/02/23 4098)    ED Course/ Medical Decision Making/ A&P                             Medical Decision Making Amount and/or Complexity of Data Reviewed Radiology: ordered. ECG/medicine tests: ordered.  Risk OTC drugs. Prescription drug management.  Actively had a bowel movement while doing rectal exam. Underwear are soiled with feces. No obstruction. No impaction. Will get ct to evaluate for causes of pain. Soft pressure, will give some fluids. CT with question of rectal impaction with sterocolitis. Patietn having bowel movements including some loose stools and some 'logs'. Rectal exam still without impaction. No indication for disimpaction. Will start abx for sterocolitis. Will discuss appropriate bowel regimen at home.  Family states he syncopized on 7/5 after walking around outside and that was about the time constipation started and this was after he started lasix as he had too much water on his body. I suspect it was dehydration however will add on ecg to ensure no obvious cardiac abnormalities.  ECG ok. Doing well here. Ambulating well.  Stable for d/c.    Final Clinical Impression(s) / ED Diagnoses Final diagnoses:  Constipation, unspecified constipation type    Rx / DC Orders ED Discharge Orders          Ordered    docusate sodium (COLACE) 100 MG capsule  Every 12 hours        06/02/23 0640    polyethylene glycol (MIRALAX / GLYCOLAX) 17 g packet  2 times daily PRN        06/02/23 0640    amoxicillin-clavulanate (AUGMENTIN) 875-125 MG tablet  Every 12 hours        06/02/23 0640    pantoprazole (PROTONIX) 20 MG tablet  Daily        06/02/23 0702              Lorra Freeman, Barbara Cower, MD 06/02/23 [phone]

## 2023-06-02 NOTE — Discharge Instructions (Addendum)
The bleeding is likely related to inflammation of the rectum from constipation. This should improve now that you have had multiple large bowel movements. The antibiotics are for this so it doesn't get infected. If you have profuse bleeding, become weak, sob, fatigued for no obvious reason please return here. If you are feeling improved then follow up with your PCP.

## 2023-06-03 NOTE — Telephone Encounter (Signed)
Patient returned call & feels that symptoms have improved some. He had 3 bowel movements yesterday & so far 1 today. It's a mix of both formed & loose stool. No further bleeding. Prior to ED visit, he says he was in extreme pain. Rectal pain is still occasional and not as severe since being seen & starting Miralax BID. He is interested in scheduling a colonoscopy. Advised him I would give him a call back after Dr. Leone Payor has reviewed.

## 2023-06-03 NOTE — Telephone Encounter (Signed)
Inbound call from patient wishing to know what dosage of Miralax that he was advised to take. Requesting a call back. Please advise, thank you.

## 2023-06-03 NOTE — Telephone Encounter (Signed)
Left message for patient to call back  

## 2023-06-05 ENCOUNTER — Telehealth: Payer: Self-pay | Admitting: Physician Assistant

## 2023-06-05 NOTE — Telephone Encounter (Signed)
Patient called this afternoon, he had been in the ER couple of days ago with abdominal pain and obstipation. Had CT that did not show any obstruction but did show some possible stercoral colitis and a lot of stool in the left colon and rectum. He was instructed to take MiraLAX twice a day during that, he says he has had 2 small bowel movements since he has been home, today has not passed any stool. No fever, has been eating but is starting to feel full and uncomfortable again  Advised him to back off to a full liquid type diet for the rest of the day today and may be part of tomorrow Advised him to take an additional 3-4 doses of MiraLAX this afternoon q. 20 minutes which hopefully will act as a bowel purge.  Depending on results with that he may want to repeat with 4-5 doses of MiraLAX tomorrow.

## 2023-06-06 NOTE — Telephone Encounter (Signed)
He had some stools and feels better today  Advised:  1) 4 doses MiraLax today and tomorrow 2) Wed start 2 doses MiraLax every day 3) dulcolax 1-2 prn if not having BM's  4) Please get him an APP appointment for 7/22 using the held spots

## 2023-06-06 NOTE — Telephone Encounter (Signed)
Pt was contacted and notified pt of Dr. Leone Payor recommendations for an office visit with an APP. Pt was scheduled to see Mike Gip PA on 06/13/2023 at 1:30 PM. Pt was made aware.  Pt verbalized understanding with all questions answered.

## 2023-06-06 NOTE — Telephone Encounter (Signed)
Patient is following up to schedule.   Carotid doppler order expires on 06/11/23, but next available is on 8/08. Please update order.

## 2023-06-06 NOTE — Telephone Encounter (Signed)
Pt stated that he was questioning the dosage of the Miralax. Pt was given the dosage that was recommended on the package. 17 g which is 1 capfull. Pt verbalized understanding with all questions answered.

## 2023-06-06 NOTE — Telephone Encounter (Signed)
Order expiration date extended.  

## 2023-06-07 NOTE — Telephone Encounter (Signed)
Pt was contacted in regard to previous message.  Pt stated that he was returning a phone call from [place]. Pt was notified that Janine Limbo was off this week and the message was an old message. Chart reviewed and noted that myself and pt spoke yesterday whereas  pt was scheduled for an office visit on 06/13/2023. Pt reminded. Pt verbalized understanding with all questions answered.

## 2023-06-07 NOTE — Telephone Encounter (Signed)
PT returning call to speak about some issues he's having internally. Please advise.

## 2023-06-09 ENCOUNTER — Ambulatory Visit (HOSPITAL_COMMUNITY)
Admission: RE | Admit: 2023-06-09 | Discharge: 2023-06-09 | Disposition: A | Discharge status: Home / Self Care | Payer: Medicare Other | Source: Ambulatory Visit | Attending: Internal Medicine | Admitting: Internal Medicine

## 2023-06-09 DIAGNOSIS — I6523 Occlusion and stenosis of bilateral carotid arteries: Secondary | ICD-10-CM | POA: Insufficient documentation

## 2023-06-09 NOTE — Progress Notes (Signed)
Remote pacemaker transmission.   

## 2023-06-10 ENCOUNTER — Encounter: Payer: Self-pay | Admitting: Endocrinology

## 2023-06-10 ENCOUNTER — Ambulatory Visit (INDEPENDENT_AMBULATORY_CARE_PROVIDER_SITE_OTHER): Payer: Medicare Other | Admitting: Endocrinology

## 2023-06-10 ENCOUNTER — Telehealth (HOSPITAL_BASED_OUTPATIENT_CLINIC_OR_DEPARTMENT_OTHER): Payer: Self-pay

## 2023-06-10 VITALS — BP 112/70 | HR 71 | Ht 69.0 in | Wt 272.0 lb

## 2023-06-10 DIAGNOSIS — E782 Mixed hyperlipidemia: Secondary | ICD-10-CM | POA: Diagnosis not present

## 2023-06-10 DIAGNOSIS — Z794 Long term (current) use of insulin: Secondary | ICD-10-CM

## 2023-06-10 DIAGNOSIS — I6523 Occlusion and stenosis of bilateral carotid arteries: Secondary | ICD-10-CM

## 2023-06-10 DIAGNOSIS — E1165 Type 2 diabetes mellitus with hyperglycemia: Secondary | ICD-10-CM

## 2023-06-10 LAB — POCT GLUCOSE (DEVICE FOR HOME USE): POC Glucose: 186 mg/dl — AB (ref 70–99)

## 2023-06-10 NOTE — Telephone Encounter (Addendum)
Called results to patient and left results on VM (ok per DPR), instructions left to call office back if patient has any questions! Repeat testing ordered for one year.   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 Stable from previous. Repeat carotid duplex in one year for monitoring.

## 2023-06-10 NOTE — Patient Instructions (Signed)
18 Novolog with cereal
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
## 2023-06-11 ENCOUNTER — Encounter: Payer: Self-pay | Admitting: Endocrinology

## 2023-06-13 ENCOUNTER — Encounter: Payer: Self-pay | Admitting: Physician Assistant

## 2023-06-13 ENCOUNTER — Ambulatory Visit (INDEPENDENT_AMBULATORY_CARE_PROVIDER_SITE_OTHER): Payer: Medicare Other | Admitting: Physician Assistant

## 2023-06-13 ENCOUNTER — Telehealth: Payer: Self-pay

## 2023-06-13 VITALS — BP 110/60 | HR 61 | Ht 69.0 in | Wt 275.0 lb

## 2023-06-13 DIAGNOSIS — K5900 Constipation, unspecified: Secondary | ICD-10-CM

## 2023-06-13 DIAGNOSIS — K5641 Fecal impaction: Secondary | ICD-10-CM | POA: Diagnosis not present

## 2023-06-13 NOTE — Patient Instructions (Addendum)
_______________________________________________________  If your blood pressure at your visit was 140/90 or greater, please contact your primary care physician to follow up on this. _______________________________________________________  If you are age 76 or older, your body mass index should be between 23-30. Your Body mass index is 40.61 kg/m. If this is out of the aforementioned range listed, please consider follow up with your Primary Care Provider. ________________________________________________________  The [facility] providers would like to encourage you to use [facility] to communicate with providers for non-urgent requests or questions.  Due to long hold times on the telephone, sending your provider a message by [facility] may be a faster and more efficient way to get a response.  Please allow 48 business hours for a response.  Please remember that this is for non-urgent requests.  _______________________________________________________  Please purchase the following medications over the counter and take as directed:  START: Miralax 17grams twice daily CONTINUE: Colace one tablet twice daily  Please drink plenty of water 50 to 60 ounces daily  Please call to speak to Amy's nurse if recurrent constipation or pain.  Thank you for entrusting me with your care and choosing [facility].  Amy Esterwood, PA-C

## 2023-06-13 NOTE — Progress Notes (Addendum)
Subjective:    Patient ID: Charles [name], male    DOB: February 26, 1946, 77 y.o.   MRN: [id number]  HPI Charles [name]" is a 77 year old white male, established with Dr. Leone Payor who comes in today after recent ER visit on 06/01/2023 at which time he was complaining of rectal pain and constipation.  He says he had been having intermittent problems with constipation off and on for a long time and prior to that ER visit had been having issues with constipation and then went for 3 to 4 days with no bowel movement.  He developed constant rectal pain and had a little bit of rectal bleeding at home. CT of the abdomen pelvis was done on 06/02/2023 with finding of partial atrophy of the pancreas, status postcholecystectomy, there is a left renal stone present, he had retained stool in the rectum through to the transverse colon, stool in the rectum felt consistent with fecal impaction and there was surrounding mild perirectal fat stranding in the pelvis felt to be consistent with a stercoral colitis. Labs show WBC of 10.1/hemoglobin 15.7 Sodium 136/potassium 4.6/BUN 35/creatinine 1.36 LFTs within normal limits  Patient says it was a miserable ER visit and he waited about 20 hours to be seen.  When he was examined by the ER physician with digital exam he started having a bowel movement and had some manual disimpaction by the ER physician and then had some good bowel movements.  He was advised to stay on his stool softener/Colace 3 tablets daily in total 2 in the morning and 1 at night and has been taking MiraLAX 17 g in 8 ounces of water twice daily since then. He is currently not having any abdominal pain, he says his rectal discomfort completely resolved after he started having bowel movements, he is not having any rectal bleeding and is having formed bowel movements on a daily basis.  His last colonoscopy had been done in May 2018 per Dr. Leone Payor with removal of one 3 mm rectal polyp which was a diminutive adenoma  and at that time was indicated for 5-year follow-up which by current recommendations would be 7 to 10-year follow-up. Patient has numerous comorbidities including atrial fibrillation for which she is on Xarelto, coronary artery disease status post prior MI, CABG x 4 Congestive heart failure with most recent echo showing EF of 45 to 50% and a severely dilated left and right atrium Sick sinus syndrome status post pacemaker Sleep apnea with CPAP use Chronic kidney disease stage III and adult onset diabetes mellitus in addition to morbid obesity with BMI of 40.   Review of Systems Pertinent positive and negative review of systems were noted in the above HPI section.  All other review of systems was otherwise negative.   Outpatient Encounter Medications as of 06/13/2023  Medication Sig   acetaminophen (TYLENOL) 650 MG CR tablet Take 1,300 mg by mouth every 8 (eight) hours as needed (Arthritis).   alfuzosin (UROXATRAL) 10 MG 24 hr tablet Take 10 mg by mouth every evening.    aspirin EC 81 MG EC tablet Take 1 tablet (81 mg total) by mouth daily.   BD INSULIN SYRINGE U/F 30G X 1/2" 0.5 ML MISC USE TO INJECT INSULIN 3 TIMES DAILY   BD ULTRA-FINE PEN NEEDLES 29G X 12.7MM MISC USE DAILY TO INJECT TRESIBA INSULIN   carvedilol (COREG) 12.5 MG tablet TAKE 1 TABLET BY MOUTH 2 TIMES DAILY.   cephALEXin (KEFLEX) 500 MG capsule Take by mouth.   COLLAGEN  PO Take 2,500 mg by mouth daily. With vitamin C   Continuous Blood Gluc Receiver (FREESTYLE LIBRE 14 DAY READER) DEVI USE TO MONITOR BLOOD SUGAR   Continuous Blood Gluc Sensor (FREESTYLE LIBRE 14 DAY SENSOR) MISC APPLY 1 SENSOR EVERY 14 DAYS AS DIRECTED   cyanocobalamin 1000 MCG tablet Take 1,000 mcg by mouth daily.   docusate sodium (COLACE) 100 MG capsule Take 1 capsule (100 mg total) by mouth every 12 (twelve) hours.   ENTRESTO 49-51 MG TAKE 1 TABLET BY MOUTH TWICE A DAY   ezetimibe (ZETIA) 10 MG tablet TAKE 1 TABLET BY MOUTH EVERY DAY   FARXIGA 5 MG TABS  tablet TAKE 1 TABLET BY MOUTH EVERY DAY   fluticasone (FLONASE) 50 MCG/ACT nasal spray Place 1 spray into both nostrils daily as needed for allergies or rhinitis.   furosemide (LASIX) 40 MG tablet Take 40 mg by mouth daily as needed for fluid or edema.   gabapentin (NEURONTIN) 300 MG capsule TAKE 1 CAPSULE BY MOUTH THREE TIMES A DAY   Glucagon (GVOKE HYPOPEN 2-PACK) 1 MG/0.2ML SOAJ Inject to treat severe low blood sugars   insulin degludec (TRESIBA FLEXTOUCH) 100 UNIT/ML FlexTouch Pen INJECT 32 UNITS DAILY IN MORNING   latanoprost (XALATAN) 0.005 % ophthalmic solution Place 1 drop into both eyes at bedtime.   meclizine (ANTIVERT) 25 MG tablet Take 25 mg by mouth 3 (three) times daily as needed (vertigo).   metFORMIN (GLUCOPHAGE-XR) 500 MG 24 hr tablet TAKE 3 TABLETS BY MOUTH EVERY DAY WITH SUPPER   nitroGLYCERIN (NITROSTAT) 0.4 MG SL tablet PLACE 1 TABLET UNDER THE TONGUE EVERY 5 MINUTES AS NEEDED FOR CHEST PAIN   NOVOLOG 100 UNIT/ML injection INJECT 15-30 UNITS UNDER THE SKIN THREE TIMES DAILY BEFORE MEALS. (Patient taking differently: 15 Units.)   pantoprazole (PROTONIX) 20 MG tablet Take 1 tablet (20 mg total) by mouth daily for 14 days.   polyethylene glycol (MIRALAX / GLYCOLAX) 17 g packet Take 17 g by mouth 2 (two) times daily as needed for severe constipation.   promethazine (PHENERGAN) 25 MG tablet Take 1 tablet by mouth every 8 (eight) hours as needed for nausea.   tiZANidine (ZANAFLEX) 2 MG tablet TAKE 1 TABLET BY MOUTH EVERY 6 HOURS AS NEEDED FOR 10 DAYS   triamcinolone cream (KENALOG) 0.1 % Apply 1 application. topically 2 (two) times daily as needed (rash).   Vibegron (GEMTESA PO) Take by mouth.   XARELTO 20 MG TABS tablet TAKE 1 TABLET BY MOUTH DAILY WITH SUPPER   zinc gluconate 50 MG tablet Take 50 mg by mouth daily.   No facility-administered encounter medications on file as of 06/13/2023.   Allergies  Allergen Reactions   Black Pepper [Piper] Other (See Comments)    Irritates  back of throat   Chocolate Hives, Shortness Of Breath and Swelling    Dark chocolate   Codeine Itching and Swelling   Oxytetracycline Other (See Comments)    Flushing in sunlight   Statins Other (See Comments)    Mental changes, muscle aches   Latex Itching and Other (See Comments)    Sensitive skin   Other Other (See Comments)    Opioids mess up his Stomach   Tape Rash and Other (See Comments)    SKIN IS VERY SENSITIVE!!   Patient Active Problem List   Diagnosis Date Noted   Constipation 01/03/2023   Persistent atrial fibrillation (HCC) 01/27/2022   Secondary hypercoagulable state (HCC) 01/27/2022   History of peristent atrial fibrillation  Pain of left hand 05/23/2018   Pressure injury of skin 04/04/2018   Cough    Acute on chronic systolic and diastolic heart failure, NYHA class 1 (HCC) 03/31/2018   Back pain due to injury 03/31/2018   URI with cough and congestion 03/31/2018   CHF exacerbation (HCC) 03/31/2018   Congestive heart failure (HCC) 03/31/2018   Leg edema    Pain in joint of right shoulder 03/13/2018   Pain in right knee 03/13/2018   Atrial fibrillation with RVR (HCC) 10/10/2017   Diabetic peripheral neuropathy (HCC) 03/21/2017   Nail dystrophy 03/21/2017   Toenail fungus 03/21/2017   Pyohydronephrosis 01/01/2017   CKD (chronic kidney disease), stage III (HCC) 01/01/2017   Kidney stone 01/01/2017   Bacteremia 12/23/2016   S/P CABG x 4 09/15/2016   CAD in native artery    Cardiomyopathy, ischemic    Unstable angina (HCC) 09/07/2016   Coronary artery disease involving native coronary artery of native heart    Uncontrolled type 2 diabetes mellitus with complication    Chronic combined systolic and diastolic CHF (congestive heart failure) (HCC)    Sick sinus syndrome (HCC)    OSA on CPAP    Chronic pain syndrome    Essential hypertension    Chest pain 08/03/2016   Chronic venous insufficiency 07/28/2016   Cardiac pacemaker in situ 07/28/2016   NSTEMI  (non-ST elevated myocardial infarction) (HCC) 07/28/2016   Old inferior wall myocardial infarction    Gastroesophageal reflux disease without esophagitis 02/05/2016   Type II diabetes mellitus (HCC) 02/05/2016   PAF (paroxysmal atrial fibrillation) (HCC) 03/10/2013   Long term (current) use of anticoagulants 03/10/2013   Hx of adenomatous polyp of colon 02/18/2010   Hyperlipidemia 01/03/2008   Obesity 01/03/2008   Hypertensive heart disease without CHF    Social History   Socioeconomic History   Marital status: Married    Spouse name: Not on file   Number of children: 1   Years of education: Not on file   Highest education level: Not on file  Occupational History   Occupation: Market researcher    Comment[place]  Tobacco Use   Smoking status: Former    Current packs/day: 0.00    Average packs/day: 1 pack/day for 5.0 years (5.0 ttl pk-yrs)    Types: Cigarettes    Start date: 11/22/1969    Quit date: 11/22/1974    Years since quitting: 48.5   Smokeless tobacco: Never   Tobacco comments:    Former smoker 01/27/2022  Vaping Use   Vaping status: Never Used  Substance and Sexual Activity   Alcohol use: No    Alcohol/week: 0.0 standard drinks of alcohol   Drug use: Never   Sexual activity: Not Currently  Other Topics Concern   Not on file  Social History Narrative   Married lives with wife 1 grown child   Retired  Journalist, 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 [employer]   Former smoker no alcohol tobacco or drugs at this point   epworth scale score: 8    Social Determinants of Corporate investment banker Strain: Not on file  Food Insecurity: Not on file  Transportation Needs: Not on file  Physical Activity: Not on file  Stress: Not on file  Social Connections: Not on file  Intimate Partner Violence: Not on file    Charles [name] family history includes Arthritis in his sister; Epilepsy in his brother; Heart attack (age of onset: 53) in his mother; Lung  cancer in his maternal  grandfather; Neuropathy in his brother; Stroke in his maternal grandmother and paternal grandfather.      Objective:    Vitals:   06/13/23 1336  BP: 110/60  Pulse: 61    Physical Exam Well-developed elderly obese white male in no acute distress.  Height, Weight, 275 BMI 40.6  HEENT; nontraumatic normocephalic, EOMI, PE R LA, sclera anicteric. Oropharynx; Neck; supple, no JVD  CV; irregular rate and rhythm with S1-S2, no murmur rub or gallop Pulmonary; Clear bilaterally Abdomen; soft, morbidly obese nontender, nondistended, no palpable mass or hepatosplenomegaly, bowel sounds are active Rectal; not done today, done with recent ER visit Skin; benign exam, no jaundice rash or appreciable lesions Extremities; no clubbing cyanosis or edema skin warm and dry Neuro/Psych; alert and oriented x4, grossly nonfocal mood and affect appropriate        Assessment & Plan:   #66 77 year old white male with recent ER visit for obstipation/fecal impaction, and CT that showed probable stercoral colitis type picture with some mild perirectal fat stranding in the pelvis. He was treated with a short course of antibiotic and had bowel movements with digital exam and then some disimpaction per ER physician.  He was having a lot of rectal pain when he presented to the emergency room.  He says rectal pain resolved within the first 24 hours and has not recurred, he is not having any rectal bleeding, and with the use of Colace, and 2 doses of MiraLAX daily he is having formed bowel movements on a daily basis.  #2 Last colonoscopy May 2018 with removal of one 3 mm rectal polyp which was a diminutive tubular adenoma.  By current recommendations he would have been indicated for follow-up colonoscopy in 7 to 10 years, however at age 19 may not want to pursue any further colonoscopies.  #3 atrial fibrillation-on Xarelto 4.  Chronic anticoagulation 5.  Coronary artery disease status post MI and CABG x 4 6.   Congestive heart failure/ischemic cardiomyopathy with echo March 2023 showing EF of 45 to 50% and a severely dilated left and right atrium 7.  Chronic kidney disease stage III 8.  Sleep apnea with CPAP use 9.  Sick sinus syndrome status post pacemaker 10.  Adult onset diabetes mellitus 11.  Morbid obesity with BMI of 40  Plan; We discussed continuing MiraLAX 17 g in 8 ounces of water twice daily and Colace 2 to 3 tablets daily in divided doses.  Patient was advised that he can double up on the MiraLAX as needed if he does not have a bowel movement every 1 to 2 days. I do not feel that he has to have a repeat colonoscopy at this time, and with his multiple comorbidities is at high risk for complications with sedation.  He is now 60, and recommendations are not to continue screening colonoscopies after age 42. His symptoms completely resolved and have not recurred since his ER visit with obstipation and fecal impaction. Patient and wife were concerned as they were told when he was seen in the ER that he would likely need another colonoscopy.  Will discuss with Dr. Leone Payor for final determination.   Oswald Hillock PA-C 06/13/2023   Cc: [name], PA-C    Primary GI MD  Agree that there is not a need to perform colonoscopy - previosuly discussed. Iva Boop, MD, [facility]

## 2023-06-13 NOTE — Telephone Encounter (Signed)
Patient stated you would like to have the number to [facility] for the sensors. [phone].   Is there something you need me to do with this information?

## 2023-06-14 ENCOUNTER — Telehealth: Payer: Self-pay

## 2023-06-14 NOTE — Telephone Encounter (Signed)
-----   Message from Mike Gip sent at 06/14/2023  1:03 PM EDT ----- Regarding: FW: need for Colonoscopy ?? Beth, I saw this pt yesterday - please let pt and wife know that Dr  Leone Payor reviewed everything and agrees with Amy that pt does not need to have a Colonoscopy- continue  his current bowel regimen with colace and miralax, and  call and ask for nurse for  any problems ----- Message ----- From: Iva Boop, MD Sent: 06/14/2023   8:13 AM EDT To: Sammuel Cooper, PA-C Subject: RE: need for Colonoscopy ??                    I agree completely with your assessment and plan.  No colonoscopy at this time.  Thanks.  Baldo Ash ----- Message ----- From: Peterson Ao Sent: 06/13/2023   4:24 PM EDT To: Iva Boop, MD Subject: need for Colonoscopy ??                        Baldo Ash, please review my note from 06/13/2023 on this patient and advise whether you think we should proceed with another colonoscopy  I did not think so for the reasons outlined in my note but told patient and his wife that I would confirm with you Thanks, Amy

## 2023-06-14 NOTE — Telephone Encounter (Signed)
Called the patient. Spoke with patient and wife on speaker phone. Relayed the message. Invited questions. No questions at this time. Patient and wife thanks me for the call.

## 2023-06-20 ENCOUNTER — Telehealth: Payer: Self-pay | Admitting: Cardiovascular Disease

## 2023-06-20 NOTE — Telephone Encounter (Signed)
Patient states he is following up regarding the status of a CPAP order.

## 2023-06-21 NOTE — Telephone Encounter (Signed)
Forwarded to sleep study nurse. Please call pt to let him know.  Patient called back for update because no one return his call. Provide patient with [facility] number [phone]. Please advise

## 2023-06-21 NOTE — Telephone Encounter (Signed)
Patient called back for update because no one return his call. Provide patient with [employer] number [phone]. Please advise

## 2023-06-27 ENCOUNTER — Ambulatory Visit: Payer: Medicare Other | Attending: Cardiovascular Disease

## 2023-06-27 DIAGNOSIS — Z950 Presence of cardiac pacemaker: Secondary | ICD-10-CM

## 2023-06-27 DIAGNOSIS — I5042 Chronic combined systolic (congestive) and diastolic (congestive) heart failure: Secondary | ICD-10-CM | POA: Diagnosis not present

## 2023-06-30 ENCOUNTER — Telehealth: Payer: Self-pay

## 2023-06-30 NOTE — Progress Notes (Signed)
EPIC Encounter for ICM Monitoring  Patient Name: Charles [name] is a 77 y.o. male Date: 06/30/2023 Primary Care Physican: [name], PA-C Primary Cardiologist: Tresa Endo Electrophysiologist: Mealor Bi-V Pacing: 93%  03/30/2023 Office weight: 280 lbs 05/24/2023 Weight:  271 lbs   AT/AF Burden: 4.3% (taking Xarelto)                                                          Attempted call to patient and unable to reach.  Left detailed message per DPR regarding transmission. Transmission reviewed.    CorVue thoracic impedance suggesting intermittent days with possible fluid accumulation within the last month.     Prescribed: Furosemide 40 mg take 1 tablet (40 mg total) by mouth daily as needed Farxiga 50 mg take 1 tablet daily Xarelto 20 mg take 1 tablet at supper daily   Labs: 01/31/2023 Creatinine 1.14, BUN 19, Potassium 5.1, Sodium 143, GFR 67 A complete set of results can be found in Results Review.   Recommendations:  Left voice mail with ICM number and encouraged to call if experiencing any fluid symptoms.   Follow-up plan: ICM clinic phone appointment on 08/01/2023.   91 day device clinic remote transmission 08/16/2023.   EP/Cardiology Office Visits:  Recall 12/01/2023 with Otilio Saber, PA   Copy of ICM check sent to Dr. Nelly Laurence.   3 month ICM trend: 06/27/2023.    12-14 Month ICM trend:     [name], RN 06/30/2023 2:06 PM

## 2023-06-30 NOTE — Telephone Encounter (Signed)
Remote ICM transmission received.  Attempted call to patient regarding ICM remote transmission and left detailed message per DPR.  Left ICM phone number and advised to return call for any fluid symptoms or questions. Next ICM remote transmission scheduled 08/01/2023.

## 2023-07-04 ENCOUNTER — Telehealth: Payer: Self-pay | Admitting: Cardiovascular Disease

## 2023-07-04 ENCOUNTER — Telehealth: Payer: Self-pay | Admitting: Physician Assistant

## 2023-07-04 MED ORDER — DOCUSATE SODIUM 100 MG PO CAPS: 100.0000 mg | ORAL_CAPSULE | Freq: Two times a day (BID) | ORAL | 1 refills | Status: DC

## 2023-07-04 NOTE — Telephone Encounter (Signed)
Pt is requesting a callback regarding him receiving his BiPAP machine. Please advise

## 2023-07-04 NOTE — Telephone Encounter (Signed)
Inbound call from patient stating last time he was at ED he was given docusate sodium. Patient is requesting a call back to discuss if he is able to have a refill for medication. Please advise, thank you.

## 2023-07-04 NOTE — Telephone Encounter (Signed)
Will route to sleep studies team.  Thanks!

## 2023-07-04 NOTE — Telephone Encounter (Signed)
I called and spoke to the patient and he is actually Dr Marvell Fuller patient.. He had called to speak to Dr Marvell Fuller nurse.  He just wanted an Rx of Docusate Sodium 100 mg soft Gel capsules to take 1 every 12 hours as needed sent to [facility] in [place].  This Rx was given to him when he was at the ED and they help him Patient had no complaints of pain or n/v.  Sent Rx electronically

## 2023-07-08 ENCOUNTER — Telehealth: Payer: Self-pay

## 2023-07-08 ENCOUNTER — Other Ambulatory Visit: Payer: Self-pay

## 2023-07-08 MED ORDER — FUROSEMIDE 40 MG PO TABS: 40.0000 mg | ORAL_TABLET | Freq: Every day | ORAL | 2 refills | Status: AC | PRN

## 2023-07-08 NOTE — Patient Outreach (Signed)
  Care Coordination   Initial Visit Note   07/08/2023 Name: Charles [name] MRN: [id number] DOB: 05-Dec-1945  Charles [name] is a 77 y.o. year old male who sees [name], [place] for primary care. I spoke with  [name] by phone today.  What matters to the patients health and wellness today?  Placed call to patient to review and offer [facility] care coordination program. Patient reports that he is doing well and denies any needs today.    SDOH assessments and interventions completed:  No     Care Coordination Interventions:  No, not indicated   Follow up plan: No further intervention required.   Encounter Outcome:  Pt. Refused   Rowe Pavy, RN, BSN, CEN [facility] [phone]

## 2023-07-08 NOTE — Telephone Encounter (Signed)
Pt's medication was sent to pt's pharmacy as requested. Confirmation received.  °

## 2023-07-21 ENCOUNTER — Other Ambulatory Visit: Payer: Self-pay | Admitting: Endocrinology

## 2023-07-26 ENCOUNTER — Other Ambulatory Visit: Payer: Self-pay | Admitting: Endocrinology

## 2023-08-01 ENCOUNTER — Ambulatory Visit: Payer: Medicare Other | Attending: Cardiovascular Disease

## 2023-08-01 ENCOUNTER — Telehealth: Payer: Self-pay

## 2023-08-01 DIAGNOSIS — Z950 Presence of cardiac pacemaker: Secondary | ICD-10-CM | POA: Diagnosis not present

## 2023-08-01 DIAGNOSIS — I5042 Chronic combined systolic (congestive) and diastolic (congestive) heart failure: Secondary | ICD-10-CM

## 2023-08-01 NOTE — Telephone Encounter (Signed)
Remote ICM transmission received.  Attempted call to patient regarding ICM remote transmission and left detailed message per DPR.  Left ICM phone number and advised to return call for any fluid symptoms or questions. Next ICM remote transmission scheduled 09/05/2023.

## 2023-08-01 NOTE — Progress Notes (Signed)
EPIC Encounter for ICM Monitoring  Patient Name: Charles [name] is a 77 y.o. male Date: 08/01/2023 Primary Care Physican: [name], PA-C Primary Cardiologist: Tresa Endo Electrophysiologist: Mealor Bi-V Pacing: 93%  03/30/2023 Office weight: 280 lbs 05/24/2023 Weight:  271 lbs   AT/AF Burden: 4.2% (taking Xarelto)                                                          Attempted call to patient and unable to reach.  Left detailed message per DPR regarding transmission. Transmission reviewed.    CorVue thoracic impedance suggesting intermittent days with possible fluid accumulation within the last month.     Prescribed: Furosemide 40 mg take 1 tablet (40 mg total) by mouth daily as needed Farxiga 50 mg take 1 tablet daily Xarelto 20 mg take 1 tablet at supper daily   Labs: 01/31/2023 Creatinine 1.14, BUN 19, Potassium 5.1, Sodium 143, GFR 67 A complete set of results can be found in Results Review.   Recommendations:  Left voice mail with ICM number and encouraged to call if experiencing any fluid symptoms.   Follow-up plan: ICM clinic phone appointment on 09/05/2023.   91 day device clinic remote transmission 08/16/2023.   EP/Cardiology Office Visits:  Recall 12/01/2023 with Otilio Saber, PA   Copy of ICM check sent to Dr. Nelly Laurence.   3 month ICM trend: 08/01/2023.    12-14 Month ICM trend:     [name], RN 08/01/2023 2:44 PM

## 2023-08-10 ENCOUNTER — Encounter (HOSPITAL_COMMUNITY): Payer: Self-pay | Admitting: Emergency Medicine

## 2023-08-10 ENCOUNTER — Emergency Department (HOSPITAL_COMMUNITY)
Admission: EM | Admit: 2023-08-10 | Discharge: 2023-08-11 | Disposition: A | Discharge status: Home / Self Care | Payer: Medicare Other | Attending: Emergency Medicine | Admitting: Emergency Medicine

## 2023-08-10 ENCOUNTER — Emergency Department (HOSPITAL_COMMUNITY): Payer: Medicare Other

## 2023-08-10 ENCOUNTER — Other Ambulatory Visit: Payer: Self-pay

## 2023-08-10 DIAGNOSIS — R55 Syncope and collapse: Secondary | ICD-10-CM | POA: Diagnosis present

## 2023-08-10 DIAGNOSIS — Z7984 Long term (current) use of oral hypoglycemic drugs: Secondary | ICD-10-CM | POA: Diagnosis not present

## 2023-08-10 DIAGNOSIS — Z794 Long term (current) use of insulin: Secondary | ICD-10-CM | POA: Diagnosis not present

## 2023-08-10 DIAGNOSIS — Z7901 Long term (current) use of anticoagulants: Secondary | ICD-10-CM | POA: Diagnosis not present

## 2023-08-10 DIAGNOSIS — Z79899 Other long term (current) drug therapy: Secondary | ICD-10-CM | POA: Diagnosis not present

## 2023-08-10 DIAGNOSIS — Z950 Presence of cardiac pacemaker: Secondary | ICD-10-CM | POA: Insufficient documentation

## 2023-08-10 DIAGNOSIS — N189 Chronic kidney disease, unspecified: Secondary | ICD-10-CM | POA: Diagnosis not present

## 2023-08-10 DIAGNOSIS — R0602 Shortness of breath: Secondary | ICD-10-CM | POA: Insufficient documentation

## 2023-08-10 DIAGNOSIS — Z91040 Latex allergy status: Secondary | ICD-10-CM | POA: Insufficient documentation

## 2023-08-10 DIAGNOSIS — E119 Type 2 diabetes mellitus without complications: Secondary | ICD-10-CM | POA: Diagnosis not present

## 2023-08-10 DIAGNOSIS — I509 Heart failure, unspecified: Secondary | ICD-10-CM | POA: Insufficient documentation

## 2023-08-10 DIAGNOSIS — Z955 Presence of coronary angioplasty implant and graft: Secondary | ICD-10-CM | POA: Insufficient documentation

## 2023-08-10 DIAGNOSIS — I4891 Unspecified atrial fibrillation: Secondary | ICD-10-CM | POA: Insufficient documentation

## 2023-08-10 DIAGNOSIS — I130 Hypertensive heart and chronic kidney disease with heart failure and stage 1 through stage 4 chronic kidney disease, or unspecified chronic kidney disease: Secondary | ICD-10-CM | POA: Diagnosis not present

## 2023-08-10 DIAGNOSIS — Z7982 Long term (current) use of aspirin: Secondary | ICD-10-CM | POA: Insufficient documentation

## 2023-08-10 LAB — CBC WITH DIFFERENTIAL/PLATELET
Abs Immature Granulocytes: 0.09 10*3/uL — ABNORMAL HIGH (ref 0.00–0.07)
Basophils Absolute: 0 10*3/uL (ref 0.0–0.1)
Basophils Relative: 1 %
Eosinophils Absolute: 0.3 10*3/uL (ref 0.0–0.5)
Eosinophils Relative: 3 %
HCT: 47.6 % (ref 39.0–52.0)
Hemoglobin: 15.7 g/dL (ref 13.0–17.0)
Immature Granulocytes: 1 %
Lymphocytes Relative: 14 %
Lymphs Abs: 1.1 10*3/uL (ref 0.7–4.0)
MCH: 31.3 pg (ref 26.0–34.0)
MCHC: 33 g/dL (ref 30.0–36.0)
MCV: 94.8 fL (ref 80.0–100.0)
Monocytes Absolute: 0.5 10*3/uL (ref 0.1–1.0)
Monocytes Relative: 6 %
Neutro Abs: 6.2 10*3/uL (ref 1.7–7.7)
Neutrophils Relative %: 75 %
Platelets: 199 10*3/uL (ref 150–400)
RBC: 5.02 MIL/uL (ref 4.22–5.81)
RDW: 14.1 % (ref 11.5–15.5)
WBC: 8.2 10*3/uL (ref 4.0–10.5)
nRBC: 0 % (ref 0.0–0.2)

## 2023-08-10 LAB — COMPREHENSIVE METABOLIC PANEL WITH GFR
ALT: 24 U/L (ref 0–44)
AST: 21 U/L (ref 15–41)
Albumin: 3.5 g/dL (ref 3.5–5.0)
Alkaline Phosphatase: 57 U/L (ref 38–126)
Anion gap: 9 (ref 5–15)
BUN: 19 mg/dL (ref 8–23)
CO2: 20 mmol/L — ABNORMAL LOW (ref 22–32)
Calcium: 8.8 mg/dL — ABNORMAL LOW (ref 8.9–10.3)
Chloride: 108 mmol/L (ref 98–111)
Creatinine, Ser: 1.04 mg/dL (ref 0.61–1.24)
GFR, Estimated: >60 mL/min (ref >60)
Glucose, Bld: 110 mg/dL — ABNORMAL HIGH (ref 70–99)
Potassium: 3.9 mmol/L (ref 3.5–5.1)
Sodium: 137 mmol/L (ref 135–145)
Total Bilirubin: 0.7 mg/dL (ref 0.3–1.2)
Total Protein: 6.9 g/dL (ref 6.5–8.1)

## 2023-08-10 NOTE — ED Provider Triage Note (Signed)
Emergency Medicine Provider Triage Evaluation Note  Charles [name] , a 77 y.o. male  was evaluated in triage.  Pt complains of near syncope.  He has been dealing with this for over a year.  He had 3 recurrent episodes this morning which caused some concern.  States it feels like someone is pulling a shade down over his head.  He denies any prodromal chest pain or shortness of breath.  States he was switched from CPAP to BiPAP recently and believes this may be contributing  Review of Systems  Positive: As above Negative: As above  Physical Exam  BP 130/88 (BP Location: Right Arm)   Pulse 72   Temp 97.7 F (36.5 C) (Oral)   Resp 16   Ht 5\' 9"  (1.753 m)   Wt 124.7 kg   SpO2 97%   BMI 40.61 kg/m  Gen:   Awake, no distress   Resp:  Normal effort  MSK:   Moves extremities without difficulty Other:  No obvious murmur  Medical Decision Making  Medically screening exam initiated at 1:11 PM.  Appropriate orders placed.  [name] was informed that the remainder of the evaluation will be completed by another provider, this initial triage assessment does not replace that evaluation, and the importance of remaining in the ED until their evaluation is complete.  Workup initiated   Michelle Piper, Cordelia Poche 08/10/23 1312

## 2023-08-10 NOTE — ED Triage Notes (Signed)
Patient arrives in wheelchair by POV states he has been dealing with these near syncopal episodes for over a year now. Patient states it feels like a shade coming over him but usually deep breathing will resolve symptoms. Patient states he had one incident yesterday and three of them today. States ems came to house to evaluate patient but did not want to go to [place] so came here by POV

## 2023-08-10 NOTE — ED Provider Notes (Signed)
[place] EMERGENCY DEPARTMENT AT [facility] Provider Note   CSN: [id number] Arrival date & time: 08/10/23  1226     History {Add pertinent medical, surgical, social history, OB history to HPI:1} Chief Complaint  Patient presents with   Near Syncope    Charles [name] is a 77 y.o. male.  HPI   Patient with medical history including hypertension, CHF, diabetes type 2, CKD, atrial fibrillation currently on Eliquis, status post CABG, sick sinus status post permanent pacemaker, presenting with complaints of near syncope.  Patient states that this been going on for about a years time but over the last 2 days has become more frequent.  He states that yesterday he got up was about to make breakfast and felt like he was going to pass out.  He describes it as the curtains pulling down on his vision bilaterally, felt short of breath, he states that he controls his breathing and then resolves on its own, he states this is typically how these events occur, will last less than a minute he has no associate chest pain, lightheaded, chest pain, become diaphoretic, nausea or vomiting, paresthesia or weakness Lower extremities.  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  Patient states that he had these events 2 times later that day, and then while he was in the ER he had them again, the events in the ER happened when he got up went to use the restroom and came back and was having the symptoms, they happen again while he is walking to the hospital bed, he was able to control his breathing and then symptoms went away.  He denies any worsening peripheral edema, orthopnea, has been compliant with his Xarelto, denies any recent fevers chills cough congestion, no active chest pain or current shortness of breath, no stomach pains no nausea or vomiting.  Home Medications Prior to Admission medications   Medication Sig Start Date End Date Taking? Authorizing Provider  acetaminophen (TYLENOL) 650 MG CR tablet Take  1,300 mg by mouth every 8 (eight) hours as needed (Arthritis).    [provider]  alfuzosin (UROXATRAL) 10 MG 24 hr tablet Take 10 mg by mouth every evening.     [provider]  aspirin EC 81 MG EC tablet Take 1 tablet (81 mg total) by mouth daily. 09/21/16   Gold, Deniece Portela E, PA-C  BD INSULIN SYRINGE U/F 30G X 1/2" 0.5 ML MISC USE TO INJECT INSULIN 3 TIMES DAILY 05/31/22   Reather Littler, MD  BD ULTRA-FINE PEN NEEDLES 29G X 12.7MM MISC USE DAILY TO INJECT TRESIBA INSULIN 09/27/22   Reather Littler, MD  carvedilol (COREG) 12.5 MG tablet TAKE 1 TABLET BY MOUTH 2 TIMES DAILY. 12/10/22   Mealor, Roberts Gaudy, MD  cephALEXin (KEFLEX) 500 MG capsule Take by mouth. 12/24/22   [provider]  COLLAGEN PO Take 2,500 mg by mouth daily. With vitamin C    [provider]  Continuous Blood Gluc Receiver (FREESTYLE LIBRE 14 DAY READER) DEVI USE TO MONITOR BLOOD SUGAR 12/01/20   Reather Littler, MD  Continuous Blood Gluc Sensor (FREESTYLE LIBRE 14 DAY SENSOR) MISC APPLY 1 SENSOR EVERY 14 DAYS AS DIRECTED 10/07/21   Reather Littler, MD  cyanocobalamin 1000 MCG tablet Take 1,000 mcg by mouth daily.    [provider]  docusate sodium (COLACE) 100 MG capsule Take 1 capsule (100 mg total) by mouth every 12 (twelve) hours. As needed 07/04/23   Iva Boop, MD  ENTRESTO 49-51 MG  TAKE 1 TABLET BY MOUTH TWICE A DAY 11/03/22   Lennette Bihari, MD  ezetimibe (ZETIA) 10 MG tablet TAKE 1 TABLET BY MOUTH EVERY DAY 04/26/23   Reather Littler, MD  FARXIGA 5 MG TABS tablet TAKE 1 TABLET BY MOUTH EVERY DAY 04/12/23   Reather Littler, MD  fluticasone [facility]) 50 MCG/ACT nasal spray Place 1 spray into both nostrils daily as needed for allergies or rhinitis.    [provider]  furosemide (LASIX) 40 MG tablet Take 1 tablet (40 mg total) by mouth daily as needed for fluid or edema. 07/08/23   Lennette Bihari, MD  gabapentin (NEURONTIN) 300 MG capsule TAKE 1 CAPSULE BY MOUTH THREE TIMES A DAY 07/27/23   Thapa,  [place]ra[place], MD  Glucagon (GVOKE HYPOPEN 2-PACK) 1 MG/0.2ML SOAJ Inject to treat severe low blood sugars 06/30/22   Reather Littler, MD  insulin aspart (NOVOLOG) 100 UNIT/ML injection INJECT 15-30 UNITS UNDER THE SKIN THREE TIMES DAILY BEFORE MEALS. 07/21/23   Reather Littler, MD  insulin degludec (TRESIBA FLEXTOUCH) 100 UNIT/ML FlexTouch Pen INJECT 32 UNITS DAILY IN [facility] 03/14/23   Reather Littler, MD  latanoprost (XALATAN) 0.005 % ophthalmic solution Place 1 drop into both eyes at bedtime. 08/17/22   [provider]  meclizine (ANTIVERT) 25 MG tablet Take 25 mg by mouth 3 (three) times daily as needed (vertigo). 01/01/22   [provider]  metFORMIN (GLUCOPHAGE-XR) 500 MG 24 hr tablet TAKE 3 TABLETS BY MOUTH EVERY DAY WITH SUPPER 04/01/22   Reather Littler, MD  nitroGLYCERIN (NITROSTAT) 0.4 MG SL tablet PLACE 1 TABLET UNDER THE TONGUE EVERY 5 MINUTES AS NEEDED FOR CHEST PAIN 05/10/22   Lennette Bihari, MD  pantoprazole (PROTONIX) 20 MG tablet Take 1 tablet (20 mg total) by mouth daily for 14 days. 06/02/23 06/16/23  Mesner, Barbara Cower, MD  polyethylene glycol (MIRALAX / GLYCOLAX) 17 g packet Take 17 g by mouth 2 (two) times daily as needed for severe constipation. 06/02/23   Mesner, Barbara Cower, MD  promethazine (PHENERGAN) 25 MG tablet Take 1 tablet by mouth every 8 (eight) hours as needed for nausea. 06/25/19   [provider]  tiZANidine (ZANAFLEX) 2 MG tablet TAKE 1 TABLET BY MOUTH EVERY 6 HOURS AS NEEDED FOR 10 DAYS 09/27/22   [provider]  triamcinolone cream (KENALOG) 0.1 % Apply 1 application. topically 2 (two) times daily as needed (rash). 12/07/21   [provider]  Vibegron (GEMTESA PO) Take by mouth.    [provider]  XARELTO 20 MG TABS tablet TAKE 1 TABLET BY MOUTH DAILY WITH SUPPER 03/14/23   Lennette Bihari, MD  zinc gluconate 50 MG tablet Take 50 mg by mouth daily.    [provider]      Allergies    Black pepper [piper], Chocolate, Codeine,  Oxytetracycline, Statins, Latex, Other, and Tape    Review of Systems   Review of Systems  Constitutional:  Negative for chills and fever.  Respiratory:  Negative for shortness of breath.   Cardiovascular:  Negative for chest pain.  Gastrointestinal:  Negative for abdominal pain.  Neurological:  Positive for light-headedness. Negative for headaches.    Physical Exam Updated Vital Signs BP (!) 141/128   Pulse 76   Temp 97.6 F (36.4 C) (Oral)   Resp 16   Ht 5\' 9"  (1.753 m)   Wt 124.7 kg   SpO2 99%   BMI 40.61 kg/m  Physical Exam Vitals and nursing note reviewed.  Constitutional:  General: He is not in acute distress.    Appearance: He is not ill-appearing.  HENT:     Head: Normocephalic and atraumatic.     Nose: No congestion.  Eyes:     Conjunctiva/sclera: Conjunctivae normal.  Cardiovascular:     Rate and Rhythm: Normal rate and regular rhythm.     Pulses: Normal pulses.     Heart sounds: No murmur heard.    No friction rub. No gallop.  Pulmonary:     Effort: No respiratory distress.     Breath sounds: No wheezing, rhonchi or rales.  Musculoskeletal:     Right lower leg: No edema.     Left lower leg: No edema.     Comments: No unilateral leg swelling no calf tenderness no palpable cords.  Skin:    General: Skin is warm and dry.  Neurological:     Mental Status: He is alert.  Psychiatric:        Mood and Affect: Mood normal.     ED Results / Procedures / Treatments   Labs (all labs ordered are listed, but only abnormal results are displayed) Labs Reviewed  CBC WITH DIFFERENTIAL/PLATELET - Abnormal; Notable for the following components:      Result Value   Abs Immature Granulocytes 0.09 (*)    All other components within normal limits  COMPREHENSIVE METABOLIC PANEL - Abnormal; Notable for the following components:   CO2 20 (*)    Glucose, Bld 110 (*)    Calcium 8.8 (*)    All other components within normal limits  URINALYSIS, ROUTINE W REFLEX  MICROSCOPIC  CBG MONITORING, ED  TROPONIN I (HIGH SENSITIVITY)    EKG EKG Interpretation Date/Time:  Wednesday August 10 2023 13:03:16 EDT Ventricular Rate:  73 PR Interval:  218 QRS Duration:  124 QT Interval:  426 QTC Calculation: 469 R Axis:   130  Text Interpretation: AV dual-paced rhythm with prolonged AV conduction Abnormal ECG No significant change since last tracing Confirmed by Melene Plan [phone]) on 08/10/2023 9:57:32 PM  Radiology DG Chest 2 View  Result Date: 08/10/2023 CLINICAL DATA:  Dizziness, near syncope. EXAM: CHEST - 2 VIEW COMPARISON:  Apr 06, 2022. FINDINGS: Stable cardiomediastinal silhouette. Left-sided pacemaker is unchanged. Status post coronary bypass graft. Lungs are clear. Bony thorax is unremarkable. IMPRESSION: No active cardiopulmonary disease. Electronically Signed   By: Lupita Raider M.D.   On: 08/10/2023 14:43    Procedures Procedures  {Document cardiac monitor, telemetry assessment procedure when appropriate:1}  Medications Ordered in ED Medications - No data to display  ED Course/ Medical Decision Making/ A&P   {   Click here for ABCD2, HEART and other calculatorsREFRESH Note before signing :1}                              Medical Decision Making  This patient presents to the ED for concern of near syncope, this involves an extensive number of treatment options, and is a complaint that carries with it a high risk of complications and morbidity.  The differential diagnosis includes ACS, CHF, PE, arrhythmia, CVA, intracranial bleed, metabolic abnormality    Additional history obtained:  Additional history obtained from wife at bedside External records from outside source obtained and reviewed including recent cardiology notes   Co morbidities that complicate the patient evaluation   CHF, atrial fibrillation, on anticoag's,  Social Determinants of Health:  Geriatric    Lab Tests:  I Ordered, and personally interpreted labs.   The pertinent results include: CMP shows CO2 of 20, glucose 110, calcium 8.8, CBC is unremarkable,   Imaging Studies ordered:  I ordered imaging studies including chest x-ray I independently visualized and interpreted imaging which showed no acute findings I agree with the radiologist interpretation   Cardiac Monitoring:  The patient was maintained on a cardiac monitor.  I personally viewed and interpreted the cardiac monitored which showed an underlying rhythm of: EKG without signs of ischemia   Medicines ordered and prescription drug management:  I ordered medication including N/A I have reviewed the patients home medicines and have made adjustments as needed  Critical Interventions:  N/A   Reevaluation:  Presents with near syncope, triage obtain basic lab workup imaging which I personally reviewed, they are unremarkable, they also obtain orthostatics appears that patient did have a slight drop in his blood pressure from going laying down to standing, will interrogate pacemaker, add on troponin, continue to monitor.    Consultations Obtained:  I requested consultation with the ***,  and discussed lab and imaging findings as well as pertinent plan - they recommend: ***    Test Considered:  ***    Rule out ****    Dispostion and problem list  After consideration of the diagnostic results and the patients response to treatment, I feel that the patent would benefit from ***.       {Document critical care time when appropriate:1} {Document review of labs and clinical decision tools ie heart score, Chads2Vasc2 etc:1}  {Document your independent review of radiology images, and any outside records:1} {Document your discussion with family members, caretakers, and with consultants:1} {Document social determinants of health affecting pt's care:1} {Document your decision making why or why not admission, treatments were needed:1} Final Clinical Impression(s) / ED  Diagnoses Final diagnoses:  None    Rx / DC Orders ED Discharge Orders     None

## 2023-08-10 NOTE — ED Notes (Signed)
Pt ambulated to the bathroom with no issues Pulse ox read 97% on RA while ambulating.

## 2023-08-11 LAB — URINALYSIS, ROUTINE W REFLEX MICROSCOPIC
Bilirubin Urine: NEGATIVE
Glucose, UA: >=500 mg/dL — AB
Ketones, ur: NEGATIVE mg/dL
Leukocytes,Ua: NEGATIVE
Nitrite: NEGATIVE
Protein, ur: 30 mg/dL — AB
RBC / HPF: >50 RBC/hpf (ref 0–5)
Specific Gravity, Urine: 1.018 (ref 1.005–1.030)
pH: 5 (ref 5.0–8.0)

## 2023-08-11 LAB — TROPONIN I (HIGH SENSITIVITY): Troponin I (High Sensitivity): 15 ng/L

## 2023-08-11 NOTE — Discharge Instructions (Signed)
Lab workup and imaging were all reassuring.  I recommend staying hydrated as this can lead to episodes of feeling dizziness or lightheaded, I recommend waiting 3 seconds when changing positions for example going from sitting to standing wait 3 seconds before walking always have 3 points of contact  Please follow-up with cardiologist for further evaluation  Come back to the emergency department if you develop chest pain, shortness of breath, severe abdominal pain, uncontrolled nausea, vomiting, diarrhea.

## 2023-08-11 NOTE — ED Notes (Signed)
Interrogated pacemaker but no response for 1 hour and half.  Interrogated pacemaker again waiting for results.

## 2023-08-16 ENCOUNTER — Ambulatory Visit: Payer: Medicare Other

## 2023-08-16 DIAGNOSIS — I495 Sick sinus syndrome: Secondary | ICD-10-CM | POA: Diagnosis not present

## 2023-08-17 LAB — CUP PACEART REMOTE DEVICE CHECK
Battery Remaining Longevity: 46 mo
Battery Remaining Percentage: 48 %
Battery Voltage: 2.96 V
Brady Statistic AP VP Percent: 89 %
Brady Statistic AP VS Percent: 1 %
Brady Statistic AS VP Percent: 7.2 %
Brady Statistic AS VS Percent: 2.2 %
Brady Statistic RA Percent Paced: 83 %
Date Time Interrogation Session: 20240924020011
Implantable Lead Connection Status: 753985
Implantable Lead Connection Status: 753985
Implantable Lead Connection Status: 753985
Implantable Lead Implant Date: 20091007
Implantable Lead Implant Date: 20091007
Implantable Lead Implant Date: 20200922
Implantable Lead Location: 753858
Implantable Lead Location: 753859
Implantable Lead Location: 753860
Implantable Pulse Generator Implant Date: 20200922
Lead Channel Impedance Value: 1250 Ohm
Lead Channel Impedance Value: 460 Ohm
Lead Channel Impedance Value: 610 Ohm
Lead Channel Pacing Threshold Amplitude: 0.625 V
Lead Channel Pacing Threshold Amplitude: 0.75 V
Lead Channel Pacing Threshold Amplitude: 1.625 V
Lead Channel Pacing Threshold Pulse Width: 0.4 ms
Lead Channel Pacing Threshold Pulse Width: 0.4 ms
Lead Channel Pacing Threshold Pulse Width: 0.5 ms
Lead Channel Sensing Intrinsic Amplitude: 12 mV
Lead Channel Sensing Intrinsic Amplitude: 2.4 mV
Lead Channel Setting Pacing Amplitude: 1.75 V
Lead Channel Setting Pacing Amplitude: 2 V
Lead Channel Setting Pacing Amplitude: 2.625
Lead Channel Setting Pacing Pulse Width: 0.4 ms
Lead Channel Setting Pacing Pulse Width: 0.5 ms
Lead Channel Setting Sensing Sensitivity: 2 mV
Pulse Gen Model: 3562
Pulse Gen Serial Number: 9157656

## 2023-08-30 ENCOUNTER — Ambulatory Visit: Payer: Medicare Other | Attending: Cardiovascular Disease | Admitting: Cardiovascular Disease

## 2023-08-30 ENCOUNTER — Encounter: Payer: Self-pay | Admitting: Cardiovascular Disease

## 2023-08-30 DIAGNOSIS — Z951 Presence of aortocoronary bypass graft: Secondary | ICD-10-CM | POA: Diagnosis present

## 2023-08-30 DIAGNOSIS — G4733 Obstructive sleep apnea (adult) (pediatric): Secondary | ICD-10-CM | POA: Insufficient documentation

## 2023-08-30 DIAGNOSIS — I255 Ischemic cardiomyopathy: Secondary | ICD-10-CM | POA: Insufficient documentation

## 2023-08-30 DIAGNOSIS — E785 Hyperlipidemia, unspecified: Secondary | ICD-10-CM | POA: Insufficient documentation

## 2023-08-30 DIAGNOSIS — D6859 Other primary thrombophilia: Secondary | ICD-10-CM | POA: Insufficient documentation

## 2023-08-30 DIAGNOSIS — I119 Hypertensive heart disease without heart failure: Secondary | ICD-10-CM

## 2023-08-30 DIAGNOSIS — I951 Orthostatic hypotension: Secondary | ICD-10-CM | POA: Diagnosis present

## 2023-08-30 DIAGNOSIS — I480 Paroxysmal atrial fibrillation: Secondary | ICD-10-CM | POA: Insufficient documentation

## 2023-08-30 DIAGNOSIS — Z950 Presence of cardiac pacemaker: Secondary | ICD-10-CM | POA: Insufficient documentation

## 2023-08-30 MED ORDER — SACUBITRIL-VALSARTAN 24-26 MG PO TABS
1.0000 | ORAL_TABLET | Freq: Two times a day (BID) | ORAL | Status: DC
Start: 2023-08-30 — End: 2023-10-05

## 2023-08-30 NOTE — Progress Notes (Signed)
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   PHYSICAL EXAM:   VS:  BP 102/72   Pulse 77   Ht 5\' 9"  (1.753 m)   Wt 282 lb (127.9 kg)   SpO2 97%   BMI 41.64 kg/m     Repeat blood pressure by me was 112/70 supine and 84/64 sitting  Wt Readings from Last 3 Encounters:  08/30/23 282 lb (127.9 kg)  08/10/23 275 lb (124.7 kg)  06/13/23 275 lb (124.7 kg)   General: Alert, oriented, no distress.  Skin: normal turgor, no rashes, warm and dry HEENT: Normocephalic, atraumatic. Pupils equal round and reactive to light; sclera anicteric; extraocular muscles intact; Nose without nasal septal hypertrophy Mouth/Parynx benign; Mallinpatti scale 3 Neck: No JVD, no carotid bruits; normal carotid upstroke Lungs: clear to ausculatation and percussion; no wheezing or rales Chest wall: without tenderness to palpitation Heart: PMI not displaced, RRR, s1 s2 normal, 1/6 systolic murmur, no diastolic murmur, no rubs, gallops, thrills, or heaves Abdomen: soft, nontender; no hepatosplenomehaly, BS+; abdominal aorta nontender and not dilated by palpation. Back: no CVA tenderness Pulses 2+ Musculoskeletal: full range of motion, normal strength, no joint deformities Extremities: no clubbing cyanosis or edema, Homan's sign negative  Neurologic: grossly nonfocal; Cranial nerves grossly wnl Psychologic: Normal mood and  affect    Studies/Labs Reviewed:   EKG Interpretation Date/Time:  Tuesday August 30 2023 13:37:29 EDT Ventricular Rate:  77 PR Interval:  194 QRS Duration:  148 QT Interval:  428 QTC Calculation: 484 R Axis:   152  Text Interpretation: AV dual-paced rhythm When compared with ECG of 10-Aug-2023 13:03, Vent. rate has increased BY   4 BPM Confirmed by Charles [name] ([id number]) on 08/30/2023 2:24:50 PM    November 08, 2022 ECG (independently read by me): AV paced rhythm at 75, PR interval 216 ms.  January 12, 2022 ECG (independently read by me): Atrial fibrillation at 97, LBBB  Apr 06, 2021 ECG (independently read by  me): AV paced at 70; PR 214 msec    November 2020 ECG (independently read by me): AV paced rhythm at 70 bpm with prolonged AV conduction.  PR interval 224 ms.  January 04, 2019 ECG (independently read by me): Atrially paced rhythm at 65 bpm with prolonged AV conduction with a peroneal a 254 ms.  Left bundle branch block.  June 26, 2018 ECG (independently read by me):  Atrial paced at 71, prolonged AV conduction PR 250 msec; [facility]  June 07, 2018 ECG (independently read by me): Atrially paced rhythm at 70 bpm with prolonged AV conduction with PR interval at 238.  Left bundle branch block.  May 2019 ECG (independently read by me): Atrially paced rhythm at 76 bpm with prolonged AV conduction with a PR interval 236 ms.  Left bundle branch block.  January 2019 ECG (independently read by me): Atrial paced rhythm at 70 bpm.  Prolonged AV conduction with a PR interval 248.  Left bundle branch block.  QTc interval 494 ms.  April 2018 ECG (independently read by me): Atrially paced rhythm at 74 bpm, prolonged A-V conduction with a PR 246.    ECG (independently read by me): Atrial paced rhythm at 76 bpm alternating with atrial sensing in a bigeminal pattern.  There is left bundle branch block.  Recent Labs:    Latest Ref Rng & Units 08/10/2023    1:25 PM 06/01/2023    8:39 PM  06/01/2023    8:21 AM  BMP  Glucose 70 - 99 mg/dL 440  347  425   BUN 8 - 23 mg/dL 19  35  31   Creatinine 0.61 - 1.24 mg/dL 9.56  3.87  5.64   BUN/Creat Ratio 10 - 24   25   Sodium 135 - 145 mmol/L 137  136  137   Potassium 3.5 - 5.1 mmol/L 3.9  4.6  4.3   Chloride 98 - 111 mmol/L 108  106  104   CO2 22 - 32 mmol/L 20  20  17    Calcium 8.9 - 10.3 mg/dL 8.8  9.0  8.9         Latest Ref Rng & Units 08/10/2023    1:25 PM 06/01/2023    8:39 PM 06/01/2023    8:21 AM  Hepatic Function  Total Protein 6.5 - 8.1 g/dL 6.9  7.5  6.9   Albumin 3.5 - 5.0 g/dL 3.5  3.9  4.2   AST 15 - 41 U/L 21  22  19    ALT 0 - 44 U/L 24  35  34   Alk Phosphatase 38 - 126 U/L 57  78  89   Total Bilirubin 0.3 - 1.2 mg/dL 0.7  0.7  0.7        Latest Ref Rng & Units 08/10/2023    1:25 PM 06/01/2023    8:39 PM 04/06/2022    2:00 PM  CBC  WBC 4.0 - 10.5 K/uL 8.2  10.1  6.7   Hemoglobin 13.0 - 17.0 g/dL 33.2  95.1  88.4   Hematocrit 39.0 - 52.0 % 47.6  47.4  44.8   Platelets 150 - 400 K/uL 199  179  187    Lab Results  Component Value Date   MCV 94.8 08/10/2023   MCV 93.3 06/01/2023   MCV 92.9 04/06/2022   Lab Results  Component Value Date   TSH 1.930 03/12/2022   Lab Results  Component Value Date   HGBA1C 7.2 (H) 06/01/2023  BNP    Component Value Date/Time   BNP 361.5 (H) 04/06/2022 1400   BNP 77.0 07/03/2013 1604    ProBNP    Component Value Date/Time   PROBNP 739 (H) 08/07/2018 0912   PROBNP 364.0 (H) 10/21/2015 0932     Lipid Panel     Component Value Date/Time   CHOL 170 07/21/2022 1017   TRIG 124.0 07/21/2022 1017   HDL 44.20 07/21/2022 1017   CHOLHDL 4 07/21/2022 1017   VLDL 24.8 07/21/2022 1017   LDLCALC 101 (H) 07/21/2022 1017   LDLDIRECT 76 01/31/2023 0811     RADIOLOGY: CUP PACEART REMOTE DEVICE CHECK  Result Date: 08/17/2023 Scheduled remote reviewed. Normal device function.  Next remote 91 days. LA, CVRS  DG Chest 2 View  Result Date:  08/10/2023 CLINICAL DATA:  Dizziness, near syncope. EXAM: CHEST - 2 VIEW COMPARISON:  Apr 06, 2022. FINDINGS: Stable cardiomediastinal silhouette. Left-sided pacemaker is unchanged. Status post coronary bypass graft. Lungs are clear. Bony thorax is unremarkable. IMPRESSION: No active cardiopulmonary disease. Electronically Signed   By: Lupita Raider M.D.   On: 08/10/2023 14:43     Additional studies/ records that were reviewed today include:  I reviewed the patient's hospitalizations, office visits evaluations, notes from Dr. Tyrone [name], laboratory, and  catheterization data, I reviewed his echo Doppler study as well as his recent evaluation from [facility]. Recent hospitalization for ICD implant. A new download was obtained of his CPAP unit from October 17 through October 07, 2019.  EP records were reviewed.  Download was obtained from April 14 through Apr 03, 2021 which shows excellent compliance with average use at 9 hours and 16 minutes.  There is significant mask leak.  AutoSet pressure range was from 16 to 20 cm.  AHI 13.2.  A new download was obtained from January 22 through January 11, 2022 as noted above  A download was obtained from March 14 through March 04, 2023.   Mar 30, 2023 CLINICAL INFORMATION The patient is referred for a BiPAP titration to treat sleep apnea.   On CPAP therapy with last machine set up July 12, 2018: currently at maximal pressure with continued events.   SLEEP STUDY TECHNIQUE As per the AASM Manual for the Scoring of Sleep and Associated Events v2.3 (April 2016) with a hypopnea requiring 4% desaturations.   The channels recorded and monitored were frontal, central and occipital EEG, electrooculogram (EOG), submentalis EMG (chin), nasal and oral airflow, thoracic and abdominal wall motion, anterior tibialis EMG, snore microphone, electrocardiogram, and pulse oximetry. Bilevel positive airway pressure (BPAP) was initiated at the beginning of the  study and titrated to treat sleep-disordered breathing.   MEDICATIONS acetaminophen (TYLENOL) 650 MG CR tablet alfuzosin (UROXATRAL) 10 MG 24 hr tablet aspirin EC 81 MG EC tablet BD INSULIN SYRINGE U/F 30G X 1/2" 0.5 ML MISC BD ULTRA-FINE PEN NEEDLES 29G X 12.7MM MISC carvedilol (COREG) 12.5 MG tablet cephALEXin (KEFLEX) 500 MG capsule COLLAGEN PO Continuous Blood Gluc Receiver (FREESTYLE LIBRE 14 DAY READER) DEVI Continuous Blood Gluc Sensor (FREESTYLE LIBRE 14 DAY SENSOR) MISC cyanocobalamin 1000 MCG tablet ENTRESTO 49-51 MG ezetimibe (ZETIA) 10 MG tablet FARXIGA 5 MG TABS tablet fluticasone (FLONASE) 50 MCG/ACT nasal spray furosemide (LASIX) 40 MG tablet gabapentin (NEURONTIN) 300 MG capsule Glucagon (GVOKE HYPOPEN 2-PACK) 1 MG/0.2ML SOAJ insulin degludec (TRESIBA FLEXTOUCH) 100 UNIT/ML FlexTouch Pen latanoprost (XALATAN) 0.005 % ophthalmic solution meclizine (ANTIVERT) 25 MG tablet metFORMIN (GLUCOPHAGE-XR) 500 MG 24 hr tablet nitroGLYCERIN (NITROSTAT) 0.4 MG SL  tablet NOVOLOG 100 UNIT/ML injection promethazine (PHENERGAN) 25 MG tablet tiZANidine (ZANAFLEX) 2 MG tablet triamcinolone cream (KENALOG) 0.1 % Vibegron (GEMTESA PO) XARELTO 20 MG TABS tablet zinc gluconate 50 MG tablet Medications self-administered by patient taken the night of the study : LATANOPROST   RESPIRATORY PARAMETERS Optimal IPAP Pressure (cm):20AHI at Optimal Pressure (/hr)0.5 Optimal EPAP Pressure (cm):            16           Overall Minimal O2 (%):84.0Minimal O2 at Optimal Pressure (%):91.0   SLEEP ARCHITECTURE Start Time:10:21:09 PMStop Time:4:29:56 AMTotal Time (min):368.8Total Sleep Time (min):357 Sleep Latency (min):2.8Sleep Efficiency (%):96.8%REM Latency (min):178.5WASO (min):9.0 Stage N1 (%):0.7%Stage N2 (%):95.4%Stage N3 (%):0.0%Stage R (%):3.9 Supine (%):100.00Arousal Index (/hr):5.5          CARDIAC DATA The 2 lead EKG demonstrated pacemaker generated. The mean heart rate was 73.0  beats per minute. Other EKG findings include: PVCs.   LEG MOVEMENT DATA The total Periodic Limb Movements of Sleep (PLMS) were 0. The PLMS index was 0.0. A PLMS index of <15 is considered normal in adults.   IMPRESSIONS - BiPAP was initiated at 8/4and was titrated to 20/16 cm (AHI 0.5/h, O2 nadir 92% with absent REM sleep at 20/16. - Central sleep apnea was not noted during this titration (CAI 1.2/h). - Moderete oxygen desaturations were observed during this titration to a nadir of 84% at 11/7. - The patient snored with moderate snoring volume. - 2-lead EKG demonstrated: PVCs, paced rhythm - Clinically significant periodic limb movements were not noted during this study. Arousals associated with PLMs were rare.   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  A large size Resmed Full Face Quattro FX mask was used for the titration. - Effort should be made to optimize nasal and oropharyngeal patency. - Consider an oral guard for Bruxism. - Avoid alcohol, sedatives and other CNS depressants that may worsen sleep apnea and disrupt normal sleep architecture. - Sleep hygiene should be reviewed to assess factors that may improve sleep quality. - Weight management (BMI 41) and regular exercise should be initiated or continued. - Recommend a download and sleep clinic evaluation after 4 - 6 weeks of therapy    ASSESSMENT:    1. Orthostatic hypotension   2. S/P CABG x 4   3. Ischemic cardiomyopathy   4. PAF (paroxysmal atrial fibrillation) (HCC)   5. Cardiac pacemaker in situ   6. OSA on BiPAP   7. Morbid obesity (HCC)   8. Hyperlipidemia LDL goal <55   9. Hypercoagulable state [facility])    PLAN:  Mr. Mischke is a 77 year-old gentleman who had previously undergone multiple coronary interventions since 1995 for  significant coronary obstructive disease.  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 Tyrone [name] on 09/15/2016.  He has a history of PAF and was maintaining sinus rhythm with atrial pacing.  He is on Xarelto for anticoagulation.   His pacemaker was followed by Charles [name] and in September 2020 had implantation/upgrade of a dual chamber PPM to CRT-P on 08/14/2019 by Dr Johney [name]. and received a Physicist, medical model [id number] (serial number [id number]) lead, [employer] [place] MP model [id number] (serial  Number [id number]) biventricular pacemaker.  His echo on August 13, 2019  revealed an EF of 35 to 40% with mild LVH grade 1 diastolic dysfunction, and moderate mitral regurgitation.  He was on Entresto 24/26 mg twice a day, Farxiga 2.5 mg twice a day, carvedilol 6.25 mg twice a day, and as needed furosemide.  His echo study from May 07, 2021 demonstrated normalization of LV function with EF at 55 to 60%.  There were no wall motion abnormalities.  There was grade 1 diastolic dysfunction.  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  He was last evaluated in A-fib clinic in September 2023 and was maintaining sinus rhythm and he was maintained on carvedilol 12.5 mg twice a day and Xarelto 20 mg daily.  When seen by me in February 2023 he was in atrial fibrillation.  At that time I recommended he titrate carvedilol from 6.25 mg twice a day to 12.5 mg twice a day.  Follow-up EP evaluation was recommended.  At his follow-up evaluation in April 2024, blood pressure was stable on his regimen of carvedilol 12.5 mg twice a day, Entresto 49/51 mg twice a day, Farxiga 5 mg, furosemide 40 mg daily.  He is diabetic on Farxiga in addition to NovoLog and [employer].  He is statin intolerant and has been on Zetia 10 mg for hyperlipidemia.  Laboratory on January 31, 2023 showed that his LDL cholesterol had improved from 108  down to 76.  Ultimate target is less than 55.  If unable to reach, he may be a candidate for trial of bempedoic acid or possible Repatha.  Due to his continued AHI elevation, he ultimately underwent his BiPAP titration evaluation.  He received a new ResMed air curve 11 BiPAP unit on June 29, 2023.  Initial download showed he was meeting compliance standards and requiring high pressure his 95th percentile pressure at 23.4/19.4 with AHI 3.0.  On his most recent download from September 3 to August 24, 2023 he has had declined.  Sleep duration was only 5 hours and 31 minutes per night.  He had significant mask leak and AHI had increased to 6.2.  An Epworth Sleepiness Scale score calculated in the office today at 14 consistent with excessive daytime sleepiness undoubtedly contributed by his reduced sleep duration.  He continues to be paced on EKG with heart rate at 77.  His blood pressure today is low and he has significant orthostatic drop.  As result, I am recommending he decrease his Sherryll Burger which he had been taking 49/51 mg twice a day down to 24/26 mg twice a day.  I have recommended he take furosemide 20 mg as needed rather than previously 40 mg.  He continues to be on carvedilol 12.5 mg twice a day, Farxiga 5 mg.  He is on Zetia 10 mg for hyperlipidemia.  He continues to to be on Xarelto for anticoagulation.  I am recommending he undergo a follow-up echo Doppler study for reassessment of LV function which was last determined in March 2023 with EF at 45 to 50%.  He is on [place] in addition to NovoLog for his diabetes mellitus.  He will be establishing with a new primary care provider and will be seeing Dr. Burnell Blanks at [facility] family [facility].  I am recommending he undergo a follow-up echo Doppler study.  I will see him in 2 to 3 months for cardiology and sleep reevaluation.    Medication Adjustments/Labs and Tests Ordered: Current medicines are reviewed at length  with the patient today.  Concerns  regarding medicines are outlined above.  Medication changes, Labs and Tests ordered today are listed in the Patient Instructions below. Patient Instructions  Medication Instructions:  Cut the Entresto 49-51 in half taking one half in the morning and one half in the evening until completed.  A new prescription has been sent to your pharmacy for [facility] 24-26. Take one tablet twice a day.  *If you need a refill on your cardiac medications before your next appointment, please call your pharmacy*   Lab Work: No labs ordered on today's visit  If you have labs (blood work) drawn today and your tests are completely normal, you will receive your results only by: MyChart Message (if you have MyChart) OR A paper copy in the mail If you have any lab test that is abnormal or we need to change your treatment, we will call you to review the results.   Testing/Procedures: Your physician has requested that you have an echocardiogram. Echocardiography is a painless test that uses sound waves to create images of your heart. It provides your doctor with information about the size and shape of your heart and how well your heart's chambers and valves are working. This procedure takes approximately one hour. There are no restrictions for this procedure. Please do NOT wear cologne, perfume, aftershave, or lotions (deodorant is allowed).  Please arrive 15 minutes prior to your appointment time.    Referral for Heart Care Pharmacy Clinic (post Echo)  Follow-Up: At [facility], you and your health needs are our priority.  As part of our continuing mission to provide you with exceptional heart care, we have created designated Provider Care Teams.  These Care Teams include your primary Cardiologist (physician) and Advanced Practice Providers (APPs -  Physician Assistants and Nurse Practitioners) who all work together to provide you with the care you need, when you need it.  We recommend signing up for  the patient portal called "MyChart".  Sign up information is provided on this After Visit Summary.  MyChart is used to connect with patients for Virtual Visits (Telemedicine).  Patients are able to view lab/test results, encounter notes, upcoming appointments, etc.  Non-urgent messages can be sent to your provider as well.   To learn more about what you can do with MyChart, go to [email or web address].    Your next appointment:   2 month(s)  Provider:   Azalee Course, PA-C    Then, Charles Guadalajara, MD will plan to see you again based on evaluation from PA     Signed, Charles Guadalajara, MD  09/02/2023 8:19 AM    [facility] [address], [place], [place]  [address] Phone: [phone]

## 2023-08-30 NOTE — Patient Instructions (Addendum)
Medication Instructions:  Cut the Entresto 49-51 in half taking one half in the morning and one half in the evening until completed.  A new prescription has been sent to your pharmacy for [facility] 24-26. Take one tablet twice a day.  *If you need a refill on your cardiac medications before your next appointment, please call your pharmacy*   Lab Work: No labs ordered on today's visit  If you have labs (blood work) drawn today and your tests are completely normal, you will receive your results only by: MyChart Message (if you have MyChart) OR A paper copy in the mail If you have any lab test that is abnormal or we need to change your treatment, we will call you to review the results.   Testing/Procedures: Your physician has requested that you have an echocardiogram. Echocardiography is a painless test that uses sound waves to create images of your heart. It provides your doctor with information about the size and shape of your heart and how well your heart's chambers and valves are working. This procedure takes approximately one hour. There are no restrictions for this procedure. Please do NOT wear cologne, perfume, aftershave, or lotions (deodorant is allowed).  Please arrive 15 minutes prior to your appointment time.    Referral for Heart Care Pharmacy Clinic (post Echo)  Follow-Up: At [facility], you and your health needs are our priority.  As part of our continuing mission to provide you with exceptional heart care, we have created designated Provider Care Teams.  These Care Teams include your primary Cardiologist (physician) and Advanced Practice Providers (APPs -  Physician Assistants and Nurse Practitioners) who all work together to provide you with the care you need, when you need it.  We recommend signing up for the patient portal called "MyChart".  Sign up information is provided on this After Visit Summary.  MyChart is used to connect with patients for Virtual Visits  (Telemedicine).  Patients are able to view lab/test results, encounter notes, upcoming appointments, etc.  Non-urgent messages can be sent to your provider as well.   To learn more about what you can do with MyChart, go to [email or web address].    Your next appointment:   2 month(s)  Provider:   Azalee Course, PA-C    Then, Nicki Guadalajara, MD will plan to see you again based on evaluation from PA

## 2023-09-01 NOTE — Progress Notes (Signed)
Remote pacemaker transmission.   

## 2023-09-02 ENCOUNTER — Encounter: Payer: Self-pay | Admitting: Cardiovascular Disease

## 2023-09-05 ENCOUNTER — Ambulatory Visit: Payer: Medicare Other | Attending: Cardiovascular Disease

## 2023-09-05 DIAGNOSIS — Z950 Presence of cardiac pacemaker: Secondary | ICD-10-CM | POA: Diagnosis not present

## 2023-09-05 DIAGNOSIS — I5042 Chronic combined systolic (congestive) and diastolic (congestive) heart failure: Secondary | ICD-10-CM | POA: Diagnosis not present

## 2023-09-07 ENCOUNTER — Telehealth: Payer: Self-pay

## 2023-09-07 NOTE — Progress Notes (Signed)
EPIC Encounter for ICM Monitoring  Patient Name: Charles [name] is a 77 y.o. male Date: 09/07/2023 Primary Care Physican: [name], PA-C Primary Cardiologist: Tresa Endo Electrophysiologist: Mealor Bi-V Pacing: 93%  03/30/2023 Office weight: 280 lbs 05/24/2023 Weight:  271 lbs   AT/AF Burden: 4.1% (taking Xarelto)                                                          Attempted call to patient and unable to reach.  Left detailed message per DPR regarding transmission. Transmission reviewed.    CorVue thoracic impedance suggesting normal fluid levels with the exception of possible fluid accumulation from 9/21-9/27.   Prescribed: Furosemide 40 mg take 1 tablet (40 mg total) by mouth daily as needed Farxiga 50 mg take 1 tablet daily Xarelto 20 mg take 1 tablet at supper daily   Labs: 01/31/2023 Creatinine 1.14, BUN 19, Potassium 5.1, Sodium 143, GFR 67 A complete set of results can be found in Results Review.   Recommendations:  Left voice mail with ICM number and encouraged to call if experiencing any fluid symptoms.   Follow-up plan: ICM clinic phone appointment on 10/10/2023.   91 day device clinic remote transmission 11/15/2023.   EP/Cardiology Office Visits:   11/01/2023 with Azalee Course, PA.   Recall 12/01/2023 with Otilio Saber, PA   Copy of ICM check sent to Dr. Nelly Laurence.   3 month ICM trend: 09/05/2023.    12-14 Month ICM trend:     [name], RN 09/07/2023 4:17 PM

## 2023-09-07 NOTE — Telephone Encounter (Signed)
Remote ICM transmission received.  Attempted call to patient regarding ICM remote transmission and left detailed message per DPR.  Left ICM phone number and advised to return call for any fluid symptoms or questions. Next ICM remote transmission scheduled 10/10/2023.

## 2023-09-13 NOTE — Telephone Encounter (Signed)
Reached out to patient and he states he got a new mask and his bipap is working fine now.

## 2023-09-14 ENCOUNTER — Telehealth: Payer: Self-pay

## 2023-09-14 NOTE — Telephone Encounter (Signed)
Transition Care Management Unsuccessful Follow-up Telephone Call  Date of discharge and from where:  [name] 9/[facility]  Attempts:  1st Attempt  Reason for unsuccessful TCM follow-up call:  No answer/busy   Lenard Forth [place]  [employer], [facility] Guide, Phone: [phone] Website: [email or web address]

## 2023-09-15 ENCOUNTER — Telehealth: Payer: Self-pay

## 2023-09-15 NOTE — Telephone Encounter (Signed)
Transition Care Management Unsuccessful Follow-up Telephone Call  Date of discharge and from where:  [name]  Attempts:  2nd Attempt  Reason for unsuccessful TCM follow-up call:  No answer/busy   [name]  [facility], [facility] [address] Guide, Phone: [phone] Website: [email or web address]

## 2023-09-24 ENCOUNTER — Emergency Department
Admission: EM | Admit: 2023-09-24 | Discharge: 2023-09-25 | Disposition: A | Discharge status: Home / Self Care | Payer: Medicare Other | Attending: Emergency Medicine | Admitting: Emergency Medicine

## 2023-09-24 DIAGNOSIS — N390 Urinary tract infection, site not specified: Secondary | ICD-10-CM | POA: Diagnosis not present

## 2023-09-24 DIAGNOSIS — Z951 Presence of aortocoronary bypass graft: Secondary | ICD-10-CM | POA: Insufficient documentation

## 2023-09-24 DIAGNOSIS — E162 Hypoglycemia, unspecified: Secondary | ICD-10-CM

## 2023-09-24 DIAGNOSIS — I509 Heart failure, unspecified: Secondary | ICD-10-CM | POA: Insufficient documentation

## 2023-09-24 DIAGNOSIS — N183 Chronic kidney disease, stage 3 unspecified: Secondary | ICD-10-CM | POA: Diagnosis not present

## 2023-09-24 DIAGNOSIS — E11649 Type 2 diabetes mellitus with hypoglycemia without coma: Secondary | ICD-10-CM | POA: Diagnosis not present

## 2023-09-24 DIAGNOSIS — Z7984 Long term (current) use of oral hypoglycemic drugs: Secondary | ICD-10-CM | POA: Diagnosis not present

## 2023-09-24 DIAGNOSIS — Z7982 Long term (current) use of aspirin: Secondary | ICD-10-CM | POA: Diagnosis not present

## 2023-09-24 DIAGNOSIS — I480 Paroxysmal atrial fibrillation: Secondary | ICD-10-CM | POA: Diagnosis not present

## 2023-09-24 DIAGNOSIS — I130 Hypertensive heart and chronic kidney disease with heart failure and stage 1 through stage 4 chronic kidney disease, or unspecified chronic kidney disease: Secondary | ICD-10-CM | POA: Diagnosis not present

## 2023-09-24 DIAGNOSIS — Z950 Presence of cardiac pacemaker: Secondary | ICD-10-CM | POA: Insufficient documentation

## 2023-09-24 DIAGNOSIS — Z794 Long term (current) use of insulin: Secondary | ICD-10-CM | POA: Insufficient documentation

## 2023-09-24 DIAGNOSIS — J449 Chronic obstructive pulmonary disease, unspecified: Secondary | ICD-10-CM | POA: Insufficient documentation

## 2023-09-24 DIAGNOSIS — J45909 Unspecified asthma, uncomplicated: Secondary | ICD-10-CM | POA: Diagnosis not present

## 2023-09-24 LAB — CBC WITH DIFFERENTIAL/PLATELET
Abs Immature Granulocytes: 0.02 10*3/uL (ref 0.00–0.07)
Basophils Absolute: 0 10*3/uL (ref 0.0–0.1)
Basophils Relative: 0 %
Eosinophils Absolute: 0.2 10*3/uL (ref 0.0–0.5)
Eosinophils Relative: 3 %
HCT: 46.7 % (ref 39.0–52.0)
Hemoglobin: 15.1 g/dL (ref 13.0–17.0)
Immature Granulocytes: 0 %
Lymphocytes Relative: 16 %
Lymphs Abs: 1.2 10*3/uL (ref 0.7–4.0)
MCH: 30.9 pg (ref 26.0–34.0)
MCHC: 32.3 g/dL (ref 30.0–36.0)
MCV: 95.5 fL (ref 80.0–100.0)
Monocytes Absolute: 0.6 10*3/uL (ref 0.1–1.0)
Monocytes Relative: 8 %
Neutro Abs: 5.3 10*3/uL (ref 1.7–7.7)
Neutrophils Relative %: 73 %
Platelets: 196 10*3/uL (ref 150–400)
RBC: 4.89 MIL/uL (ref 4.22–5.81)
RDW: 13.5 % (ref 11.5–15.5)
WBC: 7.2 10*3/uL (ref 4.0–10.5)
nRBC: 0 % (ref 0.0–0.2)

## 2023-09-24 LAB — URINALYSIS, ROUTINE W REFLEX MICROSCOPIC
Bilirubin Urine: NEGATIVE
Glucose, UA: >=500 mg/dL — AB
Hgb urine dipstick: NEGATIVE
Ketones, ur: NEGATIVE mg/dL
Leukocytes,Ua: NEGATIVE
Nitrite: NEGATIVE
Protein, ur: 30 mg/dL — AB
Specific Gravity, Urine: 1.02 (ref 1.005–1.030)
Squamous Epithelial / HPF: 0 /[HPF] (ref 0–5)
pH: 5 (ref 5.0–8.0)

## 2023-09-24 LAB — CBG MONITORING, ED
Glucose-Capillary: 104 mg/dL — ABNORMAL HIGH (ref 70–99)
Glucose-Capillary: 181 mg/dL — ABNORMAL HIGH (ref 70–99)
Glucose-Capillary: 216 mg/dL — ABNORMAL HIGH (ref 70–99)

## 2023-09-24 NOTE — ED Notes (Signed)
Pt given 4 oz of orange juice and crackers

## 2023-09-24 NOTE — ED Triage Notes (Signed)
Wife called EMS for pt who had altered mental status and slurred words. CBG was 56. Had about 200 mL of D10 and sugar increased to 124. Pt now A&Ox4 with no slur. CBG here is 104. Hx of DM, pace maker, and CABG. Pt received 4 mg of zofran. EMS vitals:  BP 163/88 97% RA HR 83
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
## 2023-09-25 DIAGNOSIS — E11649 Type 2 diabetes mellitus with hypoglycemia without coma: Secondary | ICD-10-CM | POA: Diagnosis not present

## 2023-09-25 LAB — COMPREHENSIVE METABOLIC PANEL
ALT: 23 U/L (ref 0–44)
AST: 17 U/L (ref 15–41)
Albumin: 3.8 g/dL (ref 3.5–5.0)
Alkaline Phosphatase: 61 U/L (ref 38–126)
Anion gap: 7 (ref 5–15)
BUN: 20 mg/dL (ref 8–23)
CO2: 23 mmol/L (ref 22–32)
Calcium: 8.6 mg/dL — ABNORMAL LOW (ref 8.9–10.3)
Chloride: 107 mmol/L (ref 98–111)
Creatinine, Ser: 1.06 mg/dL (ref 0.61–1.24)
GFR, Estimated: >60 mL/min (ref >60)
Glucose, Bld: 187 mg/dL — ABNORMAL HIGH (ref 70–99)
Potassium: 3.9 mmol/L (ref 3.5–5.1)
Sodium: 137 mmol/L (ref 135–145)
Total Bilirubin: 0.8 mg/dL (ref 0.3–1.2)
Total Protein: 7.3 g/dL (ref 6.5–8.1)

## 2023-09-25 MED ORDER — CEPHALEXIN 500 MG PO CAPS
500.0000 mg | ORAL_CAPSULE | Freq: Once | ORAL | Status: AC
  Administered 2023-09-25: 500 mg via ORAL
  Filled 2023-09-25: qty 1

## 2023-09-25 MED ORDER — CEPHALEXIN 500 MG PO CAPS: 500.0000 mg | ORAL_CAPSULE | Freq: Two times a day (BID) | ORAL | 0 refills | Status: DC

## 2023-09-27 LAB — URINE CULTURE: Culture: >=100000 — AB

## 2023-09-28 NOTE — Progress Notes (Signed)
ED Antimicrobial Stewardship Positive Culture Follow Up   Charles [name] is an 77 y.o. male who presented to [facility] on 09/24/2023 with a chief complaint of altered mental status and slurred words. CBG was 56.  Chief Complaint  Patient presents with   Hypoglycemia    Recent Results (from the past 720 hour(s))  Urine Culture     Status: Abnormal   Collection Time: 09/24/23 11:44 PM   Specimen: Urine, Clean Catch  Result Value Ref Range Status   Specimen Description   Final    URINE, CLEAN CATCH Performed at [facility], [address] [place], [place] [address]    Special Requests   Final    NONE Performed at [facility], [address] [place], [place] [address]    Culture >=100,000 COLONIES/mL [facility] MORGANII (A)  Final   Report Status 09/27/2023 FINAL  Final   Organism ID, Bacteria MORGANELLA MORGANII (A)  Final      Susceptibility   Morganella morganii - MIC*    AMPICILLIN >=32 RESISTANT Resistant     CIPROFLOXACIN <=0.25 SENSITIVE Sensitive     GENTAMICIN <=1 SENSITIVE Sensitive     IMIPENEM 8 INTERMEDIATE Intermediate     NITROFURANTOIN RESISTANT Resistant     TRIMETH/SULFA <=20 SENSITIVE Sensitive     AMPICILLIN/SULBACTAM >=32 RESISTANT Resistant     PIP/TAZO <=4 SENSITIVE Sensitive ug/mL    * >=100,000 COLONIES/mL MORGANELLA MORGANII    [x]  Treated with Cephalexin, organism resistant to prescribed antimicrobial []  Patient discharged originally without antimicrobial agent and treatment is now indicated  New antibiotic prescription:  No treatment warranted at this time. Patient presented with hypoglycemia (56) that resolved with 200 mL of D10. Didn't present with dysuria.  ED Provider: Dr. Bluford Kaufmann, PharmD Pharmacy Resident  09/28/2023 5:02 PM

## 2023-09-29 ENCOUNTER — Ambulatory Visit (HOSPITAL_COMMUNITY): Payer: Medicare Other

## 2023-10-03 ENCOUNTER — Telehealth: Payer: Self-pay | Admitting: Cardiovascular Disease

## 2023-10-03 ENCOUNTER — Other Ambulatory Visit: Payer: Self-pay | Admitting: Cardiovascular Disease

## 2023-10-03 DIAGNOSIS — I480 Paroxysmal atrial fibrillation: Secondary | ICD-10-CM

## 2023-10-03 NOTE — Telephone Encounter (Signed)
Pt c/o medication issue:  1. Name of Medication:   sacubitril-valsartan (ENTRESTO) 24-26 mg per tablet   2. How are you currently taking this medication (dosage and times per day)?   3. Are you having a reaction (difficulty breathing--STAT)?   4. What is your medication issue?   Patient stated he wants a call back to clarify his dosage and use of this medication.

## 2023-10-03 NOTE — Telephone Encounter (Signed)
Patient identification verified by 2 forms. Marilynn Rail, RN   Called and spoke to patient  Patient states:   -unsure which new medication he is supposed to taking for Dr. Tresa Endo   -unsure if there are any changes to his medications   -aspirin 81mg  daily   -Carvedilol 12.5mg  once daily, twice daily   -Entresto  1 tablet twice a day   -Xarelro 1 tablet daily   -Lasix 40mg  as needed  Informed patient per 03/07/23 appointment, no medication changes made  Patient requests message sent to Dr. Tresa Endo  Patient has no further questions at this time

## 2023-10-03 NOTE — Telephone Encounter (Signed)
Xarelto 20mg  refill request received. Pt is 77 years old, weight- 127.9kg, Crea-1.06 on 09/24/23, last seen by Dr. Tresa Endo on 03/07/23, Diagnosis-Afib, CrCl-107.25 mL/min; Dose is appropriate based on dosing criteria. Will send in refill to requested pharmacy.

## 2023-10-04 LAB — LAB REPORT - SCANNED
A1c: 6.7
EGFR: 71

## 2023-10-05 MED ORDER — ENTRESTO 24-26 MG PO TABS: 1.0000 | ORAL_TABLET | Freq: Two times a day (BID) | ORAL | 11 refills | Status: DC

## 2023-10-05 NOTE — Telephone Encounter (Signed)
Patient identification verified by 2 forms. Marilynn Rail, RN    Called and spoke to patients wife Ladona Horns states:   -patient seen 10/8  -medication change made by provider  -at end of visit was not given paperwork   -patient continues to have head fog, nausea, no improvement in symptoms   -unsure if patient needs to present to 11/14 pharmacy appointment since he has taken medication wrong  Informed linda:   -10/8 visit plan was to take 1/2 of entresto 49-51 twice a day until he can get new Rx for entresto 24-26  -New Rx sent today to pharmacy for [facility] 24-26, 1 tablet twice a day   -keep 12/10 appointment for follow up with PA Lisabeth Devoid    -RN will confirm with pharmacy regarding 11/14 appointment  Bonita Quin agrees with plan, no questions at this time

## 2023-10-05 NOTE — Telephone Encounter (Signed)
Patient is calling to follow up. Please advise.

## 2023-10-05 NOTE — Telephone Encounter (Signed)
Patient identification verified by 2 forms. Marilynn Rail, RN    Received call from patients wife Annabell Sabal linda: -to keep 11/14 appointment with pharmacy and bring in medications from home   -to contact pharmacy to know when new entresto 24-46 Rx will be ready for pick up   -once she picked up new entresto discard old prescription for entresto 49-51 Bonita Quin verbalized understanding, no questions at this time

## 2023-10-05 NOTE — Telephone Encounter (Signed)
 Attempted to call patient, no answer left message requesting a call back.

## 2023-10-06 ENCOUNTER — Ambulatory Visit: Payer: Medicare Other | Attending: Cardiology | Admitting: Pharmacist

## 2023-10-06 ENCOUNTER — Encounter: Payer: Self-pay | Admitting: Pharmacist

## 2023-10-06 VITALS — BP 122/78 | HR 72

## 2023-10-06 DIAGNOSIS — I5042 Chronic combined systolic (congestive) and diastolic (congestive) heart failure: Secondary | ICD-10-CM | POA: Diagnosis present

## 2023-10-06 DIAGNOSIS — I2510 Atherosclerotic heart disease of native coronary artery without angina pectoris: Secondary | ICD-10-CM | POA: Insufficient documentation

## 2023-10-06 NOTE — Patient Instructions (Addendum)
It was nice meeting you two today  Your blood pressure looks great today  Please continue your: Entresto 24-26mg  twice a day Carvedilol 12.5mg  twice a day Furosemide 40mg  as needed Farxiga 5mg  twice a day  Your blood sugar is more concerning however.   Hold your Novolog until your Dexcom comes. Once your Dexcom arrives, please apply it and only inject Novolog 5 Units before meals if your pre-meal blood sugar is greater than 180. This is a more conservative dose for you which should keep you out of the ER. Please bring your reader to your endocrinology appointment. I will see you back next month for more diabetes education.  Please message or call with any questions  Laural Golden, PharmD, BCACP, CDCES, CPP [address] [place], [place], [address] Phone: [phone], Fax: [phone]

## 2023-10-06 NOTE — Progress Notes (Signed)
Patient ID: Charles [name]                 DOB: 1945/12/22                      MRN: [id number]     HPI: Charles [name] is a 77 y.o. male referred by Dr. Tresa Endo to pharmacy clinic for HF medication management. PMH is significant for CHF, HTN, obesity, T2DM, A fib, CAD, and s/p CABG x 4.. Most recent LVEF 45-50% on 02/11/22.  Patient's wife had called office to clarify medication instructions. Had received prescription for Entresto 49-51 as well as 24-26 and was confused regarding which to take. Advised for patient to bring in all meds to pharmD visit.  Patient has been seen multiple times in ED recently for hypoglycemia. Currently injects Novolog 15 units three times a day before meals. Has not been checking blood sugar regularly. Had a CGM which malfunctioned and company was supposed to mail him a new one however that has not arrived. Also injects [place] 32 units daily. Was being followed by endocrinology however his provider is no longer at practice. Has follow up with new endocrinologist at end of this month.  Reports he often feels poorly in the morning after breakfast and has to nap. Complaints of dizziness, confusion, and being tired.   Unstable today walking into exam room and getting up from chair in lobby. BG in room today 107 (patient took all medications this morning and had a bowl of Cheerios, piece of toast, and 1 egg for breakfast).  Patient currently on carvedilol, Entresto, and Farxiga. Endo instructed him to take Farxiga 5mg  BID rather than 10mg  once daily. He can not recall why. Has furosemide at home but has not needed to take for months. Prescribed spironolactone last year but patient could not tolerate.   Current CHF meds:  Carvedilol 12.5mg  BID Farxiga 5mg  BID Furosemide 40mg  daily prn Entresto 24-26mg  BID  BP goal: <130/80   Wt Readings from Last 3 Encounters:  08/30/23 282 lb (127.9 kg)  08/10/23 275 lb (124.7 kg)  06/13/23 275 lb (124.7 kg)   BP Readings from  Last 3 Encounters:  09/25/23 (!) 177/89  08/30/23 102/72  08/11/23 (!) 154/83   Pulse Readings from Last 3 Encounters:  09/25/23 73  08/30/23 77  08/11/23 79    Renal function: CrCl cannot be calculated (Unknown ideal weight.).  Past Medical History:  Diagnosis Date   Acute cystitis with hematuria 12/23/2016   Allergy    Arthritis    "knees, hands, lower back" (07/29/2016)   Asthma    "touch q once & awhile" (02/05/2016)   Cardiomyopathy, ischemic    Carpal tunnel syndrome    Cataract    left eye   CHF (congestive heart failure) (HCC) 03/2018   chronic mixed   Chronic bronchitis (HCC)    Chronic kidney disease (CKD), stage III (moderate) (HCC)    Chronic venous insufficiency    with prior venous stasis ulcers x 1 2013   Complication of anesthesia    "when coming out, I choke and get very restless if breathing tube is still in"   Coronary artery disease    a. history of multiple stents to the LCx, LAD, and RCA b. s/p CABG in 08/2016 with LIMA-LAD, SVG-OM, SVG-PDA, and SVG-D1   Diabetes mellitus, type II (HCC)    type 2   Elevated creatine kinase level 2018   Exogenous obesity  severe   Hearing loss    Left ear   Helicobacter pylori gastritis 2016   History of blood transfusion ~ 2015   related to "when they went in to get my kidney stones"   History of kidney stones    Hx of colonic polyps 09/2006   inflammatory polyp at hepatic flexure. not adenomatous or malignant.    Hyperlipidemia    Hypertension    Iron deficiency anemia    Left bundle branch block (LBBB)    Nephrolithiasis    sees [place] [facility], sees every 4 months dr. Darrick Penna ckd stage 3   OSA on CPAP    "nasal CPAP" (07/29/2016) patient does not know settings    Osteoarthritis, knee    PAF (paroxysmal atrial fibrillation) (HCC)    a. on Xarelto   Pneumonia 03/2018   Rotator cuff tear last 2 years   right    Sinus headache    occ   SSS (sick sinus syndrome) (HCC)    Statin intolerance    Hx of.  Now tolerating Zetia & Livalo well.     Current Outpatient Medications on File Prior to Visit  Medication Sig Dispense Refill   silodosin (RAPAFLO) 8 MG CAPS capsule Take 8 mg by mouth daily.     acetaminophen (TYLENOL) 650 MG CR tablet Take 1,300 mg by mouth every 8 (eight) hours as needed (Arthritis).     alfuzosin (UROXATRAL) 10 MG 24 hr tablet Take 10 mg by mouth every evening.      aspirin EC 81 MG EC tablet Take 1 tablet (81 mg total) by mouth daily.     BD INSULIN SYRINGE U/F 30G X 1/2" 0.5 ML MISC USE TO INJECT INSULIN 3 TIMES DAILY 300 each 3   BD ULTRA-FINE PEN NEEDLES 29G X 12.7MM MISC USE DAILY TO INJECT TRESIBA INSULIN 100 each 3   carvedilol (COREG) 12.5 MG tablet TAKE 1 TABLET BY MOUTH 2 TIMES DAILY. 180 tablet 3   cephALEXin (KEFLEX) 500 MG capsule Take 1 capsule (500 mg total) by mouth 2 (two) times daily. 14 capsule 0   COLLAGEN PO Take 2,500 mg by mouth daily. With vitamin C     Continuous Blood Gluc Receiver (FREESTYLE LIBRE 14 DAY READER) DEVI USE TO MONITOR BLOOD SUGAR 1 each 1   Continuous Blood Gluc Sensor (FREESTYLE LIBRE 14 DAY SENSOR) MISC APPLY 1 SENSOR EVERY 14 DAYS AS DIRECTED 6 each 3   cyanocobalamin 1000 MCG tablet Take 1,000 mcg by mouth daily.     docusate sodium (COLACE) 100 MG capsule Take 1 capsule (100 mg total) by mouth every 12 (twelve) hours. As needed 60 capsule 1   ezetimibe (ZETIA) 10 MG tablet TAKE 1 TABLET BY MOUTH EVERY DAY 90 tablet 0   FARXIGA 5 MG TABS tablet TAKE 1 TABLET BY MOUTH EVERY DAY 30 tablet 5   fluticasone (FLONASE) 50 MCG/ACT nasal spray Place 1 spray into both nostrils daily as needed for allergies or rhinitis.     furosemide (LASIX) 40 MG tablet Take 1 tablet (40 mg total) by mouth daily as needed for fluid or edema. 90 tablet 2   gabapentin (NEURONTIN) 300 MG capsule TAKE 1 CAPSULE BY MOUTH THREE TIMES A DAY (Patient not taking: Reported on 08/30/2023) 270 capsule 2   Glucagon (GVOKE HYPOPEN 2-PACK) 1 MG/0.2ML SOAJ Inject to  treat severe low blood sugars 0.4 mL 1   insulin aspart (NOVOLOG) 100 UNIT/ML injection INJECT 15-30 UNITS UNDER THE SKIN THREE TIMES  DAILY BEFORE MEALS. 30 mL 1   insulin degludec (TRESIBA FLEXTOUCH) 100 UNIT/ML FlexTouch Pen INJECT 32 UNITS DAILY IN MORNING 30 mL 2   latanoprost (XALATAN) 0.005 % ophthalmic solution Place 1 drop into both eyes at bedtime.     meclizine (ANTIVERT) 25 MG tablet Take 25 mg by mouth 3 (three) times daily as needed (vertigo).     nitroGLYCERIN (NITROSTAT) 0.4 MG SL tablet PLACE 1 TABLET UNDER THE TONGUE EVERY 5 MINUTES AS NEEDED FOR CHEST PAIN 25 tablet 2   pantoprazole (PROTONIX) 20 MG tablet Take 1 tablet (20 mg total) by mouth daily for 14 days. 14 tablet 0   polyethylene glycol (MIRALAX / GLYCOLAX) 17 g packet Take 17 g by mouth 2 (two) times daily as needed for severe constipation. 14 each 0   promethazine (PHENERGAN) 25 MG tablet Take 1 tablet by mouth every 8 (eight) hours as needed for nausea.     sacubitril-valsartan (ENTRESTO) 24-26 MG Take 1 tablet by mouth 2 (two) times daily. 60 tablet 11   triamcinolone cream (KENALOG) 0.1 % Apply 1 application. topically 2 (two) times daily as needed (rash).     Vibegron (GEMTESA PO) Take by mouth.     XARELTO 20 MG TABS tablet TAKE 1 TABLET BY MOUTH DAILY WITH SUPPER 30 tablet 5   zinc gluconate 50 MG tablet Take 50 mg by mouth daily.     No current facility-administered medications on file prior to visit.    Allergies  Allergen Reactions   Black Pepper [[place]] Other (See Comments)    Irritates back of throat   Chocolate Hives, Shortness Of Breath and Swelling    Dark chocolate   Codeine Itching and Swelling   Oxytetracycline Other (See Comments)    Flushing in sunlight   Statins Other (See Comments)    Mental changes, muscle aches   Latex Itching and Other (See Comments)    Sensitive skin   Other Other (See Comments)    Opioids mess up his Stomach   Tape Rash and Other (See Comments)    SKIN IS VERY  SENSITIVE!!     Assessment/Plan:  1. CHF -  Patient BP in room today 122/78 which is at goal of <130/80. Will continue Entresto 24-26mg  BID as well as carvedilol, farxiga and furosemide PRN.  2. DM - Patient having multiple hypoglycemic events requiring EMS and hospitalizations. Is not familiar with the differences in his insulins and how rapid acting Novolog works. Likely he is administering too much Novolog without regard to meals or current BG levels. Advised this was dangerous and explained in detail signs and symptoms of hypoglycemia and how to correct using the Rule of 15s.   Encouraged him to begin checking BG more frequently at home. Likely Novolog will need to be reduced eventually. Encouraged him to reach out to check on status of CGM and if he does not receive soon to contact office and we will send to local pharmacy. Luckily has endocrinology appointment next week. I am currently more concerned about his frequent hypoglycemia episodes than if his BG trends higher so instructed patient to reduce Novolog to 5 units before meals only if BG >180. Should keep him more stable and out of ED until he can be seen by endocrinology. Will follow up next month for further DM education.  Laural Golden, PharmD, BCACP, CDCES, CPP [address] [place], [place], [address] Phone: [phone], Fax: [phone]

## 2023-10-07 ENCOUNTER — Telehealth: Payer: Self-pay | Admitting: Endocrinology

## 2023-10-07 ENCOUNTER — Other Ambulatory Visit: Payer: Self-pay

## 2023-10-07 DIAGNOSIS — E782 Mixed hyperlipidemia: Secondary | ICD-10-CM

## 2023-10-07 MED ORDER — EZETIMIBE 10 MG PO TABS
10.0000 mg | ORAL_TABLET | Freq: Every day | ORAL | 0 refills | Status: DC
Start: 2023-10-07 — End: 2024-01-02

## 2023-10-07 NOTE — Telephone Encounter (Signed)
Patient advising he is out of his Ezetimibe 10mg , please send to [facility] [place] and notify patient when done.

## 2023-10-07 NOTE — Telephone Encounter (Signed)
Zetia refill request complete, patient called and made aware.

## 2023-10-10 ENCOUNTER — Ambulatory Visit: Payer: Medicare Other | Attending: Cardiovascular Disease

## 2023-10-10 DIAGNOSIS — I5042 Chronic combined systolic (congestive) and diastolic (congestive) heart failure: Secondary | ICD-10-CM

## 2023-10-10 DIAGNOSIS — Z950 Presence of cardiac pacemaker: Secondary | ICD-10-CM | POA: Diagnosis not present

## 2023-10-11 NOTE — Progress Notes (Signed)
EPIC Encounter for ICM Monitoring  Patient Name: Charles [name] is a 77 y.o. male Date: 10/11/2023 Primary Care Physican: [name], PA-C Primary Cardiologist: Tresa Endo Electrophysiologist: Mealor Bi-V Pacing: 94%  03/30/2023 Office weight: 280 lbs 05/24/2023 Weight:  271 lbs 08/30/2023 Weight: 282 lbs   AT/AF Burden: 4.0% (taking Xarelto)                                                          Transmission reviewed.    CorVue thoracic impedance suggesting normal fluid levels within the last month.   Prescribed: Furosemide 40 mg take 1 tablet (40 mg total) by mouth daily as needed Farxiga 50 mg take 1 tablet daily Xarelto 20 mg take 1 tablet at supper daily   Labs: 09/24/2023 Creatinine 1.06, BUN 20, Potassium 3.9, Sodium 137, GFR >60  08/10/2023 Creatinine 1.04, BUN 19, Potassium 3.9, Sodium 137, GFR >60  06/01/2023 Creatinine 1.36, BUN 35, Potassium 4.6, Sodium 136, GFR 54  A complete set of results can be found in Results Review.   Recommendations:  No changes.   Follow-up plan: ICM clinic phone appointment on 11/14/2023.   91 day device clinic remote transmission 11/15/2023.   EP/Cardiology Office Visits:   11/01/2023 with Azalee Course, PA.   Recall 12/01/2023 with Otilio Saber, PA   Copy of ICM check sent to Dr. Nelly Laurence.   3 month ICM trend: 10/10/2023.    12-14 Month ICM trend:     [name], RN 10/11/2023 8:33 AM

## 2023-10-13 ENCOUNTER — Ambulatory Visit: Payer: Medicare Other | Admitting: Endocrinology

## 2023-10-19 ENCOUNTER — Encounter: Payer: Self-pay | Admitting: Endocrinology

## 2023-10-19 ENCOUNTER — Ambulatory Visit: Payer: Medicare Other | Admitting: Endocrinology

## 2023-10-19 VITALS — BP 108/60 | HR 70 | Resp 20 | Ht 69.0 in | Wt 270.2 lb

## 2023-10-19 DIAGNOSIS — E118 Type 2 diabetes mellitus with unspecified complications: Secondary | ICD-10-CM

## 2023-10-19 DIAGNOSIS — Z794 Long term (current) use of insulin: Secondary | ICD-10-CM | POA: Diagnosis not present

## 2023-10-19 MED ORDER — GABAPENTIN 300 MG PO CAPS: 300.0000 mg | ORAL_CAPSULE | Freq: Three times a day (TID) | ORAL | 2 refills | Status: DC

## 2023-10-19 MED ORDER — DAPAGLIFLOZIN PROPANEDIOL 5 MG PO TABS: 5.0000 mg | ORAL_TABLET | Freq: Every day | ORAL | 3 refills | Status: DC

## 2023-10-19 MED ORDER — TRESIBA FLEXTOUCH 100 UNIT/ML ~~LOC~~ SOPN: PEN_INJECTOR | SUBCUTANEOUS | 2 refills | Status: DC

## 2023-10-19 MED ORDER — INSULIN ASPART 100 UNIT/ML IJ SOLN: INTRAMUSCULAR | 1 refills | Status: DC

## 2023-10-19 NOTE — Patient Instructions (Addendum)
Does not color bnTresiba 32 units in am, NovoLog 5 -15 units at  most usually 3 meals a day. Take less of Novolog in case of exercise or increased physical activity.   Non-insulin hypoglycemic drugs the patient is taking are: Farxiga 2.5 mg twice daily. Well hydrate.
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
## 2023-10-25 ENCOUNTER — Encounter: Payer: Self-pay | Admitting: Endocrinology

## 2023-10-27 ENCOUNTER — Other Ambulatory Visit: Payer: Self-pay | Admitting: Internal Medicine

## 2023-10-27 ENCOUNTER — Other Ambulatory Visit: Payer: Self-pay

## 2023-10-27 DIAGNOSIS — Z794 Long term (current) use of insulin: Secondary | ICD-10-CM

## 2023-10-27 MED ORDER — BD PEN NEEDLE ORIGINAL U/F 29G X 12.7MM MISC: 3 refills | Status: DC

## 2023-11-01 ENCOUNTER — Encounter: Payer: Self-pay | Admitting: Physician Assistant

## 2023-11-01 ENCOUNTER — Ambulatory Visit: Payer: Medicare Other | Attending: Physician Assistant | Admitting: Physician Assistant

## 2023-11-01 VITALS — BP 90/60 | HR 90 | Ht 69.0 in | Wt 281.0 lb

## 2023-11-01 DIAGNOSIS — I480 Paroxysmal atrial fibrillation: Secondary | ICD-10-CM | POA: Diagnosis present

## 2023-11-01 DIAGNOSIS — E119 Type 2 diabetes mellitus without complications: Secondary | ICD-10-CM

## 2023-11-01 DIAGNOSIS — I10 Essential (primary) hypertension: Secondary | ICD-10-CM | POA: Diagnosis present

## 2023-11-01 DIAGNOSIS — I2510 Atherosclerotic heart disease of native coronary artery without angina pectoris: Secondary | ICD-10-CM | POA: Diagnosis present

## 2023-11-01 DIAGNOSIS — E785 Hyperlipidemia, unspecified: Secondary | ICD-10-CM

## 2023-11-01 DIAGNOSIS — G4733 Obstructive sleep apnea (adult) (pediatric): Secondary | ICD-10-CM | POA: Diagnosis present

## 2023-11-01 DIAGNOSIS — Z950 Presence of cardiac pacemaker: Secondary | ICD-10-CM

## 2023-11-01 MED ORDER — CARVEDILOL 6.25 MG PO TABS: 6.2500 mg | ORAL_TABLET | Freq: Two times a day (BID) | ORAL | 3 refills | Status: DC

## 2023-11-01 NOTE — Progress Notes (Unsigned)
Cardiology Office Note:  .   Date:  11/03/2023  ID:  Charles [name], DOB 10-Jan-1946, MRN [id number] PCP: [name], PA-C  [facility] Providers Cardiologist:  Nicki Guadalajara, MD Electrophysiologist:  Maurice Small, MD     History of Present Illness: .   Charles [name] is a 77 y.o. male with PMH of CAD s/p CABG, PAF, sick sinus syndrome s/p PPM, OSA on CPAP, chronic venous insufficiency, GERD, hypertension, hyperlipidemia, and DM2.  Patient had known CAD since 1995 and a has underwent numerous PTCA's and stent placement to his left circumflex, LAD and RCA.  He was admitted in September 2017 with NSTEMI and treated with PTCA by Dr. Eldridge Dace to his D1 and mid left circumflex vessel for in-stent restenosis.  EF was 35 to 40%.  He underwent repeat cardiac catheterization in October 2017 as he has developed in-stent restenosis in each of the 3 sites treated in September and had significant multivessel CAD.  CABG was recommended and that was ultimately performed by Dr. Tyrone Sage on 09/15/2016 with LIMA-LAD, SVG-OM, SVG-PDA, and SVG-diagonal.  He has cough was lisinopril.  He was hospitalized in May 2019 with acute diastolic and systolic heart failure exacerbation.  Echocardiogram on 04/01/2018 showed EF 30%, moderate MR.  Follow-up echocardiogram in June 2022 showed EF improved to 55 to 60% without wall motion abnormality, grade 1 DD, dilated ascending aorta at 45 mm.  Last echocardiogram in March 2023 showed EF 45 to 50%, global hypokinesis, severe biatrial enlargement, mild MR.  He was last seen by Dr. Tresa Endo in April 2024 at which time he was doing well.  55-month follow-up was recommended.  Carotid Doppler obtained in July 2024 showed mild disease bilaterally.  Patient presents today for follow-up.  He denies any chest pain, however blood pressure has been low recently.  He denies any chest pain and appears to be euvolemic on exam.  I recommended reduce carvedilol to 6.25 mg twice a day.   Otherwise, he can follow-up in 4 months.  ROS:   He denies chest pain, palpitations, dyspnea, pnd, orthopnea, n, v, dizziness, syncope, edema, weight gain, or early satiety. All other systems reviewed and are otherwise negative except as noted above.    Studies Reviewed: .        Cardiac Studies & Procedures   CARDIAC CATHETERIZATION  CARDIAC CATHETERIZATION 09/08/2016  Narrative  1st Diag lesion, 75 %stenosed.  Prox Cx lesion, 0 %stenosed.  Ost RCA to Prox RCA lesion, 10 %stenosed.  Mid Cx lesion, 80 %stenosed.  Ost 2nd Mrg to 2nd Mrg lesion, 90 %stenosed.  Prox LAD lesion, 65 %stenosed.  Ost 1st Diag to 1st Diag lesion, 99 %stenosed.  RPDA-1 lesion, 70 %stenosed.  RPDA-2 lesion, 75 %stenosed.  Dist LAD lesion, 80 %stenosed.  Ost Ramus to Ramus lesion, 95 %stenosed.  Mid Cx to Dist Cx lesion, 90 %stenosed.  The left ventricular ejection fraction is 35-45% by visual estimate.  There is moderate left ventricular systolic dysfunction.  LV end diastolic pressure is mildly elevated.   Rapid redevelopment of in-stent restenosis in each of the 3 sites treated in September.  FFR across the mid LAD stent is 0.81. Note that there is significant distal LAD disease beyond this lesion which is likely causing the FFR value to be falsely elevated. In other words, the LAD FFR in the absence of distal disease could easily be less than 0.8.  Significant disease is noted in the mid PDA, mid and distal circumflex (ISR),  small first obtuse marginal, large second obtuse (ISR) marginal, second diagonal (ISR), with intermediate angiographic stenosis in the mid LAD stent (ISR). The distal LAD proximal to the apex contains segmental 80% narrowing.  Global left ventricular dysfunction with regional variation. EF 35-45%.  RECOMMENDATIONS:   Consider surgical revascularization if possible.  Findings Coronary Findings Diagnostic  Dominance: Right  Left Anterior Descending The  lesion was previously treated. Pressure wire/FFR was performed on the lesion. FFR: 0.81.  First Diagonal Branch Vessel is small in size. The lesion was previously treated.  Ramus Intermedius Vessel is small.  Left Circumflex Previously placed Prox Cx bare metal stent is widely patent. The lesion was previously treated.  Second Obtuse Marginal Branch The lesion was previously treated.  Right Coronary Artery The lesion was previously treated using a bare metal stent over 2 years ago.  Right Posterior Descending Artery  Intervention  No interventions have been documented.    ECHOCARDIOGRAM  ECHOCARDIOGRAM COMPLETE 11/02/2023  Narrative ECHOCARDIOGRAM REPORT    Patient Name:   Charles [name] Date of Exam: 11/02/2023 Medical Rec #:  [id number]        Height:       69.0 in Accession #:    [id number]       Weight:       281.0 lb Date of Birth:  04-Feb-1946        BSA:          2.387 m Patient Age:    77 years         BP:           108/60 mmHg Patient Gender: M                HR:           72 bpm. Exam Location:  [address]  Procedure: 2D Echo, Cardiac Doppler, Color Doppler and Intracardiac Opacification Agent  Indications:    I51.9 Decreased LV function  History:        Patient has prior history of Echocardiogram examinations, most recent 02/11/2022. CHF, CAD, Pacemaker, Arrythmias:Atrial Fibrillation; Risk Factors:Hypertension, Diabetes and Former Smoker. Sick sinus syndrome. Obesity. Chronic bronchitis.  Sonographer:    Cathie Beams RCS Referring Phys: [phone] THOMAS A KELLY  IMPRESSIONS   1. Left ventricular ejection fraction, by estimation, is 35 to 40%. The left ventricle has moderately decreased function. The left ventricle demonstrates global hypokinesis. The left ventricular internal cavity size was mildly dilated. There is mild concentric left ventricular hypertrophy. Left ventricular diastolic parameters are consistent with Grade I diastolic dysfunction  (impaired relaxation). 2. Right ventricular systolic function is mildly reduced. The right ventricular size is not well visualized. There is normal pulmonary artery systolic pressure. The estimated right ventricular systolic pressure is 16.1 mmHg. 3. The mitral valve is normal in structure. Trivial mitral valve regurgitation. No evidence of mitral stenosis. 4. The aortic valve is tricuspid. There is mild calcification of the aortic valve. Aortic valve regurgitation is not visualized. No aortic stenosis is present. 5. Aortic dilatation noted. There is moderate dilatation of the ascending aorta, measuring 45 mm. 6. The inferior vena cava is normal in size with greater than 50% respiratory variability, suggesting right atrial pressure of 3 mmHg. 7. Technically difficult study with poor acoustic windows.  FINDINGS Left Ventricle: Left ventricular ejection fraction, by estimation, is 35 to 40%. The left ventricle has moderately decreased function. The left ventricle demonstrates global hypokinesis. The left ventricular internal cavity size was mildly dilated. There is  mild concentric left ventricular hypertrophy. Left ventricular diastolic parameters are consistent with Grade I diastolic dysfunction (impaired relaxation).  Right Ventricle: The right ventricular size is not well visualized. Right vetricular wall thickness was not well visualized. Right ventricular systolic function is mildly reduced. There is normal pulmonary artery systolic pressure. The tricuspid regurgitant velocity is 1.81 m/s, and with an assumed right atrial pressure of 3 mmHg, the estimated right ventricular systolic pressure is 16.1 mmHg.  Left Atrium: Left atrial size was normal in size.  Right Atrium: Right atrial size was normal in size.  Pericardium: There is no evidence of pericardial effusion.  Mitral Valve: The mitral valve is normal in structure. Trivial mitral valve regurgitation. No evidence of mitral valve  stenosis.  Tricuspid Valve: The tricuspid valve is normal in structure. Tricuspid valve regurgitation is trivial.  Aortic Valve: The aortic valve is tricuspid. There is mild calcification of the aortic valve. Aortic valve regurgitation is not visualized. No aortic stenosis is present.  Pulmonic Valve: The pulmonic valve was not well visualized. Pulmonic valve regurgitation is not visualized.  Aorta: Aortic dilatation noted. There is moderate dilatation of the ascending aorta, measuring 45 mm.  Venous: The inferior vena cava is normal in size with greater than 50% respiratory variability, suggesting right atrial pressure of 3 mmHg.  IAS/Shunts: No atrial level shunt detected by color flow Doppler.  Additional Comments: A device lead is visualized in the right ventricle.   LEFT VENTRICLE PLAX 2D LVIDd:         6.20 cm   Diastology LVIDs:         4.90 cm   LV e' medial:    4.57 cm/s LV PW:         1.40 cm   LV E/e' medial:  10.9 LV IVS:        1.30 cm   LV e' lateral:   6.64 cm/s LVOT diam:     2.10 cm   LV E/e' lateral: 7.5 LV SV:         60 LV SV Index:   25 LVOT Area:     3.46 cm   RIGHT VENTRICLE RV Basal diam:  3.10 cm RV S prime:     7.62 cm/s TAPSE (M-mode): 1.8 cm RVSP:           16.1 mmHg  LEFT ATRIUM           Index        RIGHT ATRIUM           Index LA diam:      5.30 cm 2.22 cm/m   RA Pressure: 3.00 mmHg LA Vol (A4C): 31.5 ml 13.20 ml/m  RA Area:     14.00 cm RA Volume:   32.20 ml  13.49 ml/m AORTIC VALVE LVOT Vmax:   86.20 cm/s LVOT Vmean:  59.100 cm/s LVOT VTI:    0.174 m  AORTA Ao Root diam: 3.70 cm Ao Asc diam:  4.50 cm  MITRAL VALVE               TRICUSPID VALVE MV Area (PHT): 2.73 cm    TR Peak grad:   13.1 mmHg MV Decel Time: 278 msec    TR Vmax:        181.00 cm/s MV E velocity: 49.80 cm/s  Estimated RAP:  3.00 mmHg MV A velocity: 78.60 cm/s  RVSP:           16.1 mmHg MV E/A ratio:  0.63 SHUNTS  Systemic VTI:  0.17 m Systemic Diam: 2.10  cm  Dalton McleanMD Electronically signed by Wilfred Lacy Signature Date/Time: 11/02/2023/4:07:32 PM    Final  TEE  ECHO INTRAOPERATIVE TEE 09/15/2016  Interpretation Summary  Left ventricle: Normal cavity size. LV systolic function is mildly reduced with an EF of 45-50%. Wall motion is abnormal. Inferoseptal wall motion is hypokinetic. No thrombus present.  Septum: No Patent Foramen Ovale present.  Left atrium: Patent foramen ovale not present.  Aortic valve: The valve is trileaflet. No stenosis. Trace regurgitation.  Mitral valve: Non-specific thickening. Mild to moderate regurgitation.  Right ventricle: Normal cavity size. Mildly reduced systolic function.  Pulmonic valve: Trace regurgitation.            Risk Assessment/Calculations:    CHA2DS2-VASc Score = 6   This indicates a 9.7% annual risk of stroke. The patient's score is based upon: CHF History: 1 HTN History: 1 Diabetes History: 1 Stroke History: 0 Vascular Disease History: 1 Age Score: 2 Gender Score: 0           Physical Exam:   VS:  BP 90/60   Pulse 90   Ht 5\' 9"  (1.753 m)   Wt 281 lb (127.5 kg)   SpO2 97%   BMI 41.50 kg/m    Wt Readings from Last 3 Encounters:  11/01/23 281 lb (127.5 kg)  10/19/23 270 lb 3.2 oz (122.6 kg)  08/30/23 282 lb (127.9 kg)    GEN: Well nourished, well developed in no acute distress NECK: No JVD; No carotid bruits CARDIAC: RRR, no murmurs, rubs, gallops RESPIRATORY:  Clear to auscultation without rales, wheezing or rhonchi  ABDOMEN: Soft, non-tender, non-distended EXTREMITIES:  No edema; No deformity   ASSESSMENT AND PLAN: .    Coronary Artery Disease (CAD) History of CAD since 1995 with multiple PTCA and stent placements. CABG performed in 2017. Last cardiac catheterization in 2017 showed multivessel CAD. -History of hospitalization in 2019 for acute decompensated heart failure. Echocardiogram in 2020 showed EF of 30% with moderate MR. Follow-up  echocardiogram in 2022 showed improved EF to 55-60% with grade 1 diastolic dysfunction. Recent low blood pressure readings. -Reduce Carvedilol to 6.25mg  twice daily. -Check blood pressure regularly at home. -Follow-up in 4 months.  PAF: Continue Xarelto  History of pacemaker: Followed by EP service  Hypertension Managed with Carvedilol and other medications. -Reduce Carvedilol to 6.25mg  twice daily due to recent low blood pressure readings.  Hyperlipidemia Chronic condition. -Continue current management.  DM II: Managed by primary care provider  Obstructive Sleep Apnea (OSA) Managed with CPAP. -Continue CPAP therapy.     Dispo: Follow-up in 4 months  Signed, Azalee Course, [place]

## 2023-11-01 NOTE — Patient Instructions (Signed)
Medication Instructions:  DECREASE CARVEDILOL 6.25 MG TWICE DAILY *If you need a refill on your cardiac medications before your next appointment, please call your pharmacy*   Lab Work: NO LABS If you have labs (blood work) drawn today and your tests are completely normal, you will receive your results only by: MyChart Message (if you have MyChart) OR A paper copy in the mail If you have any lab test that is abnormal or we need to change your treatment, we will call you to review the results.   Testing/Procedures: NO TESTING   Follow-Up: At [facility], you and your health needs are our priority.  As part of our continuing mission to provide you with exceptional heart care, we have created designated Provider Care Teams.  These Care Teams include your primary Cardiologist (physician) and Advanced Practice Providers (APPs -  Physician Assistants and Nurse Practitioners) who all work together to provide you with the care you need, when you need it.  We recommend signing up for the patient portal called "MyChart".  Sign up information is provided on this After Visit Summary.  MyChart is used to connect with patients for Virtual Visits (Telemedicine).  Patients are able to view lab/test results, encounter notes, upcoming appointments, etc.  Non-urgent messages can be sent to your provider as well.   To learn more about what you can do with MyChart, go to [email or web address].    Your next appointment:   4 month(s)  Provider:   Nicki Guadalajara, MD

## 2023-11-02 ENCOUNTER — Ambulatory Visit (HOSPITAL_COMMUNITY): Payer: Medicare Other | Attending: Cardiovascular Disease

## 2023-11-02 DIAGNOSIS — I255 Ischemic cardiomyopathy: Secondary | ICD-10-CM

## 2023-11-02 LAB — ECHOCARDIOGRAM COMPLETE
Area-P 1/2: 2.73 cm2
S' Lateral: 4.9 cm

## 2023-11-02 MED ORDER — PERFLUTREN LIPID MICROSPHERE
1.0000 mL | INTRAVENOUS | Status: AC | PRN
Start: 2023-11-02 — End: 2023-11-02
  Administered 2023-11-02: 3 mL via INTRAVENOUS

## 2023-11-04 MED ORDER — DEXAMETHASONE SODIUM PHOSPHATE 10 MG/ML IJ SOLN
INTRAMUSCULAR | Status: AC
  Filled 2023-11-04: qty 1

## 2023-11-04 MED ORDER — MIDAZOLAM HCL 2 MG/2ML IJ SOLN
INTRAMUSCULAR | Status: AC
  Filled 2023-11-04: qty 2

## 2023-11-04 MED ORDER — PHENYLEPHRINE 80 MCG/ML (10ML) SYRINGE FOR IV PUSH (FOR BLOOD PRESSURE SUPPORT)
PREFILLED_SYRINGE | INTRAVENOUS | Status: AC
  Filled 2023-11-04: qty 10

## 2023-11-04 MED ORDER — EPHEDRINE 5 MG/ML INJ
INTRAVENOUS | Status: AC
  Filled 2023-11-04: qty 5

## 2023-11-04 MED ORDER — LIDOCAINE 2% (20 MG/ML) 5 ML SYRINGE
INTRAMUSCULAR | Status: AC
  Filled 2023-11-04: qty 5

## 2023-11-04 MED ORDER — SUCCINYLCHOLINE CHLORIDE 200 MG/10ML IV SOSY
PREFILLED_SYRINGE | INTRAVENOUS | Status: AC
  Filled 2023-11-04: qty 10

## 2023-11-04 MED ORDER — ONDANSETRON HCL 4 MG/2ML IJ SOLN
INTRAMUSCULAR | Status: AC
  Filled 2023-11-04: qty 2

## 2023-11-04 MED ORDER — FENTANYL CITRATE (PF) 100 MCG/2ML IJ SOLN
INTRAMUSCULAR | Status: AC
  Filled 2023-11-04: qty 2

## 2023-11-07 ENCOUNTER — Other Ambulatory Visit: Payer: Self-pay

## 2023-11-07 DIAGNOSIS — Z794 Long term (current) use of insulin: Secondary | ICD-10-CM

## 2023-11-07 MED ORDER — BD INSULIN SYRINGE U/F 30G X 1/2" 0.5 ML MISC: 3 refills | Status: DC

## 2023-11-14 ENCOUNTER — Ambulatory Visit: Payer: Medicare Other | Attending: Cardiovascular Disease

## 2023-11-14 ENCOUNTER — Ambulatory Visit: Payer: Medicare Other | Attending: Cardiology | Admitting: Pharmacist

## 2023-11-14 VITALS — BP 139/85 | HR 77 | Wt 274.6 lb

## 2023-11-14 DIAGNOSIS — E1159 Type 2 diabetes mellitus with other circulatory complications: Secondary | ICD-10-CM | POA: Diagnosis not present

## 2023-11-14 DIAGNOSIS — Z950 Presence of cardiac pacemaker: Secondary | ICD-10-CM | POA: Diagnosis not present

## 2023-11-14 DIAGNOSIS — I5042 Chronic combined systolic (congestive) and diastolic (congestive) heart failure: Secondary | ICD-10-CM | POA: Diagnosis not present

## 2023-11-14 DIAGNOSIS — Z794 Long term (current) use of insulin: Secondary | ICD-10-CM | POA: Diagnosis present

## 2023-11-14 NOTE — Patient Instructions (Addendum)
It was good seeing you again  Morning (fasting) Wake up blood sugar: 80-130  2 hours after a meal: Less than 180  Try to focus on vegetables, lean proteins, healthy (unsaturated fats) and whole grains  Continue your Tresiba 32 units daily and your Novolog 5 units three times a day before meals  For your heart: Continue carvedilol 3.25mg  twice a day Entresto 24-60mg  twice a day Farxiga 5mg  (1/2 tablet twice a day)  Let me know if you want to change from the Novolog vials to the pens  Continue to watch your blood sugar and blood pressure at home and let us know if you have any questions  Laural Golden, PharmD, BCACP, CDCES, CPP [address], [address] [place], [place], [address] Phone: [phone], Fax: [phone]

## 2023-11-14 NOTE — Progress Notes (Signed)
.Patient ID: Charles [name]                 DOB: 1946-02-25                      MRN: [id number]     HPI: Charles [name] is a 77 y.o. male referred by Dr. Tresa Endo to pharmacy clinic for HF medication management. PMH is significant for T2DM, CHF, CAD, MI, A Fib, and CABG x 4. Most recent LVEF 35-40% on 11/02/23.  At first PharmD visit, BP was well controlled and patient was not experiencing and CHF symptoms. However, blood sugar was uncontrolled with frequent hypoglycemic and hyperglycemic episodes. Patient was transported to ED multiple times with hypoglycemia.  To try to prevent further hypoglycemic episodes. Novolog was reduced to 5 untis before meals if BG >180 until patient could be seen by endocrinology. Had endocrinology visit with new provider on 10/19/23 and Novolo dose was adjusted to 5-15 units before meals.   Patient knows now to check BG more often especially before injecting Novolog. Currently wearing a freestyle Libre sensor.  Reviewed sensor readings. Average BG from last 14 days 183 which is improved. No hypogycemic episodes noted.  Discussion with patient today included the following: cardiac medication indications, review of GDMT including reasoning behind medication titration, importance of medication adherence, and patient engagement.   Patient endorses dizziness when standing up from. Denies CP, SOB, or LEE. Does not add salt to foods. Wife is trying to get him to eat healthier.   Current CHF meds: Carvedilol 6.25mg  BID Entresto 24-26mg  BID Farxiga 2.5mg  BID (from endocrinology)  Current DM meds: Tresiba 32 units daily Novolog 5units TID before meals    Wt Readings from Last 3 Encounters:  11/14/23 274 lb 9.6 oz (124.6 kg)  11/01/23 281 lb (127.5 kg)  10/19/23 270 lb 3.2 oz (122.6 kg)   BP Readings from Last 3 Encounters:  11/14/23 139/85  11/01/23 90/60  10/19/23 108/60   Pulse Readings from Last 3 Encounters:  11/14/23 77  11/01/23 90  10/19/23 70     Renal function: CrCl cannot be calculated (Patient's most recent lab result is older than the maximum 21 days allowed.).  Past Medical History:  Diagnosis Date   Acute cystitis with hematuria 12/23/2016   Allergy    Arthritis    "knees, hands, lower back" (07/29/2016)   Asthma    "touch q once & awhile" (02/05/2016)   Cardiomyopathy, ischemic    Carpal tunnel syndrome    Cataract    left eye   CHF (congestive heart failure) (HCC) 03/2018   chronic mixed   Chronic bronchitis (HCC)    Chronic kidney disease (CKD), stage III (moderate) (HCC)    Chronic venous insufficiency    with prior venous stasis ulcers x 1 2013   Complication of anesthesia    "when coming out, I choke and get very restless if breathing tube is still in"   Coronary artery disease    a. history of multiple stents to the LCx, LAD, and RCA b. s/p CABG in 08/2016 with LIMA-LAD, SVG-OM, SVG-PDA, and SVG-D1   Diabetes mellitus, type II (HCC)    type 2   Elevated creatine kinase level 2018   Exogenous obesity    severe   Hearing loss    Left ear   Helicobacter pylori gastritis 2016   History of blood transfusion ~ 2015   related to "when they went in to get my  kidney stones"   History of kidney stones    Hx of colonic polyps 09/2006   inflammatory polyp at hepatic flexure. not adenomatous or malignant.    Hyperlipidemia    Hypertension    Iron deficiency anemia    Left bundle branch block (LBBB)    Nephrolithiasis    sees [facility], sees every 4 months dr. Darrick Penna ckd stage 3   OSA on CPAP    "nasal CPAP" (07/29/2016) patient does not know settings    Osteoarthritis, knee    PAF (paroxysmal atrial fibrillation) (HCC)    a. on Xarelto   Pneumonia 03/2018   Rotator cuff tear last 2 years   right    Sinus headache    occ   SSS (sick sinus syndrome) (HCC)    Statin intolerance    Hx of. Now tolerating Zetia & Livalo well.     Current Outpatient Medications on File Prior to Visit  Medication  Sig Dispense Refill   acetaminophen (TYLENOL) 650 MG CR tablet Take 1,300 mg by mouth every 8 (eight) hours as needed (Arthritis).     alfuzosin (UROXATRAL) 10 MG 24 hr tablet Take 10 mg by mouth every evening.      aspirin EC 81 MG EC tablet Take 1 tablet (81 mg total) by mouth daily.     carvedilol (COREG) 6.25 MG tablet Take 1 tablet (6.25 mg total) by mouth 2 (two) times daily. 180 tablet 3   cephALEXin (KEFLEX) 500 MG capsule Take 1 capsule (500 mg total) by mouth 2 (two) times daily. 14 capsule 0   COLLAGEN PO Take 2,500 mg by mouth daily. With vitamin C     Continuous Blood Gluc Receiver (FREESTYLE LIBRE 14 DAY READER) DEVI USE TO MONITOR BLOOD SUGAR 1 each 1   Continuous Blood Gluc Sensor (FREESTYLE LIBRE 14 DAY SENSOR) MISC APPLY 1 SENSOR EVERY 14 DAYS AS DIRECTED 6 each 3   cyanocobalamin 1000 MCG tablet Take 1,000 mcg by mouth daily.     dapagliflozin propanediol (FARXIGA) 5 MG TABS tablet Take 1 tablet (5 mg total) by mouth daily. 90 tablet 3   docusate sodium (COLACE) 100 MG capsule TAKE 1 CAPSULE (100 MG TOTAL) BY MOUTH EVERY 12 (TWELVE) HOURS. AS NEEDED 60 capsule 1   ezetimibe (ZETIA) 10 MG tablet Take 1 tablet (10 mg total) by mouth daily. 90 tablet 0   fluticasone (FLONASE) 50 MCG/ACT nasal spray Place 1 spray into both nostrils daily as needed for allergies or rhinitis.     furosemide (LASIX) 40 MG tablet Take 1 tablet (40 mg total) by mouth daily as needed for fluid or edema. 90 tablet 2   gabapentin (NEURONTIN) 300 MG capsule Take 1 capsule (300 mg total) by mouth 3 (three) times daily. TAKE 1 CAPSULE BY MOUTH THREE TIMES A DAY (Patient not taking: Reported on 11/01/2023) 270 capsule 2   Glucagon (GVOKE HYPOPEN 2-PACK) 1 MG/0.2ML SOAJ Inject to treat severe low blood sugars 0.4 mL 1   insulin aspart (NOVOLOG) 100 UNIT/ML injection INJECT 5-15 UNITS UNDER THE SKIN THREE TIMES DAILY BEFORE MEALS. 30 mL 1   insulin degludec (TRESIBA FLEXTOUCH) 100 UNIT/ML FlexTouch Pen Inject 32  units daily in morning 30 mL 2   Insulin Pen Needle (BD ULTRA-FINE PEN NEEDLES) 29G X 12.7MM MISC USE DAILY TO INJECT TRESIBA INSULIN 100 each 3   Insulin Syringe-Needle U-100 (BD INSULIN SYRINGE U/F) 30G X 1/2" 0.5 ML MISC USE TO INJECT INSULIN 3 TIMES DAILY 300  each 3   latanoprost (XALATAN) 0.005 % ophthalmic solution Place 1 drop into both eyes at bedtime.     meclizine (ANTIVERT) 25 MG tablet Take 25 mg by mouth 3 (three) times daily as needed (vertigo).     nitroGLYCERIN (NITROSTAT) 0.4 MG SL tablet PLACE 1 TABLET UNDER THE TONGUE EVERY 5 MINUTES AS NEEDED FOR CHEST PAIN 25 tablet 2   pantoprazole (PROTONIX) 20 MG tablet Take 1 tablet (20 mg total) by mouth daily for 14 days. 14 tablet 0   polyethylene glycol (MIRALAX / GLYCOLAX) 17 g packet Take 17 g by mouth 2 (two) times daily as needed for severe constipation. 14 each 0   promethazine (PHENERGAN) 25 MG tablet Take 1 tablet by mouth every 8 (eight) hours as needed for nausea.     sacubitril-valsartan (ENTRESTO) 24-26 MG Take 1 tablet by mouth 2 (two) times daily. 60 tablet 11   silodosin (RAPAFLO) 8 MG CAPS capsule Take 8 mg by mouth daily.     triamcinolone cream (KENALOG) 0.1 % Apply 1 application. topically 2 (two) times daily as needed (rash).     Vibegron (GEMTESA PO) Take by mouth.     XARELTO 20 MG TABS tablet TAKE 1 TABLET BY MOUTH DAILY WITH SUPPER 30 tablet 5   zinc gluconate 50 MG tablet Take 50 mg by mouth daily.     No current facility-administered medications on file prior to visit.    Allergies  Allergen Reactions   Black Pepper [[place]] Other (See Comments)    Irritates back of throat   Chocolate Hives, Shortness Of Breath and Swelling    Dark chocolate   Codeine Itching and Swelling   Oxytetracycline Other (See Comments)    Flushing in sunlight   Statins Other (See Comments)    Mental changes, muscle aches   Latex Itching and Other (See Comments)    Sensitive skin   Other Other (See Comments)    Opioids mess  up his Stomach   Tape Rash and Other (See Comments)    SKIN IS VERY SENSITIVE!!     Assessment/Plan:  1. CHF -  Patient BP in room 139/85 which is above goal of <130/80 however previous readings have been better controlled. Patient feels well and is asymptomatic.  Continue: Entresto 24-26mg  BID Carvedilol 6.25mg  BID Farxiga 2.5mg  BID  2. DM - Blood glucose better controlled in past month. No hypoglycemic episodes or hospitalizations. Frequent elevations which are diet related.  Printed planning healthy meals handout and went over with patient and wife. Encouraged increasing vegetables, lean proteins, healthy fats, and whole grains. Will continue Novolog 5-15 units before meals. Patient uses Novolog vials and can be challenging for him to draw up small amounts of insulin in syringes. Advised could switch to Novlog pens if that would be more convenient for him.  Patient to continue to monitor BG at home using sensor.  Continue Tresiba 32 units daily Continue Novolog 5-15 units TID Follow up as needed  Laural Golden, PharmD, BCACP, CDCES, CPP [address], [address] [place], [place], [address] Phone: [phone], Fax: [phone]

## 2023-11-15 ENCOUNTER — Ambulatory Visit (INDEPENDENT_AMBULATORY_CARE_PROVIDER_SITE_OTHER): Payer: Medicare Other

## 2023-11-15 DIAGNOSIS — I5042 Chronic combined systolic (congestive) and diastolic (congestive) heart failure: Secondary | ICD-10-CM

## 2023-11-15 DIAGNOSIS — I480 Paroxysmal atrial fibrillation: Secondary | ICD-10-CM

## 2023-11-18 ENCOUNTER — Telehealth: Payer: Self-pay

## 2023-11-18 NOTE — Telephone Encounter (Signed)
 Remote ICM transmission received.  Attempted call to patient regarding ICM remote transmission and left detailed message per DPR.  Left ICM phone number and advised to return call for any fluid symptoms or questions. Next ICM remote transmission scheduled 12/19/2023.

## 2023-11-18 NOTE — Progress Notes (Signed)
EPIC Encounter for ICM Monitoring  Patient Name: Charles [name] is a 77 y.o. male Date: 11/18/2023 Primary Care Physican: Charles Peak, PA-C Primary Cardiologist: Charles [name] Electrophysiologist: Charles [name] Bi-V Pacing: 94%  03/30/2023 Office weight: 280 lbs 05/24/2023 Weight:  271 lbs 08/30/2023 Weight: 282 lbs   AT/AF Burden: 4.0% (taking Xarelto)                                                          Attempted call to patient and unable to reach.  Left detailed message per DPR regarding transmission.  Transmission results reviewed.    CorVue thoracic impedance suggesting normal fluid levels with the exception of possible fluid accumulation from 11/29-12/7.   Prescribed: Furosemide 40 mg take 1 tablet (40 mg total) by mouth daily as needed Farxiga 50 mg take 1 tablet daily Xarelto 20 mg take 1 tablet at supper daily   Labs: 09/24/2023 Creatinine 1.06, BUN 20, Potassium 3.9, Sodium 137, GFR >60  08/10/2023 Creatinine 1.04, BUN 19, Potassium 3.9, Sodium 137, GFR >60  06/01/2023 Creatinine 1.36, BUN 35, Potassium 4.6, Sodium 136, GFR 54  A complete set of results can be found in Results Review.   Recommendations:  Left voice mail with ICM number and encouraged to call if experiencing any fluid symptoms.   Follow-up plan: ICM clinic phone appointment on 12/19/2023.   91 day device clinic remote transmission 02/14/2024.   EP/Cardiology Office Visits:   03/12/2024 with Dr Charles [name].   Recall 12/01/2023 with Charles Saber, PA   Copy of ICM check sent to Dr. Nelly [name].   3 month ICM trend: 11/14/2023.    12-14 Month ICM trend:     Charles Soda, RN 11/18/2023 12:31 PM

## 2023-11-21 ENCOUNTER — Ambulatory Visit (INDEPENDENT_AMBULATORY_CARE_PROVIDER_SITE_OTHER): Payer: Medicare Other

## 2023-11-21 DIAGNOSIS — I480 Paroxysmal atrial fibrillation: Secondary | ICD-10-CM | POA: Diagnosis not present

## 2023-11-21 LAB — CUP PACEART REMOTE DEVICE CHECK
Battery Remaining Longevity: 44 mo
Battery Remaining Percentage: 44 %
Battery Voltage: 2.96 V
Brady Statistic AP VP Percent: 89 %
Brady Statistic AP VS Percent: 1 %
Brady Statistic AS VP Percent: 6.5 %
Brady Statistic AS VS Percent: 2.2 %
Brady Statistic RA Percent Paced: 84 %
Date Time Interrogation Session: 20241230074039
Implantable Lead Connection Status: 753985
Implantable Lead Connection Status: 753985
Implantable Lead Connection Status: 753985
Implantable Lead Implant Date: 20091007
Implantable Lead Implant Date: 20091007
Implantable Lead Implant Date: 20200922
Implantable Lead Location: 753858
Implantable Lead Location: 753859
Implantable Lead Location: 753860
Implantable Pulse Generator Implant Date: 20200922
Lead Channel Impedance Value: 1250 Ohm
Lead Channel Impedance Value: 450 Ohm
Lead Channel Impedance Value: 650 Ohm
Lead Channel Pacing Threshold Amplitude: 0.625 V
Lead Channel Pacing Threshold Amplitude: 0.75 V
Lead Channel Pacing Threshold Amplitude: 1.375 V
Lead Channel Pacing Threshold Pulse Width: 0.4 ms
Lead Channel Pacing Threshold Pulse Width: 0.4 ms
Lead Channel Pacing Threshold Pulse Width: 0.5 ms
Lead Channel Sensing Intrinsic Amplitude: 1.6 mV
Lead Channel Sensing Intrinsic Amplitude: 12 mV
Lead Channel Setting Pacing Amplitude: 1.75 V
Lead Channel Setting Pacing Amplitude: 2 V
Lead Channel Setting Pacing Amplitude: 2.375
Lead Channel Setting Pacing Pulse Width: 0.4 ms
Lead Channel Setting Pacing Pulse Width: 0.5 ms
Lead Channel Setting Sensing Sensitivity: 2 mV
Pulse Gen Model: 3562
Pulse Gen Serial Number: 9157656

## 2023-12-05 ENCOUNTER — Ambulatory Visit: Payer: Medicare Other | Admitting: Cardiovascular Disease

## 2023-12-19 ENCOUNTER — Ambulatory Visit: Payer: Medicare Other | Attending: Cardiovascular Disease

## 2023-12-19 DIAGNOSIS — Z950 Presence of cardiac pacemaker: Secondary | ICD-10-CM | POA: Diagnosis not present

## 2023-12-19 DIAGNOSIS — I5042 Chronic combined systolic (congestive) and diastolic (congestive) heart failure: Secondary | ICD-10-CM | POA: Diagnosis not present

## 2023-12-23 NOTE — Progress Notes (Signed)
EPIC Encounter for ICM Monitoring  Patient Name: Charles [name] is a 78 y.o. male Date: 12/23/2023 Primary Care Physican: [name], PA-C Primary Cardiologist: Tresa Endo Electrophysiologist: Mealor Bi-V Pacing: 93%  03/30/2023 Office weight: 280 lbs 05/24/2023 Weight:  271 lbs 08/30/2023 Weight: 282 lbs   AT/AF Burden: 3.80% (taking Xarelto)                                                          Transmission results reviewed.    CorVue thoracic impedance suggesting normal fluid levels since 1/1.   Prescribed: Furosemide 40 mg take 1 tablet (40 mg total) by mouth daily as needed Farxiga 50 mg take 1 tablet daily Xarelto 20 mg take 1 tablet at supper daily   Labs: 09/24/2023 Creatinine 1.06, BUN 20, Potassium 3.9, Sodium 137, GFR >60  08/10/2023 Creatinine 1.04, BUN 19, Potassium 3.9, Sodium 137, GFR >60  06/01/2023 Creatinine 1.36, BUN 35, Potassium 4.6, Sodium 136, GFR 54  A complete set of results can be found in Results Review.   Recommendations: No changes.   Follow-up plan: ICM clinic phone appointment on 01/23/2024.   91 day device clinic remote transmission 02/20/2024.   EP/Cardiology Office Visits:   03/12/2024 with Dr Tresa Endo.   01/20/2024 with Dr Nelly Laurence.   3 month ICM trend: 12/19/2023.    12-14 Month ICM trend:     [name], RN 12/23/2023 2:47 PM

## 2024-01-01 ENCOUNTER — Other Ambulatory Visit: Payer: Self-pay | Admitting: Endocrinology

## 2024-01-01 DIAGNOSIS — E782 Mixed hyperlipidemia: Secondary | ICD-10-CM

## 2024-01-09 ENCOUNTER — Other Ambulatory Visit: Payer: Self-pay

## 2024-01-09 DIAGNOSIS — E118 Type 2 diabetes mellitus with unspecified complications: Secondary | ICD-10-CM

## 2024-01-09 MED ORDER — GABAPENTIN 300 MG PO CAPS: 300.0000 mg | ORAL_CAPSULE | Freq: Three times a day (TID) | ORAL | 2 refills | Status: DC

## 2024-01-19 ENCOUNTER — Encounter: Payer: Self-pay | Admitting: Endocrinology

## 2024-01-19 ENCOUNTER — Ambulatory Visit (INDEPENDENT_AMBULATORY_CARE_PROVIDER_SITE_OTHER): Payer: Medicare Other | Admitting: Endocrinology

## 2024-01-19 ENCOUNTER — Other Ambulatory Visit: Payer: Self-pay | Admitting: Endocrinology

## 2024-01-19 VITALS — BP 128/80 | HR 72 | Resp 20 | Ht 69.0 in | Wt 271.6 lb

## 2024-01-19 DIAGNOSIS — E118 Type 2 diabetes mellitus with unspecified complications: Secondary | ICD-10-CM

## 2024-01-19 DIAGNOSIS — Z794 Long term (current) use of insulin: Secondary | ICD-10-CM

## 2024-01-19 LAB — POCT GLYCOSYLATED HEMOGLOBIN (HGB A1C): Hemoglobin A1C: 7 % — AB (ref 4.0–5.6)

## 2024-01-19 MED ORDER — FREESTYLE LIBRE 3 PLUS SENSOR MISC
1.0000 | 3 refills | Status: DC
Start: 2024-01-19 — End: 2024-03-12

## 2024-01-19 MED ORDER — INSULIN ASPART 100 UNIT/ML IJ SOLN: INTRAMUSCULAR | 3 refills | Status: DC

## 2024-01-19 MED ORDER — TRESIBA FLEXTOUCH 100 UNIT/ML ~~LOC~~ SOPN: PEN_INJECTOR | SUBCUTANEOUS | 2 refills | Status: DC

## 2024-01-19 NOTE — Patient Instructions (Signed)
 Increase Tresiba 35 units in am, NovoLog 5 -15 units at  most usually 3 meals a day. Take less of Novolog in case of exercise or increased physical activity. Decrease novolog to 10 units for breakfast.    Stop farxiga due to frequent UTIs.
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
## 2024-01-20 ENCOUNTER — Ambulatory Visit: Payer: Medicare Other | Attending: Cardiovascular Disease | Admitting: Cardiovascular Disease

## 2024-01-20 ENCOUNTER — Encounter: Payer: Self-pay | Admitting: Cardiovascular Disease

## 2024-01-20 VITALS — BP 134/74 | HR 71 | Ht 69.0 in | Wt 275.0 lb

## 2024-01-20 DIAGNOSIS — I255 Ischemic cardiomyopathy: Secondary | ICD-10-CM | POA: Diagnosis not present

## 2024-01-20 DIAGNOSIS — I5042 Chronic combined systolic (congestive) and diastolic (congestive) heart failure: Secondary | ICD-10-CM | POA: Insufficient documentation

## 2024-01-20 DIAGNOSIS — Z950 Presence of cardiac pacemaker: Secondary | ICD-10-CM | POA: Insufficient documentation

## 2024-01-20 DIAGNOSIS — I4819 Other persistent atrial fibrillation: Secondary | ICD-10-CM | POA: Diagnosis present

## 2024-01-20 DIAGNOSIS — I495 Sick sinus syndrome: Secondary | ICD-10-CM | POA: Insufficient documentation

## 2024-01-20 MED ORDER — CARVEDILOL 12.5 MG PO TABS: 12.5000 mg | ORAL_TABLET | Freq: Two times a day (BID) | ORAL | 3 refills | Status: DC

## 2024-01-20 NOTE — Addendum Note (Signed)
 Addended by: Sherle Poe R on: 01/20/2024 03:13 PM   Modules accepted: Orders

## 2024-01-20 NOTE — Progress Notes (Signed)
  Electrophysiology Office Note:    Date:  01/20/2024   ID:  Charles [name], Charles [name] 09/09/1946, MRN [id number]  PCP:  [name], PA-C   [facility] Providers Cardiologist:  Nicki Guadalajara, MD Electrophysiologist:  Maurice Small, MD     Referring MD: [name], PA-C   History of Present Illness:    Charles [name] is a 78 y.o. male with a hx listed below, significant for CAD with multiple PCI, PAF, sick sinus syndrome with pacemaker placement, referred for arrhythmia management.  he has no device related complaints -- no new tenderness, drainage, redness.       EKGs/Labs/Other Studies Reviewed Today:     TTE 3/23 EF 45-50%  EKG:  Last EKG results: today: A-sensed, BiV paced   Recent Labs: 09/24/2023: ALT 23; BUN 20; Creatinine, Ser 1.06; Hemoglobin 15.1; Platelets 196; Potassium 3.9; Sodium 137     Physical Exam:    VS:  There were no vitals taken for this visit.    Wt Readings from Last 3 Encounters:  01/19/24 271 lb 9.6 oz (123.2 kg)  11/14/23 274 lb 9.6 oz (124.6 kg)  11/01/23 281 lb (127.5 kg)     GEN:  Well nourished, well developed in no acute distress CARDIAC: RRR, no murmurs, rubs, gallops RESPIRATORY:  Normal work of breathing MUSCULOSKELETAL: trace edema    ASSESSMENT & PLAN:    Paroxysmal atrial fibrillation:  no episodes in the past 6 months.  Continue carvedilol -- will increase back to 12.5 --  and xarelto 20mg   BiV pacemaker:  normal function. See paceart Upgrade from dual chamber pacemaker in 2020 due to LBBB, reduced EF 93% BiV paced due to PACs with rapid conduction -- increase carverdilol If he has difficulty with hypotension, can switch to metoprolol   Secondary hypercoagulable state: continue xarelto 20  CHF recovered EF: continue entresto 49-51, lasix 40, carvedilol 12.5, farxiga 5mg   Obesity  OSA on CPAP        Medication Adjustments/Labs and Tests Ordered: Current medicines are reviewed at length  with the patient today.  Concerns regarding medicines are outlined above.  Orders Placed This Encounter  Procedures   EKG 12-Lead   No orders of the defined types were placed in this encounter.    Signed, Maurice Small, MD  01/20/2024 2:43 PM    [facility]

## 2024-01-20 NOTE — Patient Instructions (Signed)
 Medication Instructions:  INCREASE Coreg (carvedilol) 12.5 mg twice daily  *If you need a refill on your cardiac medications before your next appointment, please call your pharmacy*   Follow-Up: At [facility], you and your health needs are our priority.  As part of our continuing mission to provide you with exceptional heart care, we have created designated Provider Care Teams.  These Care Teams include your primary Cardiologist (physician) and Advanced Practice Providers (APPs -  Physician Assistants and Nurse Practitioners) who all work together to provide you with the care you need, when you need it.  We recommend signing up for the patient portal called "MyChart".  Sign up information is provided on this After Visit Summary.  MyChart is used to connect with patients for Virtual Visits (Telemedicine).  Patients are able to view lab/test results, encounter notes, upcoming appointments, etc.  Non-urgent messages can be sent to your provider as well.   To learn more about what you can do with MyChart, go to [email or web address].    Your next appointment:   1 year(s)  Provider:   You will see one of the following Advanced Practice Providers on your designated Care Team:   Francis Dowse, Charlott Holler [address] [place], [place] Sherie Don, NP Canary Brim, NP

## 2024-01-23 ENCOUNTER — Ambulatory Visit: Payer: Medicare Other | Attending: Cardiovascular Disease

## 2024-01-23 DIAGNOSIS — Z950 Presence of cardiac pacemaker: Secondary | ICD-10-CM | POA: Diagnosis not present

## 2024-01-23 DIAGNOSIS — I5042 Chronic combined systolic (congestive) and diastolic (congestive) heart failure: Secondary | ICD-10-CM | POA: Diagnosis not present

## 2024-01-24 ENCOUNTER — Telehealth: Payer: Self-pay

## 2024-01-24 NOTE — Progress Notes (Signed)
 EPIC Encounter for ICM Monitoring  Patient Name: Charles [name] is a 78 y.o. male Date: 01/24/2024 Primary Care Physican: [name], PA-C Primary Cardiologist: Tresa Endo Electrophysiologist: Mealor Bi-V Pacing: 93%  03/30/2023 Office weight: 280 lbs 05/24/2023 Weight:  271 lbs 01/20/2024 Office Weight: 275 lbs   AT/AF Burden: 3.8% (taking Xarelto)                                                          Attempted call to patient and unable to reach.  Left detailed message per DPR regarding transmission.  Transmission results reviewed.    CorVue thoracic impedance suggesting possible fluid accumulation starting 2/25.  Also suggested possible fluid accumulation from 1/31-2/4 and 2/16-2/19   Prescribed: Furosemide 40 mg take 1 tablet (40 mg total) by mouth daily as needed   Labs: 09/24/2023 Creatinine 1.06, BUN 20, Potassium 3.9, Sodium 137, GFR >60  08/10/2023 Creatinine 1.04, BUN 19, Potassium 3.9, Sodium 137, GFR >60  06/01/2023 Creatinine 1.36, BUN 35, Potassium 4.6, Sodium 136, GFR 54  A complete set of results can be found in Results Review.   Recommendations: Left voice mail with ICM number and encouraged to call if experiencing any fluid symptoms.   Follow-up plan: ICM clinic phone appointment on 02/27/2024.   91 day device clinic remote transmission 02/20/2024.   EP/Cardiology Office Visits:   03/12/2024 with Dr Tresa Endo.     Copy of ICM check sent to Dr. Nelly Laurence.   3 month ICM trend: 01/23/2024.    12-14 Month ICM trend:     [name], RN 01/24/2024 8:06 AM

## 2024-01-24 NOTE — Telephone Encounter (Signed)
 Pt called and lvm stating he had some questions about his freestyle libre. I called the pt back and he stated that his reader no longer works. He stated this happened last November and now this past February 14th. He said [employer] won't cover another reader, they only cover 1 per year and he is frustrated that he does not currently have a meter. He stated he wears something around his neck to notify him/his wife when he gets low blood sugars at night.   I let him know his options are 1) to call his insurance company to see if they would be willing to cover dexcom   2) use a glucose meter   3) pay for the reader out of pocket.   Pt stated he will call his insurance company and see if they can cover the dexcom for him. I let him know we do have educators in the office that can help him with setting up and understanding how to use the device. He verbalized understanding.

## 2024-01-24 NOTE — Telephone Encounter (Signed)
 Remote ICM transmission received.  Attempted call to patient regarding ICM remote transmission and left detailed message per DPR.  Left ICM phone number and advised to return call for any fluid symptoms or questions. Next ICM remote transmission scheduled 02/27/2024.

## 2024-01-25 NOTE — Telephone Encounter (Signed)
 Pt lvm requesting a call back. Spoke with his wife and she stated per medicare, the pt needs to see Dr.Thapa for an appointment to have notes on why he needs the dexcom and for the dexcom to be ordered.   I let her know I would forward this to the front desk so the pt can be scheduled. She did state she wanted him scheduled as soon as possible due to him needing this CGM for his low blood sugars that occur when he's sleeping.

## 2024-01-26 ENCOUNTER — Other Ambulatory Visit: Payer: Self-pay

## 2024-01-26 ENCOUNTER — Ambulatory Visit: Admitting: Endocrinology

## 2024-01-26 ENCOUNTER — Other Ambulatory Visit (HOSPITAL_COMMUNITY): Payer: Self-pay

## 2024-01-26 ENCOUNTER — Encounter: Payer: Self-pay | Admitting: Endocrinology

## 2024-01-26 ENCOUNTER — Telehealth: Payer: Self-pay | Admitting: Pharmacy Technician

## 2024-01-26 VITALS — BP 136/84 | HR 91 | Ht 69.0 in | Wt 281.0 lb

## 2024-01-26 DIAGNOSIS — Z794 Long term (current) use of insulin: Secondary | ICD-10-CM

## 2024-01-26 DIAGNOSIS — E118 Type 2 diabetes mellitus with unspecified complications: Secondary | ICD-10-CM | POA: Diagnosis not present

## 2024-01-26 MED ORDER — DEXCOM G7 RECEIVER DEVI: 1.0000 | 0 refills | Status: AC

## 2024-01-26 MED ORDER — DEXCOM G7 SENSOR MISC: 1.0000 | 3 refills | Status: DC

## 2024-01-26 MED ORDER — DEXCOM G7 SENSOR MISC: 1.0000 | 0 refills | Status: DC

## 2024-01-26 MED ORDER — DEXCOM G7 RECEIVER DEVI: 1.0000 | 0 refills | Status: DC

## 2024-01-26 MED ORDER — DEXCOM G7 RECEIVER DEVI: 1.0000 | 3 refills | Status: DC

## 2024-01-26 NOTE — Telephone Encounter (Signed)
 Pharmacy Patient Advocate Encounter   Received notification from  RX Response msg  that prior authorization for Dexcom G7 is required/requested.   Insurance verification completed.   The patient is insured through [employer] .   Per test claim: Not covered by part D. May be covered by part B. Attempted to call the pharmacy, but they are closed right now.
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
## 2024-01-26 NOTE — Telephone Encounter (Signed)
 PA request has been Received. New Encounter has been or will be created for follow up. For additional info see Pharmacy Prior Auth telephone encounter from 01/26/24.

## 2024-01-30 NOTE — Progress Notes (Unsigned)
 Assessment/Plan:   1.  Tremor             -as with previous, I didn't recommend medication for tx.  Part of that is because his tremor is fairly mild.  Most of it is because of the fact that many of the tremor drugs would interact with current medications.  Primidone would interact with his Xarelto.  He is already on a beta-blocker, so propranolol would not be indicated.  He cannot have topiramate because of his long history of nephrolithiasis.  Age and neuropathy/ataxia would prevent anticholinergics.  Patient was agreeable to holding on treatment for right now and did not seem particularly upset by this.   2.  Gait instability             -Patient does have physical exam evidence of peripheral neuropathy, likely secondary to diabetes, even though that is well treated now.  Records indicate that this has been difficult to control in the past.  He reported today that his A1c is currently 7 and he is working on controlling it better.  He also reports that back pain has been contributing to his some gait instability and he has been following with [facility] orthopedics for that.  -His wife thinks that wearing the wrong shoes was causing the left foot to turn inward and she was hoping he would sign up for a program to the [employer] store that was about $800 for him to get some orthotics.  I told them that orthotics and diabetic shoes are likely going to be paid for through podiatry, given that he is a diabetic and they can call for a podiatry appointment if they think appropriate.   3.  Visual hallucination  -Really only happening as the patient is first falling off to sleep or first awakening, which can be a normal phenomenon, especially in the aging brain.    -Am concerned that this could be a part of a bigger process that could include memory change.  While he really was able to provide his history quite well and accurately, his MoCA was much lower than I expected today and neurocognitive testing will  be pursued.  -We will check MRI of the brain at [facility].  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  Just want to make sure we are not missing anything.  -I did tell him that I do not recommend the Phenergan that he is taking every day.  -Lab work to include b12, tsh, folate, last A1C was 7.0 Subjective:   Charles [name] was seen today in the movement disorders clinic for neurologic consultation at the request of [name], Cordelia Poche.  The consultation is for the evaluation of visual hallucinations.  Patient states today that visual hallucinations never happen while he is fully awake.  These are happening as he is falling off to sleep or first awakening in the morning.  He reports its generally not a fully formed object.  Patient does take promethazine most mornings for nausea.  He took 2 this morning, in fact.  He reports that memory has been going downhill.  He has some trouble with word finding - "I call them my senior moment."  This is worse when "his blood sugar is out of whack" per wife.  This happens frequently.  Wife  has always done the monthly finances.  He distributes his own medications.  He drives in a limited fashion.  He has had a few moments where he quickly forgets where he is and then he remembers but then he will remember.    I saw the patient about 4 years ago for gait instability and tremor.  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  Medication was not recommended, as he was already on Xarelto, which interacted with primidone, and was already on a beta-blocker as well.  He reports tremor is getting worse and more trouble keeping the food on the utensils.  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  His last fall was 2-3 years ago and he fell down the stairs and hit head and was "checked out" at [facility] and released.      ALLERGIES:   Allergies  Allergen Reactions   Black Pepper [Piper] Other (See Comments)    Irritates back of throat   Chocolate Hives, Shortness Of Breath and Swelling    Dark chocolate   Codeine Itching and Swelling   Oxytetracycline Other (See Comments)    Flushing in sunlight   Statins Other (See Comments)    Mental changes, muscle aches   Flavoring Agent Other (See Comments)   Latex Itching and Other (See Comments)    Sensitive skin   Other Other (See Comments)    Opioids mess up his Stomach   Tape Rash and Other (See Comments)    SKIN IS VERY SENSITIVE!!    CURRENT MEDICATIONS:  Current Outpatient Medications  Medication Instructions   acetaminophen (TYLENOL) 1,300 mg, Every 8 hours PRN   alfuzosin (UROXATRAL) 10 mg, Every evening   aspirin EC 81 mg, Oral, Daily   carvedilol (COREG) 12.5 mg, Oral, 2 times daily   cephALEXin (KEFLEX) 500 mg, Oral, 2 times daily   COLLAGEN PO 2,500 mg, Daily   Continuous Blood Gluc Receiver (FREESTYLE LIBRE 14 DAY READER) DEVI USE TO MONITOR BLOOD SUGAR   Continuous Glucose Receiver (DEXCOM G7 RECEIVER) DEVI 1 Device, Does not apply, Continuous   Continuous Glucose Sensor (DEXCOM G7 SENSOR) MISC 1 Device, Does not apply, Continuous, Change every 10 days.   Continuous Glucose Sensor (FREESTYLE LIBRE 3 PLUS SENSOR) MISC 1 each, Does not apply, Continuous, Change every 15 days.   cyanocobalamin 1,000 mcg, Daily   docusate sodium (COLACE) 100 mg, Oral, Every 12 hours, As needed   ezetimibe (ZETIA) 10 mg, Oral, Daily   fluticasone (FLONASE) 50 MCG/ACT nasal spray 1 spray, Daily PRN   furosemide (LASIX) 40 mg, Oral, Daily PRN   gabapentin (NEURONTIN) 300 mg, Oral, 3 times daily, TAKE 1 CAPSULE BY MOUTH THREE TIMES A DAY   Glucagon (GVOKE HYPOPEN 2-PACK) 1 MG/0.2ML SOAJ Inject to  treat severe low blood sugars   insulin aspart (NOVOLOG) 100 UNIT/ML injection INJECT 5-15 UNITS UNDER THE SKIN THREE TIMES DAILY BEFORE MEALS.   insulin degludec (TRESIBA FLEXTOUCH) 100 UNIT/ML FlexTouch Pen Inject 32 units daily in morning   Insulin Pen Needle (BD ULTRA-FINE PEN NEEDLES) 29G X 12.7MM MISC USE DAILY TO INJECT TRESIBA INSULIN   Insulin Syringe-Needle U-100 (BD INSULIN SYRINGE U/F) 30G X 1/2" 0.5 ML MISC USE TO INJECT INSULIN 3 TIMES DAILY   latanoprost (XALATAN) 0.005 % ophthalmic solution 1 drop, Daily at bedtime   meclizine (ANTIVERT) 25 mg, 3 times daily PRN   nitroGLYCERIN (NITROSTAT) 0.4  MG SL tablet PLACE 1 TABLET UNDER THE TONGUE EVERY 5 MINUTES AS NEEDED FOR CHEST PAIN   ondansetron (ZOFRAN) 4 mg, 3 times daily PRN   pantoprazole (PROTONIX) 20 mg, Oral, Daily   polyethylene glycol (MIRALAX / GLYCOLAX) 17 g, Oral, 2 times daily PRN   promethazine (PHENERGAN) 25 MG tablet 1 tablet, Every 8 hours PRN   sacubitril-valsartan (ENTRESTO) 24-26 MG 1 tablet, Oral, 2 times daily   silodosin (RAPAFLO) 8 mg, Daily   triamcinolone cream (KENALOG) 0.1 % 1 application , 2 times daily PRN   Vibegron (GEMTESA PO) Take by mouth.   XARELTO 20 MG TABS tablet TAKE 1 TABLET BY MOUTH DAILY WITH SUPPER   zinc gluconate 50 mg, Daily    Objective:   PHYSICAL EXAMINATION:    VITALS:   Vitals:   01/31/24 1319  BP: 136/62  Pulse: 70  SpO2: 96%  Weight: 274 lb 3.2 oz (124.4 kg)  Height: 5\' 9"  (1.753 m)    GEN:  The patient appears stated age and is in NAD. HEENT:  Normocephalic, atraumatic.  The mucous membranes are moist. The superficial temporal arteries are without ropiness or tenderness. CV:  RRR Lungs:  CTAB Neck/HEME:  There are no carotid bruits bilaterally.  Neurological examination:  Orientation: The patient is alert and oriented x3.     01/31/2024    2:00 PM  Montreal Cognitive Assessment   Visuospatial/ Executive (0/5) 0  Naming (0/3) 3  Attention: Read list of  digits (0/2) 1  Attention: Read list of letters (0/1) 1  Attention: Serial 7 subtraction starting at 100 (0/3) 0  Language: Repeat phrase (0/2) 1  Language : Fluency (0/1) 0  Abstraction (0/2) 0  Delayed Recall (0/5) 0  Orientation (0/6) 6  Total 12  Adjusted Score (based on education) 13    Cranial nerves: There is good facial symmetry.  Extraocular muscles are intact. The visual fields are full to confrontational testing. The speech is fluent and clear. Soft palate rises symmetrically and there is no tongue deviation. Hearing is intact to conversational tone. Sensation: Sensation is intact to light touch throughout (facial, trunk, extremities). Vibration is absent at the bilateral big toe and overall decreased distally. There is no extinction with double simultaneous stimulation.  Motor: Strength is 5/5 in the bilateral upper and lower extremities.   Shoulder shrug is equal and symmetric.  There is no pronator drift. Deep tendon reflexes: Deep tendon reflexes are 0-1/4 at the bilateral biceps, triceps, brachioradialis, patella and achilles. Plantar responses are downgoing bilaterally.   Movement examination: Tone: There is normal tone in the bilateral upper extremities.  The tone in the lower extremities is normal.  Abnormal movements: no rest tremor.  There is mild postural tremor..  He has mild trouble with archimedes spirals  Coordination:  There is no decremation with RAM's, with any form of RAMS, including alternating supination and pronation of the forearm, hand opening and closing, finger taps, heel taps and toe taps. Gait and Station: The patient has no difficulty arising out of a deep-seated chair without the use of the hands. The patient's stride length is good.   Gait is antalgic.  He drags the L leg (he attributes to the back - follows at [facility])  I have reviewed and interpreted the following labs independently   Chemistry      Component Value Date/Time   NA 137 09/24/2023  2341   NA 137 06/01/2023 0821   K 3.9 09/24/2023 2341  CL 107 09/24/2023 2341   CO2 23 09/24/2023 2341   BUN 20 09/24/2023 2341   BUN 31 (H) 06/01/2023 0821   CREATININE 1.06 09/24/2023 2341   CREATININE 1.30 (H) 10/06/2016 1444      Component Value Date/Time   CALCIUM 8.6 (L) 09/24/2023 2341   ALKPHOS 61 09/24/2023 2341   AST 17 09/24/2023 2341   ALT 23 09/24/2023 2341   BILITOT 0.8 09/24/2023 2341   BILITOT 0.7 06/01/2023 0821      Lab Results  Component Value Date   TSH 1.930 03/12/2022   Lab Results  Component Value Date   WBC 7.2 09/24/2023   HGB 15.1 09/24/2023   HCT 46.7 09/24/2023   MCV 95.5 09/24/2023   PLT 196 09/24/2023      Total time spent on today's visit was 68 minutes, including both face-to-face time and nonface-to-face time.  Time included that spent on review of records (prior notes available to me/labs/imaging if pertinent), discussing treatment and goals, answering patient's questions and coordinating care.  Cc:  [name], PA-C

## 2024-01-31 ENCOUNTER — Ambulatory Visit (INDEPENDENT_AMBULATORY_CARE_PROVIDER_SITE_OTHER): Payer: Medicare Other | Admitting: Neurology

## 2024-01-31 ENCOUNTER — Other Ambulatory Visit

## 2024-01-31 VITALS — BP 136/62 | HR 70 | Ht 69.0 in | Wt 274.2 lb

## 2024-01-31 DIAGNOSIS — R413 Other amnesia: Secondary | ICD-10-CM

## 2024-01-31 DIAGNOSIS — R6889 Other general symptoms and signs: Secondary | ICD-10-CM | POA: Diagnosis not present

## 2024-01-31 DIAGNOSIS — Z5181 Encounter for therapeutic drug level monitoring: Secondary | ICD-10-CM

## 2024-01-31 DIAGNOSIS — R2681 Unsteadiness on feet: Secondary | ICD-10-CM

## 2024-01-31 DIAGNOSIS — R441 Visual hallucinations: Secondary | ICD-10-CM

## 2024-01-31 DIAGNOSIS — R251 Tremor, unspecified: Secondary | ICD-10-CM | POA: Diagnosis not present

## 2024-01-31 NOTE — Patient Instructions (Addendum)
 Your provider has requested that you have labwork completed today. The lab is located on the Second floor at [address], within the [facility] office. When you get off the elevator, turn right and go in the [facility] [address]; the first brown door on the left.  Tell the ladies behind the desk that you are there for lab work. If you are not called within 15 minutes please check with the front desk.   Once you complete your labs you are free to go. You will receive a call or message via MyChart with your lab results.     You have been referred for a neurocognitive evaluation (i.e., evaluation of memory and thinking abilities). Please bring someone with you to this appointment if possible, as it is helpful for the neuropsychologist to hear from both you and another adult who knows you well. Please bring eyeglasses and hearing aids if you wear them and take any medications as you normally would. Please fully abstain from all alcohol, marijuana, or other substances prior to your appointment.   The evaluation will take approximately 2-3 hours and has two parts:   The first part is a clinical interview with the neuropsychologist, Dr. Milbert Coulter or Dr. Robbie Lis. During the interview, the neuropsychologist will speak with you and the individual you brought to the appointment.    The second part of the evaluation is testing with the doctor's technician, aka psychometrician, Annabelle Harman or [employer]. During the testing, the technician will ask you to remember different types of material, solve problems, and answer some questionnaires. Your family member will not be present for this portion of the evaluation.   Please note: We have to reserve several hours of the neuropsychologist's time and the psychometrician's time for your evaluation appointment. As such, there is a No-Show fee of $100. If you are unable to attend any of your appointments, please contact our office as soon as possible to reschedule.

## 2024-02-01 ENCOUNTER — Encounter: Payer: Self-pay | Admitting: Internal Medicine

## 2024-02-01 LAB — B12 AND FOLATE PANEL
Folate: 23.3 ng/mL
Vitamin B-12: 670 pg/mL (ref 200–1100)

## 2024-02-01 LAB — TSH: TSH: 1.39 m[IU]/L (ref 0.40–4.50)

## 2024-02-01 LAB — HM DIABETES EYE EXAM

## 2024-02-01 LAB — RPR: RPR Ser Ql: NONREACTIVE

## 2024-02-03 ENCOUNTER — Other Ambulatory Visit: Payer: Self-pay

## 2024-02-03 DIAGNOSIS — E118 Type 2 diabetes mellitus with unspecified complications: Secondary | ICD-10-CM

## 2024-02-03 MED ORDER — BD PEN NEEDLE ORIGINAL U/F 29G X 12.7MM MISC: 3 refills | Status: AC

## 2024-02-03 MED ORDER — BD INSULIN SYRINGE U/F 30G X 1/2" 0.5 ML MISC: 3 refills | Status: AC

## 2024-02-09 ENCOUNTER — Telehealth: Payer: Self-pay

## 2024-02-09 ENCOUNTER — Telehealth: Payer: Self-pay | Admitting: Neurology

## 2024-02-09 NOTE — Telephone Encounter (Signed)
 Spouse requested chart notes be sent to Medicare CGM sent on 3/18, left VM making patient aware.

## 2024-02-09 NOTE — Telephone Encounter (Signed)
 Spoke with patient's spouse explained to her that paperwork was faxed several times with the last being today with a successful confirmation at 10:53am. RN also made spouse aware that a copy would be left at front desk for patient if they wish to pick it up. Front desk made aware it was left in folder in front.

## 2024-02-09 NOTE — Telephone Encounter (Signed)
 Patients wife called back. She states that she needs QuiQui to speak with her in the next 30 minutes. I let her know she was with patients and unable to come to phone. She states that we were supposed to fax something for her-which we did and have confirmation- and I explained that we did and had confirmation but also that Hollace Hayward was going to refax it as I had heard her on the phone with her earlier. She was ranting that she had been trying to get this taken care of for 3 days now and it has not happened. She wanted to speak to [place] who was unavailable at the time. She demanded to speak to Quiqui within the next 30 minutes since we have failed her requests or else she was going to take it up with the provider.

## 2024-02-09 NOTE — Telephone Encounter (Signed)
 Pt.wife MRI sched 02/11/24 1:30pm wants to speak to doctor for Approval under insurance for MRI and what would be the copay

## 2024-02-09 NOTE — Telephone Encounter (Signed)
 Patient states that she spoke to you to ask if you could send chart notes to [facility] so they can get his meter. States she needs a call once its done.

## 2024-02-10 NOTE — Telephone Encounter (Signed)
 Called and spoke to patient about s scheduled for tomorrow and that they now know the copay amount

## 2024-02-13 ENCOUNTER — Other Ambulatory Visit: Payer: Self-pay

## 2024-02-13 ENCOUNTER — Telehealth: Payer: Self-pay | Admitting: Neurology

## 2024-02-13 DIAGNOSIS — R441 Visual hallucinations: Secondary | ICD-10-CM

## 2024-02-13 DIAGNOSIS — R413 Other amnesia: Secondary | ICD-10-CM

## 2024-02-13 DIAGNOSIS — R2681 Unsteadiness on feet: Secondary | ICD-10-CM

## 2024-02-13 DIAGNOSIS — R6889 Other general symptoms and signs: Secondary | ICD-10-CM

## 2024-02-13 DIAGNOSIS — R251 Tremor, unspecified: Secondary | ICD-10-CM

## 2024-02-13 NOTE — Telephone Encounter (Signed)
 Called and left message I have sent in a new order. I apologized I didn't know the way that they do things at this hospital and no one screened patient for a pacemaker before the day of the MRI and patient has a card that he is compatible.

## 2024-02-13 NOTE — Telephone Encounter (Signed)
 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  The patient received a Physicist, medical model [id number] (serial number [id number]) lead, [employer] MP model [id number] (serial  Number [id number]) biventricular pacemaker

## 2024-02-13 NOTE — Telephone Encounter (Signed)
 Patient wife called and states that patient was to have a MRI done this weekend  but could not have it done  due to his pacemaker. She has already paid the Deposit at [facility] for the MRI so now she has to work on getting the money back. She needs to have a new order sent so that he can have it done  before his appt with Tat in April.    She want to speak to someone about this  please call

## 2024-02-14 ENCOUNTER — Telehealth: Payer: Self-pay

## 2024-02-14 NOTE — Telephone Encounter (Signed)
 This patient was originally scheduled to go to [facility] for an MRI before we were notified that he showed up and was turned away due to his pacemaker. I was able to send to [facility] and get scheduled today for 03/23/24. The patient wife wants to know if she needs to Reschedule the appointments with Dr. Milbert Coulter for 03/19/24? Do to not having the MRI completed yet?

## 2024-02-15 NOTE — Telephone Encounter (Signed)
 Called patiens wife Bonita Quin and let her know that no need to R/S

## 2024-02-16 ENCOUNTER — Other Ambulatory Visit: Payer: Self-pay

## 2024-02-16 ENCOUNTER — Emergency Department (HOSPITAL_COMMUNITY)
Admission: EM | Admit: 2024-02-16 | Discharge: 2024-02-16 | Disposition: A | Discharge status: Home / Self Care | Attending: Emergency Medicine | Admitting: Emergency Medicine

## 2024-02-16 ENCOUNTER — Emergency Department (HOSPITAL_COMMUNITY)

## 2024-02-16 ENCOUNTER — Telehealth: Payer: Self-pay | Admitting: Cardiovascular Disease

## 2024-02-16 DIAGNOSIS — Z950 Presence of cardiac pacemaker: Secondary | ICD-10-CM | POA: Insufficient documentation

## 2024-02-16 DIAGNOSIS — I480 Paroxysmal atrial fibrillation: Secondary | ICD-10-CM | POA: Insufficient documentation

## 2024-02-16 DIAGNOSIS — E119 Type 2 diabetes mellitus without complications: Secondary | ICD-10-CM | POA: Insufficient documentation

## 2024-02-16 DIAGNOSIS — R6889 Other general symptoms and signs: Secondary | ICD-10-CM

## 2024-02-16 DIAGNOSIS — N390 Urinary tract infection, site not specified: Secondary | ICD-10-CM | POA: Diagnosis not present

## 2024-02-16 DIAGNOSIS — R5381 Other malaise: Secondary | ICD-10-CM | POA: Diagnosis not present

## 2024-02-16 DIAGNOSIS — Z91148 Patient's other noncompliance with medication regimen for other reason: Secondary | ICD-10-CM | POA: Insufficient documentation

## 2024-02-16 DIAGNOSIS — I2510 Atherosclerotic heart disease of native coronary artery without angina pectoris: Secondary | ICD-10-CM | POA: Diagnosis not present

## 2024-02-16 DIAGNOSIS — I509 Heart failure, unspecified: Secondary | ICD-10-CM | POA: Insufficient documentation

## 2024-02-16 DIAGNOSIS — Z951 Presence of aortocoronary bypass graft: Secondary | ICD-10-CM | POA: Diagnosis not present

## 2024-02-16 DIAGNOSIS — R079 Chest pain, unspecified: Secondary | ICD-10-CM | POA: Diagnosis not present

## 2024-02-16 DIAGNOSIS — I110 Hypertensive heart disease with heart failure: Secondary | ICD-10-CM | POA: Diagnosis not present

## 2024-02-16 DIAGNOSIS — K5900 Constipation, unspecified: Secondary | ICD-10-CM | POA: Diagnosis present

## 2024-02-16 DIAGNOSIS — R0602 Shortness of breath: Secondary | ICD-10-CM | POA: Insufficient documentation

## 2024-02-16 DIAGNOSIS — Z794 Long term (current) use of insulin: Secondary | ICD-10-CM | POA: Insufficient documentation

## 2024-02-16 LAB — CBC
HCT: 43.7 % (ref 39.0–52.0)
Hemoglobin: 14.2 g/dL (ref 13.0–17.0)
MCH: 30.5 pg (ref 26.0–34.0)
MCHC: 32.5 g/dL (ref 30.0–36.0)
MCV: 94 fL (ref 80.0–100.0)
Platelets: 196 10*3/uL (ref 150–400)
RBC: 4.65 MIL/uL (ref 4.22–5.81)
RDW: 13.5 % (ref 11.5–15.5)
WBC: 6 10*3/uL (ref 4.0–10.5)
nRBC: 0 % (ref 0.0–0.2)

## 2024-02-16 LAB — URINALYSIS, ROUTINE W REFLEX MICROSCOPIC
Bilirubin Urine: NEGATIVE
Glucose, UA: NEGATIVE mg/dL
Hgb urine dipstick: NEGATIVE
Ketones, ur: NEGATIVE mg/dL
Leukocytes,Ua: NEGATIVE
Nitrite: NEGATIVE
Protein, ur: NEGATIVE mg/dL
Specific Gravity, Urine: 1.015 (ref 1.005–1.030)
pH: 7 (ref 5.0–8.0)

## 2024-02-16 LAB — RESP PANEL BY RT-PCR (RSV, FLU A&B, COVID)  RVPGX2
Influenza A by PCR: NEGATIVE
Influenza B by PCR: NEGATIVE
Resp Syncytial Virus by PCR: NEGATIVE
SARS Coronavirus 2 by RT PCR: NEGATIVE

## 2024-02-16 LAB — BASIC METABOLIC PANEL WITH GFR
Anion gap: 10 (ref 5–15)
BUN: 21 mg/dL (ref 8–23)
CO2: 24 mmol/L (ref 22–32)
Calcium: 9 mg/dL (ref 8.9–10.3)
Chloride: 103 mmol/L (ref 98–111)
Creatinine, Ser: 1.14 mg/dL (ref 0.61–1.24)
GFR, Estimated: >60 mL/min (ref >60)
Glucose, Bld: 165 mg/dL — ABNORMAL HIGH (ref 70–99)
Potassium: 4.4 mmol/L (ref 3.5–5.1)
Sodium: 137 mmol/L (ref 135–145)

## 2024-02-16 LAB — TROPONIN I (HIGH SENSITIVITY)
Troponin I (High Sensitivity): 17 ng/L (ref <18)
Troponin I (High Sensitivity): 17 ng/L (ref <18)

## 2024-02-16 LAB — BRAIN NATRIURETIC PEPTIDE: B Natriuretic Peptide: 407.2 pg/mL — ABNORMAL HIGH (ref 0.0–100.0)

## 2024-02-16 LAB — CBG MONITORING, ED
Glucose-Capillary: 175 mg/dL — ABNORMAL HIGH (ref 70–99)
Glucose-Capillary: 136 mg/dL — ABNORMAL HIGH (ref 70–99)

## 2024-02-16 MED ORDER — IOHEXOL 350 MG/ML SOLN
75.0000 mL | Freq: Once | INTRAVENOUS | Status: AC | PRN
  Administered 2024-02-16: 75 mL via INTRAVENOUS

## 2024-02-16 NOTE — Telephone Encounter (Signed)
 Patient identification verified by 2 forms. Charles Rail, RN    Received call from patient  Patient states:   -BP this morning: 174/09   -BP this morning: 184/101  -BP at time of call : 174/101 Hr: 72   -Experiencing: dizziness, lightheadedness, tightness in chest   -currently experiencing symptoms at time of call   -symptoms started 2 nights ago, felt "off"  Patient denies:   -headaches   -visual changes  Advised patient to present to ED for evaluation  Patient agrees Patient has no further questions at this time

## 2024-02-16 NOTE — ED Notes (Signed)
 St. Jude device  interrogation done at this time

## 2024-02-16 NOTE — ED Notes (Addendum)
 Just got off the phone with the St. Jude device and they said pt has not had any events and everything looks good with the device. Provider made aware of of phone call.

## 2024-02-16 NOTE — ED Triage Notes (Signed)
 Pt. Stated, Charles [name] had not feeling good for 2 days. He has missed a few medications. Ive also had a UTI .  Im just feeling bad mostly with SOB. Im also a diabetic 2  and I just took 4 Glucose tablets. Missed taking Carveoilol, and Entresto/ .  Pt has readings of his BP which is high.

## 2024-02-16 NOTE — ED Notes (Signed)
 Pt cleaned and changed into dry clothing. Complete bed changed

## 2024-02-16 NOTE — Telephone Encounter (Signed)
 Pt c/o BP issue: STAT if pt c/o blurred vision, one-sided weakness or slurred speech.  STAT if BP is GREATER than 180/120 TODAY.  STAT if BP is LESS than 90/60 and SYMPTOMATIC TODAY  1. What is your BP concern? Extremely high BP  2. Have you taken any BP medication today? yes   3. What are your last 5 BP readings?174/109; 184/101;   4. Are you having any other symptoms (ex. Dizziness, headache, blurred vision, passed out)? Dizziness, lightheadedness, tightness in chest

## 2024-02-16 NOTE — Discharge Instructions (Addendum)
 You are seen today in the ED for not feeling well.  This was in the setting of missing your nighttime medications for the last 2 nights.  Your labs were reassuring.  Your chest x-ray and the scan we did of your chest to look for clots were reassuring.  We analyzed your pacemaker. There were no problems.

## 2024-02-16 NOTE — ED Provider Notes (Signed)
 [place] EMERGENCY DEPARTMENT AT [facility] Provider Note   CSN: [id number] Arrival date & time: 02/16/24  1301     History  Chief Complaint  Patient presents with   Shortness of Breath   feeling bad    Charles [name] is a 78 y.o. male presents due to not feeling well as of this morning.  Patient reports that he has missed his nighttime medications the last 2 nights.  This morning, he initially felt like he had to have a bowel movement, however he was unable to.  He opted for evaluation in the ED.  Patient denies fevers and chills, no sick contacts.  Denies nausea, vomiting, diarrhea.  Denies dysuria.  Denies cough.  Denies chest pain.  Denies shortness of breath  Reports that patient has a history of chronic UTIs and is currently on antibiotics for UTI.  PMHx significant for HTN, HLD, paroxysmal A-fib, DM type II, CAD with multiple PCI, sick sinus syndrome with pacemaker placement, s/p CABG x 4, CHF  Shortness of Breath    Home Medications Prior to Admission medications   Medication Sig Start Date End Date Taking? Authorizing Provider  acetaminophen (TYLENOL) 650 MG CR tablet Take 1,300 mg by mouth every 8 (eight) hours as needed (Arthritis).    [provider]  alfuzosin (UROXATRAL) 10 MG 24 hr tablet Take 10 mg by mouth every evening.     [provider]  aspirin EC 81 MG EC tablet Take 1 tablet (81 mg total) by mouth daily. 09/21/16   Gold, Wayne E, PA-C  carvedilol (COREG) 12.5 MG tablet Take 1 tablet (12.5 mg total) by mouth 2 (two) times daily. 01/20/24 04/19/24  Mealor, Roberts Gaudy, MD  cephALEXin (KEFLEX) 500 MG capsule Take 1 capsule (500 mg total) by mouth 2 (two) times daily. 09/25/23   Ward, Layla Maw, DO  COLLAGEN PO Take 2,500 mg by mouth daily. With vitamin C    [provider]  Continuous Blood Gluc Receiver (FREESTYLE LIBRE 14 DAY READER) DEVI USE TO MONITOR BLOOD SUGAR Patient not taking: Reported on 01/26/2024 12/01/20   Reather Littler, MD  Continuous Glucose Receiver (DEXCOM G7 RECEIVER) DEVI 1 Device by Does not apply route continuous. 01/26/24   Thapa, [place], MD  Continuous Glucose Sensor (DEXCOM G7 SENSOR) MISC 1 Device by Does not apply route continuous. Change every 10 days. 01/26/24   Thapa, [place], MD  Continuous Glucose Sensor (FREESTYLE LIBRE 3 PLUS SENSOR) MISC 1 each by Does not apply route continuous. Change every 15 days. 01/19/24   Thapa, [place], MD  cyanocobalamin 1000 MCG tablet Take 1,000 mcg by mouth daily.    [provider]  docusate sodium (COLACE) 100 MG capsule TAKE 1 CAPSULE (100 MG TOTAL) BY MOUTH EVERY 12 (TWELVE) HOURS. AS NEEDED 10/28/23   Iva Boop, MD  ezetimibe (ZETIA) 10 MG tablet TAKE 1 TABLET BY MOUTH EVERY DAY 01/02/24   Thapa, [place], MD  fluticasone (FLONASE) 50 MCG/ACT nasal spray Place 1 spray into both nostrils daily as needed for allergies or rhinitis.    [provider]  furosemide (LASIX) 40 MG tablet Take 1 tablet (40 mg total) by mouth daily as needed for fluid or edema. 07/08/23   Lennette Bihari, MD  gabapentin (NEURONTIN) 300 MG capsule Take 1 capsule (300 mg total) by mouth 3 (three) times daily. TAKE 1 CAPSULE BY MOUTH THREE TIMES A DAY Patient not taking: Reported on 01/31/2024 01/09/24   Thapa, [place], MD  Glucagon (GVOKE HYPOPEN 2-PACK) 1 MG/0.2ML SOAJ Inject to treat severe low blood sugars 06/30/22   Reather Littler, MD  insulin aspart (NOVOLOG) 100 UNIT/ML injection INJECT 5-15 UNITS UNDER THE SKIN THREE TIMES DAILY BEFORE MEALS. 01/19/24   Thapa, [place], MD  insulin degludec (TRESIBA FLEXTOUCH) 100 UNIT/ML FlexTouch Pen Inject 32 units daily in morning 01/19/24   Thapa, [place], MD  Insulin Pen Needle (BD ULTRA-FINE PEN NEEDLES) 29G X 12.7MM MISC USE DAILY TO INJECT TRESIBA INSULIN 02/03/24   Thapa, [place], MD  Insulin Syringe-Needle U-100 (BD INSULIN SYRINGE U/F) 30G X 1/2" 0.5 ML MISC USE TO INJECT INSULIN 3 TIMES DAILY 02/03/24   Thapa, [place], MD  latanoprost (XALATAN)  0.005 % ophthalmic solution Place 1 drop into both eyes at bedtime. 08/17/22   [provider]  meclizine (ANTIVERT) 25 MG tablet Take 25 mg by mouth 3 (three) times daily as needed (vertigo). 01/01/22   [provider]  nitroGLYCERIN (NITROSTAT) 0.4 MG SL tablet PLACE 1 TABLET UNDER THE TONGUE EVERY 5 MINUTES AS NEEDED FOR CHEST PAIN 05/10/22   Lennette Bihari, MD  ondansetron (ZOFRAN) 4 MG tablet Take 4 mg by mouth 3 (three) times daily as needed. 01/17/24   [provider]  pantoprazole (PROTONIX) 20 MG tablet Take 1 tablet (20 mg total) by mouth daily for 14 days. 06/02/23 06/16/23  Mesner, Barbara Cower, MD  polyethylene glycol (MIRALAX / GLYCOLAX) 17 g packet Take 17 g by mouth 2 (two) times daily as needed for severe constipation. 06/02/23   Mesner, Barbara Cower, MD  promethazine (PHENERGAN) 25 MG tablet Take 1 tablet by mouth every 8 (eight) hours as needed for nausea. 06/25/19   [provider]  sacubitril-valsartan (ENTRESTO) 24-26 MG Take 1 tablet by mouth 2 (two) times daily. 10/05/23   Lennette Bihari, MD  silodosin (RAPAFLO) 8 MG CAPS capsule Take 8 mg by mouth daily. 09/21/23   [provider]  triamcinolone cream (KENALOG) 0.1 % Apply 1 application. topically 2 (two) times daily as needed (rash). 12/07/21   [provider]  Vibegron (GEMTESA PO) Take by mouth.    [provider]  XARELTO 20 MG TABS tablet TAKE 1 TABLET BY MOUTH DAILY WITH SUPPER 10/03/23   Lennette Bihari, MD  zinc gluconate 50 MG tablet Take 50 mg by mouth daily.    [provider]      Allergies    Black pepper [piper], Chocolate, Codeine, Oxytetracycline, Statins, Flavoring agent, Latex, Other, and Tape    Review of Systems   Review of Systems  Respiratory:  Positive for shortness of breath.     Physical Exam Updated Vital Signs BP (!) 150/90   Pulse 78   Temp 98 F (36.7 C)   Resp 14   Ht 5\' 8"  (1.727 m)   Wt 122.5 kg   SpO2 96%   BMI 41.05 kg/m   Physical Exam Vitals and nursing note reviewed.  Constitutional:      General: He is not in acute distress.    Appearance: He is not ill-appearing.  HENT:     Head: Normocephalic and atraumatic.     Mouth/Throat:     Lips: Pink. No lesions.     Pharynx: Oropharynx is clear. Uvula midline.  Eyes:     Extraocular Movements: Extraocular movements intact.     Conjunctiva/sclera: Conjunctivae normal.     Pupils: Pupils are equal, round, and reactive to light.  Cardiovascular:     Rate and Rhythm: Normal  rate and regular rhythm.     Pulses:          Radial pulses are 2+ on the right side and 2+ on the left side.  Pulmonary:     Effort: Pulmonary effort is normal.     Breath sounds: Normal breath sounds.  Abdominal:     Palpations: Abdomen is soft.     Tenderness: There is no abdominal tenderness.  Musculoskeletal:     Cervical back: Normal range of motion.     Right lower leg: 1+ Pitting Edema present.     Left lower leg: 1+ Pitting Edema present.  Skin:    General: Skin is warm and dry.     Capillary Refill: Capillary refill takes less than 2 seconds.  Neurological:     Mental Status: He is alert and oriented to person, place, and time.     Cranial Nerves: No facial asymmetry.     Sensory: Sensation is intact.     Motor: Motor function is intact.     Coordination: Coordination is intact.  Psychiatric:        Behavior: Behavior is cooperative.     ED Results / Procedures / Treatments   Labs (all labs ordered are listed, but only abnormal results are displayed) Labs Reviewed  BASIC METABOLIC PANEL WITH GFR - Abnormal; Notable for the following components:      Result Value   Glucose, Bld 165 (*)    All other components within normal limits  BRAIN NATRIURETIC PEPTIDE - Abnormal; Notable for the following components:   B Natriuretic Peptide 407.2 (*)    All other components within normal limits  CBG MONITORING, ED - Abnormal; Notable for the following components:    Glucose-Capillary 175 (*)    All other components within normal limits  CBG MONITORING, ED - Abnormal; Notable for the following components:   Glucose-Capillary 136 (*)    All other components within normal limits  RESP PANEL BY RT-PCR (RSV, FLU A&B, COVID)  RVPGX2  CBC  URINALYSIS, ROUTINE W REFLEX MICROSCOPIC  TROPONIN I (HIGH SENSITIVITY)  TROPONIN I (HIGH SENSITIVITY)    EKG EKG Interpretation Date/Time:  Thursday February 16 2024 17:59:53 EDT Ventricular Rate:  71 PR Interval:    QRS Duration:  154 QT Interval:  447 QTC Calculation: 483 R Axis:   153  Text Interpretation: AV PACED RHYTHM Right bundle branch block Lateral infarct, age indeterminate Confirmed by Alona Bene [phone]) on 02/16/2024 6:27:00 PM  Radiology CT Angio Chest PE W and/or Wo Contrast Result Date: 02/16/2024 CLINICAL DATA:  Concern for pulmonary embolus. Shortness of breath. Unwell feeling. EXAM: CT ANGIOGRAPHY CHEST WITH CONTRAST TECHNIQUE: Multidetector CT imaging of the chest was performed using the standard protocol during bolus administration of intravenous contrast. Multiplanar CT image reconstructions and MIPs were obtained to evaluate the vascular anatomy. RADIATION DOSE REDUCTION: This exam was performed according to the departmental dose-optimization program which includes automated exposure control, adjustment of the mA and/or kV according to patient size and/or use of iterative reconstruction technique. CONTRAST:  75mL OMNIPAQUE IOHEXOL 350 MG/ML SOLN COMPARISON:  Chest radiograph 02/16/2024. FINDINGS: Cardiovascular: Technically adequate study with good opacification of the central and segmental pulmonary arteries. Mild motion artifact. No focal filling defects are demonstrated in the pulmonary arteries. No evidence of significant pulmonary embolus. Cardiac enlargement. No pericardial effusions. Postoperative changes consistent with coronary bypass. Normal caliber thoracic aorta. Calcification in the aorta  and coronary arteries. Mediastinum/Nodes: Esophagus is decompressed. Thyroid gland is unremarkable.  No mass or lymphadenopathy. Lungs/Pleura: Motion artifact limits evaluation. No airspace disease or consolidation suggested. No pleural effusion or pneumothorax. Upper Abdomen: Surgical absence of the gallbladder. Calcified granulomas in the liver and spleen. Subcentimeter low-attenuation lesions in the liver are too small to characterize but likely represent small cysts or hemangiomas. Musculoskeletal: Degenerative changes in the spine. Sternotomy wires. No acute bony abnormalities. Review of the MIP images confirms the above findings. IMPRESSION: 1. No evidence of significant pulmonary embolus. 2. Cardiac enlargement. 3. Aortic atherosclerosis. 4. No evidence of active pulmonary disease. Electronically Signed   By: Burman Nieves M.D.   On: 02/16/2024 19:25   DG Chest 2 View Result Date: 02/16/2024 CLINICAL DATA:  Chest pain EXAM: CHEST - 2 VIEW COMPARISON:  Chest radiograph dated 08/10/2023 FINDINGS: Left chest wall pacemaker leads project over the right atrium and ventricle and tributary of the coronary sinus. Patient is rotated to the left. Normal lung volumes. No focal consolidations. No pleural effusion or pneumothorax. Similar postsurgical cardiomediastinal silhouette. Median sternotomy wires are nondisplaced. IMPRESSION: No active cardiopulmonary disease. Electronically Signed   By: Agustin Cree M.D.   On: 02/16/2024 15:29    Procedures Procedures   Medications Ordered in ED Medications  iohexol (OMNIPAQUE) 350 MG/ML injection 75 mL (75 mLs Intravenous Contrast Given 02/16/24 1814)    ED Course/ Medical Decision Making/ A&P                               Medical Decision Making Amount and/or Complexity of Data Reviewed Labs: ordered. Radiology: ordered.  Risk Prescription drug management.   78 year old gentleman with PMHx significant for HTN, HLD, paroxysmal A-fib, DM type II, CAD with  multiple PCI, sick sinus syndrome with pacemaker placement, s/p CABG x 4, CHF who presents to the ED in the setting of not feeling well since this morning.  Patient has missed 2 days of nighttime medications.  On arrival, patient was slightly hypertensive at 140s over 70s, otherwise vital signs are reassuring and patient is afebrile.  Physical exam is only remarkable for 1+ pitting edema bilateral lower extremities.  Differential diagnosis includes pneumonia, anemia, electrolyte or metabolic derangement, hypoglycemia, CHF exacerbation, dysrhythmia, ACS, PE, UTI  EKG is notable for an AV paced rhythm with a RBBB  Workup is reassuring.  There is no leukocytosis, stable hemoglobin, no electrolyte or metabolic derangement, appropriate glucose, COVID/flu negative, urinalysis does not appear to be infectious.  Advised patient and his wife to continue antibiotic for UTI for entire course despite negative urinalysis here as patient is almost completed course.  Initial troponin 17, follow-up troponin 17.  Given troponin and EKG, low suspicion for ACS  CXR was reviewed by myself and noted to have no focal consolidation concerning for pneumonia, no pleural effusion, no pneumothorax.  CTA chest shows no significant pulmonary embolus, there is evidence of cardiac enlargement, no active pulmonary disease.  BNP is slightly elevated at 400, however has been trending up over the past several years.  Anticipate that this escalation is likely secondary to missed medication doses over the last 2 days.  Advised patient of importance of medication compliance, which patient was in understanding and agreement with.  Analyzed [employer] pacemaker which shows no cardiac events or arrhythmias  Patient reports complete improvement in vague symptoms over course of time in the ED.  Patient is felt to be hemodynamically stable for discharge.  Strict return precautions were discussed with patient and  his wife.  Importance of  medication adherence remembering to take medications were discussed with patient and wife.  PCP follow-up was advised.  Final Clinical Impression(s) / ED Diagnoses Final diagnoses:  Feeling unwell    Rx / DC Orders ED Discharge Orders     None      Renella Cunas, PGY-2 Emergency Medicine   Renella Cunas, MD 02/18/24 Dellie Catholic, MD 02/23/24 1537

## 2024-02-16 NOTE — ED Notes (Signed)
 Provider came to bedside to update pt on POC

## 2024-02-16 NOTE — ED Provider Triage Note (Signed)
 Emergency Medicine Provider Triage Evaluation Note  Charles [name] , a 78 y.o. male  was evaluated in triage.  Pt complains of chest pain, feeling generally for 2 days.  Admits to this heart failure, blood pressure medication for few days.  Elevated blood pressure readings at home.  Review of Systems  Positive: Chest pain, shob Negative:   Physical Exam  BP (!) 147/76 (BP Location: Left Arm)   Pulse 73   Temp 97.9 F (36.6 C)   Resp 18   SpO2 97%  Gen:   Awake, no distress   Resp:  Normal effort  MSK:   Moves extremities without difficulty  Other:    Medical Decision Making  Medically screening exam initiated at 2:51 PM.  Appropriate orders placed.  [name] was informed that the remainder of the evaluation will be completed by another provider, this initial triage assessment does not replace that evaluation, and the importance of remaining in the ED until their evaluation is complete.  Workup initiated in triage    Olene Floss, [place] 02/16/24 1454

## 2024-02-20 ENCOUNTER — Ambulatory Visit (INDEPENDENT_AMBULATORY_CARE_PROVIDER_SITE_OTHER): Payer: Medicare Other

## 2024-02-20 DIAGNOSIS — I495 Sick sinus syndrome: Secondary | ICD-10-CM | POA: Diagnosis not present

## 2024-02-21 LAB — CUP PACEART REMOTE DEVICE CHECK
Battery Remaining Longevity: 40 mo
Battery Remaining Percentage: 41 %
Battery Voltage: 2.96 V
Brady Statistic AP VP Percent: 89 %
Brady Statistic AP VS Percent: 1.3 %
Brady Statistic AS VP Percent: 5.9 %
Brady Statistic AS VS Percent: 3.3 %
Brady Statistic RA Percent Paced: 84 %
Date Time Interrogation Session: 20250331052732
Implantable Lead Connection Status: 753985
Implantable Lead Connection Status: 753985
Implantable Lead Connection Status: 753985
Implantable Lead Implant Date: 20091007
Implantable Lead Implant Date: 20091007
Implantable Lead Implant Date: 20200922
Implantable Lead Location: 753858
Implantable Lead Location: 753859
Implantable Lead Location: 753860
Implantable Pulse Generator Implant Date: 20200922
Lead Channel Impedance Value: 1175 Ohm
Lead Channel Impedance Value: 450 Ohm
Lead Channel Impedance Value: 590 Ohm
Lead Channel Pacing Threshold Amplitude: 0.625 V
Lead Channel Pacing Threshold Amplitude: 0.75 V
Lead Channel Pacing Threshold Amplitude: 1.375 V
Lead Channel Pacing Threshold Pulse Width: 0.4 ms
Lead Channel Pacing Threshold Pulse Width: 0.4 ms
Lead Channel Pacing Threshold Pulse Width: 0.5 ms
Lead Channel Sensing Intrinsic Amplitude: 1.7 mV
Lead Channel Sensing Intrinsic Amplitude: 12 mV
Lead Channel Setting Pacing Amplitude: 1.75 V
Lead Channel Setting Pacing Amplitude: 2 V
Lead Channel Setting Pacing Amplitude: 2.375
Lead Channel Setting Pacing Pulse Width: 0.4 ms
Lead Channel Setting Pacing Pulse Width: 0.5 ms
Lead Channel Setting Sensing Sensitivity: 2 mV
Pulse Gen Model: 3562
Pulse Gen Serial Number: 9157656

## 2024-02-27 ENCOUNTER — Ambulatory Visit: Attending: Cardiovascular Disease

## 2024-02-27 DIAGNOSIS — I5042 Chronic combined systolic (congestive) and diastolic (congestive) heart failure: Secondary | ICD-10-CM | POA: Diagnosis not present

## 2024-02-27 DIAGNOSIS — Z950 Presence of cardiac pacemaker: Secondary | ICD-10-CM

## 2024-02-29 ENCOUNTER — Telehealth: Payer: Self-pay

## 2024-02-29 NOTE — Telephone Encounter (Signed)
 Remote ICM transmission received.  Attempted call to patient regarding ICM remote transmission and left detailed message per DPR.  Left ICM phone number and advised to return call for any fluid symptoms or questions. Next ICM remote transmission scheduled 04/02/2024.

## 2024-02-29 NOTE — Progress Notes (Signed)
 EPIC Encounter for ICM Monitoring  Patient Name: Charles [name] is a 78 y.o. male Date: 02/29/2024 Primary Care Physican: [name], PA-C Primary Cardiologist: Tresa Endo Electrophysiologist: Mealor Bi-V Pacing: 92%  03/30/2023 Office weight: 280 lbs 05/24/2023 Weight:  271 lbs 01/20/2024 Office Weight: 275 lbs   AT/AF Burden: 3.7% (taking Xarelto)                                                          Attempted call to patient and unable to reach.  Left detailed message per DPR regarding transmission.  Transmission results reviewed.   3/27 & 4/3 ED visits.     CorVue thoracic impedance suggesting possible fluid accumulation from 2/25-3/13 and 3/30-4/3.     Prescribed: Furosemide 40 mg take 1 tablet (40 mg total) by mouth daily as needed   Labs: 02/16/2024 Creatinine 1.14, BUN 21, Potassium 4.4, Sodium 137, GFR >60 A complete set of results can be found in Results Review.   Recommendations:  Left voice mail with ICM number and encouraged to call if experiencing any fluid symptoms.   Follow-up plan: ICM clinic phone appointment on 04/02/2024.   91 day device clinic remote transmission 05/21/2024.   EP/Cardiology Office Visits:   03/12/2024 with Dr Tresa Endo.   Recall 01/23/2025 with EP APP   Copy of ICM check sent to Dr. Nelly Laurence.   3 month ICM trend: 02/27/2024.    12-14 Month ICM trend:     [name], RN 02/29/2024 10:55 AM

## 2024-03-12 ENCOUNTER — Encounter: Payer: Self-pay | Admitting: Cardiovascular Disease

## 2024-03-12 ENCOUNTER — Other Ambulatory Visit: Payer: Self-pay | Admitting: Internal Medicine

## 2024-03-12 ENCOUNTER — Ambulatory Visit: Payer: Medicare Other | Attending: Cardiovascular Disease | Admitting: Cardiovascular Disease

## 2024-03-12 VITALS — BP 116/62 | HR 69 | Ht 69.0 in | Wt 269.8 lb

## 2024-03-12 DIAGNOSIS — Z951 Presence of aortocoronary bypass graft: Secondary | ICD-10-CM | POA: Insufficient documentation

## 2024-03-12 DIAGNOSIS — D6859 Other primary thrombophilia: Secondary | ICD-10-CM | POA: Diagnosis present

## 2024-03-12 DIAGNOSIS — I119 Hypertensive heart disease without heart failure: Secondary | ICD-10-CM

## 2024-03-12 DIAGNOSIS — I2510 Atherosclerotic heart disease of native coronary artery without angina pectoris: Secondary | ICD-10-CM | POA: Insufficient documentation

## 2024-03-12 DIAGNOSIS — I255 Ischemic cardiomyopathy: Secondary | ICD-10-CM | POA: Diagnosis not present

## 2024-03-12 DIAGNOSIS — E785 Hyperlipidemia, unspecified: Secondary | ICD-10-CM | POA: Diagnosis present

## 2024-03-12 DIAGNOSIS — G4733 Obstructive sleep apnea (adult) (pediatric): Secondary | ICD-10-CM | POA: Diagnosis present

## 2024-03-12 DIAGNOSIS — Z950 Presence of cardiac pacemaker: Secondary | ICD-10-CM | POA: Diagnosis present

## 2024-03-12 DIAGNOSIS — I480 Paroxysmal atrial fibrillation: Secondary | ICD-10-CM | POA: Insufficient documentation

## 2024-03-12 NOTE — Patient Instructions (Signed)
 Medication Instructions:  NO CHANGES *If you need a refill on your cardiac medications before your next appointment, please call your pharmacy*  Lab Work: NO LABS If you have labs (blood work) drawn today and your tests are completely normal, you will receive your results only by: MyChart Message (if you have MyChart) OR A paper copy in the mail If you have any lab test that is abnormal or we need to change your treatment, we will call you to review the results.  Testing/Procedures: NO TESTING  Follow-Up: At [facility], you and your health needs are our priority.  As part of our continuing mission to provide you with exceptional heart care, our providers are all part of one team.  This team includes your primary Cardiologist (physician) and Advanced Practice Providers or APPs (Physician Assistants and Nurse Practitioners) who all work together to provide you with the care you need, when you need it.  Your next appointment:   6 month(s)  Provider:   Randene Bustard, MD and As need with Gaylyn Keas, MD  We recommend signing up for the patient portal called "MyChart".  Sign up information is provided on this After Visit Summary.  MyChart is used to connect with patients for Virtual Visits (Telemedicine).  Patients are able to view lab/test results, encounter notes, upcoming appointments, etc.  Non-urgent messages can be sent to your provider as well.   To learn more about what you can do with MyChart, go to [email or web address].   Other Instructions   1st Floor: - Lobby - Registration  - Pharmacy  - Lab - Cafe  2nd Floor: - PV Lab - Diagnostic Testing (echo, CT, nuclear med)  3rd Floor: - Vacant  4th Floor: - TCTS (cardiothoracic surgery) - AFib Clinic - Structural Heart Clinic - Vascular Surgery  - Vascular Ultrasound  5th Floor: - [facility] (general and EP) - Clinical Pharmacy for coumadin , hypertension, lipid, weight-loss medications, and  med management appointments    Valet parking services will be available as well.

## 2024-03-14 ENCOUNTER — Telehealth: Payer: Self-pay

## 2024-03-14 NOTE — CV Procedure (Addendum)
 Not Safe to Scan per Cardiology PA due to leads with Pacemaker. Per Valeri Gate on 03/14/24.

## 2024-03-14 NOTE — Telephone Encounter (Signed)
 Cardiology let us  know that patient would have to have his MRI cancelled due to his leads being old from 2009. I asked if the patients leads were not changed in 2020 when patient had his pacemaker upgraded. The cardiologist PA let me know that no they were not replaced a third lead was added. I was confused as I went through all the things expected before scheduling the MRI.

## 2024-03-15 NOTE — Progress Notes (Signed)
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 11/22/1969    Quit date: 11/22/1974    Years since quitting: 49.3   Smokeless tobacco: Never   Tobacco comments:    Former smoker 01/27/2022  Vaping Use   Vaping status: Never Used  Substance and Sexual Activity   Alcohol use: No    Alcohol/week: 0.0 standard drinks of alcohol   Drug use: Never   Sexual activity: Not Currently  Other Topics Concern   Not on file  Social History Narrative   Married lives with wife 1 grown child   Retired  Journalist, 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  He is one child.  He previously worked as an International aid/development worker for [employer] .  Family History:  The patient's family history includes Arthritis in his sister; Epilepsy in his brother; Heart attack (age of onset: 57) in his mother; Lung cancer in his maternal grandfather; Neuropathy in his brother; Stroke in his maternal grandmother and paternal grandfather.   ROS General: Negative; No fevers, chills, or night sweats;  HEENT: Negative; No changes in vision or hearing, sinus congestion, difficulty swallowing Pulmonary:  Negative; No cough, wheezing, shortness of breath, hemoptysis Cardiovascular: See history of present illness GI: Recent constipation GU: Negative; No dysuria, hematuria, or difficulty voiding Musculoskeletal: Right knee discomfort, left carpal tunnel. Hematologic/Oncology: Negative; no easy bruising, bleeding Endocrine: Positive for diabetes mellitus. Neuro: Negative; no changes in balance, headaches Skin: Negative; No  rashes or skin lesions Psychiatric: Negative; No behavioral problems, depression Sleep: Positive for obstructive sleep apnea on CPAP; nobruxism, restless legs, hypnogognic hallucinations, no cataplexy Other comprehensive 14 point system review is negative.   PHYSICAL EXAM:   VS:  BP 116/62 (BP Location: Left Arm, Patient Position: Sitting, Cuff Size: Large)   Pulse 69   Ht 5\' 9"  (1.753 m)   Wt 269 lb 12.8 oz (122.4 kg)   SpO2 93%   BMI 39.84 kg/m     Repeat blood pressure by me was 118/60  Wt Readings from Last 3 Encounters:  03/12/24 269 lb 12.8 oz (122.4 kg)  02/16/24 270 lb (122.5 kg)  01/31/24 274 lb 3.2 oz (124.4 kg)   General: Alert, oriented, no distress.  Skin: normal turgor, no rashes, warm and dry HEENT: Normocephalic, atraumatic. Pupils equal round and reactive to light; sclera anicteric; extraocular muscles intact; Nose without nasal septal hypertrophy Mouth/Parynx benign; Mallinpatti scale 3 Neck: No JVD, no carotid bruits; normal carotid upstroke Lungs: clear to ausculatation and percussion; no wheezing or rales Chest wall: without tenderness to palpitation Heart: PMI not displaced, RRR, s1 s2 normal, 1/6 systolic murmur, no diastolic murmur, no rubs, gallops, thrills, or heaves Abdomen: soft, nontender; no hepatosplenomehaly, BS+; abdominal aorta nontender and not dilated by palpation. Back: no CVA tenderness Pulses 2+ Musculoskeletal: full range of motion, normal strength, no joint deformities Extremities: no clubbing cyanosis or edema,  Homan's sign negative  Neurologic: grossly nonfocal; Cranial nerves grossly wnl Psychologic: Normal mood and affect      Studies/Labs Reviewed:   EKG Interpretation Date/Time:  Monday March 12 2024 10:14:41 EDT Ventricular Rate:  71 PR Interval:  188 QRS Duration:  144 QT Interval:  462 QTC Calculation: 502 R Axis:   143  Text Interpretation: AV dual-paced rhythm with Fusion complexes and Premature atrial complexes with Abberant conduction When compared with ECG of 12-Mar-2024 10:14, Fusion complexes are now Present Premature ventricular complexes are no longer Present Abberant conduction is now Present Vent. rate has decreased BY   2 BPM Confirmed by Magnus Schuller ([id number]) on 03/15/2024 3:40:39 PM    August 30, 2023 ECG (independently read by me): AV paced at 77  November 08, 2022 ECG (independently read by me): AV paced rhythm at 75, PR interval 216 ms.  January 12, 2022 ECG (independently read by me): Atrial fibrillation at 97, LBBB  Apr 06, 2021 ECG (independently read by me): AV paced at 70; PR 214 msec    November 2020 ECG (independently read by me): AV paced rhythm at 70 bpm with prolonged AV conduction.  PR interval 224 ms.  January 04, 2019 ECG (independently read by me): Atrially paced rhythm at 65 bpm with prolonged AV conduction with a peroneal a 254 ms.  Left bundle branch block.  June 26, 2018 ECG (independently read by me):  Atrial paced at 71, prolonged AV conduction PR 250 msec; [facility]  June 07, 2018 ECG (independently read by me): Atrially paced rhythm at 70 bpm with prolonged AV conduction with PR interval at 238.  Left bundle branch block.  May 2019 ECG (independently read by me): Atrially paced rhythm at 76 bpm with prolonged AV conduction with a PR interval 236 ms.  Left bundle branch block.  January 2019 ECG (independently read by me): Atrial paced rhythm at 70 bpm.  Prolonged AV conduction with a PR interval 248.  Left bundle branch block.  QTc interval 494  ms.  April 2018 ECG (independently read by  me): Atrially paced rhythm at 74 bpm, prolonged A-V conduction with a PR 246.    ECG (independently read by me): Atrial paced rhythm at 76 bpm alternating with atrial sensing in a bigeminal pattern.  There is left bundle branch block.  Recent Labs:    Latest Ref Rng & Units 02/16/2024    3:01 PM 09/24/2023   11:41 PM 08/10/2023    1:25 PM  BMP  Glucose 70 - 99 mg/dL 161  096  045   BUN 8 - 23 mg/dL 21  20  19    Creatinine 0.61 - 1.24 mg/dL 4.09  8.11  9.14   Sodium 135 - 145 mmol/L 137  137  137   Potassium 3.5 - 5.1 mmol/L 4.4  3.9  3.9   Chloride 98 - 111 mmol/L 103  107  108   CO2 22 - 32 mmol/L 24  23  20    Calcium  8.9 - 10.3 mg/dL 9.0  8.6  8.8         Latest Ref Rng & Units 09/24/2023   11:41 PM 08/10/2023    1:25 PM 06/01/2023    8:39 PM  Hepatic Function  Total Protein 6.5 - 8.1 g/dL 7.3  6.9  7.5   Albumin  3.5 - 5.0 g/dL 3.8  3.5  3.9   AST 15 - 41 U/L 17  21  22    ALT 0 - 44 U/L 23  24  35   Alk Phosphatase 38 - 126 U/L 61  57  78   Total Bilirubin 0.3 - 1.2 mg/dL 0.8  0.7  0.7        Latest Ref Rng & Units 02/16/2024    3:01 PM 09/24/2023   11:41 PM 08/10/2023    1:25 PM  CBC  WBC 4.0 - 10.5 K/uL 6.0  7.2  8.2   Hemoglobin 13.0 - 17.0 g/dL 78.2  95.6  21.3   Hematocrit 39.0 - 52.0 % 43.7  46.7  47.6   Platelets 150 - 400 K/uL 196  196  199    Lab Results  Component Value Date   MCV 94.0 02/16/2024   MCV 95.5 09/24/2023   MCV 94.8 08/10/2023   Lab Results  Component Value Date   TSH 1.39 01/31/2024   Lab Results  Component Value Date   HGBA1C 7.0 (A) 01/19/2024     BNP    Component Value Date/Time   BNP 407.2 (H) 02/16/2024 1501   BNP 77.0 07/03/2013 1604    ProBNP    Component Value Date/Time   PROBNP 739 (H) 08/07/2018 0912   PROBNP 364.0 (H) 10/21/2015 0932     Lipid Panel     Component Value Date/Time   CHOL 170 07/21/2022 1017   TRIG 124.0 07/21/2022 1017   HDL 44.20 07/21/2022 1017    CHOLHDL 4 07/21/2022 1017   VLDL 24.8 07/21/2022 1017   LDLCALC 101 (H) 07/21/2022 1017   LDLDIRECT 76 01/31/2023 0811     RADIOLOGY: CUP PACEART REMOTE DEVICE CHECK Result Date: 02/21/2024 Scheduled remote reviewed. Normal device function.  Presenting rhythm:  AP/BiV pace, PVC's Next remote 91 days. LA, CVRS  CT Angio Chest PE W and/or Wo Contrast Result Date: 02/16/2024 CLINICAL DATA:  Concern for pulmonary embolus. Shortness of breath. Unwell feeling. EXAM: CT ANGIOGRAPHY CHEST WITH CONTRAST TECHNIQUE: Multidetector CT imaging of the chest was performed using the standard protocol during bolus administration of intravenous contrast. Multiplanar CT image reconstructions and MIPs were obtained to evaluate  the vascular anatomy. RADIATION DOSE REDUCTION: This exam was performed according to the departmental dose-optimization program which includes automated exposure control, adjustment of the mA and/or kV according to patient size and/or use of iterative reconstruction technique. CONTRAST:  75mL OMNIPAQUE  IOHEXOL  350 MG/ML SOLN COMPARISON:  Chest radiograph 02/16/2024. FINDINGS: Cardiovascular: Technically adequate study with good opacification of the central and segmental pulmonary arteries. Mild motion artifact. No focal filling defects are demonstrated in the pulmonary arteries. No evidence of significant pulmonary embolus. Cardiac enlargement. No pericardial effusions. Postoperative changes consistent with coronary bypass. Normal caliber thoracic aorta. Calcification in the aorta and coronary arteries. Mediastinum/Nodes: Esophagus is decompressed. Thyroid  gland is unremarkable. No mass or lymphadenopathy. Lungs/Pleura: Motion artifact limits evaluation. No airspace disease or consolidation suggested. No pleural effusion or pneumothorax. Upper Abdomen: Surgical absence of the gallbladder. Calcified granulomas in the liver and spleen. Subcentimeter low-attenuation lesions in the liver are too small to  characterize but likely represent small cysts or hemangiomas. Musculoskeletal: Degenerative changes in the spine. Sternotomy wires. No acute bony abnormalities. Review of the MIP images confirms the above findings. IMPRESSION: 1. No evidence of significant pulmonary embolus. 2. Cardiac enlargement. 3. Aortic atherosclerosis. 4. No evidence of active pulmonary disease. Electronically Signed   By: Boyce Byes M.D.   On: 02/16/2024 19:25   DG Chest 2 View Result Date: 02/16/2024 CLINICAL DATA:  Chest pain EXAM: CHEST - 2 VIEW COMPARISON:  Chest radiograph dated 08/10/2023 FINDINGS: Left chest wall pacemaker leads project over the right atrium and ventricle and tributary of the coronary sinus. Patient is rotated to the left. Normal lung volumes. No focal consolidations. No pleural effusion or pneumothorax. Similar postsurgical cardiomediastinal silhouette. Median sternotomy wires are nondisplaced. IMPRESSION: No active cardiopulmonary disease. Electronically Signed   By: Limin  Xu M.D.   On: 02/16/2024 15:29     Additional studies/ records that were reviewed today include:  I reviewed the patient's hospitalizations, office visits evaluations, notes from Dr. Nicanor Barge, laboratory, and  catheterization data, I reviewed his echo Doppler study as well as his recent evaluation from [facility]. Recent hospitalization for ICD implant. A new download was obtained of his CPAP unit from October 17 through October 07, 2019.  EP records were reviewed.  Download was obtained from April 14 through Apr 03, 2021 which shows excellent compliance with average use at 9 hours and 16 minutes.  There is significant mask leak.  AutoSet pressure range was from 16 to 20 cm.  AHI 13.2.  A new download was obtained from January 22 through January 11, 2022 as noted above  A download was obtained from March 14 through March 04, 2023.   Mar 30, 2023 CLINICAL INFORMATION The patient is referred for a BiPAP titration  to treat sleep apnea.   On CPAP therapy with last machine set up July 12, 2018: currently at maximal pressure with continued events.   SLEEP STUDY TECHNIQUE As per the AASM Manual for the Scoring of Sleep and Associated Events v2.3 (April 2016) with a hypopnea requiring 4% desaturations.   The channels recorded and monitored were frontal, central and occipital EEG, electrooculogram (EOG), submentalis EMG (chin), nasal and oral airflow, thoracic and abdominal wall motion, anterior tibialis EMG, snore microphone, electrocardiogram, and pulse oximetry. Bilevel positive airway pressure (BPAP) was initiated at the beginning of the study and titrated to treat sleep-disordered breathing.   MEDICATIONS acetaminophen  (TYLENOL ) 650 MG CR tablet alfuzosin  (UROXATRAL ) 10 MG 24 hr tablet aspirin  EC 81 MG EC tablet BD INSULIN   SYRINGE U/F 30G X 1/2" 0.5 ML MISC BD ULTRA-FINE PEN NEEDLES 29G X 12.7MM MISC carvedilol  (COREG ) 12.5 MG tablet cephALEXin  (KEFLEX ) 500 MG capsule COLLAGEN PO Continuous Blood Gluc Receiver (FREESTYLE LIBRE 14 DAY READER) DEVI Continuous Blood Gluc Sensor (FREESTYLE LIBRE 14 DAY SENSOR) MISC cyanocobalamin 1000 MCG tablet ENTRESTO  49-51 MG ezetimibe  (ZETIA ) 10 MG tablet FARXIGA  5 MG TABS tablet fluticasone (FLONASE) 50 MCG/ACT nasal spray furosemide  (LASIX ) 40 MG tablet gabapentin  (NEURONTIN ) 300 MG capsule Glucagon  (GVOKE HYPOPEN  2-PACK) 1 MG/0.2ML SOAJ insulin  degludec (TRESIBA  FLEXTOUCH) 100 UNIT/ML FlexTouch Pen latanoprost (XALATAN) 0.005 % ophthalmic solution meclizine (ANTIVERT) 25 MG tablet metFORMIN  (GLUCOPHAGE -XR) 500 MG 24 hr tablet nitroGLYCERIN  (NITROSTAT ) 0.4 MG SL tablet NOVOLOG  100 UNIT/ML injection promethazine  (PHENERGAN ) 25 MG tablet tiZANidine (ZANAFLEX) 2 MG tablet triamcinolone  cream (KENALOG) 0.1 % Vibegron (GEMTESA PO) XARELTO  20 MG TABS tablet zinc  gluconate 50 MG tablet Medications self-administered by patient taken the night of the study :  LATANOPROST   RESPIRATORY PARAMETERS Optimal IPAP Pressure (cm):20AHI at Optimal Pressure (/hr)0.5 Optimal EPAP Pressure (cm):            16           Overall Minimal O2 (%):84.0Minimal O2 at Optimal Pressure (%):91.0   SLEEP ARCHITECTURE Start Time:10:21:09 PMStop Time:4:29:56 AMTotal Time (min):368.8Total Sleep Time (min):357 Sleep Latency (min):2.8Sleep Efficiency (%):96.8%REM Latency (min):178.5WASO (min):9.0 Stage N1 (%):0.7%Stage N2 (%):95.4%Stage N3 (%):0.0%Stage R (%):3.9 Supine (%):100.00Arousal Index (/hr):5.5          CARDIAC DATA The 2 lead EKG demonstrated pacemaker generated. The mean heart rate was 73.0 beats per minute. Other EKG findings include: PVCs.   LEG MOVEMENT DATA The total Periodic Limb Movements of Sleep (PLMS) were 0. The PLMS index was 0.0. A PLMS index of <15 is considered normal in adults.   IMPRESSIONS - BiPAP was initiated at 8/4and was titrated to 20/16 cm (AHI 0.5/h, O2 nadir 92% with absent REM sleep at 20/16. - Central sleep apnea was not noted during this titration (CAI 1.2/h). - Moderete oxygen desaturations were observed during this titration to a nadir of 84% at 11/7. - The patient snored with moderate snoring volume. - 2-lead EKG demonstrated: PVCs, paced rhythm - Clinically significant periodic limb movements were not noted during this study. Arousals associated with PLMs were rare.   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  A large size Resmed Full Face Quattro FX mask was used for the titration. - Effort should be made to optimize nasal and oropharyngeal patency. - Consider an oral guard for Bruxism. - Avoid alcohol, sedatives and other CNS depressants that may worsen sleep apnea and disrupt normal sleep architecture. - Sleep hygiene should be  reviewed to assess factors that may improve sleep quality. - Weight management (BMI 41) and regular exercise should be initiated or continued. - Recommend a download and sleep clinic evaluation after 4 - 6 weeks of therapy    ASSESSMENT:    1. Coronary artery disease involving native coronary artery of native heart without angina pectoris   2. S/P CABG x 4   3. Ischemic cardiomyopathy   4. PAF (paroxysmal atrial fibrillation) (HCC)   5. OSA on BiPAP   6. Hyperlipidemia LDL goal <55   7. Cardiac pacemaker in situ   8. Hypercoagulable state (HCC)  PLAN:  Charles [name] is a 78 year-old gentleman who had previously undergone multiple coronary interventions since 1995 for significant coronary obstructive disease.  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 Nicanor Barge on 09/15/2016.  He has a history of PAF and was maintaining sinus rhythm with atrial pacing.  He is on Xarelto  for anticoagulation.   His pacemaker was followed by Dr. Nunzio Belch and in September 2020 had implantation/upgrade of a dual chamber PPM to CRT-P on 08/14/2019 by Dr Nunzio Belch. and received a Physicist, medical model [id number] (serial number [id number]) lead, [employer] Guthrie MP model [id number] (serial  Number [id number]) biventricular pacemaker.  His echo on August 13, 2019 revealed an EF of 35 to 40% with mild LVH grade 1 diastolic dysfunction, and moderate mitral regurgitation.  He was on Entresto  24/26 mg twice a day, Farxiga  2.5 mg twice a day, carvedilol  6.25 mg twice a day, and as needed furosemide .  His echo study from May 07, 2021 demonstrated normalization of LV function with EF at 55 to 60%.  There were no wall motion abnormalities.  There was grade 1 diastolic dysfunction.  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  He was last evaluated in A-fib clinic in September 2023 and was maintaining sinus rhythm and he was  maintained on carvedilol  12.5 mg twice a day and Xarelto  20 mg daily.  When seen by me in February 2023 he was in atrial fibrillation.  At that time I recommended he titrate carvedilol  from 6.25 mg twice a day to 12.5 mg twice a day.  Follow-up EP evaluation was recommended.  At his follow-up evaluation in April 2024, blood pressure was stable on his regimen of carvedilol  12.5 mg twice a day, Entresto  49/51 mg twice a day, Farxiga  5 mg, furosemide  40 mg daily.  He is diabetic on Farxiga  in addition to NovoLog  and Tresiba  FlexTouch.  He is statin intolerant and has been on Zetia  10 mg for hyperlipidemia.  Laboratory on January 31, 2023 showed that his LDL cholesterol had improved from 108 down to 76.  Ultimate target is less than 55.  If unable to reach, he may be a candidate for trial of bempedoic acid or possible Repatha.  Due to his continued AHI elevation, he ultimately underwent his BiPAP titration evaluation.  He received a new ResMed air curve 11 BiPAP unit on June 29, 2023.  Initial download showed he was meeting compliance standards and requiring high pressure his 95th percentile pressure at 23.4/19.4 with AHI 3.0.  On his most  download from September 3 to August 24, 2023 he has had declined.  Sleep duration was only 5 hours and 31 minutes per night.  He had significant mask leak and AHI had increased to 6.2.  At his office visit in October 2024 blood pressure was low with orthostatic drop resulting in reduction in his Entresto  dose down to 24/26 twice daily.  He is now back on increased dose of carvedilol  12.5 mg twice a day when seen by Dr. Arlester Ladd and is taking furosemide .  I have suggested he can changes to as needed.  He is no longer on Zetia .  He is anticoagulated with Xarelto .  He is diabetic on insulin  and Tresiba  FlexTouch.  I reviewed his most recent download.  He has significant mask leak contributing to an elevated AHI of 12.4.  I have provided him with  a new ResMed AirFit F30i sample mask today  to see if this will significantly improve leak.  He is at fairly maximum pressure with minimum EPAP of 20, pressure support at 4 with maximum IPAP of 25 I will decrease his EPAP minimum down to 15 cm to see if this also can improve his leak.  I discussed with him my plans.  Months.  Particularly with his CAD history, I will transition him to the cardiology care of Dr. Randene Bustard and if you we will see Dr. Micael Adas as needed for some   Medication Adjustments/Labs and Tests Ordered: Current medicines are reviewed at length with the patient today.  Concerns regarding medicines are outlined above.  Medication changes, Labs and Tests ordered today are listed in the Patient Instructions below. Patient Instructions  Medication Instructions:  NO CHANGES *If you need a refill on your cardiac medications before your next appointment, please call your pharmacy*  Lab Work: NO LABS If you have labs (blood work) drawn today and your tests are completely normal, you will receive your results only by: MyChart Message (if you have MyChart) OR A paper copy in the mail If you have any lab test that is abnormal or we need to change your treatment, we will call you to review the results.  Testing/Procedures: NO TESTING  Follow-Up: At [facility], you and your health needs are our priority.  As part of our continuing mission to provide you with exceptional heart care, our providers are all part of one team.  This team includes your primary Cardiologist (physician) and Advanced Practice Providers or APPs (Physician Assistants and Nurse Practitioners) who all work together to provide you with the care you need, when you need it.  Your next appointment:   6 month(s)  Provider:   Randene Bustard, MD and As need with Gaylyn Keas, MD  We recommend signing up for the patient portal called "MyChart".  Sign up information is provided on this After Visit Summary.  MyChart is used to connect with patients for  Virtual Visits (Telemedicine).  Patients are able to view lab/test results, encounter notes, upcoming appointments, etc.  Non-urgent messages can be sent to your provider as well.   To learn more about what you can do with MyChart, go to [email or web address].   Other Instructions   1st Floor: - Lobby - Registration  - Pharmacy  - Lab - Cafe  2nd Floor: - PV Lab - Diagnostic Testing (echo, CT, nuclear med)  3rd Floor: - Vacant  4th Floor: - TCTS (cardiothoracic surgery) - AFib Clinic - Structural Heart Clinic - Vascular Surgery  - Vascular Ultrasound  5th Floor: - HeartCare Cardiology (general and EP) - Clinical Pharmacy for coumadin , hypertension, lipid, weight-loss medications, and med management appointments    Valet parking services will be available as well.      Signed, Magnus Schuller, MD  03/15/2024 3:50 PM    [facility] [address], [place], [place]  [address] Phone: [phone]

## 2024-03-16 ENCOUNTER — Encounter: Payer: Self-pay | Admitting: Psychology

## 2024-03-16 DIAGNOSIS — R251 Tremor, unspecified: Secondary | ICD-10-CM | POA: Insufficient documentation

## 2024-03-16 DIAGNOSIS — I679 Cerebrovascular disease, unspecified: Secondary | ICD-10-CM | POA: Insufficient documentation

## 2024-03-19 ENCOUNTER — Ambulatory Visit: Admitting: Psychology

## 2024-03-19 ENCOUNTER — Ambulatory Visit: Payer: Self-pay | Admitting: Psychology

## 2024-03-19 ENCOUNTER — Encounter: Payer: Self-pay | Admitting: Psychology

## 2024-03-19 DIAGNOSIS — F332 Major depressive disorder, recurrent severe without psychotic features: Secondary | ICD-10-CM | POA: Diagnosis not present

## 2024-03-19 DIAGNOSIS — F411 Generalized anxiety disorder: Secondary | ICD-10-CM

## 2024-03-19 DIAGNOSIS — R251 Tremor, unspecified: Secondary | ICD-10-CM | POA: Diagnosis not present

## 2024-03-19 DIAGNOSIS — R4189 Other symptoms and signs involving cognitive functions and awareness: Secondary | ICD-10-CM

## 2024-03-19 DIAGNOSIS — I679 Cerebrovascular disease, unspecified: Secondary | ICD-10-CM

## 2024-03-19 DIAGNOSIS — G3184 Mild cognitive impairment, so stated: Secondary | ICD-10-CM | POA: Diagnosis not present

## 2024-03-19 HISTORY — DX: Mild cognitive impairment of uncertain or unknown etiology: G31.84

## 2024-03-19 NOTE — Progress Notes (Signed)
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 dreamt was actually occurring. There have also been concerns expressed surrounding both hypnopompic and hypnagogic hallucinations in the past. When asked today, he also alluded to potential fully-formed hallucinations while fully awake. However, these were challenging to confirm as they generally appear as things moving quickly in his peripheral vision which are not present straight on. These also sometimes appear as cats, which may in fact be one of his two cats quickly moving around the home.  Delusional thoughts: Denied.  Tobacco: Denied. Alcohol: He denied current alcohol consumption as well as a history of problematic alcohol abuse or dependence.  Recreational drugs: Denied.  Family History: Problem Relation Age of Onset   Heart attack Mother 73       Died age 71   Tremor Mother    Dementia Mother 30   Arthritis Sister    Epilepsy Brother    Neuropathy Brother    Stroke Maternal Grandmother    Lung cancer Maternal Grandfather    Stroke Paternal Grandfather    Tremor Maternal Aunt    Colon cancer Neg Hx    Esophageal cancer Neg Hx    Pancreatic cancer Neg Hx    Rectal cancer Neg Hx    Stomach cancer Neg Hx    This information was  confirmed by Mr. Sedano.  Academic/Vocational History: Highest level of educational attainment: 12 years. He graduated from high school and completed one additional semester of college. He described himself as a "horrible" student in academic settings. He noted trouble with history-based classes, as well as any math-based courses above basic techniques (i.e., algebra and beyond).  History of developmental delay: Denied. History of grade repetition: Denied. However, he did report being some credits shy  at the time of graduation, requiring him to complete a summer course to fully meet high school graduation requirements.  Enrollment in special education courses: Denied. History of LD/ADHD: Denied.  Employment: Retired. 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   Evaluation Results:   Behavioral Observations: Mr. Suite was accompanied by his wife, arrived to his appointment on time, and was appropriately dressed and groomed. He appeared alert. He ambulated somewhat slowly and with the assistance of a cane. However, frank instability was not observed. Gross motor functioning appeared intact upon informal observation and no abnormal movements (e.g., tremors) were noted during interview. His affect was generally relaxed and positive, but did range appropriately given the subject being discussed during interview. Spontaneous speech was fluent and word finding difficulties were not observed during the clinical interview. Thought processes were coherent, organized, and normal in content. Insight into his cognitive difficulties appeared adequate.   During testing, a right-handed tremor was observed. This appeared to vacillate throughout testing with it being far more  pronounced during some portions of testing relative to others. Sustained attention was appropriate. Task engagement was adequate and he persisted when challenged. He was mildly repetitive when making statements to the psychometrist throughout. Overall, Mr. Goodner was cooperative with the clinical interview and subsequent testing procedures.   Adequacy of Effort: The validity of neuropsychological testing is limited by the extent to which the individual being tested may be assumed to have exerted adequate effort during testing. Mr. Zamani expressed his intention to perform to the best of his abilities and exhibited adequate task engagement and persistence. Scores across stand-alone and embedded performance validity measures were within expectation. As such, the results of the current evaluation are believed to be a valid representation of Mr. Davia current cognitive functioning.  Test Results: Mr. Ellyson was mildly disoriented at the time of the current evaluation. He was two days off when stating the current date and unable to state the current day of the week. He also incorrectly named the current clinic [facility]").   Intellectual abilities based upon educational and vocational attainment were estimated to be in the below average to average range. Premorbid abilities were estimated to be within the average range based upon a single-word reading test.  Processing speed was variable, ranging from the exceptionally low to average normative ranges. Basic attention was average. More complex attention (e.g., working memory) was below average. Executive functioning was exceptionally low to below average.  While not directly assessed, receptive language abilities were believed to be intact. Mr. Tavani did not exhibit prominent difficulties comprehending task instructions and answered all questions asked of him appropriately. Assessed expressive language was variable. Phonemic fluency was well  below average, semantic fluency was well below average to above average, and confrontation naming was average to above average.   Assessed visuospatial/visuoconstructional abilities were exceptionally low to below average. Across his drawing of a clock, he included the number 6 twice and exhibited notable numerical spacing abnormalities (i.e., numbers were fully omitted from the bottom right quadrant). He also exhibited confusion and was only able to draw a single clock hand pointing to the number 12. Points were lost across his copy of a complex figure due to a few mild visual distortions. Some of this may  be stylistic as he commonly did not complete lines to their associated end points. He also omitted two aspects from his drawing entirely.    Learning (i.e., encoding) of novel verbal information was exceptionally low to well below average. Spontaneous delayed recall (i.e., retrieval) of previously learned information was also exceptionally low to well below average. Retention rates were 0% across a list learning task, 60% (raw score of 3) across a story learning task, and 36% across a figure drawing task. Performance across recognition tasks was variable, ranging from the well below average to average normative ranges, suggesting some evidence for information consolidation.   Results of emotional screening instruments suggested that recent symptoms of generalized anxiety were in the mild to moderate range, while symptoms of depression were within the severe range. A screening instrument assessing recent sleep quality suggested the presence of minimal sleep dysfunction.  Table of Scores:   Note: This summary of test scores accompanies the interpretive report and should not be considered in isolation without reference to the appropriate sections in the text. Descriptors are based on appropriate normative data and may be adjusted based on clinical judgment. Terms such as "Within Normal Limits" and "Outside  Normal Limits" are used when a more specific description of the test score cannot be determined.       Percentile - Normative Descriptor > 98 - Exceptionally High 91-97 - Well Above Average 75-90 - Above Average 25-74 - Average 9-24 - Below Average 2-8 - Well Below Average < 2 - Exceptionally Low       Validity:   DESCRIPTOR       DCT: --- --- Within Normal Limits  RBANS EI: --- --- Within Normal Limits       Orientation:      Raw Score Percentile   NAB Orientation, Form 1 24/29 --- ---       Cognitive Screening:      Raw Score Percentile   SLUMS: 19/30 --- ---       RBANS, Form A: Standard Score/ Scaled Score Percentile   Total Score 67 1 Exceptionally Low  Immediate Memory 57 <1 Exceptionally Low    List Learning 2 <1 Exceptionally Low    Story Memory 4 2 Well Below Average  Visuospatial/Constructional 69 2 Well Below Average    Figure Copy 4 2 Well Below Average    Line Orientation 12/20 3-9 Well Below Average  Language 85 16 Below Average    Picture Naming 10/10 51-75 Average    Semantic Fluency 4 2 Well Below Average  Attention 75 5 Well Below Average    Digit Span 10 50 Average    Coding 2 <1 Exceptionally Low  Delayed Memory 82 12 Below Average    List Recall 0/10 <2 Exceptionally Low    List Recognition 19/20 26-50 Average    Story Recall 4 2 Well Below Average    Story Recognition 8/12 8-15 Well Below Average  to Below Average    Figure Recall 5 5 Well Below Average    Figure Recognition 3/8 4-8 Well Below Average        Intellectual Functioning:      Standard Score Percentile   Test of Premorbid Functioning: 105 63 Average       Attention/Executive Function:     Trail Making Test (TMT): Raw Score (T Score) Percentile     Part A 143 secs.,  0 errors (21) <1 Exceptionally Low    Part B Discontinued --- Impaired  Scaled Score Percentile   WAIS-5 Coding: 4 2 Well Below Average  WAIS-5 Naming Speed Quantity: 9 37 Average        Scaled Score  Percentile   WAIS-5 Digits Forwards: 11 63 Average  WAIS-5 Digit Sequencing: 6 9 Below Average        Scaled Score Percentile   WAIS-5 Similarities: 5 5 Well Below Average  WAIS-5 Figure Weights: 6 9 Below Average       D-KEFS Verbal Fluency Test: Raw Score (Scaled Score) Percentile     Letter Total Correct 18 (5) 5 Well Below Average    Category Total Correct 30 (9) 37 Average    Category Switching Total Correct 4 (1) <1 Exceptionally Low    Category Switching Accuracy 2 (1) <1 Exceptionally Low      Total Set Loss Errors 2 (10) 50 Average      Total Repetition Errors 1 (12) 75 Above Average       Language:     Verbal Fluency Test: Raw Score (T Score) Percentile     Phonemic Fluency (FAS) 18 (35) 7 Well Below Average    Animal Fluency 20 (57) 75 Above Average        NAB Language Module, Form 1: T Score Percentile     Naming 31/31 (60) 84 Above Average       Visuospatial/Visuoconstruction:      Raw Score Percentile   Clock Drawing: 4/10 --- Impaired  Hooper VOT: 11.5/30 --- Very High Probability  of Impairment        Scaled Score Percentile   WAIS-5 Block Design: 6 9 Below Average  WAIS-5 Visual Puzzles: 7 16 Below Average       Mood and Personality:      Raw Score Percentile   Geriatric Depression Scale: 20 --- Severe  Geriatric Anxiety Scale: 21 --- Mild    Somatic 8 --- Mild    Cognitive 7 --- Moderate    Affective 6 --- Mild       Additional Questionnaires:      Raw Score Percentile   PROMIS Sleep Disturbance Questionnaire: 9 --- None to Slight   Informed Consent and Coding/Compliance:   The current evaluation represents a clinical evaluation for the purposes previously outlined by the referral source and is in no way reflective of a forensic evaluation.   Mr. Stanard was provided with a verbal description of the nature and purpose of the present neuropsychological evaluation. Also reviewed were the foreseeable risks and/or discomforts and benefits of the  procedure, limits of confidentiality, and mandatory reporting requirements of this provider. The patient was given the opportunity to ask questions and receive answers about the evaluation. Oral consent to participate was provided by the patient.   This evaluation was conducted by Melville Stade, Ph.D., ABPP-CN, board certified clinical neuropsychologist. Mr. Zahorik completed a clinical interview with Dr. Kitty Perkins, billed as one unit [phone], and 150 minutes of cognitive testing and scoring, billed as one unit [phone] and four additional units [id number]. Psychometrist Arnulfo Larch, B.S. assisted Dr. Kitty Perkins with test administration and scoring procedures. As a separate and discrete service, one unit [id number] and two units [phone] were billed for Dr. Arsenio Larger time spent in interpretation and report writing.

## 2024-03-19 NOTE — Progress Notes (Signed)
   Psychometrician Note   Cognitive testing was administered to Charles [name] by Arnulfo Larch, B.S. (psychometrist) under the supervision of Dr. Melville Stade, Ph.D., ABPP, licensed psychologist on 03/19/2024. [name] did not appear overtly distressed by the testing session per behavioral observation or responses across self-report questionnaires. Rest breaks were offered.    The battery of tests administered was selected by Dr. Melville Stade, Ph.D., ABPP with consideration to [name] current level of functioning, the nature of his symptoms, emotional and behavioral responses during interview, level of literacy, observed level of motivation/effort, and the nature of the referral question. This battery was communicated to the psychometrist. Communication between Dr. Melville Stade, Ph.D., ABPP and the psychometrist was ongoing throughout the evaluation and Dr. Melville Stade, Ph.D., ABPP was immediately accessible at all times. Dr. Zachary C. Merz, Ph.D., ABPP provided supervision to the psychometrist on the date of this service to the extent necessary to assure the quality of all services provided.    Charles [name] will return within approximately 1-2 weeks for an interactive feedback session with Dr. Kitty Perkins at which time his test performances, clinical impressions, and treatment recommendations will be reviewed in detail. [name] understands he can contact our office should he require our assistance before this time.  A total of 150 minutes of billable time were spent face-to-face with [name] by the psychometrist. This includes both test administration and scoring time. Billing for these services is reflected in the clinical report generated by Dr. Melville Stade, Ph.D., ABPP  This note reflects time spent with the psychometrician and does not include test scores or any clinical interpretations made by Dr. Kitty Perkins. The full report will follow in a separate note.

## 2024-03-23 ENCOUNTER — Ambulatory Visit (HOSPITAL_COMMUNITY)
Admission: RE | Admit: 2024-03-23 | Discharge: 2024-03-23 | Disposition: A | Discharge status: Home / Self Care | Source: Ambulatory Visit | Attending: Neurology | Admitting: Neurology

## 2024-03-27 ENCOUNTER — Ambulatory Visit: Admitting: Psychology

## 2024-03-27 DIAGNOSIS — R251 Tremor, unspecified: Secondary | ICD-10-CM

## 2024-03-27 DIAGNOSIS — G3184 Mild cognitive impairment, so stated: Secondary | ICD-10-CM | POA: Diagnosis not present

## 2024-03-27 DIAGNOSIS — I679 Cerebrovascular disease, unspecified: Secondary | ICD-10-CM

## 2024-03-27 NOTE — Progress Notes (Signed)
   Neuropsychology Feedback Session Tommas Fragmin. [facility] Department of Neurology  Reason for Referral:   Charles [name] is a 78 y.o. right-handed Caucasian male referred by Fran Imus, D.O., to characterize his current cognitive functioning and assist with diagnostic clarity and treatment planning in the context of subjective cognitive decline.   Feedback:   Charles [name] completed a comprehensive neuropsychological evaluation on 03/19/2024. Please refer to that encounter for the full report and recommendations. Briefly, results suggested impairment surrounding executive functioning, phonemic fluency, visuospatial abilities, and both encoding (i.e., learning) and delayed retrieval aspects of memory. While variable, impairment was exhibited across 3/4 processing speed tasks, suggesting prominent dysfunction. Variability was also exhibited across semantic fluency and recognition/consolidation aspects of memory. The etiology for ongoing dysfunction is unclear as his testing profile is fairly nonspecific. 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 He also has experienced a prior heart attack and has numerous chronic medical ailments, including type II diabetes, chronic kidney disease, obstructive sleep apnea, essential tremor (if applicable), and many more which are cardiovascular in nature. Despite largely denying psychiatric concerns during interview, he reported acute symptoms of mild to moderate anxiety and severe depression across mood-related questionnaires. He also described ongoing chronic pain and his wife raised concerns surrounding uncorrected hearing loss. It remains plausible that the combination of all of these factors can explain ongoing cognitive deficits and there is not a true underlying neurodegenerative cause. With that being said, I do have some concern surrounding an underlying neurological condition, especially one  in the parkinsonian family. His pattern across testing aligns with what is commonly seen across these illnesses. This is especially true of fairly prominent visuospatial impairment, which arguably aligns with a parkinsonian presentation more classically than a multifactorial medical/psychiatric presentation. Testing patterns, coupled with tremors and concerns for visual hallucinations, do raise reasonable concerns in my opinion. Continued medical monitoring will be important moving forward in this regard.  Charles [name] was accompanied by his wife during the current feedback session. Content of the current session focused on the results of his neuropsychological evaluation. Charles [name] was given the opportunity to ask questions and his questions were answered. He was encouraged to reach out should additional questions arise. A copy of his report was provided at the conclusion of the visit.      One unit [id number] (31 minutes) was billed for Dr. Arsenio Larger time spent preparing for, conducting, and documenting the current feedback session with Charles [name].

## 2024-04-02 ENCOUNTER — Ambulatory Visit: Attending: Cardiovascular Disease

## 2024-04-02 DIAGNOSIS — I5042 Chronic combined systolic (congestive) and diastolic (congestive) heart failure: Secondary | ICD-10-CM | POA: Diagnosis not present

## 2024-04-02 DIAGNOSIS — Z950 Presence of cardiac pacemaker: Secondary | ICD-10-CM

## 2024-04-05 NOTE — Progress Notes (Signed)
 Remote pacemaker transmission.

## 2024-04-05 NOTE — Addendum Note (Signed)
 Addended by: Edra Govern D on: 04/05/2024 09:54 AM   Modules accepted: Orders

## 2024-04-06 ENCOUNTER — Telehealth: Payer: Self-pay

## 2024-04-06 NOTE — Telephone Encounter (Signed)
 Remote ICM transmission received.  Attempted call to patient regarding ICM remote transmission and left detailed message per DPR.  Left ICM phone number and advised to return call for any fluid symptoms or questions. Next ICM remote transmission scheduled 05/07/2024.

## 2024-04-06 NOTE — Progress Notes (Signed)
 EPIC Encounter for ICM Monitoring  Patient Name: Charles [name] is a 78 y.o. male Date: 04/06/2024 Primary Care Physican: Aloha Arnold, PA-C Primary Cardiologist: Loetta Ringer) [facility] Electrophysiologist: Mealor Bi-V Pacing: 92%  03/30/2023 Office weight: 280 lbs 05/24/2023 Weight:  271 lbs 01/20/2024 Office Weight: 275 lbs   AT/AF Burden: 3.6% (taking Xarelto )                                                          Attempted call to patient and unable to reach.  Left detailed message per DPR regarding transmission.  Transmission results reviewed.      CorVue thoracic impedance suggesting normal fluid levels within the last month.     Prescribed: Furosemide  40 mg take 1 tablet (40 mg total) by mouth daily as needed   Labs: 02/16/2024 Creatinine 1.14, BUN 21, Potassium 4.4, Sodium 137, GFR >60 A complete set of results can be found in Results Review.   Recommendations:  Left voice mail with ICM number and encouraged to call if experiencing any fluid symptoms.   Follow-up plan: ICM clinic phone appointment on 05/07/2024.   91 day device clinic remote transmission 05/21/2024.   EP/Cardiology Office Visits:   Recall 09/08/2024 with Dr Addie Holstein.   Recall 01/23/2025 with EP APP   Copy of ICM check sent to Dr. Arlester Ladd.   3 month ICM trend: 04/02/2024.    12-14 Month ICM trend:     Almyra Jain, RN 04/06/2024 8:21 AM

## 2024-04-18 ENCOUNTER — Other Ambulatory Visit: Payer: Self-pay | Admitting: Cardiovascular Disease

## 2024-04-18 DIAGNOSIS — I480 Paroxysmal atrial fibrillation: Secondary | ICD-10-CM

## 2024-04-19 ENCOUNTER — Ambulatory Visit (INDEPENDENT_AMBULATORY_CARE_PROVIDER_SITE_OTHER): Payer: Medicare Other | Admitting: Endocrinology

## 2024-04-19 ENCOUNTER — Encounter: Payer: Self-pay | Admitting: Endocrinology

## 2024-04-19 ENCOUNTER — Ambulatory Visit: Payer: Self-pay | Admitting: Endocrinology

## 2024-04-19 ENCOUNTER — Other Ambulatory Visit: Payer: Self-pay | Admitting: Endocrinology

## 2024-04-19 ENCOUNTER — Other Ambulatory Visit: Payer: Self-pay

## 2024-04-19 DIAGNOSIS — Z794 Long term (current) use of insulin: Secondary | ICD-10-CM

## 2024-04-19 DIAGNOSIS — E118 Type 2 diabetes mellitus with unspecified complications: Secondary | ICD-10-CM | POA: Diagnosis not present

## 2024-04-19 LAB — POCT GLYCOSYLATED HEMOGLOBIN (HGB A1C): Hemoglobin A1C: 6.6 % — AB (ref 4.0–5.6)

## 2024-04-19 MED ORDER — TRESIBA FLEXTOUCH 100 UNIT/ML ~~LOC~~ SOPN
PEN_INJECTOR | SUBCUTANEOUS | 2 refills | Status: DC
Start: 2024-04-19 — End: 2024-04-19

## 2024-04-19 MED ORDER — INSULIN ASPART 100 UNIT/ML IJ SOLN: INTRAMUSCULAR | 3 refills | Status: DC
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
## 2024-04-19 NOTE — Patient Instructions (Signed)
 Decrease Tresiba  30 units in am.  NovoLog  5 -15 units at  most usually 3 meals a day. Take less of Novolog  in case of exercise or increased physical activity.   Okay to 2-3 units of novolog  for bedtime snack.

## 2024-04-19 NOTE — Telephone Encounter (Signed)
 Prescription refill request for Xarelto  received.  Indication: AF Last office visit: 03/12/24  Electa Grieve MD Weight: 122.4kg Age: 78 Scr: 1.14 on 02/16/24  Epic CrCl: 93.95  Based on above findings Xarelto  20mg  daily is the appropriate dose.  Refill approved.

## 2024-04-20 ENCOUNTER — Ambulatory Visit: Payer: Self-pay | Admitting: Endocrinology

## 2024-04-20 LAB — MICROALBUMIN / CREATININE URINE RATIO
Creatinine, Urine: 71 mg/dL (ref 20–320)
Microalb Creat Ratio: 370 mg/g{creat} — ABNORMAL HIGH
Microalb, Ur: 26.3 mg/dL

## 2024-04-25 ENCOUNTER — Telehealth: Payer: Self-pay | Admitting: Nutrition

## 2024-04-25 NOTE — Telephone Encounter (Signed)
 Patient had a question about new RX of Lantus . RN called and explained MD prescribed in place of Tresiba . No further questions at this time.

## 2024-04-25 NOTE — Telephone Encounter (Signed)
 Message left on my machine that Patient says he picked up the medication at the pharmacy and does not know what it is or how to take it.  Please call him back.

## 2024-04-28 ENCOUNTER — Other Ambulatory Visit: Payer: Self-pay

## 2024-04-28 ENCOUNTER — Emergency Department
Admission: EM | Admit: 2024-04-28 | Discharge: 2024-04-28 | Disposition: A | Discharge status: Home / Self Care | Attending: Emergency Medicine | Admitting: Emergency Medicine

## 2024-04-28 ENCOUNTER — Emergency Department

## 2024-04-28 DIAGNOSIS — Z79899 Other long term (current) drug therapy: Secondary | ICD-10-CM | POA: Diagnosis not present

## 2024-04-28 DIAGNOSIS — I509 Heart failure, unspecified: Secondary | ICD-10-CM | POA: Insufficient documentation

## 2024-04-28 DIAGNOSIS — E1122 Type 2 diabetes mellitus with diabetic chronic kidney disease: Secondary | ICD-10-CM | POA: Diagnosis not present

## 2024-04-28 DIAGNOSIS — Z950 Presence of cardiac pacemaker: Secondary | ICD-10-CM | POA: Diagnosis not present

## 2024-04-28 DIAGNOSIS — R42 Dizziness and giddiness: Secondary | ICD-10-CM | POA: Diagnosis present

## 2024-04-28 DIAGNOSIS — I214 Non-ST elevation (NSTEMI) myocardial infarction: Secondary | ICD-10-CM

## 2024-04-28 DIAGNOSIS — I4891 Unspecified atrial fibrillation: Secondary | ICD-10-CM | POA: Diagnosis not present

## 2024-04-28 DIAGNOSIS — R5383 Other fatigue: Secondary | ICD-10-CM | POA: Diagnosis not present

## 2024-04-28 DIAGNOSIS — R55 Syncope and collapse: Secondary | ICD-10-CM | POA: Diagnosis not present

## 2024-04-28 DIAGNOSIS — E119 Type 2 diabetes mellitus without complications: Secondary | ICD-10-CM | POA: Insufficient documentation

## 2024-04-28 DIAGNOSIS — N189 Chronic kidney disease, unspecified: Secondary | ICD-10-CM | POA: Diagnosis not present

## 2024-04-28 DIAGNOSIS — I2510 Atherosclerotic heart disease of native coronary artery without angina pectoris: Secondary | ICD-10-CM | POA: Diagnosis not present

## 2024-04-28 LAB — DIFFERENTIAL
Abs Immature Granulocytes: 0.02 10*3/uL (ref 0.00–0.07)
Basophils Absolute: 0 10*3/uL (ref 0.0–0.1)
Basophils Relative: 0 %
Eosinophils Absolute: 0.2 10*3/uL (ref 0.0–0.5)
Eosinophils Relative: 3 %
Immature Granulocytes: 0 %
Lymphocytes Relative: 13 %
Lymphs Abs: 1 10*3/uL (ref 0.7–4.0)
Monocytes Absolute: 0.4 10*3/uL (ref 0.1–1.0)
Monocytes Relative: 6 %
Neutro Abs: 5.7 10*3/uL (ref 1.7–7.7)
Neutrophils Relative %: 78 %

## 2024-04-28 LAB — CBC
HCT: 42.7 % (ref 39.0–52.0)
Hemoglobin: 14.2 g/dL (ref 13.0–17.0)
MCH: 30.7 pg (ref 26.0–34.0)
MCHC: 33.3 g/dL (ref 30.0–36.0)
MCV: 92.2 fL (ref 80.0–100.0)
Platelets: 186 10*3/uL (ref 150–400)
RBC: 4.63 MIL/uL (ref 4.22–5.81)
RDW: 13.7 % (ref 11.5–15.5)
WBC: 7.3 10*3/uL (ref 4.0–10.5)
nRBC: 0 % (ref 0.0–0.2)

## 2024-04-28 LAB — COMPREHENSIVE METABOLIC PANEL WITH GFR
ALT: 26 U/L (ref 0–44)
AST: 21 U/L (ref 15–41)
Albumin: 3.7 g/dL (ref 3.5–5.0)
Alkaline Phosphatase: 57 U/L (ref 38–126)
Anion gap: 8 (ref 5–15)
BUN: 24 mg/dL — ABNORMAL HIGH (ref 8–23)
CO2: 22 mmol/L (ref 22–32)
Calcium: 8.6 mg/dL — ABNORMAL LOW (ref 8.9–10.3)
Chloride: 105 mmol/L (ref 98–111)
Creatinine, Ser: 0.99 mg/dL (ref 0.61–1.24)
GFR, Estimated: >60 mL/min (ref >60)
Glucose, Bld: 198 mg/dL — ABNORMAL HIGH (ref 70–99)
Potassium: 4.1 mmol/L (ref 3.5–5.1)
Sodium: 135 mmol/L (ref 135–145)
Total Bilirubin: 1.3 mg/dL — ABNORMAL HIGH (ref 0.0–1.2)
Total Protein: 6.9 g/dL (ref 6.5–8.1)

## 2024-04-28 LAB — ETHANOL: Alcohol, Ethyl (B): <15 mg/dL (ref <15)

## 2024-04-28 LAB — APTT: aPTT: 28 s (ref 24–36)

## 2024-04-28 LAB — PROTIME-INR
INR: 1.1 (ref 0.8–1.2)
Prothrombin Time: 14.9 s (ref 11.4–15.2)

## 2024-04-28 LAB — CBG MONITORING, ED: Glucose-Capillary: 165 mg/dL — ABNORMAL HIGH (ref 70–99)

## 2024-04-28 MED ORDER — SODIUM CHLORIDE 0.9 % IV SOLN: Freq: Once | INTRAVENOUS | Status: AC

## 2024-04-28 NOTE — ED Notes (Signed)
 Interrogation performed by medical tech rep.

## 2024-04-28 NOTE — ED Triage Notes (Signed)
 See first nurse note. Pt to ED [facility] from home, pt was getting up from floor and got dizzy and fell to floor. States before he fell, a black curtain came over his vision (top part, bilateral) and was feeling mildly SOB. States could not get off floor for a couple of minutes and could not move any extremities and also could not figure out how to use his phone (did not understand how). No LOC, did hit side of head. Also states he was feeling "fragile" and "off" then day before and woke up feeling this way today.   Symptoms resolved except pt is still feeling "a little fragile, a little unsteady on my feet". Vision is normal. No arm drift. Speech is clear. Face is symmetrical. Takes Xarelto  for a fib. States had a recent medication change from Trocebo to Lantus . Pt has extensive PMH.

## 2024-04-28 NOTE — ED Triage Notes (Signed)
 First Nurse Note:  Pt via [facility] from home. Pt c/o fall, denies LOC. States had recent changes in DM medications 2 days ago and since then he has been dizzy. Reports he broke his fall with a chair. Pt is A&Ox4 and NAD 175 CBG 171/71 BP  95 HR  96% O2

## 2024-04-28 NOTE — ED Notes (Addendum)
 Called Help line number for pacemaker interrogation [phone]. The representative paged this area's representative to send assistance.

## 2024-04-28 NOTE — ED Notes (Signed)
 Interrogating pacemaker now: [employer]ude abbott.

## 2024-04-28 NOTE — ED Provider Notes (Signed)
 [facility] Provider Note    Event Date/Time   First MD Initiated Contact with Patient 04/28/24 1246     (approximate)   History   Near syncope   HPI  Charles [name] is a 78 y.o. male with a history of diabetes, CHF, CAD, CKD, atrial fibrillation who reports an episode of lightheadedness which occurred yesterday around 1-2 o'clock, he fell backward but did not get injured.  It took him a few minutes to get up.  Has been feeling fatigued since then but otherwise okay.  He had no chest pain no palpitations.  He does have a pacemaker.     Physical Exam   Triage Vital Signs: ED Triage Vitals  Encounter Vitals Group     BP 04/28/24 1155 135/72     Systolic BP Percentile --      Diastolic BP Percentile --      Pulse Rate 04/28/24 1155 70     Resp 04/28/24 1155 20     Temp 04/28/24 1155 98.1 F (36.7 C)     Temp Source 04/28/24 1155 Oral     SpO2 04/28/24 1155 99 %     Weight 04/28/24 1154 122.5 kg (270 lb)     Height 04/28/24 1154 1.753 m (5\' 9" )     Head Circumference --      Peak Flow --      Pain Score 04/28/24 1154 0     Pain Loc --      Pain Education --      Exclude from Growth Chart --     Most recent vital signs: Vitals:   04/28/24 1155 04/28/24 1430  BP: 135/72 96/60  Pulse: 70 72  Resp: 20 15  Temp: 98.1 F (36.7 C)   SpO2: 99% 97%     General: Awake, no distress.  CV:  Good peripheral perfusion.  Resp:  Normal effort.  Abd:  No distention.  Other:     ED Results / Procedures / Treatments   Labs (all labs ordered are listed, but only abnormal results are displayed) Labs Reviewed  COMPREHENSIVE METABOLIC PANEL WITH GFR - Abnormal; Notable for the following components:      Result Value   Glucose, Bld 198 (*)    BUN 24 (*)    Calcium  8.6 (*)    Total Bilirubin 1.3 (*)    All other components within normal limits  CBG MONITORING, ED - Abnormal; Notable for the following components:   Glucose-Capillary 165 (*)     All other components within normal limits  PROTIME-INR  APTT  CBC  DIFFERENTIAL  ETHANOL     EKG  Pacemaker interrogation   RADIOLOGY CT head without acute abnormality    PROCEDURES:  Critical Care performed:   Procedures   MEDICATIONS ORDERED IN ED: Medications  0.9 %  sodium chloride  infusion (0 mLs Intravenous Stopped 04/28/24 1456)     IMPRESSION / MDM / ASSESSMENT AND PLAN / ED COURSE  I reviewed the triage vital signs and the nursing notes. Patient's presentation is most consistent with acute presentation with potential threat to life or bodily function.  Patient presents after near syncopal episode which occurred yesterday, since then he has been feeling fatigue but no further episodes.  He has multiple comorbidities as noted in the chart  Differential includes dehydration, vasovagal syncope, orthostasis.  He does report that he had just gotten off of the couch and was walking, he has multiple medications which could  cause orthostasis  Overall well-appearing today, will check labs, give IV fluids, interrogate pacemaker and reevaluate  Lab work is consistent with mild dehydration, otherwise reassuring.  IV fluids are infusing  Orthostatics are normal  Pending pacemaker interrogation  Have asked my colleague to follow-up on pacemaker interrogation, anticipate discharge if reassuring results        FINAL CLINICAL IMPRESSION(S) / ED DIAGNOSES   Final diagnoses:  Postural dizziness with near syncope     Rx / DC Orders   ED Discharge Orders     None        Note:  This document was prepared using Dragon voice recognition software and may include unintentional dictation errors.   Bryson Carbine, MD 04/28/24 [phone]

## 2024-04-28 NOTE — ED Provider Notes (Signed)
 Emergency department handoff note  Care of this patient was signed out to me at the end of the previous provider shift.  All pertinent patient information was conveyed and all questions were answered.  Patient pending pacemaker interrogation did not show any episodes of malignant arrhythmias as a cause for patient's syncope.  Patient's orthostatic vital signs show mild depression and patient's blood pressure upon standing which points to likely dehydration versus adverse effects of carvedilol .  Patient also states that he was started on a new diabetes medicine that has made him not want to eat and drink as much as usual.  Patient may have an element of dehydration as well as beta-blockade adding to his orthostatic lightheadedness.  Patient did not have any lightheadedness upon standing during orthostatic vital signs and therefore is stable for discharge home at this time to follow-up with his cardiologist for further evaluation and management of his antihypertensives and antiarrhythmics. The patient has been reexamined and is ready to be discharged.  All diagnostic results have been reviewed and discussed with the patient/family.  Care plan has been outlined and the patient/family understands all current diagnoses, results, and treatment plans.  There are no new complaints, changes, or physical findings at this time.  All questions have been addressed and answered.  Patient was instructed to, and agrees to follow-up with their primary care physician as well as return to the emergency department if any new or worsening symptoms develop.   Kinsie Belford K, MD 04/28/24 [phone]

## 2024-04-28 NOTE — ED Notes (Signed)
 EDP in room now.

## 2024-04-30 ENCOUNTER — Telehealth: Payer: Self-pay

## 2024-04-30 NOTE — Telephone Encounter (Signed)
 Patient called after hours line over the weekend stating he thinks he passed out due to Lantus  that was newly changed from Tresiba , however blood glucose was at 157. Patient also mentioned during call it felt like a-fib. After discussing with MD patient called and advised to see his cardiologist due to his symptoms. Did not feel it was related to his insulin . Spoke with wife who stated that he was told recently his carvedilol  dosage may need to be adjusted. Patient to call cardiologist today to follow up.

## 2024-05-03 ENCOUNTER — Encounter: Payer: Self-pay | Admitting: Cardiovascular Disease

## 2024-05-03 NOTE — Telephone Encounter (Signed)
 error

## 2024-05-07 ENCOUNTER — Ambulatory Visit: Attending: Cardiovascular Disease

## 2024-05-07 DIAGNOSIS — I5042 Chronic combined systolic (congestive) and diastolic (congestive) heart failure: Secondary | ICD-10-CM

## 2024-05-07 DIAGNOSIS — Z950 Presence of cardiac pacemaker: Secondary | ICD-10-CM | POA: Diagnosis not present

## 2024-05-09 ENCOUNTER — Telehealth: Payer: Self-pay

## 2024-05-09 NOTE — Progress Notes (Signed)
 EPIC Encounter for ICM Monitoring  Patient Name: Charles [name] is a 78 y.o. male Date: 05/09/2024 Primary Care Physican: Aloha Arnold, PA-C Primary Cardiologist: Loetta Ringer) [facility] Electrophysiologist: Mealor Bi-V Pacing: 82%  03/30/2023 Office weight: 280 lbs 05/24/2023 Weight:  271 lbs 01/20/2024 Office Weight: 275 lbs 03/12/2024 Office Weight: 269 lbs   AT/AF Burden: 0% (taking Xarelto )                                                          Attempted call to patient and unable to reach.  Left detailed message per DPR regarding transmission.  Transmission results reviewed.      CorVue thoracic impedance suggesting normal fluid levels with exception of possible dryness 6/4-6/6 and 6/12-6/14.   Prescribed: Furosemide  40 mg take 1 tablet (40 mg total) by mouth daily as needed   Labs: 04/28/2024 Creatinine 0.99, BUN 24, Potassium 4.1, Sodium 135, GFR >60 02/16/2024 Creatinine 1.14, BUN 21, Potassium 4.4, Sodium 137, GFR >60 A complete set of results can be found in Results Review.   Recommendations:  Left voice mail with ICM number and encouraged to call if experiencing any fluid symptoms.   Follow-up plan: ICM clinic phone appointment on 06/25/2024.   91 day device clinic remote transmission 05/21/2024.   EP/Cardiology Office Visits:   Recall 09/08/2024 with Dr Addie Holstein.   Recall 01/23/2025 with EP APP   Copy of ICM check sent to Dr. Arlester Ladd.   3 month ICM trend: 05/07/2024.    12-14 Month ICM trend:     Almyra Jain, RN 05/09/2024 8:34 AM

## 2024-05-09 NOTE — Telephone Encounter (Signed)
Remote ICM transmission received.  Attempted call to patient regarding ICM remote transmission and left detailed message per DPR.  Left ICM phone number and advised to return call for any fluid symptoms or questions.

## 2024-05-11 ENCOUNTER — Encounter: Payer: Self-pay | Admitting: Emergency Medicine

## 2024-05-11 ENCOUNTER — Ambulatory Visit: Attending: Emergency Medicine | Admitting: Emergency Medicine

## 2024-05-11 VITALS — BP 126/74 | HR 72 | Ht 69.0 in | Wt 262.0 lb

## 2024-05-11 DIAGNOSIS — I480 Paroxysmal atrial fibrillation: Secondary | ICD-10-CM | POA: Insufficient documentation

## 2024-05-11 DIAGNOSIS — I2510 Atherosclerotic heart disease of native coronary artery without angina pectoris: Secondary | ICD-10-CM | POA: Insufficient documentation

## 2024-05-11 DIAGNOSIS — I10 Essential (primary) hypertension: Secondary | ICD-10-CM | POA: Diagnosis not present

## 2024-05-11 DIAGNOSIS — Z950 Presence of cardiac pacemaker: Secondary | ICD-10-CM | POA: Insufficient documentation

## 2024-05-11 DIAGNOSIS — I5020 Unspecified systolic (congestive) heart failure: Secondary | ICD-10-CM | POA: Diagnosis not present

## 2024-05-11 DIAGNOSIS — I951 Orthostatic hypotension: Secondary | ICD-10-CM | POA: Insufficient documentation

## 2024-05-11 DIAGNOSIS — G4733 Obstructive sleep apnea (adult) (pediatric): Secondary | ICD-10-CM | POA: Diagnosis present

## 2024-05-11 NOTE — Patient Instructions (Signed)
 Medication Instructions:  NO CHANGES   Lab Work: BMET TO BE DONE TODAY.  Testing/Procedures: NONE  Follow-Up: At [facility], you and your health needs are our priority.  As part of our continuing mission to provide you with exceptional heart care, our providers are all part of one team.  This team includes your primary Cardiologist (physician) and Advanced Practice Providers or APPs (Physician Assistants and Nurse Practitioners) who all work together to provide you with the care you need, when you need it.  Your next appointment:   6 MONTHS.  Provider:   ESTABLISH AS NEW PATIENT WITH DR. Alvis Ba AND NEW SLEEP PATIENT WITH DR. Micael Adas.

## 2024-05-11 NOTE — Progress Notes (Signed)
 " Cardiology Office Note:    Date:  05/11/2024  ID:  Charles [name], DOB [birth year], MRN [id number] PCP: Charles Lot, PA-C  [facility] Providers Cardiologist:  Charles Sor, MD Electrophysiologist:  Charles FORBES Furbish, MD       Patient Profile:       Chief Complaint: ED follow-up for orthostatic hypotension History of Present Illness:  Charles [name] is a 78 y.o. male with visit-pertinent history of coronary artery disease s/p CABG, proximal atrial fibrillation, sick sinus syndrome s/p PPM, OSA on CPAP, chronic venous insufficiency, GERD, hypertension, hyperlipidemia and type 2 diabetes.  Patient has had known coronary artery disease since 1995 and underwent numerous PTCA's and stent placement to his left circumflex, LAD, and RCA.  He was admitted in September 2017 with NSTEMI treated with PTCA by Dr. Jackolyn to his D1 and mid left circumflex for in-stent restenosis.  LVEF was 35 to 40% at the time.  He underwent repeat cardiac catheterization October 2017 as he has developed in-stent restenosis in each of the 3 sites treated in September and had significant multivessel CAD.  CABG was recommended and he ultimately underwent on 09/15/2016 with LIMA-LAD, SVG-OM, SVG-PDA, SVG-diagonal.  He was hospitalized in May 2019 with acute diastolic and systolic heart failure exacerbation.  Echocardiogram on 03/2018 showed EF 30%, moderate MR.  Follow-up echocardiogram in June 2022 showed EF improved to 55 to 60% without wall motion abnormality, grade 1 DD, dilated acing aorta at 45 mm.  Last echocardiogram March 2023 showed EF 45 to 50%, global hypokinesis, severe biatrial enlargement, mild MR.  He was seen in office on 11/01/2023.  His blood pressure was noted to be low at 90/60.  His carvedilol  was decreased to 6.25 mg twice daily.  He was to follow-up 4 months.  Last seen in clinic on 01/12/2024 by Dr. Furbish, he was doing well at that time and his carvedilol  was increased back to 12.5 mg twice  daily.  He was recently seen in the ED on 04/28/2024 for dizziness with near syncope.  Lab work was consistent with mild dehydration and orthostatics were normal.  He was discharged home in stable condition.  Discussed the use of AI scribe software for clinical note transcription with the patient, who gave verbal consent to proceed.  History of Present Illness Charles [name] is a 78 year old male with orthostatic hypotension who presents with dizziness and orthostatic hypotension.  A recent ER visit on 04/28/2024 was prompted by dizziness and a syncopal episode after standing quickly.  He was standing up from his couch to walk into the kitchen and became dizzy then subsequently had near syncopal episode.  No acute issues were found during the ER evaluation.  He denies any further episodes of syncope or presyncope today.  Dizziness and lightheadedness have resolved.  Orthostatic hypotension is a concern, with blood pressure dropping quickly upon standing, leading to dizziness and fainting. His blood pressure was recorded at 90/60 mmHg in December, resulting in a temporary reduction of carvedilol  to 6.25 mg twice daily, later increased back to 12.5 mg twice daily.  He takes carvedilol  12.5 mg twice daily, aspirin , and Entresto  24-26 mg twice daily. Farxiga  was discontinued. He uses a BPAP machine for sleep apnea.  He does not take his furosemide  as he has been euvolemic.  Fluid intake is low at 8 to 10 ounces daily, significantly below the recommended 64 ounces.  He feels tired and lacks endurance, which he attributes to his  cardiac condition. He exercises using a Bowflex and walks with his dog, avoiding hot weather. No chest pain, shortness of breath, orthopnea, PND or recent atrial fibrillation episodes.  Review of systems:  Please see the history of present illness. All other systems are reviewed and otherwise negative.      Studies Reviewed:    EKG Interpretation Date/Time:  Friday  May 11 2024 09:42:21 EDT Ventricular Rate:  72 PR Interval:  196 QRS Duration:  142 QT Interval:  404 QTC Calculation: 442 R Axis:   133  Text Interpretation: AV dual-paced rhythm with frequent Premature ventricular complexes When compared with ECG of 12-Mar-2024 10:14, Fusion complexes are no longer Present Premature ventricular complexes are now Present Abberant conduction is no longer Present Confirmed by Charles [name] [phone]) on 05/11/2024 9:53:12 AM    Echocardiogram 11/02/2023  1. Left ventricular ejection fraction, by estimation, is 35 to 40%. The  left ventricle has moderately decreased function. The left ventricle  demonstrates global hypokinesis. The left ventricular internal cavity size  was mildly dilated. There is mild  concentric left ventricular hypertrophy. Left ventricular diastolic  parameters are consistent with Grade I diastolic dysfunction (impaired  relaxation).   2. Right ventricular systolic function is mildly reduced. The right  ventricular size is not well visualized. There is normal pulmonary artery  systolic pressure. The estimated right ventricular systolic pressure is  16.1 mmHg.   3. The mitral valve is normal in structure. Trivial mitral valve  regurgitation. No evidence of mitral stenosis.   4. The aortic valve is tricuspid. There is mild calcification of the  aortic valve. Aortic valve regurgitation is not visualized. No aortic  stenosis is present.   5. Aortic dilatation noted. There is moderate dilatation of the ascending  aorta, measuring 45 mm.   6. The inferior vena cava is normal in size with greater than 50%  respiratory variability, suggesting right atrial pressure of 3 mmHg.   7. Technically difficult study with poor acoustic windows.   Risk Assessment/Calculations:    CHA2DS2-VASc Score = 6   This indicates a 9.7% annual risk of stroke. The patient's score is based upon: CHF History: 1 HTN History: 1 Diabetes History: 1 Stroke  History: 0 Vascular Disease History: 1 Age Score: 2 Gender Score: 0             Physical Exam:   VS:  BP 126/74 (BP Location: Left Arm, Patient Position: Sitting, Cuff Size: Large)   Pulse 72   Ht 5' 9 (1.753 m)   Wt 262 lb (118.8 kg)   BMI 38.69 kg/m    Wt Readings from Last 3 Encounters:  05/11/24 262 lb (118.8 kg)  04/28/24 270 lb (122.5 kg)  03/12/24 269 lb 12.8 oz (122.4 kg)    GEN: Well nourished, well developed in no acute distress NECK: No JD; No carotid bruits CARDIAC: RRR, no murmurs, rubs, gallops RESPIRATORY:  Clear to auscultation without rales, wheezing or rhonchi  ABDOMEN: Soft, non-tender, non-distended EXTREMITIES:  No edema; No acute deformity      Assessment and Plan:  Orthostatic hypotension Episode of dizziness with near syncope on 6/7 with ED visit suggesting mild dehydration with normal orthostatics Most recent CorVue thoracic impedance suggesting normal fluid levels with exception of possible dryness on 6/4-6/6 and 6/12-6/14 - Denies any further syncope, presyncope, lightheadedness, dizziness - No orthostasis in office today - Has not been taking his furosemide  as he has been euvolemic - Given result of CorVue he is likely  dehydrated.  He is only drinking 6 to 8 ounces of water daily.  I will have him increase his fluid intake up to 64 ounces daily as his symptoms are likely caused by dehydration - Orthostasis education given - BMET today - If he continues to have difficulty with hypotension plan will be to switch carvedilol  to metoprolol   Coronary artery disease S/p CABG x 4 in 2017.  Last cardiac catheterization in 2017 showed multivessel CAD - Today he is without any anginal symptoms, no indication for further ischemic evaluation this time - Continue aspirin  81 mg daily, carvedilol  12.5 mg twice daily, Zetia  10 mg daily, nitroglycerin  as needed - Not on ASA due to [facility]  HFrEF Echocardiogram 2020 showed LVEF of 30% with moderate MR.  Follow-up  echocardiogram in 2022 showed LVEF 55 to 60%.   Echocardiogram in 2023 with LVEF 45 to 50% His most recent echocardiogram on 10/23/2023 showed LVEF of 35 to 40% - Today he is euvolemic and well compensated on exam.  He is not requiring daily loop diuretic seems to be more dehydrated than anything.  Endorses some fatigue.  No dyspnea, orthopnea, PND.  Leg swelling is well-controlled.  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 Shlomo (previously Dr. Burnard)  T2DM - Follows with endocrinology      Dispo:  Return in about 6 months (around 11/10/2024).  Will have him follow-up with Dr. Francyne.  Former Dr. Burnard patient.  Signed, Lum LITTIE Louis, NP  "

## 2024-05-12 LAB — BASIC METABOLIC PANEL WITH GFR
BUN/Creatinine Ratio: 19 (ref 10–24)
BUN: 21 mg/dL (ref 8–27)
CO2: 19 mmol/L — ABNORMAL LOW (ref 20–29)
Calcium: 8.9 mg/dL (ref 8.6–10.2)
Chloride: 102 mmol/L (ref 96–106)
Creatinine, Ser: 1.11 mg/dL (ref 0.76–1.27)
Glucose: 199 mg/dL — ABNORMAL HIGH (ref 70–99)
Potassium: 4.7 mmol/L (ref 3.5–5.2)
Sodium: 138 mmol/L (ref 134–144)
eGFR: 68 mL/min/{1.73_m2} (ref >59)

## 2024-05-13 ENCOUNTER — Other Ambulatory Visit: Payer: Self-pay | Admitting: Internal Medicine

## 2024-05-14 ENCOUNTER — Ambulatory Visit: Payer: Self-pay | Admitting: Emergency Medicine

## 2024-05-14 ENCOUNTER — Encounter: Payer: Self-pay | Admitting: Emergency Medicine

## 2024-05-21 LAB — CUP PACEART REMOTE DEVICE CHECK
Battery Remaining Longevity: 37 mo
Battery Remaining Percentage: 38 %
Battery Voltage: 2.95 V
Brady Statistic AP VP Percent: 80 %
Brady Statistic AP VS Percent: 4.4 %
Brady Statistic AS VP Percent: 1.1 %
Brady Statistic AS VS Percent: 13 %
Brady Statistic RA Percent Paced: 81 %
Date Time Interrogation Session: 20250630020009
Implantable Lead Connection Status: 753985
Implantable Lead Connection Status: 753985
Implantable Lead Connection Status: 753985
Implantable Lead Implant Date: 20091007
Implantable Lead Implant Date: 20091007
Implantable Lead Implant Date: 20200922
Implantable Lead Location: 753858
Implantable Lead Location: 753859
Implantable Lead Location: 753860
Implantable Pulse Generator Implant Date: 20200922
Lead Channel Impedance Value: 1200 Ohm
Lead Channel Impedance Value: 460 Ohm
Lead Channel Impedance Value: 640 Ohm
Lead Channel Pacing Threshold Amplitude: 0.625 V
Lead Channel Pacing Threshold Amplitude: 0.75 V
Lead Channel Pacing Threshold Amplitude: 1.625 V
Lead Channel Pacing Threshold Pulse Width: 0.4 ms
Lead Channel Pacing Threshold Pulse Width: 0.4 ms
Lead Channel Pacing Threshold Pulse Width: 0.5 ms
Lead Channel Sensing Intrinsic Amplitude: 12 mV
Lead Channel Sensing Intrinsic Amplitude: 3.6 mV
Lead Channel Setting Pacing Amplitude: 1.75 V
Lead Channel Setting Pacing Amplitude: 2 V
Lead Channel Setting Pacing Amplitude: 2.625
Lead Channel Setting Pacing Pulse Width: 0.4 ms
Lead Channel Setting Pacing Pulse Width: 0.5 ms
Lead Channel Setting Sensing Sensitivity: 2 mV
Pulse Gen Model: 3562
Pulse Gen Serial Number: 9157656

## 2024-05-24 ENCOUNTER — Ambulatory Visit (HOSPITAL_BASED_OUTPATIENT_CLINIC_OR_DEPARTMENT_OTHER)

## 2024-05-24 DIAGNOSIS — I6523 Occlusion and stenosis of bilateral carotid arteries: Secondary | ICD-10-CM

## 2024-05-27 ENCOUNTER — Ambulatory Visit (HOSPITAL_BASED_OUTPATIENT_CLINIC_OR_DEPARTMENT_OTHER): Payer: Self-pay | Admitting: Family

## 2024-05-27 ENCOUNTER — Ambulatory Visit: Payer: Self-pay | Admitting: Cardiovascular Disease

## 2024-05-27 DIAGNOSIS — I6523 Occlusion and stenosis of bilateral carotid arteries: Secondary | ICD-10-CM

## 2024-06-01 ENCOUNTER — Emergency Department (HOSPITAL_COMMUNITY)

## 2024-06-01 ENCOUNTER — Other Ambulatory Visit: Payer: Self-pay

## 2024-06-01 ENCOUNTER — Encounter (HOSPITAL_COMMUNITY): Payer: Self-pay | Admitting: *Deleted

## 2024-06-01 ENCOUNTER — Inpatient Hospital Stay (HOSPITAL_COMMUNITY)
Admission: EM | Admit: 2024-06-01 | Discharge: 2024-06-03 | DRG: 312 | Disposition: A | Discharge status: Home / Self Care | Attending: Internal Medicine | Admitting: Internal Medicine

## 2024-06-01 DIAGNOSIS — G4733 Obstructive sleep apnea (adult) (pediatric): Secondary | ICD-10-CM | POA: Diagnosis present

## 2024-06-01 DIAGNOSIS — Z87442 Personal history of urinary calculi: Secondary | ICD-10-CM

## 2024-06-01 DIAGNOSIS — E1122 Type 2 diabetes mellitus with diabetic chronic kidney disease: Secondary | ICD-10-CM | POA: Diagnosis present

## 2024-06-01 DIAGNOSIS — I480 Paroxysmal atrial fibrillation: Secondary | ICD-10-CM | POA: Diagnosis present

## 2024-06-01 DIAGNOSIS — I130 Hypertensive heart and chronic kidney disease with heart failure and stage 1 through stage 4 chronic kidney disease, or unspecified chronic kidney disease: Secondary | ICD-10-CM | POA: Diagnosis present

## 2024-06-01 DIAGNOSIS — R55 Syncope and collapse: Principal | ICD-10-CM | POA: Diagnosis present

## 2024-06-01 DIAGNOSIS — M19042 Primary osteoarthritis, left hand: Secondary | ICD-10-CM | POA: Diagnosis present

## 2024-06-01 DIAGNOSIS — I5022 Chronic systolic (congestive) heart failure: Secondary | ICD-10-CM | POA: Diagnosis present

## 2024-06-01 DIAGNOSIS — E669 Obesity, unspecified: Secondary | ICD-10-CM | POA: Diagnosis present

## 2024-06-01 DIAGNOSIS — Z79899 Other long term (current) drug therapy: Secondary | ICD-10-CM

## 2024-06-01 DIAGNOSIS — G3184 Mild cognitive impairment, so stated: Secondary | ICD-10-CM | POA: Diagnosis present

## 2024-06-01 DIAGNOSIS — E785 Hyperlipidemia, unspecified: Secondary | ICD-10-CM | POA: Diagnosis present

## 2024-06-01 DIAGNOSIS — H9192 Unspecified hearing loss, left ear: Secondary | ICD-10-CM | POA: Diagnosis present

## 2024-06-01 DIAGNOSIS — Z8619 Personal history of other infectious and parasitic diseases: Secondary | ICD-10-CM

## 2024-06-01 DIAGNOSIS — Z860101 Personal history of adenomatous and serrated colon polyps: Secondary | ICD-10-CM

## 2024-06-01 DIAGNOSIS — I872 Venous insufficiency (chronic) (peripheral): Secondary | ICD-10-CM | POA: Diagnosis present

## 2024-06-01 DIAGNOSIS — Z885 Allergy status to narcotic agent status: Secondary | ICD-10-CM

## 2024-06-01 DIAGNOSIS — Z7982 Long term (current) use of aspirin: Secondary | ICD-10-CM

## 2024-06-01 DIAGNOSIS — Z8261 Family history of arthritis: Secondary | ICD-10-CM

## 2024-06-01 DIAGNOSIS — I495 Sick sinus syndrome: Secondary | ICD-10-CM | POA: Diagnosis present

## 2024-06-01 DIAGNOSIS — Z91018 Allergy to other foods: Secondary | ICD-10-CM

## 2024-06-01 DIAGNOSIS — M47816 Spondylosis without myelopathy or radiculopathy, lumbar region: Secondary | ICD-10-CM | POA: Diagnosis present

## 2024-06-01 DIAGNOSIS — Z9109 Other allergy status, other than to drugs and biological substances: Secondary | ICD-10-CM

## 2024-06-01 DIAGNOSIS — Z881 Allergy status to other antibiotic agents status: Secondary | ICD-10-CM

## 2024-06-01 DIAGNOSIS — G894 Chronic pain syndrome: Secondary | ICD-10-CM | POA: Diagnosis present

## 2024-06-01 DIAGNOSIS — N179 Acute kidney failure, unspecified: Secondary | ICD-10-CM | POA: Diagnosis present

## 2024-06-01 DIAGNOSIS — Z950 Presence of cardiac pacemaker: Secondary | ICD-10-CM | POA: Diagnosis not present

## 2024-06-01 DIAGNOSIS — I2489 Other forms of acute ischemic heart disease: Secondary | ICD-10-CM | POA: Diagnosis present

## 2024-06-01 DIAGNOSIS — Z8249 Family history of ischemic heart disease and other diseases of the circulatory system: Secondary | ICD-10-CM

## 2024-06-01 DIAGNOSIS — I951 Orthostatic hypotension: Principal | ICD-10-CM | POA: Diagnosis present

## 2024-06-01 DIAGNOSIS — I2510 Atherosclerotic heart disease of native coronary artery without angina pectoris: Secondary | ICD-10-CM | POA: Diagnosis present

## 2024-06-01 DIAGNOSIS — I252 Old myocardial infarction: Secondary | ICD-10-CM

## 2024-06-01 DIAGNOSIS — Z91040 Latex allergy status: Secondary | ICD-10-CM

## 2024-06-01 DIAGNOSIS — Z9049 Acquired absence of other specified parts of digestive tract: Secondary | ICD-10-CM

## 2024-06-01 DIAGNOSIS — Z7901 Long term (current) use of anticoagulants: Secondary | ICD-10-CM

## 2024-06-01 DIAGNOSIS — I5042 Chronic combined systolic (congestive) and diastolic (congestive) heart failure: Secondary | ICD-10-CM | POA: Diagnosis present

## 2024-06-01 DIAGNOSIS — Z951 Presence of aortocoronary bypass graft: Secondary | ICD-10-CM

## 2024-06-01 DIAGNOSIS — Z6837 Body mass index (BMI) 37.0-37.9, adult: Secondary | ICD-10-CM

## 2024-06-01 DIAGNOSIS — R2681 Unsteadiness on feet: Secondary | ICD-10-CM | POA: Diagnosis present

## 2024-06-01 DIAGNOSIS — I472 Ventricular tachycardia, unspecified: Secondary | ICD-10-CM | POA: Diagnosis present

## 2024-06-01 DIAGNOSIS — I4891 Unspecified atrial fibrillation: Secondary | ICD-10-CM | POA: Diagnosis not present

## 2024-06-01 DIAGNOSIS — K219 Gastro-esophageal reflux disease without esophagitis: Secondary | ICD-10-CM | POA: Diagnosis present

## 2024-06-01 DIAGNOSIS — Z8701 Personal history of pneumonia (recurrent): Secondary | ICD-10-CM

## 2024-06-01 DIAGNOSIS — Z794 Long term (current) use of insulin: Secondary | ICD-10-CM

## 2024-06-01 DIAGNOSIS — Z87891 Personal history of nicotine dependence: Secondary | ICD-10-CM

## 2024-06-01 DIAGNOSIS — M170 Bilateral primary osteoarthritis of knee: Secondary | ICD-10-CM | POA: Diagnosis present

## 2024-06-01 DIAGNOSIS — N183 Chronic kidney disease, stage 3 unspecified: Secondary | ICD-10-CM | POA: Diagnosis present

## 2024-06-01 DIAGNOSIS — E119 Type 2 diabetes mellitus without complications: Secondary | ICD-10-CM

## 2024-06-01 DIAGNOSIS — I255 Ischemic cardiomyopathy: Secondary | ICD-10-CM | POA: Diagnosis present

## 2024-06-01 DIAGNOSIS — J4489 Other specified chronic obstructive pulmonary disease: Secondary | ICD-10-CM | POA: Diagnosis present

## 2024-06-01 DIAGNOSIS — E1142 Type 2 diabetes mellitus with diabetic polyneuropathy: Secondary | ICD-10-CM | POA: Diagnosis present

## 2024-06-01 DIAGNOSIS — Z955 Presence of coronary angioplasty implant and graft: Secondary | ICD-10-CM

## 2024-06-01 DIAGNOSIS — Z888 Allergy status to other drugs, medicaments and biological substances status: Secondary | ICD-10-CM

## 2024-06-01 DIAGNOSIS — I447 Left bundle-branch block, unspecified: Secondary | ICD-10-CM | POA: Diagnosis present

## 2024-06-01 DIAGNOSIS — M19041 Primary osteoarthritis, right hand: Secondary | ICD-10-CM | POA: Diagnosis present

## 2024-06-01 LAB — CBC WITH DIFFERENTIAL/PLATELET
Abs Immature Granulocytes: 0.04 K/uL (ref 0.00–0.07)
Basophils Absolute: 0 K/uL (ref 0.0–0.1)
Basophils Relative: 0 %
Eosinophils Absolute: 0.2 K/uL (ref 0.0–0.5)
Eosinophils Relative: 2 %
HCT: 40.8 % (ref 39.0–52.0)
Hemoglobin: 13.4 g/dL (ref 13.0–17.0)
Immature Granulocytes: 1 %
Lymphocytes Relative: 11 %
Lymphs Abs: 0.9 K/uL (ref 0.7–4.0)
MCH: 31.3 pg (ref 26.0–34.0)
MCHC: 32.8 g/dL (ref 30.0–36.0)
MCV: 95.3 fL (ref 80.0–100.0)
Monocytes Absolute: 0.6 K/uL (ref 0.1–1.0)
Monocytes Relative: 7 %
Neutro Abs: 6.8 K/uL (ref 1.7–7.7)
Neutrophils Relative %: 79 %
Platelets: 201 K/uL (ref 150–400)
RBC: 4.28 MIL/uL (ref 4.22–5.81)
RDW: 14.1 % (ref 11.5–15.5)
WBC: 8.6 K/uL (ref 4.0–10.5)
nRBC: 0 % (ref 0.0–0.2)

## 2024-06-01 LAB — URINALYSIS, ROUTINE W REFLEX MICROSCOPIC
Bilirubin Urine: NEGATIVE
Glucose, UA: NEGATIVE mg/dL
Ketones, ur: NEGATIVE mg/dL
Leukocytes,Ua: NEGATIVE
Nitrite: NEGATIVE
Protein, ur: NEGATIVE mg/dL
Specific Gravity, Urine: 1.008 (ref 1.005–1.030)
pH: 5 (ref 5.0–8.0)

## 2024-06-01 LAB — CBG MONITORING, ED
Glucose-Capillary: 89 mg/dL (ref 70–99)
Glucose-Capillary: 87 mg/dL (ref 70–99)

## 2024-06-01 LAB — TROPONIN I (HIGH SENSITIVITY): Troponin I (High Sensitivity): 22 ng/L — ABNORMAL HIGH (ref <18)

## 2024-06-01 LAB — COMPREHENSIVE METABOLIC PANEL WITH GFR
ALT: 27 U/L (ref 0–44)
AST: 21 U/L (ref 15–41)
Albumin: 3.4 g/dL — ABNORMAL LOW (ref 3.5–5.0)
Alkaline Phosphatase: 53 U/L (ref 38–126)
Anion gap: 12 (ref 5–15)
BUN: 24 mg/dL — ABNORMAL HIGH (ref 8–23)
CO2: 21 mmol/L — ABNORMAL LOW (ref 22–32)
Calcium: 8.6 mg/dL — ABNORMAL LOW (ref 8.9–10.3)
Chloride: 107 mmol/L (ref 98–111)
Creatinine, Ser: 1.34 mg/dL — ABNORMAL HIGH (ref 0.61–1.24)
GFR, Estimated: 55 mL/min — ABNORMAL LOW (ref >60)
Glucose, Bld: 72 mg/dL (ref 70–99)
Potassium: 3.6 mmol/L (ref 3.5–5.1)
Sodium: 140 mmol/L (ref 135–145)
Total Bilirubin: 0.8 mg/dL (ref 0.0–1.2)
Total Protein: 6.5 g/dL (ref 6.5–8.1)

## 2024-06-01 LAB — MAGNESIUM: Magnesium: 2 mg/dL (ref 1.7–2.4)

## 2024-06-01 LAB — BRAIN NATRIURETIC PEPTIDE: B Natriuretic Peptide: 240.8 pg/mL — ABNORMAL HIGH (ref 0.0–100.0)

## 2024-06-01 MED ORDER — ALFUZOSIN HCL ER 10 MG PO TB24
10.0000 mg | ORAL_TABLET | Freq: Every day | ORAL | Status: DC
  Administered 2024-06-02: 10 mg via ORAL
  Filled 2024-06-01 (×2): qty 1

## 2024-06-01 MED ORDER — ACETAMINOPHEN 650 MG RE SUPP: 650.0000 mg | Freq: Four times a day (QID) | RECTAL | Status: DC | PRN

## 2024-06-01 MED ORDER — SACUBITRIL-VALSARTAN 24-26 MG PO TABS: 1.0000 | ORAL_TABLET | Freq: Two times a day (BID) | ORAL | Status: DC

## 2024-06-01 MED ORDER — GABAPENTIN 300 MG PO CAPS
300.0000 mg | ORAL_CAPSULE | Freq: Every day | ORAL | Status: DC
  Administered 2024-06-02 (×2): 300 mg via ORAL
  Filled 2024-06-01 (×2): qty 1

## 2024-06-01 MED ORDER — ONDANSETRON HCL 4 MG PO TABS: 4.0000 mg | ORAL_TABLET | Freq: Four times a day (QID) | ORAL | Status: DC | PRN

## 2024-06-01 MED ORDER — INSULIN GLARGINE-YFGN 100 UNIT/ML ~~LOC~~ SOLN
20.0000 [IU] | Freq: Every day | SUBCUTANEOUS | Status: DC
  Administered 2024-06-02 – 2024-06-03 (×2): 20 [IU] via SUBCUTANEOUS
  Filled 2024-06-01 (×4): qty 0.2

## 2024-06-01 MED ORDER — EZETIMIBE 10 MG PO TABS
10.0000 mg | ORAL_TABLET | Freq: Every day | ORAL | Status: DC
  Administered 2024-06-02 – 2024-06-03 (×2): 10 mg via ORAL
  Filled 2024-06-01 (×2): qty 1

## 2024-06-01 MED ORDER — ACETAMINOPHEN 325 MG PO TABS: 650.0000 mg | ORAL_TABLET | Freq: Four times a day (QID) | ORAL | Status: DC | PRN

## 2024-06-01 MED ORDER — ONDANSETRON HCL 4 MG/2ML IJ SOLN: 4.0000 mg | Freq: Four times a day (QID) | INTRAMUSCULAR | Status: DC | PRN

## 2024-06-01 MED ORDER — RIVAROXABAN 20 MG PO TABS
20.0000 mg | ORAL_TABLET | Freq: Every day | ORAL | Status: DC
  Administered 2024-06-02 – 2024-06-03 (×2): 20 mg via ORAL
  Filled 2024-06-01 (×2): qty 1

## 2024-06-01 MED ORDER — ASPIRIN 81 MG PO TBEC
81.0000 mg | DELAYED_RELEASE_TABLET | Freq: Every day | ORAL | Status: DC
  Administered 2024-06-02 (×2): 81 mg via ORAL
  Filled 2024-06-01 (×2): qty 1

## 2024-06-01 NOTE — ED Triage Notes (Signed)
 Patient presents to ed via [employer] states he wasn't feeling well this am. Santina out to eat breakfast and after eating went to stand up from the table and had a syncopal episode did not fall just sat back down in his chair. No injuries. States same thing happened approx. 3 weeks ago saw his pcp and was told he just stood up to fast.

## 2024-06-01 NOTE — ED Provider Notes (Signed)
 [place] EMERGENCY DEPARTMENT AT [facility] Provider Note   CSN: [id number] Arrival date & time: 06/01/24  1225     Patient presents with: Trauma and Loss of Consciousness   Charles [name] is a 78 y.o. male.   Patient is a 78 year old male who presents with syncopal episode via EMS.  Initially during the end code he was activated as a level 2 trauma.  However upon arrival patient states that he did not fall.  Wife later arrived and states that patient did not have a fall.  #2 activation was canceled.  Patient is on Xarelto .  Denies missing any doses.  Patient states that he went out to breakfast/brunch with his wife.  Did not have anything to eat prior to going out.  He did have his medicines this morning.  Denies any chest pain, shortness of breath, palpitations surrounding the episode.  Wife states that she went to the bathroom and the pain for the food but when she returned paperwork surrounding her husband and he was leaned over in his chair.  He never attempted to get up.  He never fell.  There was no head injury.  He does endorse some nausea surrounding the episode of nausea chronic symptom that has been dealing with for months.  Wife states that they attempted initially to feed him some orange juice but he would not comply.  She states she could not open his eyes, get him to drink the orange juice, or respond in any way.  This lasted for about 5 minutes.  There is no seizure-like activity.  After EMS arrived they took him away.  Wife states at this point he had not responded.  Upon arrival to the emergency department he is alert and oriented.  He received 400 cc of fluid.  The history is provided by the patient. No language interpreter was used.       Prior to Admission medications   Medication Sig Start Date End Date Taking? Authorizing Provider  acetaminophen  (TYLENOL ) 650 MG CR tablet Take 1,300 mg by mouth every 8 (eight) hours as needed (Arthritis).    [provider]  alfuzosin  (UROXATRAL ) 10 MG 24 hr tablet Take 10 mg by mouth every evening.     [provider]  aspirin  EC 81 MG EC tablet Take 1 tablet (81 mg total) by mouth daily. 09/21/16   Gold, Wayne E, PA-C  carvedilol  (COREG ) 12.5 MG tablet Take 1 tablet (12.5 mg total) by mouth 2 (two) times daily. 01/20/24 05/11/24  Mealor, Augustus E, MD  COLLAGEN PO Take 2,500 mg by mouth daily. With vitamin C    [provider]  Continuous Glucose Receiver (DEXCOM G7 RECEIVER) DEVI 1 Device by Does not apply route continuous. 01/26/24   Thapa, [place], MD  Continuous Glucose Sensor (DEXCOM G7 SENSOR) MISC 1 Device by Does not apply route continuous. Change every 10 days. 01/26/24   Thapa, [place], MD  cyanocobalamin 1000 MCG tablet Take 1,000 mcg by mouth daily.    [provider]  docusate sodium  (COLACE) 100 MG capsule TAKE 1 CAPSULE (100 MG TOTAL) BY MOUTH EVERY 12 (TWELVE) HOURS. AS NEEDED 05/13/24   Avram Lupita BRAVO, MD  ezetimibe  (ZETIA ) 10 MG tablet TAKE 1 TABLET BY MOUTH EVERY DAY 01/02/24   Thapa, [place], MD  fluticasone (FLONASE) 50 MCG/ACT nasal spray Place 1 spray into both nostrils daily as needed for allergies or rhinitis.    [provider]  furosemide  (LASIX ) 40  MG tablet Take 1 tablet (40 mg total) by mouth daily as needed for fluid or edema. 07/08/23   Burnard Debby LABOR, MD  gabapentin  (NEURONTIN ) 300 MG capsule Take 1 capsule (300 mg total) by mouth 3 (three) times daily. TAKE 1 CAPSULE BY MOUTH THREE TIMES A DAY 01/09/24   Thapa, [place], MD  Glucagon  (GVOKE HYPOPEN  2-PACK) 1 MG/0.2ML SOAJ Inject to treat severe low blood sugars 06/30/22   [place], MD  insulin  aspart (NOVOLOG ) 100 UNIT/ML injection INJECT 5-15 UNITS UNDER THE SKIN THREE TIMES DAILY BEFORE MEALS. 04/19/24   Thapa, [place], MD  insulin  glargine (LANTUS  SOLOSTAR) 100 UNIT/ML Solostar Pen Inject 30 Units into the skin daily. 04/19/24   Thapa, [place], MD  Insulin  Pen Needle (BD ULTRA-FINE PEN NEEDLES) 29G X  12.7MM MISC USE DAILY TO INJECT TRESIBA  INSULIN  02/03/24   Thapa, [place], MD  Insulin  Syringe-Needle U-100 (BD INSULIN  SYRINGE U/F) 30G X 1/2 0.5 ML MISC USE TO INJECT INSULIN  3 TIMES DAILY 02/03/24   Thapa, [place], MD  latanoprost (XALATAN) 0.005 % ophthalmic solution Place 1 drop into both eyes at bedtime. 08/17/22   [provider]  nitroGLYCERIN  (NITROSTAT ) 0.4 MG SL tablet PLACE 1 TABLET UNDER THE TONGUE EVERY 5 MINUTES AS NEEDED FOR CHEST PAIN 05/10/22   Burnard Debby LABOR, MD  polyethylene glycol (MIRALAX  / GLYCOLAX ) 17 g packet Take 17 g by mouth 2 (two) times daily as needed for severe constipation. 06/02/23   Mesner, Selinda, MD  promethazine  (PHENERGAN ) 25 MG tablet Take 1 tablet by mouth every 8 (eight) hours as needed for nausea. 06/25/19   [provider]  sacubitril -valsartan  (ENTRESTO ) 24-26 MG Take 1 tablet by mouth 2 (two) times daily. 10/05/23   Burnard Debby LABOR, MD  silodosin (RAPAFLO) 8 MG CAPS capsule Take 8 mg by mouth daily. 09/21/23   [provider]  triamcinolone  cream (KENALOG) 0.1 % Apply 1 application. topically 2 (two) times daily as needed (rash). 12/07/21   [provider]  XARELTO  20 MG TABS tablet TAKE 1 TABLET BY MOUTH DAILY WITH SUPPER 04/19/24   Burnard Debby LABOR, MD  zinc  gluconate 50 MG tablet Take 50 mg by mouth daily.    [provider]    Allergies: Black pepper [piper], Chocolate, Codeine , Oxytetracycline, Statins, Flavoring agent, Latex, Other, and Tape    Review of Systems  Constitutional:  Negative for chills and fever.  Respiratory:  Negative for shortness of breath.   Cardiovascular:  Negative for chest pain, palpitations and leg swelling.  Gastrointestinal:  Negative for abdominal pain and nausea.  All other systems reviewed and are negative.   Updated Vital Signs BP 117/70   Pulse 70   Temp 97.6 F (36.4 C) (Oral)   Resp 15   Ht 5' 9 (1.753 m)   Wt 116.6 kg   SpO2 94%   BMI 37.95 kg/m   Physical  Exam Vitals and nursing note reviewed.  Constitutional:      General: He is not in acute distress.    Appearance: Normal appearance. He is not ill-appearing.  HENT:     Head: Normocephalic and atraumatic.     Nose: Nose normal.  Eyes:     Conjunctiva/sclera: Conjunctivae normal.  Pulmonary:     Effort: Pulmonary effort is normal. No respiratory distress.  Abdominal:     General: There is no distension.     Palpations: Abdomen is soft.     Tenderness: There is no abdominal tenderness. There is no guarding.  Musculoskeletal:        General: No deformity.     Right lower leg: No edema.     Left lower leg: No edema.  Skin:    Findings: No rash.  Neurological:     Mental Status: He is alert.     (all labs ordered are listed, but only abnormal results are displayed) Labs Reviewed  CBC WITH DIFFERENTIAL/PLATELET  BRAIN NATRIURETIC PEPTIDE  COMPREHENSIVE METABOLIC PANEL WITH GFR  MAGNESIUM   URINALYSIS, ROUTINE W REFLEX MICROSCOPIC  CBG MONITORING, ED  CBG MONITORING, ED  TROPONIN I (HIGH SENSITIVITY)    EKG: None  Radiology: DG Chest 1 View Result Date: 06/01/2024 CLINICAL DATA:  evaluate for acute process. EXAM: CHEST  1 VIEW COMPARISON:  02/16/2024. FINDINGS: Low lung volume. Bilateral lung fields are clear. Bilateral costophrenic angles are clear. Stable cardio-mediastinal silhouette. There are surgical staples along the heart border and sternotomy wires, status post CABG (coronary artery bypass graft). There is a left sided 3-lead pacemaker. No acute osseous abnormalities. The soft tissues are within normal limits. IMPRESSION: No active disease. Electronically Signed   By: Ree Molt M.D.   On: 06/01/2024 13:12     Procedures   Medications Ordered in the ED - No data to display                                  Medical Decision Making Amount and/or Complexity of Data Reviewed Labs: ordered. Radiology: ordered.   Medical Decision Making / ED Course   This  patient presents to the ED for concern of syncope, this involves an extensive number of treatment options, and is a complaint that carries with it a high risk of complications and morbidity.  The differential diagnosis includes orthostasis, vasovagal, arrhythmia  MDM: 78 year old male presents today for concern of syncopal episode.  Initially presented as a trauma patient but later through history it was determined that patient did not have a fall, and no head injury.  Code trauma was canceled. CBC without leukocytosis or anemia.  CMP shows mild renal insufficiency, slightly elevated BUN.  UA without evidence of UTI.  Mildly elevated troponin, magnesium  within normal, chest x-ray without acute cardiopulmonary process.  EKG without acute ischemic change.  Pacemaker interrogation performed.  No concerning arrhythmia according to the rep.  However he did modify some changes to be proactive.  He decreased the trickle rate from 175-150.  Increase the pacing to 90%.  Given concern for concerning cause of arrhythmia given his history of cardiac disease discussed with hospitalist for admission for syncope workup. Hospitalist will evaluate patient for admission.   Additional history obtained: -Additional history obtained from wife at bedside -External records from outside source obtained and reviewed including: Chart review including previous notes, labs, imaging, consultation notes   Lab Tests: -I ordered, reviewed, and interpreted labs.   The pertinent results include:   Labs Reviewed  CBC WITH DIFFERENTIAL/PLATELET  BRAIN NATRIURETIC PEPTIDE  COMPREHENSIVE METABOLIC PANEL WITH GFR  MAGNESIUM   URINALYSIS, ROUTINE W REFLEX MICROSCOPIC  CBG MONITORING, ED  CBG MONITORING, ED  TROPONIN I (HIGH SENSITIVITY)      EKG  EKG Interpretation Date/Time:    Ventricular Rate:    PR Interval:    QRS Duration:    QT Interval:    QTC Calculation:   R Axis:      Text Interpretation:  Imaging Studies ordered: I ordered imaging studies including chest x-ray I independently visualized and interpreted imaging. I agree with the radiologist interpretation   Medicines ordered and prescription drug management: No orders of the defined types were placed in this encounter.   -I have reviewed the patients home medicines and have made adjustments as needed   Reevaluation: After the interventions noted above, I reevaluated the patient and found that they have :stayed the same  Co morbidities that complicate the patient evaluation  Past Medical History:  Diagnosis Date   Acute cystitis with hematuria 12/23/2016   Acute on chronic systolic and diastolic heart failure, NYHA class 1 03/31/2018   Allergy    Arthritis    knees, hands, lower back (07/29/2016)   Asthma    touch q once & awhile (02/05/2016)   Atrial fibrillation with RVR 10/10/2017   Back pain due to injury 03/31/2018   Bacteremia 12/23/2016   Cardiac pacemaker in situ 07/28/2016   Cardiomyopathy, ischemic    Carpal tunnel syndrome    Cataract    left eye   Cerebrovascular disease    CHF exacerbation 03/31/2018   Chronic bronchitis    Chronic combined systolic and diastolic CHF (congestive heart failure)    Chronic pain syndrome    Chronic venous insufficiency    with prior venous stasis ulcers x 1 2013   CKD (chronic kidney disease), stage III 01/01/2017   Complication of anesthesia    when coming out, I choke and get very restless if breathing tube is still in   Constipation 01/03/2023   Coronary artery disease involving native coronary artery of native heart    a. history of multiple stents to the LCx, LAD, and RCA b. s/p CABG in 08/2016 with LIMA-LAD, SVG-OM, SVG-PDA, and SVG-D1   Diabetic peripheral neuropathy 03/21/2017   Elevated creatine kinase level 2018   Essential hypertension    Gastroesophageal reflux disease without esophagitis 02/05/2016   Hearing loss    Left ear    Helicobacter pylori gastritis 2016   History of adenomatous polyp of colon 02/18/2010   03/2017 diminutive adenoma recall 2023     Hx inflammatort polyp in past  [name], MD, [facility]           History of blood transfusion ~ 2015   related to when they went in to get my kidney stones   History of kidney stones    Hyperlipidemia    Iron deficiency anemia    Kidney stone 01/01/2017   Left bundle branch block (LBBB)    Leg edema    Long term (current) use of anticoagulants 03/10/2013   Mild cognitive impairment of uncertain or unknown etiology 03/19/2024   Nail dystrophy 03/21/2017   Nephrolithiasis    sees [facility], sees every 4 months dr. 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 Now tolerating Zetia  & Livalo  well.  Toenail fungus 03/21/2017   Tremor    Type II diabetes mellitus 02/05/2016   Unstable angina 09/07/2016   URI with cough and congestion 03/31/2018      Dispostion: Discussed with hospitalist will evaluate patient for admission  Final diagnoses:  Syncope and collapse    ED Discharge Orders     None          Hildegard Loge, PA-C 06/01/24 1816    Charlyn Sora, MD 06/03/24 2203
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
## 2024-06-01 NOTE — Progress Notes (Signed)
 Orthopedic Tech Progress Note Patient Details:  Charles [name] [birth year] [id number]  Patient ID: Charles [name] Patient, male   DOB: 11-17-1946, 78 y.o.   MRN: [id number] Responded to level 2 trauma(downgraded), ortho techs currently not needed [name] 06/01/2024, 12:46 PM

## 2024-06-01 NOTE — ED Notes (Signed)
 Please give Rock (wife) a call back for an update

## 2024-06-01 NOTE — ED Notes (Signed)
 CCMD called and notified

## 2024-06-02 DIAGNOSIS — R55 Syncope and collapse: Secondary | ICD-10-CM | POA: Diagnosis present

## 2024-06-02 DIAGNOSIS — E669 Obesity, unspecified: Secondary | ICD-10-CM | POA: Diagnosis present

## 2024-06-02 DIAGNOSIS — Z950 Presence of cardiac pacemaker: Secondary | ICD-10-CM | POA: Diagnosis not present

## 2024-06-02 DIAGNOSIS — G894 Chronic pain syndrome: Secondary | ICD-10-CM | POA: Diagnosis present

## 2024-06-02 DIAGNOSIS — I495 Sick sinus syndrome: Secondary | ICD-10-CM | POA: Diagnosis present

## 2024-06-02 DIAGNOSIS — J4489 Other specified chronic obstructive pulmonary disease: Secondary | ICD-10-CM | POA: Diagnosis present

## 2024-06-02 DIAGNOSIS — Z8249 Family history of ischemic heart disease and other diseases of the circulatory system: Secondary | ICD-10-CM | POA: Diagnosis not present

## 2024-06-02 DIAGNOSIS — Z794 Long term (current) use of insulin: Secondary | ICD-10-CM | POA: Diagnosis not present

## 2024-06-02 DIAGNOSIS — E1142 Type 2 diabetes mellitus with diabetic polyneuropathy: Secondary | ICD-10-CM | POA: Diagnosis present

## 2024-06-02 DIAGNOSIS — I130 Hypertensive heart and chronic kidney disease with heart failure and stage 1 through stage 4 chronic kidney disease, or unspecified chronic kidney disease: Secondary | ICD-10-CM | POA: Diagnosis present

## 2024-06-02 DIAGNOSIS — I2489 Other forms of acute ischemic heart disease: Secondary | ICD-10-CM | POA: Diagnosis present

## 2024-06-02 DIAGNOSIS — I255 Ischemic cardiomyopathy: Secondary | ICD-10-CM | POA: Diagnosis present

## 2024-06-02 DIAGNOSIS — I951 Orthostatic hypotension: Secondary | ICD-10-CM | POA: Diagnosis present

## 2024-06-02 DIAGNOSIS — I5042 Chronic combined systolic (congestive) and diastolic (congestive) heart failure: Secondary | ICD-10-CM | POA: Diagnosis present

## 2024-06-02 DIAGNOSIS — I2510 Atherosclerotic heart disease of native coronary artery without angina pectoris: Secondary | ICD-10-CM | POA: Diagnosis present

## 2024-06-02 DIAGNOSIS — G3184 Mild cognitive impairment, so stated: Secondary | ICD-10-CM | POA: Diagnosis present

## 2024-06-02 DIAGNOSIS — N183 Chronic kidney disease, stage 3 unspecified: Secondary | ICD-10-CM | POA: Diagnosis present

## 2024-06-02 DIAGNOSIS — Z951 Presence of aortocoronary bypass graft: Secondary | ICD-10-CM | POA: Diagnosis not present

## 2024-06-02 DIAGNOSIS — N179 Acute kidney failure, unspecified: Secondary | ICD-10-CM | POA: Diagnosis present

## 2024-06-02 DIAGNOSIS — E785 Hyperlipidemia, unspecified: Secondary | ICD-10-CM | POA: Diagnosis present

## 2024-06-02 DIAGNOSIS — Z87891 Personal history of nicotine dependence: Secondary | ICD-10-CM | POA: Diagnosis not present

## 2024-06-02 DIAGNOSIS — I472 Ventricular tachycardia, unspecified: Secondary | ICD-10-CM | POA: Diagnosis present

## 2024-06-02 DIAGNOSIS — E1122 Type 2 diabetes mellitus with diabetic chronic kidney disease: Secondary | ICD-10-CM | POA: Diagnosis present

## 2024-06-02 DIAGNOSIS — I480 Paroxysmal atrial fibrillation: Secondary | ICD-10-CM | POA: Diagnosis present

## 2024-06-02 DIAGNOSIS — Z7901 Long term (current) use of anticoagulants: Secondary | ICD-10-CM | POA: Diagnosis not present

## 2024-06-02 LAB — BASIC METABOLIC PANEL WITH GFR
Anion gap: 8 (ref 5–15)
BUN: 21 mg/dL (ref 8–23)
CO2: 24 mmol/L (ref 22–32)
Calcium: 8.6 mg/dL — ABNORMAL LOW (ref 8.9–10.3)
Chloride: 106 mmol/L (ref 98–111)
Creatinine, Ser: 1.07 mg/dL (ref 0.61–1.24)
GFR, Estimated: >60 mL/min (ref >60)
Glucose, Bld: 186 mg/dL — ABNORMAL HIGH (ref 70–99)
Potassium: 3.6 mmol/L (ref 3.5–5.1)
Sodium: 138 mmol/L (ref 135–145)

## 2024-06-02 LAB — CBC
HCT: 39.6 % (ref 39.0–52.0)
Hemoglobin: 13.3 g/dL (ref 13.0–17.0)
MCH: 31.1 pg (ref 26.0–34.0)
MCHC: 33.6 g/dL (ref 30.0–36.0)
MCV: 92.7 fL (ref 80.0–100.0)
Platelets: 192 K/uL (ref 150–400)
RBC: 4.27 MIL/uL (ref 4.22–5.81)
RDW: 14 % (ref 11.5–15.5)
WBC: 9.5 K/uL (ref 4.0–10.5)
nRBC: 0 % (ref 0.0–0.2)

## 2024-06-02 LAB — GLUCOSE, CAPILLARY
Glucose-Capillary: 144 mg/dL — ABNORMAL HIGH (ref 70–99)
Glucose-Capillary: 230 mg/dL — ABNORMAL HIGH (ref 70–99)
Glucose-Capillary: 207 mg/dL — ABNORMAL HIGH (ref 70–99)
Glucose-Capillary: 156 mg/dL — ABNORMAL HIGH (ref 70–99)

## 2024-06-02 LAB — TROPONIN I (HIGH SENSITIVITY): Troponin I (High Sensitivity): 23 ng/L — ABNORMAL HIGH (ref <18)

## 2024-06-02 LAB — CBG MONITORING, ED: Glucose-Capillary: 218 mg/dL — ABNORMAL HIGH (ref 70–99)

## 2024-06-02 MED ORDER — POTASSIUM CHLORIDE CRYS ER 20 MEQ PO TBCR
40.0000 meq | EXTENDED_RELEASE_TABLET | Freq: Once | ORAL | Status: AC
  Administered 2024-06-02: 40 meq via ORAL
  Filled 2024-06-02: qty 2

## 2024-06-02 MED ORDER — SODIUM CHLORIDE 0.9 % IV SOLN: INTRAVENOUS | Status: DC

## 2024-06-02 MED ORDER — METOPROLOL TARTRATE 12.5 MG HALF TABLET
12.5000 mg | ORAL_TABLET | Freq: Two times a day (BID) | ORAL | Status: DC
  Administered 2024-06-02 – 2024-06-03 (×3): 12.5 mg via ORAL
  Filled 2024-06-02 (×3): qty 1

## 2024-06-02 MED ORDER — INSULIN ASPART 100 UNIT/ML IJ SOLN
0.0000 [IU] | Freq: Three times a day (TID) | INTRAMUSCULAR | Status: DC
  Administered 2024-06-02: 3 [IU] via SUBCUTANEOUS
  Administered 2024-06-03: 1 [IU] via SUBCUTANEOUS

## 2024-06-02 MED ORDER — POLYETHYLENE GLYCOL 3350 17 G PO PACK: 17.0000 g | PACK | Freq: Every day | ORAL | Status: DC | PRN

## 2024-06-02 NOTE — Evaluation (Signed)
 Physical Therapy Evaluation Patient Details Name: Charles [name] MRN: [id number] DOB: 1946/04/15 Today's Date: 06/02/2024  History of Present Illness  Pt is a 78 y/o male admitted after passing out at a restaurant.  PMHx  Afib with RVR, cardiomyopathy, CHF, chronic pain synd.,CKD, DM, CAD, NSTEMI, OSA, CABG x4  Clinical Impression  Pt admitted with/for above event which has been intermittent lately.  Pt mobilizing at a mod I to S level on eval having not experienced any of the BP issue that got him to the hospital.  Pt currently limited functionally due to the problems listed below.  (see problems list.)  Pt will benefit from PT to maximize function and safety to be able to get home safely with available assist.         If plan is discharge home, recommend the following: Other (comment) (PRN assist)   Can travel by private vehicle        Equipment Recommendations None recommended by PT  Recommendations for Other Services       Functional Status Assessment Patient has had a recent decline in their functional status and demonstrates the ability to make significant improvements in function in a reasonable and predictable amount of time.     Precautions / Restrictions Precautions Precautions: Other (comment) (BP response) Recall of Precautions/Restrictions: Impaired      Mobility  Bed Mobility Overal bed mobility: Modified Independent                  Transfers Overall transfer level: Needs assistance   Transfers: Sit to/from Stand, Bed to chair/wheelchair/BSC Sit to Stand: Supervision Stand pivot transfers: Supervision         General transfer comment: no s/s of dizziness today    Ambulation/Gait Ambulation/Gait assistance: Supervision, Contact guard assist Gait Distance (Feet): 400 Feet Assistive device: IV Pole Gait Pattern/deviations: Step-through pattern   Gait velocity interpretation: 1.31 - 2.62 ft/sec, indicative of limited community ambulator    General Gait Details: generally steady with minor drift L, no dyspnea, no s/s of soft BP's  Stairs            Wheelchair Mobility     Tilt Bed    Modified Rankin (Stroke Patients Only)       Balance Overall balance assessment: Needs assistance Sitting-balance support: No upper extremity supported Sitting balance-Leahy Scale: Good     Standing balance support: No upper extremity supported, During functional activity Standing balance-Leahy Scale: Good Standing balance comment: statically steady at EOB pulling up his underwear                             Pertinent Vitals/Pain Pain Assessment Pain Assessment: Faces Faces Pain Scale: No hurt    Home Living Family/patient expects to be discharged to:: Private residence Living Arrangements: Spouse/significant other Available Help at Discharge: Family;Available 24 hours/day Type of Home: House Home Access: Ramped entrance     Alternate Level Stairs-Number of Steps: flight Home Layout: Two level;Bed/bath upstairs;1/2 bath on main level Home Equipment: BSC/3in1;Cane - single point;Rolling Walker (2 wheels)      Prior Function Prior Level of Function : Independent/Modified Independent;Driving (seldom drives)             Mobility Comments: mod I ADLs Comments: mod I     Extremity/Trunk Assessment   Upper Extremity Assessment Upper Extremity Assessment: Overall WFL for tasks assessed    Lower Extremity Assessment Lower Extremity Assessment: Overall WFL for  tasks assessed    Cervical / Trunk Assessment Cervical / Trunk Assessment: Kyphotic  Communication   Communication Communication: No apparent difficulties    Cognition Arousal: Alert Behavior During Therapy: WFL for tasks assessed/performed   PT - Cognitive impairments: No family/caregiver present to determine baseline                         Following commands: Intact       Cueing Cueing Techniques: Verbal cues      General Comments General comments (skin integrity, edema, etc.): vss    Exercises     Assessment/Plan    PT Assessment Patient needs continued PT services  PT Problem List Decreased balance;Decreased mobility;Decreased activity tolerance;Decreased safety awareness       PT Treatment Interventions Gait training;Stair training;Functional mobility training;Therapeutic activities;Balance training;Patient/family education    PT Goals (Current goals can be found in the Care Plan section)  Acute Rehab PT Goals Patient Stated Goal: figure out the cause of these intermittent BP issues PT Goal Formulation: With patient Time For Goal Achievement: 06/09/24 Potential to Achieve Goals: Good    Frequency Min 2X/week     Co-evaluation               AM-PAC PT 6 Clicks Mobility  Outcome Measure Help needed turning from your back to your side while in a flat bed without using bedrails?: None Help needed moving from lying on your back to sitting on the side of a flat bed without using bedrails?: None Help needed moving to and from a bed to a chair (including a wheelchair)?: A Little Help needed standing up from a chair using your arms (e.g., wheelchair or bedside chair)?: A Little Help needed to walk in hospital room?: A Little Help needed climbing 3-5 steps with a railing? : A Little 6 Click Score: 20    End of Session   Activity Tolerance: Patient tolerated treatment well Patient left: in bed;with call bell/phone within reach;with bed alarm set Nurse Communication: Mobility status PT Visit Diagnosis: Other abnormalities of gait and mobility (R26.89);Other symptoms and signs involving the nervous system (R29.898)    Time: 8251-8184 PT Time Calculation (min) (ACUTE ONLY): 27 min   Charges:   PT Evaluation $PT Eval Low Complexity: 1 Low PT Treatments $Gait Training: 8-22 mins PT General Charges $$ ACUTE PT VISIT: 1 Visit         06/02/2024  [place] HERO., PT Acute  Rehabilitation Services [phone]  (office)  Charles [name] 06/02/2024, 6:31 PM

## 2024-06-02 NOTE — Progress Notes (Signed)
 PROGRESS NOTE    Charles [name]  FMW:[id number] DOB: 05-09-1946 DOA: 06/01/2024 PCP: Montey Lot, PA-C     Brief Narrative:  Charles [name] is a 78 y.o. male with medical history significant for history of CAD with multiple PCI, paroxysmal atrial fibrillation on Eliquis, history of pacemaker and OSA on bipap presents after passing out at a restaurant.  He was at [employer] with his wife, he was at the table feeling fine and the next thing he knew, he woke up in an ambulance.  Patient's wife reported that she had gotten up to pay the check and when she returned, there was small crowd around her husband and he was unresponsive in a chair.  By the time he arrived to the emergency department, patient was alert and back to his baseline.  He did receive IV fluid en route.  He previously saw cardiology NP on 05/11/2024 with orthostatic hypotension.  He had a near syncopal episode on 6/7 with ED visit.  At that time, he had normal orthostatics.  He was recommended to increase his fluid intake and they had considered switching his Coreg  to metoprolol .  New events last 24 hours / Subjective: He was orthostatic positive this morning.  He states that when he stood up, he did not have any dizziness, but did feel unsteady on his feet.  Assessment & Plan:   Principal Problem:   Syncope Active Problems:   Chronic anticoagulation   Type II diabetes mellitus   Cardiac pacemaker in situ   Obstructive sleep apnea   Atrial fibrillation with RVR   Mild cognitive impairment of uncertain or unknown etiology   Syncope Orthostatic hypotension - Pacemaker was interrogated without arrhythmias reported - IVF today  - Hold alfuzosin , entresto   - Coreg  --> lopressor    Demand ischemia - Troponin 22, 23  CAD status post CABG - Aspirin , Zetia   Chronic systolic CHF - Not in acute volume overload state.  Takes Lasix  at home as needed, has not needed it recently  Paroxysmal A-fib - Metoprolol ,  Xarelto   Diabetes mellitus  -Semglee , SSI   OSA - BiPAP nightly   DVT prophylaxis:  rivaroxaban  (XARELTO ) tablet 20 mg  Code Status: Full code Family Communication: No family at bedside Disposition Plan: Home Status is: Observation The patient will require care spanning > 2 midnights and should be moved to inpatient because: IV fluid    Antimicrobials:  Anti-infectives (From admission, onward)    None        Objective: Vitals:   06/02/24 0145 06/02/24 0256 06/02/24 0942 06/02/24 1211  BP: (!) 130/94 (!) 148/88 122/60 134/78  Pulse: 77 78 69 73  Resp: 17 18  18   Temp: 98.5 F (36.9 C) (!) 97.4 F (36.3 C)  98 F (36.7 C)  TempSrc: Oral Oral  Oral  SpO2: 98% 98% 92% 100%  Weight:  115.3 kg    Height:  5' 9 (1.753 m)      Intake/Output Summary (Last 24 hours) at 06/02/2024 1217 Last data filed at 06/02/2024 1015 Gross per 24 hour  Intake 1000 ml  Output 1250 ml  Net -250 ml   Filed Weights   06/01/24 1234 06/02/24 0256  Weight: 116.6 kg 115.3 kg    Examination:  General exam: Appears calm and comfortable  Respiratory system: Clear to auscultation. Respiratory effort normal. No respiratory distress. No conversational dyspnea.  Cardiovascular system: S1 & S2 heard, RRR. No murmurs. No pedal edema. Gastrointestinal system: Abdomen is nondistended, soft  and nontender. Normal bowel sounds heard. Central nervous system: Alert and oriented. No focal neurological deficits. Speech clear.  Extremities: Symmetric in appearance  Skin: No rashes, lesions or ulcers on exposed skin  Psychiatry: Judgement and insight appear normal. Mood & affect appropriate.   Data Reviewed: I have personally reviewed following labs and imaging studies  CBC: Recent Labs  Lab 06/01/24 1258 06/02/24 0436  WBC 8.6 9.5  NEUTROABS 6.8  --   HGB 13.4 13.3  HCT 40.8 39.6  MCV 95.3 92.7  PLT 201 192   Basic Metabolic Panel: Recent Labs  Lab 06/01/24 1258 06/02/24 0436  NA 140  138  K 3.6 3.6  CL 107 106  CO2 21* 24  GLUCOSE 72 186*  BUN 24* 21  CREATININE 1.34* 1.07  CALCIUM  8.6* 8.6*  MG 2.0  --    GFR: Estimated Creatinine Clearance: 72.5 mL/min (by C-G formula based on SCr of 1.07 mg/dL). Liver Function Tests: Recent Labs  Lab 06/01/24 1258  AST 21  ALT 27  ALKPHOS 53  BILITOT 0.8  PROT 6.5  ALBUMIN  3.4*   No results for input(s): LIPASE, AMYLASE in the last 168 hours. No results for input(s): AMMONIA in the last 168 hours. Coagulation Profile: No results for input(s): INR, PROTIME in the last 168 hours. Cardiac Enzymes: No results for input(s): CKTOTAL, CKMB, CKMBINDEX, TROPONINI in the last 168 hours. BNP (last 3 results) No results for input(s): PROBNP in the last 8760 hours. HbA1C: No results for input(s): HGBA1C in the last 72 hours. CBG: Recent Labs  Lab 06/01/24 1242 06/01/24 1503 06/02/24 0025 06/02/24 0748 06/02/24 1214  GLUCAP 89 87 218* 144* 230*   Lipid Profile: No results for input(s): CHOL, HDL, LDLCALC, TRIG, CHOLHDL, LDLDIRECT in the last 72 hours. Thyroid  Function Tests: No results for input(s): TSH, T4TOTAL, FREET4, T3FREE, THYROIDAB in the last 72 hours. Anemia Panel: No results for input(s): VITAMINB12, FOLATE, FERRITIN, TIBC, IRON, RETICCTPCT in the last 72 hours. Sepsis Labs: No results for input(s): PROCALCITON, LATICACIDVEN in the last 168 hours.  No results found for this or any previous visit (from the past 240 hours).    Radiology Studies: DG Chest 1 View Result Date: 06/01/2024 CLINICAL DATA:  evaluate for acute process. EXAM: CHEST  1 VIEW COMPARISON:  02/16/2024. FINDINGS: Low lung volume. Bilateral lung fields are clear. Bilateral costophrenic angles are clear. Stable cardio-mediastinal silhouette. There are surgical staples along the heart border and sternotomy wires, status post CABG (coronary artery bypass graft). There is a left sided  3-lead pacemaker. No acute osseous abnormalities. The soft tissues are within normal limits. IMPRESSION: No active disease. Electronically Signed   By: Ree Molt M.D.   On: 06/01/2024 13:12      Scheduled Meds:  alfuzosin   10 mg Oral QHS   aspirin  EC  81 mg Oral QHS   ezetimibe   10 mg Oral Daily   gabapentin   300 mg Oral QHS   insulin  glargine-yfgn  20 Units Subcutaneous Daily   metoprolol  tartrate  12.5 mg Oral BID   rivaroxaban   20 mg Oral Q breakfast   [START ON 06/03/2024] sacubitril -valsartan   1 tablet Oral BID   Continuous Infusions:  sodium chloride  100 mL/hr at 06/02/24 1129     LOS: 0 days   Time spent: 40 minutes   Delon Hoe, DO [facility] 06/02/2024, 12:17 PM   Available via Epic secure chat 7am-7pm After these hours, please refer to coverage provider listed on [email or web address]

## 2024-06-02 NOTE — Progress Notes (Signed)
   06/02/24 2128  BiPAP/CPAP/SIPAP  Reason BIPAP/CPAP not in use Non-compliant   Patient declined BIPAP while in hospital. Told patient unit would be provided for him if he changed his mind.

## 2024-06-02 NOTE — Progress Notes (Signed)
 [facility] night cross cover note:   I was notified by the patient's RN that telemetry is showing recurrent 8-15 beat runs of nonsustained V. Tach.  Patient asymptomatic with these episodes.  Vital signs otherwise appear stable, including afebrile, most recent systolic blood pressures in the 130s, respiratory rate 18 and oxygen saturation in the mid 90s on room air.  I have ordered stat BMP and stat magnesium  level to further assess.   Update: BMP notable for potassium 3.9. Mag level is 1.8.   In the setting of his recurrent runs of nonsustained V. tach, I have subsequently ordered potassium chloride  20 meq po x 1 dose now as well as magnesium  sulfate 2 g IV every 2 hours.    Eva Pore, DO Hospitalist

## 2024-06-03 DIAGNOSIS — I2489 Other forms of acute ischemic heart disease: Secondary | ICD-10-CM | POA: Insufficient documentation

## 2024-06-03 DIAGNOSIS — N179 Acute kidney failure, unspecified: Secondary | ICD-10-CM | POA: Insufficient documentation

## 2024-06-03 DIAGNOSIS — I480 Paroxysmal atrial fibrillation: Secondary | ICD-10-CM | POA: Insufficient documentation

## 2024-06-03 DIAGNOSIS — I951 Orthostatic hypotension: Secondary | ICD-10-CM | POA: Diagnosis not present

## 2024-06-03 LAB — BASIC METABOLIC PANEL WITH GFR
Anion gap: 8 (ref 5–15)
Anion gap: 6 (ref 5–15)
BUN: 22 mg/dL (ref 8–23)
BUN: 18 mg/dL (ref 8–23)
CO2: 22 mmol/L (ref 22–32)
CO2: 24 mmol/L (ref 22–32)
Calcium: 8.2 mg/dL — ABNORMAL LOW (ref 8.9–10.3)
Calcium: 8.4 mg/dL — ABNORMAL LOW (ref 8.9–10.3)
Chloride: 104 mmol/L (ref 98–111)
Chloride: 106 mmol/L (ref 98–111)
Creatinine, Ser: 1.18 mg/dL (ref 0.61–1.24)
Creatinine, Ser: 1.01 mg/dL (ref 0.61–1.24)
GFR, Estimated: >60 mL/min (ref >60)
GFR, Estimated: >60 mL/min (ref >60)
Glucose, Bld: 206 mg/dL — ABNORMAL HIGH (ref 70–99)
Glucose, Bld: 134 mg/dL — ABNORMAL HIGH (ref 70–99)
Potassium: 3.9 mmol/L (ref 3.5–5.1)
Potassium: 3.7 mmol/L (ref 3.5–5.1)
Sodium: 134 mmol/L — ABNORMAL LOW (ref 135–145)
Sodium: 136 mmol/L (ref 135–145)

## 2024-06-03 LAB — GLUCOSE, CAPILLARY: Glucose-Capillary: 127 mg/dL — ABNORMAL HIGH (ref 70–99)

## 2024-06-03 LAB — MAGNESIUM: Magnesium: 1.8 mg/dL (ref 1.7–2.4)

## 2024-06-03 MED ORDER — MAGNESIUM SULFATE 2 GM/50ML IV SOLN
2.0000 g | Freq: Once | INTRAVENOUS | Status: AC
  Administered 2024-06-03: 2 g via INTRAVENOUS
  Filled 2024-06-03: qty 50

## 2024-06-03 MED ORDER — METOPROLOL TARTRATE 25 MG PO TABS: 12.5000 mg | ORAL_TABLET | Freq: Two times a day (BID) | ORAL | 0 refills | Status: DC

## 2024-06-03 MED ORDER — POTASSIUM CHLORIDE CRYS ER 20 MEQ PO TBCR
20.0000 meq | EXTENDED_RELEASE_TABLET | Freq: Once | ORAL | Status: AC
  Administered 2024-06-03: 20 meq via ORAL
  Filled 2024-06-03: qty 1

## 2024-06-03 NOTE — Progress Notes (Signed)
 Discharge instructions (including medications) discussed with and copy provided to patient and wife. Patient and wife given opportunity to ask questions. Questions clarified.

## 2024-06-03 NOTE — Plan of Care (Signed)

## 2024-06-03 NOTE — Plan of Care (Signed)

## 2024-06-03 NOTE — Progress Notes (Signed)
   06/03/24 0812  Orthostatic Lying   BP- Lying 130/72  Pulse- Lying 70  Orthostatic Sitting  BP- Sitting 134/72  Pulse- Sitting 73  Orthostatic Standing at 0 minutes  BP- Standing at 0 minutes 125/61  Pulse- Standing at 0 minutes 73  Orthostatic Standing at 3 minutes  BP- Standing at 3 minutes 122/56  Pulse- Standing at 3 minutes 71

## 2024-06-03 NOTE — Discharge Summary (Signed)
 Physician Discharge Summary  Charles [name] FMW:[id number] DOB: 24-Oct-1946 DOA: 06/01/2024  PCP: Charles Lot, PA-C  Admit date: 06/01/2024 Discharge date: 06/03/2024  Admitted From: Home Disposition:  Home   Recommendations for Outpatient Follow-up:  Follow up with PCP and cardiology  Discharge Condition: Stable CODE STATUS: Full  Diet recommendation:  Diet Orders (From admission, onward)     Start     Ordered   06/03/24 0000  Diet - low sodium heart healthy        06/03/24 1037   06/01/24 1817  Diet Carb Modified Fluid consistency: Thin; Room service appropriate? Yes; Fluid restriction: 2000 mL Fluid  Diet effective now       Question Answer Comment  Diet-HS Snack? Snack-yes   Calorie Level Medium 1600-2000   Fluid consistency: Thin   Room service appropriate? Yes   Fluid restriction: 2000 mL Fluid      06/01/24 1816           Brief/Interim Summary: Charles [name] is a 78 y.o. male with medical history significant for history of CAD with multiple PCI, paroxysmal atrial fibrillation on Eliquis, history of pacemaker and OSA on bipap presents after passing out at a restaurant.  He was at [employer] with his wife, he was at the table feeling fine and the next thing he knew, he woke up in an ambulance.  Patient's wife reported that she had gotten up to pay the check and when she returned, there was small crowd around her husband and he was unresponsive in a chair.  By the time he arrived to the emergency department, patient was alert and back to his baseline.  He did receive IV fluid en route.   He previously saw cardiology NP on 05/11/2024 with orthostatic hypotension.  He had a near syncopal episode on 6/7 with ED visit.  At that time, he had normal orthostatics.  He was recommended to increase his fluid intake and they had considered switching his Coreg  to metoprolol .  He was admitted for syncopal episode.  He was orthostatic positive this hospitalization.  His  medications including alfuzosin , Entresto  and Coreg  were held and given IV fluid.  Repeat orthostatic vital sign on the morning of discharge was normal.  Patient was feeling well and had no symptoms of syncope prodrome during hospital stay.  He was discharged with Lopressor .  He can resume his Entresto  and hold off alfuzosin  and Coreg .   Discharge Diagnoses:   Principal Problem:   Orthostatic hypotension Active Problems:   Chronic anticoagulation   Type II diabetes mellitus   Cardiac pacemaker in situ   Chronic systolic CHF (congestive heart failure) (HCC)   Obstructive sleep apnea   CAD in native artery   S/P CABG x 4   Mild cognitive impairment of uncertain or unknown etiology   Syncope   Demand ischemia (HCC)   Paroxysmal A-fib (HCC)   AKI (acute kidney injury) [facility])   Discharge Instructions  Discharge Instructions     (HEART FAILURE PATIENTS) Call MD:  Anytime you have any of the following symptoms: 1) 3 pound weight gain in 24 hours or 5 pounds in 1 week 2) shortness of breath, with or without a dry hacking cough 3) swelling in the hands, feet or stomach 4) if you have to sleep on extra pillows at night in order to breathe.   Complete by: As directed    Call MD for:  difficulty breathing, headache or visual disturbances   Complete by: As  directed    Call MD for:  extreme fatigue   Complete by: As directed    Call MD for:  persistant dizziness or light-headedness   Complete by: As directed    Call MD for:  persistant nausea and vomiting   Complete by: As directed    Call MD for:  severe uncontrolled pain   Complete by: As directed    Call MD for:  temperature >100.4   Complete by: As directed    Diet - low sodium heart healthy   Complete by: As directed    Discharge instructions   Complete by: As directed    You were cared for by a hospitalist during your hospital stay. If you have any questions about your discharge medications or the care you received while you were in  the hospital after you are discharged, you can call the unit and ask to speak with the hospitalist on call if the hospitalist that took care of you is not available. Once you are discharged, your primary care physician will handle any further medical issues. Please note that NO REFILLS for any discharge medications will be authorized once you are discharged, as it is imperative that you return to your primary care physician (or establish a relationship with a primary care physician if you do not have one) for your aftercare needs so that they can reassess your need for medications and monitor your lab values.   Increase activity slowly   Complete by: As directed       Allergies as of 06/03/2024       Reactions   Black Pepper [piper] Other (See Comments)   Irritates back of throat   Chocolate Hives, Shortness Of Breath, Swelling   Dark chocolate   Codeine  Itching, Swelling   Oxytetracycline Other (See Comments)   Flushing in sunlight   Statins Other (See Comments)   Mental changes, muscle aches   Flavoring Agent Other (See Comments)   Latex Itching, Other (See Comments)   Sensitive skin   Other Other (See Comments)   Opioids mess up his Stomach   Penicillins    Tape Rash, Other (See Comments)   SKIN IS VERY SENSITIVE!!        Medication List     STOP taking these medications    alfuzosin  10 MG 24 hr tablet Commonly known as: UROXATRAL    carvedilol  12.5 MG tablet Commonly known as: COREG    silodosin 8 MG Caps capsule Commonly known as: RAPAFLO       TAKE these medications    acetaminophen  650 MG CR tablet Commonly known as: TYLENOL  Take 1,300 mg by mouth every 8 (eight) hours as needed (Arthritis).   aspirin  EC 81 MG tablet Take 1 tablet (81 mg total) by mouth daily. What changed: when to take this   BD Insulin  Syringe U/F 30G X 1/2 0.5 ML Misc Generic drug: Insulin  Syringe-Needle U-100 USE TO INJECT INSULIN  3 TIMES DAILY   BD ULTRA-FINE PEN NEEDLES 29G X  12.7MM Misc Generic drug: Insulin  Pen Needle USE DAILY TO INJECT TRESIBA  INSULIN    bismuth subsalicylate 262 MG/15ML suspension Commonly known as: PEPTO BISMOL Take 30 mLs by mouth every 6 (six) hours as needed for indigestion or diarrhea or loose stools.   COLLAGEN PO Take 2,500 mg by mouth in the morning. With vitamin C   cyanocobalamin 1000 MCG tablet Take 1,000 mcg by mouth in the morning.   Dexcom G7 Receiver Devi 1 Device by Does not apply route  continuous.   Dexcom G7 Sensor Misc 1 Device by Does not apply route continuous. Change every 10 days.   docusate sodium  100 MG capsule Commonly known as: COLACE TAKE 1 CAPSULE (100 MG TOTAL) BY MOUTH EVERY 12 (TWELVE) HOURS. AS NEEDED   Entresto  24-26 MG Generic drug: sacubitril -valsartan  Take 1 tablet by mouth 2 (two) times daily.   ezetimibe  10 MG tablet Commonly known as: ZETIA  TAKE 1 TABLET BY MOUTH EVERY DAY What changed: when to take this   fluticasone 50 MCG/ACT nasal spray Commonly known as: FLONASE Place 1 spray into both nostrils daily as needed for allergies or rhinitis.   furosemide  40 MG tablet Commonly known as: LASIX  Take 1 tablet (40 mg total) by mouth daily as needed for fluid or edema.   gabapentin  300 MG capsule Commonly known as: NEURONTIN  Take 1 capsule (300 mg total) by mouth 3 (three) times daily. TAKE 1 CAPSULE BY MOUTH THREE TIMES A DAY   Gvoke HypoPen  2-Pack 1 MG/0.2ML Soaj Generic drug: Glucagon  Inject to treat severe low blood sugars   insulin  aspart 100 UNIT/ML injection Commonly known as: NovoLOG  INJECT 5-15 UNITS UNDER THE SKIN THREE TIMES DAILY BEFORE MEALS.   Lantus  SoloStar 100 UNIT/ML Solostar Pen Generic drug: insulin  glargine Inject 30 Units into the skin daily. What changed: when to take this   latanoprost 0.005 % ophthalmic solution Commonly known as: XALATAN Place 1 drop into both eyes at bedtime.   metoprolol  tartrate 25 MG tablet Commonly known as: LOPRESSOR  Take  0.5 tablets (12.5 mg total) by mouth 2 (two) times daily.   nitroGLYCERIN  0.4 MG SL tablet Commonly known as: NITROSTAT  PLACE 1 TABLET UNDER THE TONGUE EVERY 5 MINUTES AS NEEDED FOR CHEST PAIN What changed: See the new instructions.   polyethylene glycol 17 g packet Commonly known as: MIRALAX  / GLYCOLAX  Take 17 g by mouth 2 (two) times daily as needed for severe constipation.   promethazine  25 MG tablet Commonly known as: PHENERGAN  Take 1 tablet by mouth every 8 (eight) hours as needed for nausea.   triamcinolone  cream 0.1 % Commonly known as: KENALOG Apply 1 application. topically 2 (two) times daily as needed (rash).   Xarelto  20 MG Tabs tablet Generic drug: rivaroxaban  TAKE 1 TABLET BY MOUTH DAILY WITH SUPPER What changed: when to take this   zinc  gluconate 50 MG tablet Take 50 mg by mouth in the morning.        Follow-up Information     Charles Lot, PA-C Follow up.   Specialty: Physician Assistant Contact information: [address] [place] [address] [phone]         Rana Lum CROME, NP. Schedule an appointment as soon as possible for a visit in 1 week(s).   Specialty: Cardiology Why: Or one of Dr. Joesphine partners to check orthostatic hypotension and hospital follow up Contact information: [address] [place] [address] [phone]                Allergies  Allergen Reactions   Black Pepper [Piper] Other (See Comments)    Irritates back of throat   Chocolate Hives, Shortness Of Breath and Swelling    Dark chocolate   Codeine  Itching and Swelling   Oxytetracycline Other (See Comments)    Flushing in sunlight   Statins Other (See Comments)    Mental changes, muscle aches   Flavoring Agent Other (See Comments)   Latex Itching and Other (See Comments)    Sensitive skin  Other Other (See Comments)    Opioids mess up his Stomach   Penicillins    Tape Rash and Other (See Comments)    SKIN IS VERY SENSITIVE!!      Procedures/Studies: DG Chest 1 View Result Date: 06/01/2024 CLINICAL DATA:  evaluate for acute process. EXAM: CHEST  1 VIEW COMPARISON:  02/16/2024. FINDINGS: Low lung volume. Bilateral lung fields are clear. Bilateral costophrenic angles are clear. Stable cardio-mediastinal silhouette. There are surgical staples along the heart border and sternotomy wires, status post CABG (coronary artery bypass graft). There is a left sided 3-lead pacemaker. No acute osseous abnormalities. The soft tissues are within normal limits. IMPRESSION: No active disease. Electronically Signed   By: Ree Molt M.D.   On: 06/01/2024 13:12   VAS US  CAROTID Result Date: 05/24/2024 Carotid Arterial Duplex Study Patient Name:  KIMOTHY KISHIMOTO  Date of Exam:   05/24/2024 Medical Rec #: [id number]         Accession #:    [id number] Date of Birth: [birth year]         Patient Gender: M Patient Age:   51 years Exam Location:  [facility] Procedure:      VAS US  CAROTID Referring Phys: CAITLIN WALKER --------------------------------------------------------------------------------  Indications:       Carotid artery disease and patient denies any cerebrovascular                    symptoms. Risk Factors:      Hyperlipidemia, Diabetes, past history of smoking, prior MI,                    coronary artery disease. Comparison Study:  Prior 06/09/2023 RICA 66/11 cm/s and LICA 55/21 cm/s. Performing Technologist: Orvil Holmes RDCS  Examination Guidelines: A complete evaluation includes B-mode imaging, spectral Doppler, color Doppler, and power Doppler as needed of all accessible portions of each vessel. Bilateral testing is considered an integral part of a complete examination. Limited examinations for reoccurring indications may be performed as noted.  Right Carotid Findings: +----------+--------+--------+--------+------------------+--------+           PSV cm/sEDV cm/sStenosisPlaque DescriptionComments  +----------+--------+--------+--------+------------------+--------+ CCA Prox  84      9                                          +----------+--------+--------+--------+------------------+--------+ CCA Distal51      13                                         +----------+--------+--------+--------+------------------+--------+ ICA Prox  57      18      1-39%   heterogenous               +----------+--------+--------+--------+------------------+--------+ ICA Mid   59      17                                         +----------+--------+--------+--------+------------------+--------+ ICA Distal69      22                                         +----------+--------+--------+--------+------------------+--------+  ECA       66      20                                         +----------+--------+--------+--------+------------------+--------+ +----------+--------+-------+----------------+-------------------+           PSV cm/sEDV cmsDescribe        Arm Pressure (mmHG) +----------+--------+-------+----------------+-------------------+ Subclavian117     8      Multiphasic, WNL                    +----------+--------+-------+----------------+-------------------+ +---------+--------+---+--------+-+---------+ VertebralPSV cm/s117EDV cm/s8Antegrade +---------+--------+---+--------+-+---------+  Left Carotid Findings: +----------+--------+--------+--------+------------------+--------+           PSV cm/sEDV cm/sStenosisPlaque DescriptionComments +----------+--------+--------+--------+------------------+--------+ CCA Prox  114     22                                         +----------+--------+--------+--------+------------------+--------+ CCA Distal65      19                                         +----------+--------+--------+--------+------------------+--------+ ICA Prox  56      24      1-39%   heterogenous                +----------+--------+--------+--------+------------------+--------+ ICA Mid   72      22                                         +----------+--------+--------+--------+------------------+--------+ ICA Distal76      28                                         +----------+--------+--------+--------+------------------+--------+ ECA       99      11                                         +----------+--------+--------+--------+------------------+--------+ +----------+--------+--------+----------------+-------------------+           PSV cm/sEDV cm/sDescribe        Arm Pressure (mmHG) +----------+--------+--------+----------------+-------------------+ Dlarojcpjw43      9       Multiphasic, WNL                    +----------+--------+--------+----------------+-------------------+ +---------+--------+--+--------+--+---------+ VertebralPSV cm/s56EDV cm/s10Antegrade +---------+--------+--+--------+--+---------+   Summary: Right Carotid: Velocities in the right ICA are consistent with a 1-39% stenosis. Left Carotid: Velocities in the left ICA are consistent with a 1-39% stenosis. Vertebrals:  Bilateral vertebral arteries demonstrate antegrade flow. Subclavians: Normal flow hemodynamics were seen in bilateral subclavian              arteries. *See table(s) above for measurements and observations.  Electronically signed by Maude Emmer MD on 05/24/2024 at 1:03:33 PM.    Final    CUP PACEART REMOTE DEVICE CHECK Result Date: 05/21/2024 BIV PPM scheduled remote reviewed. Normal device function.  Presenting rhythm: AP-BVP/AS-VS 4  AMS events. Longest x 8 sec. No EGMs available.  BVP 81%.  C/w previous trends. Next remote 91 days. AB, CVRS      Discharge Exam: Vitals:   06/03/24 0444 06/03/24 0732  BP: (!) 146/89 125/80  Pulse: 78 72  Resp: 18 18  Temp: (!) 97.4 F (36.3 C) (!) 97.3 F (36.3 C)  SpO2: 97%     General: Pt is alert, awake, not in acute distress Cardiovascular:  S1/S2 +, no edema Respiratory: CTA bilaterally, no wheezing, no rhonchi, no respiratory distress, no conversational dyspnea  Abdominal: Soft, NT, ND, bowel sounds + Extremities: no edema, no cyanosis Psych: Normal mood and affect, stable judgement and insight     The results of significant diagnostics from this hospitalization (including imaging, microbiology, ancillary and laboratory) are listed below for reference.     Microbiology: No results found for this or any previous visit (from the past 240 hours).   Labs: BNP (last 3 results) Recent Labs    02/16/24 1501 06/01/24 1258  BNP 407.2* 240.8*   Basic Metabolic Panel: Recent Labs  Lab 06/01/24 1258 06/02/24 0436 06/03/24 0027 06/03/24 0440  NA 140 138 134* 136  K 3.6 3.6 3.9 3.7  CL 107 106 104 106  CO2 21* 24 22 24   GLUCOSE 72 186* 206* 134*  BUN 24* 21 22 18   CREATININE 1.34* 1.07 1.18 1.01  CALCIUM  8.6* 8.6* 8.2* 8.4*  MG 2.0  --  1.8  --    Liver Function Tests: Recent Labs  Lab 06/01/24 1258  AST 21  ALT 27  ALKPHOS 53  BILITOT 0.8  PROT 6.5  ALBUMIN  3.4*   No results for input(s): LIPASE, AMYLASE in the last 168 hours. No results for input(s): AMMONIA in the last 168 hours. CBC: Recent Labs  Lab 06/01/24 1258 06/02/24 0436  WBC 8.6 9.5  NEUTROABS 6.8  --   HGB 13.4 13.3  HCT 40.8 39.6  MCV 95.3 92.7  PLT 201 192   Cardiac Enzymes: No results for input(s): CKTOTAL, CKMB, CKMBINDEX, TROPONINI in the last 168 hours. BNP: Invalid input(s): POCBNP CBG: Recent Labs  Lab 06/02/24 0748 06/02/24 1214 06/02/24 1648 06/02/24 2108 06/03/24 0729  GLUCAP 144* 230* 207* 156* 127*   D-Dimer No results for input(s): DDIMER in the last 72 hours. Hgb A1c No results for input(s): HGBA1C in the last 72 hours. Lipid Profile No results for input(s): CHOL, HDL, LDLCALC, TRIG, CHOLHDL, LDLDIRECT in the last 72 hours. Thyroid  function studies No results for input(s):  TSH, T4TOTAL, T3FREE, THYROIDAB in the last 72 hours.  Invalid input(s): FREET3 Anemia work up No results for input(s): VITAMINB12, FOLATE, FERRITIN, TIBC, IRON, RETICCTPCT in the last 72 hours. Urinalysis    Component Value Date/Time   COLORURINE YELLOW 06/01/2024 1422   APPEARANCEUR CLEAR 06/01/2024 1422   LABSPEC 1.008 06/01/2024 1422   PHURINE 5.0 06/01/2024 1422   GLUCOSEU NEGATIVE 06/01/2024 1422   GLUCOSEU >=1000 (A) 01/16/2020 1034   HGBUR SMALL (A) 06/01/2024 1422   BILIRUBINUR NEGATIVE 06/01/2024 1422   KETONESUR NEGATIVE 06/01/2024 1422   PROTEINUR NEGATIVE 06/01/2024 1422   UROBILINOGEN 0.2 01/16/2020 1034   NITRITE NEGATIVE 06/01/2024 1422   LEUKOCYTESUR NEGATIVE 06/01/2024 1422   Sepsis Labs Recent Labs  Lab 06/01/24 1258 06/02/24 0436  WBC 8.6 9.5   Microbiology No results found for this or any previous visit (from the past 240 hours).   Patient was seen and examined on the day of discharge and was  found to be in stable condition. Time coordinating discharge: 35 minutes including assessment and coordination of care, as well as examination of the patient.   SIGNED:  Delon Hoe, DO [facility] 06/03/2024, 1:18 PM

## 2024-06-03 NOTE — Plan of Care (Signed)
 Problem: Education: Goal: Knowledge of General Education information will improve Description: Including pain rating scale, medication(s)/side effects and non-pharmacologic comfort measures 06/03/2024 0948 by Johnie Will HERO, RN Outcome: Adequate for Discharge 06/03/2024 0859 by Johnie Will HERO, RN Outcome: Progressing   Problem: Health Behavior/Discharge Planning: Goal: Ability to manage health-related needs will improve 06/03/2024 0948 by Johnie Will HERO, RN Outcome: Adequate for Discharge 06/03/2024 0859 by Johnie Will HERO, RN Outcome: Progressing   Problem: Clinical Measurements: Goal: Ability to maintain clinical measurements within normal limits will improve 06/03/2024 0948 by Johnie Will HERO, RN Outcome: Adequate for Discharge 06/03/2024 0859 by Johnie Will HERO, RN Outcome: Progressing Goal: Will remain free from infection 06/03/2024 0948 by Johnie Will HERO, RN Outcome: Adequate for Discharge 06/03/2024 0859 by Johnie Will HERO, RN Outcome: Progressing Goal: Diagnostic test results will improve 06/03/2024 0948 by Johnie Will HERO, RN Outcome: Adequate for Discharge 06/03/2024 0859 by Johnie Will HERO, RN Outcome: Progressing Goal: Respiratory complications will improve 06/03/2024 0948 by Johnie Will HERO, RN Outcome: Adequate for Discharge 06/03/2024 0859 by Johnie Will HERO, RN Outcome: Progressing Goal: Cardiovascular complication will be avoided 06/03/2024 0948 by Johnie Will HERO, RN Outcome: Adequate for Discharge 06/03/2024 0859 by Johnie Will HERO, RN Outcome: Progressing   Problem: Activity: Goal: Risk for activity intolerance will decrease 06/03/2024 0948 by Johnie Will HERO, RN Outcome: Adequate for Discharge 06/03/2024 [phone] by Johnie Will HERO, RN Outcome: Progressing   Problem: Nutrition: Goal: Adequate nutrition will be maintained 06/03/2024 0948 by Johnie Will HERO, RN Outcome: Adequate for  Discharge 06/03/2024 0859 by Johnie Will HERO, RN Outcome: Progressing   Problem: Coping: Goal: Level of anxiety will decrease 06/03/2024 0948 by Johnie Will HERO, RN Outcome: Adequate for Discharge 06/03/2024 0859 by Johnie Will HERO, RN Outcome: Progressing   Problem: Elimination: Goal: Will not experience complications related to bowel motility 06/03/2024 0948 by Johnie Will HERO, RN Outcome: Adequate for Discharge 06/03/2024 0859 by Johnie Will HERO, RN Outcome: Progressing Goal: Will not experience complications related to urinary retention 06/03/2024 0948 by Johnie Will HERO, RN Outcome: Adequate for Discharge 06/03/2024 0859 by Johnie Will HERO, RN Outcome: Progressing   Problem: Pain Managment: Goal: General experience of comfort will improve and/or be controlled 06/03/2024 0948 by Johnie Will HERO, RN Outcome: Adequate for Discharge 06/03/2024 0859 by Johnie Will HERO, RN Outcome: Progressing   Problem: Safety: Goal: Ability to remain free from injury will improve 06/03/2024 0948 by Johnie Will HERO, RN Outcome: Adequate for Discharge 06/03/2024 0859 by Johnie Will HERO, RN Outcome: Progressing   Problem: Skin Integrity: Goal: Risk for impaired skin integrity will decrease 06/03/2024 0948 by Johnie Will HERO, RN Outcome: Adequate for Discharge 06/03/2024 0859 by Johnie Will HERO, RN Outcome: Progressing   Problem: Education: Goal: Ability to describe self-care measures that may prevent or decrease complications (Diabetes Survival Skills Education) will improve 06/03/2024 0948 by Johnie Will HERO, RN Outcome: Adequate for Discharge 06/03/2024 0859 by Johnie Will HERO, RN Outcome: Progressing Goal: Individualized Educational Video(s) 06/03/2024 0948 by Johnie Will HERO, RN Outcome: Adequate for Discharge 06/03/2024 0859 by Johnie Will HERO, RN Outcome: Progressing   Problem: Coping: Goal: Ability to adjust to condition  or change in health will improve 06/03/2024 0948 by Johnie Will HERO, RN Outcome: Adequate for Discharge 06/03/2024 0859 by Johnie Will HERO, RN Outcome: Progressing   Problem: Fluid Volume: Goal: Ability to maintain a balanced intake and output will improve 06/03/2024 0948 by Johnie Will HERO, RN Outcome: Adequate for Discharge 06/03/2024  9140 by Johnie Will HERO, RN Outcome: Progressing   Problem: Health Behavior/Discharge Planning: Goal: Ability to identify and utilize available resources and services will improve 06/03/2024 0948 by Johnie Will HERO, RN Outcome: Adequate for Discharge 06/03/2024 0859 by Johnie Will HERO, RN Outcome: Progressing Goal: Ability to manage health-related needs will improve 06/03/2024 0948 by Johnie Will HERO, RN Outcome: Adequate for Discharge 06/03/2024 0859 by Johnie Will HERO, RN Outcome: Progressing   Problem: Metabolic: Goal: Ability to maintain appropriate glucose levels will improve 06/03/2024 0948 by Johnie Will HERO, RN Outcome: Adequate for Discharge 06/03/2024 0859 by Johnie Will HERO, RN Outcome: Progressing   Problem: Nutritional: Goal: Maintenance of adequate nutrition will improve 06/03/2024 0948 by Johnie Will HERO, RN Outcome: Adequate for Discharge 06/03/2024 0859 by Johnie Will HERO, RN Outcome: Progressing Goal: Progress toward achieving an optimal weight will improve 06/03/2024 0948 by Johnie Will HERO, RN Outcome: Adequate for Discharge 06/03/2024 0859 by Johnie Will HERO, RN Outcome: Progressing   Problem: Skin Integrity: Goal: Risk for impaired skin integrity will decrease 06/03/2024 0948 by Johnie Will HERO, RN Outcome: Adequate for Discharge 06/03/2024 0859 by Johnie Will HERO, RN Outcome: Progressing   Problem: Tissue Perfusion: Goal: Adequacy of tissue perfusion will improve 06/03/2024 0948 by Johnie Will HERO, RN Outcome: Adequate for Discharge 06/03/2024 [phone] by  Johnie Will HERO, RN Outcome: Progressing   Problem: Acute Rehab PT Goals(only PT should resolve) Goal: Pt Will Go Supine/Side To Sit Outcome: Adequate for Discharge Goal: Patient Will Transfer Sit To/From Stand Outcome: Adequate for Discharge Goal: Pt Will Transfer Bed To Chair/Chair To Bed Outcome: Adequate for Discharge Goal: Pt Will Ambulate Outcome: Adequate for Discharge Goal: Pt Will Go Up/Down Stairs Outcome: Adequate for Discharge

## 2024-06-13 ENCOUNTER — Encounter: Payer: Self-pay | Admitting: Physician Assistant

## 2024-06-13 ENCOUNTER — Ambulatory Visit: Attending: Physician Assistant | Admitting: Physician Assistant

## 2024-06-13 VITALS — BP 138/66 | HR 72 | Ht 69.0 in | Wt 254.6 lb

## 2024-06-13 DIAGNOSIS — I25810 Atherosclerosis of coronary artery bypass graft(s) without angina pectoris: Secondary | ICD-10-CM | POA: Diagnosis not present

## 2024-06-13 DIAGNOSIS — G4733 Obstructive sleep apnea (adult) (pediatric): Secondary | ICD-10-CM | POA: Diagnosis present

## 2024-06-13 DIAGNOSIS — E119 Type 2 diabetes mellitus without complications: Secondary | ICD-10-CM | POA: Insufficient documentation

## 2024-06-13 DIAGNOSIS — I10 Essential (primary) hypertension: Secondary | ICD-10-CM | POA: Diagnosis present

## 2024-06-13 DIAGNOSIS — E785 Hyperlipidemia, unspecified: Secondary | ICD-10-CM | POA: Diagnosis present

## 2024-06-13 DIAGNOSIS — I480 Paroxysmal atrial fibrillation: Secondary | ICD-10-CM | POA: Diagnosis present

## 2024-06-13 DIAGNOSIS — Z950 Presence of cardiac pacemaker: Secondary | ICD-10-CM | POA: Diagnosis present

## 2024-06-13 MED ORDER — ASPIRIN 81 MG PO TBEC: 81.0000 mg | DELAYED_RELEASE_TABLET | Freq: Every day | ORAL | Status: AC

## 2024-06-13 MED ORDER — RIVAROXABAN 20 MG PO TABS: 20.0000 mg | ORAL_TABLET | Freq: Every day | ORAL | Status: DC

## 2024-06-13 NOTE — Progress Notes (Unsigned)
 Cardiology Office Note   Date:  06/13/2024  ID:  Gurley, Climer 1946-06-10, MRN [id number] PCP: Montey Lot, PA-C  [facility] Providers Cardiologist:  Debby Sor, MD (Inactive) Electrophysiologist:  Eulas FORBES Furbish, MD { Click to update primary MD,subspecialty MD or APP then REFRESH:1}    History of Present Illness Charles [name] is a 78 y.o. male with past medical history of CAD s/p CABG, PAF, SSS s/p PPM, OSA on BIPAP, chronic venous insufficiency, hypertension, hyperlipidemia, DM2 and GERD.  He had known coronary disease dating back to 80 1995 and underwent numerous PTCA and stent placement to his left circumflex, LAD and RCA.  He was admitted in September 2017 with NSTEMI treated with PTCA of D1 and the mid left circumflex for in-stent restenosis, EF was 35 to 40% at the time.  He underwent repeat cardiac catheterization in October 2017 for in-stent restenosis for each of the 3 sites treated in September and had a significant multivessel CAD.  CABG was recommended and he ultimately underwent CABG x 4 on 09/15/2016 with LIMA-LAD, SVG-OM, SVG-PDA and SVG-diagonal.  He was hospitalized in May 2019 with acute diastolic and systolic heart failure.  Echocardiogram obtained in May 2019 showed EF 30%, moderate MR.  Follow-up echocardiogram in June 2022 showed EF has improved to 55 to 60% without wall motion abnormality, grade 1 DD, dilated aorta measuring at 45 mm.  Repeat echocardiogram in March 2023 showed EF of 45 to 50%, global hypokinesis, severe biatrial enlargement and mild MR.  His blood pressure was low during the office visit in December 2024 with systolic blood pressure down to the 90s, carvedilol  was cut back.  Due to elevated blood pressure, carvedilol  was increased back up to 12.5 mg twice a day by February 2025.  He was seen in the ED in June 2025 for dizziness with near syncope.  Blood work consistent with mild dehydration and orthostatic vital sign was normal.  He  was last seen in June by Lum Organ at which time dizziness and lightheadedness has resolved.  He was instructed to increase fluid hydration.  More recently, patient was admitted to the hospital in July 2025 with syncope.  He is orthostatic vital sign was positive.  Medication including alpha Zosyn, Entresto  and carvedilol  were held.  Repeat orthostatic vital sign at the time of discharge was normal.  He was instructed to resume Entresto  and continue to hold off of alfazosin and carvedilol .  Patient presents today accompanied by wife.  He is to be followed by Dr. Sor who managed his CAD and also BiPAP therapy, however since Dr. Sor retired he is in need of another sleep specialist.  He is BiPAP mask has been leaking for the past several weeks, he has called his equipment company multiple times they were going to send him the new mask in the next week or 2.  I will refer him to pulmonology service for management of BiPAP machine and sleep apnea.  He denies any significant chest discomfort or shortness of breath.  He has been fatigued lately due to loss of sleep.  He has been more somnolent during the daytime and doze off frequently.  Since discharge, he has been placed on low-dose metoprolol  tartrate 12.5 mg twice a day.  Entresto  was ultimately taken off due to hypotension.  He is stable for the cardio perspective.  I asked him to keep a glass of water by the bedside and drink the water in the morning and slowly  get up.  I plan to see the patient back in 2 to 3 months by myself and have him set up with Dr. Mona in 6 months.  Otherwise, he appears to be euvolemic on exam.  ROS: ***  Studies Reviewed      *** Risk Assessment/Calculations {Does this patient have ATRIAL FIBRILLATION?:[id number]}         Physical Exam VS:  BP 138/66   Pulse 72   Ht 5' 9 (1.753 m)   Wt 254 lb 9.6 oz (115.5 kg)   SpO2 93%   BMI 37.60 kg/m        Wt Readings from Last 3 Encounters:  06/13/24 254 lb 9.6  oz (115.5 kg)  06/02/24 254 lb 4.8 oz (115.3 kg)  05/11/24 262 lb (118.8 kg)    GEN: Well nourished, well developed in no acute distress NECK: No JVD; No carotid bruits CARDIAC: ***RRR, no murmurs, rubs, gallops RESPIRATORY:  Clear to auscultation without rales, wheezing or rhonchi  ABDOMEN: Soft, non-tender, non-distended EXTREMITIES:  No edema; No deformity   ASSESSMENT AND PLAN ***    {Are you ordering a CV Procedure (e.g. stress test, cath, DCCV, TEE, etc)?   Press F2        :[id number]}  Dispo: ***  Signed, Scot Ford, PA

## 2024-06-13 NOTE — Patient Instructions (Signed)
 Medication Instructions:  DISCONTINUED ENTRESTO  *If you need a refill on your cardiac medications before your next appointment, please call your pharmacy*  Lab Work: NO LABS If you have labs (blood work) drawn today and your tests are completely normal, you will receive your results only by: MyChart Message (if you have MyChart) OR A paper copy in the mail If you have any lab test that is abnormal or we need to change your treatment, we will call you to review the results.  Testing/Procedures: NO TESTING  Follow-Up: At [facility], you and your health needs are our priority.  As part of our continuing mission to provide you with exceptional heart care, our providers are all part of one team.  This team includes your primary Cardiologist (physician) and Advanced Practice Providers or APPs (Physician Assistants and Nurse Practitioners) who all work together to provide you with the care you need, when you need it.  Your next appointment:   2-3 month(s)  Provider:   Scot Ford, PA-C      Then, Vinie JAYSON Maxcy, MD will plan to see you again in 6 month(s).    We recommend signing up for the patient portal called MyChart.  Sign up information is provided on this After Visit Summary.  MyChart is used to connect with patients for Virtual Visits (Telemedicine).  Patients are able to view lab/test results, encounter notes, upcoming appointments, etc.  Non-urgent messages can be sent to your provider as well.   To learn more about what you can do with MyChart, go to [email or web address].   Other Instructions You have been referred to PULMON[facility]GY.

## 2024-06-18 ENCOUNTER — Encounter: Payer: Self-pay | Admitting: Sleep Medicine

## 2024-06-18 ENCOUNTER — Ambulatory Visit (INDEPENDENT_AMBULATORY_CARE_PROVIDER_SITE_OTHER): Admitting: Sleep Medicine

## 2024-06-18 VITALS — BP 120/88 | HR 40 | Temp 98.3°F | Ht 69.0 in | Wt 256.0 lb

## 2024-06-18 DIAGNOSIS — Z794 Long term (current) use of insulin: Secondary | ICD-10-CM

## 2024-06-18 DIAGNOSIS — G4733 Obstructive sleep apnea (adult) (pediatric): Secondary | ICD-10-CM | POA: Diagnosis not present

## 2024-06-18 DIAGNOSIS — E118 Type 2 diabetes mellitus with unspecified complications: Secondary | ICD-10-CM

## 2024-06-18 DIAGNOSIS — I10 Essential (primary) hypertension: Secondary | ICD-10-CM | POA: Diagnosis not present

## 2024-06-18 DIAGNOSIS — E119 Type 2 diabetes mellitus without complications: Secondary | ICD-10-CM | POA: Diagnosis not present

## 2024-06-18 NOTE — Addendum Note (Signed)
 Addended by: Christianna Belmonte J on: 06/18/2024 11:50 AM   Modules accepted: Orders

## 2024-06-18 NOTE — Progress Notes (Addendum)
 Name:Lena AYCE PIETRZYK MRN: [id number] DOB: [birth year]   CHIEF COMPLAINT:  ESTABLISH CARE FOR OSA   HISTORY OF PRESENT ILLNESS:  Mr. Ginsberg is a 78 y.o. w/ a h/o OSA, DMII, CAD, hyperlipidemia and obesity who presents to establish care for OSA. Reports using PAP therapy every night, compliance data currently unavailable. Reports using the Airfit F30i FFM, which causes air leaks. Reports feeling more refreshed upon awakening with BiPAP therapy. Reports napping for prolonged periods of time. Reports occasional nocturnal awakenings due to nocturia, however does not have difficulty falling back to sleep. Reports a 10 weight loss. Denies morning headaches, RLS symptoms, dream enactment, cataplexy, hypnagogic or hypnapompic hallucinations. Reports a family history of sleep apnea. Denies drowsy driving. Drinks 1 cup of tea and 2-3 sodas daily, denies alcohol or illicit drug use. Former smoker.    Bedtime 12 am Sleep onset 10 mins Rise time 8 am   EPWORTH SLEEP SCORE 20    06/18/2024   10:00 AM  Results of the Epworth flowsheet  Sitting and reading 3  Watching TV 3  Sitting, inactive in a public place (e.g. a theatre or a meeting) 2  As a passenger in a car for an hour without a break 3  Lying down to rest in the afternoon when circumstances permit 3  Sitting and talking to someone 2  Sitting quietly after a lunch without alcohol 3  In a car, while stopped for a few minutes in traffic 1  Total score 20     PAST MEDICAL HISTORY :   has a past medical history of Acute cystitis with hematuria (12/23/2016), Acute on chronic systolic and diastolic heart failure, NYHA class 1 (03/31/2018), Allergy, Arthritis, Asthma, Atrial fibrillation with RVR (10/10/2017), Back pain due to injury (03/31/2018), Bacteremia (12/23/2016), 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), Cardiomyopathy, ischemic, Carpal tunnel syndrome, Cataract, Cerebrovascular disease, CHF exacerbation (03/31/2018), Chronic  bronchitis, Chronic combined systolic and diastolic CHF (congestive heart failure), Chronic pain syndrome, Chronic venous insufficiency, CKD (chronic kidney disease), stage III (01/01/2017), Complication of anesthesia, Constipation (01/03/2023), Coronary artery disease involving native coronary artery of native heart, Diabetic peripheral neuropathy (03/21/2017), Elevated creatine kinase level (2018), Essential hypertension, Gastroesophageal reflux disease without esophagitis (02/05/2016), Hearing loss, Helicobacter pylori gastritis (2016), History of adenomatous polyp of colon (02/18/2010), History of blood transfusion (~ 2015), History of kidney stones, Hyperlipidemia, Iron deficiency anemia, Kidney stone (01/01/2017), Left bundle branch block (LBBB), Leg edema, Long term (current) use of anticoagulants (03/10/2013), Mild cognitive impairment of uncertain or unknown etiology (03/19/2024), Nail dystrophy (03/21/2017), Nephrolithiasis, NSTEMI (non-ST elevated myocardial infarction) (07/28/2016), Obesity (01/03/2008), Obstructive sleep apnea, Pain in right knee (03/13/2018), Pain in right shoulder (03/13/2018), Pain of left hand (05/23/2018), Pain of left hip joint (09/08/2022), Pneumonia (03/2018), Pressure ulcer (04/04/2018), Pyohydronephrosis (01/01/2017), Rotator cuff tear (last 2 years), 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  has a past surgical history that includes Small intestine surgery (1962); Cystoscopy w/ ureteral stent placement (Left, 06/16/2009; 06/26/2009); Esophagogastroduodenoscopy (09/2015); EUS (N/A, 02/06/2016); Cholecystectomy (02/09/2016); Umbilical hernia repair (01/2016); Knee arthroscopy (Bilateral); Knee cartilage surgery (Left, 1980); Cardiac  catheterization; Cardiac catheterization (N/A, 07/29/2016); Cardiac catheterization (N/A, 07/29/2016); Cardiac catheterization (N/A, 09/08/2016); Cardiac catheterization (N/A, 09/08/2016); TEE without cardioversion (N/A, 09/15/2016); Cystoscopy w/ ureteral stent placement (Right, 01/01/2017); Coronary angioplasty with stent (1998 & 2008); Coronary angioplasty  with stent; Coronary angioplasty (07/28/2016); Coronary artery bypass graft (N/A, 09/15/2016); Appendectomy (1962); Ureteroscopy with holmium laser lithotripsy (Right, 01/06/2017); Colonoscopy; Upper gastrointestinal endoscopy; Insert / replace / remove pacemaker (08/2016); Carpal tunnel release (Left, 07/26/2018); Carpal tunnel release (Right, 09/13/2018); BIV PACEMAKER INSERTION CRT-P (N/A, 08/14/2019); Cardioversion (N/A, 02/17/2022); and Cardioversion (N/A, 04/23/2022). Prior to Admission medications   Medication Sig Start Date End Date Taking? Authorizing Provider  acetaminophen  (TYLENOL ) 650 MG CR tablet Take 1,300 mg by mouth every 8 (eight) hours as needed (Arthritis).   Yes [provider]  aspirin  EC 81 MG tablet Take 1 tablet (81 mg total) by mouth at bedtime. 06/13/24  Yes Meng, Hao, PA  bismuth subsalicylate (PEPTO BISMOL) 262 MG/15ML suspension Take 30 mLs by mouth every 6 (six) hours as needed for indigestion or diarrhea or loose stools.   Yes [provider]  COLLAGEN PO Take 2,500 mg by mouth in the morning. With vitamin C   Yes [provider]  Continuous Glucose Receiver (DEXCOM G7 RECEIVER) DEVI 1 Device by Does not apply route continuous. 01/26/24  Yes Thapa, [place], MD  Continuous Glucose Sensor (DEXCOM G7 SENSOR) MISC 1 Device by Does not apply route continuous. Change every 10 days. 01/26/24  Yes Thapa, [place], MD  cyanocobalamin 1000 MCG tablet Take 1,000 mcg by mouth in the morning.   Yes [provider]  docusate sodium  (COLACE) 100 MG capsule TAKE 1 CAPSULE (100 MG TOTAL) BY MOUTH EVERY 12 (TWELVE) HOURS.  AS NEEDED 05/13/24  Yes Avram Lupita BRAVO, MD  erythromycin ophthalmic ointment SMARTSIG:1-2 In Eye(s) 4 Times Daily 05/30/24  Yes [provider]  ezetimibe  (ZETIA ) 10 MG tablet TAKE 1 TABLET BY MOUTH EVERY DAY 01/02/24  Yes Thapa, [place], MD  fluticasone (FLONASE) 50 MCG/ACT nasal spray Place 1 spray into both nostrils daily as needed for allergies or rhinitis.   Yes [provider]  furosemide  (LASIX ) 40 MG tablet Take 1 tablet (40 mg total) by mouth daily as needed for fluid or edema. 07/08/23  Yes Burnard Debby LABOR, MD  gabapentin  (NEURONTIN ) 300 MG capsule Take 1 capsule (300 mg total) by mouth 3 (three) times daily. TAKE 1 CAPSULE BY MOUTH THREE TIMES A DAY 01/09/24  Yes Thapa, [place], MD  Glucagon  (GVOKE HYPOPEN  2-PACK) 1 MG/0.2ML SOAJ Inject to treat severe low blood sugars 06/30/22  Yes Von Pacific, MD  insulin  aspart (NOVOLOG ) 100 UNIT/ML injection INJECT 5-15 UNITS UNDER THE SKIN THREE TIMES DAILY BEFORE MEALS. 04/19/24  Yes Thapa, [place], MD  insulin  glargine (LANTUS  SOLOSTAR) 100 UNIT/ML Solostar Pen Inject 30 Units into the skin daily. Patient taking differently: Inject 30 Units into the skin in the morning. 04/19/24  Yes Thapa, [place], MD  Insulin  Pen Needle (BD ULTRA-FINE PEN NEEDLES) 29G X 12.7MM MISC USE DAILY TO INJECT TRESIBA  INSULIN  02/03/24  Yes Thapa, [place], MD  Insulin  Syringe-Needle U-100 (BD INSULIN  SYRINGE U/F) 30G X 1/2 0.5 ML MISC USE TO INJECT INSULIN  3 TIMES DAILY 02/03/24  Yes Thapa, [place], MD  latanoprost (XALATAN) 0.005 % ophthalmic solution Place 1 drop into both eyes at bedtime. 08/17/22  Yes [provider]  metoprolol  tartrate (LOPRESSOR ) 25 MG tablet Take 0.5 tablets (12.5 mg total) by mouth 2 (two) times daily. 06/03/24  Yes Rojelio Nest, DO  nitroGLYCERIN  (NITROSTAT ) 0.4 MG SL tablet PLACE 1 TABLET UNDER THE TONGUE EVERY 5 MINUTES AS NEEDED FOR CHEST PAIN Patient taking differently: Place 0.4 mg under the tongue every 5 (five) minutes as needed for  chest pain. 05/10/22  Yes Burnard Debby LABOR, MD  polyethylene glycol (MIRALAX  / GLYCOLAX ) 17 g packet Take 17 g by mouth 2 (two) times daily as needed for severe constipation. 06/02/23  Yes Mesner, Selinda, MD  promethazine  (PHENERGAN ) 25 MG tablet Take 1 tablet by mouth every 8 (eight) hours as needed for nausea. 06/25/19  Yes [provider]  rivaroxaban  (XARELTO ) 20 MG TABS tablet Take 1 tablet (20 mg total) by mouth daily before breakfast. 06/13/24  Yes Meng, Hao, PA  triamcinolone  cream (KENALOG) 0.1 % Apply 1 application. topically 2 (two) times daily as needed (rash). 12/07/21  Yes [provider]  zinc  gluconate 50 MG tablet Take 50 mg by mouth in the morning.   Yes [provider]   Allergies  Allergen Reactions   Black Pepper [Piper] Other (See Comments)    Irritates back of throat   Chocolate Hives, Shortness Of Breath and Swelling    Dark chocolate   Codeine  Itching and Swelling   Oxytetracycline Other (See Comments)    Flushing in sunlight   Statins Other (See Comments)    Mental changes, muscle aches   Flavoring Agent Other (See Comments)   Latex Itching and Other (See Comments)    Sensitive skin   Other Other (See Comments)    Opioids mess up his Stomach   Penicillins    Tape Rash and Other (See Comments)    SKIN IS VERY SENSITIVE!!    FAMILY HISTORY:  family history includes Arthritis in his sister; Dementia (age of onset: 41) in his mother; Epilepsy in his brother; Heart attack (age of onset: 5) in his mother; Lung cancer in his maternal grandfather; Neuropathy in his brother; Stroke in his maternal grandmother and paternal grandfather; Tremor in his maternal aunt and mother. SOCIAL HISTORY:  reports that he quit smoking about 49 years ago. His smoking use included cigarettes. He started smoking about 54 years ago. He has a 5 pack-year smoking history. He has never used smokeless tobacco. He reports that he does not drink alcohol and does not use  drugs.   Review of Systems:  Gen:  Denies  fever, sweats, chills weight loss  HEENT: Denies blurred vision, double vision, ear pain, eye pain, hearing loss, nose bleeds, sore throat Cardiac:  No dizziness, chest pain or heaviness, chest tightness,edema, No JVD Resp:   No cough, -sputum production, -shortness of breath,-wheezing, -hemoptysis,  Gi: Denies swallowing difficulty, stomach pain, nausea or vomiting, diarrhea, constipation, bowel incontinence Gu:  Denies bladder incontinence, burning urine Ext:   Denies Joint pain, stiffness or swelling Skin: Denies  skin rash, easy bruising or bleeding or hives Endoc:  Denies polyuria, polydipsia , polyphagia or weight change Psych:   Denies depression, insomnia or hallucinations  Other:  All other systems negative  VITAL SIGNS: BP 120/88 (BP Location: Right Arm, Patient Position: Sitting, Cuff Size: Large)   Pulse (!) 40   Temp 98.3 F (36.8 C) (Oral)   Ht 5' 9 (1.753 m)   Wt 256 lb (116.1 kg)   SpO2 96%   BMI 37.80 kg/m    Physical Examination:   General Appearance: No distress  EYES PERRLA, EOM intact.   NECK Supple, No JVD Pulmonary: normal breath sounds, No wheezing.  CardiovascularNormal S1,S2.  No m/r/g.   Abdomen: Benign, Soft, non-tender. Skin:   warm, no rashes, no ecchymosis  Extremities: normal, no cyanosis, clubbing. Neuro:without focal findings,  speech normal  PSYCHIATRIC: Mood, affect within normal limits.  ASSESSMENT AND PLAN  OSA Patient is using and benefiting from BIPAP therapy. Counseled patient on proper mask fit. Discussed the consequences of untreated sleep apnea. Advised not to drive drowsy for safety of patient and others. Will follow up in 4 weeks.    HTN BP mildly elevated, advised patient to follow up with PCP for further management.  Following with PCP.   DMII Stable, on current management.    Patient  satisfied with Plan of action and management. All questions answered  I spent a  total of 52 minutes reviewing chart data, face-to-face evaluation with the patient, counseling and coordination of care as detailed above.    Keevan Wolz, M.D.  Sleep Medicine [facility]

## 2024-06-18 NOTE — Patient Instructions (Addendum)

## 2024-06-25 ENCOUNTER — Telehealth: Payer: Self-pay

## 2024-06-25 ENCOUNTER — Ambulatory Visit: Attending: Cardiovascular Disease

## 2024-06-25 DIAGNOSIS — I5042 Chronic combined systolic (congestive) and diastolic (congestive) heart failure: Secondary | ICD-10-CM

## 2024-06-25 DIAGNOSIS — Z950 Presence of cardiac pacemaker: Secondary | ICD-10-CM

## 2024-06-25 NOTE — Progress Notes (Signed)
 EPIC Encounter for ICM Monitoring  Patient Name: Charles [name] is a 78 y.o. male Date: 06/25/2024 Primary Care Physican: Montey Lot, PA-C Primary Cardiologist: [place]) [facility] Electrophysiologist: Mealor Bi-V Pacing: 81%  03/30/2023 Office weight: 280 lbs 05/24/2023 Weight:  271 lbs 01/20/2024 Office Weight: 275 lbs 03/12/2024 Office Weight: 269 lbs 06/13/2024 Office Weight: 254.9 lbs   AT/AF Burden: 0% (taking Xarelto )                                                          Attempted call to patient and unable to reach.  Left message to return call.  Transmission results reviewed.   7/11-7/13 hospitalization for syncope and collapse   CorVue thoracic impedance suggesting possible fluid accumulation starting 7/29.   Prescribed: Furosemide  40 mg take 1 tablet (40 mg total) by mouth daily as needed   Labs: 06/03/2024 Creatinine 1.01, BUN 18, Potassium 3.7, Sodium 136, GFR >60  06/03/2024 Creatinine 1.18, BUN 22, Potassium 3.9, Sodium 134, GFR >60  06/02/2024 Creatinine 1.07, BUN 21, Potassium 3.6, Sodium 138, GFR >60  06/01/2024 Creatinine 1.34, BUN 24, Potassium 3.6, Sodium 140, GFR 55 05/11/2024 Creatinine 1.11, BUN 21, Potassium 4.7, Sodium 138 A complete set of results can be found in Results Review.   Recommendations:  Unable to reach.     Follow-up plan: ICM clinic phone appointment on 07/02/2024 to recheck fluid levels.   91 day device clinic remote transmission 08/20/2024.   EP/Cardiology Office Visits:   08/28/2024 with Hao Meng, PA.  Recall 09/08/2024 with Dr Anner.   Recall 01/23/2025 with EP APP   Copy of ICM check sent to Dr. Nancey.   3 month ICM trend: 06/25/2024.    12-14 Month ICM trend:     Mitzie GORMAN Garner, RN 06/25/2024 7:28 AM

## 2024-06-25 NOTE — Telephone Encounter (Signed)
Remote ICM transmission received.  Attempted call to patient regarding ICM remote transmission and left message to return call   

## 2024-06-26 ENCOUNTER — Telehealth: Payer: Self-pay | Admitting: Student

## 2024-06-26 ENCOUNTER — Telehealth: Payer: Self-pay | Admitting: Internal Medicine

## 2024-06-26 MED ORDER — METOPROLOL TARTRATE 25 MG PO TABS: 12.5000 mg | ORAL_TABLET | Freq: Two times a day (BID) | ORAL | 3 refills | Status: DC

## 2024-06-26 NOTE — Progress Notes (Signed)
Attempted return call to patient and no answer.   

## 2024-06-26 NOTE — Telephone Encounter (Signed)
*  STAT* If patient is at the pharmacy, call can be transferred to refill team.   1. Which medications need to be refilled? (please list name of each medication and dose if known) metoprolol tartrate (LOPRESSOR) 25 MG tablet  2. Which pharmacy/location (including street and city if local pharmacy) is medication to be sent to? [id number] - [place], [place] - [address] [facility] [address] [facility]  3. Do they need a 30 day or 90 day supply? 90  

## 2024-06-26 NOTE — Telephone Encounter (Signed)
 The patients wife called the after-hours answering service requesting a refill of the patient's metoprolol  12.5 mg twice daily.  The patient had called earlier and there is a prior note about this call but it appears like the refill was not sent in yet.  Confirmed with the patient's wife that this is the correct pharmacy.

## 2024-06-26 NOTE — Telephone Encounter (Signed)
 The [facility] the earlier [facility] prescription was sent to called the after-hours call line.  They reported that they had not got the prescription for the metoprolol .  Verbal orders were given to fill the metoprolol  that matched the earlier e-prescription.   SignedMorse Clause, PA-C 06/26/2024, 6:57 PM

## 2024-06-27 NOTE — Telephone Encounter (Signed)
 RX sent in by a different provider

## 2024-06-30 ENCOUNTER — Other Ambulatory Visit: Payer: Self-pay | Admitting: Endocrinology

## 2024-06-30 DIAGNOSIS — Z794 Long term (current) use of insulin: Secondary | ICD-10-CM

## 2024-07-02 ENCOUNTER — Telehealth: Payer: Self-pay

## 2024-07-02 ENCOUNTER — Ambulatory Visit: Attending: Cardiovascular Disease

## 2024-07-02 DIAGNOSIS — I5042 Chronic combined systolic (congestive) and diastolic (congestive) heart failure: Secondary | ICD-10-CM

## 2024-07-02 DIAGNOSIS — Z950 Presence of cardiac pacemaker: Secondary | ICD-10-CM

## 2024-07-02 NOTE — Progress Notes (Signed)
 EPIC Encounter for ICM Monitoring  Patient Name: Charles [name] is a 78 y.o. male Date: 07/02/2024 Primary Care Physican: Montey Lot, PA-C Primary Cardiologist: [facility] replacing Dr Burnard Electrophysiologist: Mealor Bi-V Pacing: 81%  03/30/2023 Office weight: 280 lbs 05/24/2023 Weight:  271 lbs 01/20/2024 Office Weight: 275 lbs 03/12/2024 Office Weight: 269 lbs 06/13/2024 Office Weight: 254.9 lbs   AT/AF Burden: 0% (taking Xarelto )                                                          Attempted call to patient and unable to reach.  Left message to return call.  Transmission results reviewed.      CorVue thoracic impedance suggesting possible fluid accumulation starting 7/29.   Prescribed: Furosemide  40 mg take 1 tablet (40 mg total) by mouth daily as needed   Labs: 06/03/2024 Creatinine 1.01, BUN 18, Potassium 3.7, Sodium 136, GFR >60  06/03/2024 Creatinine 1.18, BUN 22, Potassium 3.9, Sodium 134, GFR >60  06/02/2024 Creatinine 1.07, BUN 21, Potassium 3.6, Sodium 138, GFR >60  06/01/2024 Creatinine 1.34, BUN 24, Potassium 3.6, Sodium 140, GFR 55 05/11/2024 Creatinine 1.11, BUN 21, Potassium 4.7, Sodium 138 A complete set of results can be found in Results Review.   Recommendations:  Unable to reach.  Will advise to take PRN Lasix  is patient is reached.    Follow-up plan: ICM clinic phone appointment on 07/09/2024 to recheck fluid levels.   91 day device clinic remote transmission 08/20/2024.   EP/Cardiology Office Visits:   08/28/2024 with Scot Ford, PA.  10/04/2024 with Dr Mona (1st visit).  Recall 01/23/2025 with EP APP   Copy of ICM check sent to Dr. Nancey.   3 month ICM trend: 07/02/2024.    12-14 Month ICM trend:    Mitzie GORMAN Garner, RN 07/02/2024 4:23 PM

## 2024-07-02 NOTE — Telephone Encounter (Signed)
Remote ICM transmission received.  Attempted call to patient regarding ICM remote transmission and left detailed message per DPR.  Left ICM phone number and advised to return call for any fluid symptoms or questions.

## 2024-07-09 ENCOUNTER — Telehealth: Payer: Self-pay

## 2024-07-09 ENCOUNTER — Ambulatory Visit: Attending: Cardiovascular Disease

## 2024-07-09 DIAGNOSIS — Z950 Presence of cardiac pacemaker: Secondary | ICD-10-CM

## 2024-07-09 DIAGNOSIS — I5042 Chronic combined systolic (congestive) and diastolic (congestive) heart failure: Secondary | ICD-10-CM

## 2024-07-09 NOTE — Progress Notes (Signed)
 EPIC Encounter for ICM Monitoring  Patient Name: Charles [name] is a 78 y.o. male Date: 07/09/2024 Primary Care Physican: Montey Lot, PA-C Primary Cardiologist: [place]) [facility] Electrophysiologist: Mealor Bi-V Pacing: 80%  03/30/2023 Office weight: 280 lbs 05/24/2023 Weight:  271 lbs 01/20/2024 Office Weight: 275 lbs 03/12/2024 Office Weight: 269 lbs 06/13/2024 Office Weight: 254.9 lbs   AT/AF Burden: 0% (taking Xarelto )                                                          Attempted call to patient and unable to reach.  Left message to return call.  Transmission results reviewed.      CorVue thoracic impedance suggesting possible fluid accumulation starting 7/29 and returned to baseline 8/14.   Prescribed: Furosemide  40 mg take 1 tablet (40 mg total) by mouth daily as needed   Labs: 06/03/2024 Creatinine 1.01, BUN 18, Potassium 3.7, Sodium 136, GFR >60  06/03/2024 Creatinine 1.18, BUN 22, Potassium 3.9, Sodium 134, GFR >60  06/02/2024 Creatinine 1.07, BUN 21, Potassium 3.6, Sodium 138, GFR >60  06/01/2024 Creatinine 1.34, BUN 24, Potassium 3.6, Sodium 140, GFR 55 05/11/2024 Creatinine 1.11, BUN 21, Potassium 4.7, Sodium 138 A complete set of results can be found in Results Review.   Recommendations:  Unable to reach.     Follow-up plan: ICM clinic phone appointment on 08/06/2024.   91 day device clinic remote transmission 08/20/2024.   EP/Cardiology Office Visits:   08/28/2024 with Hao Meng, PA.  Recall 09/08/2024 with Dr Anner.   Recall 01/23/2025 with EP APP   Copy of ICM check sent to Dr. Nancey.   3 month ICM trend: 07/09/2024.    12-14 Month ICM trend:     Mitzie GORMAN Garner, RN 07/09/2024 4:29 PM

## 2024-07-09 NOTE — Telephone Encounter (Signed)
Remote ICM transmission received.  Attempted call to patient regarding ICM remote transmission and left message to return call   

## 2024-07-10 NOTE — Progress Notes (Signed)
 Attempted call to patient and unable to reach.  Left detailed message per DPR regarding transmission.  Transmission results reviewed.

## 2024-07-11 ENCOUNTER — Other Ambulatory Visit: Payer: Self-pay | Admitting: Internal Medicine

## 2024-07-11 NOTE — Progress Notes (Signed)
 Spoke with patient and wife per DPR.  heart failure questions reviewed.  Transmission results reviewed.  Pt did not have any fluid symptoms during decreased impedance and is feeling okay.   He was admitted to hospital for syncopal episode per discharge summary.  Also was orthostatic positive this hospitalization.  Hospitalized 7/11-7/13.  Pt had hospital follow up with Hao Meng, PA on 7/23.   Pt will be seeing Dr Mona since Dr Burnard has retired.  His first appt is 10/04/2024 but wife would like an earlier appointment.  She is getting more involved in patients care due to there are so many physicians and so much care he needs now.  She feels like that his cardiology issues have not been addressed very well since his hospital discharge.  She stated unable to get any earlier appointments with Dr Mona and pt does have another follow up with Hao Meng, PA in October.    Advised I would check if any earlier appointments with Dr Mona are available.

## 2024-07-12 ENCOUNTER — Telehealth: Payer: Self-pay | Admitting: Dietician

## 2024-07-12 NOTE — Telephone Encounter (Signed)
 Patient and wife called.  They are concerned because he was on Trisiba, then it was changed to Lantus  and he was given Trisiba the last time he picked up his prescriptions.  Chart reviewed. Explained to them that these insulins are in the same category.  Tresiba  stays in the body longer however.  Unsure of the reason for the change.    Patient has an appointment with Dr. Mercie 07/31/2024 and will follow up with him about it at that time.  Also, patient's high alert on his CGM was alerting frequently during this visit.  Discussed ways to change this alarm to prevent alarm fatigue.  Encouraged patient to drink adequate water (but not too much due to CHF) and continue to follow a low sodium diet.  Leita Constable, RD, LDN, CDCES, DipACLM

## 2024-07-13 ENCOUNTER — Telehealth: Payer: Self-pay

## 2024-07-13 NOTE — Telephone Encounter (Signed)
 Follow up call with wife and patient regarding earlier follow up appts for Cardiology.  Spoke with wife and patient.  Advised I spoke with Damien Maid, RN Supervisor of [facility] and explained that pt would like an earlier follow up with Dr Mona.  Advised a NP office visit is available 8/25 at [facility] if further assistance is needed with reviewing meds and concerns.  Pt had post hospital follow up with Hao Meng, PA on 7/23.   She stated she prefers earlier appt with Dr Mona.  Per Damien, offered pt to be seen 10/28 instead of 11/13.  She also said she would needs an appt with Dr Mona because she is a new patient also. She and patient will both have Dr Mona as cardiologist.  [facility] back to back appts for both on 10/28 and she stated that would be fine.  She appreciated getting earlier appointments.   Advised will follow up with ICM monthly fluid levels in September.

## 2024-07-13 NOTE — Telephone Encounter (Signed)
 Spoke with Cassie EP scheduler and appt for patient 10/28 as well as wife is also scheduled.

## 2024-07-19 ENCOUNTER — Encounter: Payer: Self-pay | Admitting: Sleep Medicine

## 2024-07-19 ENCOUNTER — Ambulatory Visit (INDEPENDENT_AMBULATORY_CARE_PROVIDER_SITE_OTHER): Admitting: Sleep Medicine

## 2024-07-19 VITALS — BP 126/70 | HR 73 | Temp 97.3°F | Ht 69.0 in | Wt 256.8 lb

## 2024-07-19 DIAGNOSIS — Z794 Long term (current) use of insulin: Secondary | ICD-10-CM | POA: Diagnosis not present

## 2024-07-19 DIAGNOSIS — I10 Essential (primary) hypertension: Secondary | ICD-10-CM

## 2024-07-19 DIAGNOSIS — E119 Type 2 diabetes mellitus without complications: Secondary | ICD-10-CM

## 2024-07-19 DIAGNOSIS — G4733 Obstructive sleep apnea (adult) (pediatric): Secondary | ICD-10-CM | POA: Diagnosis not present

## 2024-07-19 NOTE — Patient Instructions (Signed)

## 2024-07-19 NOTE — Progress Notes (Signed)
 Name:Charles [name] MRN: [id number] DOB: 08-Mar-1946   CHIEF COMPLAINT:  PAP F/U   HISTORY OF PRESENT ILLNESS: Charles [name] is a 78 y.o. w/ a h/o OSA, DMII, CAD, hyperlipidemia and obesity who presents for PAP follow up visit. Reports using BiPAP therapy every night. States that he is having difficulty getting a good seal on his mask. Overall reports feeling more refreshed upon awakening with CPAP therapy.    EPWORTH SLEEP SCORE 20    06/18/2024   10:00 AM  Results of the Epworth flowsheet  Sitting and reading 3  Watching TV 3  Sitting, inactive in a public place (e.g. a theatre or a meeting) 2  As a passenger in a car for an hour without a break 3  Lying down to rest in the afternoon when circumstances permit 3  Sitting and talking to someone 2  Sitting quietly after a lunch without alcohol 3  In a car, while stopped for a few minutes in traffic 1  Total score 20    PAST MEDICAL HISTORY :   has a past medical history of Acute cystitis with hematuria (12/23/2016), Acute on chronic systolic and diastolic heart failure, NYHA class 1 (03/31/2018), Allergy, Arthritis, Asthma, Atrial fibrillation with RVR (10/10/2017), Back pain due to injury (03/31/2018), Bacteremia (12/23/2016), 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), Cardiomyopathy, ischemic, Carpal tunnel syndrome, Cataract, Cerebrovascular disease, CHF exacerbation (03/31/2018), Chronic bronchitis, Chronic combined systolic and diastolic CHF (congestive heart failure), Chronic pain syndrome, Chronic venous insufficiency, CKD (chronic kidney disease), stage III (01/01/2017), Complication of anesthesia, Constipation (01/03/2023), Coronary artery disease involving native coronary artery of native heart, Diabetic peripheral neuropathy (03/21/2017), Elevated creatine kinase level (2018), Essential hypertension, Gastroesophageal reflux disease without esophagitis (02/05/2016), Hearing loss, Helicobacter pylori gastritis (2016), History  of adenomatous polyp of colon (02/18/2010), History of blood transfusion (~ 2015), History of kidney stones, Hyperlipidemia, Iron deficiency anemia, Kidney stone (01/01/2017), Left bundle branch block (LBBB), Leg edema, Long term (current) use of anticoagulants (03/10/2013), Mild cognitive impairment of uncertain or unknown etiology (03/19/2024), Nail dystrophy (03/21/2017), Nephrolithiasis, NSTEMI (non-ST elevated myocardial infarction) (07/28/2016), Obesity (01/03/2008), Obstructive sleep apnea, Pain in right knee (03/13/2018), Pain in right shoulder (03/13/2018), Pain of left hand (05/23/2018), Pain of left hip joint (09/08/2022), Pneumonia (03/2018), Pressure ulcer (04/04/2018), Pyohydronephrosis (01/01/2017), Rotator cuff tear (last 2 years), 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  has a past surgical history that includes Small intestine surgery (1962); Cystoscopy w/ ureteral stent placement (Left, 06/16/2009; 06/26/2009); Esophagogastroduodenoscopy (09/2015); EUS (N/A, 02/06/2016); Cholecystectomy (02/09/2016); Umbilical hernia repair (01/2016); Knee arthroscopy (Bilateral); Knee cartilage surgery (Left, 1980); Cardiac catheterization; Cardiac catheterization (N/A, 07/29/2016); Cardiac catheterization (N/A, 07/29/2016); Cardiac catheterization (N/A, 09/08/2016); Cardiac catheterization (N/A, 09/08/2016); TEE without cardioversion (N/A, 09/15/2016); Cystoscopy w/ ureteral stent placement (Right, 01/01/2017); Coronary angioplasty with stent (1998 & 2008); Coronary angioplasty with stent; Coronary angioplasty (07/28/2016); Coronary artery bypass graft (N/A, 09/15/2016); Appendectomy (1962); Ureteroscopy with holmium laser lithotripsy (Right, 01/06/2017); Colonoscopy; Upper  gastrointestinal endoscopy; Insert / replace / remove pacemaker (08/2016); Carpal tunnel release (Left, 07/26/2018); Carpal tunnel release (Right, 09/13/2018); BIV PACEMAKER INSERTION CRT-P (N/A, 08/14/2019); Cardioversion (N/A, 02/17/2022); and Cardioversion (N/A, 04/23/2022). Prior to Admission medications   Medication Sig Start Date End Date Taking? Authorizing Provider  acetaminophen  (TYLENOL ) 650 MG CR tablet Take 1,300 mg by mouth every 8 (eight)  hours as needed (Arthritis).   Yes [provider]  albuterol  (VENTOLIN  HFA) 108 (90 Base) MCG/ACT inhaler Inhale into the lungs every 6 (six) hours as needed for wheezing or shortness of breath.   Yes [provider]  alfuzosin  (UROXATRAL ) 10 MG 24 hr tablet Take 10 mg by mouth daily with breakfast.   Yes [provider]  aspirin  EC 81 MG tablet Take 1 tablet (81 mg total) by mouth at bedtime. 06/13/24  Yes [name], Hao, PA  bismuth subsalicylate (PEPTO BISMOL) 262 MG/15ML suspension Take 30 mLs by mouth every 6 (six) hours as needed for indigestion or diarrhea or loose stools.   Yes [provider]  COLLAGEN PO Take 2,500 mg by mouth in the morning. With vitamin C   Yes [provider]  Continuous Glucose Receiver (DEXCOM G7 RECEIVER) DEVI 1 Device by Does not apply route continuous. 01/26/24  Yes [name], [place], MD  Continuous Glucose Sensor (DEXCOM G7 SENSOR) MISC APPLY 1 SENSOR FOR 10 DAYS, THEN REMOVE AND REPLACE 07/02/24  Yes [name], [place], MD  cyanocobalamin 1000 MCG tablet Take 1,000 mcg by mouth in the morning.   Yes [provider]  docusate sodium  (COLACE) 100 MG capsule TAKE 1 CAPSULE (100 MG TOTAL) BY MOUTH EVERY 12 (TWELVE) HOURS. AS NEEDED 07/11/24  Yes Avram Charles BRAVO, MD  erythromycin ophthalmic ointment SMARTSIG:1-2 In Eye(s) 4 Times Daily 05/30/24  Yes [provider]  ezetimibe  (ZETIA ) 10 MG tablet TAKE 1 TABLET BY MOUTH EVERY DAY 01/02/24  Yes [name], [place], MD  fluticasone (FLONASE) 50  MCG/ACT nasal spray Place 1 spray into both nostrils daily as needed for allergies or rhinitis.   Yes [provider]  furosemide  (LASIX ) 40 MG tablet Take 1 tablet (40 mg total) by mouth daily as needed for fluid or edema. 07/08/23  Yes Burnard Debby LABOR, MD  gabapentin  (NEURONTIN ) 300 MG capsule Take 1 capsule (300 mg total) by mouth 3 (three) times daily. TAKE 1 CAPSULE BY MOUTH THREE TIMES A DAY Patient taking differently: Take 300 mg by mouth daily as needed. TAKE 1 CAPSULE BY MOUTH ONCE A DAY 01/09/24  Yes [name], [place], MD  Glucagon  (GVOKE HYPOPEN  2-PACK) 1 MG/0.2ML SOAJ Inject to treat severe low blood sugars 06/30/22  Yes Von Pacific, MD  insulin  aspart (NOVOLOG ) 100 UNIT/ML injection INJECT 5-15 UNITS UNDER THE SKIN THREE TIMES DAILY BEFORE MEALS. 04/19/24  Yes [name], [place], MD  insulin  glargine (LANTUS  SOLOSTAR) 100 UNIT/ML Solostar Pen Inject 30 Units into the skin daily. Patient taking differently: Inject 30 Units into the skin in the morning. 04/19/24  Yes [name], [place], MD  Insulin  Pen Needle (BD ULTRA-FINE PEN NEEDLES) 29G X 12.7MM MISC USE DAILY TO INJECT TRESIBA  INSULIN  02/03/24  Yes [name], [place], MD  Insulin  Syringe-Needle U-100 (BD INSULIN  SYRINGE U/F) 30G X 1/2 0.5 ML MISC USE TO INJECT INSULIN  3 TIMES DAILY 02/03/24  Yes [name], [place], MD  Lancets Misc. (ACCU-CHEK SOFTCLIX LANCET DEV) KIT by Does not apply route.   Yes [provider]  latanoprost (XALATAN) 0.005 % ophthalmic solution Place 1 drop into both eyes at bedtime. 08/17/22  Yes [provider]  metoprolol  tartrate (LOPRESSOR ) 25 MG tablet Take 0.5 tablets (12.5 mg total) by mouth 2 (two) times daily. 06/26/24  Yes Adams, Zane, PA-C  metroNIDAZOLE  (METROCREAM ) 0.75 % cream Apply topically 2 (two) times daily as needed (for rosecea).   Yes [provider]  nitroGLYCERIN  (NITROSTAT ) 0.4 MG SL tablet PLACE 1 TABLET UNDER  THE TONGUE EVERY 5 MINUTES AS NEEDED FOR CHEST PAIN Patient taking differently:  Place 0.4 mg under the tongue every 5 (five) minutes as needed for chest pain. 05/10/22  Yes Burnard Debby LABOR, MD  nystatin-triamcinolone  ointment (MYCOLOG) Apply 1 Application topically 2 (two) times daily.   Yes [provider]  ondansetron  (ZOFRAN ) 4 MG tablet Take 4 mg by mouth every 8 (eight) hours as needed for nausea or vomiting.   Yes [provider]  polyethylene glycol (MIRALAX  / GLYCOLAX ) 17 g packet Take 17 g by mouth 2 (two) times daily as needed for severe constipation. 06/02/23  Yes Mesner, Selinda, MD  promethazine  (PHENERGAN ) 25 MG tablet Take 1 tablet by mouth every 8 (eight) hours as needed for nausea. 06/25/19  Yes [provider]  rivaroxaban  (XARELTO ) 20 MG TABS tablet Take 1 tablet (20 mg total) by mouth daily before breakfast. 06/13/24  Yes [name], Hao, PA  triamcinolone  cream (KENALOG) 0.1 % Apply 1 application. topically 2 (two) times daily as needed (rash). 12/07/21  Yes [provider]  zinc  gluconate 50 MG tablet Take 50 mg by mouth in the morning.   Yes [provider]   Allergies  Allergen Reactions   Black Pepper [Piper] Other (See Comments)    Irritates back of throat   Chocolate Hives, Shortness Of Breath and Swelling    Dark chocolate   Codeine  Itching and Swelling   Oxytetracycline Other (See Comments)    Flushing in sunlight   Statins Other (See Comments)    Mental changes, muscle aches   Flavoring Agent Other (See Comments)   Latex Itching and Other (See Comments)    Sensitive skin   Other Other (See Comments)    Opioids mess up his Stomach   Penicillins    Tape Rash and Other (See Comments)    SKIN IS VERY SENSITIVE!!    FAMILY HISTORY:  family history includes Arthritis in his sister; Dementia (age of onset: 29) in his mother; Epilepsy in his brother; Heart attack (age of onset: 29) in his mother; Lung cancer in his maternal grandfather; Neuropathy in his brother; Stroke in his maternal grandmother and paternal  grandfather; Tremor in his maternal aunt and mother. SOCIAL HISTORY:  reports that he quit smoking about 49 years ago. His smoking use included cigarettes. He started smoking about 54 years ago. He has a 5 pack-year smoking history. He has never used smokeless tobacco. He reports that he does not drink alcohol and does not use drugs.   Review of Systems:  Gen:  Denies  fever, sweats, chills weight loss  HEENT: Denies blurred vision, double vision, ear pain, eye pain, hearing loss, nose bleeds, sore throat Cardiac:  No dizziness, chest pain or heaviness, chest tightness,edema, No JVD Resp:   No cough, -sputum production, -shortness of breath,-wheezing, -hemoptysis,  Gi: Denies swallowing difficulty, stomach pain, nausea or vomiting, diarrhea, constipation, bowel incontinence Gu:  Denies bladder incontinence, burning urine Ext:   Denies Joint pain, stiffness or swelling Skin: Denies  skin rash, easy bruising or bleeding or hives Endoc:  Denies polyuria, polydipsia , polyphagia or weight change Psych:   Denies depression, insomnia or hallucinations  Other:  All other systems negative  VITAL SIGNS: BP 126/70   Pulse 73   Temp (!) 97.3 F (36.3 C)   Ht 5' 9 (1.753 m)   Wt 256 lb 12.8 oz (116.5 kg)   SpO2 95%   BMI 37.92 kg/m    Physical Examination:  General Appearance: No distress  EYES PERRLA, EOM intact.   NECK Supple, No JVD Pulmonary: normal breath sounds, No wheezing.  CardiovascularNormal S1,S2.  No m/r/g.   Abdomen: Benign, Soft, non-tender. Skin:   warm, no rashes, no ecchymosis  Extremities: normal, no cyanosis, clubbing. Neuro:without focal findings,  speech normal  PSYCHIATRIC: Mood, affect within normal limits.   ASSESSMENT AND PLAN  OSA Patient is using and benefiting from PAP therapy. Counseled patient on proper mask fit and also adjusted PAP setting. Discussed the consequences of untreated sleep apnea. Advised not to drive drowsy for safety of patient and  others. Will follow up in 2 months.    DMII Stable, on current management. Following with PCP.    Patient  satisfied with Plan of action and management. All questions answered  I spent a total of 37 minutes reviewing chart data, face-to-face evaluation with the patient, counseling and coordination of care as detailed above.    Eliza Grissinger, M.D.  Sleep Medicine [facility]

## 2024-07-31 ENCOUNTER — Encounter: Payer: Self-pay | Admitting: Endocrinology

## 2024-07-31 ENCOUNTER — Ambulatory Visit: Payer: Self-pay | Admitting: Endocrinology

## 2024-07-31 ENCOUNTER — Ambulatory Visit (INDEPENDENT_AMBULATORY_CARE_PROVIDER_SITE_OTHER): Admitting: Endocrinology

## 2024-07-31 VITALS — BP 102/70 | HR 70 | Resp 20 | Ht 69.0 in | Wt 259.2 lb

## 2024-07-31 DIAGNOSIS — E118 Type 2 diabetes mellitus with unspecified complications: Secondary | ICD-10-CM

## 2024-07-31 DIAGNOSIS — Z794 Long term (current) use of insulin: Secondary | ICD-10-CM | POA: Diagnosis not present

## 2024-07-31 LAB — POCT GLYCOSYLATED HEMOGLOBIN (HGB A1C): Hemoglobin A1C: 6.4 % — AB (ref 4.0–5.6)

## 2024-07-31 MED ORDER — LANTUS SOLOSTAR 100 UNIT/ML ~~LOC~~ SOPN: 25.0000 [IU] | PEN_INJECTOR | Freq: Every day | SUBCUTANEOUS | 4 refills | Status: DC

## 2024-07-31 MED ORDER — INSULIN ASPART 100 UNIT/ML IJ SOLN: INTRAMUSCULAR | 3 refills | Status: AC
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
## 2024-07-31 NOTE — Patient Instructions (Signed)
 Decrease Tresiba  25 units in am.  NovoLog  5 -15 units at  most usually 3 meals a day. Take less of Novolog  in case of exercise or increased physical activity.   Okay to 2-3 units of novolog  for bedtime snack.

## 2024-08-06 ENCOUNTER — Ambulatory Visit: Attending: Cardiovascular Disease

## 2024-08-06 DIAGNOSIS — I5042 Chronic combined systolic (congestive) and diastolic (congestive) heart failure: Secondary | ICD-10-CM

## 2024-08-06 DIAGNOSIS — Z950 Presence of cardiac pacemaker: Secondary | ICD-10-CM

## 2024-08-07 ENCOUNTER — Ambulatory Visit: Admitting: Nurse Practitioner

## 2024-08-08 ENCOUNTER — Telehealth: Payer: Self-pay

## 2024-08-08 NOTE — Progress Notes (Signed)
 EPIC Encounter for ICM Monitoring  Patient Name: Charles [name] is a 78 y.o. male Date: 08/08/2024 Primary Care Physican: Montey Lot, PA-C Primary Cardiologist: [facility] Electrophysiologist: [facility] Pacing: 80%  03/30/2023 Office weight: 280 lbs 05/24/2023 Weight:  271 lbs 01/20/2024 Office Weight: 275 lbs 03/12/2024 Office Weight: 269 lbs 06/13/2024 Office Weight: 254.9 lbs   AT/AF Burden:  <1% (taking Xarelto )                                                          Attempted call to patient and unable to reach.   Transmission results reviewed.      CorVue thoracic impedance suggesting (since 8/14) normal fluid levels with the exception of possible fluid accumulation from 8/29-9/3.   Prescribed: Furosemide  40 mg take 1 tablet (40 mg total) by mouth daily as needed   Labs: 06/03/2024 Creatinine 1.01, BUN 18, Potassium 3.7, Sodium 136, GFR >60  06/03/2024 Creatinine 1.18, BUN 22, Potassium 3.9, Sodium 134, GFR >60  06/02/2024 Creatinine 1.07, BUN 21, Potassium 3.6, Sodium 138, GFR >60  06/01/2024 Creatinine 1.34, BUN 24, Potassium 3.6, Sodium 140, GFR 55 05/11/2024 Creatinine 1.11, BUN 21, Potassium 4.7, Sodium 138 A complete set of results can be found in Results Review.   Recommendations:  Unable to reach.     Follow-up plan: ICM clinic phone appointment on 09/17/2024.   91 day device clinic remote transmission 08/20/2024.   EP/Cardiology Office Visits:   08/28/2024 with Scot Ford, PA.  09/18/2024 with Dr Mona.    Recall 01/23/2025 with EP APP   Copy of ICM check sent to Dr. Nancey.   3 month ICM trend: 08/06/2024.    12-14 Month ICM trend:     Mitzie GORMAN Garner, RN 08/08/2024 4:26 PM

## 2024-08-08 NOTE — Telephone Encounter (Signed)
 Remote ICM transmission received.  Attempted call to patient regarding ICM remote transmission and no answer.

## 2024-08-20 ENCOUNTER — Ambulatory Visit (INDEPENDENT_AMBULATORY_CARE_PROVIDER_SITE_OTHER): Payer: Medicare Other

## 2024-08-20 DIAGNOSIS — I480 Paroxysmal atrial fibrillation: Secondary | ICD-10-CM | POA: Diagnosis not present

## 2024-08-21 LAB — CUP PACEART REMOTE DEVICE CHECK
Battery Remaining Longevity: 35 mo
Battery Remaining Percentage: 34 %
Battery Voltage: 2.95 V
Brady Statistic AP VP Percent: 79 %
Brady Statistic AP VS Percent: 4.6 %
Brady Statistic AS VP Percent: 1.5 %
Brady Statistic AS VS Percent: 13 %
Brady Statistic RA Percent Paced: 79 %
Date Time Interrogation Session: 20250929020009
Implantable Lead Connection Status: 753985
Implantable Lead Connection Status: 753985
Implantable Lead Connection Status: 753985
Implantable Lead Implant Date: 20091007
Implantable Lead Implant Date: 20091007
Implantable Lead Implant Date: 20200922
Implantable Lead Location: 753858
Implantable Lead Location: 753859
Implantable Lead Location: 753860
Implantable Pulse Generator Implant Date: 20200922
Lead Channel Impedance Value: 1250 Ohm
Lead Channel Impedance Value: 460 Ohm
Lead Channel Impedance Value: 660 Ohm
Lead Channel Pacing Threshold Amplitude: 0.5 V
Lead Channel Pacing Threshold Amplitude: 0.625 V
Lead Channel Pacing Threshold Amplitude: 1.25 V
Lead Channel Pacing Threshold Pulse Width: 0.4 ms
Lead Channel Pacing Threshold Pulse Width: 0.4 ms
Lead Channel Pacing Threshold Pulse Width: 0.5 ms
Lead Channel Sensing Intrinsic Amplitude: 12 mV
Lead Channel Sensing Intrinsic Amplitude: 4.5 mV
Lead Channel Setting Pacing Amplitude: 1.625
Lead Channel Setting Pacing Amplitude: 2 V
Lead Channel Setting Pacing Amplitude: 2.25 V
Lead Channel Setting Pacing Pulse Width: 0.4 ms
Lead Channel Setting Pacing Pulse Width: 0.5 ms
Lead Channel Setting Sensing Sensitivity: 2 mV
Pulse Gen Model: 3562
Pulse Gen Serial Number: 9157656

## 2024-08-22 NOTE — Progress Notes (Signed)
 Remote PPM Transmission

## 2024-08-28 ENCOUNTER — Ambulatory Visit: Attending: Physician Assistant | Admitting: Physician Assistant

## 2024-08-28 ENCOUNTER — Encounter: Payer: Self-pay | Admitting: Physician Assistant

## 2024-08-28 ENCOUNTER — Ambulatory Visit: Payer: Self-pay | Admitting: Cardiovascular Disease

## 2024-08-28 VITALS — BP 128/72 | HR 76 | Resp 16 | Ht 69.0 in | Wt 250.2 lb

## 2024-08-28 DIAGNOSIS — R55 Syncope and collapse: Secondary | ICD-10-CM | POA: Insufficient documentation

## 2024-08-28 DIAGNOSIS — G4733 Obstructive sleep apnea (adult) (pediatric): Secondary | ICD-10-CM | POA: Insufficient documentation

## 2024-08-28 DIAGNOSIS — I255 Ischemic cardiomyopathy: Secondary | ICD-10-CM | POA: Insufficient documentation

## 2024-08-28 DIAGNOSIS — I10 Essential (primary) hypertension: Secondary | ICD-10-CM | POA: Insufficient documentation

## 2024-08-28 DIAGNOSIS — E785 Hyperlipidemia, unspecified: Secondary | ICD-10-CM | POA: Diagnosis present

## 2024-08-28 DIAGNOSIS — I25810 Atherosclerosis of coronary artery bypass graft(s) without angina pectoris: Secondary | ICD-10-CM | POA: Insufficient documentation

## 2024-08-28 DIAGNOSIS — Z950 Presence of cardiac pacemaker: Secondary | ICD-10-CM | POA: Insufficient documentation

## 2024-08-28 DIAGNOSIS — I480 Paroxysmal atrial fibrillation: Secondary | ICD-10-CM | POA: Insufficient documentation

## 2024-08-28 MED ORDER — METOPROLOL SUCCINATE ER 25 MG PO TB24: 12.5000 mg | ORAL_TABLET | Freq: Every day | ORAL | 3 refills | Status: AC

## 2024-08-28 NOTE — Patient Instructions (Addendum)
 Medication Instructions:   START TAKING : METOPROLOL  SUCCINATE 12.5 MG ONCE A DAY    STOP TAKING:    ALFUZON    STOP TAKING : METOPROLOL  TARTRATE  *If you need a refill on your cardiac medications before your next appointment, please call your pharmacy*   Lab Work:NONE ORDERED  TODAY    If you have labs (blood work) drawn today and your tests are completely normal, you will receive your results only by: MyChart Message (if you have MyChart) OR A paper copy in the mail If you have any lab test that is abnormal or we need to change your treatment, we will call you to review the results.    Testing/Procedures:Your physician has requested that you have an echocardiogram. Echocardiography is a painless test that uses sound waves to create images of your heart. It provides your doctor with information about the size and shape of your heart and how well your heart's chambers and valves are working. This procedure takes approximately one hour. There are no restrictions for this procedure. Please do NOT wear cologne, perfume, aftershave, or lotions (deodorant is allowed). Please arrive 15 minutes prior to your appointment time.  Please note: We ask at that you not bring children with you during ultrasound (echo/ vascular) testing. Due to room size and safety concerns, children are not allowed in the ultrasound rooms during exams. Our front office staff cannot provide observation of children in our lobby area while testing is being conducted. An adult accompanying a patient to their appointment will only be allowed in the ultrasound room at the discretion of the ultrasound technician under special circumstances. We apologize for any inconvenience.     Follow-Up: At [facility], you and your health needs are our priority.  As part of our continuing mission to provide you with exceptional heart care, our providers are all part of one team.  This team includes your primary Cardiologist  (physician) and Advanced Practice Providers or APPs (Physician Assistants and Nurse Practitioners) who all work together to provide you with the care you need, when you need it.   Your next appointment:   AS SCHEDULED  Provider:   Vinie JAYSON Maxcy, MD   SEND IN A REMOTE DEVICE INTERROGATION   We recommend signing up for the patient portal called MyChart.  Sign up information is provided on this After Visit Summary.  MyChart is used to connect with patients for Virtual Visits (Telemedicine).  Patients are able to view lab/test results, encounter notes, upcoming appointments, etc.  Non-urgent messages can be sent to your provider as well.   To learn more about what you can do with MyChart, go to [email or web address].   Other Instructions

## 2024-08-28 NOTE — Progress Notes (Unsigned)
 Cardiology Office Note   Date:  08/30/2024  ID:  Taz, Vanness 02-22-1946, MRN [id number] PCP: Montey Lot, PA-C  [facility] Providers Cardiologist:  Vinie JAYSON Maxcy, MD Electrophysiologist:  Eulas FORBES Furbish, MD     History of Present Illness MARQUAVION VENHUIZEN is a 78 y.o. male with past medical history of CAD s/p CABG, PAF, SSS s/p PPM, OSA on BIPAP, chronic venous insufficiency, hypertension, hyperlipidemia, DM2 and GERD.  He had known coronary disease dating back to 1995 and underwent numerous PTCA and stent placement to his left circumflex, LAD and RCA.  He was admitted in September 2017 with NSTEMI treated with PTCA of D1 and the mid left circumflex for in-stent restenosis, EF was 35 to 40% at the time.  He underwent repeat cardiac catheterization in October 2017 for in-stent restenosis for each of the 3 sites treated in September and had a significant multivessel CAD.  CABG was recommended and he ultimately underwent CABG x 4 on 09/15/2016 with LIMA-LAD, SVG-OM, SVG-PDA and SVG-diagonal.  He was hospitalized in May 2019 with acute diastolic and systolic heart failure.  Echocardiogram obtained in May 2019 showed EF 30%, moderate MR.  Follow-up echocardiogram in June 2022 showed EF has improved to 55 to 60% without wall motion abnormality, grade 1 DD, dilated aorta measuring at 45 mm.  Repeat echocardiogram in March 2023 showed EF of 45 to 50%, global hypokinesis, severe biatrial enlargement and mild MR.  His blood pressure was low during the office visit in December 2024 with systolic blood pressure down to the 90s, carvedilol  was cut back.  Due to elevated blood pressure, carvedilol  was increased back up to 12.5 mg twice a day by February 2025.    He was seen in the ED in June 2025 for dizziness with near syncope.  Blood work consistent with mild dehydration and orthostatic vital sign was normal.  He was seen in June by Lum Organ at which time dizziness and  lightheadedness has resolved.  He was instructed to increase fluid hydration.  More recently, patient was admitted to the hospital in July 2025 with syncope.  His orthostatic vital sign was positive.  Medication including alfazosin, Entresto  and carvedilol  were held.  Repeat orthostatic vital sign at the time of discharge was normal.  He was instructed to resume Entresto  and continue to hold off of alfazosin and carvedilol .   I last saw the patient on 06/13/2024 at which time he remained off of Entresto .  He has been placed on low-dose metoprolol  to tartrate 12.5 mg twice a day.  I decided to hold off on resuming ARNI given recent syncope.  Device interrogation in September 2025 showed normal device function, no significant arrhythmia.  I did refer him to pulmonology service to help manage OSA.  Patient has since established with Dr. Jess our pulmonary sleep specialist. I plan to set the patient up by Dr. Maxcy.   Patient presents today for follow-up.  Unfortunately he passed out again last Saturday.  He was talking to his neighbor outside and after his neighbor left he got up to go back to his house when he passed out.  Orthostatic vital signs obtained in the office below.  His blood pressure did drop down to 82/47 upon standing.  He says his alfazosyn and was recently restarted.  I discussed his case with DOD Dr. Floretta.  We decided to discontinue his alfazosyn and metoprolol  tartrate and placed him on metoprolol  succinate 12.5 mg daily.  He  will need a repeat echocardiogram.  We will do another device interrogation to make sure he does not have significant arrhythmia.  He can follow-up with Dr. Mona as previously scheduled near the end of this month.   Orthostatic vital sign Lying 96/60 heart rate 71 Sitting 99/61 heart rate 71 Standing 0-minute 82/47 heart rate 71 Standing 3-minute 86/47 heart rate 71.   ROS:   Patient had another episode of syncope.  He denies any chest pain or shortness of  breath.  Studies Reviewed      Cardiac Studies & Procedures   ______________________________________________________________________________________________     ECHOCARDIOGRAM  ECHOCARDIOGRAM COMPLETE 11/02/2023  Narrative ECHOCARDIOGRAM REPORT    Patient Name:   JERMAR COLTER Date of Exam: 11/02/2023 Medical Rec #:  [id number]        Height:       69.0 in Accession #:    [id number]       Weight:       281.0 lb Date of Birth:  30-Mar-1946        BSA:          2.387 m Patient Age:    77 years         BP:           108/60 mmHg Patient Gender: M                HR:           72 bpm. Exam Location:  [address]  Procedure: 2D Echo, Cardiac Doppler, Color Doppler and Intracardiac Opacification Agent  Indications:    I51.9 Decreased LV function  History:        Patient has prior history of Echocardiogram examinations, most recent 02/11/2022. CHF, CAD, Pacemaker, Arrythmias:Atrial Fibrillation; Risk Factors:Hypertension, Diabetes and Former Smoker. Sick sinus syndrome. Obesity. Chronic bronchitis.  Sonographer:    Jon Hacker RCS Referring Phys: [phone] THOMAS A KELLY  IMPRESSIONS   1. Left ventricular ejection fraction, by estimation, is 35 to 40%. The left ventricle has moderately decreased function. The left ventricle demonstrates global hypokinesis. The left ventricular internal cavity size was mildly dilated. There is mild concentric left ventricular hypertrophy. Left ventricular diastolic parameters are consistent with Grade I diastolic dysfunction (impaired relaxation). 2. Right ventricular systolic function is mildly reduced. The right ventricular size is not well visualized. There is normal pulmonary artery systolic pressure. The estimated right ventricular systolic pressure is 16.1 mmHg. 3. The mitral valve is normal in structure. Trivial mitral valve regurgitation. No evidence of mitral stenosis. 4. The aortic valve is tricuspid. There is mild calcification of the  aortic valve. Aortic valve regurgitation is not visualized. No aortic stenosis is present. 5. Aortic dilatation noted. There is moderate dilatation of the ascending aorta, measuring 45 mm. 6. The inferior vena cava is normal in size with greater than 50% respiratory variability, suggesting right atrial pressure of 3 mmHg. 7. Technically difficult study with poor acoustic windows.  FINDINGS Left Ventricle: Left ventricular ejection fraction, by estimation, is 35 to 40%. The left ventricle has moderately decreased function. The left ventricle demonstrates global hypokinesis. The left ventricular internal cavity size was mildly dilated. There is mild concentric left ventricular hypertrophy. Left ventricular diastolic parameters are consistent with Grade I diastolic dysfunction (impaired relaxation).  Right Ventricle: The right ventricular size is not well visualized. Right vetricular wall thickness was not well visualized. Right ventricular systolic function is mildly reduced. There is normal pulmonary artery systolic pressure. The tricuspid regurgitant velocity is  1.81 m/s, and with an assumed right atrial pressure of 3 mmHg, the estimated right ventricular systolic pressure is 16.1 mmHg.  Left Atrium: Left atrial size was normal in size.  Right Atrium: Right atrial size was normal in size.  Pericardium: There is no evidence of pericardial effusion.  Mitral Valve: The mitral valve is normal in structure. Trivial mitral valve regurgitation. No evidence of mitral valve stenosis.  Tricuspid Valve: The tricuspid valve is normal in structure. Tricuspid valve regurgitation is trivial.  Aortic Valve: The aortic valve is tricuspid. There is mild calcification of the aortic valve. Aortic valve regurgitation is not visualized. No aortic stenosis is present.  Pulmonic Valve: The pulmonic valve was not well visualized. Pulmonic valve regurgitation is not visualized.  Aorta: Aortic dilatation noted. 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  Venous: The inferior vena cava is normal in size with greater than 50% respiratory variability, suggesting right atrial pressure of 3 mmHg.  IAS/Shunts: No atrial level shunt detected by color flow Doppler.  Additional Comments: A device lead is visualized in the right ventricle.   LEFT VENTRICLE PLAX 2D LVIDd:         6.20 cm   Diastology LVIDs:         4.90 cm   LV e' medial:    4.57 cm/s LV PW:         1.40 cm   LV E/e' medial:  10.9 LV IVS:        1.30 cm   LV e' lateral:   6.64 cm/s LVOT diam:     2.10 cm   LV E/e' lateral: 7.5 LV SV:         60 LV SV Index:   25 LVOT Area:     3.46 cm   RIGHT VENTRICLE RV Basal diam:  3.10 cm RV S prime:     7.62 cm/s TAPSE (M-mode): 1.8 cm RVSP:           16.1 mmHg  LEFT ATRIUM           Index        RIGHT ATRIUM           Index LA diam:      5.30 cm 2.22 cm/m   RA Pressure: 3.00 mmHg LA Vol (A4C): 31.5 ml 13.20 ml/m  RA Area:     14.00 cm RA Volume:   32.20 ml  13.49 ml/m AORTIC VALVE LVOT Vmax:   86.20 cm/s LVOT Vmean:  59.100 cm/s LVOT VTI:    0.174 m  AORTA Ao Root diam: 3.70 cm Ao Asc diam:  4.50 cm  MITRAL VALVE               TRICUSPID VALVE MV Area (PHT): 2.73 cm    TR Peak grad:   13.1 mmHg MV Decel Time: 278 msec    TR Vmax:        181.00 cm/s MV E velocity: 49.80 cm/s  Estimated RAP:  3.00 mmHg MV A velocity: 78.60 cm/s  RVSP:           16.1 mmHg MV E/A ratio:  0.63 SHUNTS Systemic VTI:  0.17 m Systemic Diam: 2.10 cm  Dalton McleanMD Electronically signed by Ezra Kanner Signature Date/Time: 11/02/2023/4:07:32 PM    Final          ______________________________________________________________________________________________      Risk Assessment/Calculations          Physical Exam VS:  BP  128/72 (BP Location: Left Arm, Patient Position: Sitting, Cuff Size: Large)   Pulse 76   Resp 16   Ht 5' 9 (1.753 m)   Wt 250 lb 3.2  oz (113.5 kg)   SpO2 93%   BMI 36.95 kg/m        Wt Readings from Last 3 Encounters:  08/28/24 250 lb 3.2 oz (113.5 kg)  07/31/24 259 lb 3.2 oz (117.6 kg)  07/19/24 256 lb 12.8 oz (116.5 kg)    GEN: Well nourished, well developed in no acute distress NECK: No JVD; No carotid bruits CARDIAC: RRR, no murmurs, rubs, gallops RESPIRATORY:  Clear to auscultation without rales, wheezing or rhonchi  ABDOMEN: Soft, non-tender, non-distended EXTREMITIES:  No edema; No deformity   ASSESSMENT AND PLAN  Syncope: Patient had another episode of syncope last weekend.  Orthostatic vital sign was negative, however blood pressure did drop down to the 80s.  I discussed the case with Dr. Floretta, we will discontinue his alfazosyn and metoprolol  tartrate, given his LV dysfunction, we will place him on low-dose 12.5 mg daily of metoprolol  succinate instead.  We will interrogate his pacemaker and obtain a repeat echocardiogram  Ischemic cardiomyopathy: Given recurrent syncope.  Will repeat echocardiogram.  CAD status post CABG: Denies any chest pain  PAF: On Xarelto .  Maintaining sinus rhythm  History of pacemaker: Will do another device interrogation given syncope event after the last device interrogation  OSA: Patient has been referred to pulmonology service to manage BiPAP therapy  Hypertension: Patient is actually hypotensive on orthostatic vital sign.  The blood pressure dropped out of the 80s.  Hyperlipidemia: On Zetia .  Intolerant of statins.       Dispo: Follow-up with Dr. Mona near the end of this month.  Signed, Scot Ford, PA

## 2024-09-04 ENCOUNTER — Other Ambulatory Visit

## 2024-09-04 ENCOUNTER — Ambulatory Visit: Attending: Physician Assistant

## 2024-09-04 DIAGNOSIS — I255 Ischemic cardiomyopathy: Secondary | ICD-10-CM | POA: Diagnosis not present

## 2024-09-04 LAB — ECHOCARDIOGRAM COMPLETE
AR max vel: 3.18 cm2
AV Area VTI: 3.09 cm2
AV Area mean vel: 3.15 cm2
AV Mean grad: 3 mmHg
AV Peak grad: 5 mmHg
Ao pk vel: 1.11 m/s
Area-P 1/2: 3.17 cm2
S' Lateral: 5.22 cm

## 2024-09-04 MED ORDER — PERFLUTREN LIPID MICROSPHERE
1.0000 mL | INTRAVENOUS | Status: AC | PRN
  Administered 2024-09-04: 3 mL via INTRAVENOUS

## 2024-09-05 ENCOUNTER — Ambulatory Visit

## 2024-09-05 ENCOUNTER — Ambulatory Visit: Payer: Self-pay | Admitting: Physician Assistant

## 2024-09-17 ENCOUNTER — Ambulatory Visit: Attending: Cardiovascular Disease

## 2024-09-17 ENCOUNTER — Telehealth: Payer: Self-pay

## 2024-09-17 DIAGNOSIS — I5042 Chronic combined systolic (congestive) and diastolic (congestive) heart failure: Secondary | ICD-10-CM | POA: Diagnosis not present

## 2024-09-17 DIAGNOSIS — Z950 Presence of cardiac pacemaker: Secondary | ICD-10-CM | POA: Diagnosis not present

## 2024-09-17 NOTE — Progress Notes (Signed)
 EPIC Encounter for ICM Monitoring  Patient Name: Charles [name] is a 78 y.o. male Date: 09/17/2024 Primary Care Physican: Montey Lot, PA-C Primary Cardiologist: [facility] Electrophysiologist: [facility] Bi-V Pacing: 81% (08/20/2024 report) 03/30/2023 Office weight: 280 lbs 05/24/2023 Weight:  271 lbs 01/20/2024 Office Weight: 275 lbs 03/12/2024 Office Weight: 269 lbs 06/13/2024 Office Weight: 254.9 lbs   AT/AF Burden:  <1% (taking Xarelto ) (08/20/2024 report)                                                          Transmission results reviewed.      Since 08/06/2024 ICM Remote Transmission:  [facility]ue thoracic impedance through 09/13/2024, suggesting possible fluid accumulation from 08/25/2024-09/05/2024.   Prescribed: Furosemide  40 mg take 1 tablet (40 mg total) by mouth daily as needed   Labs: 06/03/2024 Creatinine 1.01, BUN 18, Potassium 3.7, Sodium 136, GFR >60  06/03/2024 Creatinine 1.18, BUN 22, Potassium 3.9, Sodium 134, GFR >60  06/02/2024 Creatinine 1.07, BUN 21, Potassium 3.6, Sodium 138, GFR >60  06/01/2024 Creatinine 1.34, BUN 24, Potassium 3.6, Sodium 140, GFR 55 05/11/2024 Creatinine 1.11, BUN 21, Potassium 4.7, Sodium 138 A complete set of results can be found in Results Review.   Recommendations:  Impedance shown through 10/23.   Unable to get updated Merlin transmission through 09/17/2024.   Copy sent to Dr Mona for review at 09/18/2024 office visit.    Follow-up plan: ICM clinic phone appointment on 10/22/2024.   91 day device clinic remote transmission 11/19/2024.   EP/Cardiology Office Visits:   09/18/2024 with Dr Mona.    Recall 01/23/2025 with EP APP   Copy of ICM check sent to Dr. Nancey.   Remote monitoring is medically necessary for Heart Failure Management.    Daily Thoracic Impedance ICM trend: 09/13/2024.    Mitzie GORMAN Garner, RN 09/17/2024 5:15 PM

## 2024-09-17 NOTE — Telephone Encounter (Signed)
 Attempted ICM Call to home number.  Left message requesting a remote transmission to check monthly fluid levels.  Left call back number.

## 2024-09-17 NOTE — Telephone Encounter (Signed)
 Spoke with patient.  Requested to send manual remote transmission for review of monthly fluid levels.  He said he would try later today.

## 2024-09-18 ENCOUNTER — Ambulatory Visit: Attending: Internal Medicine | Admitting: Internal Medicine

## 2024-09-18 ENCOUNTER — Ambulatory Visit: Admitting: Sleep Medicine

## 2024-09-18 ENCOUNTER — Encounter: Payer: Self-pay | Admitting: Internal Medicine

## 2024-09-18 VITALS — BP 128/76 | HR 80 | Resp 16 | Ht 69.0 in | Wt 255.0 lb

## 2024-09-18 DIAGNOSIS — I25810 Atherosclerosis of coronary artery bypass graft(s) without angina pectoris: Secondary | ICD-10-CM | POA: Diagnosis present

## 2024-09-18 DIAGNOSIS — I5042 Chronic combined systolic (congestive) and diastolic (congestive) heart failure: Secondary | ICD-10-CM | POA: Diagnosis not present

## 2024-09-18 DIAGNOSIS — I480 Paroxysmal atrial fibrillation: Secondary | ICD-10-CM | POA: Insufficient documentation

## 2024-09-18 DIAGNOSIS — G629 Polyneuropathy, unspecified: Secondary | ICD-10-CM | POA: Insufficient documentation

## 2024-09-18 DIAGNOSIS — R55 Syncope and collapse: Secondary | ICD-10-CM | POA: Diagnosis present

## 2024-09-18 DIAGNOSIS — R5383 Other fatigue: Secondary | ICD-10-CM | POA: Insufficient documentation

## 2024-09-18 DIAGNOSIS — E785 Hyperlipidemia, unspecified: Secondary | ICD-10-CM | POA: Diagnosis not present
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
## 2024-09-18 NOTE — Patient Instructions (Signed)
 Medication Instructions:  NO CHANGES  *If you need a refill on your cardiac medications before your next appointment, please call your pharmacy*  Lab Work: Lipid Panel, Direct LDL, B12 today -- 1st Floor If you have labs (blood work) drawn today and your tests are completely normal, you will receive your results only by: MyChart Message (if you have MyChart) OR A paper copy in the mail If you have any lab test that is abnormal or we need to change your treatment, we will call you to review the results.   Follow-Up: At [facility], you and your health needs are our priority.  As part of our continuing mission to provide you with exceptional heart care, our providers are all part of one team.  This team includes your primary Cardiologist (physician) and Advanced Practice Providers or APPs (Physician Assistants and Nurse Practitioners) who all work together to provide you with the care you need, when you need it.  Your next appointment:    6 months with Dr. Mona  We recommend signing up for the patient portal called MyChart.  Sign up information is provided on this After Visit Summary.  MyChart is used to connect with patients for Virtual Visits (Telemedicine).  Patients are able to view lab/test results, encounter notes, upcoming appointments, etc.  Non-urgent messages can be sent to your provider as well.   To learn more about what you can do with MyChart, go to [email or web address].   Other Instructions

## 2024-09-19 ENCOUNTER — Ambulatory Visit: Payer: Self-pay | Admitting: Internal Medicine

## 2024-09-19 ENCOUNTER — Telehealth: Payer: Self-pay

## 2024-09-19 LAB — LIPID PANEL
Chol/HDL Ratio: 2.9 ratio (ref 0.0–5.0)
Cholesterol, Total: 149 mg/dL (ref 100–199)
HDL: 51 mg/dL (ref >39)
LDL Chol Calc (NIH): 68 mg/dL (ref 0–99)
Triglycerides: 178 mg/dL — ABNORMAL HIGH (ref 0–149)
VLDL Cholesterol Cal: 30 mg/dL (ref 5–40)

## 2024-09-19 LAB — VITAMIN B12: Vitamin B-12: 851 pg/mL (ref 232–1245)

## 2024-09-19 LAB — LDL CHOLESTEROL, DIRECT: LDL Direct: 76 mg/dL (ref 0–99)

## 2024-09-19 NOTE — Telephone Encounter (Signed)
 Returned call to patient as requested by voice mail message.  He reports he attempted to send remote transmission last night and the monitor's lights all flashed at once and continuously beeped.  Advised to call [employer] for assistance and will help him troubleshoot the monitor.

## 2024-09-19 NOTE — Telephone Encounter (Signed)
 Received voice mail message that tech services assisted in troubleshooting home monitor and it is now working.    Attempted call back x 2 to confirm report was received but no answer.

## 2024-09-20 ENCOUNTER — Ambulatory Visit: Admitting: Sleep Medicine

## 2024-09-20 NOTE — Telephone Encounter (Signed)
Results given over the phone

## 2024-09-21 ENCOUNTER — Ambulatory Visit (INDEPENDENT_AMBULATORY_CARE_PROVIDER_SITE_OTHER): Admitting: Sleep Medicine

## 2024-09-21 ENCOUNTER — Encounter: Payer: Self-pay | Admitting: Sleep Medicine

## 2024-09-21 VITALS — BP 100/60 | HR 69 | Temp 98.3°F | Ht 69.0 in | Wt 256.0 lb

## 2024-09-21 DIAGNOSIS — G4733 Obstructive sleep apnea (adult) (pediatric): Secondary | ICD-10-CM | POA: Diagnosis not present

## 2024-09-21 DIAGNOSIS — E119 Type 2 diabetes mellitus without complications: Secondary | ICD-10-CM | POA: Diagnosis not present

## 2024-09-21 DIAGNOSIS — Z87891 Personal history of nicotine dependence: Secondary | ICD-10-CM

## 2024-09-21 NOTE — Progress Notes (Signed)
 Name:Charles [name] MRN: [id number] DOB: Apr 20, 1946   CHIEF COMPLAINT:  PAP F/U   HISTORY OF PRESENT ILLNESS: Charles [name] is a 78 y.o. w/ a h/o OSA, DMII, CAD, hyperlipidemia and obesity who presents for PAP follow up visit. Reports difficulty using BiPAP therapy every night due to rain out and the tubing not being long enough.   EPWORTH SLEEP SCORE 20    06/18/2024   10:00 AM  Results of the Epworth flowsheet  Sitting and reading 3  Watching TV 3  Sitting, inactive in a public place (e.g. a theatre or a meeting) 2  As a passenger in a car for an hour without a break 3  Lying down to rest in the afternoon when circumstances permit 3  Sitting and talking to someone 2  Sitting quietly after a lunch without alcohol 3  In a car, while stopped for a few minutes in traffic 1  Total score 20    PAST MEDICAL HISTORY :   has a past medical history of Acute cystitis with hematuria (12/23/2016), Acute on chronic systolic and diastolic heart failure, NYHA class 1 (03/31/2018), Allergy, Arthritis, Asthma, Atrial fibrillation with RVR (10/10/2017), Back pain due to injury (03/31/2018), Bacteremia (12/23/2016), 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), Cardiomyopathy, ischemic, Carpal tunnel syndrome, Cataract, Cerebrovascular disease, CHF exacerbation (03/31/2018), Chronic bronchitis, Chronic combined systolic and diastolic CHF (congestive heart failure), Chronic pain syndrome, Chronic venous insufficiency, CKD (chronic kidney disease), stage III (01/01/2017), Complication of anesthesia, Constipation (01/03/2023), Coronary artery disease involving native coronary artery of native heart, Diabetic peripheral neuropathy (03/21/2017), Elevated creatine kinase level (2018), Essential hypertension, Gastroesophageal reflux disease without esophagitis (02/05/2016), Hearing loss, Helicobacter pylori gastritis (2016), History of adenomatous polyp of colon (02/18/2010), History of blood transfusion (~  2015), History of kidney stones, Hyperlipidemia, Iron deficiency anemia, Kidney stone (01/01/2017), Left bundle branch block (LBBB), Leg edema, Long term (current) use of anticoagulants (03/10/2013), Mild cognitive impairment of uncertain or unknown etiology (03/19/2024), Nail dystrophy (03/21/2017), Nephrolithiasis, NSTEMI (non-ST elevated myocardial infarction) (07/28/2016), Obesity (01/03/2008), Obstructive sleep apnea, Pain in right knee (03/13/2018), Pain in right shoulder (03/13/2018), Pain of left hand (05/23/2018), Pain of left hip joint (09/08/2022), Pneumonia (03/2018), Pressure ulcer (04/04/2018), Pyohydronephrosis (01/01/2017), Rotator cuff tear (last 2 years), 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  has a past surgical history that includes Small intestine surgery (1962); Cystoscopy w/ ureteral stent placement (Left, 06/16/2009; 06/26/2009); Esophagogastroduodenoscopy (09/2015); EUS (N/A, 02/06/2016); Cholecystectomy (02/09/2016); Umbilical hernia repair (01/2016); Knee arthroscopy (Bilateral); Knee cartilage surgery (Left, 1980); Cardiac catheterization; Cardiac catheterization (N/A, 07/29/2016); Cardiac catheterization (N/A, 07/29/2016); Cardiac catheterization (N/A, 09/08/2016); Cardiac catheterization (N/A, 09/08/2016); TEE without cardioversion (N/A, 09/15/2016); Cystoscopy w/ ureteral stent placement (Right, 01/01/2017); Coronary angioplasty with stent (1998 & 2008); Coronary angioplasty with stent; Coronary angioplasty (07/28/2016); Coronary artery bypass graft (N/A, 09/15/2016); Appendectomy (1962); Ureteroscopy with holmium laser lithotripsy (Right, 01/06/2017); Colonoscopy; Upper gastrointestinal endoscopy; Insert / replace / remove pacemaker (08/2016); Carpal  tunnel release (Left, 07/26/2018); Carpal tunnel release (Right, 09/13/2018); BIV PACEMAKER INSERTION CRT-P (N/A, 08/14/2019); Cardioversion (N/A, 02/17/2022); and Cardioversion (N/A, 04/23/2022). Prior to Admission medications   Medication Sig Start Date End Date Taking? Authorizing Provider  acetaminophen  (TYLENOL ) 650 MG CR tablet Take 1,300 mg by mouth every 8 (eight) hours as needed (Arthritis).   Yes [provider]  albuterol  (  VENTOLIN  HFA) 108 (90 Base) MCG/ACT inhaler Inhale into the lungs every 6 (six) hours as needed for wheezing or shortness of breath.   Yes [provider]  alfuzosin  (UROXATRAL ) 10 MG 24 hr tablet Take 10 mg by mouth daily with breakfast.   Yes [provider]  aspirin  EC 81 MG tablet Take 1 tablet (81 mg total) by mouth at bedtime. 06/13/24  Yes Meng, Hao, PA  bismuth subsalicylate (PEPTO BISMOL) 262 MG/15ML suspension Take 30 mLs by mouth every 6 (six) hours as needed for indigestion or diarrhea or loose stools.   Yes [provider]  COLLAGEN PO Take 2,500 mg by mouth in the morning. With vitamin C   Yes [provider]  Continuous Glucose Receiver (DEXCOM G7 RECEIVER) DEVI 1 Device by Does not apply route continuous. 01/26/24  Yes Thapa, Sudan, MD  Continuous Glucose Sensor (DEXCOM G7 SENSOR) MISC APPLY 1 SENSOR FOR 10 DAYS, THEN REMOVE AND REPLACE 07/02/24  Yes Thapa, Sudan, MD  cyanocobalamin 1000 MCG tablet Take 1,000 mcg by mouth in the morning.   Yes [provider]  docusate sodium  (COLACE) 100 MG capsule TAKE 1 CAPSULE (100 MG TOTAL) BY MOUTH EVERY 12 (TWELVE) HOURS. AS NEEDED 07/11/24  Yes Avram Lupita BRAVO, MD  erythromycin ophthalmic ointment SMARTSIG:1-2 In Eye(s) 4 Times Daily 05/30/24  Yes [provider]  ezetimibe  (ZETIA ) 10 MG tablet TAKE 1 TABLET BY MOUTH EVERY DAY 01/02/24  Yes Thapa, Sudan, MD  fluticasone (FLONASE) 50 MCG/ACT nasal spray Place 1 spray into both nostrils daily as needed for allergies or  rhinitis.   Yes [provider]  furosemide  (LASIX ) 40 MG tablet Take 1 tablet (40 mg total) by mouth daily as needed for fluid or edema. 07/08/23  Yes Burnard Debby LABOR, MD  gabapentin  (NEURONTIN ) 300 MG capsule Take 1 capsule (300 mg total) by mouth 3 (three) times daily. TAKE 1 CAPSULE BY MOUTH THREE TIMES A DAY Patient taking differently: Take 300 mg by mouth daily as needed. TAKE 1 CAPSULE BY MOUTH ONCE A DAY 01/09/24  Yes Thapa, Sudan, MD  Glucagon  (GVOKE HYPOPEN  2-PACK) 1 MG/0.2ML SOAJ Inject to treat severe low blood sugars 06/30/22  Yes Von Pacific, MD  insulin  aspart (NOVOLOG ) 100 UNIT/ML injection INJECT 5-15 UNITS UNDER THE SKIN THREE TIMES DAILY BEFORE MEALS. 04/19/24  Yes Thapa, Sudan, MD  insulin  glargine (LANTUS  SOLOSTAR) 100 UNIT/ML Solostar Pen Inject 30 Units into the skin daily. Patient taking differently: Inject 30 Units into the skin in the morning. 04/19/24  Yes Thapa, Sudan, MD  Insulin  Pen Needle (BD ULTRA-FINE PEN NEEDLES) 29G X 12.7MM MISC USE DAILY TO INJECT TRESIBA  INSULIN  02/03/24  Yes Thapa, Sudan, MD  Insulin  Syringe-Needle U-100 (BD INSULIN  SYRINGE U/F) 30G X 1/2 0.5 ML MISC USE TO INJECT INSULIN  3 TIMES DAILY 02/03/24  Yes Thapa, Sudan, MD  Lancets Misc. (ACCU-CHEK SOFTCLIX LANCET DEV) KIT by Does not apply route.   Yes [provider]  latanoprost (XALATAN) 0.005 % ophthalmic solution Place 1 drop into both eyes at bedtime. 08/17/22  Yes [provider]  metoprolol  tartrate (LOPRESSOR ) 25 MG tablet Take 0.5 tablets (12.5 mg total) by mouth 2 (two) times daily. 06/26/24  Yes Adams, Zane, PA-C  metroNIDAZOLE  (METROCREAM ) 0.75 % cream Apply topically 2 (two) times daily as needed (for rosecea).   Yes [provider]  nitroGLYCERIN  (NITROSTAT ) 0.4 MG SL tablet PLACE 1 TABLET UNDER THE TONGUE EVERY 5 MINUTES AS NEEDED FOR CHEST PAIN Patient taking  differently: Place 0.4 mg under the tongue every 5 (five) minutes as needed for chest pain. 05/10/22   Yes Burnard Debby LABOR, MD  nystatin-triamcinolone  ointment (MYCOLOG) Apply 1 Application topically 2 (two) times daily.   Yes [provider]  ondansetron  (ZOFRAN ) 4 MG tablet Take 4 mg by mouth every 8 (eight) hours as needed for nausea or vomiting.   Yes [provider]  polyethylene glycol (MIRALAX  / GLYCOLAX ) 17 g packet Take 17 g by mouth 2 (two) times daily as needed for severe constipation. 06/02/23  Yes Mesner, Selinda, MD  promethazine  (PHENERGAN ) 25 MG tablet Take 1 tablet by mouth every 8 (eight) hours as needed for nausea. 06/25/19  Yes [provider]  rivaroxaban  (XARELTO ) 20 MG TABS tablet Take 1 tablet (20 mg total) by mouth daily before breakfast. 06/13/24  Yes Meng, Hao, PA  triamcinolone  cream (KENALOG) 0.1 % Apply 1 application. topically 2 (two) times daily as needed (rash). 12/07/21  Yes [provider]  zinc  gluconate 50 MG tablet Take 50 mg by mouth in the morning.   Yes [provider]   Allergies  Allergen Reactions   Black Pepper [Piper] Other (See Comments)    Irritates back of throat   Chocolate Hives, Shortness Of Breath and Swelling    Dark chocolate   Codeine  Itching and Swelling   Oxytetracycline Other (See Comments)    Flushing in sunlight   Statins Other (See Comments)    Mental changes, muscle aches   Flavoring Agent Other (See Comments)   Latex Itching and Other (See Comments)    Sensitive skin   Other Other (See Comments)    Opioids mess up his Stomach   Penicillins    Tape Rash and Other (See Comments)    SKIN IS VERY SENSITIVE!!    FAMILY HISTORY:  family history includes Arthritis in his sister; Dementia (age of onset: 28) in his mother; Epilepsy in his brother; Heart attack (age of onset: 55) in his mother; Lung cancer in his maternal grandfather; Neuropathy in his brother; Stroke in his maternal grandmother and paternal grandfather; Tremor in his maternal aunt and mother. SOCIAL HISTORY:  reports that he  quit smoking about 49 years ago. His smoking use included cigarettes. He started smoking about 54 years ago. He has a 5 pack-year smoking history. He has never used smokeless tobacco. He reports that he does not drink alcohol and does not use drugs.   Review of Systems:  Gen:  Denies  fever, sweats, chills weight loss  HEENT: Denies blurred vision, double vision, ear pain, eye pain, hearing loss, nose bleeds, sore throat Cardiac:  No dizziness, chest pain or heaviness, chest tightness,edema, No JVD Resp:   No cough, -sputum production, -shortness of breath,-wheezing, -hemoptysis,  Gi: Denies swallowing difficulty, stomach pain, nausea or vomiting, diarrhea, constipation, bowel incontinence Gu:  Denies bladder incontinence, burning urine Ext:   Denies Joint pain, stiffness or swelling Skin: Denies  skin rash, easy bruising or bleeding or hives Endoc:  Denies polyuria, polydipsia , polyphagia or weight change Psych:   Denies depression, insomnia or hallucinations  Other:  All other systems negative  VITAL SIGNS: BP 100/60   Pulse 69   Temp 98.3 F (36.8 C)   Ht 5' 9 (1.753 m)   Wt 256 lb (116.1 kg)   SpO2 94%   BMI 37.80 kg/m    Physical Examination:   General Appearance: No distress  EYES PERRLA, EOM intact.   NECK Supple, No  JVD Pulmonary: normal breath sounds, No wheezing.  CardiovascularNormal S1,S2.  No m/r/g.   Abdomen: Benign, Soft, non-tender. Skin:   warm, no rashes, no ecchymosis  Extremities: normal, no cyanosis, clubbing. Neuro:without focal findings,  speech normal  PSYCHIATRIC: Mood, affect within normal limits.   ASSESSMENT AND PLAN  OSA Patient is using and benefiting from PAP therapy. Counseled patient on proper mask fit and also adjusted PAP setting. Will also send order for CPAP mask desensitization. Discussed the consequences of untreated sleep apnea. Advised not to drive drowsy for safety of patient and others. Will follow up in 2 months.     DMII Stable, on current management. Following with PCP.    Patient  satisfied with Plan of action and management. All questions answered  I spent a total of 38 minutes reviewing chart data, face-to-face evaluation with the patient, counseling and coordination of care as detailed above.    Jodie Cavey, M.D.  Sleep Medicine [facility]

## 2024-09-24 ENCOUNTER — Other Ambulatory Visit: Payer: Self-pay | Admitting: Physician Assistant

## 2024-09-24 DIAGNOSIS — I480 Paroxysmal atrial fibrillation: Secondary | ICD-10-CM

## 2024-09-25 MED ORDER — RIVAROXABAN 20 MG PO TABS: 20.0000 mg | ORAL_TABLET | Freq: Every day | ORAL | 1 refills | Status: AC

## 2024-10-02 ENCOUNTER — Ambulatory Visit (HOSPITAL_BASED_OUTPATIENT_CLINIC_OR_DEPARTMENT_OTHER)

## 2024-10-02 ENCOUNTER — Ambulatory Visit (HOSPITAL_BASED_OUTPATIENT_CLINIC_OR_DEPARTMENT_OTHER): Attending: Sleep Medicine | Admitting: *Deleted

## 2024-10-02 ENCOUNTER — Telehealth: Payer: Self-pay

## 2024-10-02 DIAGNOSIS — G4733 Obstructive sleep apnea (adult) (pediatric): Secondary | ICD-10-CM

## 2024-10-02 NOTE — Telephone Encounter (Signed)
 Received call from Rock Patient, spouse per [facility] and she asked if the Merlin report was received at the end of October.  She stated there was a problem with the machine had gotten disconnected but wanted to make sure it is now working. Advised received the report and machine is working fine at this time.  Advised next remote transmission will be 10/22/2024 and call back for any further questions.

## 2024-10-04 ENCOUNTER — Ambulatory Visit: Admitting: Internal Medicine

## 2024-10-22 ENCOUNTER — Telehealth: Payer: Self-pay

## 2024-10-22 ENCOUNTER — Ambulatory Visit: Attending: Cardiovascular Disease

## 2024-10-22 DIAGNOSIS — Z950 Presence of cardiac pacemaker: Secondary | ICD-10-CM | POA: Diagnosis not present

## 2024-10-22 DIAGNOSIS — I5042 Chronic combined systolic (congestive) and diastolic (congestive) heart failure: Secondary | ICD-10-CM

## 2024-10-22 NOTE — Telephone Encounter (Signed)
Remote ICM transmission received.  Attempted call to patient regarding ICM remote transmission and mail box is full. 

## 2024-10-22 NOTE — Progress Notes (Signed)
 EPIC Encounter for ICM Monitoring  Patient Name: Charles [name] is a 78 y.o. male Date: 10/22/2024 Primary Care Physican: Montey Lot, PA-C Primary Cardiologist: [facility] Electrophysiologist: Me[facility] Bi-V Pacing: 82% 03/30/2023 Office weight: 280 lbs 05/24/2023 Weight:  271 lbs 01/20/2024 Office Weight: 275 lbs 03/12/2024 Office Weight: 269 lbs 06/13/2024 Office Weight: 254.9 lbs 09/18/2024 Office Weigh: 255 lbs   AT/AF Burden:  <1% (taking Xarelto )                                                           TAttempted call to patient and unable to reach.    Transmission results reviewed.    Since 09/17/2024 ICM Remote Transmission:  CorVue thoracic impedance suggesting possible fluid accumulation from 10/02/2024-10/12/2024   Prescribed: Furosemide  40 mg take 1 tablet (40 mg total) by mouth daily as needed   Labs: 06/03/2024 Creatinine 1.01, BUN 18, Potassium 3.7, Sodium 136, GFR >60  06/03/2024 Creatinine 1.18, BUN 22, Potassium 3.9, Sodium 134, GFR >60  06/02/2024 Creatinine 1.07, BUN 21, Potassium 3.6, Sodium 138, GFR >60  06/01/2024 Creatinine 1.34, BUN 24, Potassium 3.6, Sodium 140, GFR 55 05/11/2024 Creatinine 1.11, BUN 21, Potassium 4.7, Sodium 138 A complete set of results can be found in Results Review.   Recommendations: Unable to reach.     Follow-up plan: ICM clinic phone appointment on 12/03/2024.  91 day device clinic remote transmission 11/19/2024.   EP/Cardiology Office Visits:   09/18/2024 with Dr Mona.    Recall 01/23/2025 with EP APP   Copy of ICM check sent to Dr. Nancey.    Remote monitoring is medically necessary for Heart Failure Management.    Daily Thoracic Impedance ICM trend: 07/23/2024 through 10/22/2024.    12-14 Month Thoracic Impedance ICM trend:     Mitzie GORMAN Garner, RN 10/22/2024 4:51 PM

## 2024-10-31 ENCOUNTER — Encounter: Payer: Self-pay | Admitting: Endocrinology

## 2024-10-31 ENCOUNTER — Ambulatory Visit: Payer: Self-pay | Admitting: Endocrinology

## 2024-10-31 ENCOUNTER — Telehealth: Payer: Self-pay | Admitting: Neurology

## 2024-10-31 ENCOUNTER — Ambulatory Visit (INDEPENDENT_AMBULATORY_CARE_PROVIDER_SITE_OTHER): Admitting: Endocrinology

## 2024-10-31 VITALS — BP 118/62 | HR 76 | Resp 16 | Ht 69.0 in | Wt 254.2 lb

## 2024-10-31 DIAGNOSIS — Z794 Long term (current) use of insulin: Secondary | ICD-10-CM

## 2024-10-31 DIAGNOSIS — E118 Type 2 diabetes mellitus with unspecified complications: Secondary | ICD-10-CM

## 2024-10-31 LAB — POCT GLYCOSYLATED HEMOGLOBIN (HGB A1C): Hemoglobin A1C: 6.6 % — AB (ref 4.0–5.6)

## 2024-10-31 MED ORDER — LANTUS SOLOSTAR 100 UNIT/ML ~~LOC~~ SOPN: 30.0000 [IU] | PEN_INJECTOR | Freq: Every day | SUBCUTANEOUS | 4 refills | Status: AC

## 2024-10-31 NOTE — Telephone Encounter (Signed)
 Pt's wife Rock  and pt came in this afternoon and they scheduled an appointment for  April. Rock stated that Dr. Isaias told Pt to take 1 tablet  a day for 2 to 3  weeks and then stop completed with  the  Gabapentin . Rock is concern with side effect with this medicine. Please call Rock for clarification. Thanks

## 2024-10-31 NOTE — Telephone Encounter (Signed)
 From Dr. Isaias notes Patient reports he had been off of gabapentin  in the past with no worsening numbness and tingling of the feet. He would like to stop gabapentin . He has been currently taking 2 times a day, advised to take gabapentin  once a day for 2 to 3 weeks and then stop. Also discussed about follow-up with neurology for cognitive decline, memory issues and hallucination.
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
## 2024-11-01 NOTE — Telephone Encounter (Signed)
 Called left voicemail message.

## 2024-11-06 ENCOUNTER — Other Ambulatory Visit: Payer: Self-pay

## 2024-11-06 NOTE — Telephone Encounter (Signed)
 Request from [facility] to change from dexcom to accuchek or true metrix

## 2024-11-07 MED ORDER — ACCU-CHEK GUIDE W/DEVICE KIT: 1.0000 | PACK | Freq: Four times a day (QID) | 0 refills | Status: AC

## 2024-11-07 MED ORDER — ACCU-CHEK SOFTCLIX LANCETS MISC: 1.0000 | Freq: Four times a day (QID) | 3 refills | Status: AC

## 2024-11-19 ENCOUNTER — Ambulatory Visit: Payer: Medicare Other

## 2024-11-19 DIAGNOSIS — I5042 Chronic combined systolic (congestive) and diastolic (congestive) heart failure: Secondary | ICD-10-CM | POA: Diagnosis not present

## 2024-11-20 LAB — CUP PACEART REMOTE DEVICE CHECK
Battery Remaining Longevity: 31 mo
Battery Remaining Percentage: 31 %
Battery Voltage: 2.95 V
Brady Statistic AP VP Percent: 81 %
Brady Statistic AP VS Percent: 4.8 %
Brady Statistic AS VP Percent: 1.6 %
Brady Statistic AS VS Percent: 12 %
Brady Statistic RA Percent Paced: 81 %
Date Time Interrogation Session: 20251229020017
Implantable Lead Connection Status: 753985
Implantable Lead Connection Status: 753985
Implantable Lead Connection Status: 753985
Implantable Lead Implant Date: 20091007
Implantable Lead Implant Date: 20091007
Implantable Lead Implant Date: 20200922
Implantable Lead Location: 753858
Implantable Lead Location: 753859
Implantable Lead Location: 753860
Implantable Pulse Generator Implant Date: 20200922
Lead Channel Impedance Value: 1150 Ohm
Lead Channel Impedance Value: 450 Ohm
Lead Channel Impedance Value: 610 Ohm
Lead Channel Pacing Threshold Amplitude: 0.75 V
Lead Channel Pacing Threshold Amplitude: 0.875 V
Lead Channel Pacing Threshold Amplitude: 1.375 V
Lead Channel Pacing Threshold Pulse Width: 0.4 ms
Lead Channel Pacing Threshold Pulse Width: 0.4 ms
Lead Channel Pacing Threshold Pulse Width: 0.5 ms
Lead Channel Sensing Intrinsic Amplitude: 12 mV
Lead Channel Sensing Intrinsic Amplitude: 3.5 mV
Lead Channel Setting Pacing Amplitude: 1.75 V
Lead Channel Setting Pacing Amplitude: 2 V
Lead Channel Setting Pacing Amplitude: 2.375
Lead Channel Setting Pacing Pulse Width: 0.4 ms
Lead Channel Setting Pacing Pulse Width: 0.5 ms
Lead Channel Setting Sensing Sensitivity: 2 mV
Pulse Gen Model: 3562
Pulse Gen Serial Number: 9157656

## 2024-11-21 ENCOUNTER — Ambulatory Visit: Payer: Self-pay | Admitting: Cardiovascular Disease

## 2024-11-27 NOTE — Progress Notes (Signed)
 Remote PPM Transmission

## 2024-11-28 ENCOUNTER — Other Ambulatory Visit (INDEPENDENT_AMBULATORY_CARE_PROVIDER_SITE_OTHER)

## 2024-11-28 ENCOUNTER — Ambulatory Visit: Admitting: Internal Medicine

## 2024-11-28 ENCOUNTER — Encounter: Payer: Self-pay | Admitting: Internal Medicine

## 2024-11-28 VITALS — BP 126/84 | HR 71 | Ht 69.0 in | Wt 244.0 lb

## 2024-11-28 DIAGNOSIS — R194 Change in bowel habit: Secondary | ICD-10-CM

## 2024-11-28 DIAGNOSIS — R10814 Left lower quadrant abdominal tenderness: Secondary | ICD-10-CM

## 2024-11-28 DIAGNOSIS — R110 Nausea: Secondary | ICD-10-CM

## 2024-11-28 LAB — COMPREHENSIVE METABOLIC PANEL WITH GFR
ALT: 31 U/L (ref 3–53)
AST: 20 U/L (ref 5–37)
Albumin: 4.3 g/dL (ref 3.5–5.2)
Alkaline Phosphatase: 62 U/L (ref 39–117)
BUN: 25 mg/dL — ABNORMAL HIGH (ref 6–23)
CO2: 24 meq/L (ref 19–32)
Calcium: 9.1 mg/dL (ref 8.4–10.5)
Chloride: 104 meq/L (ref 96–112)
Creatinine, Ser: 1.23 mg/dL (ref 0.40–1.50)
GFR: 56.46 mL/min — ABNORMAL LOW
Glucose, Bld: 194 mg/dL — ABNORMAL HIGH (ref 70–99)
Potassium: 4.4 meq/L (ref 3.5–5.1)
Sodium: 136 meq/L (ref 135–145)
Total Bilirubin: 0.6 mg/dL (ref 0.2–1.2)
Total Protein: 7.2 g/dL (ref 6.0–8.3)

## 2024-11-28 LAB — CBC WITH DIFFERENTIAL/PLATELET
Basophils Absolute: 0 K/uL (ref 0.0–0.1)
Basophils Relative: 0.4 % (ref 0.0–3.0)
Eosinophils Absolute: 0.1 K/uL (ref 0.0–0.7)
Eosinophils Relative: 1.3 % (ref 0.0–5.0)
HCT: 41.9 % (ref 39.0–52.0)
Hemoglobin: 14.2 g/dL (ref 13.0–17.0)
Lymphocytes Relative: 12.4 % (ref 12.0–46.0)
Lymphs Abs: 1 K/uL (ref 0.7–4.0)
MCHC: 33.9 g/dL (ref 30.0–36.0)
MCV: 90.6 fl (ref 78.0–100.0)
Monocytes Absolute: 0.4 K/uL (ref 0.1–1.0)
Monocytes Relative: 5.5 % (ref 3.0–12.0)
Neutro Abs: 6.4 K/uL (ref 1.4–7.7)
Neutrophils Relative %: 80.4 % — ABNORMAL HIGH (ref 43.0–77.0)
Platelets: 211 K/uL (ref 150.0–400.0)
RBC: 4.62 Mil/uL (ref 4.22–5.81)
RDW: 13.7 % (ref 11.5–15.5)
WBC: 7.9 K/uL (ref 4.0–10.5)

## 2024-11-28 NOTE — Patient Instructions (Addendum)
 Your provider has requested that you go to the basement level for lab work before leaving today. Press B on the elevator. The lab is located at the first door on the left as you exit the elevator.  Due to recent changes in healthcare laws, you may see the results of your imaging and laboratory studies on MyChart before your provider has had a chance to review them.  We understand that in some cases there may be results that are confusing or concerning to you. Not all laboratory results come back in the same time frame and the provider may be waiting for multiple results in order to interpret others.  Please give us  48 hours in order for your provider to thoroughly review all the results before contacting the office for clarification of your results.   You have been scheduled for a CT scan of the abdomen and pelvis at [facility] ED Radiology. You are scheduled on 12/02/2024 at 10:30am. You should arrive 2 hours prior to your appointment time for registration and drinking the contrast. Please follow the written instructions below on the day of your exam:    Do not eat anything after 6:30am (4 hours prior to your test)    You may take any medications as prescribed with a small amount of water, if necessary. If you take any of the following medications: METFORMIN , GLUCOPHAGE , GLUCOVANCE, AVANDAMET, RIOMET , FORTAMET , ACTOPLUS MET, JANUMET, GLUMETZA  or METAGLIP, you MAY be asked to HOLD this medication 48 hours AFTER the exam.   The purpose of you drinking the oral contrast is to aid in the visualization of your intestinal tract. The contrast solution may cause some diarrhea. Depending on your individual set of symptoms, you may also receive an intravenous injection of x-ray contrast/dye. If you have any questions regarding your exam or if you need to reschedule, you may call  Radiology at [phone] between the hours of 8:00 am and 5:00 pm, Monday-Friday.   I appreciate the opportunity to care for  you. Lupita Commander, MD, [facility]
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
## 2024-11-29 ENCOUNTER — Ambulatory Visit: Payer: Self-pay | Admitting: Internal Medicine

## 2024-11-30 ENCOUNTER — Other Ambulatory Visit: Payer: Self-pay | Admitting: Internal Medicine

## 2024-12-02 ENCOUNTER — Ambulatory Visit (HOSPITAL_BASED_OUTPATIENT_CLINIC_OR_DEPARTMENT_OTHER)
Admission: RE | Admit: 2024-12-02 | Discharge: 2024-12-02 | Disposition: A | Source: Ambulatory Visit | Attending: Internal Medicine | Admitting: Internal Medicine

## 2024-12-02 DIAGNOSIS — R10814 Left lower quadrant abdominal tenderness: Secondary | ICD-10-CM | POA: Diagnosis present

## 2024-12-02 DIAGNOSIS — R110 Nausea: Secondary | ICD-10-CM | POA: Insufficient documentation

## 2024-12-02 LAB — POCT I-STAT CREATININE: Creatinine, Ser: 1.3 mg/dL — ABNORMAL HIGH (ref 0.61–1.24)

## 2024-12-02 MED ORDER — IOHEXOL 300 MG/ML  SOLN
100.0000 mL | Freq: Once | INTRAMUSCULAR | Status: AC | PRN
  Administered 2024-12-02: 100 mL via INTRAVENOUS

## 2024-12-03 ENCOUNTER — Ambulatory Visit: Attending: Cardiovascular Disease

## 2024-12-03 ENCOUNTER — Telehealth: Payer: Self-pay | Admitting: Cardiology

## 2024-12-03 DIAGNOSIS — Z950 Presence of cardiac pacemaker: Secondary | ICD-10-CM

## 2024-12-03 DIAGNOSIS — I5042 Chronic combined systolic (congestive) and diastolic (congestive) heart failure: Secondary | ICD-10-CM | POA: Diagnosis not present

## 2024-12-03 NOTE — Telephone Encounter (Signed)
 Paper Work Dropped Off: [employer]  Date: 12/03/2024  Location of paper:  Provider Mailbox

## 2024-12-05 NOTE — Progress Notes (Signed)
 EPIC Encounter for ICM Monitoring  Patient Name: Charles [name] is a 79 y.o. male Date: 12/05/2024 Primary Care Physican: Montey Lot, PA-C Primary Cardiologist: [facility] Electrophysiologist: [facility] Bi-V Pacing: 82% 03/30/2023 Office weight: 280 lbs 05/24/2023 Weight:  271 lbs 01/20/2024 Office Weight: 275 lbs 03/12/2024 Office Weight: 269 lbs 06/13/2024 Office Weight: 254.9 lbs 09/18/2024 Office Weigh: 255 lbs   AT/AF Burden:  <1% (taking Xarelto )                                                           Transmission results reviewed.    Since 10/22/2024 ICM Remote Transmission:  CorVue thoracic impedance suggesting normal fluid levels.   Prescribed: Furosemide  40 mg take 1 tablet (40 mg total) by mouth daily as needed   Labs: 11/28/2024 Creatinine 1.23, BUN 25, Potassium 4.4, Sodium 136, GFR 56.46 06/03/2024 Creatinine 1.01, BUN 18, Potassium 3.7, Sodium 136, GFR >60  06/03/2024 Creatinine 1.18, BUN 22, Potassium 3.9, Sodium 134, GFR >60  06/02/2024 Creatinine 1.07, BUN 21, Potassium 3.6, Sodium 138, GFR >60  06/01/2024 Creatinine 1.34, BUN 24, Potassium 3.6, Sodium 140, GFR 55 05/11/2024 Creatinine 1.11, BUN 21, Potassium 4.7, Sodium 138 A complete set of results can be found in Results Review.   Recommendations:  No changes.   Follow-up plan: ICM clinic phone appointment on 01/03/2025.  91 day device clinic remote transmission 02/18/2025.   EP/Cardiology Office Visits:       Recall 01/23/2025 with EP APP   Copy of ICM check sent to Dr. Nancey.     Remote monitoring is medically necessary for Heart Failure Management.    Daily Thoracic Impedance ICM trend: 09/05/2024 through 12/04/2024.    12-14 Month Thoracic Impedance ICM trend:     Mitzie GORMAN Garner, RN 12/05/2024 1:04 PM

## 2024-12-10 NOTE — Telephone Encounter (Signed)
 Signed copy in chart

## 2024-12-10 NOTE — Telephone Encounter (Signed)
 Patient wife returning call. Please advise, thank you

## 2024-12-11 ENCOUNTER — Telehealth: Payer: Self-pay | Admitting: Internal Medicine

## 2024-12-11 NOTE — Telephone Encounter (Signed)
 PT has not had a BM in 3 days and is very concerned. Requesting to speak with a nurse

## 2024-12-11 NOTE — Telephone Encounter (Signed)
 Signed forms faxed successfully to [employer].

## 2024-12-11 NOTE — Telephone Encounter (Signed)
 Called the patient. No answer. Left message. Also, we have not been successful in reaching him to review the recent CT.

## 2024-12-13 NOTE — Telephone Encounter (Signed)
 Left message to call back

## 2024-12-14 NOTE — Telephone Encounter (Signed)
 Following several failed attempts to reach patient by phone, will send a letter via [employer] with CT results/recommendations.

## 2024-12-20 NOTE — Progress Notes (Signed)
 31 day ICM Remote transmission canceled due to [facility] clinic is on hold until further notice.  91 day remote monitoring will continue per protocol.

## 2024-12-20 NOTE — Telephone Encounter (Signed)
 Spoke with the patient and spouse. They had confused the instruction for his bowel regimen. Reviewed it. He will begin daily Miralax  and in addition take Dulcolax every other day. Call with any problems or concerns.

## 2024-12-24 ENCOUNTER — Ambulatory Visit: Admitting: Sleep Medicine

## 2024-12-24 ENCOUNTER — Other Ambulatory Visit: Payer: Self-pay | Admitting: Internal Medicine

## 2024-12-26 ENCOUNTER — Telehealth: Payer: Self-pay | Admitting: Internal Medicine

## 2024-12-26 ENCOUNTER — Other Ambulatory Visit: Payer: Self-pay | Admitting: Endocrinology

## 2024-12-26 DIAGNOSIS — E118 Type 2 diabetes mellitus with unspecified complications: Secondary | ICD-10-CM

## 2024-12-26 MED ORDER — NITROGLYCERIN 0.4 MG SL SUBL: 0.4000 mg | SUBLINGUAL_TABLET | SUBLINGUAL | 3 refills | Status: AC | PRN

## 2024-12-26 NOTE — Telephone Encounter (Signed)
" °*  STAT* If patient is at the pharmacy, call can be transferred to refill team.   1. Which medications need to be refilled? (please list name of each medication and dose if known)  nitroGLYCERIN  (NITROSTAT ) 0.4 MG SL tablet  2. Which pharmacy/location (including street and city if local pharmacy) is medication to be sent to? [id number] - [place], [place] - [address] [facility] [address] [facility]   3. Do they need a 30 day or 90 day supply? 30 day   Pt scheduled 4/28  "

## 2024-12-26 NOTE — Telephone Encounter (Signed)
 Refill sent

## 2025-01-03 ENCOUNTER — Ambulatory Visit

## 2025-01-16 ENCOUNTER — Ambulatory Visit: Admitting: Internal Medicine

## 2025-01-30 ENCOUNTER — Ambulatory Visit: Admitting: Endocrinology

## 2025-02-13 ENCOUNTER — Ambulatory Visit: Admitting: Sleep Medicine

## 2025-02-18 ENCOUNTER — Encounter

## 2025-03-14 ENCOUNTER — Ambulatory Visit: Admitting: Neurology

## 2025-03-19 ENCOUNTER — Ambulatory Visit: Admitting: Internal Medicine

## 2025-05-20 ENCOUNTER — Encounter

## 2025-08-19 ENCOUNTER — Encounter

## 2025-11-18 ENCOUNTER — Encounter

## 2026-02-17 ENCOUNTER — Encounter
